# Patient Record
Sex: Female | Born: 1970
Health system: Southern US, Community
[De-identification: ages and names within clinical notes are randomized; demographics above are authoritative.]

## PROBLEM LIST (undated history)

## (undated) DIAGNOSIS — F419 Anxiety disorder, unspecified: Secondary | ICD-10-CM

## (undated) DIAGNOSIS — G629 Polyneuropathy, unspecified: Secondary | ICD-10-CM

## (undated) DIAGNOSIS — J45909 Unspecified asthma, uncomplicated: Secondary | ICD-10-CM

## (undated) DIAGNOSIS — D649 Anemia, unspecified: Secondary | ICD-10-CM

## (undated) DIAGNOSIS — F319 Bipolar disorder, unspecified: Secondary | ICD-10-CM

## (undated) DIAGNOSIS — F329 Major depressive disorder, single episode, unspecified: Secondary | ICD-10-CM

## (undated) DIAGNOSIS — F32A Depression, unspecified: Secondary | ICD-10-CM

## (undated) DIAGNOSIS — I10 Essential (primary) hypertension: Secondary | ICD-10-CM

## (undated) DIAGNOSIS — K219 Gastro-esophageal reflux disease without esophagitis: Secondary | ICD-10-CM

## (undated) DIAGNOSIS — E039 Hypothyroidism, unspecified: Secondary | ICD-10-CM

## (undated) DIAGNOSIS — F259 Schizoaffective disorder, unspecified: Secondary | ICD-10-CM

## (undated) HISTORY — PX: TUBAL LIGATION: SHX77

## (undated) HISTORY — PX: BREAST SURGERY: SHX581

---
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
## 2001-02-18 ENCOUNTER — Emergency Department (HOSPITAL_COMMUNITY): Admission: EM | Admit: 2001-02-18 | Discharge: 2001-02-18 | Payer: Self-pay | Admitting: *Deleted

## 2001-02-18 ENCOUNTER — Encounter: Payer: Self-pay | Admitting: *Deleted

## 2001-02-23 ENCOUNTER — Encounter: Payer: Self-pay | Admitting: *Deleted

## 2001-02-23 ENCOUNTER — Emergency Department (HOSPITAL_COMMUNITY): Admission: EM | Admit: 2001-02-23 | Discharge: 2001-02-23 | Payer: Self-pay | Admitting: *Deleted

## 2001-04-01 ENCOUNTER — Ambulatory Visit (HOSPITAL_COMMUNITY): Admission: RE | Admit: 2001-04-01 | Discharge: 2001-04-01 | Payer: Self-pay | Admitting: Unknown Physician Specialty

## 2001-04-01 ENCOUNTER — Encounter: Payer: Self-pay | Admitting: Unknown Physician Specialty

## 2001-04-07 ENCOUNTER — Emergency Department (HOSPITAL_COMMUNITY): Admission: EM | Admit: 2001-04-07 | Discharge: 2001-04-07 | Payer: Self-pay | Admitting: Emergency Medicine

## 2001-04-07 ENCOUNTER — Encounter: Payer: Self-pay | Admitting: Emergency Medicine

## 2001-06-08 ENCOUNTER — Encounter: Payer: Self-pay | Admitting: Neurology

## 2001-06-08 ENCOUNTER — Ambulatory Visit (HOSPITAL_COMMUNITY): Admission: RE | Admit: 2001-06-08 | Discharge: 2001-06-08 | Payer: Self-pay | Admitting: Neurology

## 2001-07-09 ENCOUNTER — Encounter: Payer: Self-pay | Admitting: *Deleted

## 2001-07-09 ENCOUNTER — Emergency Department (HOSPITAL_COMMUNITY): Admission: EM | Admit: 2001-07-09 | Discharge: 2001-07-09 | Payer: Self-pay | Admitting: *Deleted

## 2001-08-25 ENCOUNTER — Ambulatory Visit (HOSPITAL_COMMUNITY): Admission: RE | Admit: 2001-08-25 | Discharge: 2001-08-25 | Payer: Self-pay | Admitting: General Surgery

## 2001-09-06 ENCOUNTER — Emergency Department (HOSPITAL_COMMUNITY): Admission: EM | Admit: 2001-09-06 | Discharge: 2001-09-06 | Payer: Self-pay | Admitting: Emergency Medicine

## 2001-10-24 ENCOUNTER — Emergency Department (HOSPITAL_COMMUNITY): Admission: EM | Admit: 2001-10-24 | Discharge: 2001-10-24 | Payer: Self-pay | Admitting: Internal Medicine

## 2001-10-31 ENCOUNTER — Emergency Department (HOSPITAL_COMMUNITY): Admission: EM | Admit: 2001-10-31 | Discharge: 2001-10-31 | Payer: Self-pay | Admitting: Internal Medicine

## 2001-11-17 ENCOUNTER — Encounter (HOSPITAL_COMMUNITY): Admission: RE | Admit: 2001-11-17 | Discharge: 2001-12-20 | Payer: Self-pay | Admitting: Orthopaedic Surgery

## 2001-12-16 ENCOUNTER — Emergency Department (HOSPITAL_COMMUNITY): Admission: EM | Admit: 2001-12-16 | Discharge: 2001-12-16 | Payer: Self-pay | Admitting: *Deleted

## 2002-01-04 ENCOUNTER — Encounter: Payer: Self-pay | Admitting: Family Medicine

## 2002-01-04 ENCOUNTER — Ambulatory Visit (HOSPITAL_COMMUNITY): Admission: RE | Admit: 2002-01-04 | Discharge: 2002-01-04 | Payer: Self-pay | Admitting: Nurse Practitioner

## 2002-01-11 ENCOUNTER — Encounter: Payer: Self-pay | Admitting: Orthopaedic Surgery

## 2002-01-11 ENCOUNTER — Ambulatory Visit (HOSPITAL_COMMUNITY): Admission: RE | Admit: 2002-01-11 | Discharge: 2002-01-11 | Payer: Self-pay | Admitting: Orthopaedic Surgery

## 2002-01-28 ENCOUNTER — Encounter: Payer: Self-pay | Admitting: Emergency Medicine

## 2002-01-28 ENCOUNTER — Emergency Department (HOSPITAL_COMMUNITY): Admission: EM | Admit: 2002-01-28 | Discharge: 2002-01-28 | Payer: Self-pay | Admitting: Emergency Medicine

## 2002-01-31 ENCOUNTER — Emergency Department (HOSPITAL_COMMUNITY): Admission: EM | Admit: 2002-01-31 | Discharge: 2002-01-31 | Payer: Self-pay | Admitting: Emergency Medicine

## 2002-01-31 ENCOUNTER — Encounter: Payer: Self-pay | Admitting: Emergency Medicine

## 2002-02-07 ENCOUNTER — Ambulatory Visit (HOSPITAL_COMMUNITY): Admission: RE | Admit: 2002-02-07 | Discharge: 2002-02-07 | Payer: Self-pay | Admitting: Internal Medicine

## 2002-02-15 ENCOUNTER — Ambulatory Visit (HOSPITAL_COMMUNITY): Admission: RE | Admit: 2002-02-15 | Discharge: 2002-02-15 | Payer: Self-pay | Admitting: Family Medicine

## 2002-02-15 ENCOUNTER — Encounter: Payer: Self-pay | Admitting: Family Medicine

## 2002-03-25 ENCOUNTER — Ambulatory Visit (HOSPITAL_COMMUNITY): Admission: RE | Admit: 2002-03-25 | Discharge: 2002-03-25 | Payer: Self-pay

## 2002-10-31 ENCOUNTER — Emergency Department (HOSPITAL_COMMUNITY): Admission: EM | Admit: 2002-10-31 | Discharge: 2002-10-31 | Payer: Self-pay | Admitting: Emergency Medicine

## 2003-03-24 ENCOUNTER — Ambulatory Visit (HOSPITAL_COMMUNITY): Admission: RE | Admit: 2003-03-24 | Discharge: 2003-03-24 | Payer: Self-pay | Admitting: Family Medicine

## 2003-04-01 ENCOUNTER — Emergency Department (HOSPITAL_COMMUNITY): Admission: EM | Admit: 2003-04-01 | Discharge: 2003-04-01 | Payer: Self-pay | Admitting: Emergency Medicine

## 2003-04-05 ENCOUNTER — Ambulatory Visit (HOSPITAL_COMMUNITY): Admission: RE | Admit: 2003-04-05 | Discharge: 2003-04-05 | Payer: Self-pay | Admitting: Orthopedic Surgery

## 2003-05-16 ENCOUNTER — Ambulatory Visit (HOSPITAL_COMMUNITY): Admission: RE | Admit: 2003-05-16 | Discharge: 2003-05-16 | Payer: Self-pay | Admitting: Orthopedic Surgery

## 2004-05-29 ENCOUNTER — Ambulatory Visit (HOSPITAL_COMMUNITY): Admission: RE | Admit: 2004-05-29 | Discharge: 2004-05-29 | Payer: Self-pay | Admitting: General Surgery

## 2004-07-01 ENCOUNTER — Emergency Department (HOSPITAL_COMMUNITY): Admission: EM | Admit: 2004-07-01 | Discharge: 2004-07-01 | Payer: Self-pay | Admitting: Emergency Medicine

## 2004-10-29 ENCOUNTER — Emergency Department (HOSPITAL_COMMUNITY): Admission: EM | Admit: 2004-10-29 | Discharge: 2004-10-29 | Payer: Self-pay | Admitting: Emergency Medicine

## 2004-11-09 ENCOUNTER — Emergency Department (HOSPITAL_COMMUNITY): Admission: EM | Admit: 2004-11-09 | Discharge: 2004-11-10 | Payer: Self-pay | Admitting: Emergency Medicine

## 2005-01-23 ENCOUNTER — Other Ambulatory Visit: Admission: RE | Admit: 2005-01-23 | Discharge: 2005-01-23 | Payer: Self-pay | Admitting: Family Medicine

## 2005-01-23 ENCOUNTER — Ambulatory Visit: Payer: Self-pay | Admitting: Family Medicine

## 2005-08-15 ENCOUNTER — Emergency Department (HOSPITAL_COMMUNITY): Admission: EM | Admit: 2005-08-15 | Discharge: 2005-08-16 | Payer: Self-pay | Admitting: Emergency Medicine

## 2005-09-16 ENCOUNTER — Encounter: Admission: RE | Admit: 2005-09-16 | Discharge: 2005-09-16 | Payer: Self-pay | Admitting: Family Medicine

## 2005-11-09 ENCOUNTER — Emergency Department (HOSPITAL_COMMUNITY): Admission: EM | Admit: 2005-11-09 | Discharge: 2005-11-09 | Payer: Self-pay | Admitting: Emergency Medicine

## 2006-03-17 ENCOUNTER — Emergency Department (HOSPITAL_COMMUNITY): Admission: EM | Admit: 2006-03-17 | Discharge: 2006-03-17 | Payer: Self-pay | Admitting: Emergency Medicine

## 2006-03-23 ENCOUNTER — Ambulatory Visit: Payer: Self-pay | Admitting: Family Medicine

## 2006-07-25 ENCOUNTER — Emergency Department (HOSPITAL_COMMUNITY): Admission: EM | Admit: 2006-07-25 | Discharge: 2006-07-25 | Payer: Self-pay | Admitting: Emergency Medicine

## 2006-07-29 ENCOUNTER — Ambulatory Visit (HOSPITAL_COMMUNITY): Admission: RE | Admit: 2006-07-29 | Discharge: 2006-07-29 | Payer: Self-pay | Admitting: Obstetrics

## 2006-11-20 ENCOUNTER — Ambulatory Visit: Payer: Self-pay | Admitting: Family Medicine

## 2006-11-30 ENCOUNTER — Ambulatory Visit: Payer: Self-pay | Admitting: Family Medicine

## 2007-07-09 ENCOUNTER — Encounter: Admission: RE | Admit: 2007-07-09 | Discharge: 2007-07-09 | Payer: Self-pay | Admitting: Family Medicine

## 2007-09-01 ENCOUNTER — Emergency Department (HOSPITAL_COMMUNITY): Admission: EM | Admit: 2007-09-01 | Discharge: 2007-09-02 | Payer: Self-pay | Admitting: Emergency Medicine

## 2008-03-02 ENCOUNTER — Emergency Department (HOSPITAL_COMMUNITY): Admission: EM | Admit: 2008-03-02 | Discharge: 2008-03-03 | Payer: Self-pay | Admitting: Emergency Medicine

## 2008-03-03 ENCOUNTER — Inpatient Hospital Stay (HOSPITAL_COMMUNITY): Admission: AD | Admit: 2008-03-03 | Discharge: 2008-03-06 | Payer: Self-pay | Admitting: *Deleted

## 2008-03-03 ENCOUNTER — Ambulatory Visit: Payer: Self-pay | Admitting: *Deleted

## 2008-05-15 ENCOUNTER — Emergency Department (HOSPITAL_COMMUNITY): Admission: EM | Admit: 2008-05-15 | Discharge: 2008-05-15 | Payer: Self-pay | Admitting: Emergency Medicine

## 2008-08-22 ENCOUNTER — Emergency Department (HOSPITAL_COMMUNITY): Admission: EM | Admit: 2008-08-22 | Discharge: 2008-08-22 | Payer: Self-pay | Admitting: Emergency Medicine

## 2009-06-01 ENCOUNTER — Emergency Department (HOSPITAL_COMMUNITY): Admission: EM | Admit: 2009-06-01 | Discharge: 2009-06-01 | Payer: Self-pay | Admitting: Emergency Medicine

## 2009-12-26 ENCOUNTER — Emergency Department (HOSPITAL_COMMUNITY): Admission: EM | Admit: 2009-12-26 | Discharge: 2009-12-26 | Payer: Self-pay | Admitting: Emergency Medicine

## 2010-05-30 ENCOUNTER — Ambulatory Visit: Admit: 2010-05-30 | Payer: Self-pay | Admitting: Internal Medicine

## 2010-07-17 ENCOUNTER — Emergency Department (HOSPITAL_COMMUNITY)
Admission: EM | Admit: 2010-07-17 | Discharge: 2010-07-18 | Disposition: A | Discharge status: Home / Self Care | Payer: Self-pay | Attending: Emergency Medicine | Admitting: Emergency Medicine

## 2010-07-17 ENCOUNTER — Emergency Department (HOSPITAL_COMMUNITY): Payer: Self-pay

## 2010-07-17 DIAGNOSIS — Z79899 Other long term (current) drug therapy: Secondary | ICD-10-CM | POA: Insufficient documentation

## 2010-07-17 DIAGNOSIS — E119 Type 2 diabetes mellitus without complications: Secondary | ICD-10-CM | POA: Insufficient documentation

## 2010-07-17 DIAGNOSIS — I10 Essential (primary) hypertension: Secondary | ICD-10-CM | POA: Insufficient documentation

## 2010-07-17 DIAGNOSIS — K5909 Other constipation: Secondary | ICD-10-CM | POA: Insufficient documentation

## 2010-07-17 DIAGNOSIS — Z794 Long term (current) use of insulin: Secondary | ICD-10-CM | POA: Insufficient documentation

## 2010-07-17 DIAGNOSIS — R109 Unspecified abdominal pain: Secondary | ICD-10-CM | POA: Insufficient documentation

## 2010-07-17 LAB — DIFFERENTIAL
Basophils Absolute: 0 10*3/uL (ref 0.0–0.1)
Basophils Relative: 0 % (ref 0–1)
Eosinophils Absolute: 0.3 10*3/uL (ref 0.0–0.7)
Eosinophils Relative: 4 % (ref 0–5)
Lymphocytes Relative: 42 % (ref 12–46)
Lymphs Abs: 3.4 10*3/uL (ref 0.7–4.0)
Monocytes Absolute: 0.4 10*3/uL (ref 0.1–1.0)
Monocytes Relative: 5 % (ref 3–12)
Neutro Abs: 4 10*3/uL (ref 1.7–7.7)
Neutrophils Relative %: 49 % (ref 43–77)

## 2010-07-17 LAB — URINALYSIS, ROUTINE W REFLEX MICROSCOPIC
Bilirubin Urine: NEGATIVE
Hgb urine dipstick: NEGATIVE
Nitrite: NEGATIVE
Protein, ur: NEGATIVE mg/dL
Specific Gravity, Urine: >1.03 — ABNORMAL HIGH (ref 1.005–1.030)
Urine Glucose, Fasting: NEGATIVE mg/dL
Urobilinogen, UA: 0.2 mg/dL (ref 0.0–1.0)
pH: 5.5 (ref 5.0–8.0)

## 2010-07-17 LAB — CBC
HCT: 30.8 % — ABNORMAL LOW (ref 36.0–46.0)
Hemoglobin: 10.4 g/dL — ABNORMAL LOW (ref 12.0–15.0)
MCH: 28.4 pg (ref 26.0–34.0)
MCHC: 33.8 g/dL (ref 30.0–36.0)
MCV: 84.2 fL (ref 78.0–100.0)
Platelets: 308 10*3/uL (ref 150–400)
RBC: 3.66 MIL/uL — ABNORMAL LOW (ref 3.87–5.11)
RDW: 14.7 % (ref 11.5–15.5)
WBC: 8.1 10*3/uL (ref 4.0–10.5)

## 2010-07-17 LAB — BASIC METABOLIC PANEL
BUN: 9 mg/dL (ref 6–23)
CO2: 27 mEq/L (ref 19–32)
Calcium: 8.7 mg/dL (ref 8.4–10.5)
Chloride: 103 mEq/L (ref 96–112)
Creatinine, Ser: 0.91 mg/dL (ref 0.4–1.2)
GFR calc Af Amer: >60 mL/min (ref >60)
GFR calc non Af Amer: >60 mL/min (ref >60)
Glucose, Bld: 187 mg/dL — ABNORMAL HIGH (ref 70–99)
Potassium: 3.5 mEq/L (ref 3.5–5.1)
Sodium: 140 mEq/L (ref 135–145)

## 2010-07-17 LAB — WET PREP, GENITAL
Trich, Wet Prep: NONE SEEN
Yeast Wet Prep HPF POC: NONE SEEN

## 2010-07-17 LAB — POCT PREGNANCY, URINE: Preg Test, Ur: NEGATIVE

## 2010-07-19 LAB — GC/CHLAMYDIA PROBE AMP, GENITAL
Chlamydia, DNA Probe: NEGATIVE
GC Probe Amp, Genital: NEGATIVE

## 2010-08-04 ENCOUNTER — Emergency Department (HOSPITAL_COMMUNITY)
Admission: EM | Admit: 2010-08-04 | Discharge: 2010-08-05 | Disposition: A | Discharge status: Home / Self Care | Payer: Medicare Other | Attending: Emergency Medicine | Admitting: Emergency Medicine

## 2010-08-04 DIAGNOSIS — F411 Generalized anxiety disorder: Secondary | ICD-10-CM | POA: Insufficient documentation

## 2010-08-04 DIAGNOSIS — M549 Dorsalgia, unspecified: Secondary | ICD-10-CM | POA: Insufficient documentation

## 2010-08-04 DIAGNOSIS — K589 Irritable bowel syndrome without diarrhea: Secondary | ICD-10-CM | POA: Insufficient documentation

## 2010-08-04 DIAGNOSIS — Z79899 Other long term (current) drug therapy: Secondary | ICD-10-CM | POA: Insufficient documentation

## 2010-08-04 DIAGNOSIS — E1169 Type 2 diabetes mellitus with other specified complication: Secondary | ICD-10-CM | POA: Insufficient documentation

## 2010-08-04 LAB — DIFFERENTIAL
Basophils Absolute: 0 10*3/uL (ref 0.0–0.1)
Basophils Relative: 1 % (ref 0–1)
Eosinophils Absolute: 0.2 10*3/uL (ref 0.0–0.7)
Eosinophils Relative: 3 % (ref 0–5)
Lymphocytes Relative: 39 % (ref 12–46)
Lymphs Abs: 2.9 10*3/uL (ref 0.7–4.0)
Monocytes Absolute: 0.4 10*3/uL (ref 0.1–1.0)
Monocytes Relative: 6 % (ref 3–12)
Neutro Abs: 3.8 10*3/uL (ref 1.7–7.7)
Neutrophils Relative %: 52 % (ref 43–77)

## 2010-08-04 LAB — BASIC METABOLIC PANEL
BUN: 9 mg/dL (ref 6–23)
CO2: 23 mEq/L (ref 19–32)
Calcium: 9 mg/dL (ref 8.4–10.5)
Chloride: 97 mEq/L (ref 96–112)
Creatinine, Ser: 1.06 mg/dL (ref 0.4–1.2)
GFR calc Af Amer: >60 mL/min (ref >60)
GFR calc non Af Amer: 58 mL/min — ABNORMAL LOW (ref >60)
Glucose, Bld: 258 mg/dL — ABNORMAL HIGH (ref 70–99)
Potassium: 3.4 mEq/L — ABNORMAL LOW (ref 3.5–5.1)
Sodium: 133 mEq/L — ABNORMAL LOW (ref 135–145)

## 2010-08-04 LAB — CBC
HCT: 32.7 % — ABNORMAL LOW (ref 36.0–46.0)
Hemoglobin: 11.1 g/dL — ABNORMAL LOW (ref 12.0–15.0)
MCH: 28.5 pg (ref 26.0–34.0)
MCHC: 33.9 g/dL (ref 30.0–36.0)
MCV: 84.1 fL (ref 78.0–100.0)
Platelets: 305 10*3/uL (ref 150–400)
RBC: 3.89 MIL/uL (ref 3.87–5.11)
RDW: 14.2 % (ref 11.5–15.5)
WBC: 7.4 10*3/uL (ref 4.0–10.5)

## 2010-08-04 LAB — GLUCOSE, CAPILLARY
Glucose-Capillary: 263 mg/dL — ABNORMAL HIGH (ref 70–99)
Glucose-Capillary: 257 mg/dL — ABNORMAL HIGH (ref 70–99)

## 2010-08-09 LAB — HEPATIC FUNCTION PANEL
ALT: 33 U/L (ref 0–35)
AST: 21 U/L (ref 0–37)
Albumin: 3.8 g/dL (ref 3.5–5.2)
Alkaline Phosphatase: 81 U/L (ref 39–117)
Bilirubin, Direct: 0.1 mg/dL (ref 0.0–0.3)
Indirect Bilirubin: 0.6 mg/dL (ref 0.3–0.9)
Total Bilirubin: 0.7 mg/dL (ref 0.3–1.2)
Total Protein: 7 g/dL (ref 6.0–8.3)

## 2010-08-09 LAB — URINALYSIS, ROUTINE W REFLEX MICROSCOPIC
Bilirubin Urine: NEGATIVE
Glucose, UA: NEGATIVE mg/dL
Hgb urine dipstick: NEGATIVE
Ketones, ur: NEGATIVE mg/dL
Nitrite: NEGATIVE
Protein, ur: NEGATIVE mg/dL
Specific Gravity, Urine: 1.03 (ref 1.005–1.030)
Urobilinogen, UA: 0.2 mg/dL (ref 0.0–1.0)
pH: 5 (ref 5.0–8.0)

## 2010-08-09 LAB — WET PREP, GENITAL
Trich, Wet Prep: NONE SEEN
Yeast Wet Prep HPF POC: NONE SEEN

## 2010-08-09 LAB — DIFFERENTIAL
Basophils Absolute: 0 10*3/uL (ref 0.0–0.1)
Basophils Relative: 0 % (ref 0–1)
Eosinophils Absolute: 0.3 10*3/uL (ref 0.0–0.7)
Eosinophils Relative: 4 % (ref 0–5)
Lymphocytes Relative: 40 % (ref 12–46)
Lymphs Abs: 2.5 10*3/uL (ref 0.7–4.0)
Monocytes Absolute: 0.5 10*3/uL (ref 0.1–1.0)
Monocytes Relative: 8 % (ref 3–12)
Neutro Abs: 3.1 10*3/uL (ref 1.7–7.7)
Neutrophils Relative %: 48 % (ref 43–77)

## 2010-08-09 LAB — CBC
HCT: 33.4 % — ABNORMAL LOW (ref 36.0–46.0)
Hemoglobin: 11.3 g/dL — ABNORMAL LOW (ref 12.0–15.0)
MCH: 28.8 pg (ref 26.0–34.0)
MCHC: 33.9 g/dL (ref 30.0–36.0)
MCV: 85 fL (ref 78.0–100.0)
Platelets: 251 10*3/uL (ref 150–400)
RBC: 3.93 MIL/uL (ref 3.87–5.11)
RDW: 13.6 % (ref 11.5–15.5)
WBC: 6.4 10*3/uL (ref 4.0–10.5)

## 2010-08-09 LAB — BASIC METABOLIC PANEL
BUN: 12 mg/dL (ref 6–23)
CO2: 26 mEq/L (ref 19–32)
Calcium: 9.3 mg/dL (ref 8.4–10.5)
Chloride: 104 mEq/L (ref 96–112)
Creatinine, Ser: 0.84 mg/dL (ref 0.4–1.2)
GFR calc Af Amer: >60 mL/min (ref >60)
GFR calc non Af Amer: >60 mL/min (ref >60)
Glucose, Bld: 229 mg/dL — ABNORMAL HIGH (ref 70–99)
Potassium: 3.5 mEq/L (ref 3.5–5.1)
Sodium: 138 mEq/L (ref 135–145)

## 2010-08-09 LAB — GC/CHLAMYDIA PROBE AMP, GENITAL
Chlamydia, DNA Probe: NEGATIVE
GC Probe Amp, Genital: NEGATIVE

## 2010-08-09 LAB — GLUCOSE, CAPILLARY: Glucose-Capillary: 249 mg/dL — ABNORMAL HIGH (ref 70–99)

## 2010-08-09 LAB — PREGNANCY, URINE: Preg Test, Ur: NEGATIVE

## 2010-08-10 LAB — DIFFERENTIAL
Basophils Absolute: 0 10*3/uL (ref 0.0–0.1)
Basophils Relative: 1 % (ref 0–1)
Eosinophils Absolute: 0.1 10*3/uL (ref 0.0–0.7)
Eosinophils Relative: 2 % (ref 0–5)
Lymphocytes Relative: 39 % (ref 12–46)
Lymphs Abs: 2 10*3/uL (ref 0.7–4.0)
Monocytes Absolute: 0.3 10*3/uL (ref 0.1–1.0)
Monocytes Relative: 6 % (ref 3–12)
Neutro Abs: 2.7 10*3/uL (ref 1.7–7.7)
Neutrophils Relative %: 52 % (ref 43–77)

## 2010-08-10 LAB — CBC
HCT: 39.2 % (ref 36.0–46.0)
Hemoglobin: 13.1 g/dL (ref 12.0–15.0)
MCHC: 33.3 g/dL (ref 30.0–36.0)
MCV: 82.9 fL (ref 78.0–100.0)
Platelets: 247 10*3/uL (ref 150–400)
RBC: 4.72 MIL/uL (ref 3.87–5.11)
RDW: 15 % (ref 11.5–15.5)
WBC: 5.2 10*3/uL (ref 4.0–10.5)

## 2010-08-10 LAB — BASIC METABOLIC PANEL
BUN: 7 mg/dL (ref 6–23)
CO2: 28 mEq/L (ref 19–32)
Calcium: 9.5 mg/dL (ref 8.4–10.5)
Chloride: 97 mEq/L (ref 96–112)
Creatinine, Ser: 0.99 mg/dL (ref 0.4–1.2)
GFR calc Af Amer: >60 mL/min (ref >60)
GFR calc non Af Amer: >60 mL/min (ref >60)
Glucose, Bld: 539 mg/dL — ABNORMAL HIGH (ref 70–99)
Potassium: 3.7 mEq/L (ref 3.5–5.1)
Sodium: 134 mEq/L — ABNORMAL LOW (ref 135–145)

## 2010-08-10 LAB — URINALYSIS, ROUTINE W REFLEX MICROSCOPIC
Bilirubin Urine: NEGATIVE
Glucose, UA: >1000 mg/dL — AB
Hgb urine dipstick: NEGATIVE
Ketones, ur: NEGATIVE mg/dL
Leukocytes, UA: NEGATIVE
Nitrite: NEGATIVE
Protein, ur: NEGATIVE mg/dL
Specific Gravity, Urine: 1.01 (ref 1.005–1.030)
Urobilinogen, UA: 0.2 mg/dL (ref 0.0–1.0)
pH: 5.5 (ref 5.0–8.0)

## 2010-08-10 LAB — URINE MICROSCOPIC-ADD ON

## 2010-08-10 LAB — GLUCOSE, CAPILLARY
Glucose-Capillary: 211 mg/dL — ABNORMAL HIGH (ref 70–99)
Glucose-Capillary: 220 mg/dL — ABNORMAL HIGH (ref 70–99)
Glucose-Capillary: 299 mg/dL — ABNORMAL HIGH (ref 70–99)
Glucose-Capillary: 476 mg/dL — ABNORMAL HIGH (ref 70–99)

## 2010-08-10 LAB — HEMOGLOBIN A1C
Hgb A1c MFr Bld: 11.8 % — ABNORMAL HIGH (ref 4.6–6.1)
Mean Plasma Glucose: 292 mg/dL

## 2010-08-14 ENCOUNTER — Ambulatory Visit (INDEPENDENT_AMBULATORY_CARE_PROVIDER_SITE_OTHER): Payer: Self-pay | Admitting: Internal Medicine

## 2010-10-08 NOTE — Discharge Summary (Signed)
NAMELUKISHA, [name] NO.:  [id number]   MEDICAL RECORD NO.:  [id number]          PATIENT TYPE:  IPS   LOCATION:  0301                          FACILITY:  [facility]   PHYSICIAN:  Jasmine Pang, M.D. DATE OF BIRTH:  [birth year]   DATE OF ADMISSION:  03/03/2008  DATE OF DISCHARGE:  03/06/2008                               DISCHARGE SUMMARY   IDENTIFYING INFORMATION:  This is a 40 year old married African American  female who was admitted on March 03, 2008.   HISTORY OF PRESENT ILLNESS:  The patient stated she was having auditory  hallucinations to jump off a bridge.  She was irritable, flat, and  depressed.  She has been noncompliant with her medications.  She was  discontinued from [facility] on the day before admission to our unit.  Last night, she had visual hallucinations of people in her room.  She  had auditory hallucinations of people calling her name.   PAST PSYCHIATRIC HISTORY:  This is the first [facility] admission for the  patient.  She was inpatient at [facility] from February 23, 2008 to  February 29, 2008.  She goes outpatient at [facility] in [facility].   FAMILY HISTORY:  Her mother has alcohol abuse.   ALCOHOL AND DRUG HISTORY:  The patient denies.   MEDICAL PROBLEMS:  Back pain and irritable bowel syndrome.   MEDICATIONS:  1. Cymbalta 30 mg p.o. b.i.d.  2. Abilify 10 mg daily.  3. Vicodin every 4 hours p.r.n.  4. Flexeril 10 mg p.o. b.i.d. p.r.n.  5. Naproxen 500 mg p.o. b.i.d.   DRUG ALLERGIES:  No known drug allergies.   PHYSICAL FINDINGS:  There were no acute physical or medical problems  noted.   ADMISSION LABORATORIES:  Glucose was increased at 116 and platelets were  increased at 428,000.   HOSPITAL COURSE:  Upon admission, the patient was started on Ambien 10  mg p.o. q.h.s. p.r.n. insomnia.  She was also restarted on Cymbalta 30  mg p.o. b.i.d., Abilify 10 mg daily, Vicodin 325 mg p.o. q.4 h. P.r.n.  pain, Flexeril 10 mg p.o.  b.i.d. p.r.n. pain, and Naprosyn 500 mg p.o.  b.i.d.  She was also started on Ativan 1 mg every 6 hours p.r.n.  hallucinations, anxiety, or agitation.  On March 05, 2008, Ativan was  decreased to 0.5 mg p.o. q.8 h. p.r.n. anxiety.  She was also given  magnesium citrate for constipation.  The patient tolerated these  medications well with no significant side effects.  Initially in  individual sessions, she was guarded and withdrawn.  She had limited eye  contact.  There is positive auditory hallucinations, positive suicidal  ideation, but no plan.  Speech was slow.  There was positive psychomotor  slowing.  Insight and judgment were fair.  She was felt to have a major  depressive disorder with psychosis, rule out bipolar disorder.  She did  not want to participate in unit therapeutic groups and activities at  first; however, this improved as hospitalization progressed.  On March 05, 2008, her suicidal ideation was resolving.  Hallucinations were  resolving.  Mood was still depressed.  Affect constricted.  She was  isolated and withdrawn.  On March 06, 2008, mental status had improved  markedly.  Sleep was good and appetite was improving.  Mood was less  depressed and less anxious.  Affect was consistent with mood.  There was  no suicidal or homicidal ideation.  No thoughts of self-injurious  behavior.  No auditory or visual hallucinations.  No paranoia or  delusions.  Thoughts were logical and goal-directed.  Thought content,  no predominant theme.  Cognitive was grossly intact.  Insight good.  Judgment good.  Impulse control was good.  It was felt the patient was  safe for discharge and she requested to go home.   DISCHARGE DIAGNOSES:  Axis I: Mood disorder, not otherwise specified  (rule out bipolar disorder).  Axis II: None.  Axis III: Back pain and irritable bowel syndrome.  Axis IV: Severe (problems with primary support group, occupational  problem, economic problem, burden  of psychiatric illness, burden of  medical problems.)  Axis V: Global assessment of functioning was 50 upon discharge.  GAF 38  upon admission.  GAF highest past year was 60.   DISCHARGE PLANS:  There was no specific activity level or dietary  restriction.   POSTHOSPITAL CARE PLANS:  The patient will go to [facility] [address] on  March 08, 2008, at 8 o'clock a.m.   DISCHARGE MEDICATIONS:  1. Cymbalta 1 pill 30 mg in the morning at 30 mg in the evening.  2. Abilify 10 mg 1 pill daily.  3. Naproxen 500 mg 1 pill in the morning and 1 pill in the evening.  4. It was recommended that she follow up with her family doctor for      refill on Flexeril and the pain medications such as Vicodin.      Jasmine Pang, M.D.  Electronically Signed     BHS/MEDQ  D:  03/06/2008  T:  03/07/2008  Job:  [id number]

## 2010-10-11 NOTE — Op Note (Signed)
NAME:  Kathryn [name], Kathryn [name]                          ACCOUNT NO.:  [id number]   MEDICAL RECORD NO.:  [id number]                   PATIENT TYPE:  AMB   LOCATION:  DAY                                  FACILITY:  [facility]   PHYSICIAN:  Lionel December, M.D.                 DATE OF BIRTH:  1971-03-21   DATE OF PROCEDURE:  02/07/2002  DATE OF DISCHARGE:                                 OPERATIVE REPORT   PROCEDURE:  Esophagogastroduodenoscopy followed by sequential dilatation.   ENDOSCOPIST:  Lionel December, M.D.   INDICATIONS:  This patient is [name] 40 year old African-American female with  epigastric and left-sided abdominal pain.  She has also some nausea.  Recent  CT had shown [name] scant amount of fluid in the left pericolic gutter and  mesenteric thickening, but no abnormality was noted to the colon.  I noted  the patient was on 2 different NSAIDs.  She was on Celebrex as well as  ibuprofen.  I wondered about NSAID use with peptic ulcer disease as well as  small or large bowel injury.  She told me she was better with the Cipro and  Flagyl.  She was asked to continue therapy and today is undergoing EGD.  I  also asked her to stop her ibuprofen and have maintained her on Celebrex and  she was begun on Celebrex.  The procedure and risks were reviewed with the  patient and informed consent was obtained.   PREOPERATIVE MEDICATIONS:  Cetacaine spray for pharyngeal topical  anesthesia, Demerol 50 mg IV and Versed 6 mg IV in divided dose.   INSTRUMENT:  Olympus video system.   FINDINGS:  Procedure performed in endoscopy suite.  The patient's vital  signs and O2 saturation were monitored during the procedure and remained  stable.  The patient was placed in the left lateral recumbent position and  endoscope was passed via the oropharynx without any difficulty into the  esophagus.   ESOPHAGUS:  Mucosa of the esophagus was normal throughout.  Squamocolumnar  junction was unremarkable.  No hernia was  noted.   STOMACH:  It was empty and distended very well with insufflation.  The folds  of the proximal stomach were normal.  Examination of the mucosa was normal  except there was [name] tiny erosion at the pyloric channel.  The pyloric channel  was wide open.  The angularis and fundus were examined by retroflexing the  scope and were normal.   DUODENUM:  Examination of the bulb and second part duodenum was normal.   The endoscope was withdrawn.  The patient tolerated the procedure well.   FINAL DIAGNOSIS:  Single erosion at pyloric channel otherwise normal EGD.  No evidence of peptic ulcer disease.    RECOMMENDATIONS:  1. I will ask her to discontinue Celebrex.  For the time being she can take     hydrocodone with APAP for pain control (back).  2. We will proceed with stool studies for wbc's, cultures ________.  3. Bentyl 20 mg t.i.d.  4. If stool studies are negative and she remains with these symptoms will     consider [name] total colonoscopy.                                               Lionel December, M.D.    NR/MEDQ  D:  02/07/2002  T:  02/07/2002  Job:  [id number]   cc:   Tillman Abide, NP  [facility] Department

## 2010-10-11 NOTE — Op Note (Signed)
   NAME:  [name], Kathryn A                          ACCOUNT NO.:  [id number]   MEDICAL RECORD NO.:  [id number]                   PATIENT TYPE:  AMB   LOCATION:  DAY                                  FACILITY:  [facility]   PHYSICIAN:  Vickki Hearing, M.D.           DATE OF BIRTH:  1970/08/31   DATE OF PROCEDURE:  04/05/2003  DATE OF DISCHARGE:                                 OPERATIVE REPORT   PREOPERATIVE DIAGNOSIS:  Carpal tunnel syndrome, left wrist.   POSTOPERATIVE DIAGNOSIS:  Carpal tunnel syndrome, left wrist.   PROCEDURE:  Open left carpal tunnel release.   SURGEON:  Vickki Hearing, M.D.   ANESTHESIA:  Anesthetic block.   FINDINGS:  Tight stenotic carpal tunnel with compression of the median  nerve.   INDICATIONS FOR PROCEDURE:  This patient is a 40 year old female with  burning, paresthesias, and pain in the left upper extremity consistent with  carpal tunnel syndrome who presented for carpal tunnel release.   TECHNIQUE:  The patient was identified as Kathryn [name].  Her left hand was  marked for surgery.  My initials were placed over the mark.  The patient  confirmed the operative site.  The chart, including the consent and history  and physical correlated with the operative site and procedure.  She was  given Ancef and taken to the operating room for a block of her left upper  extremity.  We took a time out.  All present agreed that the patient was  Kathryn [name] and that the left upper extremity was the operative site and  that she was to have an open left carpal tunnel release.  We proceeded with  a sterile prep and drape.   We made an incision over the carpal tunnel and divided that down to the  fascia.  The palmar fascia was incised.  The transverse carpal ligament was  identified and released from distal to proximal.  The carpal tunnel was  inspected and found to be free of any ganglion cysts.  Other than stenosis  of the carpal tunnel, the median nerve had mild  to moderate pressure on it.  We irrigated and closed the wound with 3-0 nylon interrupted suture.  We  injected 0.5% Sensorcaine, 10 cc.  We placed sterile dressings and released  the tourniquet.   The patient was taken back to the recovery room in stable condition.      ___________________________________________                                            Vickki Hearing, M.D.   SEH/MEDQ  D:  04/05/2003  T:  04/05/2003  Job:  [id number]

## 2010-10-11 NOTE — H&P (Signed)
[facility]  Patient:    Kathryn [name], Kathryn [name] Visit Number: [id number] MRN: [id number]          Service Type: EMS Location: ED Attending Physician:  Rosalyn Charters Dictated by:   Franky Macho, M.D. Admit Date:  07/09/2001 Discharge Date: 07/09/2001   CC:         Franky Macho, M.D.  Patients chart  Health Department   History and Physical  DATE OF BIRTH: 1970/10/25.  CHIEF COMPLAINT:  Right breast mass.  HISTORY OF PRESENT ILLNESS:  The patient is a 40 year old black female who presents with a persistent tender knot in the right breast.  It was first felt one month ago.  It has not resolved.  No fevers, chills, nipple discharge, or family history of breast carcinoma.  She was first pregnant at 38, has three children, never breast-fed.  PAST MEDICAL HISTORY:  Sinus problems.  PAST SURGICAL HISTORY:  C-section, tubal ligation.  CURRENT MEDICATIONS:  Baclofen.  ALLERGIES:  No known drug allergies.  REVIEW OF SYSTEMS:  The patient denies drinking or smoking.  She denies any other cardiopulmonary difficulties or bleeding disorders.  PHYSICAL EXAMINATION:  GENERAL:  The patient is a well-developed, well-nourished black female in no acute distress.  VITAL SIGNS:  She is afebrile.  Vital signs are stable.  NECK:  Supple without lymphadenopathy.  LUNGS:  Clear to auscultation with equal breath sounds bilaterally.  HEART:  Regular rate and rhythm without S3, S4, or murmur.  BREASTS:  Right breast examination reveals a small, ovoid mass at the 2 oclock position.  Tender to touch.  No nipple discharge or dimpling is noted. The axilla is negative for palpable nodes.  Left breast examination is unremarkable.  IMPRESSION:  Right breast mass.  PLAN:  The patient is scheduled for right breast biopsy on August 25, 2001.  The risks and benefits of the procedure including bleeding and infection were fully explained to the patient and she gave  informed consent. Dictated by:   Franky Macho, M.D. Attending Physician:  Rosalyn Charters DD:  08/24/01 TD:  08/24/01 Job: [id number]
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
## 2010-10-11 NOTE — Op Note (Signed)
NAME:  Kathryn [name], Kathryn [name]                          ACCOUNT NO.:  [id number]   MEDICAL RECORD NO.:  [id number]                   PATIENT TYPE:  AMB   LOCATION:  DAY                                  FACILITY:  [facility]   PHYSICIAN:  Lionel December, M.D.                 DATE OF BIRTH:  1970-12-26   DATE OF PROCEDURE:  03/25/2002  DATE OF DISCHARGE:                                 OPERATIVE REPORT   PROCEDURE:  Attempted colonoscopy.  It was incomplete secondary to poor  prep.   ENDOSCOPIST:  Lionel December, M.D.   INDICATIONS:  This patient is [name] 40 year old African-American female who was  seen initially last month with epigastric and left-sided abdominal pain and  nonbloody diarrhea.  CT showed scant amount of fluid in the left pericolic  gutter and some mesentery thickening, but there was no evidence of  diverticulitis.  She was treated with Cipro and Flagyl.  She also underwent  EGD because of dysphagia and epigastric pain.  She had [name] single erosion at  the pyloric cannel.  Her diarrhea did not improve with Cipro and Flagyl.  Stool studies were screened and these were also normal.  She is, therefore,  returning for colonoscopy. She tells me that for the last few days she has  been constipated.  She did not have good results with GoLYTELY prep.  The  patient took prep and she was therefore given 3 tap water emeses in this  facility.  The procedures and risks were reviewed with the patient and  informed consent was obtained.   PREOPERATIVE MEDICATIONS:  Demerol 50 mg IV and Versed 6 mg IV in divided  doses.   INSTRUMENT:  Olympus video system.   FINDINGS:  Procedure performed in endoscopy suite.  The patient's vital  signs and O2 saturation were monitored during the procedure and remained  stable.  The patient was placed in the left lateral recumbent position and  rectal examination was performed.  This was within normal limits.   The scope was placed in the rectum and advanced under  vision into the  sigmoid colon and beyond.  Preparation was satisfactory to descending colon.  She had formed as well as [name] lot of thick liquid stool in the splenic flexure  and also in the transverse colon.  After vigorously washing the colon I was  able to pass the scope to mid or proximal transverse colon where everything  turned black and I could not see anything.  Therefore, I could not complete  the exam.  As the scope was withdrawn the mucosa was once again carefully  examined.  No abnormality noted to the mucosa of the descending, sigmoid  colon, and rectum.   The scope was retroflexed to examine the anorectal junction.  There was [name]  focal area of erythema covered with fresh blood. This was felt to be related  to trauma  either from the [facility]' enema or tap water enemas.  The endoscope  was straightened and withdrawn.  The patient tolerated the procedure well.   FINAL DIAGNOSES:  1. No evidence of colitis involving the sigmoid or descending colon.  2. Right colon could not be examined because of poor prep.   RECOMMENDATIONS:  Will start her on Citrucel 1 tablespoonful daily and  lactulose 2 tablespoonfuls daily and use Levsin SL on [name] p.r.n. basis.  She  will return for OV in 3-4 weeks.                                               Lionel December, M.D.    NR/MEDQ  D:  03/25/2002  T:  03/25/2002  Job:  [id number]

## 2010-10-11 NOTE — Op Note (Signed)
[facility]  Patient:    Kathryn [name], Kathryn [name] Visit Number: [id number] MRN: [id number]          Service Type: EMS Location: ED Attending Physician:  Rosalyn Charters Dictated by:   Franky Macho, M.D. Proc. Date: 08/25/01 Admit Date:  07/09/2001 Discharge Date: 07/09/2001   CC:         Health Department   Operative Report  PATIENT AGE:  40 years old.  PREOPERATIVE DIAGNOSIS:  Right breast mass.  POSTOPERATIVE DIAGNOSIS:  Right breast mass.  OPERATION:  Right breast biopsy.  SURGEON:  Franky Macho, M.D.  ANESTHESIA:  MAC.  INDICATIONS:  The patient is a 40 year old black female who presents with a tender, dominant mass in the right breast.  It is at the 2 oclock position. The risks and benefits of the procedure including bleeding and infection were fully explained to the patient who gave informed consent.  DESCRIPTION OF PROCEDURE:  The patient was placed in the supine position. After anesthesia was administered, the right breast was prepped and draped using the usual sterile technique with Betadine.  An incision was made over the mass after 1% Xylocaine was used for local anesthesia.  The mass was excised without difficulty.  Adenosis was noted in the area.  The mass was sent to pathology for further examination.  Any bleeding was controlled using Bovie electrocautery.  The incision was closed using a 4-0 Vicryl subcuticular suture.  Steri-Strips and dry sterile dressing were applied.  All tape and needle counts were correct at the end of the procedure.  The patient was transferred to the day surgery in stable condition.  COMPLICATIONS:  None.  SPECIMEN:   Right breast biopsy.  ESTIMATED BLOOD LOSS:  Minimal. Dictated by:   Franky Macho, M.D. Attending Physician:  Rosalyn Charters DD:  08/25/01 TD:  08/26/01 Job: [id number]

## 2010-10-11 NOTE — Op Note (Signed)
NAME:  [name], Kathryn A                          ACCOUNT NO.:  [id number]   MEDICAL RECORD NO.:  [id number]                   PATIENT TYPE:  AMB   LOCATION:  DAY                                  FACILITY:  [facility]   PHYSICIAN:  Vickki Hearing, M.D.           DATE OF BIRTH:  02/27/1971   DATE OF PROCEDURE:  05/16/2003  DATE OF DISCHARGE:                                 OPERATIVE REPORT   INDICATIONS FOR PROCEDURE:  Pain and paresthesias, right upper extremity,  consistent with carpal tunnel syndrome.   PREOPERATIVE DIAGNOSIS:  Carpal tunnel syndrome, right wrist.   POSTOPERATIVE DIAGNOSIS:  Carpal tunnel syndrome, right wrist, consisting of  tendon sheath, flexor carpi radialis tendon.   PROCEDURE:  Carpal tunnel release and removal of two ganglion cysts.   SURGEON:  Vickki Hearing, M.D.   ANESTHESIA:  Bier block.   FINDINGS:  Severe stenosis of the carpal tunnel with scarring around the  median nerve with compression and two ganglion cysts of the flexor carpi  radialis tendon.   DESCRIPTION OF PROCEDURE:  Kathryn [name] was identified in the holding area by  arm band.  Her medical record was checked.  She was scheduled to have a  right carpal tunnel release, and I signed my initials over a mark that she  had placed on her arm.  I gave her a gram of Ancef and took her to the  operating room where she had a Bier block.  We did a time out and confirmed  that the patient was Kathryn [name] and that her right upper extremity was to  have a carpal tunnel release.   After sterile prep and drape, we continued with a volar incision over the  carpal tunnel, divided down to the transverse carpal ligament, released it,  and carried the incision proximally and decompressed two ganglion cysts of  the tendon sheath. We irrigated the wound and obtained hemostasis of a very  venous backflow blood-stained wound.  We closed with 3-0 nylon suture and  injected 10 cc of 0.5% Marcaine.  We placed  sterile dressings, released the  tourniquet, and took her to the recovery room where she will have routine  PACU procedure.  She will be discharged to follow up in two to three days  for a dressing change.      ___________________________________________                                            Vickki Hearing, M.D.   SEH/MEDQ  D:  05/16/2003  T:  05/16/2003  Job:  [id number]

## 2010-11-14 ENCOUNTER — Emergency Department (HOSPITAL_COMMUNITY): Payer: Medicare Other

## 2010-11-14 ENCOUNTER — Emergency Department (HOSPITAL_COMMUNITY)
Admission: EM | Admit: 2010-11-14 | Discharge: 2010-11-14 | Disposition: A | Discharge status: Home / Self Care | Payer: Medicare Other | Attending: Emergency Medicine | Admitting: Emergency Medicine

## 2010-11-14 DIAGNOSIS — S40019A Contusion of unspecified shoulder, initial encounter: Secondary | ICD-10-CM | POA: Insufficient documentation

## 2010-11-14 DIAGNOSIS — W19XXXA Unspecified fall, initial encounter: Secondary | ICD-10-CM | POA: Insufficient documentation

## 2010-11-14 DIAGNOSIS — K589 Irritable bowel syndrome without diarrhea: Secondary | ICD-10-CM | POA: Insufficient documentation

## 2010-11-14 DIAGNOSIS — S20219A Contusion of unspecified front wall of thorax, initial encounter: Secondary | ICD-10-CM | POA: Insufficient documentation

## 2010-11-14 DIAGNOSIS — M549 Dorsalgia, unspecified: Secondary | ICD-10-CM | POA: Insufficient documentation

## 2010-11-14 DIAGNOSIS — Z7982 Long term (current) use of aspirin: Secondary | ICD-10-CM | POA: Insufficient documentation

## 2010-11-14 DIAGNOSIS — F319 Bipolar disorder, unspecified: Secondary | ICD-10-CM | POA: Insufficient documentation

## 2010-11-14 DIAGNOSIS — E119 Type 2 diabetes mellitus without complications: Secondary | ICD-10-CM | POA: Insufficient documentation

## 2010-11-24 ENCOUNTER — Emergency Department (HOSPITAL_COMMUNITY)
Admission: EM | Admit: 2010-11-24 | Discharge: 2010-11-24 | Disposition: A | Discharge status: Home / Self Care | Payer: Medicare Other | Attending: Emergency Medicine | Admitting: Emergency Medicine

## 2010-11-24 DIAGNOSIS — F411 Generalized anxiety disorder: Secondary | ICD-10-CM | POA: Insufficient documentation

## 2010-11-24 DIAGNOSIS — Z79899 Other long term (current) drug therapy: Secondary | ICD-10-CM | POA: Insufficient documentation

## 2010-11-24 DIAGNOSIS — R3589 Other polyuria: Secondary | ICD-10-CM | POA: Insufficient documentation

## 2010-11-24 DIAGNOSIS — R358 Other polyuria: Secondary | ICD-10-CM | POA: Insufficient documentation

## 2010-11-24 DIAGNOSIS — F319 Bipolar disorder, unspecified: Secondary | ICD-10-CM | POA: Insufficient documentation

## 2010-11-24 DIAGNOSIS — E876 Hypokalemia: Secondary | ICD-10-CM | POA: Insufficient documentation

## 2010-11-24 DIAGNOSIS — R5381 Other malaise: Secondary | ICD-10-CM | POA: Insufficient documentation

## 2010-11-24 DIAGNOSIS — R631 Polydipsia: Secondary | ICD-10-CM | POA: Insufficient documentation

## 2010-11-24 DIAGNOSIS — K589 Irritable bowel syndrome without diarrhea: Secondary | ICD-10-CM | POA: Insufficient documentation

## 2010-11-24 DIAGNOSIS — E119 Type 2 diabetes mellitus without complications: Secondary | ICD-10-CM | POA: Insufficient documentation

## 2010-11-24 LAB — BASIC METABOLIC PANEL
BUN: 13 mg/dL (ref 6–23)
CO2: 30 mEq/L (ref 19–32)
Calcium: 10.9 mg/dL — ABNORMAL HIGH (ref 8.4–10.5)
Chloride: 98 mEq/L (ref 96–112)
Creatinine, Ser: 0.79 mg/dL (ref 0.50–1.10)
GFR calc Af Amer: >60 mL/min (ref >60)
GFR calc non Af Amer: >60 mL/min (ref >60)
Glucose, Bld: 316 mg/dL — ABNORMAL HIGH (ref 70–99)
Potassium: 3.1 mEq/L — ABNORMAL LOW (ref 3.5–5.1)
Sodium: 137 mEq/L (ref 135–145)

## 2010-11-24 LAB — CBC
HCT: 34.4 % — ABNORMAL LOW (ref 36.0–46.0)
Hemoglobin: 11.7 g/dL — ABNORMAL LOW (ref 12.0–15.0)
MCH: 26.8 pg (ref 26.0–34.0)
MCHC: 34 g/dL (ref 30.0–36.0)
MCV: 78.9 fL (ref 78.0–100.0)
Platelets: 326 10*3/uL (ref 150–400)
RBC: 4.36 MIL/uL (ref 3.87–5.11)
RDW: 13.2 % (ref 11.5–15.5)
WBC: 6.3 10*3/uL (ref 4.0–10.5)

## 2010-11-24 LAB — URINALYSIS, ROUTINE W REFLEX MICROSCOPIC
Bilirubin Urine: NEGATIVE
Glucose, UA: >1000 mg/dL — AB
Leukocytes, UA: NEGATIVE
Nitrite: NEGATIVE
Protein, ur: NEGATIVE mg/dL
Specific Gravity, Urine: >1.03 — ABNORMAL HIGH (ref 1.005–1.030)
Urobilinogen, UA: 0.2 mg/dL (ref 0.0–1.0)
pH: 5.5 (ref 5.0–8.0)

## 2010-11-24 LAB — URINE MICROSCOPIC-ADD ON

## 2010-11-24 LAB — DIFFERENTIAL
Basophils Absolute: 0 10*3/uL (ref 0.0–0.1)
Basophils Relative: 1 % (ref 0–1)
Eosinophils Absolute: 0.2 10*3/uL (ref 0.0–0.7)
Eosinophils Relative: 3 % (ref 0–5)
Lymphocytes Relative: 46 % (ref 12–46)
Lymphs Abs: 2.9 10*3/uL (ref 0.7–4.0)
Monocytes Absolute: 0.4 10*3/uL (ref 0.1–1.0)
Monocytes Relative: 6 % (ref 3–12)
Neutro Abs: 2.8 10*3/uL (ref 1.7–7.7)
Neutrophils Relative %: 45 % (ref 43–77)

## 2010-11-24 LAB — GLUCOSE, CAPILLARY
Glucose-Capillary: 339 mg/dL — ABNORMAL HIGH (ref 70–99)
Glucose-Capillary: 172 mg/dL — ABNORMAL HIGH (ref 70–99)

## 2010-11-24 LAB — POCT PREGNANCY, URINE: Preg Test, Ur: NEGATIVE

## 2010-11-27 LAB — URINE CULTURE
Colony Count: 30000
Culture  Setup Time: 201207022020

## 2010-12-14 ENCOUNTER — Emergency Department (HOSPITAL_COMMUNITY)
Admission: EM | Admit: 2010-12-14 | Discharge: 2010-12-14 | Disposition: A | Discharge status: Home / Self Care | Payer: Medicare Other | Attending: Emergency Medicine | Admitting: Emergency Medicine

## 2010-12-14 ENCOUNTER — Emergency Department (HOSPITAL_COMMUNITY): Payer: Medicare Other

## 2010-12-14 ENCOUNTER — Encounter: Payer: Self-pay | Admitting: *Deleted

## 2010-12-14 DIAGNOSIS — R1084 Generalized abdominal pain: Secondary | ICD-10-CM

## 2010-12-14 DIAGNOSIS — Z79899 Other long term (current) drug therapy: Secondary | ICD-10-CM | POA: Insufficient documentation

## 2010-12-14 DIAGNOSIS — F319 Bipolar disorder, unspecified: Secondary | ICD-10-CM | POA: Insufficient documentation

## 2010-12-14 DIAGNOSIS — F411 Generalized anxiety disorder: Secondary | ICD-10-CM | POA: Insufficient documentation

## 2010-12-14 DIAGNOSIS — R3915 Urgency of urination: Secondary | ICD-10-CM | POA: Insufficient documentation

## 2010-12-14 DIAGNOSIS — M549 Dorsalgia, unspecified: Secondary | ICD-10-CM | POA: Insufficient documentation

## 2010-12-14 DIAGNOSIS — E876 Hypokalemia: Secondary | ICD-10-CM | POA: Insufficient documentation

## 2010-12-14 HISTORY — DX: Bipolar disorder, unspecified: F31.9

## 2010-12-14 HISTORY — DX: Anxiety disorder, unspecified: F41.9

## 2010-12-14 HISTORY — DX: Major depressive disorder, single episode, unspecified: F32.9

## 2010-12-14 HISTORY — DX: Depression, unspecified: F32.A

## 2010-12-14 LAB — URINALYSIS, ROUTINE W REFLEX MICROSCOPIC
Glucose, UA: NEGATIVE mg/dL
Hgb urine dipstick: NEGATIVE
Ketones, ur: NEGATIVE mg/dL
Leukocytes, UA: NEGATIVE
Nitrite: NEGATIVE
Protein, ur: NEGATIVE mg/dL
Specific Gravity, Urine: >1.03 — ABNORMAL HIGH (ref 1.005–1.030)
Urobilinogen, UA: 1 mg/dL (ref 0.0–1.0)
pH: 6 (ref 5.0–8.0)

## 2010-12-14 LAB — CBC
HCT: 34.5 % — ABNORMAL LOW (ref 36.0–46.0)
Hemoglobin: 11.4 g/dL — ABNORMAL LOW (ref 12.0–15.0)
MCH: 26.5 pg (ref 26.0–34.0)
MCHC: 33 g/dL (ref 30.0–36.0)
MCV: 80.2 fL (ref 78.0–100.0)
Platelets: 290 10*3/uL (ref 150–400)
RBC: 4.3 MIL/uL (ref 3.87–5.11)
RDW: 14.1 % (ref 11.5–15.5)
WBC: 8.1 10*3/uL (ref 4.0–10.5)

## 2010-12-14 LAB — BASIC METABOLIC PANEL
BUN: 9 mg/dL (ref 6–23)
CO2: 30 mEq/L (ref 19–32)
Calcium: 9.4 mg/dL (ref 8.4–10.5)
Chloride: 98 mEq/L (ref 96–112)
Creatinine, Ser: 0.83 mg/dL (ref 0.50–1.10)
GFR calc Af Amer: >60 mL/min (ref >60)
GFR calc non Af Amer: >60 mL/min (ref >60)
Glucose, Bld: 127 mg/dL — ABNORMAL HIGH (ref 70–99)
Potassium: 3.2 mEq/L — ABNORMAL LOW (ref 3.5–5.1)
Sodium: 138 mEq/L (ref 135–145)

## 2010-12-14 LAB — POCT PREGNANCY, URINE: Preg Test, Ur: NEGATIVE

## 2010-12-14 MED ORDER — SODIUM CHLORIDE 0.9 % IV SOLN: Freq: Once | INTRAVENOUS | Status: DC

## 2010-12-14 MED ORDER — ONDANSETRON HCL 4 MG PO TABS: 4.0000 mg | ORAL_TABLET | Freq: Four times a day (QID) | ORAL | Status: AC

## 2010-12-14 MED ORDER — POTASSIUM CHLORIDE CRYS ER 20 MEQ PO TBCR
40.0000 meq | EXTENDED_RELEASE_TABLET | Freq: Once | ORAL | Status: AC
  Administered 2010-12-14: 40 meq via ORAL
  Filled 2010-12-14: qty 2

## 2010-12-14 MED ORDER — HYDROCODONE-ACETAMINOPHEN 5-325 MG PO TABS: ORAL_TABLET | ORAL | Status: DC

## 2010-12-14 MED ORDER — POTASSIUM CHLORIDE 20 MEQ PO PACK: 40.0000 meq | PACK | Freq: Once | ORAL | Status: DC

## 2010-12-14 NOTE — ED Provider Notes (Addendum)
History     Chief Complaint  Patient presents with  . Abdominal Pain   Patient is a 40 y.o. female presenting with abdominal pain. The history is provided by the patient.  Abdominal Pain The primary symptoms of the illness include abdominal pain. The primary symptoms of the illness do not include fever, shortness of breath, nausea, vomiting, dysuria, vaginal discharge or vaginal bleeding. Episode onset: 2 weeks. The onset of the illness was gradual. The problem has been gradually worsening.  The illness is associated with eating. The patient has not had a change in bowel habit. Additional symptoms associated with the illness include diaphoresis, urgency and back pain. Symptoms associated with the illness do not include chills, anorexia, heartburn, constipation, hematuria or frequency. Significant associated medical issues include inflammatory bowel disease and diabetes. Significant associated medical issues do not include PUD, GERD, sickle cell disease, gallstones, liver disease, diverticulitis, HIV or cardiac disease.    Past Medical History  Diagnosis Date  . Diabetes mellitus   . Bipolar disorder   . Depression   . Anxiety     Past Surgical History  Procedure Date  . Cesarean section   . Tubal ligation   . Breast surgery     No family history on file.  History  Substance Use Topics  . Smoking status: Never Smoker   . Smokeless tobacco: Not on file  . Alcohol Use: No    OB History    Grav Para Term Preterm Abortions TAB SAB Ect Mult Living                  Review of Systems  Constitutional: Positive for diaphoresis. Negative for fever, chills and activity change.       All ROS Neg except as noted in HPI  HENT: Negative for nosebleeds and neck pain.   Eyes: Negative for photophobia and discharge.  Respiratory: Negative for cough, shortness of breath and wheezing.   Cardiovascular: Negative for chest pain and palpitations.  Gastrointestinal: Positive for abdominal  pain. Negative for heartburn, nausea, vomiting, constipation, blood in stool and anorexia.  Genitourinary: Positive for urgency. Negative for dysuria, frequency, hematuria, vaginal bleeding and vaginal discharge.  Musculoskeletal: Positive for back pain. Negative for arthralgias.  Skin: Negative.   Neurological: Negative for dizziness, seizures and speech difficulty.  Psychiatric/Behavioral: Negative for hallucinations and confusion.    Physical Exam  BP 114/70  Pulse 103  Temp(Src) 99.2 F (37.3 C) (Oral)  Resp 16  Ht 5' (1.524 m)  Wt 154 lb (69.854 kg)  BMI 30.08 kg/m2  SpO2 98%  LMP 10/31/2010  Physical Exam  Nursing note and vitals reviewed. Constitutional: She is oriented to person, place, and time. She appears well-developed and well-nourished.  Non-toxic appearance.  HENT:  Head: Normocephalic.  Right Ear: Tympanic membrane and external ear normal.  Left Ear: Tympanic membrane and external ear normal.  Eyes: EOM and lids are normal. Pupils are equal, round, and reactive to light.  Neck: Normal range of motion. Neck supple. Carotid bruit is not present.  Cardiovascular: Normal rate, regular rhythm, normal heart sounds, intact distal pulses and normal pulses.   Pulmonary/Chest: Breath sounds normal. No respiratory distress.  Abdominal: Soft. Bowel sounds are normal. There is tenderness. There is guarding.  Musculoskeletal: Normal range of motion.  Lymphadenopathy:       Head (right side): No submandibular adenopathy present.       Head (left side): No submandibular adenopathy present.    She has no cervical  adenopathy.  Neurological: She is alert and oriented to person, place, and time. She has normal strength. No cranial nerve deficit or sensory deficit.  Skin: Skin is warm and dry.  Psychiatric: She has a normal mood and affect. Her speech is normal.    ED Course  Procedures  MDM I have reviewed nursing notes, vital signs, and all appropriate lab and imaging  results for this patient.    Medical screening examination/treatment/procedure(s) were performed by non-physician practitioner and as supervising physician I was immediately available for consultation/collaboration.   No acute abdomen on exam  Kathie Dike, PA 12/14/10 [address] [place], [place] 12/14/10 1907  Donnetta Hutching, MD 01/02/11 1009  Donnetta Hutching, MD 01/20/11 [phone]

## 2010-12-14 NOTE — ED Notes (Signed)
Pt a/ox4. Resp even and unlabored. NAD at this time. D/C instructions reviewed with pt. Pt verbalized understanding. Pt escorted to d/c desk. Pt ambulated with steady gate. 

## 2010-12-14 NOTE — ED Notes (Signed)
Pt states lower abdominal pain, N/V/D x 2 weeks. Bilateral hip pain also. NAD. Pt states she last vomited at 0300. NAD at this time.

## 2010-12-14 NOTE — Progress Notes (Signed)
"  Test results and plan discussed with pt and family. Questions answered.

## 2010-12-22 ENCOUNTER — Encounter (HOSPITAL_COMMUNITY): Payer: Self-pay | Admitting: *Deleted

## 2010-12-22 ENCOUNTER — Emergency Department (HOSPITAL_COMMUNITY)
Admission: EM | Admit: 2010-12-22 | Discharge: 2010-12-23 | Discharge status: Against Medical Advice | Payer: Medicare Other | Attending: Emergency Medicine | Admitting: Emergency Medicine

## 2010-12-22 DIAGNOSIS — S0990XA Unspecified injury of head, initial encounter: Secondary | ICD-10-CM | POA: Insufficient documentation
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
## 2010-12-30 NOTE — ED Provider Notes (Signed)
History     CSN: [id number] Arrival date & time: 12/14/2010  3:35 PM  Chief Complaint  Patient presents with  . Abdominal Pain   Patient is a 40 y.o. female presenting with abdominal pain. The history is provided by the patient.  Abdominal Pain The primary symptoms of the illness include abdominal pain, nausea, vomiting and diarrhea. The primary symptoms of the illness do not include fever or dysuria. The current episode started more than 2 days ago. The onset of the illness was gradual. The problem has not changed since onset. Associated with: nothing. Symptoms associated with the illness do not include chills, diaphoresis or heartburn.    Past Medical History  Diagnosis Date  . Diabetes mellitus   . Bipolar disorder   . Depression   . Anxiety     Past Surgical History  Procedure Date  . Cesarean section   . Tubal ligation   . Breast surgery     No family history on file.  History  Substance Use Topics  . Smoking status: Never Smoker   . Smokeless tobacco: Not on file  . Alcohol Use: No    OB History    Grav Para Term Preterm Abortions TAB SAB Ect Mult Living                  Review of Systems  Constitutional: Negative for fever, chills and diaphoresis.  Gastrointestinal: Positive for nausea, vomiting, abdominal pain and diarrhea. Negative for heartburn.  Genitourinary: Negative for dysuria.  All other systems reviewed and are negative.    Rate:103  Rhythm: sinus tachycardia  QRS Axis: normal  Intervals: normal  ST/T Wave abnormalities: normal  Conduction Disutrbances:none  Narrative Interpretation:   Old EKG Reviewed: none availableMedical screening examination/treatment/procedure(s) were conducted as a shared visit with non-physician practitioner(s) and myself.  I personally evaluated the patient during the encounter  Physical Exam  BP 109/73  Pulse 93  Temp(Src) 99.2 F (37.3 C) (Oral)  Resp 18  Ht 5' (1.524 m)  Wt 154 lb (69.854 kg)  BMI 30.08  kg/m2  SpO2 100%  LMP 10/31/2010  Physical Exam  Nursing note and vitals reviewed. Constitutional: She is oriented to person, place, and time. She appears well-developed and well-nourished.  HENT:  Head: Normocephalic and atraumatic.  Eyes: Conjunctivae and EOM are normal. Pupils are equal, round, and reactive to light.  Neck: Normal range of motion. Neck supple.  Cardiovascular: Normal rate and regular rhythm.   Pulmonary/Chest: Effort normal and breath sounds normal.  Abdominal: Soft. Bowel sounds are normal.       Min diffuse tenderness;  No acute abd  Musculoskeletal: Normal range of motion.  Neurological: She is alert and oriented to person, place, and time.  Skin: Skin is warm and dry.  Psychiatric: She has a normal mood and affect.    ED Course  Procedures  MDM  Results for orders placed during the hospital encounter of 12/14/10  URINALYSIS, ROUTINE W REFLEX MICROSCOPIC      Component Value Range   Color, Urine YELLOW  YELLOW    Appearance CLOUDY (*) CLEAR    Specific Gravity, Urine >1.030 (*) 1.005 - 1.030    pH 6.0  5.0 - 8.0    Glucose, UA NEGATIVE  NEGATIVE (mg/dL)   Hgb urine dipstick NEGATIVE  NEGATIVE    Bilirubin Urine SMALL (*) NEGATIVE    Ketones, ur NEGATIVE  NEGATIVE (mg/dL)   Protein, ur NEGATIVE  NEGATIVE (mg/dL)   Urobilinogen, UA  1.0  0.0 - 1.0 (mg/dL)   Nitrite NEGATIVE  NEGATIVE    Leukocytes, UA NEGATIVE  NEGATIVE   CBC      Component Value Range   WBC 8.1  4.0 - 10.5 (K/uL)   RBC 4.30  3.87 - 5.11 (MIL/uL)   Hemoglobin 11.4 (*) 12.0 - 15.0 (g/dL)   HCT 16.1 (*) 09.6 - 46.0 (%)   MCV 80.2  78.0 - 100.0 (fL)   MCH 26.5  26.0 - 34.0 (pg)   MCHC 33.0  30.0 - 36.0 (g/dL)   RDW 04.5  40.9 - 81.1 (%)   Platelets 290  150 - 400 (K/uL)  BASIC METABOLIC PANEL      Component Value Range   Sodium 138  135 - 145 (mEq/L)   Potassium 3.2 (*) 3.5 - 5.1 (mEq/L)   Chloride 98  96 - 112 (mEq/L)   CO2 30  19 - 32 (mEq/L)   Glucose, Bld 127 (*) 70 - 99  (mg/dL)   BUN 9  6 - 23 (mg/dL)   Creatinine, Ser 9.14  0.50 - 1.10 (mg/dL)   Calcium 9.4  8.4 - 78.2 (mg/dL)   GFR calc non Af Amer >60  >60 (mL/min)   GFR calc Af Amer >60  >60 (mL/min)  POCT PREGNANCY, URINE      Component Value Range   Preg Test, Ur NEGATIVE     Dg Abd Acute W/chest  12/14/2010  *RADIOLOGY REPORT*  Clinical Data: Diffuse abdominal pain with nausea and vomiting  ACUTE ABDOMEN SERIES (ABDOMEN 2 VIEW & CHEST 1 VIEW)  Comparison:  07/17/2010.  Findings:  There is no evidence of dilated bowel loops or free intraperitoneal air.  No radiopaque calculi or other significant radiographic abnormality is seen. Stable presumed calcific lymph node to the left of the spine at L3-L4.  Heart size and mediastinal contours are within normal limits.  Both lungs are clear. Small surgical clip left abdomen.  Normal osseous structures.  IMPRESSION: Negative abdominal radiographs.  No acute cardiopulmonary disease.  Original Report Authenticated By: Elsie Stain, M.D.     Nausea, vomiting, and diarrhea for 2 weeks. No acute abdomen.  Donnetta Hutching, MD 01/03/11 [phone]

## 2011-02-18 LAB — POCT I-STAT, CHEM 8
BUN: 13
Calcium, Ion: 1.24
Chloride: 103
Creatinine, Ser: 1.1
Glucose, Bld: 96
HCT: 39
Hemoglobin: 13.3
Potassium: 3.2 — ABNORMAL LOW
Sodium: 140
TCO2: 26

## 2011-02-18 LAB — CBC
HCT: 38.1
Hemoglobin: 13.1
MCHC: 34.6
MCV: 85.7
Platelets: 279
RBC: 4.44
RDW: 12.7
WBC: 6.3

## 2011-02-18 LAB — GC/CHLAMYDIA PROBE AMP, GENITAL
Chlamydia, DNA Probe: NEGATIVE
GC Probe Amp, Genital: NEGATIVE

## 2011-02-18 LAB — POCT PREGNANCY, URINE
Operator id: 257131
Preg Test, Ur: NEGATIVE

## 2011-02-18 LAB — URINALYSIS, ROUTINE W REFLEX MICROSCOPIC
Bilirubin Urine: NEGATIVE
Glucose, UA: NEGATIVE
Hgb urine dipstick: NEGATIVE
Ketones, ur: NEGATIVE
Nitrite: NEGATIVE
Protein, ur: NEGATIVE
Specific Gravity, Urine: 1.021
Urobilinogen, UA: 0.2
pH: 5.5

## 2011-02-18 LAB — URINE CULTURE: Colony Count: 5000

## 2011-02-18 LAB — DIFFERENTIAL
Basophils Absolute: 0
Basophils Relative: 1
Eosinophils Absolute: 0.3
Eosinophils Relative: 4
Lymphocytes Relative: 39
Lymphs Abs: 2.4
Monocytes Absolute: 0.4
Monocytes Relative: 7
Neutro Abs: 3.1
Neutrophils Relative %: 49

## 2011-02-18 LAB — URINE MICROSCOPIC-ADD ON

## 2011-02-18 LAB — RPR: RPR Ser Ql: NONREACTIVE

## 2011-02-18 LAB — WET PREP, GENITAL
Trich, Wet Prep: NONE SEEN
Yeast Wet Prep HPF POC: NONE SEEN

## 2011-02-24 LAB — DIFFERENTIAL
Basophils Absolute: 0
Basophils Relative: 0
Eosinophils Absolute: 0.1
Eosinophils Relative: 1
Lymphocytes Relative: 32
Lymphs Abs: 2.7
Monocytes Absolute: 0.7
Monocytes Relative: 9
Neutro Abs: 4.8
Neutrophils Relative %: 57

## 2011-02-24 LAB — RAPID URINE DRUG SCREEN, HOSP PERFORMED
Amphetamines: NOT DETECTED
Barbiturates: NOT DETECTED
Benzodiazepines: NOT DETECTED
Cocaine: NOT DETECTED
Opiates: POSITIVE — AB
Tetrahydrocannabinol: NOT DETECTED

## 2011-02-24 LAB — BASIC METABOLIC PANEL
BUN: 9
CO2: 28
Calcium: 9.5
Chloride: 101
Creatinine, Ser: 0.86
GFR calc Af Amer: >60
GFR calc non Af Amer: >60
Glucose, Bld: 116 — ABNORMAL HIGH
Potassium: 3.4 — ABNORMAL LOW
Sodium: 134 — ABNORMAL LOW

## 2011-02-24 LAB — CBC
HCT: 41.9
Hemoglobin: 14.3
MCHC: 34.1
MCV: 84.2
Platelets: 420 — ABNORMAL HIGH
RBC: 4.98
RDW: 13.3
WBC: 8.5

## 2011-02-24 LAB — ETHANOL: Alcohol, Ethyl (B): <5

## 2011-02-27 LAB — PREGNANCY, URINE: Preg Test, Ur: NEGATIVE

## 2011-02-27 LAB — URINALYSIS, ROUTINE W REFLEX MICROSCOPIC
Bilirubin Urine: NEGATIVE
Glucose, UA: NEGATIVE mg/dL
Hgb urine dipstick: NEGATIVE
Ketones, ur: NEGATIVE mg/dL
Nitrite: NEGATIVE
Protein, ur: NEGATIVE mg/dL
Specific Gravity, Urine: 1.02 (ref 1.005–1.030)
Urobilinogen, UA: 0.2 mg/dL (ref 0.0–1.0)
pH: 8 (ref 5.0–8.0)

## 2011-03-18 ENCOUNTER — Encounter (HOSPITAL_COMMUNITY): Payer: Self-pay | Admitting: *Deleted

## 2011-03-18 ENCOUNTER — Emergency Department (HOSPITAL_COMMUNITY)
Admission: EM | Admit: 2011-03-18 | Discharge: 2011-03-19 | Disposition: A | Discharge status: Home / Self Care | Payer: Medicare Other | Attending: Emergency Medicine | Admitting: Emergency Medicine

## 2011-03-18 DIAGNOSIS — R42 Dizziness and giddiness: Secondary | ICD-10-CM | POA: Insufficient documentation

## 2011-03-18 DIAGNOSIS — R000 Tachycardia, unspecified: Secondary | ICD-10-CM | POA: Insufficient documentation

## 2011-03-18 DIAGNOSIS — F319 Bipolar disorder, unspecified: Secondary | ICD-10-CM | POA: Insufficient documentation

## 2011-03-18 DIAGNOSIS — F411 Generalized anxiety disorder: Secondary | ICD-10-CM | POA: Insufficient documentation

## 2011-03-18 DIAGNOSIS — E119 Type 2 diabetes mellitus without complications: Secondary | ICD-10-CM | POA: Insufficient documentation

## 2011-03-18 DIAGNOSIS — N390 Urinary tract infection, site not specified: Secondary | ICD-10-CM | POA: Insufficient documentation

## 2011-03-18 DIAGNOSIS — R739 Hyperglycemia, unspecified: Secondary | ICD-10-CM

## 2011-03-18 MED ORDER — SODIUM CHLORIDE 0.9 % IV BOLUS (SEPSIS)
1000.0000 mL | Freq: Once | INTRAVENOUS | Status: AC
  Administered 2011-03-18: 1000 mL via INTRAVENOUS

## 2011-03-18 NOTE — ED Notes (Signed)
Pt reports cbg reading tonight over 500. Pt complained of seeing spots, being very weak. Pt pcp is adjusting meds & is to be seen by pcp on Friday. Pt also complaining of a ha at this time.

## 2011-03-18 NOTE — ED Notes (Signed)
Pt reports she took cbg at home tonight and had a reading over 500, cbg in triage 258

## 2011-03-19 LAB — CBC
HCT: 31.9 % — ABNORMAL LOW (ref 36.0–46.0)
Hemoglobin: 10.4 g/dL — ABNORMAL LOW (ref 12.0–15.0)
MCH: 27.2 pg (ref 26.0–34.0)
MCHC: 32.6 g/dL (ref 30.0–36.0)
MCV: 83.3 fL (ref 78.0–100.0)
Platelets: 304 10*3/uL (ref 150–400)
RBC: 3.83 MIL/uL — ABNORMAL LOW (ref 3.87–5.11)
RDW: 14.5 % (ref 11.5–15.5)
WBC: 6 10*3/uL (ref 4.0–10.5)

## 2011-03-19 LAB — COMPREHENSIVE METABOLIC PANEL
ALT: 15 U/L (ref 0–35)
AST: 15 U/L (ref 0–37)
Albumin: 3.5 g/dL (ref 3.5–5.2)
Alkaline Phosphatase: 74 U/L (ref 39–117)
BUN: 12 mg/dL (ref 6–23)
CO2: 28 mEq/L (ref 19–32)
Calcium: 8.6 mg/dL (ref 8.4–10.5)
Chloride: 102 mEq/L (ref 96–112)
Creatinine, Ser: 0.86 mg/dL (ref 0.50–1.10)
GFR calc Af Amer: >90 mL/min (ref >90)
GFR calc non Af Amer: 83 mL/min — ABNORMAL LOW (ref >90)
Glucose, Bld: 225 mg/dL — ABNORMAL HIGH (ref 70–99)
Potassium: 3.3 mEq/L — ABNORMAL LOW (ref 3.5–5.1)
Sodium: 139 mEq/L (ref 135–145)
Total Bilirubin: 0.2 mg/dL — ABNORMAL LOW (ref 0.3–1.2)
Total Protein: 6.7 g/dL (ref 6.0–8.3)

## 2011-03-19 LAB — URINALYSIS, ROUTINE W REFLEX MICROSCOPIC
Glucose, UA: NEGATIVE mg/dL
Nitrite: NEGATIVE
Protein, ur: 100 mg/dL — AB
Specific Gravity, Urine: >1.03 — ABNORMAL HIGH (ref 1.005–1.030)
Urobilinogen, UA: 0.2 mg/dL (ref 0.0–1.0)
pH: 5 (ref 5.0–8.0)

## 2011-03-19 LAB — DIFFERENTIAL
Basophils Absolute: 0 10*3/uL (ref 0.0–0.1)
Basophils Relative: 1 % (ref 0–1)
Eosinophils Absolute: 0.2 10*3/uL (ref 0.0–0.7)
Eosinophils Relative: 4 % (ref 0–5)
Lymphocytes Relative: 40 % (ref 12–46)
Lymphs Abs: 2.4 10*3/uL (ref 0.7–4.0)
Monocytes Absolute: 0.3 10*3/uL (ref 0.1–1.0)
Monocytes Relative: 6 % (ref 3–12)
Neutro Abs: 3 10*3/uL (ref 1.7–7.7)
Neutrophils Relative %: 50 % (ref 43–77)

## 2011-03-19 LAB — LITHIUM LEVEL: Lithium Lvl: 0.68 mEq/L — ABNORMAL LOW (ref 0.80–1.40)

## 2011-03-19 LAB — URINE MICROSCOPIC-ADD ON

## 2011-03-19 LAB — GLUCOSE, CAPILLARY
Glucose-Capillary: 181 mg/dL — ABNORMAL HIGH (ref 70–99)
Glucose-Capillary: 245 mg/dL — ABNORMAL HIGH (ref 70–99)
Glucose-Capillary: 258 mg/dL — ABNORMAL HIGH (ref 70–99)

## 2011-03-19 MED ORDER — SODIUM CHLORIDE 0.9 % IV BOLUS (SEPSIS)
1000.0000 mL | Freq: Once | INTRAVENOUS | Status: AC
  Administered 2011-03-19: 1000 mL via INTRAVENOUS

## 2011-03-19 MED ORDER — CIPROFLOXACIN HCL 500 MG PO TABS: 500.0000 mg | ORAL_TABLET | Freq: Two times a day (BID) | ORAL | Status: AC

## 2011-03-19 NOTE — ED Notes (Signed)
Pt given discharge instructions & prescription(s), pt verbalized understanding.

## 2011-03-19 NOTE — ED Notes (Signed)
Pt resting w/ eyes closed. Woke pt to see if she could get a urine sample.

## 2011-03-19 NOTE — ED Provider Notes (Signed)
History     CSN: [id number] Arrival date & time: 03/18/2011 11:15 PM   First MD Initiated Contact with Patient 03/18/11 2334      Chief Complaint  Patient presents with  . Hyperglycemia    (Consider location/radiation/quality/duration/timing/severity/associated sxs/prior treatment) HPI Comments: Patient is a pleasant 40 year old female with a history of diabetes, high blood pressure, high cholesterol who presents with a complaint of high blood sugar. She states this happened over the day with a sugar that ranged from 300 to over 500. Since being diagnosed with diabetes in January of this year she has had blood sugars no lower than 300. This is despite taking metformin 1000 mg a day and now taking insulin twice a day. She is supposed to followup with her family doctor in the ear future for reevaluation of this. She admits to frequent urination, polydipsia, lightheadedness and endorses having a dizzy spell culminating in syncope today. She denies fevers, chills, nausea or vomiting. Symptoms are gradually getting worse and her severe today.  The history is provided by the patient and a relative.    Past Medical History  Diagnosis Date  . Diabetes mellitus   . Bipolar disorder   . Depression   . Anxiety     Past Surgical History  Procedure Date  . Cesarean section   . Tubal ligation   . Breast surgery     No family history on file.  History  Substance Use Topics  . Smoking status: Never Smoker   . Smokeless tobacco: Not on file  . Alcohol Use: No    OB History    Grav Para Term Preterm Abortions TAB SAB Ect Mult Living                  Review of Systems  All other systems reviewed and are negative.    Allergies  Review of patient's allergies indicates no known allergies.  Home Medications   Current Outpatient Rx  Name Route Sig Dispense Refill  . ASPIRIN 81 MG PO TABS Oral Take 81 mg by mouth at bedtime.      Marland Kitchen VITAMIN D 400 UNITS PO CAPS Oral Take 400 Units  by mouth daily.      . DULOXETINE HCL 60 MG PO CPEP Oral Take 60 mg by mouth 2 (two) times daily.      Marland Kitchen EXENATIDE 10 MCG/0.04ML [place] SOLN Subcutaneous Inject 30 mcg into the skin 2 (two) times daily with a meal.     . HYDROCODONE-ACETAMINOPHEN 5-325 MG PO TABS  1-2 po q4h prn pain 12 tablet 0  . LISINOPRIL 2.5 MG PO TABS Oral Take 2.5 mg by mouth daily.      Marland Kitchen LITHIUM CARBONATE 300 MG PO CAPS Oral Take 600 mg by mouth at bedtime.      Marland Kitchen METFORMIN HCL 1000 MG PO TABS Oral Take 1,000 mg by mouth 2 (two) times daily with a meal.      . NAPROXEN 500 MG PO TABS Oral Take 500 mg by mouth 2 (two) times daily as needed. FOR PAIN     . OLANZAPINE 20 MG PO TABS Oral Take 20 mg by mouth daily.      Marland Kitchen POTASSIUM BICARBONATE PO Oral Take 1 tablet by mouth daily.      . QUETIAPINE FUMARATE 400 MG PO TB24 Oral Take 800 mg by mouth every evening. AT 6PM     . SIMVASTATIN 20 MG PO TABS Oral Take 20 mg by mouth daily.      Marland Kitchen  TRAZODONE HCL 100 MG PO TABS Oral Take 200 mg by mouth at bedtime.        BP 122/73  Pulse 112  Temp(Src) 97.7 F (36.5 C) (Oral)  Resp 12  Ht 5' (1.524 m)  Wt 174 lb (78.926 kg)  BMI 33.98 kg/m2  SpO2 98%  LMP 03/18/2011  Physical Exam  Nursing note and vitals reviewed. Constitutional: She appears well-developed and well-nourished. No distress.  HENT:  Head: Normocephalic and atraumatic.  Mouth/Throat: No oropharyngeal exudate.       Mucous membranes dehydrated  Eyes: Conjunctivae and EOM are normal. Pupils are equal, round, and reactive to light. Right eye exhibits no discharge. Left eye exhibits no discharge. No scleral icterus.  Neck: Normal range of motion. Neck supple. No JVD present. No thyromegaly present.  Cardiovascular: Regular rhythm, normal heart sounds and intact distal pulses.  Exam reveals no gallop and no friction rub.   No murmur heard.      Tachycardia  Pulmonary/Chest: Effort normal and breath sounds normal. No respiratory distress. She has no wheezes. She  has no rales.  Abdominal: Soft. Bowel sounds are normal. She exhibits no distension and no mass. There is no tenderness.  Musculoskeletal: Normal range of motion. She exhibits no edema and no tenderness.  Lymphadenopathy:    She has no cervical adenopathy.  Neurological: She is alert. Coordination normal.  Skin: Skin is warm and dry. No rash noted. No erythema.  Psychiatric: She has a normal mood and affect. Her behavior is normal.    ED Course  Procedures (including critical care time)   Labs Reviewed  CBC  DIFFERENTIAL  COMPREHENSIVE METABOLIC PANEL  URINALYSIS, ROUTINE W REFLEX MICROSCOPIC  POCT CBG MONITORING  LITHIUM LEVEL   No results found.   No diagnosis found.    MDM  Patient appears hydrated with dry mucous membranes, tachycardic and had a dizzy spell while standing today. This all points towards likely severe dehydration related to ongoing hyperglycemia. We'll need to rule out diabetic ketoacidosis, aggressive fluid rehydration.      Results for orders placed during the hospital encounter of 03/18/11  CBC      Component Value Range   WBC 6.0  4.0 - 10.5 (K/uL)   RBC 3.83 (*) 3.87 - 5.11 (MIL/uL)   Hemoglobin 10.4 (*) 12.0 - 15.0 (g/dL)   HCT 96.0 (*) 45.4 - 46.0 (%)   MCV 83.3  78.0 - 100.0 (fL)   MCH 27.2  26.0 - 34.0 (pg)   MCHC 32.6  30.0 - 36.0 (g/dL)   RDW 09.8  11.9 - 14.7 (%)   Platelets 304  150 - 400 (K/uL)  DIFFERENTIAL      Component Value Range   Neutrophils Relative 50  43 - 77 (%)   Neutro Abs 3.0  1.7 - 7.7 (K/uL)   Lymphocytes Relative 40  12 - 46 (%)   Lymphs Abs 2.4  0.7 - 4.0 (K/uL)   Monocytes Relative 6  3 - 12 (%)   Monocytes Absolute 0.3  0.1 - 1.0 (K/uL)   Eosinophils Relative 4  0 - 5 (%)   Eosinophils Absolute 0.2  0.0 - 0.7 (K/uL)   Basophils Relative 1  0 - 1 (%)   Basophils Absolute 0.0  0.0 - 0.1 (K/uL)  COMPREHENSIVE METABOLIC PANEL      Component Value Range   Sodium 139  135 - 145 (mEq/L)   Potassium 3.3 (*)  3.5 - 5.1 (mEq/L)   Chloride 102  96 - 112 (mEq/L)   CO2 28  19 - 32 (mEq/L)   Glucose, Bld 225 (*) 70 - 99 (mg/dL)   BUN 12  6 - 23 (mg/dL)   Creatinine, Ser 1.61  0.50 - 1.10 (mg/dL)   Calcium 8.6  8.4 - 09.6 (mg/dL)   Total Protein 6.7  6.0 - 8.3 (g/dL)   Albumin 3.5  3.5 - 5.2 (g/dL)   AST 15  0 - 37 (U/L)   ALT 15  0 - 35 (U/L)   Alkaline Phosphatase 74  39 - 117 (U/L)   Total Bilirubin 0.2 (*) 0.3 - 1.2 (mg/dL)   GFR calc non Af Amer 83 (*) >90 (mL/min)   GFR calc Af Amer >90  >90 (mL/min)  URINALYSIS, ROUTINE W REFLEX MICROSCOPIC      Component Value Range   Color, Urine BROWN (*) YELLOW    Appearance CLOUDY (*) CLEAR    Specific Gravity, Urine >1.030 (*) 1.005 - 1.030    pH 5.0  5.0 - 8.0    Glucose, UA NEGATIVE  NEGATIVE (mg/dL)   Hgb urine dipstick LARGE (*) NEGATIVE    Bilirubin Urine SMALL (*) NEGATIVE    Ketones, ur TRACE (*) NEGATIVE (mg/dL)   Protein, ur 045 (*) NEGATIVE (mg/dL)   Urobilinogen, UA 0.2  0.0 - 1.0 (mg/dL)   Nitrite NEGATIVE  NEGATIVE    Leukocytes, UA SMALL (*) NEGATIVE   LITHIUM LEVEL      Component Value Range   Lithium Lvl 0.68 (*) 0.80 - 1.40 (mEq/L)  GLUCOSE, CAPILLARY      Component Value Range   Glucose-Capillary 245 (*) 70 - 99 (mg/dL)  GLUCOSE, CAPILLARY      Component Value Range   Glucose-Capillary 258 (*) 70 - 99 (mg/dL)  GLUCOSE, CAPILLARY      Component Value Range   Glucose-Capillary 181 (*) 70 - 99 (mg/dL)  URINE MICROSCOPIC-ADD ON      Component Value Range   Squamous Epithelial / LPF MANY (*) RARE    WBC, UA 7-10  <3 (WBC/hpf)   RBC / HPF TOO NUMEROUS TO COUNT  <3 (RBC/hpf)   Bacteria, UA MANY (*) RARE    No results found.  UA with borderline UTI - abx for same - fluids given, normalization in VS, will f/u closely.  Vida Roller, MD 03/19/11 [phone]

## 2011-03-19 NOTE — ED Notes (Signed)
Called lab for result of urine.

## 2011-06-16 ENCOUNTER — Other Ambulatory Visit (HOSPITAL_COMMUNITY): Payer: Self-pay | Admitting: Internal Medicine

## 2011-06-17 ENCOUNTER — Ambulatory Visit (HOSPITAL_COMMUNITY)
Admission: RE | Admit: 2011-06-17 | Discharge: 2011-06-17 | Disposition: A | Discharge status: Home / Self Care | Payer: Medicare Other | Source: Ambulatory Visit | Attending: Internal Medicine | Admitting: Internal Medicine

## 2011-06-20 ENCOUNTER — Ambulatory Visit (HOSPITAL_COMMUNITY)
Admission: RE | Admit: 2011-06-20 | Discharge: 2011-06-20 | Disposition: A | Discharge status: Home / Self Care | Payer: Medicare Other | Source: Ambulatory Visit | Attending: Internal Medicine | Admitting: Internal Medicine

## 2011-06-20 DIAGNOSIS — R109 Unspecified abdominal pain: Secondary | ICD-10-CM | POA: Insufficient documentation

## 2011-08-08 ENCOUNTER — Emergency Department (HOSPITAL_COMMUNITY)
Admission: EM | Admit: 2011-08-08 | Discharge: 2011-08-08 | Disposition: A | Discharge status: Home / Self Care | Payer: Medicare Other | Attending: Emergency Medicine | Admitting: Emergency Medicine

## 2011-08-08 ENCOUNTER — Encounter (HOSPITAL_COMMUNITY): Payer: Self-pay

## 2011-08-08 DIAGNOSIS — Z0389 Encounter for observation for other suspected diseases and conditions ruled out: Secondary | ICD-10-CM | POA: Insufficient documentation

## 2011-08-08 NOTE — ED Notes (Signed)
Pt states she was walking to a friends house and started having back pain and bilateral hip pain.

## 2011-08-08 NOTE — ED Notes (Signed)
Pt acuity changed to level 3 due to HR.

## 2011-08-08 NOTE — ED Notes (Signed)
Unable to locate pt in all waiting areas x 2 

## 2011-08-13 ENCOUNTER — Ambulatory Visit (HOSPITAL_COMMUNITY)
Admission: RE | Admit: 2011-08-13 | Discharge: 2011-08-13 | Disposition: A | Discharge status: Home / Self Care | Payer: Medicare Other | Source: Ambulatory Visit | Attending: Internal Medicine | Admitting: Internal Medicine

## 2011-08-13 ENCOUNTER — Other Ambulatory Visit (HOSPITAL_COMMUNITY): Payer: Self-pay | Admitting: Internal Medicine

## 2011-08-13 DIAGNOSIS — M25559 Pain in unspecified hip: Secondary | ICD-10-CM | POA: Insufficient documentation

## 2011-08-13 DIAGNOSIS — M25551 Pain in right hip: Secondary | ICD-10-CM

## 2011-08-13 DIAGNOSIS — M25552 Pain in left hip: Secondary | ICD-10-CM

## 2011-08-13 LAB — PREGNANCY, URINE: Preg Test, Ur: NEGATIVE

## 2011-09-15 ENCOUNTER — Other Ambulatory Visit (HOSPITAL_COMMUNITY): Payer: Self-pay | Admitting: Internal Medicine

## 2011-09-15 DIAGNOSIS — Z139 Encounter for screening, unspecified: Secondary | ICD-10-CM

## 2011-09-22 ENCOUNTER — Ambulatory Visit (HOSPITAL_COMMUNITY): Payer: Medicare Other

## 2011-09-23 ENCOUNTER — Ambulatory Visit (HOSPITAL_COMMUNITY): Payer: Medicare Other

## 2012-05-11 ENCOUNTER — Other Ambulatory Visit (HOSPITAL_COMMUNITY): Payer: Self-pay | Admitting: Internal Medicine

## 2012-05-11 DIAGNOSIS — R1900 Intra-abdominal and pelvic swelling, mass and lump, unspecified site: Secondary | ICD-10-CM

## 2012-05-12 ENCOUNTER — Ambulatory Visit (HOSPITAL_COMMUNITY)
Admission: RE | Admit: 2012-05-12 | Discharge: 2012-05-12 | Disposition: A | Discharge status: Home / Self Care | Payer: Medicare Other | Source: Ambulatory Visit | Attending: Internal Medicine | Admitting: Internal Medicine

## 2012-05-12 DIAGNOSIS — R1900 Intra-abdominal and pelvic swelling, mass and lump, unspecified site: Secondary | ICD-10-CM | POA: Insufficient documentation

## 2012-05-12 DIAGNOSIS — K824 Cholesterolosis of gallbladder: Secondary | ICD-10-CM | POA: Insufficient documentation

## 2012-05-12 DIAGNOSIS — K7689 Other specified diseases of liver: Secondary | ICD-10-CM | POA: Insufficient documentation

## 2012-05-12 DIAGNOSIS — R160 Hepatomegaly, not elsewhere classified: Secondary | ICD-10-CM | POA: Insufficient documentation

## 2012-05-27 NOTE — H&P (Signed)
  NTS SOAP Note  Vital Signs:  Vitals as of: 05/27/2012: Systolic 144: Diastolic 93: Heart Rate 104: Temp 97.61F: Height 26ft 0in: Weight 172Lbs 0 Ounces: Pain Level 9: BMI 34  BMI : 33.59 kg/m2  Subjective: This 42 Years 37 Months old Female presents for of    ABDOMINAL ISSUES: ,Has been having abdominal pain in right upper quadrant, nausea, and bloating for some time now.  Made worse with food.  U/S of gallbladder reveals cholelithiasis vs polyp.  No fever, chills, jaundice.  Review of Symptoms:  Constitutional:unremarkable   Head:unremarkable    Eyes:unremarkable   Nose/Mouth/Throat:unremarkable Cardiovascular:  unremarkable   Respiratory:unremarkable   Gastrointestin    abdominal pain,nausea Genitourinary:unremarkable     Musculoskeletal:unremarkable   Skin:unremarkable Hematolgic/Lymphatic:unremarkable     Allergic/Immunologic:unremarkable     Past Medical History:    Reviewed   Past Medical History  Surgical History: none Medical Problems: bipolar Allergies: nkda Medications: olanzapine, cymbalta, seroquel, trazadone, gabapentin, lithium   Social History:Reviewed  Social History  Preferred Language: English Ethnicity: Not Hispanic / Latino Age: 42 Years 4 Months   Smoking Status: Never smoker reviewed on 05/27/2012 Functional Status reviewed on mm/dd/yyyy ------------------------------------------------ Bathing: Normal Cooking: Normal Dressing: Normal Driving: Normal Eating: Normal Managing Meds: Normal Oral Care: Normal Shopping: Normal Toileting: Normal Transferring: Normal Walking: Normal Cognitive Status reviewed on mm/dd/yyyy ------------------------------------------------ Attention: Normal Decision Making: Normal Language: Normal Memory: Normal Motor: Normal Perception: Normal Problem Solving: Normal Visual and Spatial: Normal   Family History:  Reviewed   Family History  Is there a family  history [id number]    Objective Information: General:  Well appearing, well nourished in no distress.   no scleral icterus Heart:  RRR, no murmur Lungs:    CTA bilaterally, no wheezes, rhonchi, rales.  Breathing unlabored. Abdomen:Soft, NT/ND, no HSM, no masses.  Assessment:Biliary colic, cholelithiasis  Diagnosis &amp; Procedure:    Plan:Scheduled for laparoscopic cholecystectomy on 05/31/12.   Patient Education:Alternative treatments to surgery were discussed with patient (and family).  Risks and benefits  of procedure including bleeding, infection, hepatobiliary injury, and the possibility of an open procedure were fully explained to the patient (and family) who gave informed consent. Patient/family questions were addressed.  Follow-up:Pending Surgery                           Active Diagnosis and Procedures: 574.20 Calculus of gallbladder without mention of cholecystitis, without mention of obstruction   99203 - OFFICE OUTPATIENT NEW 30 MINUTES

## 2012-05-27 NOTE — Patient Instructions (Addendum)
Your procedure is scheduled on: 05/31/2012  Report to [facility] at 8:30    AM.  Call this number if you have problems the morning of surgery: [phone]   Remember:   Do not drink or eat food:After Midnight.  :  Take these medicines the morning of surgery with A SIP OF WATER: Lisinopril, Cymbalta  Do not wear jewelry, make-up or nail polish.  Do not wear lotions, powders, or perfumes. You may wear deodorant.  Do not shave 48 hours prior to surgery. Men may shave face and neck.  Do not bring valuables to the hospital.  Contacts, dentures or bridgework may not be worn into surgery.  Leave suitcase in the car. After surgery it may be brought to your room.  For patients admitted to the hospital, checkout time is 11:00 AM the day of discharge.   Patients discharged the day of surgery will not be allowed to drive home.    Special Instructions: Shower using CHG 2 nights before surgery and the night before surgery.  If you shower the day of surgery use CHG.  Use special wash - you have one bottle of CHG for all showers.  You should use approximately 1/3 of the bottle for each shower.   Please read over the following fact sheets that you were given: Pain Booklet, MRSA Information, Surgical Site Infection Prevention and Care and Recovery After Surgery   Laparoscopic Cholecystectomy Care After Refer to this sheet in the next few weeks. These instructions provide you with information on caring for yourself after your procedure. Your caregiver may also give you more specific instructions. Your treatment has been planned according to current medical practices, but problems sometimes occur. Call your caregiver if you have any problems or questions after your procedure. HOME CARE INSTRUCTIONS   Change bandages (dressings) as directed by your caregiver.  Keep the wound dry and clean. The wound may be washed gently with soap and water. Gently blot or dab the area dry.  Do not take baths or use swimming  pools or hot tubs for 10 days, or as instructed by your caregiver.  Only take over-the-counter or prescription medicines for pain, discomfort, or fever as directed by your caregiver.  Continue your normal diet as directed by your caregiver.  Do not lift anything heavier than 25 pounds (11.5 kg), or as directed by your caregiver.  Do not play contact sports for 1 week, or as directed by your caregiver. SEEK MEDICAL CARE IF:   There is redness, swelling, or increasing pain in the wound.  You notice yellowish-white fluid (pus) coming from the wound.  There is drainage from the wound that lasts longer than 1 day.  There is a bad smell coming from the wound or dressing.  The surgical cut (incision) breaks open. SEEK IMMEDIATE MEDICAL CARE IF:   You develop a rash.  You have difficulty breathing.  You develop chest pain.  You develop any reaction or side effects to medicines given.  You have a fever.  You have increasing pain in the shoulders (shoulder strap areas).  You have dizzy episodes or faint while standing.  You develop severe abdominal pain.  You feel sick to your stomach (nauseous) or throw up (vomit) and this lasts for more than 1 day. MAKE SURE YOU:   Understand these instructions.  Will watch your condition.  Will get help right away if you are not doing well or get worse. Document Released: 05/12/2005 Document Revised: 08/04/2011 Document Reviewed:  10/25/2010 ExitCare Patient Information 2013 ExitCare, [place] PATIENT INSTRUCTIONS POST-ANESTHESIA  IMMEDIATELY FOLLOWING SURGERY:  Do not drive or operate machinery for the first twenty four hours after surgery.  Do not make any important decisions for twenty four hours after surgery or while taking narcotic pain medications or sedatives.  If you develop intractable nausea and vomiting or a severe headache please notify your doctor immediately.  FOLLOW-UP:  Please make an appointment with your surgeon as  instructed. You do not need to follow up with anesthesia unless specifically instructed to do so.  WOUND CARE INSTRUCTIONS (if applicable):  Keep a dry clean dressing on the anesthesia/puncture wound site if there is drainage.  Once the wound has quit draining you may leave it open to air.  Generally you should leave the bandage intact for twenty four hours unless there is drainage.  If the epidural site drains for more than 36-48 hours please call the anesthesia department.  QUESTIONS?:  Please feel free to call your physician or the hospital operator if you have any questions, and they will be happy to assist you.      Marland Kitchen

## 2012-05-28 ENCOUNTER — Encounter (HOSPITAL_COMMUNITY): Payer: Self-pay

## 2012-05-28 ENCOUNTER — Encounter (HOSPITAL_COMMUNITY)
Admission: RE | Admit: 2012-05-28 | Discharge: 2012-05-28 | Disposition: A | Discharge status: Home / Self Care | Payer: Medicare Other | Source: Ambulatory Visit | Attending: General Surgery | Admitting: General Surgery

## 2012-05-28 ENCOUNTER — Other Ambulatory Visit: Payer: Self-pay

## 2012-05-28 ENCOUNTER — Inpatient Hospital Stay (HOSPITAL_COMMUNITY): Admission: RE | Admit: 2012-05-28 | Payer: Medicare Other | Source: Ambulatory Visit

## 2012-05-28 HISTORY — DX: Anemia, unspecified: D64.9

## 2012-05-28 HISTORY — DX: Essential (primary) hypertension: I10

## 2012-05-28 LAB — HEPATIC FUNCTION PANEL
ALT: 17 U/L (ref 0–35)
AST: 12 U/L (ref 0–37)
Albumin: 4 g/dL (ref 3.5–5.2)
Alkaline Phosphatase: 111 U/L (ref 39–117)
Bilirubin, Direct: 0.1 mg/dL (ref 0.0–0.3)
Indirect Bilirubin: 0.2 mg/dL — ABNORMAL LOW (ref 0.3–0.9)
Total Bilirubin: 0.3 mg/dL (ref 0.3–1.2)
Total Protein: 7.5 g/dL (ref 6.0–8.3)

## 2012-05-28 LAB — CBC WITH DIFFERENTIAL/PLATELET
Basophils Absolute: 0 10*3/uL (ref 0.0–0.1)
Basophils Relative: 0 % (ref 0–1)
Eosinophils Absolute: 0.1 10*3/uL (ref 0.0–0.7)
Eosinophils Relative: 1 % (ref 0–5)
HCT: 35.7 % — ABNORMAL LOW (ref 36.0–46.0)
Hemoglobin: 12.1 g/dL (ref 12.0–15.0)
Lymphocytes Relative: 18 % (ref 12–46)
Lymphs Abs: 1.4 10*3/uL (ref 0.7–4.0)
MCH: 28.1 pg (ref 26.0–34.0)
MCHC: 33.9 g/dL (ref 30.0–36.0)
MCV: 82.8 fL (ref 78.0–100.0)
Monocytes Absolute: 0.3 10*3/uL (ref 0.1–1.0)
Monocytes Relative: 3 % (ref 3–12)
Neutro Abs: 6.1 10*3/uL (ref 1.7–7.7)
Neutrophils Relative %: 78 % — ABNORMAL HIGH (ref 43–77)
Platelets: 308 10*3/uL (ref 150–400)
RBC: 4.31 MIL/uL (ref 3.87–5.11)
RDW: 12.8 % (ref 11.5–15.5)
WBC: 7.8 10*3/uL (ref 4.0–10.5)

## 2012-05-28 LAB — BASIC METABOLIC PANEL
BUN: 16 mg/dL (ref 6–23)
CO2: 24 mEq/L (ref 19–32)
Calcium: 9.7 mg/dL (ref 8.4–10.5)
Chloride: 91 mEq/L — ABNORMAL LOW (ref 96–112)
Creatinine, Ser: 0.96 mg/dL (ref 0.50–1.10)
GFR calc Af Amer: 84 mL/min — ABNORMAL LOW (ref >90)
GFR calc non Af Amer: 72 mL/min — ABNORMAL LOW (ref >90)
Potassium: 4.3 mEq/L (ref 3.5–5.1)
Sodium: 130 mEq/L — ABNORMAL LOW (ref 135–145)

## 2012-05-28 LAB — SURGICAL PCR SCREEN
MRSA, PCR: NEGATIVE
Staphylococcus aureus: POSITIVE — AB

## 2012-05-28 LAB — HCG, SERUM, QUALITATIVE: Preg, Serum: NEGATIVE

## 2012-05-28 NOTE — Progress Notes (Addendum)
Called patient to inform her that lab work showed 710 Glucose reading. Patient could not find meter to check glucose at this time. Her ride will bring her to the Emergency Department. Patient verbalized understanding. Patient stated that she was "feeling all right" at this time, but would come to the Emergency Department. Called her at [phone].

## 2012-05-31 ENCOUNTER — Ambulatory Visit (HOSPITAL_COMMUNITY): Admission: RE | Admit: 2012-05-31 | Payer: Medicare Other | Source: Ambulatory Visit | Admitting: General Surgery

## 2012-05-31 SURGERY — LAPAROSCOPIC CHOLECYSTECTOMY
Anesthesia: General

## 2012-06-01 ENCOUNTER — Encounter (HOSPITAL_COMMUNITY): Payer: Self-pay | Admitting: Pharmacy Technician

## 2012-06-01 NOTE — OR Nursing (Signed)
Pt. notified  of SA positive and aware to be here at 0700.

## 2012-06-02 ENCOUNTER — Encounter (HOSPITAL_COMMUNITY): Payer: Self-pay | Admitting: Anesthesiology

## 2012-06-02 ENCOUNTER — Observation Stay (HOSPITAL_COMMUNITY)
Admission: RE | Admit: 2012-06-02 | Discharge: 2012-06-03 | Disposition: A | Discharge status: Home / Self Care | Payer: Medicare Other | Source: Ambulatory Visit | Attending: General Surgery | Admitting: General Surgery

## 2012-06-02 ENCOUNTER — Ambulatory Visit (HOSPITAL_COMMUNITY): Payer: Medicare Other | Admitting: Anesthesiology

## 2012-06-02 ENCOUNTER — Encounter (HOSPITAL_COMMUNITY)
Admission: RE | Disposition: A | Discharge status: Home / Self Care | Payer: Self-pay | Source: Ambulatory Visit | Attending: General Surgery

## 2012-06-02 ENCOUNTER — Encounter (HOSPITAL_COMMUNITY): Payer: Self-pay | Admitting: *Deleted

## 2012-06-02 DIAGNOSIS — Z23 Encounter for immunization: Secondary | ICD-10-CM | POA: Insufficient documentation

## 2012-06-02 DIAGNOSIS — Z5331 Laparoscopic surgical procedure converted to open procedure: Secondary | ICD-10-CM | POA: Insufficient documentation

## 2012-06-02 DIAGNOSIS — E119 Type 2 diabetes mellitus without complications: Secondary | ICD-10-CM | POA: Insufficient documentation

## 2012-06-02 DIAGNOSIS — K8010 Calculus of gallbladder with chronic cholecystitis without obstruction: Principal | ICD-10-CM | POA: Insufficient documentation

## 2012-06-02 DIAGNOSIS — Z01812 Encounter for preprocedural laboratory examination: Secondary | ICD-10-CM | POA: Insufficient documentation

## 2012-06-02 DIAGNOSIS — I10 Essential (primary) hypertension: Secondary | ICD-10-CM | POA: Insufficient documentation

## 2012-06-02 DIAGNOSIS — J4489 Other specified chronic obstructive pulmonary disease: Secondary | ICD-10-CM | POA: Insufficient documentation

## 2012-06-02 DIAGNOSIS — J449 Chronic obstructive pulmonary disease, unspecified: Secondary | ICD-10-CM | POA: Insufficient documentation

## 2012-06-02 HISTORY — PX: CHOLECYSTECTOMY: SHX55

## 2012-06-02 LAB — HEPATIC FUNCTION PANEL
ALT: 192 U/L — ABNORMAL HIGH (ref 0–35)
AST: 343 U/L — ABNORMAL HIGH (ref 0–37)
Albumin: 3 g/dL — ABNORMAL LOW (ref 3.5–5.2)
Alkaline Phosphatase: 73 U/L (ref 39–117)
Bilirubin, Direct: <0.1 mg/dL (ref 0.0–0.3)
Total Bilirubin: 0.2 mg/dL — ABNORMAL LOW (ref 0.3–1.2)
Total Protein: 6.2 g/dL (ref 6.0–8.3)

## 2012-06-02 LAB — GLUCOSE, CAPILLARY
Glucose-Capillary: 78 mg/dL (ref 70–99)
Glucose-Capillary: 91 mg/dL (ref 70–99)
Glucose-Capillary: 86 mg/dL (ref 70–99)
Glucose-Capillary: 199 mg/dL — ABNORMAL HIGH (ref 70–99)

## 2012-06-02 SURGERY — LAPAROSCOPIC CHOLECYSTECTOMY
Anesthesia: General | Site: Abdomen | Wound class: Clean Contaminated

## 2012-06-02 MED ORDER — FENTANYL CITRATE 0.05 MG/ML IJ SOLN
INTRAMUSCULAR | Status: DC | PRN
  Administered 2012-06-02: 50 ug via INTRAVENOUS
  Administered 2012-06-02: 150 ug via INTRAVENOUS
  Administered 2012-06-02: 50 ug via INTRAVENOUS
  Administered 2012-06-02: 100 ug via INTRAVENOUS

## 2012-06-02 MED ORDER — ROCURONIUM BROMIDE 50 MG/5ML IV SOLN
INTRAVENOUS | Status: AC
  Filled 2012-06-02: qty 1

## 2012-06-02 MED ORDER — METOPROLOL TARTRATE 1 MG/ML IV SOLN
INTRAVENOUS | Status: AC
  Filled 2012-06-02: qty 5

## 2012-06-02 MED ORDER — NEOSTIGMINE METHYLSULFATE 1 MG/ML IJ SOLN
INTRAMUSCULAR | Status: AC
  Filled 2012-06-02: qty 1

## 2012-06-02 MED ORDER — MIDAZOLAM HCL 2 MG/2ML IJ SOLN
1.0000 mg | INTRAMUSCULAR | Status: DC | PRN
  Administered 2012-06-02: 2 mg via INTRAVENOUS

## 2012-06-02 MED ORDER — ACETAMINOPHEN 10 MG/ML IV SOLN
1000.0000 mg | Freq: Four times a day (QID) | INTRAVENOUS | Status: DC
  Administered 2012-06-02 – 2012-06-03 (×3): 1000 mg via INTRAVENOUS
  Filled 2012-06-02 (×4): qty 100

## 2012-06-02 MED ORDER — POVIDONE-IODINE 10 % EX OINT
TOPICAL_OINTMENT | CUTANEOUS | Status: AC
  Filled 2012-06-02: qty 2

## 2012-06-02 MED ORDER — ROCURONIUM BROMIDE 100 MG/10ML IV SOLN
INTRAVENOUS | Status: DC | PRN
  Administered 2012-06-02: 30 mg via INTRAVENOUS
  Administered 2012-06-02 (×2): 10 mg via INTRAVENOUS

## 2012-06-02 MED ORDER — EXENATIDE 10 MCG/0.04ML ~~LOC~~ SOPN
10.0000 ug | PEN_INJECTOR | Freq: Two times a day (BID) | SUBCUTANEOUS | Status: DC
  Administered 2012-06-03: 10 ug via SUBCUTANEOUS
  Filled 2012-06-02: qty 2.4

## 2012-06-02 MED ORDER — FENTANYL CITRATE 0.05 MG/ML IJ SOLN
INTRAMUSCULAR | Status: AC
  Filled 2012-06-02: qty 5

## 2012-06-02 MED ORDER — LITHIUM CARBONATE 150 MG PO CAPS
600.0000 mg | ORAL_CAPSULE | Freq: Every day | ORAL | Status: DC
  Administered 2012-06-02: 600 mg via ORAL
  Filled 2012-06-02: qty 4

## 2012-06-02 MED ORDER — TRAZODONE HCL 50 MG PO TABS
200.0000 mg | ORAL_TABLET | Freq: Every day | ORAL | Status: DC
  Administered 2012-06-02: 200 mg via ORAL
  Filled 2012-06-02: qty 4

## 2012-06-02 MED ORDER — HEMOSTATIC AGENTS (NO CHARGE) OPTIME
TOPICAL | Status: DC | PRN
  Administered 2012-06-02 (×2): 1 via TOPICAL

## 2012-06-02 MED ORDER — ONDANSETRON HCL 4 MG PO TABS: 4.0000 mg | ORAL_TABLET | Freq: Four times a day (QID) | ORAL | Status: DC | PRN

## 2012-06-02 MED ORDER — ONDANSETRON HCL 4 MG/2ML IJ SOLN: 4.0000 mg | Freq: Four times a day (QID) | INTRAMUSCULAR | Status: DC | PRN

## 2012-06-02 MED ORDER — GLYCOPYRROLATE 0.2 MG/ML IJ SOLN
INTRAMUSCULAR | Status: AC
  Filled 2012-06-02: qty 1

## 2012-06-02 MED ORDER — LISINOPRIL 10 MG PO TABS: 10.0000 mg | ORAL_TABLET | Freq: Every day | ORAL | Status: DC

## 2012-06-02 MED ORDER — QUETIAPINE FUMARATE ER 200 MG PO TB24
800.0000 mg | ORAL_TABLET | Freq: Every evening | ORAL | Status: DC
  Filled 2012-06-02 (×2): qty 2

## 2012-06-02 MED ORDER — PNEUMOCOCCAL VAC POLYVALENT 25 MCG/0.5ML IJ INJ
0.5000 mL | INJECTION | INTRAMUSCULAR | Status: AC
  Administered 2012-06-03: 0.5 mL via INTRAMUSCULAR
  Filled 2012-06-02: qty 0.5

## 2012-06-02 MED ORDER — HYDROMORPHONE HCL PF 1 MG/ML IJ SOLN
1.0000 mg | INTRAMUSCULAR | Status: DC | PRN
  Administered 2012-06-02 (×2): 1 mg via INTRAVENOUS
  Filled 2012-06-02 (×2): qty 1

## 2012-06-02 MED ORDER — ENOXAPARIN SODIUM 40 MG/0.4ML ~~LOC~~ SOLN
SUBCUTANEOUS | Status: AC
  Filled 2012-06-02: qty 0.4

## 2012-06-02 MED ORDER — GLYCOPYRROLATE 0.2 MG/ML IJ SOLN
0.2000 mg | Freq: Once | INTRAMUSCULAR | Status: AC
  Administered 2012-06-02: 0.2 mg via INTRAVENOUS

## 2012-06-02 MED ORDER — SODIUM CHLORIDE 0.9 % IR SOLN
Status: DC | PRN
  Administered 2012-06-02: 1000 mL

## 2012-06-02 MED ORDER — GLYCOPYRROLATE 0.2 MG/ML IJ SOLN
INTRAMUSCULAR | Status: AC
  Filled 2012-06-02: qty 3

## 2012-06-02 MED ORDER — SUCCINYLCHOLINE CHLORIDE 20 MG/ML IJ SOLN
INTRAMUSCULAR | Status: AC
  Filled 2012-06-02: qty 1

## 2012-06-02 MED ORDER — FENTANYL CITRATE 0.05 MG/ML IJ SOLN
INTRAMUSCULAR | Status: AC
  Filled 2012-06-02: qty 2

## 2012-06-02 MED ORDER — DEXTROSE 5 % IV SOLN
2.0000 g | INTRAVENOUS | Status: DC | PRN
  Administered 2012-06-02: 2 g via INTRAVENOUS

## 2012-06-02 MED ORDER — LISINOPRIL 5 MG PO TABS: 2.5000 mg | ORAL_TABLET | Freq: Every day | ORAL | Status: DC

## 2012-06-02 MED ORDER — PROPOFOL 10 MG/ML IV EMUL
INTRAVENOUS | Status: DC | PRN
  Administered 2012-06-02: 150 mg via INTRAVENOUS

## 2012-06-02 MED ORDER — LIDOCAINE HCL (PF) 1 % IJ SOLN
INTRAMUSCULAR | Status: AC
  Filled 2012-06-02: qty 5

## 2012-06-02 MED ORDER — INSULIN ASPART 100 UNIT/ML ~~LOC~~ SOLN
0.0000 [IU] | Freq: Three times a day (TID) | SUBCUTANEOUS | Status: DC
  Administered 2012-06-03: 8 [IU] via SUBCUTANEOUS

## 2012-06-02 MED ORDER — INFLUENZA VIRUS VACC SPLIT PF IM SUSP
0.5000 mL | INTRAMUSCULAR | Status: AC
  Administered 2012-06-03: 0.5 mL via INTRAMUSCULAR
  Filled 2012-06-02: qty 0.5

## 2012-06-02 MED ORDER — FENTANYL CITRATE 0.05 MG/ML IJ SOLN: 25.0000 ug | INTRAMUSCULAR | Status: DC | PRN

## 2012-06-02 MED ORDER — ARTIFICIAL TEARS OP OINT
TOPICAL_OINTMENT | OPHTHALMIC | Status: AC
  Filled 2012-06-02: qty 3.5

## 2012-06-02 MED ORDER — DEXTROSE 5 % IV SOLN
2.0000 g | Freq: Once | INTRAVENOUS | Status: DC
  Filled 2012-06-02 (×2): qty 2

## 2012-06-02 MED ORDER — LACTATED RINGERS IV SOLN
INTRAVENOUS | Status: DC
  Administered 2012-06-02: 1000 mL via INTRAVENOUS

## 2012-06-02 MED ORDER — ENOXAPARIN SODIUM 40 MG/0.4ML ~~LOC~~ SOLN
40.0000 mg | SUBCUTANEOUS | Status: DC
  Administered 2012-06-03: 40 mg via SUBCUTANEOUS
  Filled 2012-06-02: qty 0.4

## 2012-06-02 MED ORDER — MIDAZOLAM HCL 2 MG/2ML IJ SOLN
INTRAMUSCULAR | Status: AC
  Filled 2012-06-02: qty 2

## 2012-06-02 MED ORDER — NEOSTIGMINE METHYLSULFATE 1 MG/ML IJ SOLN
INTRAMUSCULAR | Status: DC | PRN
  Administered 2012-06-02: 4 mg via INTRAVENOUS

## 2012-06-02 MED ORDER — MUPIROCIN 2 % EX OINT
TOPICAL_OINTMENT | Freq: Two times a day (BID) | CUTANEOUS | Status: DC
  Administered 2012-06-02: 1 via NASAL

## 2012-06-02 MED ORDER — NALOXONE HCL 0.4 MG/ML IJ SOLN
INTRAMUSCULAR | Status: AC
  Filled 2012-06-02: qty 1

## 2012-06-02 MED ORDER — CHLORHEXIDINE GLUCONATE 4 % EX LIQD: 1.0000 "application " | Freq: Once | CUTANEOUS | Status: DC

## 2012-06-02 MED ORDER — LISINOPRIL 10 MG PO TABS
20.0000 mg | ORAL_TABLET | Freq: Every day | ORAL | Status: DC
  Administered 2012-06-03: 20 mg via ORAL
  Filled 2012-06-02: qty 2

## 2012-06-02 MED ORDER — PROPOFOL 10 MG/ML IV EMUL
INTRAVENOUS | Status: AC
  Filled 2012-06-02: qty 20

## 2012-06-02 MED ORDER — HYDRALAZINE HCL 20 MG/ML IJ SOLN
INTRAMUSCULAR | Status: AC
  Filled 2012-06-02: qty 1

## 2012-06-02 MED ORDER — BUPIVACAINE HCL (PF) 0.5 % IJ SOLN
INTRAMUSCULAR | Status: DC | PRN
  Administered 2012-06-02: 15 mL

## 2012-06-02 MED ORDER — LIDOCAINE HCL (CARDIAC) 10 MG/ML IV SOLN
INTRAVENOUS | Status: DC | PRN
Start: 2012-06-02 — End: 2012-06-02
  Administered 2012-06-02: 50 mg via INTRAVENOUS

## 2012-06-02 MED ORDER — ONDANSETRON HCL 4 MG/2ML IJ SOLN
INTRAMUSCULAR | Status: AC
  Filled 2012-06-02: qty 2

## 2012-06-02 MED ORDER — OLANZAPINE 5 MG PO TABS
20.0000 mg | ORAL_TABLET | Freq: Every day | ORAL | Status: DC
  Administered 2012-06-03: 20 mg via ORAL
  Filled 2012-06-02: qty 4

## 2012-06-02 MED ORDER — ENOXAPARIN SODIUM 40 MG/0.4ML ~~LOC~~ SOLN
40.0000 mg | Freq: Once | SUBCUTANEOUS | Status: AC
  Administered 2012-06-02: 40 mg via SUBCUTANEOUS

## 2012-06-02 MED ORDER — HYDRALAZINE HCL 20 MG/ML IJ SOLN
5.0000 mg | INTRAMUSCULAR | Status: DC | PRN
  Administered 2012-06-02: 5 mg via INTRAVENOUS

## 2012-06-02 MED ORDER — CLONIDINE HCL 0.1 MG PO TABS
0.1000 mg | ORAL_TABLET | Freq: Two times a day (BID) | ORAL | Status: DC
  Filled 2012-06-02 (×3): qty 1

## 2012-06-02 MED ORDER — METOPROLOL TARTRATE 1 MG/ML IV SOLN
INTRAVENOUS | Status: DC | PRN
  Administered 2012-06-02: 1 mg via INTRAVENOUS
  Administered 2012-06-02: 5 mg via INTRAVENOUS
  Administered 2012-06-02: 1 mg via INTRAVENOUS
  Administered 2012-06-02: 2 mg via INTRAVENOUS

## 2012-06-02 MED ORDER — NALOXONE HCL 0.4 MG/ML IJ SOLN
INTRAMUSCULAR | Status: DC | PRN
  Administered 2012-06-02: 80 ug via INTRAVENOUS
  Administered 2012-06-02: 40 ug via INTRAVENOUS

## 2012-06-02 MED ORDER — DULOXETINE HCL 60 MG PO CPEP
60.0000 mg | ORAL_CAPSULE | Freq: Two times a day (BID) | ORAL | Status: DC
  Administered 2012-06-02 – 2012-06-03 (×2): 60 mg via ORAL
  Filled 2012-06-02 (×2): qty 1

## 2012-06-02 MED ORDER — MUPIROCIN 2 % EX OINT
TOPICAL_OINTMENT | CUTANEOUS | Status: AC
  Filled 2012-06-02: qty 22

## 2012-06-02 MED ORDER — SUCCINYLCHOLINE CHLORIDE 20 MG/ML IJ SOLN
INTRAMUSCULAR | Status: DC | PRN
  Administered 2012-06-02: 100 mg via INTRAVENOUS

## 2012-06-02 MED ORDER — GLYCOPYRROLATE 0.2 MG/ML IJ SOLN
INTRAMUSCULAR | Status: DC | PRN
  Administered 2012-06-02: 0.6 mg via INTRAVENOUS

## 2012-06-02 MED ORDER — ONDANSETRON HCL 4 MG/2ML IJ SOLN
4.0000 mg | Freq: Once | INTRAMUSCULAR | Status: AC
  Administered 2012-06-02: 4 mg via INTRAVENOUS

## 2012-06-02 MED ORDER — LACTATED RINGERS IV SOLN
INTRAVENOUS | Status: DC
  Administered 2012-06-02: 18:00:00 via INTRAVENOUS

## 2012-06-02 MED ORDER — LACTATED RINGERS IV SOLN
INTRAVENOUS | Status: DC | PRN
  Administered 2012-06-02 (×2): via INTRAVENOUS

## 2012-06-02 MED ORDER — ONDANSETRON HCL 4 MG/2ML IJ SOLN: 4.0000 mg | Freq: Once | INTRAMUSCULAR | Status: DC | PRN

## 2012-06-02 SURGICAL SUPPLY — 45 items
APPLIER CLIP 13 LRG OPEN (CLIP) ×2
APPLIER CLIP LAPSCP 10X32 DD (CLIP) ×2 IMPLANT
BAG HAMPER (MISCELLANEOUS) ×2 IMPLANT
BLADE SURG SZ10 CARB STEEL (BLADE) ×4 IMPLANT
CLEANER TIP ELECTROSURG 2X2 (MISCELLANEOUS) ×2 IMPLANT
CLIP APPLIE 13 LRG OPEN (CLIP) ×1 IMPLANT
CLOTH BEACON ORANGE TIMEOUT ST (SAFETY) ×2 IMPLANT
COVER LIGHT HANDLE STERIS (MISCELLANEOUS) ×4 IMPLANT
DECANTER SPIKE VIAL GLASS SM (MISCELLANEOUS) ×2 IMPLANT
DRSG GAUZE PETRO 3X36 STRIP (GAUZE/BANDAGES/DRESSINGS) ×2 IMPLANT
DRSG TEGADERM 2-3/8X2-3/4 SM (GAUZE/BANDAGES/DRESSINGS) ×2 IMPLANT
DURAPREP 26ML APPLICATOR (WOUND CARE) ×2 IMPLANT
ELECT BLADE 6 FLAT ULTRCLN (ELECTRODE) ×2 IMPLANT
ELECT REM PT RETURN 9FT ADLT (ELECTROSURGICAL) ×2
ELECTRODE REM PT RTRN 9FT ADLT (ELECTROSURGICAL) ×1 IMPLANT
FILTER SMOKE EVAC LAPAROSHD (FILTER) ×2 IMPLANT
FORMALIN 10 PREFIL 120ML (MISCELLANEOUS) ×2 IMPLANT
GLOVE BIO SURGEON STRL SZ7.5 (GLOVE) ×2 IMPLANT
GLOVE BIOGEL PI IND STRL 7.0 (GLOVE) ×2 IMPLANT
GLOVE BIOGEL PI INDICATOR 7.0 (GLOVE) ×2
GLOVE ECLIPSE 6.5 STRL STRAW (GLOVE) ×4 IMPLANT
GOWN STRL REIN XL XLG (GOWN DISPOSABLE) ×6 IMPLANT
HEMOSTAT SNOW SURGICEL 2X4 (HEMOSTASIS) ×4 IMPLANT
INST SET LAPROSCOPIC AP (KITS) ×2 IMPLANT
KIT ROOM TURNOVER APOR (KITS) ×2 IMPLANT
KIT TROCAR LAP CHOLE (TROCAR) ×2 IMPLANT
MANIFOLD NEPTUNE II (INSTRUMENTS) ×2 IMPLANT
NS IRRIG 1000ML POUR BTL (IV SOLUTION) ×2 IMPLANT
PACK LAP CHOLE LZT030E (CUSTOM PROCEDURE TRAY) ×2 IMPLANT
PAD ARMBOARD 7.5X6 YLW CONV (MISCELLANEOUS) ×2 IMPLANT
PENCIL HANDSWITCHING (ELECTRODE) ×2 IMPLANT
POUCH SPECIMEN RETRIEVAL 10MM (ENDOMECHANICALS) ×2 IMPLANT
SET BASIN LINEN APH (SET/KITS/TRAYS/PACK) ×2 IMPLANT
SPONGE GAUZE 2X2 8PLY STRL LF (GAUZE/BANDAGES/DRESSINGS) ×8 IMPLANT
SPONGE GAUZE 4X4 12PLY (GAUZE/BANDAGES/DRESSINGS) ×2 IMPLANT
SPONGE INTESTINAL PEANUT (DISPOSABLE) ×4 IMPLANT
SPONGE LAP 18X18 X RAY DECT (DISPOSABLE) ×4 IMPLANT
STAPLER VISISTAT (STAPLE) ×2 IMPLANT
SUT VIC AB 0 CT1 27 (SUTURE) ×2
SUT VIC AB 0 CT1 27XCR 8 STRN (SUTURE) ×2 IMPLANT
SUT VICRYL 0 UR6 27IN ABS (SUTURE) ×2 IMPLANT
TAPE CLOTH SURG 4X10 WHT LF (GAUZE/BANDAGES/DRESSINGS) ×2 IMPLANT
WARMER LAPAROSCOPE (MISCELLANEOUS) ×2 IMPLANT
YANKAUER SUCT 12FT TUBE ARGYLE (SUCTIONS) ×2 IMPLANT
YANKAUER SUCT BULB TIP 10FT TU (MISCELLANEOUS) ×2 IMPLANT

## 2012-06-02 NOTE — Progress Notes (Signed)
Pt started on Mupricin ointment nasal treatment for SA positive results with instructions given verbally and sheet given for home use. Verbalized understanding.

## 2012-06-02 NOTE — Transfer of Care (Signed)
  Anesthesia Post-op Note  Patient: Kathryn [name]  Procedure(s) Performed: Procedure(s) (LRB) with comments: LAPAROSCOPIC CHOLECYSTECTOMY (N/A) - Attempted laparoscopic cholecystectomy CHOLECYSTECTOMY (N/A) - converted to open at  0905  Patient Location: PACU  Anesthesia Type: General  Level of Consciousness: awake, alert , oriented and patient cooperative  Airway and Oxygen Therapy: Patient Spontanous Breathing and Patient connected to face mask oxygen  Post-op Pain: mild  Post-op Assessment: Post-op Vital signs reviewed, Patient's Cardiovascular Status Stable, Respiratory Function Stable, Patent Airway and No signs of Nausea or vomiting  Post-op Vital Signs: Reviewed and stable  Complications: No apparent anesthesia complications  

## 2012-06-02 NOTE — Op Note (Signed)
Patient:  Kathryn [name]  DOB:  Nov 15, 1970  MRN:  [id number]   Preop Diagnosis:  Cholecystitis, cholelithiasis  Postop Diagnosis:  Same  Procedure:  Open cholecystectomy, attempted laparoscopic  Surgeon:  Franky Macho, M.D.  Anes:  General endotracheal  Indications:  Patient is a 42 year old black female with uncontrolled diabetes mellitus who presents for surgery due to cholecystitis, cholelithiasis. The risks and benefits of the procedure including bleeding, infection, hepatobiliary injury, and the possibility of an open procedure were fully explained to the patient, who gave informed consent.  Procedure note:  The patient was placed in the supine position. After induction of general endotracheal anesthesia, the abdomen was prepped and draped using usual sterile technique with DuraPrep. Surgical site confirmation was performed.  A supraumbilical incision was made down the fascia. Initially, I attempted to insufflate the abdomen with a Veress needle. I then tried an open technique to insert the trocar. The abdomen cannot be insufflated safely do to multiple adhesions of omentum to the abdominal wall as well as the inability to fully inflate the abdomen to facilitate exposure of the gallbladder. It was elected to proceed with an open procedure. The laparoscope was used to inspect the bowel in the area of the umbilicus and was no evidence of injury. A right upper quadrant incision was made down to the peritoneal layer. The perineum was entered into without difficulty. The liver appear to be within normal limits, though mildly enlarged. The gallbladder was dissected NA retrograde dome down approach to the infundibulum. The cystic duct and cystic artery were both identified easily. The dissection performed resulted in the cystic artery and duct as well as the gallbladder being on a pedicle. Clips were placed proximally distally on both structures and divided. The gallbladder was then removed from the  operative field. The liver bed was inspected and any bleeding was controlled using Bovie electrocautery. The wound was irrigated normal saline. Surgicel was placed in the gallbladder fossa. No bile leakage was noted. The posterior fascia was reapproximated using an 0 Vicryl running suture. The anterior abdominal layer was reapproximated using 0 Vicryl interrupted sutures. The skin was closed using staples. The supraumbilical fascia was reapproximated using 0 Vicryl interrupted sutures. All skin incisions were closed using staples. 0.5% Sensorcaine was instilled in both wounds. Betadine ointment dry sterile dressings were applied.  All tape and needle counts were correct at the end of the procedure. The patient was extubated in the operating room and transferred to PACU in stable condition.  Complications:  None  EBL:  Minimal  Specimen:  Gallbladder

## 2012-06-02 NOTE — Progress Notes (Signed)
Subjective: Patient was post cholecystectomy. During the procedure her B/p was elevated and she was treated with IV hydralazine. She is also started on clonidine. Patient is resting. Her B/P is being controlled. Patient has no complaint.   Objective: Vital signs in last 24 hours: Temp:  [98 F (36.7 C)-98.9 F (37.2 C)] 98.9 F (37.2 C) (01/08 1424) Pulse Rate:  [84-100] 89  (01/08 1424) Resp:  [16-24] 20  (01/08 1424) BP: (108-195)/(67-102) 108/75 mmHg (01/08 1424) SpO2:  [96 %-100 %] 100 % (01/08 1424) Weight change:     Intake/Output from previous day:    PHYSICAL EXAM General appearance: alert and no distress Resp: clear to auscultation bilaterally Cardio: S1, S2 normal GI: soft and lax, incision site dressed, bowel sound+ Extremities: extremities normal, atraumatic, no cyanosis or edema  Lab Results:    @labtest @ [facility] No results found for this basename: PHART,PCO2,PO2ART,TCO2,HCO3 in the last 72 hours CULTURES Recent Results (from the past 240 hour(s))  SURGICAL PCR SCREEN     Status: Abnormal   Collection Time   05/28/12 12:04 PM      Component Value Range Status Comment   MRSA, PCR NEGATIVE  NEGATIVE Final    Staphylococcus aureus POSITIVE (*) NEGATIVE Final    Studies/Results: No results found.  Medications: I have reviewed the patient's current medications.  Assesment: S/P Open cholecystectomy Hypertension DM type II Bipolar disease Active Problems:  * No active hospital problems. *     Plan: Medications reviewed Continue PRN hydralazine D/c clonidine We will increase lisinopril to 20 mg po daily Continue regular treatment.    LOS: 0 days   Euretha Najarro 06/02/2012, 2:36 PM

## 2012-06-02 NOTE — Anesthesia Preprocedure Evaluation (Signed)
Anesthesia Evaluation  Patient identified by MRN, date of birth, ID band Patient awake    Reviewed: Allergy & Precautions, H&P , NPO status , Patient's Chart, lab work & pertinent test results  Airway Mallampati: III TM Distance: >3 FB   Mouth opening: Limited Mouth Opening  Dental  (+) Teeth Intact   Pulmonary neg pulmonary ROS, COPD breath sounds clear to auscultation        Cardiovascular hypertension, Pt. on medications Rhythm:Regular Rate:Normal     Neuro/Psych PSYCHIATRIC DISORDERS Anxiety Depression Bipolar Disorder    GI/Hepatic   Endo/Other  diabetes, Type 2, Oral Hypoglycemic Agents  Renal/GU      Musculoskeletal   Abdominal   Peds  Hematology   Anesthesia Other Findings   Reproductive/Obstetrics                           Anesthesia Physical Anesthesia Plan  ASA: III  Anesthesia Plan: General   Post-op Pain Management:    Induction: Intravenous and Rapid sequence  Airway Management Planned: Oral ETT  Additional Equipment:   Intra-op Plan:   Post-operative Plan: Extubation in OR  Informed Consent: I have reviewed the patients History and Physical, chart, labs and discussed the procedure including the risks, benefits and alternatives for the proposed anesthesia with the patient or authorized representative who has indicated his/her understanding and acceptance.     Plan Discussed with:   Anesthesia Plan Comments:         Anesthesia Quick Evaluation

## 2012-06-02 NOTE — Addendum Note (Signed)
Addendum  created 06/02/12 1039 by Laurene Footman, MD   Modules edited:Orders

## 2012-06-02 NOTE — Anesthesia Postprocedure Evaluation (Signed)
  Anesthesia Post-op Note  Patient: Kathryn [name]  Procedure(s) Performed: Procedure(s) (LRB) with comments: LAPAROSCOPIC CHOLECYSTECTOMY (N/A) - Attempted laparoscopic cholecystectomy CHOLECYSTECTOMY (N/A) - converted to open at  0905  Patient Location: PACU  Anesthesia Type: General  Level of Consciousness: awake, alert , oriented and patient cooperative  Airway and Oxygen Therapy: Patient Spontanous Breathing and Patient connected to face mask oxygen  Post-op Pain: mild  Post-op Assessment: Post-op Vital signs reviewed, Patient's Cardiovascular Status Stable, Respiratory Function Stable, Patent Airway and No signs of Nausea or vomiting  Post-op Vital Signs: Reviewed and stable  Complications: No apparent anesthesia complications

## 2012-06-02 NOTE — Preoperative (Signed)
Beta Blockers   Reason not to administer Beta Blockers:Not Applicable 

## 2012-06-02 NOTE — Interval H&P Note (Signed)
History and Physical Interval Note:  06/02/2012 8:29 AM  Kathryn [name]  has presented today for surgery, with the diagnosis of CHOLELITHISIS  The various methods of treatment have been discussed with the patient and family. After consideration of risks, benefits and other options for treatment, the patient has consented to  Procedure(s) (LRB) with comments: LAPAROSCOPIC CHOLECYSTECTOMY (N/A) as a surgical intervention .  The patient's history has been reviewed, patient examined, no change in status, stable for surgery.  I have reviewed the patient's chart and labs.  Questions were answered to the patient's satisfaction.     Franky Macho A

## 2012-06-02 NOTE — Anesthesia Procedure Notes (Signed)
Procedure Name: Intubation Date/Time: 06/02/2012 8:40 AM Performed by: Carolyne Littles, Detrick Dani L Pre-anesthesia Checklist: Patient identified, Patient being monitored, Timeout performed, Emergency Drugs available and Suction available Patient Re-evaluated:Patient Re-evaluated prior to inductionOxygen Delivery Method: Circle System Utilized Preoxygenation: Pre-oxygenation with 100% oxygen Intubation Type: IV induction, Cricoid Pressure applied and Rapid sequence Laryngoscope Size: 3 and Miller Grade View: Grade I Tube type: Oral Tube size: 7.0 mm Number of attempts: 1 Airway Equipment and Method: stylet Placement Confirmation: ETT inserted through vocal cords under direct vision,  positive ETCO2 and breath sounds checked- equal and bilateral Secured at: 21 cm Tube secured with: Tape Dental Injury: Teeth and Oropharynx as per pre-operative assessment

## 2012-06-02 NOTE — H&P (View-Only) (Signed)
  NTS SOAP Note  Vital Signs:  Vitals as of: 05/27/2012: Systolic 144: Diastolic 93: Heart Rate 104: Temp 97.3F: Height 5ft 0in: Weight 172Lbs 0 Ounces: Pain Level 9: BMI 34  BMI : 33.59 kg/m2  Subjective: This 42 Years 4 Months old Female presents for of    ABDOMINAL ISSUES: ,Has been having abdominal pain in right upper quadrant, nausea, and bloating for some time now.  Made worse with food.  U/S of gallbladder reveals cholelithiasis vs polyp.  No fever, chills, jaundice.  Review of Symptoms:  Constitutional:unremarkable   Head:unremarkable    Eyes:unremarkable   Nose/Mouth/Throat:unremarkable Cardiovascular:  unremarkable   Respiratory:unremarkable   Gastrointestin    abdominal pain,nausea Genitourinary:unremarkable     Musculoskeletal:unremarkable   Skin:unremarkable Hematolgic/Lymphatic:unremarkable     Allergic/Immunologic:unremarkable     Past Medical History:    Reviewed   Past Medical History  Surgical History: none Medical Problems: bipolar Allergies: nkda Medications: olanzapine, cymbalta, seroquel, trazadone, gabapentin, lithium   Social History:Reviewed  Social History  Preferred Language: English Ethnicity: Not Hispanic / Latino Age: 42 Years 4 Months   Smoking Status: Never smoker reviewed on 05/27/2012 Functional Status reviewed on mm/dd/yyyy ------------------------------------------------ Bathing: Normal Cooking: Normal Dressing: Normal Driving: Normal Eating: Normal Managing Meds: Normal Oral Care: Normal Shopping: Normal Toileting: Normal Transferring: Normal Walking: Normal Cognitive Status reviewed on mm/dd/yyyy ------------------------------------------------ Attention: Normal Decision Making: Normal Language: Normal Memory: Normal Motor: Normal Perception: Normal Problem Solving: Normal Visual and Spatial: Normal   Family History:  Reviewed   Family History  Is there a family  history of:noncontributory    Objective Information: General:  Well appearing, well nourished in no distress.   no scleral icterus Heart:  RRR, no murmur Lungs:    CTA bilaterally, no wheezes, rhonchi, rales.  Breathing unlabored. Abdomen:Soft, NT/ND, no HSM, no masses.  Assessment:Biliary colic, cholelithiasis  Diagnosis &amp; Procedure:    Plan:Scheduled for laparoscopic cholecystectomy on 05/31/12.   Patient Education:Alternative treatments to surgery were discussed with patient (and family).  Risks and benefits  of procedure including bleeding, infection, hepatobiliary injury, and the possibility of an open procedure were fully explained to the patient (and family) who gave informed consent. Patient/family questions were addressed.  Follow-up:Pending Surgery                           Active Diagnosis and Procedures: 574.20 Calculus of gallbladder without mention of cholecystitis, without mention of obstruction   99203 - OFFICE OUTPATIENT NEW 30 MINUTES    

## 2012-06-03 LAB — BASIC METABOLIC PANEL
BUN: 6 mg/dL (ref 6–23)
CO2: 25 mEq/L (ref 19–32)
Calcium: 8.3 mg/dL — ABNORMAL LOW (ref 8.4–10.5)
Chloride: 102 mEq/L (ref 96–112)
Creatinine, Ser: 0.87 mg/dL (ref 0.50–1.10)
GFR calc Af Amer: >90 mL/min (ref >90)
GFR calc non Af Amer: 82 mL/min — ABNORMAL LOW (ref >90)
Glucose, Bld: 245 mg/dL — ABNORMAL HIGH (ref 70–99)
Potassium: 3.5 mEq/L (ref 3.5–5.1)
Sodium: 138 mEq/L (ref 135–145)

## 2012-06-03 LAB — PHOSPHORUS: Phosphorus: 3.4 mg/dL (ref 2.3–4.6)

## 2012-06-03 LAB — GLUCOSE, CAPILLARY: Glucose-Capillary: 265 mg/dL — ABNORMAL HIGH (ref 70–99)

## 2012-06-03 LAB — HEMOGLOBIN A1C
Hgb A1c MFr Bld: 11.6 % — ABNORMAL HIGH (ref <5.7)
Mean Plasma Glucose: 286 mg/dL — ABNORMAL HIGH (ref <117)

## 2012-06-03 LAB — MAGNESIUM: Magnesium: 1.5 mg/dL (ref 1.5–2.5)

## 2012-06-03 MED ORDER — LISINOPRIL 2.5 MG PO TABS: 2.5000 mg | ORAL_TABLET | Freq: Two times a day (BID) | ORAL | Status: DC

## 2012-06-03 MED ORDER — OXYCODONE-ACETAMINOPHEN 7.5-325 MG PO TABS: 1.0000 | ORAL_TABLET | ORAL | Status: DC | PRN

## 2012-06-03 NOTE — Discharge Summary (Signed)
Physician Discharge Summary  Patient ID: ARCOLA FRESHOUR MRN: [id number] DOB/AGE: [birth year] 42 y.o.  Admit date: 06/02/2012 Discharge date: 06/03/2012  Admission Diagnoses: Cholecystitis, cholelithiasis, uncontrolled diabetes mellitus  Discharge Diagnoses: Same, essential hypertension Active Problems:  * No active hospital problems. *    Discharged Condition: good  Hospital Course: Patient is a 42 year old black female who presented for laparoscopic cholecystectomy on 06/02/2012. Her surgery has been delayed previously do to uncontrolled diabetes mellitus. She underwent an open cholecystectomy, attempted laparoscopic on 06/02/2012. I was unable to do a laparoscopic approach do to body habitus. She tolerated the procedure well. Postoperative course was remarkable for hypertension. Dr. Felecia Shelling, primary care for this patient, adjusted her antihypertensives. Her diet was advanced without difficulty. She is being discharged home in good and stable condition.  Treatments: surgery: Open cholecystectomy on 06/02/2012  Discharge Exam: Blood pressure 152/92, pulse 111, temperature 99.1 F (37.3 C), temperature source Oral, resp. rate 20, last menstrual period 05/08/2012, SpO2 98.00%. General appearance: alert, cooperative and no distress Resp: clear to auscultation bilaterally Cardio: regular rate and rhythm, S1, S2 normal, no murmur, click, rub or gallop GI: Soft. Dressings dry and intact. Bowel sounds appreciated.  Disposition: 01-Home or Self Care     Medication List     As of 06/03/2012  8:41 AM    TAKE these medications         aspirin 81 MG tablet   Take 81 mg by mouth at bedtime.      BYETTA 10 MCG PEN 10 MCG/0.04ML Soln   Generic drug: exenatide   Inject 10 mcg into the skin 2 (two) times daily with a meal.      DULoxetine 60 MG capsule   Commonly known as: CYMBALTA   Take 60 mg by mouth 2 (two) times daily.      lisinopril 2.5 MG tablet   Commonly known as: PRINIVIL,ZESTRIL     Take 1 tablet (2.5 mg total) by mouth 2 (two) times daily.      lithium carbonate 300 MG capsule   Take 600 mg by mouth at bedtime.      naproxen 500 MG tablet   Commonly known as: NAPROSYN   Take 500 mg by mouth 2 (two) times daily as needed. FOR PAIN      OLANZapine 20 MG tablet   Commonly known as: ZYPREXA   Take 20 mg by mouth daily.      oxyCODONE-acetaminophen 7.5-325 MG per tablet   Commonly known as: PERCOCET   Take 1-2 tablets by mouth every 4 (four) hours as needed for pain.      POTASSIUM BICARBONATE PO   Take 1 tablet by mouth daily.      QUEtiapine 400 MG 24 hr tablet   Commonly known as: SEROQUEL XR   Take 800 mg by mouth every evening. AT 6PM      simvastatin 20 MG tablet   Commonly known as: ZOCOR   Take 20 mg by mouth daily.      traZODone 100 MG tablet   Commonly known as: DESYREL   Take 200 mg by mouth at bedtime.      Vitamin D 400 UNITS capsule   Take 400 Units by mouth daily.           Follow-up Information    Follow up with Dalia Heading, MD. Schedule an appointment as soon as possible for a visit on 06/10/2012.   Contact information:   [address] [place] [address] [phone]  SignedFranky Macho A 06/03/2012, 8:41 AM

## 2012-06-03 NOTE — Progress Notes (Signed)
Subjective: Patient feels better. She is started on oral feeding. Her B/p is improving.   Objective: Vital signs in last 24 hours: Temp:  [98 F (36.7 C)-99.3 F (37.4 C)] 99.1 F (37.3 C) (01/09 1610) Pulse Rate:  [84-120] 111  (01/09 0632) Resp:  [16-23] 20  (01/09 9604) BP: (108-195)/(59-92) 146/81 mmHg (01/09 0632) SpO2:  [90 %-100 %] 98 % (01/09 5409) Weight change:  Last BM Date: 05/31/12  Intake/Output from previous day: 01/08 0701 - 01/09 0700 In: 2310 [P.O.:410; I.V.:1900] Out: 50 [Blood:50]  PHYSICAL EXAM General appearance: alert and no distress Resp: clear to auscultation bilaterally Cardio: S1, S2 normal GI: soft and lax, incision site dressed, bowel sound+ Extremities: extremities normal, atraumatic, no cyanosis or edema  Lab Results:    @labtest @ [facility] No results found for this basename: PHART,PCO2,PO2ART,TCO2,HCO3 in the last 72 hours CULTURES Recent Results (from the past 240 hour(s))  SURGICAL PCR SCREEN     Status: Abnormal   Collection Time   05/28/12 12:04 PM      Component Value Range Status Comment   MRSA, PCR NEGATIVE  NEGATIVE Final    Staphylococcus aureus POSITIVE (*) NEGATIVE Final    Studies/Results: No results found.  Medications: I have reviewed the patient's current medications.  Assesment: S/P Open cholecystectomy Hypertension DM type II Bipolar disease Active Problems:  * No active hospital problems. *     Plan: Medications reviewed Continue PRN hydralazine Continue regular treatment. We will follow patient in the office    LOS: 1 day   Kathryn [name] 06/03/2012, 8:20 AM

## 2012-06-03 NOTE — Anesthesia Postprocedure Evaluation (Signed)
  Anesthesia Post-op Note  Patient: [name] A Scow  Procedure(s) Performed: Procedure(s) (LRB) with comments: LAPAROSCOPIC CHOLECYSTECTOMY (N/A) - Attempted laparoscopic cholecystectomy CHOLECYSTECTOMY (N/A) - converted to open at  0905  Patient Location: room 323  Anesthesia Type:General  Level of Consciousness: awake, alert , oriented and patient cooperative  Airway and Oxygen Therapy: Patient Spontanous Breathing  Post-op Pain: 3 /10, mild  Post-op Assessment: Post-op Vital signs reviewed, Patient's Cardiovascular Status Stable, Respiratory Function Stable, Patent Airway, No signs of Nausea or vomiting, Adequate PO intake and Pain level controlled  Post-op Vital Signs: Reviewed and stable  Complications: No apparent anesthesia complications

## 2012-06-03 NOTE — Addendum Note (Signed)
Addendum  created 06/03/12 1106 by Despina Hidden, CRNA   Modules edited:Notes Section

## 2012-06-03 NOTE — Progress Notes (Signed)
UR chart review completed.  

## 2012-06-03 NOTE — Progress Notes (Signed)
Patient with orders to be discharge home. Discharge instructions given, patient verbalized understanding. Patient in stable condition upon discharge. Patient left with family via private vehicle.

## 2012-06-04 ENCOUNTER — Encounter (HOSPITAL_COMMUNITY): Payer: Self-pay | Admitting: General Surgery

## 2012-07-19 ENCOUNTER — Other Ambulatory Visit (HOSPITAL_COMMUNITY): Payer: Self-pay | Admitting: Internal Medicine

## 2012-07-19 DIAGNOSIS — Z139 Encounter for screening, unspecified: Secondary | ICD-10-CM

## 2012-07-22 ENCOUNTER — Ambulatory Visit (HOSPITAL_COMMUNITY)
Admission: RE | Admit: 2012-07-22 | Discharge: 2012-07-22 | Disposition: A | Discharge status: Home / Self Care | Payer: Medicare Other | Source: Ambulatory Visit | Attending: Internal Medicine | Admitting: Internal Medicine

## 2012-07-22 DIAGNOSIS — Z139 Encounter for screening, unspecified: Secondary | ICD-10-CM

## 2012-07-22 DIAGNOSIS — Z1231 Encounter for screening mammogram for malignant neoplasm of breast: Secondary | ICD-10-CM | POA: Insufficient documentation

## 2012-07-30 ENCOUNTER — Emergency Department (HOSPITAL_COMMUNITY): Payer: Medicare Other

## 2012-07-30 ENCOUNTER — Emergency Department (HOSPITAL_COMMUNITY)
Admission: EM | Admit: 2012-07-30 | Discharge: 2012-07-30 | Disposition: A | Discharge status: Home / Self Care | Payer: Medicare Other | Attending: Emergency Medicine | Admitting: Emergency Medicine

## 2012-07-30 ENCOUNTER — Encounter (HOSPITAL_COMMUNITY): Payer: Self-pay | Admitting: Emergency Medicine

## 2012-07-30 DIAGNOSIS — F319 Bipolar disorder, unspecified: Secondary | ICD-10-CM | POA: Insufficient documentation

## 2012-07-30 DIAGNOSIS — Y939 Activity, unspecified: Secondary | ICD-10-CM | POA: Insufficient documentation

## 2012-07-30 DIAGNOSIS — Z862 Personal history of diseases of the blood and blood-forming organs and certain disorders involving the immune mechanism: Secondary | ICD-10-CM | POA: Insufficient documentation

## 2012-07-30 DIAGNOSIS — Z79899 Other long term (current) drug therapy: Secondary | ICD-10-CM | POA: Insufficient documentation

## 2012-07-30 DIAGNOSIS — W1789XA Other fall from one level to another, initial encounter: Secondary | ICD-10-CM | POA: Insufficient documentation

## 2012-07-30 DIAGNOSIS — S8990XA Unspecified injury of unspecified lower leg, initial encounter: Secondary | ICD-10-CM | POA: Insufficient documentation

## 2012-07-30 DIAGNOSIS — I10 Essential (primary) hypertension: Secondary | ICD-10-CM | POA: Insufficient documentation

## 2012-07-30 DIAGNOSIS — Y9301 Activity, walking, marching and hiking: Secondary | ICD-10-CM | POA: Insufficient documentation

## 2012-07-30 DIAGNOSIS — R630 Anorexia: Secondary | ICD-10-CM | POA: Insufficient documentation

## 2012-07-30 DIAGNOSIS — E119 Type 2 diabetes mellitus without complications: Secondary | ICD-10-CM | POA: Insufficient documentation

## 2012-07-30 DIAGNOSIS — IMO0002 Reserved for concepts with insufficient information to code with codable children: Secondary | ICD-10-CM | POA: Insufficient documentation

## 2012-07-30 DIAGNOSIS — Y929 Unspecified place or not applicable: Secondary | ICD-10-CM | POA: Insufficient documentation

## 2012-07-30 DIAGNOSIS — F411 Generalized anxiety disorder: Secondary | ICD-10-CM | POA: Insufficient documentation

## 2012-07-30 DIAGNOSIS — Z7982 Long term (current) use of aspirin: Secondary | ICD-10-CM | POA: Insufficient documentation

## 2012-07-30 DIAGNOSIS — M549 Dorsalgia, unspecified: Secondary | ICD-10-CM

## 2012-07-30 MED ORDER — OXYCODONE-ACETAMINOPHEN 5-325 MG PO TABS
1.0000 | ORAL_TABLET | Freq: Once | ORAL | Status: AC
  Administered 2012-07-30: 1 via ORAL

## 2012-07-30 MED ORDER — OXYCODONE-ACETAMINOPHEN 5-325 MG PO TABS: 1.0000 | ORAL_TABLET | ORAL | Status: DC | PRN

## 2012-07-30 MED ORDER — CYCLOBENZAPRINE HCL 10 MG PO TABS: 10.0000 mg | ORAL_TABLET | Freq: Three times a day (TID) | ORAL | Status: DC | PRN

## 2012-07-30 MED ORDER — OXYCODONE-ACETAMINOPHEN 5-325 MG PO TABS
ORAL_TABLET | ORAL | Status: AC
  Administered 2012-07-30: 1 via ORAL
  Filled 2012-07-30: qty 1

## 2012-07-30 NOTE — ED Notes (Signed)
Patient with no complaints at this time. Respirations even and unlabored. Skin warm/dry. Discharge instructions reviewed with patient at this time. Patient given opportunity to voice concerns/ask questions. Patient discharged at this time and left Emergency Department with steady gait.   

## 2012-07-30 NOTE — ED Notes (Signed)
Pt states fell Wednesday while taking clothes inside.hurting to lower back and legs

## 2012-08-03 NOTE — ED Provider Notes (Signed)
History     CSN: [id number]  Arrival date & time 07/30/12  1304   First MD Initiated Contact with Patient 07/30/12 1319      Chief Complaint  Patient presents with  . Fall  . Back Pain  . Leg Pain    (Consider location/radiation/quality/duration/timing/severity/associated sxs/prior treatment) Patient is a 42 y.o. female presenting with fall and back pain. The history is provided by the patient.  Fall The accident occurred 2 days ago. The fall occurred while walking. She fell from an unknown height. She landed on grass. There was no blood loss. Point of impact: low back. Pain location: low back. The pain is mild. She was ambulatory at the scene. There was no entrapment after the fall. There was no drug use involved in the accident. There was no alcohol use involved in the accident. Pertinent negatives include no visual change, no fever, no numbness, no abdominal pain, no bowel incontinence, no nausea, no vomiting, no hematuria, no headaches, no loss of consciousness and no tingling. The symptoms are aggravated by ambulation, rotation and standing. She has tried nothing for the symptoms. The treatment provided no relief.  Back Pain Location:  Lumbar spine Quality:  Aching Radiates to:  L posterior upper leg, R posterior upper leg, L thigh and R thigh Pain severity:  Moderate Onset quality:  Sudden Timing:  Constant Progression:  Unchanged Chronicity:  New Context: falling   Relieved by:  Being still Worsened by:  Ambulation, movement, twisting, standing and bending Ineffective treatments:  None tried Associated symptoms: leg pain and weight loss   Associated symptoms: no abdominal pain, no abdominal swelling, no bladder incontinence, no bowel incontinence, no chest pain, no dysuria, no fever, no headaches, no numbness, no pelvic pain, no perianal numbness, no tingling and no weakness     Past Medical History  Diagnosis Date  . Diabetes mellitus   . Bipolar disorder   .  Depression   . Anxiety   . Hypertension   . Anemia     Past Surgical History  Procedure Laterality Date  . Cesarean section    . Breast surgery    . Tubal ligation      X3  . Cholecystectomy  06/02/2012    Procedure: LAPAROSCOPIC CHOLECYSTECTOMY;  Surgeon: Dalia Heading, MD;  Location: [facility];  Service: General;  Laterality: N/A;  Attempted laparoscopic cholecystectomy  . Cholecystectomy  06/02/2012    Procedure: CHOLECYSTECTOMY;  Surgeon: Dalia Heading, MD;  Location: [facility];  Service: General;  Laterality: N/A;  converted to open at  0905    No family history on file.  History  Substance Use Topics  . Smoking status: Never Smoker   . Smokeless tobacco: Not on file  . Alcohol Use: No    OB History   Grav Para Term Preterm Abortions TAB SAB Ect Mult Living                  Review of Systems  Constitutional: Positive for weight loss. Negative for fever.  Respiratory: Negative for shortness of breath.   Cardiovascular: Negative for chest pain.  Gastrointestinal: Negative for nausea, vomiting, abdominal pain, constipation and bowel incontinence.  Genitourinary: Negative for bladder incontinence, dysuria, hematuria, flank pain, decreased urine volume, difficulty urinating and pelvic pain.       No perineal numbness or incontinence of urine or feces  Musculoskeletal: Positive for back pain. Negative for joint swelling.  Skin: Negative for rash.  Neurological: Negative for tingling, loss  of consciousness, weakness, numbness and headaches.  All other systems reviewed and are negative.    Allergies  Review of patient's allergies indicates no known allergies.  Home Medications   Current Outpatient Rx  Name  Route  Sig  Dispense  Refill  . albuterol (PROVENTIL HFA;VENTOLIN HFA) 108 (90 BASE) MCG/ACT inhaler   Inhalation   Inhale 2 puffs into the lungs every 6 (six) hours as needed for wheezing.         Marland Kitchen aspirin 81 MG tablet   Oral   Take 81 mg by mouth at bedtime.            . Cholecalciferol (VITAMIN D) 400 UNITS capsule   Oral   Take 400 Units by mouth daily.           . DULoxetine (CYMBALTA) 60 MG capsule   Oral   Take 60 mg by mouth 2 (two) times daily.           Marland Kitchen exenatide (BYETTA 10 MCG PEN) 10 MCG/0.04ML SOLN   Subcutaneous   Inject 10 mcg into the skin 2 (two) times daily with a meal.          . lisinopril (PRINIVIL,ZESTRIL) 2.5 MG tablet   Oral   Take 1 tablet (2.5 mg total) by mouth 2 (two) times daily.   60 tablet   0   . lithium carbonate 300 MG capsule   Oral   Take 600 mg by mouth at bedtime.           Marland Kitchen OLANZapine (ZYPREXA) 20 MG tablet   Oral   Take 20 mg by mouth daily.           Marland Kitchen POTASSIUM BICARBONATE PO   Oral   Take 1 tablet by mouth daily.           . QUEtiapine (SEROQUEL XR) 400 MG 24 hr tablet   Oral   Take 800 mg by mouth every evening. AT 6PM          . simvastatin (ZOCOR) 20 MG tablet   Oral   Take 20 mg by mouth daily.           . traZODone (DESYREL) 100 MG tablet   Oral   Take 200 mg by mouth at bedtime.           . cyclobenzaprine (FLEXERIL) 10 MG tablet   Oral   Take 1 tablet (10 mg total) by mouth 3 (three) times daily as needed for muscle spasms.   21 tablet   0   . oxyCODONE-acetaminophen (PERCOCET/ROXICET) 5-325 MG per tablet   Oral   Take 1 tablet by mouth every 4 (four) hours as needed for pain.   20 tablet   0     BP 163/91  Pulse 115  Temp(Src) 97.5 F (36.4 C) (Oral)  Ht 5\' 2"  (1.575 m)  Wt 192 lb (87.091 kg)  BMI 35.11 kg/m2  SpO2 99%  LMP 06/28/2012  Physical Exam  Nursing note and vitals reviewed. Constitutional: She is oriented to person, place, and time. She appears well-developed and well-nourished. No distress.  HENT:  Head: Normocephalic and atraumatic.  Neck: Normal range of motion. Neck supple.  Cardiovascular: Normal rate, regular rhythm and intact distal pulses.   No murmur heard. Pulmonary/Chest: Effort normal and breath sounds  normal. She exhibits no tenderness.  Musculoskeletal: She exhibits tenderness. She exhibits no edema.       Lumbar back: She exhibits tenderness and pain.  She exhibits normal range of motion, no bony tenderness, no swelling, no deformity, no laceration and normal pulse.  ttp of the lumbar paraspinal muscles.  Distal sensation intact.  DP pulses brisk.  Pain reproduced with SLR bilaterally  Neurological: She is alert and oriented to person, place, and time. No cranial nerve deficit or sensory deficit. She exhibits normal muscle tone. Coordination and gait normal.  Reflex Scores:      Patellar reflexes are 2+ on the right side and 2+ on the left side.      Achilles reflexes are 2+ on the right side and 2+ on the left side. Skin: Skin is warm and dry.    ED Course  Procedures (including critical care time)  Labs Reviewed - No data to display No results found.   Dg Lumbar Spine Complete  07/30/2012  *RADIOLOGY REPORT*  Clinical Data: Fall, back and right leg pain  LUMBAR SPINE - COMPLETE 4+ VIEW  Comparison: 07/17/2010, 12/26/2009  Findings: Normal lumbar spine alignment.  No fracture, compression deformity, or focal kyphosis.  Preserved vertebral body heights and disc spaces. No pars defects.  Normal pedicles and SI joints.  Left abdominal calcification at the L3-4 level is consistent with a gonadal vein phlebolith by comparison CT.  Prior cholecystectomy evident.  Postop changes in the pelvis.  Nonobstructive bowel gas pattern.  IMPRESSION: No acute osseous finding.   Original Report Authenticated By: Judie Petit. Shick, M.D.      1. Back pain       MDM    Patient has ttp of the lumbar paraspinal muscles.  No focal neuro deficits on exam.  Ambulates with a steady gait. Doubt emergent neurological or infectious process.  Pt agrees to close f/u with his PMD  The patient appears reasonably screened and/or stabilized for discharge and I doubt any other medical condition or other [facility] requiring further  screening, evaluation, or treatment in the ED at this time prior to discharge.     Prescribed Flexeril Percocet #20    Cason Luffman L. Trisha Mangle, PA-C 08/03/12 2111

## 2012-08-09 NOTE — ED Provider Notes (Signed)
I personally performed the services described in this documentation, which was scribed in my presence. The recorded information has been reviewed and is accurate.  Donnetta Hutching, MD 08/09/12 [phone]

## 2012-09-15 ENCOUNTER — Ambulatory Visit: Payer: Medicare Other | Admitting: Orthopedic Surgery

## 2012-09-21 ENCOUNTER — Encounter: Payer: Self-pay | Admitting: Orthopedic Surgery

## 2012-09-21 ENCOUNTER — Ambulatory Visit (INDEPENDENT_AMBULATORY_CARE_PROVIDER_SITE_OTHER): Payer: Medicare Other | Admitting: Orthopedic Surgery

## 2012-09-21 VITALS — BP 140/76 | Ht 60.0 in | Wt 204.0 lb

## 2012-09-21 DIAGNOSIS — G8911 Acute pain due to trauma: Secondary | ICD-10-CM

## 2012-09-21 DIAGNOSIS — M545 Low back pain, unspecified: Secondary | ICD-10-CM

## 2012-09-21 MED ORDER — CYCLOBENZAPRINE HCL 10 MG PO TABS: 10.0000 mg | ORAL_TABLET | Freq: Three times a day (TID) | ORAL | Status: DC | PRN

## 2012-09-21 MED ORDER — IBUPROFEN 800 MG PO TABS: 800.0000 mg | ORAL_TABLET | Freq: Three times a day (TID) | ORAL | Status: DC | PRN

## 2012-09-21 NOTE — Progress Notes (Signed)
  Subjective:    Patient ID: Kathryn [name], female    DOB: 1971-01-18, 42 y.o.   MRN: [id number]  Back Pain This is a new (February during the snowstorm) problem. The current episode started more than 1 month ago. The problem occurs constantly. The problem is unchanged. The pain is present in the gluteal and lumbar spine. The quality of the pain is described as aching and stabbing. The pain does not radiate. The pain is at a severity of 8/10. The symptoms are aggravated by sitting, standing, twisting, position and bending. Stiffness is present in the morning and at night. Associated symptoms include numbness. Pertinent negatives include no abdominal pain, bladder incontinence, headaches, leg pain, pelvic pain or perianal numbness.      Review of Systems  Constitutional: Positive for unexpected weight change.  Eyes: Positive for visual disturbance.  Gastrointestinal: Negative for abdominal pain.  Genitourinary: Negative for bladder incontinence and pelvic pain.  Musculoskeletal: Positive for back pain.  Allergic/Immunologic: Positive for environmental allergies.  Neurological: Positive for numbness. Negative for headaches.  Psychiatric/Behavioral: The patient is nervous/anxious.        Depression  All other systems reviewed and are negative.       Objective:   Physical Exam  Vitals reviewed. Constitutional: She appears well-developed and well-nourished.  Moderate obesity  Cardiovascular:  Peripheral pulses are intact no swelling or varicose veins normal temperature no edema no tenderness both lower extremities  Musculoskeletal:       Right hip: Normal.       Left hip: Normal.       Cervical back: Normal.       Thoracic back: Normal.       Lumbar back: She exhibits decreased range of motion, tenderness, bony tenderness, pain and spasm. She exhibits no swelling, no edema, no deformity, no laceration and normal pulse.  Straight leg raises were normal. Motor exam normal. Hip knee  ankle stability normal. Muscle tone normal. No deformities in the lower extremities.  Lymphadenopathy:  Negative groin lymph nodes  Neurological: She is alert. She has normal reflexes. No sensory deficit. Coordination normal.  Skin: Skin is warm, dry and intact. She is not diaphoretic. No pallor.  Psychiatric: She has a normal mood and affect. Her speech is normal and behavior is normal. Judgment and thought content normal.          Assessment & Plan:  5 images were taken at the hospital images read as negative I saw some very minor degenerative changes  Acute low back pain due to trauma - Plan: Ambulatory referral to Physical Therapy, cyclobenzaprine (FLEXERIL) 10 MG tablet, ibuprofen (ADVIL,MOTRIN) 800 MG tablet   Plan physical therapy ibuprofen and Flexeril followup with [place] as needed no surgical pathology

## 2012-09-21 NOTE — Patient Instructions (Addendum)
Back Pain, Adult Low back pain is very common. About 1 in 5 people have back pain.The cause of low back pain is rarely dangerous. The pain often gets better over time.About half of people with a sudden onset of back pain feel better in just 2 weeks. About 8 in 10 people feel better by 6 weeks.  CAUSES Some common causes of back pain include:  Strain of the muscles or ligaments supporting the spine.  Wear and tear (degeneration) of the spinal discs.  Arthritis.  Direct injury to the back. DIAGNOSIS Most of the time, the direct cause of low back pain is not known.However, back pain can be treated effectively even when the exact cause of the pain is unknown.Answering your caregiver's questions about your overall health and symptoms is one of the most accurate ways to make sure the cause of your pain is not dangerous. If your caregiver needs more information, he or she may order lab work or imaging tests (X-rays or MRIs).However, even if imaging tests show changes in your back, this usually does not require surgery. HOME CARE INSTRUCTIONS For many people, back pain returns.Since low back pain is rarely dangerous, it is often a condition that people can learn to manageon their own.   Remain active. It is stressful on the back to sit or stand in one place. Do not sit, drive, or stand in one place for more than 30 minutes at a time. Take short walks on level surfaces as soon as pain allows.Try to increase the length of time you walk each day.  Do not stay in bed.Resting more than 1 or 2 days can delay your recovery.  Do not avoid exercise or work.Your body is made to move.It is not dangerous to be active, even though your back may hurt.Your back will likely heal faster if you return to being active before your pain is gone.  Pay attention to your body when you bend and lift. Many people have less discomfortwhen lifting if they bend their knees, keep the load close to their bodies,and  avoid twisting. Often, the most comfortable positions are those that put less stress on your recovering back.  Find a comfortable position to sleep. Use a firm mattress and lie on your side with your knees slightly bent. If you lie on your back, put a pillow under your knees.  Only take over-the-counter or prescription medicines as directed by your caregiver. Over-the-counter medicines to reduce pain and inflammation are often the most helpful.Your caregiver may prescribe muscle relaxant drugs.These medicines help dull your pain so you can more quickly return to your normal activities and healthy exercise.  Put ice on the injured area.  Put ice in a plastic bag.  Place a towel between your skin and the bag.  Leave the ice on for 15 to 20 minutes, 3 to 4 times a day for the first 2 to 3 days. After that, ice and heat may be alternated to reduce pain and spasms.  Ask your caregiver about trying back exercises and gentle massage. This may be of some benefit.  Avoid feeling anxious or stressed.Stress increases muscle tension and can worsen back pain.It is important to recognize when you are anxious or stressed and learn ways to manage it.Exercise is a great option. SEEK MEDICAL CARE IF:  You have pain that is not relieved with rest or medicine.  You have pain that does not improve in 1 week.  You have new symptoms.  You are generally   not feeling well. SEEK IMMEDIATE MEDICAL CARE IF:   You have pain that radiates from your back into your legs.  You develop new bowel or bladder control problems.  You have unusual weakness or numbness in your arms or legs.  You develop nausea or vomiting.  You develop abdominal pain.  You feel faint. Document Released: 05/12/2005 Document Revised: 11/11/2011 Document Reviewed: 09/30/2010 [facility] Patient Information 2013 [place], [place].   Call hospital to arrange PT

## 2012-09-28 ENCOUNTER — Ambulatory Visit (HOSPITAL_COMMUNITY): Payer: Medicare Other | Attending: Physical Therapy | Admitting: Physical Therapy

## 2012-10-05 ENCOUNTER — Ambulatory Visit (HOSPITAL_COMMUNITY): Payer: Medicare Other | Admitting: Physical Therapy

## 2013-01-11 ENCOUNTER — Emergency Department (HOSPITAL_COMMUNITY)
Admission: EM | Admit: 2013-01-11 | Discharge: 2013-01-11 | Disposition: A | Discharge status: Home / Self Care | Payer: Medicare Other | Attending: Emergency Medicine | Admitting: Emergency Medicine

## 2013-01-11 ENCOUNTER — Encounter (HOSPITAL_COMMUNITY): Payer: Self-pay | Admitting: *Deleted

## 2013-01-11 ENCOUNTER — Emergency Department (HOSPITAL_COMMUNITY): Payer: Medicare Other

## 2013-01-11 DIAGNOSIS — R202 Paresthesia of skin: Secondary | ICD-10-CM

## 2013-01-11 DIAGNOSIS — E119 Type 2 diabetes mellitus without complications: Secondary | ICD-10-CM | POA: Insufficient documentation

## 2013-01-11 DIAGNOSIS — R0602 Shortness of breath: Secondary | ICD-10-CM | POA: Insufficient documentation

## 2013-01-11 DIAGNOSIS — Z7982 Long term (current) use of aspirin: Secondary | ICD-10-CM | POA: Insufficient documentation

## 2013-01-11 DIAGNOSIS — I10 Essential (primary) hypertension: Secondary | ICD-10-CM | POA: Insufficient documentation

## 2013-01-11 DIAGNOSIS — M542 Cervicalgia: Secondary | ICD-10-CM | POA: Insufficient documentation

## 2013-01-11 DIAGNOSIS — Z862 Personal history of diseases of the blood and blood-forming organs and certain disorders involving the immune mechanism: Secondary | ICD-10-CM | POA: Insufficient documentation

## 2013-01-11 DIAGNOSIS — Z79899 Other long term (current) drug therapy: Secondary | ICD-10-CM | POA: Insufficient documentation

## 2013-01-11 DIAGNOSIS — R5381 Other malaise: Secondary | ICD-10-CM | POA: Insufficient documentation

## 2013-01-11 DIAGNOSIS — F319 Bipolar disorder, unspecified: Secondary | ICD-10-CM | POA: Insufficient documentation

## 2013-01-11 DIAGNOSIS — F411 Generalized anxiety disorder: Secondary | ICD-10-CM | POA: Insufficient documentation

## 2013-01-11 DIAGNOSIS — M2569 Stiffness of other specified joint, not elsewhere classified: Secondary | ICD-10-CM | POA: Insufficient documentation

## 2013-01-11 LAB — URINALYSIS, ROUTINE W REFLEX MICROSCOPIC
Bilirubin Urine: NEGATIVE
Glucose, UA: 500 mg/dL — AB
Hgb urine dipstick: NEGATIVE
Ketones, ur: 15 mg/dL — AB
Leukocytes, UA: NEGATIVE
Nitrite: NEGATIVE
Protein, ur: NEGATIVE mg/dL
Specific Gravity, Urine: >1.03 — ABNORMAL HIGH (ref 1.005–1.030)
Urobilinogen, UA: 0.2 mg/dL (ref 0.0–1.0)
pH: 5.5 (ref 5.0–8.0)

## 2013-01-11 LAB — CBC WITH DIFFERENTIAL/PLATELET
Basophils Absolute: 0 10*3/uL (ref 0.0–0.1)
Basophils Relative: 0 % (ref 0–1)
Eosinophils Absolute: 0.1 10*3/uL (ref 0.0–0.7)
Eosinophils Relative: 2 % (ref 0–5)
HCT: 36.3 % (ref 36.0–46.0)
Hemoglobin: 12.5 g/dL (ref 12.0–15.0)
Lymphocytes Relative: 41 % (ref 12–46)
Lymphs Abs: 2.7 10*3/uL (ref 0.7–4.0)
MCH: 28.2 pg (ref 26.0–34.0)
MCHC: 34.4 g/dL (ref 30.0–36.0)
MCV: 81.8 fL (ref 78.0–100.0)
Monocytes Absolute: 0.4 10*3/uL (ref 0.1–1.0)
Monocytes Relative: 6 % (ref 3–12)
Neutro Abs: 3.4 10*3/uL (ref 1.7–7.7)
Neutrophils Relative %: 51 % (ref 43–77)
Platelets: 281 10*3/uL (ref 150–400)
RBC: 4.44 MIL/uL (ref 3.87–5.11)
RDW: 13.1 % (ref 11.5–15.5)
WBC: 6.7 10*3/uL (ref 4.0–10.5)

## 2013-01-11 LAB — COMPREHENSIVE METABOLIC PANEL
ALT: 22 U/L (ref 0–35)
AST: 16 U/L (ref 0–37)
Albumin: 4 g/dL (ref 3.5–5.2)
Alkaline Phosphatase: 85 U/L (ref 39–117)
BUN: 14 mg/dL (ref 6–23)
CO2: 26 mEq/L (ref 19–32)
Calcium: 9.7 mg/dL (ref 8.4–10.5)
Chloride: 96 mEq/L (ref 96–112)
Creatinine, Ser: 0.71 mg/dL (ref 0.50–1.10)
GFR calc Af Amer: >90 mL/min (ref >90)
GFR calc non Af Amer: >90 mL/min (ref >90)
Glucose, Bld: 293 mg/dL — ABNORMAL HIGH (ref 70–99)
Potassium: 3.9 mEq/L (ref 3.5–5.1)
Sodium: 134 mEq/L — ABNORMAL LOW (ref 135–145)
Total Bilirubin: 0.5 mg/dL (ref 0.3–1.2)
Total Protein: 7.8 g/dL (ref 6.0–8.3)

## 2013-01-11 LAB — GLUCOSE, CAPILLARY: Glucose-Capillary: 283 mg/dL — ABNORMAL HIGH (ref 70–99)

## 2013-01-11 MED ORDER — OXYCODONE-ACETAMINOPHEN 5-325 MG PO TABS: 1.0000 | ORAL_TABLET | Freq: Four times a day (QID) | ORAL | Status: DC | PRN

## 2013-01-11 MED ORDER — MORPHINE SULFATE 4 MG/ML IJ SOLN
4.0000 mg | Freq: Once | INTRAMUSCULAR | Status: AC
  Administered 2013-01-11: 4 mg via INTRAVENOUS
  Filled 2013-01-11: qty 1

## 2013-01-11 MED ORDER — ONDANSETRON HCL 4 MG/2ML IJ SOLN
4.0000 mg | Freq: Once | INTRAMUSCULAR | Status: AC
  Administered 2013-01-11: 4 mg via INTRAVENOUS
  Filled 2013-01-11: qty 2

## 2013-01-11 NOTE — ED Notes (Addendum)
States that she has numbness that starts in her right lateral neck and extends down her right arm and right leg.  States that her fingertips feel numb and tingle.  States that her toes feel numb and tingle as well.  States that she had difficulty using her right arm and leg last night.  States that she has also had trouble swallowing for the last several days.

## 2013-01-11 NOTE — ED Notes (Addendum)
Numbness of rt side of body for 1 week., pain in rt shoulder.  Alert, No known injury.  No headache.   Pt says her cbgs have been high  [phone]

## 2013-01-11 NOTE — ED Notes (Signed)
Pt given d/c instructions, verbalized understanding of all, 1 script given.

## 2013-01-11 NOTE — ED Provider Notes (Signed)
CSN: [id number]     Arrival date & time 01/11/13  1243 History    This chart was scribed for [employer]. Rubin Payor, MD,  by Ashley Jacobs, ED Scribe. The patient was seen in room APA14/APA14 and the patient's care was started at 1:36 PM  First MD Initiated Contact with Patient 01/11/13 1334     Chief Complaint  Patient presents with  . Numbness   (Consider location/radiation/quality/duration/timing/severity/associated sxs/prior Treatment) HPI HPI Comments: Kathryn [name] is a 42 y.o. female who presents to the Emergency Department complaining of generalized numbess with the onset of neck pain. She mentions that the pain started in her neck and radiated throughout her entire right side. She denies any incident of falling or injury.With exertion pt states that she experiences some mild shoulder pain.  Pt reports walking with difficulty and having difficulty sleeping due to pain. Pt explains that the pain is worsened with palpitation or exertion. She denies injury or any previous similar episode. Nothing seems to relieve or worsen the numbness. She states taking Ibuprofen for pain. She also states falling 6 months ago.  Past Medical History  Diagnosis Date  . Diabetes mellitus   . Bipolar disorder   . Depression   . Anxiety   . Hypertension   . Anemia    Past Surgical History  Procedure Laterality Date  . Cesarean section    . Tubal ligation      X3  . Cholecystectomy  06/02/2012    Procedure: LAPAROSCOPIC CHOLECYSTECTOMY;  Surgeon: Dalia Heading, MD;  Location: [facility];  Service: General;  Laterality: N/A;  Attempted laparoscopic cholecystectomy  . Cholecystectomy  06/02/2012    Procedure: CHOLECYSTECTOMY;  Surgeon: Dalia Heading, MD;  Location: [facility];  Service: General;  Laterality: N/A;  converted to open at  0905  . Breast surgery     History reviewed. No pertinent family history. History  Substance Use Topics  . Smoking status: Never Smoker   . Smokeless tobacco: Not on file   . Alcohol Use: No   OB History   Grav Para Term Preterm Abortions TAB SAB Ect Mult Living                 Review of Systems  HENT: Positive for neck pain and neck stiffness.   Respiratory: Positive for shortness of breath. Negative for cough.   Gastrointestinal: Negative for nausea, vomiting and diarrhea.  Musculoskeletal: Positive for joint swelling.  Neurological: Positive for weakness and numbness.  All other systems reviewed and are negative.    Allergies  Review of patient's allergies indicates no known allergies.  Home Medications   Current Outpatient Rx  Name  Route  Sig  Dispense  Refill  . albuterol (PROVENTIL HFA;VENTOLIN HFA) 108 (90 BASE) MCG/ACT inhaler   Inhalation   Inhale 2 puffs into the lungs every 6 (six) hours as needed for wheezing.         Marland Kitchen aspirin 81 MG tablet   Oral   Take 81 mg by mouth at bedtime.           . Cholecalciferol (VITAMIN D) 400 UNITS capsule   Oral   Take 400 Units by mouth daily.           . DULoxetine (CYMBALTA) 60 MG capsule   Oral   Take 60 mg by mouth daily at 6 PM.          . exenatide (BYETTA 10 MCG PEN) 10 MCG/0.04ML SOLN  Subcutaneous   Inject 10 mcg into the skin 2 (two) times daily with a meal.          . ibuprofen (ADVIL,MOTRIN) 800 MG tablet   Oral   Take 1 tablet (800 mg total) by mouth every 8 (eight) hours as needed for pain.   90 tablet   5   . lisinopril (PRINIVIL,ZESTRIL) 2.5 MG tablet   Oral   Take 1 tablet (2.5 mg total) by mouth 2 (two) times daily.   60 tablet   0   . lithium carbonate 300 MG capsule   Oral   Take 600 mg by mouth 2 (two) times daily.          Marland Kitchen OLANZapine (ZYPREXA) 20 MG tablet   Oral   Take 20 mg by mouth daily at 6 PM.          . POTASSIUM BICARBONATE PO   Oral   Take 1 tablet by mouth daily at 6 PM.          . simvastatin (ZOCOR) 20 MG tablet   Oral   Take 20 mg by mouth daily.           . traZODone (DESYREL) 100 MG tablet   Oral   Take 100  mg by mouth at bedtime.          . cyclobenzaprine (FLEXERIL) 10 MG tablet   Oral   Take 1 tablet (10 mg total) by mouth 3 (three) times daily as needed for muscle spasms.   42 tablet   2   . oxyCODONE-acetaminophen (PERCOCET/ROXICET) 5-325 MG per tablet   Oral   Take 1-2 tablets by mouth every 6 (six) hours as needed for pain.   10 tablet   0    BP 163/94  Pulse 104  Temp(Src) 98.4 F (36.9 C) (Oral)  Resp 18  Ht 5\' 2"  (1.575 m)  Wt 176 lb (79.833 kg)  BMI 32.18 kg/m2  SpO2 98%  LMP 12/28/2012 Physical Exam  Nursing note and vitals reviewed. Constitutional: She is oriented to person, place, and time. She appears well-developed and well-nourished. No distress.  HENT:  Head: Normocephalic and atraumatic.  Mouth/Throat: Oropharynx is clear and moist.  Eyes: Conjunctivae and EOM are normal. Pupils are equal, round, and reactive to light. No scleral icterus.  Neck: Neck supple.  Cardiovascular: Normal rate, regular rhythm, normal heart sounds and intact distal pulses.   No murmur heard. Pulmonary/Chest: Effort normal and breath sounds normal. No stridor. No respiratory distress. She has no rales.  Abdominal: Soft. Bowel sounds are normal. She exhibits no distension. There is no tenderness. There is no rebound and no guarding.  Musculoskeletal: Normal range of motion.  No sensation in R side of body Able to raise arm but presents with pain Decreased grip strength Good flexion and extension but with difficulty Strength is good but with questionable effort  Neurological: She is alert and oriented to person, place, and time.  Skin: Skin is warm and dry. No rash noted.  Psychiatric: She has a normal mood and affect. Her behavior is normal.    ED Course  DIAGNOSTIC STUDIES: Oxygen Saturation is 98% on room air, normal by my interpretation.    COORDINATION OF CARE: 1:40 PM Discussed course of care with pt . Pt understands and agrees.  Procedures (including critical care  time)  Labs Reviewed  GLUCOSE, CAPILLARY - Abnormal; Notable for the following:    Glucose-Capillary 283 (*)    All other  components within normal limits  URINALYSIS, ROUTINE W REFLEX MICROSCOPIC - Abnormal; Notable for the following:    Specific Gravity, Urine >1.030 (*)    Glucose, UA 500 (*)    Ketones, ur 15 (*)    All other components within normal limits  COMPREHENSIVE METABOLIC PANEL - Abnormal; Notable for the following:    Sodium 134 (*)    Glucose, Bld 293 (*)    All other components within normal limits  CBC WITH DIFFERENTIAL   Mr Brain Wo Contrast  01/11/2013   CLINICAL DATA:  42 year old female with headache, numbness, right-sided pain. History of diabetes.  EXAM: MRI HEAD WITHOUT CONTRAST  TECHNIQUE: Multiplanar, multisequence MR imaging was performed. No intravenous contrast was administered.  COMPARISON:  Head CT without contrast 04/01/2003.  FINDINGS: Cerebral volume is normal. No restricted diffusion to suggest acute infarction. No midline shift, mass effect, evidence of mass lesion, ventriculomegaly, extra-axial collection or acute intracranial hemorrhage. Cervicomedullary junction and pituitary are within normal limits. Cervical spine findings are reported separately. Major intracranial vascular flow voids are preserved.  No cortical encephalomalacia. Wallace Cullens and white matter signal is within normal limits throughout the brain.  Visualized bone marrow signal is within normal limits. Visualized orbit soft tissues are within normal limits. Mild to moderate paranasal sinus mucosal thickening. Mastoids are clear. Negative scalp soft tissues.  IMPRESSION: Normal noncontrast MRI appearance of the brain.   Electronically Signed   By: Augusto Gamble   On: 01/11/2013 16:08   Mr Cervical Spine Wo Contrast  01/11/2013   CLINICAL DATA:  Headaches with neck pain and bilateral arm numbness. No known injury.  EXAM: MRI CERVICAL SPINE WITHOUT CONTRAST  TECHNIQUE: Multiplanar, multisequence MR  imaging was performed. No intravenous contrast was administered.  COMPARISON:  Report from cervical MRI 06/08/2001.  FINDINGS: Study is mildly motion degraded. There is a mild scoliosis. The lateral alignment is normal. The spinal canal is relatively small on a congenital basis. There is no evidence of acute fracture or paraspinal abnormality.  The craniocervical junction appears normal. The cervical cord is normal in signal and caliber. There are bilateral vertebral artery flow voids.  C2-3: No significant findings.  C3-4: Asymmetric uncinate spurring on the left contributes to mild left foraminal stenosis. There is no cord deformity.  C4-5: There is spondylosis with posterior osteophytes and an asymmetric broad-based right foraminal disc osteophyte complex. The AP diameter of the canal is narrowed to 9 mm. There is moderate right and mild left foraminal stenosis.  C5-6: There is a shallow right foraminal disc protrusion contributing to mild asymmetric right foraminal stenosis. There is no cord deformity or significant left foraminal stenosis.  C6-7: There is mild foraminal stenosis on the left secondary to asymmetric disc bulging and uncinate spurring. No cord deformity.  C7-T1: No significant findings.  IMPRESSION: 1. Mild multilevel spondylosis superimposed on a congenitally small spinal canal. There is mild central stenosis at C4-5 without cord deformity. 2. Broad-based right foraminal disc osteophyte complex at C4-5 moderately narrows the right foramen. 3. Mild right foraminal stenosis at C5-6 and mild left foraminal stenosis at C3-4 secondary to asymmetric uncinate spurring.   Electronically Signed   By: Roxy Horseman   On: 01/11/2013 16:10   1. Neck pain   2. Paresthesias     MDM  Patient with headache, neck pain, and numbness to entire right side of body. She has a somewhat variable examination. After pain medicine exam significantly improved. She was able to ambulate. She is able  to use right hand  freely. MRI was done and showed only some foraminal stenosis. Will followup PCP. Will give some pain medications. I personally performed the services described in this documentation, which was scribed in my presence. The recorded information has been reviewed and is accurate.     Kathryn [name]. Rubin Payor, MD 01/11/13 1731

## 2013-01-24 ENCOUNTER — Emergency Department (HOSPITAL_COMMUNITY)
Admission: EM | Admit: 2013-01-24 | Discharge: 2013-01-24 | Disposition: A | Discharge status: Home / Self Care | Payer: Medicare Other | Attending: Emergency Medicine | Admitting: Emergency Medicine

## 2013-01-24 ENCOUNTER — Encounter (HOSPITAL_COMMUNITY): Payer: Self-pay | Admitting: *Deleted

## 2013-01-24 ENCOUNTER — Emergency Department (HOSPITAL_COMMUNITY): Payer: Medicare Other

## 2013-01-24 DIAGNOSIS — Z7982 Long term (current) use of aspirin: Secondary | ICD-10-CM | POA: Insufficient documentation

## 2013-01-24 DIAGNOSIS — F411 Generalized anxiety disorder: Secondary | ICD-10-CM | POA: Insufficient documentation

## 2013-01-24 DIAGNOSIS — Z862 Personal history of diseases of the blood and blood-forming organs and certain disorders involving the immune mechanism: Secondary | ICD-10-CM | POA: Insufficient documentation

## 2013-01-24 DIAGNOSIS — S20229A Contusion of unspecified back wall of thorax, initial encounter: Secondary | ICD-10-CM | POA: Insufficient documentation

## 2013-01-24 DIAGNOSIS — S0003XA Contusion of scalp, initial encounter: Secondary | ICD-10-CM | POA: Insufficient documentation

## 2013-01-24 DIAGNOSIS — W06XXXA Fall from bed, initial encounter: Secondary | ICD-10-CM | POA: Insufficient documentation

## 2013-01-24 DIAGNOSIS — Y9289 Other specified places as the place of occurrence of the external cause: Secondary | ICD-10-CM | POA: Insufficient documentation

## 2013-01-24 DIAGNOSIS — E1142 Type 2 diabetes mellitus with diabetic polyneuropathy: Secondary | ICD-10-CM | POA: Insufficient documentation

## 2013-01-24 DIAGNOSIS — S20219A Contusion of unspecified front wall of thorax, initial encounter: Secondary | ICD-10-CM | POA: Insufficient documentation

## 2013-01-24 DIAGNOSIS — I10 Essential (primary) hypertension: Secondary | ICD-10-CM | POA: Insufficient documentation

## 2013-01-24 DIAGNOSIS — Y9389 Activity, other specified: Secondary | ICD-10-CM | POA: Insufficient documentation

## 2013-01-24 DIAGNOSIS — Z9181 History of falling: Secondary | ICD-10-CM | POA: Insufficient documentation

## 2013-01-24 DIAGNOSIS — Z79899 Other long term (current) drug therapy: Secondary | ICD-10-CM | POA: Insufficient documentation

## 2013-01-24 DIAGNOSIS — T148XXA Other injury of unspecified body region, initial encounter: Secondary | ICD-10-CM

## 2013-01-24 DIAGNOSIS — F319 Bipolar disorder, unspecified: Secondary | ICD-10-CM | POA: Insufficient documentation

## 2013-01-24 DIAGNOSIS — E1149 Type 2 diabetes mellitus with other diabetic neurological complication: Secondary | ICD-10-CM | POA: Insufficient documentation

## 2013-01-24 LAB — GLUCOSE, CAPILLARY: Glucose-Capillary: 274 mg/dL — ABNORMAL HIGH (ref 70–99)

## 2013-01-24 MED ORDER — CYCLOBENZAPRINE HCL 10 MG PO TABS: 10.0000 mg | ORAL_TABLET | Freq: Two times a day (BID) | ORAL | Status: DC | PRN

## 2013-01-24 MED ORDER — HYDROCODONE-ACETAMINOPHEN 5-325 MG PO TABS: 2.0000 | ORAL_TABLET | ORAL | Status: DC | PRN

## 2013-01-24 NOTE — ED Provider Notes (Signed)
CSN: [id number]     Arrival date & time 01/24/13  0617 History  This chart was scribed for Gilda Crease, MD by Bennett Scrape, ED Scribe. This patient was seen in room APA06/APA06 and the patient's care was started at 7:00 AM.   CC: Falls  The history is provided by the patient. No language interpreter was used.    HPI Comments: Kathryn [name] is a 42 y.o. female who presents to the Emergency Department complaining of multiple falls over the past 2 days. She reports that she was sleeping in bed and woke up on the floor twice 2 days ago. She reports a third fall this morning while getting up to go to the bathroom. She has a h/o DM and states that she felt off-balance at the time due to her neuropathy. She c/o associated bilateral posterior rib pain, neck pain and lower back pain. She reports SOB secondary to the pain but denies any acute SOB. She admits that she has been able to ambulate at her baseline since the falls. She denies LOC, head trauma and extremity pain.  Past Medical History  Diagnosis Date  . Diabetes mellitus   . Bipolar disorder   . Depression   . Anxiety   . Hypertension   . Anemia    Past Surgical History  Procedure Laterality Date  . Cesarean section    . Tubal ligation      X3  . Cholecystectomy  06/02/2012    Procedure: LAPAROSCOPIC CHOLECYSTECTOMY;  Surgeon: Dalia Heading, MD;  Location: [facility];  Service: General;  Laterality: N/A;  Attempted laparoscopic cholecystectomy  . Cholecystectomy  06/02/2012    Procedure: CHOLECYSTECTOMY;  Surgeon: Dalia Heading, MD;  Location: [facility];  Service: General;  Laterality: N/A;  converted to open at  0905  . Breast surgery     History reviewed. No pertinent family history. History  Substance Use Topics  . Smoking status: Never Smoker   . Smokeless tobacco: Not on file  . Alcohol Use: No   No OB history provided.  Review of Systems  HENT: Positive for neck pain. Negative for neck stiffness.   Cardiovascular:  Positive for chest pain (bilateral posterior rib pain). Negative for leg swelling.  Musculoskeletal: Positive for back pain (lower). Negative for gait problem.  All other systems reviewed and are negative.    Allergies  Review of patient's allergies indicates no known allergies.  Home Medications   Current Outpatient Rx  Name  Route  Sig  Dispense  Refill  . albuterol (PROVENTIL HFA;VENTOLIN HFA) 108 (90 BASE) MCG/ACT inhaler   Inhalation   Inhale 2 puffs into the lungs every 6 (six) hours as needed for wheezing.         Marland Kitchen aspirin 81 MG tablet   Oral   Take 81 mg by mouth at bedtime.           . Cholecalciferol (VITAMIN D) 400 UNITS capsule   Oral   Take 400 Units by mouth daily.           . cyclobenzaprine (FLEXERIL) 10 MG tablet   Oral   Take 1 tablet (10 mg total) by mouth 3 (three) times daily as needed for muscle spasms.   42 tablet   2   . DULoxetine (CYMBALTA) 60 MG capsule   Oral   Take 60 mg by mouth daily at 6 PM.          . exenatide (BYETTA 10 MCG PEN)  10 MCG/0.04ML SOLN   Subcutaneous   Inject 10 mcg into the skin 2 (two) times daily with a meal.          . ibuprofen (ADVIL,MOTRIN) 800 MG tablet   Oral   Take 1 tablet (800 mg total) by mouth every 8 (eight) hours as needed for pain.   90 tablet   5   . lisinopril (PRINIVIL,ZESTRIL) 2.5 MG tablet   Oral   Take 1 tablet (2.5 mg total) by mouth 2 (two) times daily.   60 tablet   0   . lithium carbonate 300 MG capsule   Oral   Take 600 mg by mouth 2 (two) times daily.          Marland Kitchen OLANZapine (ZYPREXA) 20 MG tablet   Oral   Take 20 mg by mouth daily at 6 PM.          . oxyCODONE-acetaminophen (PERCOCET/ROXICET) 5-325 MG per tablet   Oral   Take 1-2 tablets by mouth every 6 (six) hours as needed for pain.   10 tablet   0   . POTASSIUM BICARBONATE PO   Oral   Take 1 tablet by mouth daily at 6 PM.          . simvastatin (ZOCOR) 20 MG tablet   Oral   Take 20 mg by mouth daily.            . traZODone (DESYREL) 100 MG tablet   Oral   Take 100 mg by mouth at bedtime.           Triage Vitals: BP 168/100  Pulse 97  Temp(Src) 97.8 F (36.6 C) (Oral)  Resp 20  Ht 5\' 2"  (1.575 m)  Wt 167 lb (75.751 kg)  BMI 30.54 kg/m2  SpO2 97%  LMP 12/28/2012  Physical Exam  Nursing note and vitals reviewed. Constitutional: She is oriented to person, place, and time. She appears well-developed and well-nourished. No distress.  HENT:  Head: Normocephalic and atraumatic.  Right Ear: Hearing normal.  Left Ear: Hearing normal.  Nose: Nose normal.  Mouth/Throat: Oropharynx is clear and moist and mucous membranes are normal.  Eyes: Conjunctivae and EOM are normal. Pupils are equal, round, and reactive to light.  Neck: Normal range of motion. Neck supple. Muscular tenderness present.  Mild tenderness to the cervical spine, no step offs  Cardiovascular: Normal rate, regular rhythm, S1 normal and S2 normal.  Exam reveals no gallop and no friction rub.   No murmur heard. Pulmonary/Chest: Effort normal and breath sounds normal. No respiratory distress. She exhibits tenderness.  Bilateral posterior rib pain, no crepitus   Abdominal: Soft. Normal appearance and bowel sounds are normal. There is no hepatosplenomegaly. There is no tenderness. There is no rebound, no guarding, no tenderness at McBurney's point and negative Murphy's sign. No hernia.  Musculoskeletal: Normal range of motion.       Lumbar back: She exhibits tenderness. She exhibits normal range of motion and no bony tenderness.  Mild tenderness to the paraspinous muscles of the lumbar back, no step offs.  Neurological: She is alert and oriented to person, place, and time. She has normal strength. No cranial nerve deficit or sensory deficit. Coordination normal. GCS eye subscore is 4. GCS verbal subscore is 5. GCS motor subscore is 6.  Skin: Skin is warm, dry and intact. No rash noted. No cyanosis.  Psychiatric: She has a  normal mood and affect. Her speech is normal and behavior is normal. Thought  content normal.    ED Course  Procedures (including critical care time)  DIAGNOSTIC STUDIES: Oxygen Saturation is 97% on room air, normal by my interpretation.    COORDINATION OF CARE: 7:04 AM-Discussed treatment plan which includes x-rays with pt at bedside and pt agreed to plan.   Labs Reviewed - No data to display Imaging Review Dg Ribs Bilateral W/chest  01/24/2013   *RADIOLOGY REPORT*  Clinical Data: Chest pain following injury  BILATERAL RIBS AND CHEST - 4+ VIEW  Comparison: None.  Findings: The cardiac shadow is within normal limits.  The lungs are clear bilaterally.  No acute rib fracture is noted.  No pneumothorax is noted.  IMPRESSION: No acute abnormality noted.   Original Report Authenticated By: Alcide Clever, M.D.   Dg Cervical Spine Complete  01/24/2013   *RADIOLOGY REPORT*  Clinical Data: Neck pain following recent injury  CERVICAL SPINE - COMPLETE 4+ VIEW  Comparison: None.  Findings: Seven cervical segments are well visualized.  Vertebral body height is well-maintained.  Mild disc space narrowing is noted.  No significant neural foraminal narrowing is noted.  Mild facet hypertrophic changes are seen.  The odontoid is within normal limits.  IMPRESSION: Mild degenerative change without acute abnormality.   Original Report Authenticated By: Alcide Clever, M.D.   Dg Lumbar Spine Complete  01/24/2013   *RADIOLOGY REPORT*  Clinical Data: Low back pain  LUMBAR SPINE - COMPLETE 4+ VIEW  Comparison: 07/30/2012  Findings: Five lumbar type vertebral bodies are well visualized. Vertebral body height is well-maintained.  No spondylolysis or spondylolisthesis is seen.  No soft tissue abnormality is noted.  IMPRESSION: No acute abnormalities seen.   Original Report Authenticated By: Alcide Clever, M.D.    MDM  Diagnosis: Multiple contusions, status post fall  Patient presents to the ER for evaluation after multiple  falls. Patient reports falling out of bed and then this morning becoming off-balance and falling once again. No loss of consciousness. No headache. Patient complaining of bilateral posterior rib pain as well as pain in the posterior neck region and lower back. She has normal neurologic examination. X-ray of bilateral ribs, cervical spine and lumbar spine were performed and all are negative except for degenerative changes. Patient be treated with rest and analgesia.  I personally performed the services described in this documentation, which was scribed in my presence. The recorded information has been reviewed and is accurate.       Gilda Crease, MD 01/24/13 [phone]

## 2013-01-24 NOTE — ED Notes (Signed)
Pt reports falling out of bed twice on Sat, and fell once this morning. Reports ribs hurt but no other pain or injury from fall.

## 2013-06-01 ENCOUNTER — Encounter (HOSPITAL_COMMUNITY): Payer: Self-pay | Admitting: Emergency Medicine

## 2013-06-01 ENCOUNTER — Emergency Department (HOSPITAL_COMMUNITY)
Admission: EM | Admit: 2013-06-01 | Discharge: 2013-06-01 | Disposition: A | Discharge status: Home / Self Care | Payer: Medicare HMO | Attending: Emergency Medicine | Admitting: Emergency Medicine

## 2013-06-01 DIAGNOSIS — R509 Fever, unspecified: Secondary | ICD-10-CM | POA: Insufficient documentation

## 2013-06-01 DIAGNOSIS — R519 Headache, unspecified: Secondary | ICD-10-CM

## 2013-06-01 DIAGNOSIS — F319 Bipolar disorder, unspecified: Secondary | ICD-10-CM | POA: Insufficient documentation

## 2013-06-01 DIAGNOSIS — Z794 Long term (current) use of insulin: Secondary | ICD-10-CM | POA: Insufficient documentation

## 2013-06-01 DIAGNOSIS — E119 Type 2 diabetes mellitus without complications: Secondary | ICD-10-CM | POA: Insufficient documentation

## 2013-06-01 DIAGNOSIS — R69 Illness, unspecified: Secondary | ICD-10-CM

## 2013-06-01 DIAGNOSIS — R51 Headache: Secondary | ICD-10-CM

## 2013-06-01 DIAGNOSIS — Z862 Personal history of diseases of the blood and blood-forming organs and certain disorders involving the immune mechanism: Secondary | ICD-10-CM | POA: Insufficient documentation

## 2013-06-01 DIAGNOSIS — I10 Essential (primary) hypertension: Secondary | ICD-10-CM | POA: Insufficient documentation

## 2013-06-01 DIAGNOSIS — IMO0001 Reserved for inherently not codable concepts without codable children: Secondary | ICD-10-CM | POA: Insufficient documentation

## 2013-06-01 DIAGNOSIS — Z79899 Other long term (current) drug therapy: Secondary | ICD-10-CM | POA: Insufficient documentation

## 2013-06-01 DIAGNOSIS — F411 Generalized anxiety disorder: Secondary | ICD-10-CM | POA: Insufficient documentation

## 2013-06-01 DIAGNOSIS — J111 Influenza due to unidentified influenza virus with other respiratory manifestations: Secondary | ICD-10-CM | POA: Insufficient documentation

## 2013-06-01 DIAGNOSIS — Z7982 Long term (current) use of aspirin: Secondary | ICD-10-CM | POA: Insufficient documentation

## 2013-06-01 MED ORDER — GUAIFENESIN-CODEINE 100-10 MG/5ML PO SYRP: 10.0000 mL | ORAL_SOLUTION | Freq: Three times a day (TID) | ORAL | Status: DC | PRN

## 2013-06-01 MED ORDER — METOCLOPRAMIDE HCL 5 MG/ML IJ SOLN
10.0000 mg | Freq: Once | INTRAMUSCULAR | Status: AC
  Administered 2013-06-01: 10 mg via INTRAMUSCULAR
  Filled 2013-06-01: qty 2

## 2013-06-01 MED ORDER — ONDANSETRON HCL 4 MG PO TABS: 4.0000 mg | ORAL_TABLET | Freq: Four times a day (QID) | ORAL | Status: DC

## 2013-06-01 MED ORDER — KETOROLAC TROMETHAMINE 60 MG/2ML IM SOLN
60.0000 mg | Freq: Once | INTRAMUSCULAR | Status: AC
  Administered 2013-06-01: 60 mg via INTRAMUSCULAR
  Filled 2013-06-01: qty 2

## 2013-06-01 MED ORDER — GUAIFENESIN-CODEINE 100-10 MG/5ML PO SOLN
10.0000 mL | Freq: Once | ORAL | Status: AC
  Administered 2013-06-01: 10 mL via ORAL
  Filled 2013-06-01: qty 10

## 2013-06-01 NOTE — ED Provider Notes (Signed)
CSN: [id number]     Arrival date & time 06/01/13  1048 History   First MD Initiated Contact with Patient 06/01/13 1158     Chief Complaint  Patient presents with  . Influenza   (Consider location/radiation/quality/duration/timing/severity/associated sxs/prior Treatment) Patient is a 43 y.o. female presenting with URI. The history is provided by the patient.  URI Presenting symptoms: congestion, cough, fever, rhinorrhea and sore throat   Presenting symptoms: no ear pain   Congestion:    Location:  Chest and nasal   Interferes with sleep: yes     Interferes with eating/drinking: no   Cough:    Cough characteristics:  Productive   Sputum characteristics:  Nondescript   Severity:  Moderate   Onset quality:  Gradual   Duration:  3 days   Timing:  Constant   Progression:  Unchanged   Chronicity:  New Fever:    Duration:  2 days   Max temp PTA (F):  "felt warm"   Temp source:  Subjective   Progression:  Unchanged Rhinorrhea:    Quality:  Clear   Severity:  Mild   Timing:  Constant   Progression:  Unchanged Severity:  Mild Onset quality:  Gradual Timing:  Constant Progression:  Unchanged Chronicity:  New Relieved by:  Nothing Worsened by:  Nothing tried Ineffective treatments:  None tried Associated symptoms: headaches, myalgias and sneezing   Associated symptoms: no arthralgias, no neck pain, no swollen glands and no wheezing   Risk factors: sick contacts   Risk factors: no chronic respiratory disease, no diabetes mellitus, no immunosuppression and no recent travel     Past Medical History  Diagnosis Date  . Diabetes mellitus   . Bipolar disorder   . Depression   . Anxiety   . Hypertension   . Anemia    Past Surgical History  Procedure Laterality Date  . Cesarean section    . Tubal ligation      X3  . Cholecystectomy  06/02/2012    Procedure: LAPAROSCOPIC CHOLECYSTECTOMY;  Surgeon: Jamesetta So, MD;  Location: [facility];  Service: General;  Laterality: N/A;   Attempted laparoscopic cholecystectomy  . Cholecystectomy  06/02/2012    Procedure: CHOLECYSTECTOMY;  Surgeon: Jamesetta So, MD;  Location: [facility];  Service: General;  Laterality: N/A;  converted to open at  0905  . Breast surgery     No family history on file. History  Substance Use Topics  . Smoking status: Never Smoker   . Smokeless tobacco: Not on file  . Alcohol Use: No   OB History   Grav Para Term Preterm Abortions TAB SAB Ect Mult Living                 Review of Systems  Constitutional: Positive for fever and chills.  HENT: Positive for congestion, rhinorrhea, sneezing and sore throat. Negative for ear pain, trouble swallowing and voice change.   Respiratory: Positive for cough. Negative for chest tightness, shortness of breath, wheezing and stridor.   Gastrointestinal: Negative for nausea, vomiting, abdominal pain and diarrhea.  Genitourinary: Negative for dysuria.  Musculoskeletal: Positive for myalgias. Negative for arthralgias and neck pain.  Skin: Negative for rash.  Neurological: Positive for headaches. Negative for dizziness, syncope, speech difficulty and weakness.  Hematological: Negative for adenopathy.  All other systems reviewed and are negative.    Allergies  Review of patient's allergies indicates no known allergies.  Home Medications   Current Outpatient Rx  Name  Route  Sig  Dispense  Refill  . exenatide (BYETTA 10 MCG PEN) 10 MCG/0.04ML SOLN   Subcutaneous   Inject 10 mcg into the skin 2 (two) times daily with a meal.          . gabapentin (NEURONTIN) 300 MG capsule   Oral   Take 300 mg by mouth 3 (three) times daily.         Marland Kitchen HUMULIN R 500 UNIT/ML SOLN injection   Subcutaneous   Inject 20 Units into the skin 3 (three) times daily.         Marland Kitchen levothyroxine (SYNTHROID, LEVOTHROID) 75 MCG tablet   Oral   Take 75 mcg by mouth daily.         . metFORMIN (GLUCOPHAGE) 1000 MG tablet   Oral   Take 1,000 mg by mouth 2 (two) times  daily.         Marland Kitchen thiothixene (NAVANE) 5 MG capsule   Oral   Take 5 mg by mouth daily.         Marland Kitchen albuterol (PROVENTIL HFA;VENTOLIN HFA) 108 (90 BASE) MCG/ACT inhaler   Inhalation   Inhale 2 puffs into the lungs every 6 (six) hours as needed for wheezing.         Marland Kitchen aspirin EC 81 MG tablet   Oral   Take 81 mg by mouth daily.         . Cholecalciferol (VITAMIN D) 400 UNITS capsule   Oral   Take 400 Units by mouth daily.           . cyclobenzaprine (FLEXERIL) 10 MG tablet   Oral   Take 1 tablet (10 mg total) by mouth 3 (three) times daily as needed for muscle spasms.   42 tablet   2   . DULoxetine (CYMBALTA) 60 MG capsule   Oral   Take 60 mg by mouth daily at 6 PM.          . ibuprofen (ADVIL,MOTRIN) 800 MG tablet   Oral   Take 1 tablet (800 mg total) by mouth every 8 (eight) hours as needed for pain.   90 tablet   5   . lisinopril (PRINIVIL,ZESTRIL) 2.5 MG tablet   Oral   Take 1 tablet (2.5 mg total) by mouth 2 (two) times daily.   60 tablet   0   . lithium carbonate 300 MG capsule   Oral   Take 300-600 mg by mouth 2 (two) times daily. Takes 600 mg in the morning and 300 mg at night         . OLANZapine (ZYPREXA) 20 MG tablet   Oral   Take 20 mg by mouth daily at 6 PM.          . POTASSIUM BICARBONATE PO   Oral   Take 1 tablet by mouth daily at 6 PM.          . simvastatin (ZOCOR) 20 MG tablet   Oral   Take 20 mg by mouth daily.           . traZODone (DESYREL) 100 MG tablet   Oral   Take 100 mg by mouth at bedtime.           BP 151/90  Pulse 77  Temp(Src) 98.2 F (36.8 C) (Oral)  Resp 20  Ht 5\' 2"  (1.575 m)  Wt 165 lb (74.844 kg)  BMI 30.17 kg/m2  SpO2 99%   Physical Exam  Nursing note and vitals reviewed. Constitutional: She is oriented  to person, place, and time. She appears well-developed and well-nourished. No distress.  HENT:  Head: Normocephalic and atraumatic.  Right Ear: Tympanic membrane and ear canal normal.   Left Ear: Tympanic membrane and ear canal normal.  Nose: Mucosal edema and rhinorrhea present.  Mouth/Throat: Uvula is midline and mucous membranes are normal. Posterior oropharyngeal erythema present. No oropharyngeal exudate or posterior oropharyngeal edema.  Eyes: EOM are normal. Pupils are equal, round, and reactive to light.  Neck: Normal range of motion, full passive range of motion without pain and phonation normal. Neck supple. No Brudzinski's sign and no Kernig's sign noted.  Cardiovascular: Normal rate, regular rhythm, normal heart sounds and intact distal pulses.   No murmur heard. Pulmonary/Chest: Effort normal. No stridor. No respiratory distress. She has no wheezes. She has no rales. She exhibits no tenderness.  Abdominal: Soft. She exhibits no distension and no mass. There is no tenderness. There is no rebound and no guarding.  Musculoskeletal: Normal range of motion. She exhibits no edema.  Lymphadenopathy:    She has no cervical adenopathy.  Neurological: She is alert and oriented to person, place, and time. She exhibits normal muscle tone. Coordination normal.  Skin: Skin is warm and dry.    ED Course  Procedures (including critical care time) Labs Review Labs Reviewed - No data to display Imaging Review No results found.  EKG Interpretation   None       MDM    Patient with flulike symptoms 3 days. Recent sick contacts with similar symptoms. Patient also complains of gradual onset of headache. No meningeal signs. Vital signs are stable. Patient agrees to symptomatic treatment and close followup with her primary care physician. She appears non-toxic and stable for discharge  Kadedra Vanaken L. Vanessa [place], PA-C 06/02/13 [id number]

## 2013-06-01 NOTE — ED Notes (Signed)
Pt reports flu like symptoms since Sunday.

## 2013-06-01 NOTE — ED Notes (Signed)
Pt c/o cough, generalized body aches, and fever since Sunday.

## 2013-06-03 NOTE — ED Provider Notes (Signed)
Medical screening examination/treatment/procedure(s) were performed by non-physician practitioner and as supervising physician I was immediately available for consultation/collaboration.  EKG Interpretation   None         Maudry Diego, MD 06/03/13 [phone]

## 2013-07-01 ENCOUNTER — Emergency Department (HOSPITAL_COMMUNITY)
Admission: EM | Admit: 2013-07-01 | Discharge: 2013-07-01 | Disposition: A | Discharge status: Home / Self Care | Payer: Medicare HMO | Attending: Emergency Medicine | Admitting: Emergency Medicine

## 2013-07-01 ENCOUNTER — Encounter (HOSPITAL_COMMUNITY): Payer: Self-pay | Admitting: Emergency Medicine

## 2013-07-01 ENCOUNTER — Emergency Department (HOSPITAL_COMMUNITY): Payer: Medicare HMO

## 2013-07-01 DIAGNOSIS — Y990 Civilian activity done for income or pay: Secondary | ICD-10-CM | POA: Insufficient documentation

## 2013-07-01 DIAGNOSIS — Z794 Long term (current) use of insulin: Secondary | ICD-10-CM | POA: Insufficient documentation

## 2013-07-01 DIAGNOSIS — F319 Bipolar disorder, unspecified: Secondary | ICD-10-CM | POA: Insufficient documentation

## 2013-07-01 DIAGNOSIS — Z862 Personal history of diseases of the blood and blood-forming organs and certain disorders involving the immune mechanism: Secondary | ICD-10-CM | POA: Insufficient documentation

## 2013-07-01 DIAGNOSIS — S63509A Unspecified sprain of unspecified wrist, initial encounter: Secondary | ICD-10-CM | POA: Insufficient documentation

## 2013-07-01 DIAGNOSIS — R296 Repeated falls: Secondary | ICD-10-CM | POA: Insufficient documentation

## 2013-07-01 DIAGNOSIS — F329 Major depressive disorder, single episode, unspecified: Secondary | ICD-10-CM | POA: Insufficient documentation

## 2013-07-01 DIAGNOSIS — F3289 Other specified depressive episodes: Secondary | ICD-10-CM | POA: Insufficient documentation

## 2013-07-01 DIAGNOSIS — M25531 Pain in right wrist: Secondary | ICD-10-CM

## 2013-07-01 DIAGNOSIS — I10 Essential (primary) hypertension: Secondary | ICD-10-CM | POA: Insufficient documentation

## 2013-07-01 DIAGNOSIS — Z7982 Long term (current) use of aspirin: Secondary | ICD-10-CM | POA: Insufficient documentation

## 2013-07-01 DIAGNOSIS — F411 Generalized anxiety disorder: Secondary | ICD-10-CM | POA: Insufficient documentation

## 2013-07-01 DIAGNOSIS — X500XXA Overexertion from strenuous movement or load, initial encounter: Secondary | ICD-10-CM | POA: Insufficient documentation

## 2013-07-01 DIAGNOSIS — E119 Type 2 diabetes mellitus without complications: Secondary | ICD-10-CM | POA: Insufficient documentation

## 2013-07-01 DIAGNOSIS — Y9302 Activity, running: Secondary | ICD-10-CM | POA: Insufficient documentation

## 2013-07-01 DIAGNOSIS — Y9289 Other specified places as the place of occurrence of the external cause: Secondary | ICD-10-CM | POA: Insufficient documentation

## 2013-07-01 LAB — GLUCOSE, CAPILLARY: Glucose-Capillary: 281 mg/dL — ABNORMAL HIGH (ref 70–99)

## 2013-07-01 MED ORDER — OXYCODONE-ACETAMINOPHEN 5-325 MG PO TABS
1.0000 | ORAL_TABLET | Freq: Once | ORAL | Status: AC
  Administered 2013-07-01: 1 via ORAL
  Filled 2013-07-01: qty 1

## 2013-07-01 MED ORDER — HYDROCODONE-ACETAMINOPHEN 5-325 MG PO TABS: ORAL_TABLET | ORAL | Status: DC

## 2013-07-01 NOTE — ED Provider Notes (Signed)
CSN: [id number]     Arrival date & time 07/01/13  1047 History   First MD Initiated Contact with Patient 07/01/13 1125     Chief Complaint  Patient presents with  . Fall   (Consider location/radiation/quality/duration/timing/severity/associated sxs/prior Treatment) Patient is a 43 y.o. female presenting with wrist pain. The history is provided by the patient.  Wrist Pain This is a new problem. Episode onset: occurred on the evening prior to ED arrival. The problem occurs constantly. The problem has been unchanged. Associated symptoms include arthralgias and joint swelling. Pertinent negatives include no abdominal pain, chest pain, chills, diaphoresis, fever, headaches, neck pain, numbness, vertigo, visual change, vomiting or weakness. The symptoms are aggravated by bending (movement and plaption). She has tried nothing for the symptoms. The treatment provided no relief.   Patient c/o pain and swelling to her right wrist after a fall at work.  States she fell on an outstretched hand trying to "get away from a bat".  She denies contact with the bat.   Past Medical History  Diagnosis Date  . Diabetes mellitus   . Bipolar disorder   . Depression   . Anxiety   . Hypertension   . Anemia    Past Surgical History  Procedure Laterality Date  . Cesarean section    . Tubal ligation      X3  . Cholecystectomy  06/02/2012    Procedure: LAPAROSCOPIC CHOLECYSTECTOMY;  Surgeon: Jamesetta So, MD;  Location: [facility];  Service: General;  Laterality: N/A;  Attempted laparoscopic cholecystectomy  . Cholecystectomy  06/02/2012    Procedure: CHOLECYSTECTOMY;  Surgeon: Jamesetta So, MD;  Location: [facility];  Service: General;  Laterality: N/A;  converted to open at  0905  . Breast surgery     Family History  Problem Relation Age of Onset  . Stroke Other   . Diabetes Other   . Cancer Other   . Seizures Other    History  Substance Use Topics  . Smoking status: Never Smoker   . Smokeless tobacco: Never  Used  . Alcohol Use: No   OB History   Grav Para Term Preterm Abortions TAB SAB Ect Mult Living   3 3 3       3      Review of Systems  Constitutional: Negative for fever, chills and diaphoresis.  Cardiovascular: Negative for chest pain.  Gastrointestinal: Negative for vomiting and abdominal pain.  Genitourinary: Negative for dysuria and difficulty urinating.  Musculoskeletal: Positive for arthralgias and joint swelling. Negative for neck pain.       Right wrist  Skin: Negative for color change and wound.  Neurological: Negative for vertigo, weakness, numbness and headaches.  All other systems reviewed and are negative.    Allergies  Review of patient's allergies indicates no known allergies.  Home Medications   Current Outpatient Rx  Name  Route  Sig  Dispense  Refill  . aspirin EC 81 MG tablet   Oral   Take 81 mg by mouth daily.         . Cholecalciferol 50000 UNITS TABS   Oral   Take 1 tablet by mouth every 7 (seven) days. Takes on Sundays         . exenatide (BYETTA 10 MCG PEN) 10 MCG/0.04ML SOLN   Subcutaneous   Inject 10 mcg into the skin 2 (two) times daily with a meal.          . gabapentin (NEURONTIN) 300 MG capsule  Oral   Take 300 mg by mouth 3 (three) times daily.         Marland Kitchen HUMULIN R 500 UNIT/ML SOLN injection   Subcutaneous   Inject 20 Units into the skin 3 (three) times daily.         Marland Kitchen ibuprofen (ADVIL,MOTRIN) 800 MG tablet   Oral   Take 1 tablet (800 mg total) by mouth every 8 (eight) hours as needed for pain.   90 tablet   5   . levothyroxine (SYNTHROID, LEVOTHROID) 75 MCG tablet   Oral   Take 75 mcg by mouth daily.         Marland Kitchen lisinopril (PRINIVIL,ZESTRIL) 2.5 MG tablet   Oral   Take 1 tablet (2.5 mg total) by mouth 2 (two) times daily.   60 tablet   0   . metFORMIN (GLUCOPHAGE) 1000 MG tablet   Oral   Take 1,000 mg by mouth 2 (two) times daily.         Marland Kitchen OLANZapine (ZYPREXA) 20 MG tablet   Oral   Take 20 mg by mouth  daily at 6 PM.          . ondansetron (ZOFRAN) 4 MG tablet   Oral   Take 1 tablet (4 mg total) by mouth every 6 (six) hours.   8 tablet   0   . POTASSIUM BICARBONATE PO   Oral   Take 1 tablet by mouth daily at 6 PM.          . simvastatin (ZOCOR) 20 MG tablet   Oral   Take 20 mg by mouth daily at 6 PM.          . thiothixene (NAVANE) 5 MG capsule   Oral   Take 5 mg by mouth at bedtime.          . traZODone (DESYREL) 100 MG tablet   Oral   Take 100 mg by mouth at bedtime.          Marland Kitchen albuterol (PROVENTIL HFA;VENTOLIN HFA) 108 (90 BASE) MCG/ACT inhaler   Inhalation   Inhale 2 puffs into the lungs every 6 (six) hours as needed for wheezing.         . cyclobenzaprine (FLEXERIL) 10 MG tablet   Oral   Take 1 tablet (10 mg total) by mouth 3 (three) times daily as needed for muscle spasms.   42 tablet   2   . DULoxetine (CYMBALTA) 60 MG capsule   Oral   Take 60 mg by mouth daily at 6 PM.          . lithium carbonate 300 MG capsule   Oral   Take 600 mg by mouth 2 (two) times daily. Takes 600 mg in the morning and 600 mg at night          BP 106/58  Pulse 78  Temp(Src) 98.5 F (36.9 C) (Oral)  Resp 18  Ht 5\' 2"  (1.575 m)  Wt 169 lb (76.658 kg)  BMI 30.90 kg/m2  SpO2 100%  LMP 01/23/2013 Physical Exam  Nursing note and vitals reviewed. Constitutional: She is oriented to person, place, and time. She appears well-developed and well-nourished. No distress.  HENT:  Head: Normocephalic and atraumatic.  Cardiovascular: Normal rate, regular rhythm and normal heart sounds.   Pulmonary/Chest: Effort normal and breath sounds normal.  Musculoskeletal: She exhibits edema and tenderness.  ttp and mild to moderate STS of the distal wrist.  Radial pulse is brisk, distal sensation intact.  CR< 2 sec.  No bruising or bony deformity.  Patient has reproducible pain with flexion and palpation over the scaphoid Compartments soft.  Neurological: She is alert and oriented  to person, place, and time. She exhibits normal muscle tone. Coordination normal.  Skin: Skin is warm and dry.    ED Course  Procedures (including critical care time) Labs Review Labs Reviewed  GLUCOSE, CAPILLARY - Abnormal; Notable for the following:    Glucose-Capillary 281 (*)    All other components within normal limits   Imaging Review Dg Wrist Complete Right  07/01/2013   CLINICAL DATA:  Post fall, palmar right hand and thumb pain, right wrist pain, fell on outstretched hand last night  EXAM: RIGHT WRIST - COMPLETE 3+ VIEW  COMPARISON:  None  FINDINGS: Osseous mineralization normal.  Joint spaces preserved.  Small bony density is identified adjacent to the radial aspect at the distal pole of the scaphoid, appears corticated/old without evidence of a donor site.  No definite acute fracture, dislocation or bone destruction.  Tiny non fused ossicle adjacent to tip of ulnar styloid.  IMPRESSION: Small angular bony density adjacent to distal pole of scaphoid, likely old ; correlation for pain/tenderness at the distal scaphoid recommended however to exclude atypical acute injury.  No additional bony abnormalities.   Electronically Signed   By: Lavonia Dana M.D.   On: 07/01/2013 12:00   Dg Hand Complete Right  07/01/2013   CLINICAL DATA:  Right-sided hand pain and thumb pain  EXAM: RIGHT HAND - COMPLETE 3+ VIEW  COMPARISON:  None.  FINDINGS: There is no evidence of fracture or dislocation. There is no evidence of arthropathy or other focal bone abnormality. Soft tissues are unremarkable.  IMPRESSION: Negative.   Electronically Signed   By: Kathreen Devoid   On: 07/01/2013 11:55    EKG Interpretation   None       MDM    Patient has known history of diabetes. She takes Glucophage twice daily and sliding scale insulin which he has not taken today. She denies any symptoms other than pain to her right hand and wrist. No clinical symptoms of DKA at this time. Vital signs are stable. She agrees to  take her insulin when she returns home. She appears stable for discharge. Referral information given for Dr. Luna Glasgow  12:48 PM consulted Dr. Luna Glasgow.  Will see pt in his office on Monday am.    velcro wrist splint applied, pain improved.  Remains NV intact.  VSS.  She appears stable for discharge.  Sonu Kruckenberg L. Vanessa [place], PA-C 07/02/13 2003

## 2013-07-01 NOTE — ED Notes (Addendum)
Fall last night at work when running from a bat.  Pain rt hand and wrist. Swelling present.  Ice pack applied.Good radial pulse.

## 2013-07-01 NOTE — ED Notes (Signed)
Patient reports falling last night at work. Denies hitting head or LOC. Patient states " I was trying to get away from a bat." Patient c/o right wrist and hand pain with swelling.

## 2013-07-01 NOTE — Discharge Instructions (Signed)
Wrist Pain Wrist injuries are frequent in adults and children. A sprain is an injury to the ligaments that hold your bones together. A strain is an injury to muscle or muscle cord-like structures (tendons) from stretching or pulling. Generally, when wrists are moderately tender to touch following a fall or injury, a break in the bone (fracture) may be present. Most wrist sprains or strains are better in 3 to 5 days, but complete healing may take several weeks. HOME CARE INSTRUCTIONS   Put ice on the injured area.  Put ice in a plastic bag.  Place a towel between your skin and the bag.  Leave the ice on for 15-20 minutes, 03-04 times a day, for the first 2 days.  Keep your arm raised above the level of your heart whenever possible to reduce swelling and pain.  Rest the injured area for at least 48 hours or as directed by your caregiver.  If a splint or elastic bandage has been applied, use it for as long as directed by your caregiver or until seen by a caregiver for a follow-up exam.  Only take over-the-counter or prescription medicines for pain, discomfort, or fever as directed by your caregiver.  Keep all follow-up appointments. You may need to follow up with a specialist or have follow-up X-rays. Improvement in pain level is not a guarantee that you did not fracture a bone in your wrist. The only way to determine whether or not you have a broken bone is by X-ray. SEEK IMMEDIATE MEDICAL CARE IF:   Your fingers are swollen, very red, white, or cold and blue.  Your fingers are numb or tingling.  You have increasing pain.  You have difficulty moving your fingers. MAKE SURE YOU:   Understand these instructions.  Will watch your condition.  Will get help right away if you are not doing well or get worse. Document Released: 02/19/2005 Document Revised: 08/04/2011 Document Reviewed: 07/03/2010 [employer] Patient Information 2014 [employer]  

## 2013-07-04 NOTE — ED Provider Notes (Signed)
Medical screening examination/treatment/procedure(s) were performed by non-physician practitioner and as supervising physician I was immediately available for consultation/collaboration.  EKG Interpretation   None         Sharyon Cable, MD 07/04/13 2213

## 2013-08-05 ENCOUNTER — Other Ambulatory Visit: Payer: Self-pay | Admitting: Obstetrics & Gynecology

## 2013-08-12 ENCOUNTER — Encounter: Payer: Self-pay | Admitting: *Deleted

## 2013-08-12 ENCOUNTER — Other Ambulatory Visit: Payer: Self-pay | Admitting: Obstetrics & Gynecology

## 2013-09-02 ENCOUNTER — Other Ambulatory Visit (INDEPENDENT_AMBULATORY_CARE_PROVIDER_SITE_OTHER): Payer: Medicare HMO | Admitting: Obstetrics & Gynecology

## 2013-09-02 ENCOUNTER — Ambulatory Visit (INDEPENDENT_AMBULATORY_CARE_PROVIDER_SITE_OTHER): Payer: Medicare HMO | Admitting: Obstetrics & Gynecology

## 2013-09-02 ENCOUNTER — Other Ambulatory Visit (HOSPITAL_COMMUNITY)
Admission: RE | Admit: 2013-09-02 | Discharge: 2013-09-02 | Disposition: A | Discharge status: Home / Self Care | Payer: Medicare HMO | Source: Ambulatory Visit | Attending: Obstetrics & Gynecology | Admitting: Obstetrics & Gynecology

## 2013-09-02 ENCOUNTER — Encounter: Payer: Self-pay | Admitting: Obstetrics & Gynecology

## 2013-09-02 VITALS — BP 110/80 | Ht 60.0 in | Wt 170.0 lb

## 2013-09-02 DIAGNOSIS — Z1151 Encounter for screening for human papillomavirus (HPV): Secondary | ICD-10-CM | POA: Insufficient documentation

## 2013-09-02 DIAGNOSIS — Z01419 Encounter for gynecological examination (general) (routine) without abnormal findings: Secondary | ICD-10-CM | POA: Insufficient documentation

## 2013-09-02 DIAGNOSIS — R110 Nausea: Secondary | ICD-10-CM

## 2013-09-02 DIAGNOSIS — Z3202 Encounter for pregnancy test, result negative: Secondary | ICD-10-CM

## 2013-09-02 DIAGNOSIS — Z124 Encounter for screening for malignant neoplasm of cervix: Secondary | ICD-10-CM

## 2013-09-02 LAB — POCT URINE PREGNANCY: Preg Test, Ur: NEGATIVE

## 2013-09-02 MED ORDER — PANTOPRAZOLE SODIUM 40 MG PO TBEC: 40.0000 mg | DELAYED_RELEASE_TABLET | Freq: Every day | ORAL | Status: DC

## 2013-09-02 NOTE — Progress Notes (Signed)
Patient ID: Kathryn [name], female   DOB: 1970/11/26, 43 y.o.   MRN: [id number] Patient's last menstrual period was 01/23/2013. uhcg negative Vulva is normal without lesions Vagina is pink moist without discharge Cervix normal in appearance and pap is normal Uterus is normal size shape contour Adnexa is negative with normal sized ovaries  Past Medical History  Diagnosis Date  . Diabetes mellitus   . Bipolar disorder   . Depression   . Anxiety   . Hypertension   . Anemia     Past Surgical History  Procedure Laterality Date  . Cesarean section    . Tubal ligation      X3  . Cholecystectomy  06/02/2012    Procedure: LAPAROSCOPIC CHOLECYSTECTOMY;  Surgeon: Jamesetta So, MD;  Location: [facility];  Service: General;  Laterality: N/A;  Attempted laparoscopic cholecystectomy  . Cholecystectomy  06/02/2012    Procedure: CHOLECYSTECTOMY;  Surgeon: Jamesetta So, MD;  Location: [facility];  Service: General;  Laterality: N/A;  converted to open at  0905  . Breast surgery      OB History   Grav Para Term Preterm Abortions TAB SAB Ect Mult Living   3 3 3       3       No Known Allergies  History   Social History  . Marital Status: Married    Spouse Name: N/A    Number of Children: N/A  . Years of Education: N/A   Social History Main Topics  . Smoking status: Never Smoker   . Smokeless tobacco: Never Used  . Alcohol Use: No  . Drug Use: No  . Sexual Activity: Yes    Birth Control/ Protection: Surgical   Other Topics Concern  . None   Social History Narrative  . None    Family History  Problem Relation Age of Onset  . Stroke Other   . Diabetes Other   . Cancer Other   . Seizures Other

## 2013-09-02 NOTE — Addendum Note (Signed)
Addended by: Florian Buff on: 09/02/2013 11:54 AM   Modules accepted: Orders

## 2013-09-05 ENCOUNTER — Other Ambulatory Visit: Payer: Self-pay | Admitting: Obstetrics & Gynecology

## 2013-09-05 DIAGNOSIS — Z1231 Encounter for screening mammogram for malignant neoplasm of breast: Secondary | ICD-10-CM

## 2013-09-06 ENCOUNTER — Ambulatory Visit (HOSPITAL_COMMUNITY)
Admission: RE | Admit: 2013-09-06 | Discharge: 2013-09-06 | Disposition: A | Discharge status: Home / Self Care | Payer: Medicare HMO | Source: Ambulatory Visit | Attending: Obstetrics & Gynecology | Admitting: Obstetrics & Gynecology

## 2013-09-06 DIAGNOSIS — Z1231 Encounter for screening mammogram for malignant neoplasm of breast: Secondary | ICD-10-CM

## 2014-02-03 ENCOUNTER — Ambulatory Visit: Payer: Medicare HMO | Admitting: Family Medicine

## 2014-02-23 ENCOUNTER — Emergency Department (HOSPITAL_COMMUNITY): Payer: Medicare HMO

## 2014-02-23 ENCOUNTER — Encounter (HOSPITAL_COMMUNITY): Payer: Self-pay | Admitting: Emergency Medicine

## 2014-02-23 ENCOUNTER — Emergency Department (HOSPITAL_COMMUNITY)
Admission: EM | Admit: 2014-02-23 | Discharge: 2014-02-23 | Disposition: A | Discharge status: Home / Self Care | Payer: Medicare HMO | Attending: Emergency Medicine | Admitting: Emergency Medicine

## 2014-02-23 DIAGNOSIS — F319 Bipolar disorder, unspecified: Secondary | ICD-10-CM | POA: Insufficient documentation

## 2014-02-23 DIAGNOSIS — Z7951 Long term (current) use of inhaled steroids: Secondary | ICD-10-CM | POA: Diagnosis not present

## 2014-02-23 DIAGNOSIS — S90112A Contusion of left great toe without damage to nail, initial encounter: Secondary | ICD-10-CM | POA: Diagnosis not present

## 2014-02-23 DIAGNOSIS — F419 Anxiety disorder, unspecified: Secondary | ICD-10-CM | POA: Diagnosis not present

## 2014-02-23 DIAGNOSIS — Z79899 Other long term (current) drug therapy: Secondary | ICD-10-CM | POA: Diagnosis not present

## 2014-02-23 DIAGNOSIS — Z794 Long term (current) use of insulin: Secondary | ICD-10-CM | POA: Insufficient documentation

## 2014-02-23 DIAGNOSIS — Y92099 Unspecified place in other non-institutional residence as the place of occurrence of the external cause: Secondary | ICD-10-CM | POA: Diagnosis not present

## 2014-02-23 DIAGNOSIS — E119 Type 2 diabetes mellitus without complications: Secondary | ICD-10-CM | POA: Insufficient documentation

## 2014-02-23 DIAGNOSIS — S99922A Unspecified injury of left foot, initial encounter: Secondary | ICD-10-CM | POA: Diagnosis present

## 2014-02-23 DIAGNOSIS — S90222A Contusion of left lesser toe(s) with damage to nail, initial encounter: Secondary | ICD-10-CM

## 2014-02-23 DIAGNOSIS — Y9389 Activity, other specified: Secondary | ICD-10-CM | POA: Diagnosis not present

## 2014-02-23 DIAGNOSIS — Z792 Long term (current) use of antibiotics: Secondary | ICD-10-CM | POA: Diagnosis not present

## 2014-02-23 DIAGNOSIS — I10 Essential (primary) hypertension: Secondary | ICD-10-CM | POA: Diagnosis not present

## 2014-02-23 DIAGNOSIS — S90122A Contusion of left lesser toe(s) without damage to nail, initial encounter: Secondary | ICD-10-CM

## 2014-02-23 DIAGNOSIS — Z7982 Long term (current) use of aspirin: Secondary | ICD-10-CM | POA: Insufficient documentation

## 2014-02-23 DIAGNOSIS — W208XXA Other cause of strike by thrown, projected or falling object, initial encounter: Secondary | ICD-10-CM | POA: Insufficient documentation

## 2014-02-23 DIAGNOSIS — Z862 Personal history of diseases of the blood and blood-forming organs and certain disorders involving the immune mechanism: Secondary | ICD-10-CM | POA: Diagnosis not present

## 2014-02-23 LAB — CBG MONITORING, ED: Glucose-Capillary: 279 mg/dL — ABNORMAL HIGH (ref 70–99)

## 2014-02-23 MED ORDER — HYDROCODONE-ACETAMINOPHEN 5-325 MG PO TABS: 1.0000 | ORAL_TABLET | Freq: Four times a day (QID) | ORAL | Status: DC | PRN

## 2014-02-23 MED ORDER — CEPHALEXIN 500 MG PO CAPS: 500.0000 mg | ORAL_CAPSULE | Freq: Three times a day (TID) | ORAL | Status: DC

## 2014-02-23 MED ORDER — HYDROCODONE-ACETAMINOPHEN 5-325 MG PO TABS
1.0000 | ORAL_TABLET | Freq: Once | ORAL | Status: AC
  Administered 2014-02-23: 1 via ORAL
  Filled 2014-02-23: qty 1

## 2014-02-23 NOTE — ED Notes (Signed)
Lt great toe swollen and painful ?subungal hematoma

## 2014-02-23 NOTE — ED Provider Notes (Signed)
CSN: [id number]     Arrival date & time 02/23/14  1454 History  This chart was scribed for Janice Norrie, MD by Edison Simon, ED Scribe. This patient was seen in room APFT24/APFT24 and the patient's care was started at 3:19 PM.    Chief Complaint  Patient presents with  . Toe Injury   The history is provided by the patient. No language interpreter was used.    HPI Comments: Kathryn [name] is a 43 y.o. female who presents to the Emergency Department complaining of toe injury sustained while fixing her storm door when the window fell out of the door and struck her left great toe 2 days ago. She denies bleeding. She states the toe has been continuously painful since then. She states she has been limping because of the pain. She has used ice packs without relief. She describes the pain as throbbing and it is getting more painful.   She states her PCP is Dr. Maudie Mercury.  Past Medical History  Diagnosis Date  . Diabetes mellitus   . Bipolar disorder   . Depression   . Anxiety   . Hypertension   . Anemia    Past Surgical History  Procedure Laterality Date  . Cesarean section    . Tubal ligation      X3  . Cholecystectomy  06/02/2012    Procedure: LAPAROSCOPIC CHOLECYSTECTOMY;  Surgeon: Jamesetta So, MD;  Location: [facility];  Service: General;  Laterality: N/A;  Attempted laparoscopic cholecystectomy  . Cholecystectomy  06/02/2012    Procedure: CHOLECYSTECTOMY;  Surgeon: Jamesetta So, MD;  Location: [facility];  Service: General;  Laterality: N/A;  converted to open at  0905  . Breast surgery     Family History  Problem Relation Age of Onset  . Stroke Other   . Diabetes Other   . Cancer Other   . Seizures Other    History  Substance Use Topics  . Smoking status: Never Smoker   . Smokeless tobacco: Never Used  . Alcohol Use: No    She denies smoking or drinking.  She states she is on disability for schizoaffective disorder and does not work.  OB History   Grav Para Term Preterm Abortions  TAB SAB Ect Mult Living   3 3 3       3      Review of Systems  Musculoskeletal:       Pain to left great toe, walking with a limp  Hematological:       Denies bleeding    Allergies  Review of patient's allergies indicates no known allergies.  Home Medications   Prior to Admission medications   Medication Sig Start Date End Date Taking? Authorizing Provider  aspirin EC 81 MG tablet Take 81 mg by mouth daily.   Yes Historical Provider, MD  benztropine (COGENTIN) 1 MG tablet Take 0.5-1 mg by mouth 2 (two) times daily. Takes one-half tablet in the morning, may take up to one whole tablet at bedtime 02/16/14  Yes Historical Provider, MD  Cholecalciferol 50000 UNITS TABS Take 1 tablet by mouth every 7 (seven) days. Takes on Sundays   Yes Historical Provider, MD  DULoxetine (CYMBALTA) 60 MG capsule Take 60 mg by mouth daily at 6 PM.    Yes Historical Provider, MD  exenatide (BYETTA 10 MCG PEN) 10 MCG/0.04ML SOLN Inject 10 mcg into the skin 2 (two) times daily with a meal.    Yes Historical Provider, MD  HUMULIN R  500 UNIT/ML SOLN injection Inject 20 Units into the skin 3 (three) times daily. 04/20/13  Yes Historical Provider, MD  lithium carbonate 300 MG capsule Take 600 mg by mouth 2 (two) times daily. Takes 600 mg in the morning and 600 mg at night   Yes Historical Provider, MD  metFORMIN (GLUCOPHAGE) 1000 MG tablet Take 1,000 mg by mouth 2 (two) times daily. 04/20/13  Yes Historical Provider, MD  thiothixene (NAVANE) 5 MG capsule Take 5 mg by mouth at bedtime.  04/27/13  Yes Historical Provider, MD  traZODone (DESYREL) 100 MG tablet Take 150 mg by mouth at bedtime.    Yes Historical Provider, MD  albuterol (PROVENTIL HFA;VENTOLIN HFA) 108 (90 BASE) MCG/ACT inhaler Inhale 2 puffs into the lungs every 6 (six) hours as needed for wheezing.    Historical Provider, MD  cephALEXin (KEFLEX) 500 MG capsule Take 1 capsule (500 mg total) by mouth 3 (three) times daily. 02/23/14   Janice Norrie, MD   HYDROcodone-acetaminophen (NORCO/VICODIN) 5-325 MG per tablet Take 1-2 tablets by mouth every 6 (six) hours as needed for moderate pain. 02/23/14   Janice Norrie, MD  simvastatin (ZOCOR) 20 MG tablet Take 20 mg by mouth daily at 6 PM.     Historical Provider, MD   BP 141/95  Pulse 88  Temp(Src) 98.3 F (36.8 C) (Oral)  Resp 16  Ht 5\' 2"  (1.575 m)  Wt 154 lb (69.854 kg)  BMI 28.16 kg/m2  SpO2 100%  LMP 01/23/2013  Vital signs normal   Physical Exam  Nursing note and vitals reviewed. Constitutional: She is oriented to person, place, and time. She appears well-developed and well-nourished.  Non-toxic appearance. She does not appear ill. No distress.  HENT:  Head: Normocephalic and atraumatic.  Right Ear: External ear normal.  Left Ear: External ear normal.  Nose: Nose normal. No mucosal edema or rhinorrhea.  Mouth/Throat: Oropharynx is clear and moist and mucous membranes are normal. No dental abscesses or uvula swelling.  Eyes: Conjunctivae and EOM are normal. Pupils are equal, round, and reactive to light.  Neck: Normal range of motion and full passive range of motion without pain. Neck supple.  Pulmonary/Chest: Effort normal. She has no rhonchi. She exhibits no crepitus.  Abdominal: Normal appearance.  Musculoskeletal: Normal range of motion. She exhibits no edema.  Moves all extremities well. diffuse swelling of left great toe with bruising on volar aspect, toenail is thickened but no obvious subungual hematoma, no laceration or abrasion  Neurological: She is alert and oriented to person, place, and time. She has normal strength. No cranial nerve deficit.  Skin: Skin is warm, dry and intact. No rash noted. No erythema. No pallor.  Psychiatric: She has a normal mood and affect. Her speech is normal and behavior is normal. Her mood appears not anxious.       ED Course  Procedures (including critical care time) Labs Review Labs Reviewed  CBG MONITORING, ED - Abnormal; Notable  for the following:    Glucose-Capillary 279 (*)    All other components within normal limits   The toe nail was cleaned and 2 trephination holes were placed for release of moderate amount of blood. Patient was placed in a postop shoe. She was able to walk well. Crutches were not given because of walking well. We discussed following up with a podiatrist or an orthopedist.   Laboratory interpretation all normal except hyperglycemia (her typical result)   Imaging Review Dg Toe Great Left  02/23/2014  CLINICAL DATA:  Acute left great toe pain, injury, dropped glass from a door on her toe.  EXAM: LEFT GREAT TOE  COMPARISON:  None.  FINDINGS: Normal alignment without acute fracture. Preserved joint spaces. Minor bony spurring of the left great toe interphalangeal joint. No definite radiopaque foreign body.  IMPRESSION: Minor degenerative change.  No acute osseous finding.   Electronically Signed   By: Daryll Brod M.D.   On: 02/23/2014 15:35     EKG Interpretation None     DIAGNOSTIC STUDIES: Oxygen Saturation is 100% on room air, normal by my interpretation.    COORDINATION OF CARE: 3:22 PM Discussed treatment plan including x-ray with patient at beside, the patient agrees with the plan and has no further questions at this time.    MDM   Final diagnoses:  Contusion, toe, left, initial encounter  Subungual hematoma of toe of left foot, initial encounter    Discharge Medication List as of 02/23/2014  4:17 PM    START taking these medications   Details  cephALEXin (KEFLEX) 500 MG capsule Take 1 capsule (500 mg total) by mouth 3 (three) times daily., Starting 02/23/2014, Until Discontinued, Print    HYDROcodone-acetaminophen (NORCO/VICODIN) 5-325 MG per tablet Take 1-2 tablets by mouth every 6 (six) hours as needed for moderate pain., Starting 02/23/2014, Until Discontinued, Print        Plan discharge  Rolland Porter, MD, FACEP   I personally performed the services described in  this documentation, which was scribed in my presence. The recorded information has been reviewed and considered.  Rolland Porter, MD, Abram Sander   Janice Norrie, MD 02/23/14 [phone]

## 2014-02-23 NOTE — ED Notes (Signed)
Pt reports screen door dropped on her left big toe. Pain at site. Happened 2 days ago, but pain worsening.

## 2014-02-23 NOTE — Discharge Instructions (Signed)
Elevate your foot. Use the ice packs for pain. Take the hydrocodone for pain and the antibiotic keflex. Use the post-op shoe until you are able to walk without it. Recheck by Dr Caprice Beaver the podiatrist in [place] or Dr Luna Glasgow, the orthopedist on call today.  Recheck if you get a fever, the toe drains pus, you get a red streak going up your foot or leg, or you get more redness and swelling of your foot or lower leg.    Contusion A contusion is a deep bruise. Contusions are the result of an injury that caused bleeding under the skin. The contusion may turn blue, purple, or yellow. Minor injuries will give you a painless contusion, but more severe contusions may stay painful and swollen for a few weeks.  CAUSES  A contusion is usually caused by a blow, trauma, or direct force to an area of the body. SYMPTOMS   Swelling and redness of the injured area.  Bruising of the injured area.  Tenderness and soreness of the injured area.  Pain. DIAGNOSIS  The diagnosis can be made by taking a history and physical exam. An X-ray, CT scan, or MRI may be needed to determine if there were any associated injuries, such as fractures. TREATMENT  Specific treatment will depend on what area of the body was injured. In general, the best treatment for a contusion is resting, icing, elevating, and applying cold compresses to the injured area. Over-the-counter medicines may also be recommended for pain control. Ask your caregiver what the best treatment is for your contusion. HOME CARE INSTRUCTIONS   Put ice on the injured area.  Put ice in a plastic bag.  Place a towel between your skin and the bag.  Leave the ice on for 15-20 minutes, 3-4 times a day, or as directed by your health care provider.  Only take over-the-counter or prescription medicines for pain, discomfort, or fever as directed by your caregiver. Your caregiver may recommend avoiding anti-inflammatory medicines (aspirin, ibuprofen, and  naproxen) for 48 hours because these medicines may increase bruising.  Rest the injured area.  If possible, elevate the injured area to reduce swelling. SEEK IMMEDIATE MEDICAL CARE IF:   You have increased bruising or swelling.  You have pain that is getting worse.  Your swelling or pain is not relieved with medicines. MAKE SURE YOU:   Understand these instructions.  Will watch your condition.  Will get help right away if you are not doing well or get worse. Document Released: 02/19/2005 Document Revised: 05/17/2013 Document Reviewed: 03/17/2011 [facility] Patient Information 2015 [place], [place]. This information is not intended to replace advice given to you by your health care provider. Make sure you discuss any questions you have with your health care provider.  Subungual Hematoma A subungual hematoma is a pocket of blood that collects under the fingernail or toenail. The pressure created by the blood under the nail can cause pain. CAUSES  A subungual hematoma occurs when an injury to the finger or toe causes a blood vessel beneath the nail to break. The injury can occur from a direct blow such as slamming a finger in a door. It can also occur from a repeated injury such as pressure on the foot in a shoe while running. A subungual hematoma is sometimes called runner's toe or tennis toe. SYMPTOMS   Blue or dark blue skin under the nail.  Pain or throbbing in the injured area. DIAGNOSIS  Your caregiver can determine whether you have a  subungual hematoma based on your history and a physical exam. If your caregiver thinks you might have a broken (fractured) bone, X-rays may be taken. TREATMENT  Hematomas usually go away on their own over time. Your caregiver may make a hole in the nail to drain the blood. Draining the blood is painless and usually provides significant relief from pain and throbbing. The nail usually grows back normally after this procedure. In some cases, the nail may  need to be removed. This is done if there is a cut under the nail that requires stitches (sutures). HOME CARE INSTRUCTIONS   Put ice on the injured area.  Put ice in a plastic bag.  Place a towel between your skin and the bag.  Leave the ice on for 15-20 minutes, 03-04 times a day for the first 1 to 2 days.  Elevate the injured area to help decrease pain and swelling.  If you were given a bandage, wear it for as long as directed by your caregiver.  If part of your nail falls off, trim the remaining nail gently. This prevents the nail from catching on something and causing further injury.  Only take over-the-counter or prescription medicines for pain, discomfort, or fever as directed by your caregiver. SEEK IMMEDIATE MEDICAL CARE IF:   You have redness or swelling around the nail.  You have yellowish-white fluid (pus) coming from the nail.  Your pain is not controlled with medicine.  You have a fever. MAKE SURE YOU:  Understand these instructions.  Will watch your condition.  Will get help right away if you are not doing well or get worse. Document Released: 05/09/2000 Document Revised: 08/04/2011 Document Reviewed: 04/30/2011 [facility] Patient Information 2015 [place], [place]. This information is not intended to replace advice given to you by your health care provider. Make sure you discuss any questions you have with your health care provider.

## 2014-03-08 ENCOUNTER — Emergency Department (HOSPITAL_COMMUNITY)
Admission: EM | Admit: 2014-03-08 | Discharge: 2014-03-08 | Disposition: A | Discharge status: Home / Self Care | Payer: Medicare HMO | Attending: Emergency Medicine | Admitting: Emergency Medicine

## 2014-03-08 ENCOUNTER — Encounter (HOSPITAL_COMMUNITY): Payer: Self-pay | Admitting: Emergency Medicine

## 2014-03-08 DIAGNOSIS — Z9889 Other specified postprocedural states: Secondary | ICD-10-CM | POA: Diagnosis not present

## 2014-03-08 DIAGNOSIS — E119 Type 2 diabetes mellitus without complications: Secondary | ICD-10-CM | POA: Insufficient documentation

## 2014-03-08 DIAGNOSIS — F319 Bipolar disorder, unspecified: Secondary | ICD-10-CM | POA: Diagnosis not present

## 2014-03-08 DIAGNOSIS — F419 Anxiety disorder, unspecified: Secondary | ICD-10-CM | POA: Insufficient documentation

## 2014-03-08 DIAGNOSIS — Z9851 Tubal ligation status: Secondary | ICD-10-CM | POA: Diagnosis not present

## 2014-03-08 DIAGNOSIS — L723 Sebaceous cyst: Secondary | ICD-10-CM | POA: Diagnosis not present

## 2014-03-08 DIAGNOSIS — Z862 Personal history of diseases of the blood and blood-forming organs and certain disorders involving the immune mechanism: Secondary | ICD-10-CM | POA: Insufficient documentation

## 2014-03-08 DIAGNOSIS — Z79899 Other long term (current) drug therapy: Secondary | ICD-10-CM | POA: Insufficient documentation

## 2014-03-08 DIAGNOSIS — I10 Essential (primary) hypertension: Secondary | ICD-10-CM | POA: Diagnosis not present

## 2014-03-08 DIAGNOSIS — Z7982 Long term (current) use of aspirin: Secondary | ICD-10-CM | POA: Insufficient documentation

## 2014-03-08 DIAGNOSIS — N9089 Other specified noninflammatory disorders of vulva and perineum: Secondary | ICD-10-CM | POA: Diagnosis present

## 2014-03-08 MED ORDER — LIDOCAINE HCL (PF) 2 % IJ SOLN
INTRAMUSCULAR | Status: AC
  Administered 2014-03-08: 10:00:00
  Filled 2014-03-08: qty 10

## 2014-03-08 MED ORDER — POVIDONE-IODINE 10 % EX SOLN
CUTANEOUS | Status: AC
  Administered 2014-03-08: 10:00:00
  Filled 2014-03-08: qty 118

## 2014-03-08 NOTE — Discharge Instructions (Signed)
Epidermal Cyst An epidermal cyst is usually a small, painless lump under the skin. Cysts often occur on the face, neck, stomach, chest, or genitals. The cyst may be filled with a bad smelling paste. Do not pop your cyst. Popping the cyst can cause pain and puffiness (swelling). HOME CARE   Only take medicines as told by your doctor.  Take your medicine (antibiotics) as told. Finish it even if you start to feel better. GET HELP RIGHT AWAY IF:  Your cyst is tender, red, or puffy.  You are not getting better, or you are getting worse.  You have any questions or concerns. MAKE SURE YOU:  Understand these instructions.  Will watch your condition.  Will get help right away if you are not doing well or get worse. Document Released: 06/19/2004 Document Revised: 11/11/2011 Document Reviewed: 11/18/2010 [employer] Patient Information 2015 [employer]. This information is not intended to replace advice given to you by your health care provider. Make sure you discuss any questions you have with your health care provider.  

## 2014-03-08 NOTE — ED Notes (Signed)
Pt c/o labial edema since Sunday. States she has had to use an ice pack. Pt reports cyst on R labial area.

## 2014-03-08 NOTE — ED Provider Notes (Signed)
CSN: [id number]     Arrival date & time 03/08/14  9509 History   First MD Initiated Contact with Patient 03/08/14 [phone]     Chief Complaint  Patient presents with  . Cyst     (Consider location/radiation/quality/duration/timing/severity/associated sxs/prior Treatment) The history is provided by the patient.   Kathryn [name] is a 43 y.o. female presenting with a four-day history of pain and swelling in her right labia.  She describes having a small "bump" at this site which has been present for quite some time but has enlarged and become more tender since Sunday.  There is no drainage from the site and no radiation of pain which is worse with palpation and walking.  She denies any other complaints today.  She has had no treatments for this condition prior to arrival.     Past Medical History  Diagnosis Date  . Diabetes mellitus   . Bipolar disorder   . Depression   . Anxiety   . Hypertension   . Anemia    Past Surgical History  Procedure Laterality Date  . Cesarean section    . Tubal ligation      X3  . Cholecystectomy  06/02/2012    Procedure: LAPAROSCOPIC CHOLECYSTECTOMY;  Surgeon: Jamesetta So, MD;  Location: [facility];  Service: General;  Laterality: N/A;  Attempted laparoscopic cholecystectomy  . Cholecystectomy  06/02/2012    Procedure: CHOLECYSTECTOMY;  Surgeon: Jamesetta So, MD;  Location: [facility];  Service: General;  Laterality: N/A;  converted to open at  0905  . Breast surgery     Family History  Problem Relation Age of Onset  . Stroke Other   . Diabetes Other   . Cancer Other   . Seizures Other   . Hypertension Mother   . Hypertension Father    History  Substance Use Topics  . Smoking status: Never Smoker   . Smokeless tobacco: Never Used  . Alcohol Use: No   OB History   Grav Para Term Preterm Abortions TAB SAB Ect Mult Living   3 3 3       3      Review of Systems  Constitutional: Negative for fever and chills.  Respiratory: Negative for shortness of  breath and wheezing.   Skin:       Negative except as mentioned in history of present illness  Neurological: Negative for numbness.      Allergies  Review of patient's allergies indicates no known allergies.  Home Medications   Prior to Admission medications   Medication Sig Start Date End Date Taking? Authorizing Provider  albuterol (PROVENTIL HFA;VENTOLIN HFA) 108 (90 BASE) MCG/ACT inhaler Inhale 2 puffs into the lungs every 6 (six) hours as needed for wheezing.   Yes Historical Provider, MD  aspirin EC 81 MG tablet Take 81 mg by mouth daily.   Yes Historical Provider, MD  benztropine (COGENTIN) 1 MG tablet Take 0.5-1 mg by mouth 2 (two) times daily. Takes one-half tablet in the morning, may take up to one whole tablet at bedtime 02/16/14  Yes Historical Provider, MD  Cholecalciferol 50000 UNITS TABS Take 1 tablet by mouth every 7 (seven) days. Takes on Sundays   Yes Historical Provider, MD  DULoxetine (CYMBALTA) 60 MG capsule Take 60 mg by mouth daily at 6 PM.    Yes Historical Provider, MD  exenatide (BYETTA 10 MCG PEN) 10 MCG/0.04ML SOLN Inject 10 mcg into the skin 2 (two) times daily with a meal.  Yes Historical Provider, MD  HUMULIN R 500 UNIT/ML SOLN injection Inject 20 Units into the skin 3 (three) times daily. 04/20/13  Yes Historical Provider, MD  HYDROcodone-acetaminophen (NORCO/VICODIN) 5-325 MG per tablet Take 1-2 tablets by mouth every 6 (six) hours as needed for moderate pain. 02/23/14  Yes Janice Norrie, MD  lithium carbonate 300 MG capsule Take 600 mg by mouth 2 (two) times daily. Takes 600 mg in the morning and 600 mg at night   Yes Historical Provider, MD  metFORMIN (GLUCOPHAGE) 1000 MG tablet Take 1,000 mg by mouth 2 (two) times daily. 04/20/13  Yes Historical Provider, MD  simvastatin (ZOCOR) 20 MG tablet Take 20 mg by mouth daily at 6 PM.    Yes Historical Provider, MD  thiothixene (NAVANE) 5 MG capsule Take 5 mg by mouth at bedtime.  04/27/13  Yes Historical Provider,  MD  traZODone (DESYREL) 100 MG tablet Take 150 mg by mouth at bedtime.    Yes Historical Provider, MD   BP 144/100  Pulse 73  Temp(Src) 97.9 F (36.6 C) (Oral)  Resp 18  Ht 5\' 2"  (1.575 m)  Wt 144 lb (65.318 kg)  BMI 26.33 kg/m2  SpO2 100%  LMP 01/23/2013 Physical Exam  Constitutional: She appears well-developed and well-nourished. No distress.  HENT:  Head: Normocephalic.  Neck: Neck supple.  Cardiovascular: Normal rate.   Pulmonary/Chest: Effort normal. She has no wheezes.  Musculoskeletal: Normal range of motion. She exhibits no edema.  Skin: No erythema.  Cutaneous raised nodule approximately 1 cm in diameter at right posterior labia.  There is no drainage.  The surface of this nodule has a slight white appearance suggesting sebaceous cyst.  There is no surrounding erythema.    ED Course  Procedures (including critical care time)  INCISION AND DRAINAGE Performed by: Evalee Jefferson Consent: Verbal consent obtained. Risks and benefits: risks, benefits and alternatives were discussed Type: abscess  Body area: Right labia  Anesthesia: local infiltration  Incision was made with a scalpel.  Local anesthetic: lidocaine 2 % without epinephrine  Anesthetic total: 1 ml  Complexity: complex Blunt dissection to break up loculations  Drainage: Grape sized pearly core containing cecum material   Drainage amount: Moderate   Packing material: None   Patient tolerance: Patient tolerated the procedure well with no immediate complications.    Labs Review Labs Reviewed - No data to display  Imaging Review No results found.   EKG Interpretation None      MDM   Final diagnoses:  Sebaceous cyst    Advised warm soaks for several days.  There is no evidence of infection in the sebaceous cyst.  Anticipate when necessary followup for this condition.    Evalee Jefferson, PA-C 03/08/14 1050

## 2014-03-09 NOTE — ED Provider Notes (Signed)
Medical screening examination/treatment/procedure(s) were performed by non-physician practitioner and as supervising physician I was immediately available for consultation/collaboration.   EKG Interpretation None        Francine Graven, DO 03/09/14 [phone]

## 2014-03-27 ENCOUNTER — Encounter (HOSPITAL_COMMUNITY): Payer: Self-pay | Admitting: Emergency Medicine

## 2014-04-06 ENCOUNTER — Emergency Department (HOSPITAL_COMMUNITY): Payer: Medicare HMO

## 2014-04-06 ENCOUNTER — Encounter (HOSPITAL_COMMUNITY): Payer: Self-pay | Admitting: *Deleted

## 2014-04-06 ENCOUNTER — Emergency Department (HOSPITAL_COMMUNITY)
Admission: EM | Admit: 2014-04-06 | Discharge: 2014-04-06 | Disposition: A | Discharge status: Home / Self Care | Payer: Medicare HMO | Attending: Emergency Medicine | Admitting: Emergency Medicine

## 2014-04-06 DIAGNOSIS — F419 Anxiety disorder, unspecified: Secondary | ICD-10-CM | POA: Diagnosis not present

## 2014-04-06 DIAGNOSIS — M7502 Adhesive capsulitis of left shoulder: Secondary | ICD-10-CM | POA: Diagnosis not present

## 2014-04-06 DIAGNOSIS — F319 Bipolar disorder, unspecified: Secondary | ICD-10-CM | POA: Insufficient documentation

## 2014-04-06 DIAGNOSIS — Z79899 Other long term (current) drug therapy: Secondary | ICD-10-CM | POA: Insufficient documentation

## 2014-04-06 DIAGNOSIS — Z862 Personal history of diseases of the blood and blood-forming organs and certain disorders involving the immune mechanism: Secondary | ICD-10-CM | POA: Insufficient documentation

## 2014-04-06 DIAGNOSIS — E119 Type 2 diabetes mellitus without complications: Secondary | ICD-10-CM | POA: Insufficient documentation

## 2014-04-06 DIAGNOSIS — M25512 Pain in left shoulder: Secondary | ICD-10-CM | POA: Diagnosis present

## 2014-04-06 DIAGNOSIS — Z7982 Long term (current) use of aspirin: Secondary | ICD-10-CM | POA: Insufficient documentation

## 2014-04-06 DIAGNOSIS — R52 Pain, unspecified: Secondary | ICD-10-CM

## 2014-04-06 DIAGNOSIS — I10 Essential (primary) hypertension: Secondary | ICD-10-CM | POA: Diagnosis not present

## 2014-04-06 DIAGNOSIS — Z794 Long term (current) use of insulin: Secondary | ICD-10-CM | POA: Diagnosis not present

## 2014-04-06 MED ORDER — HYDROCODONE-ACETAMINOPHEN 5-325 MG PO TABS: 1.0000 | ORAL_TABLET | ORAL | Status: DC | PRN

## 2014-04-06 NOTE — Discharge Instructions (Signed)
Shoulder Range of Motion Exercises  The shoulder is the most flexible joint in the human body. Because of this it is also the most unstable joint in the body. All ages can develop shoulder problems. Early treatment of problems is necessary for a good outcome. People react to shoulder pain by decreasing the movement of the joint. After a brief period of time, the shoulder can become "frozen". This is an almost complete loss of the ability to move the damaged shoulder. Following injuries your caregivers can give you instructions on exercises to keep your range of motion (ability to move your shoulder freely), or regain it if it has been lost.   EXERCISES  EXERCISES TO MAINTAIN THE MOBILITY OF YOUR SHOULDER:  Codman's Exercise or Pendulum Exercise  · This exercise may be performed in a prone (face-down) lying position or standing while leaning on a chair with the opposite arm. Its purpose is to relax the muscles in your shoulder and slowly but surely increase the range of motion and to relieve pain.  ¨ Lie on your stomach close to the side edge of the bed. Let your weak arm hang over the edge of the bed. Relax your shoulder, arm and hand. Let your shoulder blade relax and drop down.  ¨ Slowly and gently swing your arm forward and back. Do not use your neck muscles; relax them. It might be easier to have someone else gently start swinging your arm.  ¨ As pain decreases, increase your swing. To start, arm swing should begin at 15 degree angles. In time and as pain lessens, move to 30-45 degree angles. Start with swinging for about 15 seconds, and work towards swinging for 3 to 5 minutes.  · This exercise may also be performed in a standing/bent over position.  ¨ Stand and hold onto a sturdy chair with your good arm. Bend forward at the waist and bend your knees slightly to help protect your back. Relax your weak arm, let it hang limp. Relax your shoulder blade and let it drop.  ¨ Keep your shoulder relaxed and use body  motion to swing your arm in small circles.  ¨ Stand up tall and relax.  ¨ Repeat motion and change direction of circles.  ¨ Start with swinging for about 30 seconds, and work towards swinging for 3 to 5 minutes.  STRETCHING EXERCISES:  · Lift your arm out in front of you with the elbow bent at 90 degrees. Using your other arm gently pull the elbow forward and across your body.  · Bend one arm behind you with the palm facing outward. Using the other arm, hold a towel or rope and reach this arm up above your head, then bend it at the elbow to move your wrist to behind your neck. Grab the free end of the towel with the hand behind your back. Gently pull the towel up with the hand behind your neck, gradually increasing the pull on the hand behind the small of your back. Then, gradually pull down with the hand behind the small of your back. This will pull the hand and arm behind your neck further. Both shoulders will have an increased range of motion with repetition of this exercise.  STRENGTHENING EXERCISES:  · Standing with your arm at your side and straight out from your shoulder with the elbow bent at 90 degrees, hold onto a small weight and slowly raise your hand so it points straight up in the air. Repeat this five   times to begin with, and gradually increase to ten times. Do this four times per day. As you grow stronger you can gradually increase the weight.  · Repeat the above exercise, only this time using an elastic band. Start with your hand up in the air and pull down until your hand is by your side. As you grow stronger, gradually increase the amount you pull by increasing the number or size of the elastic bands. Use the same amount of repetitions.  · Standing with your hand at your side and holding onto a weight, gradually lift the hand in front of you until it is over your head. Do the same also with the hand remaining at your side and lift the hand away from your body until it is again over your head.  Repeat this five times to begin with, and gradually increase to ten times. Do this four times per day. As you grow stronger you can gradually increase the weight.  Document Released: 02/08/2003 Document Revised: 05/17/2013 Document Reviewed: 05/12/2005  Exit[employer]® Patient Information ©2015 [employer]. This information is not intended to replace advice given to you by your health care provider. Make sure you discuss any questions you have with your health care provider.

## 2014-04-06 NOTE — ED Provider Notes (Signed)
CSN: [id number]     Arrival date & time 04/06/14  1018 History   First MD Initiated Contact with Patient 04/06/14 1026     Chief Complaint  Patient presents with  . Shoulder Pain     (Consider location/radiation/quality/duration/timing/severity/associated sxs/prior Treatment) HPI Comments: Pt comes in c/o left shoulder pain that started this morning when she woke up. No known injury. Pt states that she is unable to lift the left arm or rotate her arm to the back. Denies numbness or weakness. No history of  Similar symptoms. No cp, jaw pain or sob.  The history is provided by the patient. No language interpreter was used.    Past Medical History  Diagnosis Date  . Diabetes mellitus   . Bipolar disorder   . Depression   . Anxiety   . Hypertension   . Anemia    Past Surgical History  Procedure Laterality Date  . Cesarean section    . Tubal ligation      X3  . Cholecystectomy  06/02/2012    Procedure: LAPAROSCOPIC CHOLECYSTECTOMY;  Surgeon: Jamesetta So, MD;  Location: [facility];  Service: General;  Laterality: N/A;  Attempted laparoscopic cholecystectomy  . Cholecystectomy  06/02/2012    Procedure: CHOLECYSTECTOMY;  Surgeon: Jamesetta So, MD;  Location: [facility];  Service: General;  Laterality: N/A;  converted to open at  0905  . Breast surgery     Family History  Problem Relation Age of Onset  . Stroke Other   . Diabetes Other   . Cancer Other   . Seizures Other   . Hypertension Mother   . Hypertension Father    History  Substance Use Topics  . Smoking status: Never Smoker   . Smokeless tobacco: Never Used  . Alcohol Use: No   OB History    Gravida Para Term Preterm AB TAB SAB Ectopic Multiple Living   3 3 3       3      Review of Systems  All other systems reviewed and are negative.     Allergies  Review of patient's allergies indicates no known allergies.  Home Medications   Prior to Admission medications   Medication Sig Start Date End Date Taking?  Authorizing Provider  albuterol (PROVENTIL HFA;VENTOLIN HFA) 108 (90 BASE) MCG/ACT inhaler Inhale 2 puffs into the lungs every 6 (six) hours as needed for wheezing.   Yes Historical Provider, MD  aspirin EC 81 MG tablet Take 81 mg by mouth daily.   Yes Historical Provider, MD  benztropine (COGENTIN) 1 MG tablet Take 0.5-1 mg by mouth 2 (two) times daily. Takes one-half tablet in the morning, may take up to one whole tablet at bedtime 02/16/14  Yes Historical Provider, MD  Cholecalciferol 50000 UNITS TABS Take 1 tablet by mouth every 7 (seven) days. Takes on Sundays   Yes Historical Provider, MD  DULoxetine (CYMBALTA) 60 MG capsule Take 60 mg by mouth daily at 6 PM.    Yes Historical Provider, MD  exenatide (BYETTA 10 MCG PEN) 10 MCG/0.04ML SOLN Inject 10 mcg into the skin 2 (two) times daily with a meal.    Yes Historical Provider, MD  HUMULIN R 500 UNIT/ML SOLN injection Inject 20 Units into the skin 3 (three) times daily. 04/20/13  Yes Historical Provider, MD  HYDROcodone-acetaminophen (NORCO/VICODIN) 5-325 MG per tablet Take 1-2 tablets by mouth every 6 (six) hours as needed for moderate pain. 02/23/14  Yes Janice Norrie, MD  lithium carbonate 300  MG capsule Take 600 mg by mouth 2 (two) times daily. Takes 600 mg in the morning and 600 mg at night   Yes Historical Provider, MD  metFORMIN (GLUCOPHAGE) 1000 MG tablet Take 1,000 mg by mouth 2 (two) times daily. 04/20/13  Yes Historical Provider, MD  simvastatin (ZOCOR) 20 MG tablet Take 20 mg by mouth daily at 6 PM.    Yes Historical Provider, MD  thiothixene (NAVANE) 5 MG capsule Take 5 mg by mouth at bedtime.  04/27/13  Yes Historical Provider, MD  traZODone (DESYREL) 100 MG tablet Take 150 mg by mouth at bedtime.    Yes Historical Provider, MD   BP 136/102 mmHg  Pulse 91  Temp(Src) 98 F (36.7 C) (Oral)  Resp 18  Ht 5\' 2"  (1.575 m)  Wt 154 lb (69.854 kg)  BMI 28.16 kg/m2  SpO2 100%  LMP 01/23/2013 Physical Exam  Constitutional: She is oriented  to person, place, and time. She appears well-developed and well-nourished.  Cardiovascular: Normal rate and regular rhythm.   Pulmonary/Chest: Effort normal and breath sounds normal.  Musculoskeletal:  Generalized tenderness to the left should. Unable to posteriorly rotate or raise above the shoulder level no redness warmth or swelling noted  Neurological: She is alert and oriented to person, place, and time.  Skin: Skin is warm and dry.  Psychiatric: She has a normal mood and affect.  Nursing note and vitals reviewed.   ED Course  Procedures (including critical care time) Labs Review Labs Reviewed - No data to display  Imaging Review Dg Shoulder Left  04/06/2014   CLINICAL DATA:  Left shoulder pain today, no known injury, initial evaluation  EXAM: LEFT SHOULDER - 2+ VIEW  COMPARISON:  None.  FINDINGS: There is no evidence of fracture or dislocation. There is no evidence of arthropathy or other focal bone abnormality. Soft tissues are unremarkable.  IMPRESSION: Negative.   Electronically Signed   By: Skipper Cliche M.D.   On: 04/06/2014 11:19     EKG Interpretation None      MDM   Final diagnoses:  Pain  Frozen shoulder, left    No bony abnormality noted. Exam consistent with frozen shoulder discussed orthopedist follow up. Considered cardiac in nature although unlikely base on rom    Glendell Docker, NP 04/06/14 [place], MD 04/06/14 1505

## 2014-04-06 NOTE — ED Notes (Signed)
Pt co lt shoulder pain, denies injury.

## 2014-04-11 ENCOUNTER — Emergency Department (HOSPITAL_COMMUNITY)
Admission: EM | Admit: 2014-04-11 | Discharge: 2014-04-11 | Discharge status: Against Medical Advice | Payer: Medicare HMO | Attending: Emergency Medicine | Admitting: Emergency Medicine

## 2014-04-11 ENCOUNTER — Encounter (HOSPITAL_COMMUNITY): Payer: Self-pay

## 2014-04-11 DIAGNOSIS — J029 Acute pharyngitis, unspecified: Secondary | ICD-10-CM | POA: Diagnosis present

## 2014-04-11 DIAGNOSIS — I10 Essential (primary) hypertension: Secondary | ICD-10-CM | POA: Diagnosis not present

## 2014-04-11 DIAGNOSIS — E119 Type 2 diabetes mellitus without complications: Secondary | ICD-10-CM | POA: Insufficient documentation

## 2014-04-11 NOTE — ED Notes (Signed)
Pt called again for room. No answer.

## 2014-04-11 NOTE — ED Notes (Signed)
Sore throat for 2 days. I have been drinking hot tea with honey. Not helping per pt.

## 2014-04-11 NOTE — ED Notes (Signed)
Pt called for room. No answer 

## 2014-04-11 NOTE — ED Notes (Signed)
Called patient, was not in waiting room.

## 2014-08-01 DIAGNOSIS — E119 Type 2 diabetes mellitus without complications: Secondary | ICD-10-CM | POA: Diagnosis not present

## 2014-08-01 DIAGNOSIS — R197 Diarrhea, unspecified: Secondary | ICD-10-CM | POA: Diagnosis not present

## 2014-08-01 DIAGNOSIS — F251 Schizoaffective disorder, depressive type: Secondary | ICD-10-CM | POA: Diagnosis not present

## 2014-08-01 DIAGNOSIS — E559 Vitamin D deficiency, unspecified: Secondary | ICD-10-CM | POA: Diagnosis not present

## 2014-08-03 DIAGNOSIS — E559 Vitamin D deficiency, unspecified: Secondary | ICD-10-CM | POA: Diagnosis not present

## 2014-08-03 DIAGNOSIS — F251 Schizoaffective disorder, depressive type: Secondary | ICD-10-CM | POA: Diagnosis not present

## 2014-08-03 DIAGNOSIS — E119 Type 2 diabetes mellitus without complications: Secondary | ICD-10-CM | POA: Diagnosis not present

## 2014-08-21 ENCOUNTER — Other Ambulatory Visit (HOSPITAL_COMMUNITY): Payer: Self-pay | Admitting: Internal Medicine

## 2014-08-21 DIAGNOSIS — Z1231 Encounter for screening mammogram for malignant neoplasm of breast: Secondary | ICD-10-CM

## 2014-09-04 DIAGNOSIS — F251 Schizoaffective disorder, depressive type: Secondary | ICD-10-CM | POA: Diagnosis not present

## 2014-09-04 DIAGNOSIS — E119 Type 2 diabetes mellitus without complications: Secondary | ICD-10-CM | POA: Diagnosis not present

## 2014-09-04 DIAGNOSIS — E559 Vitamin D deficiency, unspecified: Secondary | ICD-10-CM | POA: Diagnosis not present

## 2014-09-13 ENCOUNTER — Ambulatory Visit (HOSPITAL_COMMUNITY): Payer: Medicare HMO

## 2014-09-14 ENCOUNTER — Other Ambulatory Visit (HOSPITAL_COMMUNITY): Payer: Self-pay | Admitting: Internal Medicine

## 2014-09-14 DIAGNOSIS — Z1231 Encounter for screening mammogram for malignant neoplasm of breast: Secondary | ICD-10-CM

## 2014-09-19 DIAGNOSIS — I10 Essential (primary) hypertension: Secondary | ICD-10-CM | POA: Diagnosis not present

## 2014-09-19 DIAGNOSIS — E119 Type 2 diabetes mellitus without complications: Secondary | ICD-10-CM | POA: Diagnosis not present

## 2014-09-19 DIAGNOSIS — E785 Hyperlipidemia, unspecified: Secondary | ICD-10-CM | POA: Diagnosis not present

## 2014-09-21 ENCOUNTER — Encounter (HOSPITAL_COMMUNITY): Payer: Self-pay

## 2014-09-21 ENCOUNTER — Emergency Department (HOSPITAL_COMMUNITY)
Admission: EM | Admit: 2014-09-21 | Discharge: 2014-09-21 | Disposition: A | Discharge status: Home / Self Care | Payer: Medicare Other | Attending: Emergency Medicine | Admitting: Emergency Medicine

## 2014-09-21 DIAGNOSIS — E119 Type 2 diabetes mellitus without complications: Secondary | ICD-10-CM | POA: Insufficient documentation

## 2014-09-21 DIAGNOSIS — M25532 Pain in left wrist: Secondary | ICD-10-CM | POA: Diagnosis not present

## 2014-09-21 DIAGNOSIS — Z7982 Long term (current) use of aspirin: Secondary | ICD-10-CM | POA: Diagnosis not present

## 2014-09-21 DIAGNOSIS — Z862 Personal history of diseases of the blood and blood-forming organs and certain disorders involving the immune mechanism: Secondary | ICD-10-CM | POA: Insufficient documentation

## 2014-09-21 DIAGNOSIS — F419 Anxiety disorder, unspecified: Secondary | ICD-10-CM | POA: Insufficient documentation

## 2014-09-21 DIAGNOSIS — F319 Bipolar disorder, unspecified: Secondary | ICD-10-CM | POA: Diagnosis not present

## 2014-09-21 DIAGNOSIS — Z79899 Other long term (current) drug therapy: Secondary | ICD-10-CM | POA: Diagnosis not present

## 2014-09-21 DIAGNOSIS — I10 Essential (primary) hypertension: Secondary | ICD-10-CM | POA: Diagnosis not present

## 2014-09-21 DIAGNOSIS — Z794 Long term (current) use of insulin: Secondary | ICD-10-CM | POA: Insufficient documentation

## 2014-09-21 HISTORY — DX: Schizoaffective disorder, unspecified: F25.9

## 2014-09-21 MED ORDER — ACETAMINOPHEN-CODEINE #3 300-30 MG PO TABS: 1.0000 | ORAL_TABLET | Freq: Four times a day (QID) | ORAL | Status: DC | PRN

## 2014-09-21 MED ORDER — DICLOFENAC SODIUM 75 MG PO TBEC: 75.0000 mg | DELAYED_RELEASE_TABLET | Freq: Two times a day (BID) | ORAL | Status: DC

## 2014-09-21 NOTE — ED Notes (Signed)
Pt c/o pain in left wrist for past couple of weeks, denies injury.  Reports history of carpal tunnel surgery.  Radial pulse present.

## 2014-09-21 NOTE — ED Provider Notes (Signed)
CSN: [id number]     Arrival date & time 09/21/14  1151 History   First MD Initiated Contact with Patient 09/21/14 1238     Chief Complaint  Patient presents with  . Wrist Pain     (Consider location/radiation/quality/duration/timing/severity/associated sxs/prior Treatment) Patient is a 44 y.o. female presenting with wrist pain. The history is provided by the patient.  Wrist Pain This is a recurrent problem. The current episode started 1 to 4 weeks ago. The problem occurs intermittently. Associated symptoms include arthralgias. Pertinent negatives include no weakness. Exacerbated by: moving or bending the wrist. She has tried nothing for the symptoms. The treatment provided no relief.    Past Medical History  Diagnosis Date  . Diabetes mellitus   . Bipolar disorder   . Depression   . Anxiety   . Hypertension   . Anemia   . Schizoaffective disorder    Past Surgical History  Procedure Laterality Date  . Cesarean section    . Tubal ligation      X3  . Cholecystectomy  06/02/2012    Procedure: LAPAROSCOPIC CHOLECYSTECTOMY;  Surgeon: Jamesetta So, MD;  Location: [facility];  Service: General;  Laterality: N/A;  Attempted laparoscopic cholecystectomy  . Cholecystectomy  06/02/2012    Procedure: CHOLECYSTECTOMY;  Surgeon: Jamesetta So, MD;  Location: [facility];  Service: General;  Laterality: N/A;  converted to open at  0905  . Breast surgery     Family History  Problem Relation Age of Onset  . Stroke Other   . Diabetes Other   . Cancer Other   . Seizures Other   . Hypertension Mother   . Hypertension Father    History  Substance Use Topics  . Smoking status: Never Smoker   . Smokeless tobacco: Never Used  . Alcohol Use: No   OB History    Gravida Para Term Preterm AB TAB SAB Ectopic Multiple Living   3 3 3       3      Review of Systems  Musculoskeletal: Positive for arthralgias.  Neurological: Negative for weakness.  Psychiatric/Behavioral: The patient is nervous/anxious.         Bipolar disorder  All other systems reviewed and are negative.     Allergies  Review of patient's allergies indicates no known allergies.  Home Medications   Prior to Admission medications   Medication Sig Start Date End Date Taking? Authorizing Provider  albuterol (PROVENTIL HFA;VENTOLIN HFA) 108 (90 BASE) MCG/ACT inhaler Inhale 2 puffs into the lungs every 6 (six) hours as needed for wheezing.    Historical Provider, MD  aspirin EC 81 MG tablet Take 81 mg by mouth daily.    Historical Provider, MD  benztropine (COGENTIN) 1 MG tablet Take 0.5-1 mg by mouth 2 (two) times daily. Takes one-half tablet in the morning, may take up to one whole tablet at bedtime 02/16/14   Historical Provider, MD  Cholecalciferol 50000 UNITS TABS Take 1 tablet by mouth every 7 (seven) days. Takes on Sundays    Historical Provider, MD  DULoxetine (CYMBALTA) 60 MG capsule Take 60 mg by mouth daily at 6 PM.     Historical Provider, MD  exenatide (BYETTA 10 MCG PEN) 10 MCG/0.04ML SOLN Inject 10 mcg into the skin 2 (two) times daily with a meal.     Historical Provider, MD  HUMULIN R 500 UNIT/ML SOLN injection Inject 20 Units into the skin 3 (three) times daily. 04/20/13   Historical Provider, MD  HYDROcodone-acetaminophen (NORCO/VICODIN)  5-325 MG per tablet Take 1-2 tablets by mouth every 4 (four) hours as needed. 04/06/14   Glendell Docker, NP  lithium carbonate 300 MG capsule Take 600 mg by mouth 2 (two) times daily. Takes 600 mg in the morning and 600 mg at night    Historical Provider, MD  metFORMIN (GLUCOPHAGE) 1000 MG tablet Take 1,000 mg by mouth 2 (two) times daily. 04/20/13   Historical Provider, MD  simvastatin (ZOCOR) 20 MG tablet Take 20 mg by mouth daily at 6 PM.     Historical Provider, MD  thiothixene (NAVANE) 5 MG capsule Take 5 mg by mouth at bedtime.  04/27/13   Historical Provider, MD  traZODone (DESYREL) 100 MG tablet Take 150 mg by mouth at bedtime.     Historical Provider, MD   BP  133/97 mmHg  Pulse 99  Temp(Src) 98.6 F (37 C) (Oral)  Resp 24  Ht 5\' 2"  (1.575 m)  Wt 167 lb (75.751 kg)  BMI 30.54 kg/m2  SpO2 97%  LMP 01/23/2013 Physical Exam  Constitutional: She is oriented to person, place, and time. She appears well-developed and well-nourished.  Non-toxic appearance.  HENT:  Head: Normocephalic.  Right Ear: Tympanic membrane and external ear normal.  Left Ear: Tympanic membrane and external ear normal.  Eyes: EOM and lids are normal. Pupils are equal, round, and reactive to light.  Neck: Normal range of motion. Neck supple. Carotid bruit is not present.  Cardiovascular: Normal rate, regular rhythm, normal heart sounds, intact distal pulses and normal pulses.   Pulmonary/Chest: Breath sounds normal. No respiratory distress.  Abdominal: Soft. Bowel sounds are normal. There is no tenderness. There is no guarding.  Musculoskeletal:       Left wrist: She exhibits decreased range of motion and tenderness. She exhibits no effusion and no deformity.  Lymphadenopathy:       Head (right side): No submandibular adenopathy present.       Head (left side): No submandibular adenopathy present.    She has no cervical adenopathy.  Neurological: She is alert and oriented to person, place, and time. She has normal strength. No cranial nerve deficit or sensory deficit.  Skin: Skin is warm and dry.  Psychiatric: She has a normal mood and affect. Her speech is normal.  Nursing note and vitals reviewed.   ED Course  Procedures (including critical care time) Labs Review Labs Reviewed - No data to display  Imaging Review No results found.   EKG Interpretation None      MDM  Exam favors exacerbation of previous carpal tunnel syndrome. I have asked the patient that the observant of diabetic neuropathy changes as well.  The patient will be fitted with a wrist splint. Prescription for diclofenac and Tylenol codeine given to the patient.    Final diagnoses:  None     **I have reviewed nursing notes, vital signs, and all appropriate lab and imaging results for this patient.Lily Kocher, PA-C 09/22/14 [place], MD 09/22/14 1537

## 2014-09-21 NOTE — Discharge Instructions (Signed)
Wrist Pain °A wrist sprain happens when the bands of tissue that hold the wrist joints together (ligament) stretch too much or tear. A wrist strain happens when muscles or bands of tissue that connect muscles to bones (tendons) are stretched or pulled. °HOME CARE °· Put ice on the injured area. °¨ Put ice in a plastic bag. °¨ Place a towel between your skin and the bag. °¨ Leave the ice on for 15-20 minutes, 03-04 times a day, for the first 2 days. °· Raise (elevate) the injured wrist to lessen puffiness (swelling). °· Rest the injured wrist for at least 48 hours or as told by your doctor. °· Wear a splint, cast, or an elastic wrap as told by your doctor. °· Only take medicine as told by your doctor. °· Follow up with your doctor as told. This is important. °GET HELP RIGHT AWAY IF:  °· The fingers are puffy, very red, white, or cold and blue. °· The fingers lose feeling (numb) or tingle. °· The pain gets worse. °· It is hard to move the fingers. °MAKE SURE YOU:  °· Understand these instructions. °· Will watch your condition. °· Will get help right away if you are not doing well or get worse. °Document Released: 10/29/2007 Document Revised: 08/04/2011 Document Reviewed: 07/03/2010 °[employer]® Patient Information ©2015 [employer]. This information is not intended to replace advice given to you by your health care provider. Make sure you discuss any questions you have with your health care provider. ° °

## 2014-10-05 ENCOUNTER — Ambulatory Visit (HOSPITAL_COMMUNITY)
Admission: RE | Admit: 2014-10-05 | Discharge: 2014-10-05 | Disposition: A | Discharge status: Home / Self Care | Payer: Medicare Other | Source: Ambulatory Visit | Attending: Internal Medicine | Admitting: Internal Medicine

## 2014-10-05 DIAGNOSIS — Z1231 Encounter for screening mammogram for malignant neoplasm of breast: Secondary | ICD-10-CM | POA: Insufficient documentation

## 2014-10-10 ENCOUNTER — Encounter: Payer: Self-pay | Admitting: Orthopedic Surgery

## 2014-10-10 ENCOUNTER — Ambulatory Visit: Payer: Medicare HMO | Admitting: Orthopedic Surgery

## 2014-10-17 DIAGNOSIS — I10 Essential (primary) hypertension: Secondary | ICD-10-CM | POA: Diagnosis not present

## 2014-10-17 DIAGNOSIS — E119 Type 2 diabetes mellitus without complications: Secondary | ICD-10-CM | POA: Diagnosis not present

## 2014-10-17 DIAGNOSIS — E785 Hyperlipidemia, unspecified: Secondary | ICD-10-CM | POA: Diagnosis not present

## 2014-11-22 DIAGNOSIS — E119 Type 2 diabetes mellitus without complications: Secondary | ICD-10-CM | POA: Diagnosis not present

## 2014-11-22 DIAGNOSIS — I10 Essential (primary) hypertension: Secondary | ICD-10-CM | POA: Diagnosis not present

## 2015-01-19 ENCOUNTER — Encounter (HOSPITAL_COMMUNITY): Payer: Self-pay | Admitting: *Deleted

## 2015-01-19 DIAGNOSIS — Z7982 Long term (current) use of aspirin: Secondary | ICD-10-CM | POA: Insufficient documentation

## 2015-01-19 DIAGNOSIS — E1165 Type 2 diabetes mellitus with hyperglycemia: Secondary | ICD-10-CM | POA: Insufficient documentation

## 2015-01-19 DIAGNOSIS — Z794 Long term (current) use of insulin: Secondary | ICD-10-CM | POA: Diagnosis not present

## 2015-01-19 DIAGNOSIS — R1084 Generalized abdominal pain: Secondary | ICD-10-CM | POA: Diagnosis not present

## 2015-01-19 DIAGNOSIS — Z862 Personal history of diseases of the blood and blood-forming organs and certain disorders involving the immune mechanism: Secondary | ICD-10-CM | POA: Diagnosis not present

## 2015-01-19 DIAGNOSIS — I10 Essential (primary) hypertension: Secondary | ICD-10-CM | POA: Insufficient documentation

## 2015-01-19 DIAGNOSIS — Z791 Long term (current) use of non-steroidal anti-inflammatories (NSAID): Secondary | ICD-10-CM | POA: Insufficient documentation

## 2015-01-19 DIAGNOSIS — F319 Bipolar disorder, unspecified: Secondary | ICD-10-CM | POA: Insufficient documentation

## 2015-01-19 DIAGNOSIS — R200 Anesthesia of skin: Secondary | ICD-10-CM | POA: Insufficient documentation

## 2015-01-19 DIAGNOSIS — F419 Anxiety disorder, unspecified: Secondary | ICD-10-CM | POA: Insufficient documentation

## 2015-01-19 DIAGNOSIS — H0261 Xanthelasma of right upper eyelid: Secondary | ICD-10-CM | POA: Diagnosis not present

## 2015-01-19 DIAGNOSIS — R110 Nausea: Secondary | ICD-10-CM | POA: Insufficient documentation

## 2015-01-19 DIAGNOSIS — F259 Schizoaffective disorder, unspecified: Secondary | ICD-10-CM | POA: Insufficient documentation

## 2015-01-19 DIAGNOSIS — Z79899 Other long term (current) drug therapy: Secondary | ICD-10-CM | POA: Insufficient documentation

## 2015-01-19 DIAGNOSIS — R05 Cough: Secondary | ICD-10-CM | POA: Diagnosis not present

## 2015-01-19 DIAGNOSIS — H0264 Xanthelasma of left upper eyelid: Secondary | ICD-10-CM | POA: Diagnosis not present

## 2015-01-19 LAB — CBG MONITORING, ED: Glucose-Capillary: 408 mg/dL — ABNORMAL HIGH (ref 65–99)

## 2015-01-19 NOTE — ED Notes (Signed)
My blood sugar has been up for 4 days. I have talked to my doctor about it and I have been taking 20 units of long acting and 20 units of short acting every hour today and it has not came down at all.

## 2015-01-19 NOTE — ED Notes (Signed)
Blood sugar 408 at triage.

## 2015-01-20 ENCOUNTER — Emergency Department (HOSPITAL_COMMUNITY)
Admission: EM | Admit: 2015-01-20 | Discharge: 2015-01-20 | Disposition: A | Discharge status: Home / Self Care | Payer: Medicare Other | Attending: Emergency Medicine | Admitting: Emergency Medicine

## 2015-01-20 DIAGNOSIS — R739 Hyperglycemia, unspecified: Secondary | ICD-10-CM

## 2015-01-20 LAB — COMPREHENSIVE METABOLIC PANEL
ALT: 35 U/L (ref 14–54)
AST: 31 U/L (ref 15–41)
Albumin: 4.2 g/dL (ref 3.5–5.0)
Alkaline Phosphatase: 99 U/L (ref 38–126)
Anion gap: 10 (ref 5–15)
BUN: 8 mg/dL (ref 6–20)
CO2: 28 mmol/L (ref 22–32)
Calcium: 9.5 mg/dL (ref 8.9–10.3)
Chloride: 96 mmol/L — ABNORMAL LOW (ref 101–111)
Creatinine, Ser: 0.82 mg/dL (ref 0.44–1.00)
GFR calc Af Amer: >60 mL/min (ref >60)
GFR calc non Af Amer: >60 mL/min (ref >60)
Glucose, Bld: 425 mg/dL — ABNORMAL HIGH (ref 65–99)
Potassium: 3.4 mmol/L — ABNORMAL LOW (ref 3.5–5.1)
Sodium: 134 mmol/L — ABNORMAL LOW (ref 135–145)
Total Bilirubin: 1.4 mg/dL — ABNORMAL HIGH (ref 0.3–1.2)
Total Protein: 8 g/dL (ref 6.5–8.1)

## 2015-01-20 LAB — CBC WITH DIFFERENTIAL/PLATELET
Basophils Absolute: 0 10*3/uL (ref 0.0–0.1)
Basophils Relative: 0 % (ref 0–1)
Eosinophils Absolute: 0.2 10*3/uL (ref 0.0–0.7)
Eosinophils Relative: 2 % (ref 0–5)
HCT: 37.5 % (ref 36.0–46.0)
Hemoglobin: 13 g/dL (ref 12.0–15.0)
Lymphocytes Relative: 45 % (ref 12–46)
Lymphs Abs: 3.3 10*3/uL (ref 0.7–4.0)
MCH: 28.5 pg (ref 26.0–34.0)
MCHC: 34.7 g/dL (ref 30.0–36.0)
MCV: 82.2 fL (ref 78.0–100.0)
Monocytes Absolute: 0.4 10*3/uL (ref 0.1–1.0)
Monocytes Relative: 6 % (ref 3–12)
Neutro Abs: 3.5 10*3/uL (ref 1.7–7.7)
Neutrophils Relative %: 47 % (ref 43–77)
Platelets: 313 10*3/uL (ref 150–400)
RBC: 4.56 MIL/uL (ref 3.87–5.11)
RDW: 12.3 % (ref 11.5–15.5)
WBC: 7.3 10*3/uL (ref 4.0–10.5)

## 2015-01-20 LAB — URINALYSIS, ROUTINE W REFLEX MICROSCOPIC
Bilirubin Urine: NEGATIVE
Glucose, UA: >1000 mg/dL — AB
Hgb urine dipstick: NEGATIVE
Ketones, ur: NEGATIVE mg/dL
Leukocytes, UA: NEGATIVE
Nitrite: NEGATIVE
Protein, ur: NEGATIVE mg/dL
Specific Gravity, Urine: 1.01 (ref 1.005–1.030)
Urobilinogen, UA: 1 mg/dL (ref 0.0–1.0)
pH: 6 (ref 5.0–8.0)

## 2015-01-20 LAB — URINE MICROSCOPIC-ADD ON

## 2015-01-20 LAB — TROPONIN I: Troponin I: <0.03 ng/mL (ref <0.031)

## 2015-01-20 LAB — CBG MONITORING, ED: Glucose-Capillary: 296 mg/dL — ABNORMAL HIGH (ref 65–99)

## 2015-01-20 MED ORDER — SODIUM CHLORIDE 0.9 % IV SOLN
1000.0000 mL | INTRAVENOUS | Status: DC
  Administered 2015-01-20: 1000 mL via INTRAVENOUS

## 2015-01-20 MED ORDER — ONDANSETRON HCL 4 MG/2ML IJ SOLN
4.0000 mg | Freq: Once | INTRAMUSCULAR | Status: AC
  Administered 2015-01-20: 4 mg via INTRAVENOUS
  Filled 2015-01-20: qty 2

## 2015-01-20 MED ORDER — SODIUM CHLORIDE 0.9 % IV SOLN
1000.0000 mL | Freq: Once | INTRAVENOUS | Status: AC
  Administered 2015-01-20: 1000 mL via INTRAVENOUS

## 2015-01-20 MED ORDER — INSULIN REGULAR HUMAN 100 UNIT/ML IJ SOLN
INTRAMUSCULAR | Status: DC
  Administered 2015-01-20: 3.5 [IU]/h via INTRAVENOUS
  Filled 2015-01-20: qty 2.5

## 2015-01-20 NOTE — ED Notes (Signed)
Pt states that she has been poorly controled since she found out she has DM.  Very high sugar levels last 4 days.  Per PCP took 20 units regular and long acting insulin every hour between 1600 and 2200.  Blood sugar still high.  Complaining of nausea, thirst, and frequent urination

## 2015-01-20 NOTE — ED Notes (Signed)
Insulin dripped per Dr. Tomi Bamberger

## 2015-01-20 NOTE — ED Notes (Signed)
Pt C/O chest tightness.  Informed Dr. Tomi Bamberger

## 2015-01-20 NOTE — Discharge Instructions (Signed)
Look over the dietary information about diabetes and try to start implementing it. Be careful as you change your diet, your insulin doses may need to be adjusted. Consider doing a walk every morning, this will also help control your blood sugar. Ask Dr Maudie Mercury about getting a dietary consult to help you change your diet.   Return if you feel worse.

## 2015-01-20 NOTE — ED Provider Notes (Signed)
CSN: [id number]     Arrival date & time 01/19/15  2314 History  This chart was scribed for Rolland Porter, MD by Rayna Sexton, ED scribe. This patient was seen in room APA02/APA02 and the patient's care was started at 12:51 AM.   Chief Complaint  Patient presents with  . Hyperglycemia   The history is provided by the patient. No language interpreter was used.    HPI Comments: Kathryn [name] is a 44 y.o. female, with a hx of IDDM, who presents to the Emergency Department complaining of due to high blood sugar with onset 4 days ago. She notes being diagnosed with DM in 05/2009 and further notes that her glucose levels have never been fully managed. She notes associated urinary  frequency with 4x episodes per night, nausea, cough, diffuse abd pain, bilateral foot numbness and polydipsi. She notes taking 2 types of insulin to manage her DM in addition to metformin. Pt denies a hx of smoking. Pt denies having ever been admitted to the hospital for her DM. She denies diarrhea and vomiting. She states since 4:00 she is talked to her on-call physician and she has had 120 units of her slow acting insulin and 120 units of her fast acting insulin without improvement.  PCP Dr Maudie Mercury  Past Medical History  Diagnosis Date  . Diabetes mellitus   . Bipolar disorder   . Depression   . Anxiety   . Hypertension   . Anemia   . Schizoaffective disorder    Past Surgical History  Procedure Laterality Date  . Cesarean section    . Tubal ligation      X3  . Cholecystectomy  06/02/2012    Procedure: LAPAROSCOPIC CHOLECYSTECTOMY;  Surgeon: Jamesetta So, MD;  Location: [facility];  Service: General;  Laterality: N/A;  Attempted laparoscopic cholecystectomy  . Cholecystectomy  06/02/2012    Procedure: CHOLECYSTECTOMY;  Surgeon: Jamesetta So, MD;  Location: [facility];  Service: General;  Laterality: N/A;  converted to open at  0905  . Breast surgery     Family History  Problem Relation Age of Onset  . Stroke Other   .  Diabetes Other   . Cancer Other   . Seizures Other   . Hypertension Mother   . Hypertension Father    Social History  Substance Use Topics  . Smoking status: Never Smoker   . Smokeless tobacco: Never Used  . Alcohol Use: No   employed  OB History    Gravida Para Term Preterm AB TAB SAB Ectopic Multiple Living   3 3 3       3      Review of Systems  Respiratory: Positive for cough.   Gastrointestinal: Positive for nausea and abdominal pain.  Genitourinary: Positive for frequency.  Neurological: Positive for numbness.  All other systems reviewed and are negative.  Allergies  Review of patient's allergies indicates no known allergies.  Home Medications   Prior to Admission medications   Medication Sig Start Date End Date Taking? Authorizing Provider  acetaminophen-codeine (TYLENOL #3) 300-30 MG per tablet Take 1-2 tablets by mouth every 6 (six) hours as needed for moderate pain. 09/21/14   Lily Kocher, PA-C  albuterol (PROVENTIL HFA;VENTOLIN HFA) 108 (90 BASE) MCG/ACT inhaler Inhale 2 puffs into the lungs every 6 (six) hours as needed for wheezing.    Historical Provider, MD  aspirin EC 81 MG tablet Take 81 mg by mouth daily.    Historical Provider, MD  benztropine (COGENTIN)  1 MG tablet Take 0.5-1 mg by mouth 2 (two) times daily. Takes one-half tablet in the morning, may take up to one whole tablet at bedtime 02/16/14   Historical Provider, MD  Cholecalciferol 50000 UNITS TABS Take 1 tablet by mouth every 7 (seven) days. Takes on Sundays    Historical Provider, MD  diclofenac (VOLTAREN) 75 MG EC tablet Take 1 tablet (75 mg total) by mouth 2 (two) times daily. 09/21/14   Lily Kocher, PA-C  DULoxetine (CYMBALTA) 60 MG capsule Take 60 mg by mouth daily at 6 PM.     Historical Provider, MD  exenatide (BYETTA 10 MCG PEN) 10 MCG/0.04ML SOLN Inject 10 mcg into the skin 2 (two) times daily with a meal.     Historical Provider, MD  HUMULIN R 500 UNIT/ML SOLN injection Inject 20 Units  into the skin 3 (three) times daily. 04/20/13   Historical Provider, MD  HYDROcodone-acetaminophen (NORCO/VICODIN) 5-325 MG per tablet Take 1-2 tablets by mouth every 4 (four) hours as needed. 04/06/14   Glendell Docker, NP  lithium carbonate 300 MG capsule Take 600 mg by mouth 2 (two) times daily. Takes 600 mg in the morning and 600 mg at night    Historical Provider, MD  metFORMIN (GLUCOPHAGE) 1000 MG tablet Take 1,000 mg by mouth 2 (two) times daily. 04/20/13   Historical Provider, MD  simvastatin (ZOCOR) 20 MG tablet Take 20 mg by mouth daily at 6 PM.     Historical Provider, MD  thiothixene (NAVANE) 5 MG capsule Take 5 mg by mouth at bedtime.  04/27/13   Historical Provider, MD  traZODone (DESYREL) 100 MG tablet Take 150 mg by mouth at bedtime.     Historical Provider, MD   BP 168/100 mmHg  Pulse 74  Temp(Src) 98.5 F (36.9 C) (Oral)  Resp 16  Ht 5\' 2"  (1.575 m)  Wt 165 lb (74.844 kg)  BMI 30.17 kg/m2  SpO2 100%  LMP 01/23/2013  Vital signs normal   Physical Exam  Constitutional: She is oriented to person, place, and time. She appears well-developed and well-nourished.  Non-toxic appearance. She does not appear ill. No distress.  HENT:  Head: Normocephalic and atraumatic.  Right Ear: External ear normal.  Left Ear: External ear normal.  Nose: Nose normal. No mucosal edema or rhinorrhea.  Mouth/Throat: Oropharynx is clear and moist and mucous membranes are normal. No dental abscesses or uvula swelling.  Tongue dry; 1 small xanthelasma on each upper eyelid.   Eyes: Conjunctivae and EOM are normal. Pupils are equal, round, and reactive to light.  Neck: Normal range of motion and full passive range of motion without pain. Neck supple.  Cardiovascular: Normal rate, regular rhythm and normal heart sounds.  Exam reveals no gallop and no friction rub.   No murmur heard. Pulmonary/Chest: Effort normal and breath sounds normal. No respiratory distress. She has no wheezes. She has no  rhonchi. She has no rales. She exhibits no tenderness and no crepitus.  Abdominal: Soft. Normal appearance and bowel sounds are normal. She exhibits no distension. There is no tenderness. There is no rebound and no guarding.  Mild diffuse abd pain to palpation  Musculoskeletal: Normal range of motion. She exhibits no edema or tenderness.  Moves all extremities well.   Neurological: She is alert and oriented to person, place, and time. She has normal strength. No cranial nerve deficit.  Skin: Skin is warm, dry and intact. No rash noted. No erythema. No pallor.  Psychiatric: She has a  normal mood and affect. Her speech is normal and behavior is normal. Her mood appears not anxious.  Nursing note and vitals reviewed.  ED Course  Procedures   Medications  0.9 %  sodium chloride infusion (0 mLs Intravenous Stopped 01/20/15 0250)    Followed by  0.9 %  sodium chloride infusion (0 mLs Intravenous Stopped 01/20/15 0204)    Followed by  0.9 %  sodium chloride infusion (1,000 mLs Intravenous New Bag/Given 01/20/15 0250)  insulin regular (NOVOLIN R,HUMULIN R) 250 Units in sodium chloride 0.9 % 250 mL (1 Units/mL) infusion (2.7 Units/hr Intravenous Rate/Dose Change 01/20/15 0224)  ondansetron (ZOFRAN) injection 4 mg (4 mg Intravenous Given 01/20/15 0302)    DIAGNOSTIC STUDIES: Oxygen Saturation is 100% on RA, normal by my interpretation.    COORDINATION OF CARE: 12:56 AM Discussed treatment plan with pt at bedside and pt agreed to plan.   Patient was started on IV fluids or her apparent dehydration and also an insulin drip. Patient's blood sugar slowly improved to 296. At this point patient states if her blood sugar gets below 200 she will start feeling bad. Her insulin drip was stopped. Her significant other relate to nursing staff that she has been drinking regular sodas and wondered if that could affect her blood sugar. Patient will be given diabetic diet discharge instructions. We also talked about  taking a walk every day which would also help with her control of her blood sugar. She also was warned if she starts changing her diet and exercising more her insulin requirements may change rapidly.  Labs Review Results for orders placed or performed during the hospital encounter of 01/20/15  Urinalysis, Routine w reflex microscopic (not at [facility])  Result Value Ref Range   Color, Urine YELLOW YELLOW   APPearance CLEAR CLEAR   Specific Gravity, Urine 1.010 1.005 - 1.030   pH 6.0 5.0 - 8.0   Glucose, UA >1000 (A) NEGATIVE mg/dL   Hgb urine dipstick NEGATIVE NEGATIVE   Bilirubin Urine NEGATIVE NEGATIVE   Ketones, ur NEGATIVE NEGATIVE mg/dL   Protein, ur NEGATIVE NEGATIVE mg/dL   Urobilinogen, UA 1.0 0.0 - 1.0 mg/dL   Nitrite NEGATIVE NEGATIVE   Leukocytes, UA NEGATIVE NEGATIVE  Comprehensive metabolic panel  Result Value Ref Range   Sodium 134 (L) 135 - 145 mmol/L   Potassium 3.4 (L) 3.5 - 5.1 mmol/L   Chloride 96 (L) 101 - 111 mmol/L   CO2 28 22 - 32 mmol/L   Glucose, Bld 425 (H) 65 - 99 mg/dL   BUN 8 6 - 20 mg/dL   Creatinine, Ser 0.82 0.44 - 1.00 mg/dL   Calcium 9.5 8.9 - 10.3 mg/dL   Total Protein 8.0 6.5 - 8.1 g/dL   Albumin 4.2 3.5 - 5.0 g/dL   AST 31 15 - 41 U/L   ALT 35 14 - 54 U/L   Alkaline Phosphatase 99 38 - 126 U/L   Total Bilirubin 1.4 (H) 0.3 - 1.2 mg/dL   GFR calc non Af Amer >60 >60 mL/min   GFR calc Af Amer >60 >60 mL/min   Anion gap 10 5 - 15  CBC with Differential  Result Value Ref Range   WBC 7.3 4.0 - 10.5 K/uL   RBC 4.56 3.87 - 5.11 MIL/uL   Hemoglobin 13.0 12.0 - 15.0 g/dL   HCT 37.5 36.0 - 46.0 %   MCV 82.2 78.0 - 100.0 fL   MCH 28.5 26.0 - 34.0 pg   MCHC 34.7  30.0 - 36.0 g/dL   RDW 12.3 11.5 - 15.5 %   Platelets 313 150 - 400 K/uL   Neutrophils Relative % 47 43 - 77 %   Neutro Abs 3.5 1.7 - 7.7 K/uL   Lymphocytes Relative 45 12 - 46 %   Lymphs Abs 3.3 0.7 - 4.0 K/uL   Monocytes Relative 6 3 - 12 %   Monocytes Absolute 0.4 0.1 - 1.0 K/uL    Eosinophils Relative 2 0 - 5 %   Eosinophils Absolute 0.2 0.0 - 0.7 K/uL   Basophils Relative 0 0 - 1 %   Basophils Absolute 0.0 0.0 - 0.1 K/uL  Urine microscopic-add on  Result Value Ref Range   Squamous Epithelial / LPF FEW (A) RARE   WBC, UA 7-10 <3 WBC/hpf   RBC / HPF 3-6 <3 RBC/hpf   Bacteria, UA FEW (A) RARE  Troponin I  Result Value Ref Range   Troponin I <0.03 <0.031 ng/mL  CBG monitoring, ED  Result Value Ref Range   Glucose-Capillary 408 (H) 65 - 99 mg/dL  CBG monitoring, ED  Result Value Ref Range   Glucose-Capillary 296 (H) 65 - 99 mg/dL   Laboratory interpretation all normal except hyperglycemia, glucosuria     Imaging Review No results found. I have personally reviewed and evaluated these images and lab results as part of my medical decision-making.   EKG Interpretation   Date/Time:  Saturday January 20 2015 01:54:17 EDT Ventricular Rate:  71 PR Interval:  175 QRS Duration: 91 QT Interval:  463 QTC Calculation: 503 R Axis:   -2 Text Interpretation:  Sinus rhythm Low voltage, precordial leads HEART  RATE DECREASED SINCE Since last tracing 28 May 2012 Borderline T wave  abnormalities Borderline prolonged QT interval Confirmed by Vaudine Dutan  MD-I,  Latanja Lehenbauer ([id number]) on 01/20/2015 2:06:16 AM      MDM   Final diagnoses:  Hyperglycemia    Plan discharge  Rolland Porter, MD, FACEP   I personally performed the services described in this documentation, which was scribed in my presence. The recorded information has been reviewed and considered.  Rolland Porter, MD, Barbette Or, MD 01/20/15 [phone]

## 2015-02-08 ENCOUNTER — Ambulatory Visit (INDEPENDENT_AMBULATORY_CARE_PROVIDER_SITE_OTHER): Payer: Medicare Other | Admitting: Psychiatry

## 2015-02-08 ENCOUNTER — Encounter (HOSPITAL_COMMUNITY): Payer: Self-pay | Admitting: Psychiatry

## 2015-02-08 VITALS — BP 158/106 | HR 74 | Ht 62.0 in | Wt 152.0 lb

## 2015-02-08 DIAGNOSIS — F4310 Post-traumatic stress disorder, unspecified: Secondary | ICD-10-CM

## 2015-02-08 DIAGNOSIS — F251 Schizoaffective disorder, depressive type: Secondary | ICD-10-CM | POA: Insufficient documentation

## 2015-02-08 DIAGNOSIS — F259 Schizoaffective disorder, unspecified: Secondary | ICD-10-CM | POA: Insufficient documentation

## 2015-02-08 DIAGNOSIS — R45851 Suicidal ideations: Secondary | ICD-10-CM | POA: Diagnosis not present

## 2015-02-08 DIAGNOSIS — R45850 Homicidal ideations: Secondary | ICD-10-CM

## 2015-02-08 MED ORDER — RISPERIDONE 2 MG PO TABS: 2.0000 mg | ORAL_TABLET | Freq: Every day | ORAL | Status: DC

## 2015-02-08 MED ORDER — DULOXETINE HCL 60 MG PO CPEP: 60.0000 mg | ORAL_CAPSULE | Freq: Every day | ORAL | Status: DC

## 2015-02-08 MED ORDER — TRAZODONE HCL 100 MG PO TABS: 100.0000 mg | ORAL_TABLET | Freq: Every day | ORAL | Status: DC

## 2015-02-08 MED ORDER — LITHIUM CARBONATE 300 MG PO CAPS: 600.0000 mg | ORAL_CAPSULE | Freq: Two times a day (BID) | ORAL | Status: DC

## 2015-02-08 MED ORDER — BENZTROPINE MESYLATE 1 MG PO TABS
1.0000 mg | ORAL_TABLET | Freq: Every day | ORAL | Status: DC
Start: 2015-02-08 — End: 2015-06-25
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
## 2015-02-10 LAB — LITHIUM LEVEL: Lithium Lvl: 0.4 mEq/L — ABNORMAL LOW (ref 0.80–1.40)

## 2015-02-21 ENCOUNTER — Ambulatory Visit (HOSPITAL_COMMUNITY): Payer: Self-pay | Admitting: Psychiatry

## 2015-03-01 ENCOUNTER — Ambulatory Visit (HOSPITAL_COMMUNITY): Payer: Self-pay | Admitting: Psychiatry

## 2015-03-02 ENCOUNTER — Ambulatory Visit (HOSPITAL_COMMUNITY): Payer: Self-pay | Admitting: Psychology

## 2015-03-29 ENCOUNTER — Encounter (HOSPITAL_COMMUNITY): Payer: Self-pay | Admitting: Psychiatry

## 2015-03-29 ENCOUNTER — Ambulatory Visit (INDEPENDENT_AMBULATORY_CARE_PROVIDER_SITE_OTHER): Payer: Medicare Other | Admitting: Psychiatry

## 2015-03-29 VITALS — BP 149/98 | HR 95 | Ht 62.0 in | Wt 165.0 lb

## 2015-03-29 DIAGNOSIS — F4310 Post-traumatic stress disorder, unspecified: Secondary | ICD-10-CM | POA: Diagnosis not present

## 2015-03-29 DIAGNOSIS — F251 Schizoaffective disorder, depressive type: Secondary | ICD-10-CM

## 2015-03-29 MED ORDER — LITHIUM CARBONATE 300 MG PO CAPS
ORAL_CAPSULE | ORAL | Status: DC
Start: 2015-03-29 — End: 2015-06-25

## 2015-03-29 MED ORDER — RISPERIDONE 3 MG PO TABS: 3.0000 mg | ORAL_TABLET | Freq: Every day | ORAL | Status: DC

## 2015-03-29 NOTE — Patient Instructions (Signed)
Increase lithium and risperdal as prescribed Check lithium level in 2 weeks
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
## 2015-04-02 ENCOUNTER — Ambulatory Visit (HOSPITAL_COMMUNITY): Payer: Self-pay | Admitting: Psychology

## 2015-04-17 ENCOUNTER — Emergency Department (HOSPITAL_COMMUNITY)
Admission: EM | Admit: 2015-04-17 | Discharge: 2015-04-17 | Discharge status: Against Medical Advice | Payer: Medicare Other | Attending: Emergency Medicine | Admitting: Emergency Medicine

## 2015-04-17 ENCOUNTER — Encounter (HOSPITAL_COMMUNITY): Payer: Self-pay | Admitting: Emergency Medicine

## 2015-04-17 DIAGNOSIS — R197 Diarrhea, unspecified: Secondary | ICD-10-CM | POA: Insufficient documentation

## 2015-04-17 DIAGNOSIS — R350 Frequency of micturition: Secondary | ICD-10-CM | POA: Diagnosis not present

## 2015-04-17 DIAGNOSIS — E119 Type 2 diabetes mellitus without complications: Secondary | ICD-10-CM | POA: Insufficient documentation

## 2015-04-17 DIAGNOSIS — R1084 Generalized abdominal pain: Secondary | ICD-10-CM | POA: Insufficient documentation

## 2015-04-17 DIAGNOSIS — I10 Essential (primary) hypertension: Secondary | ICD-10-CM | POA: Diagnosis not present

## 2015-04-17 LAB — COMPREHENSIVE METABOLIC PANEL
ALT: 67 U/L — ABNORMAL HIGH (ref 14–54)
AST: 52 U/L — ABNORMAL HIGH (ref 15–41)
Albumin: 4.6 g/dL (ref 3.5–5.0)
Alkaline Phosphatase: 113 U/L (ref 38–126)
Anion gap: 11 (ref 5–15)
BUN: 11 mg/dL (ref 6–20)
CO2: 24 mmol/L (ref 22–32)
Calcium: 9.9 mg/dL (ref 8.9–10.3)
Chloride: 96 mmol/L — ABNORMAL LOW (ref 101–111)
Creatinine, Ser: 0.79 mg/dL (ref 0.44–1.00)
GFR calc Af Amer: >60 mL/min (ref >60)
GFR calc non Af Amer: >60 mL/min (ref >60)
Glucose, Bld: 443 mg/dL — ABNORMAL HIGH (ref 65–99)
Potassium: 3.3 mmol/L — ABNORMAL LOW (ref 3.5–5.1)
Sodium: 131 mmol/L — ABNORMAL LOW (ref 135–145)
Total Bilirubin: 1 mg/dL (ref 0.3–1.2)
Total Protein: 8.6 g/dL — ABNORMAL HIGH (ref 6.5–8.1)

## 2015-04-17 LAB — URINALYSIS, ROUTINE W REFLEX MICROSCOPIC
Bilirubin Urine: NEGATIVE
Glucose, UA: >1000 mg/dL — AB
Hgb urine dipstick: NEGATIVE
Ketones, ur: NEGATIVE mg/dL
Leukocytes, UA: NEGATIVE
Nitrite: NEGATIVE
Protein, ur: NEGATIVE mg/dL
Specific Gravity, Urine: 1.015 (ref 1.005–1.030)
pH: 5 (ref 5.0–8.0)

## 2015-04-17 LAB — URINE MICROSCOPIC-ADD ON: RBC / HPF: NONE SEEN RBC/hpf (ref 0–5)

## 2015-04-17 LAB — CBC
HCT: 38.8 % (ref 36.0–46.0)
Hemoglobin: 13.6 g/dL (ref 12.0–15.0)
MCH: 28.8 pg (ref 26.0–34.0)
MCHC: 35.1 g/dL (ref 30.0–36.0)
MCV: 82.2 fL (ref 78.0–100.0)
Platelets: 415 10*3/uL — ABNORMAL HIGH (ref 150–400)
RBC: 4.72 MIL/uL (ref 3.87–5.11)
RDW: 12.5 % (ref 11.5–15.5)
WBC: 9.8 10*3/uL (ref 4.0–10.5)

## 2015-04-17 NOTE — ED Notes (Signed)
Pt reports abdominal pain,urianry frequency,diarrhea for last several days. Pt reports generalized tenderness.

## 2015-04-17 NOTE — ED Notes (Signed)
Patient called 3 times for room placement.  No response.

## 2015-04-24 ENCOUNTER — Encounter (HOSPITAL_COMMUNITY): Payer: Self-pay | Admitting: Emergency Medicine

## 2015-04-24 ENCOUNTER — Emergency Department (HOSPITAL_COMMUNITY)
Admission: EM | Admit: 2015-04-24 | Discharge: 2015-04-24 | Disposition: A | Discharge status: Home / Self Care | Payer: Medicare Other | Attending: Emergency Medicine | Admitting: Emergency Medicine

## 2015-04-24 DIAGNOSIS — R197 Diarrhea, unspecified: Secondary | ICD-10-CM | POA: Insufficient documentation

## 2015-04-24 DIAGNOSIS — Z9851 Tubal ligation status: Secondary | ICD-10-CM | POA: Insufficient documentation

## 2015-04-24 DIAGNOSIS — Z862 Personal history of diseases of the blood and blood-forming organs and certain disorders involving the immune mechanism: Secondary | ICD-10-CM | POA: Diagnosis not present

## 2015-04-24 DIAGNOSIS — F319 Bipolar disorder, unspecified: Secondary | ICD-10-CM | POA: Diagnosis not present

## 2015-04-24 DIAGNOSIS — Z794 Long term (current) use of insulin: Secondary | ICD-10-CM | POA: Insufficient documentation

## 2015-04-24 DIAGNOSIS — I10 Essential (primary) hypertension: Secondary | ICD-10-CM | POA: Insufficient documentation

## 2015-04-24 DIAGNOSIS — R110 Nausea: Secondary | ICD-10-CM | POA: Diagnosis not present

## 2015-04-24 DIAGNOSIS — Z9049 Acquired absence of other specified parts of digestive tract: Secondary | ICD-10-CM | POA: Diagnosis not present

## 2015-04-24 DIAGNOSIS — Z79899 Other long term (current) drug therapy: Secondary | ICD-10-CM | POA: Insufficient documentation

## 2015-04-24 DIAGNOSIS — Z3202 Encounter for pregnancy test, result negative: Secondary | ICD-10-CM | POA: Diagnosis not present

## 2015-04-24 DIAGNOSIS — Z7982 Long term (current) use of aspirin: Secondary | ICD-10-CM | POA: Diagnosis not present

## 2015-04-24 DIAGNOSIS — E119 Type 2 diabetes mellitus without complications: Secondary | ICD-10-CM | POA: Insufficient documentation

## 2015-04-24 DIAGNOSIS — F259 Schizoaffective disorder, unspecified: Secondary | ICD-10-CM | POA: Insufficient documentation

## 2015-04-24 DIAGNOSIS — R1084 Generalized abdominal pain: Secondary | ICD-10-CM | POA: Diagnosis not present

## 2015-04-24 DIAGNOSIS — Z7984 Long term (current) use of oral hypoglycemic drugs: Secondary | ICD-10-CM | POA: Diagnosis not present

## 2015-04-24 DIAGNOSIS — F419 Anxiety disorder, unspecified: Secondary | ICD-10-CM | POA: Insufficient documentation

## 2015-04-24 DIAGNOSIS — R109 Unspecified abdominal pain: Secondary | ICD-10-CM | POA: Diagnosis present

## 2015-04-24 LAB — COMPREHENSIVE METABOLIC PANEL
ALT: 32 U/L (ref 14–54)
AST: 21 U/L (ref 15–41)
Albumin: 4.1 g/dL (ref 3.5–5.0)
Alkaline Phosphatase: 79 U/L (ref 38–126)
Anion gap: 9 (ref 5–15)
BUN: 16 mg/dL (ref 6–20)
CO2: 27 mmol/L (ref 22–32)
Calcium: 9.5 mg/dL (ref 8.9–10.3)
Chloride: 97 mmol/L — ABNORMAL LOW (ref 101–111)
Creatinine, Ser: 0.81 mg/dL (ref 0.44–1.00)
GFR calc Af Amer: >60 mL/min (ref >60)
GFR calc non Af Amer: >60 mL/min (ref >60)
Glucose, Bld: 323 mg/dL — ABNORMAL HIGH (ref 65–99)
Potassium: 3.8 mmol/L (ref 3.5–5.1)
Sodium: 133 mmol/L — ABNORMAL LOW (ref 135–145)
Total Bilirubin: 0.6 mg/dL (ref 0.3–1.2)
Total Protein: 7.7 g/dL (ref 6.5–8.1)

## 2015-04-24 LAB — URINALYSIS, ROUTINE W REFLEX MICROSCOPIC
Bilirubin Urine: NEGATIVE
Glucose, UA: >1000 mg/dL — AB
Hgb urine dipstick: NEGATIVE
Ketones, ur: NEGATIVE mg/dL
Nitrite: NEGATIVE
Protein, ur: NEGATIVE mg/dL
Specific Gravity, Urine: 1.02 (ref 1.005–1.030)
pH: 5.5 (ref 5.0–8.0)

## 2015-04-24 LAB — CBC
HCT: 33.2 % — ABNORMAL LOW (ref 36.0–46.0)
Hemoglobin: 11.4 g/dL — ABNORMAL LOW (ref 12.0–15.0)
MCH: 28.4 pg (ref 26.0–34.0)
MCHC: 34.3 g/dL (ref 30.0–36.0)
MCV: 82.6 fL (ref 78.0–100.0)
Platelets: 319 10*3/uL (ref 150–400)
RBC: 4.02 MIL/uL (ref 3.87–5.11)
RDW: 12.2 % (ref 11.5–15.5)
WBC: 7.9 10*3/uL (ref 4.0–10.5)

## 2015-04-24 LAB — LIPASE, BLOOD: Lipase: 30 U/L (ref 11–51)

## 2015-04-24 LAB — URINE MICROSCOPIC-ADD ON: RBC / HPF: NONE SEEN RBC/hpf (ref 0–5)

## 2015-04-24 LAB — PREGNANCY, URINE: Preg Test, Ur: NEGATIVE

## 2015-04-24 MED ORDER — ONDANSETRON HCL 4 MG/2ML IJ SOLN
4.0000 mg | Freq: Once | INTRAMUSCULAR | Status: AC
  Administered 2015-04-24: 4 mg via INTRAVENOUS
  Filled 2015-04-24: qty 2

## 2015-04-24 MED ORDER — DICYCLOMINE HCL 20 MG PO TABS: 20.0000 mg | ORAL_TABLET | Freq: Two times a day (BID) | ORAL | Status: DC

## 2015-04-24 MED ORDER — ONDANSETRON 4 MG PO TBDP: 4.0000 mg | ORAL_TABLET | Freq: Three times a day (TID) | ORAL | Status: DC | PRN

## 2015-04-24 MED ORDER — SODIUM CHLORIDE 0.9 % IV SOLN: Freq: Once | INTRAVENOUS | Status: DC

## 2015-04-24 MED ORDER — MORPHINE SULFATE (PF) 4 MG/ML IV SOLN
4.0000 mg | INTRAVENOUS | Status: DC | PRN
  Administered 2015-04-24: 4 mg via INTRAVENOUS
  Filled 2015-04-24: qty 1

## 2015-04-24 MED ORDER — DICYCLOMINE HCL 10 MG PO CAPS
10.0000 mg | ORAL_CAPSULE | Freq: Once | ORAL | Status: AC
Start: 2015-04-24 — End: 2015-04-24
  Administered 2015-04-24: 10 mg via ORAL
  Filled 2015-04-24: qty 1

## 2015-04-24 MED ORDER — SODIUM CHLORIDE 0.9 % IV BOLUS (SEPSIS)
1000.0000 mL | Freq: Once | INTRAVENOUS | Status: AC
  Administered 2015-04-24: 1000 mL via INTRAVENOUS

## 2015-04-24 NOTE — Discharge Instructions (Signed)
Stop taking Linzess until your symptoms resolved. Speak with your prescribing physician about restarting or changing dosage. Return to ER if any worsening or change in symptoms including fever, bloody stools, worsening pain, vomiting. Bentyl for cramps. Zofran for nausea.  Abdominal Pain, Adult Many things can cause belly (abdominal) pain. Most times, the belly pain is not dangerous. Many cases of belly pain can be watched and treated at home. HOME CARE   Do not take medicines that help you go poop (laxatives) unless told to by your doctor.  Only take medicine as told by your doctor.  Eat or drink as told by your doctor. Your doctor will tell you if you should be on a special diet. GET HELP IF:  You do not know what is causing your belly pain.  You have belly pain while you are sick to your stomach (nauseous) or have runny poop (diarrhea).  You have pain while you pee or poop.  Your belly pain wakes you up at night.  You have belly pain that gets worse or better when you eat.  You have belly pain that gets worse when you eat fatty foods.  You have a fever. GET HELP RIGHT AWAY IF:   The pain does not go away within 2 hours.  You keep throwing up (vomiting).  The pain changes and is only in the right or left part of the belly.  You have bloody or tarry looking poop. MAKE SURE YOU:   Understand these instructions.  Will watch your condition.  Will get help right away if you are not doing well or get worse.   This information is not intended to replace advice given to you by your health care provider. Make sure you discuss any questions you have with your health care provider.   Document Released: 10/29/2007 Document Revised: 06/02/2014 Document Reviewed: 01/19/2013 Elsevier Interactive Patient Education [employer].

## 2015-04-24 NOTE — ED Provider Notes (Signed)
CSN: [id number]     Arrival date & time 04/24/15  1652 History   First MD Initiated Contact with Patient 04/24/15 1726     Chief Complaint  Patient presents with  . Abdominal Pain      HPI  Patient presents for evaluation of abdominal pain and diarrhea. History of irritable bowel syndrome. Has been on lens S for about 6 weeks. For the last week his had frequent stools and frequent cramping abdominal pain. No fever. No localizing pain. No bloody stools. No pus. Nausea no vomiting. No dysuria but some urinary frequency. This is not unusual for her because "I'm diabetic". Her sugars typically run 200-300. She states "this is better since I been on the medication". Describes diabetes being diagnosed proximal 22 years ago. On by mouth meds as well as "short acting, and long-acting insulin"  Past Medical History  Diagnosis Date  . Diabetes mellitus   . Bipolar disorder ([place])   . Depression   . Anxiety   . Hypertension   . Anemia   . Schizoaffective disorder [facility])    Past Surgical History  Procedure Laterality Date  . Cesarean section    . Tubal ligation      X3  . Cholecystectomy  06/02/2012    Procedure: LAPAROSCOPIC CHOLECYSTECTOMY;  Surgeon: Jamesetta So, MD;  Location: [facility];  Service: General;  Laterality: N/A;  Attempted laparoscopic cholecystectomy  . Cholecystectomy  06/02/2012    Procedure: CHOLECYSTECTOMY;  Surgeon: Jamesetta So, MD;  Location: [facility];  Service: General;  Laterality: N/A;  converted to open at  0905  . Breast surgery     Family History  Problem Relation Age of Onset  . Stroke Other   . Diabetes Other   . Cancer Other   . Seizures Other   . Hypertension Mother   . Alcohol abuse Mother   . Hypertension Father   . Alcohol abuse Sister   . Alcohol abuse Maternal Aunt   . Alcohol abuse Paternal Aunt   . Alcohol abuse Maternal Grandfather   . Alcohol abuse Maternal Grandmother   . Alcohol abuse Cousin    Social History  Substance Use Topics  .  Smoking status: Never Smoker   . Smokeless tobacco: Never Used  . Alcohol Use: No   OB History    Gravida Para Term Preterm AB TAB SAB Ectopic Multiple Living   3 3 3       3      Review of Systems  Constitutional: Negative for fever, chills, diaphoresis, appetite change and fatigue.  HENT: Negative for mouth sores, sore throat and trouble swallowing.   Eyes: Negative for visual disturbance.  Respiratory: Negative for cough, chest tightness, shortness of breath and wheezing.   Cardiovascular: Negative for chest pain.  Gastrointestinal: Positive for nausea, abdominal pain and diarrhea. Negative for vomiting and abdominal distention.  Endocrine: Negative for polydipsia, polyphagia and polyuria.  Genitourinary: Negative for dysuria, frequency and hematuria.  Musculoskeletal: Negative for gait problem.  Skin: Negative for color change, pallor and rash.  Neurological: Negative for dizziness, syncope, light-headedness and headaches.  Hematological: Does not bruise/bleed easily.  Psychiatric/Behavioral: Negative for behavioral problems and confusion.      Allergies  Review of patient's allergies indicates no known allergies.  Home Medications   Prior to Admission medications   Medication Sig Start Date End Date Taking? Authorizing Provider  albuterol (PROVENTIL HFA;VENTOLIN HFA) 108 (90 BASE) MCG/ACT inhaler Inhale 2 puffs into the lungs every 6 (six)  hours as needed for wheezing.    Historical Provider, MD  aspirin EC 81 MG tablet Take 81 mg by mouth daily.    Historical Provider, MD  benztropine (COGENTIN) 1 MG tablet Take 1 tablet (1 mg total) by mouth at bedtime. 02/08/15 02/08/16  Cloria Spring, MD  Cholecalciferol 50000 UNITS TABS Take 1 tablet by mouth every 7 (seven) days. Takes on Sundays    Historical Provider, MD  dicyclomine (BENTYL) 20 MG tablet Take 1 tablet (20 mg total) by mouth 2 (two) times daily. 04/24/15   Tanna Furry, MD  DULoxetine (CYMBALTA) 60 MG capsule Take 1  capsule (60 mg total) by mouth daily at 6 PM. 02/08/15   Cloria Spring, MD  exenatide (BYETTA 10 MCG PEN) 10 MCG/0.04ML SOLN Inject 15 mcg into the skin 2 (two) times daily with a meal.     Historical Provider, MD  gabapentin (NEURONTIN) 300 MG capsule Take 300 mg by mouth 2 (two) times daily. 03/28/15   Historical Provider, MD  glimepiride (AMARYL) 4 MG tablet Take 4 mg by mouth daily.    Historical Provider, MD  HUMULIN R 500 UNIT/ML SOLN injection Inject 25 Units into the skin 3 (three) times daily.  04/20/13   Historical Provider, MD  levothyroxine (SYNTHROID, LEVOTHROID) 25 MCG tablet  04/15/15   Historical Provider, MD  Linaclotide (LINZESS) 145 MCG CAPS capsule Take 145 mcg by mouth daily.    Historical Provider, MD  lithium carbonate 300 MG capsule Take 2 capsules in the am and 3 at bedtime Patient taking differently: Take 600 mg by mouth 2 (two) times daily.  03/29/15   Cloria Spring, MD  metFORMIN (GLUCOPHAGE) 1000 MG tablet Take 1,000 mg by mouth 2 (two) times daily. 04/20/13   Historical Provider, MD  omeprazole (PRILOSEC) 20 MG capsule Take 20 mg by mouth daily.    Historical Provider, MD  ondansetron (ZOFRAN ODT) 4 MG disintegrating tablet Take 1 tablet (4 mg total) by mouth every 8 (eight) hours as needed for nausea. 04/24/15   Tanna Furry, MD  penicillin v potassium (VEETID) 500 MG tablet  04/10/15   Historical Provider, MD  pioglitazone (ACTOS) 15 MG tablet Take 15 mg by mouth daily.    Historical Provider, MD  risperiDONE (RISPERDAL) 3 MG tablet Take 1 tablet (3 mg total) by mouth at bedtime. 03/29/15 03/28/16  Cloria Spring, MD  simvastatin (ZOCOR) 20 MG tablet Take 20 mg by mouth daily at 6 PM.     Historical Provider, MD  TOUJEO SOLOSTAR 300 UNIT/ML [facility]  04/15/15   Historical Provider, MD  traZODone (DESYREL) 100 MG tablet Take 1 tablet (100 mg total) by mouth at bedtime. 02/08/15   Cloria Spring, MD   BP 143/96 mmHg  Pulse 91  Temp(Src) 98 F (36.7 C) (Oral)  Resp 16  Ht  5\' 2"  (1.575 m)  Wt 162 lb (73.483 kg)  BMI 29.62 kg/m2  SpO2 95%  LMP 01/23/2013 Physical Exam  Constitutional: She is oriented to person, place, and time. She appears well-developed and well-nourished. No distress.  HENT:  Head: Normocephalic.  Eyes: Conjunctivae are normal. Pupils are equal, round, and reactive to light. No scleral icterus.  Neck: Normal range of motion. Neck supple. No thyromegaly present.  Cardiovascular: Normal rate and regular rhythm.  Exam reveals no gallop and no friction rub.   No murmur heard. Pulmonary/Chest: Effort normal and breath sounds normal. No respiratory distress. She has no wheezes. She has no rales.  Abdominal: Soft. Bowel sounds are normal. She exhibits no distension. There is no tenderness. There is no rebound.  Soft benign abdomen. No guarding rebound or peritoneal irritation.  Musculoskeletal: Normal range of motion.  Neurological: She is alert and oriented to person, place, and time.  Skin: Skin is warm and dry. No rash noted.  Psychiatric: She has a normal mood and affect. Her behavior is normal.    ED Course  Procedures (including critical care time) Labs Review Labs Reviewed  COMPREHENSIVE METABOLIC PANEL - Abnormal; Notable for the following:    Sodium 133 (*)    Chloride 97 (*)    Glucose, Bld 323 (*)    All other components within normal limits  CBC - Abnormal; Notable for the following:    Hemoglobin 11.4 (*)    HCT 33.2 (*)    All other components within normal limits  URINALYSIS, ROUTINE W REFLEX MICROSCOPIC (NOT AT [facility]) - Abnormal; Notable for the following:    Glucose, UA >1000 (*)    Leukocytes, UA TRACE (*)    All other components within normal limits  URINE MICROSCOPIC-ADD ON - Abnormal; Notable for the following:    Squamous Epithelial / LPF 6-30 (*)    Bacteria, UA RARE (*)    All other components within normal limits  LIPASE, BLOOD  PREGNANCY, URINE    Imaging Review No results found. I have personally  reviewed and evaluated these images and lab results as part of my medical decision-making.   EKG Interpretation None      MDM   Final diagnoses:  Generalized abdominal pain    Benign exam. Reassuring labs. Sugar high but not acidotic. No leukocytosis. Very likely side effects of her Linzess with cramping pain and diarrhea. I asked her to hold this medication and to discuss restarting it with her prescriber. Clear liquids. When necessary Bentyl, when necessary Zofran. Return here with any worsening symptoms    Tanna Furry, MD 04/24/15 [phone]

## 2015-04-24 NOTE — ED Notes (Signed)
Pt c/o mid abdominal pain, n/d x 1 week.

## 2015-04-25 LAB — LITHIUM LEVEL: Lithium Lvl: 1 mEq/L (ref 0.80–1.40)

## 2015-05-03 ENCOUNTER — Ambulatory Visit (HOSPITAL_COMMUNITY): Payer: Self-pay | Admitting: Psychology

## 2015-05-04 ENCOUNTER — Ambulatory Visit (HOSPITAL_COMMUNITY): Payer: Self-pay | Admitting: Psychiatry

## 2015-05-16 ENCOUNTER — Other Ambulatory Visit: Payer: Self-pay | Admitting: Internal Medicine

## 2015-05-16 DIAGNOSIS — R109 Unspecified abdominal pain: Secondary | ICD-10-CM

## 2015-05-17 ENCOUNTER — Ambulatory Visit (HOSPITAL_COMMUNITY): Payer: Self-pay | Admitting: Psychology

## 2015-05-22 ENCOUNTER — Other Ambulatory Visit (HOSPITAL_COMMUNITY): Payer: Self-pay | Admitting: Psychiatry

## 2015-05-29 ENCOUNTER — Telehealth (HOSPITAL_COMMUNITY): Payer: Self-pay | Admitting: *Deleted

## 2015-05-29 NOTE — Telephone Encounter (Signed)
You may send it in and she needs appt

## 2015-05-29 NOTE — Telephone Encounter (Signed)
Pt pharmacy requesting refills for pt Trazodone. Pt medication was last filled 02-08-15 with 2 refills. Pt was last seen 03-29-15.

## 2015-05-30 ENCOUNTER — Ambulatory Visit
Admission: RE | Admit: 2015-05-30 | Discharge: 2015-05-30 | Disposition: A | Discharge status: Home / Self Care | Payer: Medicare Other | Source: Ambulatory Visit | Attending: Internal Medicine | Admitting: Internal Medicine

## 2015-05-30 ENCOUNTER — Telehealth (HOSPITAL_COMMUNITY): Payer: Self-pay | Admitting: *Deleted

## 2015-05-30 DIAGNOSIS — R109 Unspecified abdominal pain: Secondary | ICD-10-CM

## 2015-05-30 MED ORDER — TRAZODONE HCL 100 MG PO TABS: 100.0000 mg | ORAL_TABLET | Freq: Every day | ORAL | Status: DC

## 2015-05-30 NOTE — Telephone Encounter (Signed)
Per Dr. Harrington Challenger to send in 1 month worth of script to pt pharmacy due to their request

## 2015-05-30 NOTE — Telephone Encounter (Signed)
Medication sent to pt pharmacy 

## 2015-06-06 ENCOUNTER — Telehealth (HOSPITAL_COMMUNITY): Payer: Self-pay | Admitting: *Deleted

## 2015-06-06 NOTE — Telephone Encounter (Signed)
per pt she is having stomach issues and would like to resch appt. PEr pt she do not have any refills left. Informed pt that keeping her appt would be a good thing so she can get refills. Per pt, the issue is she do not have the $35 co-pay. Informed pt that office is required to let everyone know that copay is due at the time of visit. Per pt, she don't have the co-pay and what should she do. Informed pt unfortunately office is not allow to give financial advise. Per pt, she will just cancel appt and wait until the end of the month to get her medications refilled.

## 2015-06-07 ENCOUNTER — Encounter (HOSPITAL_COMMUNITY): Payer: Self-pay | Admitting: *Deleted

## 2015-06-07 ENCOUNTER — Emergency Department (HOSPITAL_COMMUNITY): Payer: Medicare Other

## 2015-06-07 ENCOUNTER — Ambulatory Visit (HOSPITAL_COMMUNITY): Payer: Self-pay | Admitting: Psychiatry

## 2015-06-07 ENCOUNTER — Emergency Department (HOSPITAL_COMMUNITY)
Admission: EM | Admit: 2015-06-07 | Discharge: 2015-06-07 | Disposition: A | Discharge status: Home / Self Care | Payer: Medicare Other | Attending: Emergency Medicine | Admitting: Emergency Medicine

## 2015-06-07 DIAGNOSIS — F319 Bipolar disorder, unspecified: Secondary | ICD-10-CM | POA: Insufficient documentation

## 2015-06-07 DIAGNOSIS — Z792 Long term (current) use of antibiotics: Secondary | ICD-10-CM | POA: Diagnosis not present

## 2015-06-07 DIAGNOSIS — F259 Schizoaffective disorder, unspecified: Secondary | ICD-10-CM | POA: Insufficient documentation

## 2015-06-07 DIAGNOSIS — E1165 Type 2 diabetes mellitus with hyperglycemia: Secondary | ICD-10-CM | POA: Diagnosis not present

## 2015-06-07 DIAGNOSIS — I10 Essential (primary) hypertension: Secondary | ICD-10-CM | POA: Insufficient documentation

## 2015-06-07 DIAGNOSIS — R197 Diarrhea, unspecified: Secondary | ICD-10-CM | POA: Diagnosis not present

## 2015-06-07 DIAGNOSIS — Z794 Long term (current) use of insulin: Secondary | ICD-10-CM | POA: Insufficient documentation

## 2015-06-07 DIAGNOSIS — Z862 Personal history of diseases of the blood and blood-forming organs and certain disorders involving the immune mechanism: Secondary | ICD-10-CM | POA: Diagnosis not present

## 2015-06-07 DIAGNOSIS — Z7984 Long term (current) use of oral hypoglycemic drugs: Secondary | ICD-10-CM | POA: Diagnosis not present

## 2015-06-07 DIAGNOSIS — R079 Chest pain, unspecified: Secondary | ICD-10-CM | POA: Diagnosis present

## 2015-06-07 DIAGNOSIS — F419 Anxiety disorder, unspecified: Secondary | ICD-10-CM | POA: Diagnosis not present

## 2015-06-07 DIAGNOSIS — Z79899 Other long term (current) drug therapy: Secondary | ICD-10-CM | POA: Diagnosis not present

## 2015-06-07 DIAGNOSIS — R112 Nausea with vomiting, unspecified: Secondary | ICD-10-CM | POA: Diagnosis not present

## 2015-06-07 DIAGNOSIS — R739 Hyperglycemia, unspecified: Secondary | ICD-10-CM

## 2015-06-07 DIAGNOSIS — Z7982 Long term (current) use of aspirin: Secondary | ICD-10-CM | POA: Diagnosis not present

## 2015-06-07 LAB — BASIC METABOLIC PANEL
Anion gap: 14 (ref 5–15)
BUN: 9 mg/dL (ref 6–20)
CO2: 24 mmol/L (ref 22–32)
Calcium: 10.5 mg/dL — ABNORMAL HIGH (ref 8.9–10.3)
Chloride: 91 mmol/L — ABNORMAL LOW (ref 101–111)
Creatinine, Ser: 1.06 mg/dL — ABNORMAL HIGH (ref 0.44–1.00)
GFR calc Af Amer: >60 mL/min (ref >60)
GFR calc non Af Amer: >60 mL/min (ref >60)
Glucose, Bld: 597 mg/dL (ref 65–99)
Potassium: 4.5 mmol/L (ref 3.5–5.1)
Sodium: 129 mmol/L — ABNORMAL LOW (ref 135–145)

## 2015-06-07 LAB — URINALYSIS, ROUTINE W REFLEX MICROSCOPIC
Bilirubin Urine: NEGATIVE
Glucose, UA: >1000 mg/dL — AB
Hgb urine dipstick: NEGATIVE
Ketones, ur: NEGATIVE mg/dL
Nitrite: NEGATIVE
Protein, ur: NEGATIVE mg/dL
Specific Gravity, Urine: 1.01 (ref 1.005–1.030)
pH: 5.5 (ref 5.0–8.0)

## 2015-06-07 LAB — URINE MICROSCOPIC-ADD ON: RBC / HPF: NONE SEEN RBC/hpf (ref 0–5)

## 2015-06-07 LAB — CBG MONITORING, ED: Glucose-Capillary: 458 mg/dL — ABNORMAL HIGH (ref 65–99)

## 2015-06-07 LAB — TROPONIN I: Troponin I: <0.03 ng/mL (ref <0.031)

## 2015-06-07 LAB — CBC
HCT: 39.3 % (ref 36.0–46.0)
Hemoglobin: 13.6 g/dL (ref 12.0–15.0)
MCH: 28.6 pg (ref 26.0–34.0)
MCHC: 34.6 g/dL (ref 30.0–36.0)
MCV: 82.7 fL (ref 78.0–100.0)
Platelets: 310 10*3/uL (ref 150–400)
RBC: 4.75 MIL/uL (ref 3.87–5.11)
RDW: 12.4 % (ref 11.5–15.5)
WBC: 9.5 10*3/uL (ref 4.0–10.5)

## 2015-06-07 MED ORDER — INSULIN ASPART 100 UNIT/ML ~~LOC~~ SOLN
10.0000 [IU] | Freq: Once | SUBCUTANEOUS | Status: AC
  Administered 2015-06-07: 10 [IU] via INTRAVENOUS
  Filled 2015-06-07: qty 1

## 2015-06-07 MED ORDER — SODIUM CHLORIDE 0.9 % IV BOLUS (SEPSIS)
1000.0000 mL | Freq: Once | INTRAVENOUS | Status: AC
  Administered 2015-06-07: 1000 mL via INTRAVENOUS

## 2015-06-07 MED ORDER — SODIUM CHLORIDE 0.9 % IV SOLN
INTRAVENOUS | Status: DC
  Administered 2015-06-07: 15:00:00 via INTRAVENOUS

## 2015-06-07 NOTE — ED Notes (Signed)
Blood glucose 597, dr. Rogene Houston notified.

## 2015-06-07 NOTE — ED Notes (Signed)
Patient with no complaints at this time. Respirations even and unlabored. Skin warm/dry. Discharge instructions reviewed with patient at this time. Patient given opportunity to voice concerns/ask questions. IV removed per policy and band-aid applied to site. Patient discharged at this time and left Emergency Department with steady gait.  

## 2015-06-07 NOTE — ED Provider Notes (Addendum)
CSN: [id number]     Arrival date & time 06/07/15  1204 History   First MD Initiated Contact with Patient 06/07/15 1420     Chief Complaint  Patient presents with  . Chest Pain     (Consider location/radiation/quality/duration/timing/severity/associated sxs/prior Treatment) Patient is a 45 y.o. female presenting with chest pain. The history is provided by the patient.  Chest Pain Associated symptoms: nausea and vomiting   Associated symptoms: no abdominal pain, no back pain, no fever, no headache and no shortness of breath    patient presents with concerns for left-sided chest pain been present for 2 days intermittently lasting only seconds sharp in nature radiating into left arm. Associated with 2 episodes of vomiting yesterday and some loose bowel movements. Nausea today but no vomiting. No shortness of breath. Patient states also that she's been urinating more frequently and thinks her blood sugars are elevated.  Past Medical History  Diagnosis Date  . Diabetes mellitus   . Bipolar disorder ([place])   . Depression   . Anxiety   . Hypertension   . Anemia   . Schizoaffective disorder [facility])    Past Surgical History  Procedure Laterality Date  . Cesarean section    . Tubal ligation      X3  . Cholecystectomy  06/02/2012    Procedure: LAPAROSCOPIC CHOLECYSTECTOMY;  Surgeon: Jamesetta So, MD;  Location: [facility];  Service: General;  Laterality: N/A;  Attempted laparoscopic cholecystectomy  . Cholecystectomy  06/02/2012    Procedure: CHOLECYSTECTOMY;  Surgeon: Jamesetta So, MD;  Location: [facility];  Service: General;  Laterality: N/A;  converted to open at  0905  . Breast surgery     Family History  Problem Relation Age of Onset  . Stroke Other   . Diabetes Other   . Cancer Other   . Seizures Other   . Hypertension Mother   . Alcohol abuse Mother   . Hypertension Father   . Alcohol abuse Sister   . Alcohol abuse Maternal Aunt   . Alcohol abuse Paternal Aunt   . Alcohol abuse  Maternal Grandfather   . Alcohol abuse Maternal Grandmother   . Alcohol abuse Cousin    Social History  Substance Use Topics  . Smoking status: Never Smoker   . Smokeless tobacco: Never Used  . Alcohol Use: No   OB History    Gravida Para Term Preterm AB TAB SAB Ectopic Multiple Living   3 3 3       3      Review of Systems  Constitutional: Negative for fever.  HENT: Negative for congestion.   Eyes: Negative for visual disturbance.  Respiratory: Negative for shortness of breath.   Cardiovascular: Positive for chest pain.  Gastrointestinal: Positive for nausea, vomiting and diarrhea. Negative for abdominal pain.  Endocrine: Positive for polyuria.  Genitourinary: Positive for frequency.  Musculoskeletal: Negative for back pain.  Skin: Negative for rash.  Neurological: Negative for headaches.  Hematological: Does not bruise/bleed easily.  Psychiatric/Behavioral: Negative for confusion.      Allergies  Review of patient's allergies indicates no known allergies.  Home Medications   Prior to Admission medications   Medication Sig Start Date End Date Taking? Authorizing Provider  albuterol (PROVENTIL HFA;VENTOLIN HFA) 108 (90 BASE) MCG/ACT inhaler Inhale 2 puffs into the lungs every 6 (six) hours as needed for wheezing.   Yes Historical Provider, MD  aspirin EC 81 MG tablet Take 81 mg by mouth daily.   Yes Historical Provider,  MD  atorvastatin (LIPITOR) 10 MG tablet Take 1 tablet by mouth daily. 05/04/15  Yes Historical Provider, MD  benztropine (COGENTIN) 1 MG tablet Take 1 tablet (1 mg total) by mouth at bedtime. 02/08/15 02/08/16 Yes Cloria Spring, MD  Cholecalciferol 50000 UNITS TABS Take 1 tablet by mouth every 7 (seven) days. Takes on Sundays   Yes Historical Provider, MD  DULoxetine (CYMBALTA) 60 MG capsule Take 1 capsule (60 mg total) by mouth daily at 6 PM. 02/08/15  Yes Cloria Spring, MD  exenatide (BYETTA 10 MCG PEN) 10 MCG/0.04ML SOLN Inject 15 mcg into the skin every  evening.    Yes Historical Provider, MD  gabapentin (NEURONTIN) 300 MG capsule Take 300 mg by mouth 2 (two) times daily. 03/28/15  Yes Historical Provider, MD  glimepiride (AMARYL) 4 MG tablet Take 4 mg by mouth daily.   Yes Historical Provider, MD  insulin aspart (NOVOLOG) 100 UNIT/ML injection Inject 15 Units into the skin 3 (three) times daily before meals.   Yes Historical Provider, MD  levothyroxine (SYNTHROID, LEVOTHROID) 25 MCG tablet Take 25 mcg by mouth daily before breakfast.  04/15/15  Yes Historical Provider, MD  Linaclotide (LINZESS) 145 MCG CAPS capsule Take 145 mcg by mouth daily.   Yes Historical Provider, MD  lisinopril (PRINIVIL,ZESTRIL) 10 MG tablet Take 1 tablet by mouth daily. 05/04/15  Yes Historical Provider, MD  lithium carbonate 300 MG capsule Take 2 capsules in the am and 3 at bedtime Patient taking differently: Take 600-900 mg by mouth See admin instructions. Take 2 capsules in the morning and 3 capsules in the evening 03/29/15  Yes Cloria Spring, MD  metFORMIN (GLUCOPHAGE) 1000 MG tablet Take 1,000 mg by mouth 2 (two) times daily. 04/20/13  Yes Historical Provider, MD  methocarbamol (ROBAXIN) 500 MG tablet Take 1 tablet by mouth. 05/04/15  Yes Historical Provider, MD  omeprazole (PRILOSEC) 20 MG capsule Take 20 mg by mouth daily.   Yes Historical Provider, MD  ondansetron (ZOFRAN ODT) 4 MG disintegrating tablet Take 1 tablet (4 mg total) by mouth every 8 (eight) hours as needed for nausea. 04/24/15  Yes Tanna Furry, MD  penicillin v potassium (VEETID) 500 MG tablet Take 500 mg by mouth daily.  04/10/15  Yes Historical Provider, MD  pioglitazone (ACTOS) 15 MG tablet Take 15 mg by mouth daily.   Yes Historical Provider, MD  risperiDONE (RISPERDAL) 3 MG tablet Take 1 tablet (3 mg total) by mouth at bedtime. 03/29/15 03/28/16 Yes Cloria Spring, MD  TOUJEO SOLOSTAR 300 UNIT/ML SOPN Inject 25 Units into the skin at bedtime.  04/15/15  Yes Historical Provider, MD  traMADol (ULTRAM)  50 MG tablet Take 1 tablet by mouth daily as needed. 05/04/15  Yes Historical Provider, MD  traZODone (DESYREL) 100 MG tablet Take 1 tablet (100 mg total) by mouth at bedtime. 05/30/15  Yes Cloria Spring, MD  dicyclomine (BENTYL) 20 MG tablet Take 1 tablet (20 mg total) by mouth 2 (two) times daily. 04/24/15   Tanna Furry, MD   BP 131/91 mmHg  Pulse 106  Temp(Src) 99.6 F (37.6 C) (Tympanic)  Resp 17  Ht 5\' 2"  (1.575 m)  Wt 74.39 kg  BMI 29.99 kg/m2  SpO2 98%  LMP 01/23/2013 Physical Exam  Constitutional: She is oriented to person, place, and time. She appears well-developed and well-nourished. No distress.  HENT:  Head: Normocephalic and atraumatic.  Mouth/Throat: Oropharynx is clear and moist.  Eyes: Conjunctivae and EOM are normal. Pupils  are equal, round, and reactive to light.  Neck: Normal range of motion. Neck supple.  Cardiovascular: Normal rate, regular rhythm and normal heart sounds.   No murmur heard. Pulmonary/Chest: Effort normal and breath sounds normal. No respiratory distress.  Abdominal: Soft.  Musculoskeletal: Normal range of motion. She exhibits no edema.  Neurological: She is alert and oriented to person, place, and time. No cranial nerve deficit. She exhibits normal muscle tone. Coordination normal.  Skin: Skin is warm. No rash noted.  Nursing note and vitals reviewed.   ED Course  Procedures (including critical care time) Labs Review Labs Reviewed  BASIC METABOLIC PANEL - Abnormal; Notable for the following:    Sodium 129 (*)    Chloride 91 (*)    Glucose, Bld 597 (*)    Creatinine, Ser 1.06 (*)    Calcium 10.5 (*)    All other components within normal limits  URINALYSIS, ROUTINE W REFLEX MICROSCOPIC (NOT AT [facility]) - Abnormal; Notable for the following:    Color, Urine STRAW (*)    Glucose, UA >1000 (*)    Leukocytes, UA TRACE (*)    All other components within normal limits  URINE MICROSCOPIC-ADD ON - Abnormal; Notable for the following:    Squamous  Epithelial / LPF 6-30 (*)    Bacteria, UA FEW (*)    All other components within normal limits  CBG MONITORING, ED - Abnormal; Notable for the following:    Glucose-Capillary 458 (*)    All other components within normal limits  CBC  TROPONIN I   Results for orders placed or performed during the hospital encounter of [id number]  Basic metabolic panel  Result Value Ref Range   Sodium 129 (L) 135 - 145 mmol/L   Potassium 4.5 3.5 - 5.1 mmol/L   Chloride 91 (L) 101 - 111 mmol/L   CO2 24 22 - 32 mmol/L   Glucose, Bld 597 (HH) 65 - 99 mg/dL   BUN 9 6 - 20 mg/dL   Creatinine, Ser 1.06 (H) 0.44 - 1.00 mg/dL   Calcium 10.5 (H) 8.9 - 10.3 mg/dL   GFR calc non Af Amer >60 >60 mL/min   GFR calc Af Amer >60 >60 mL/min   Anion gap 14 5 - 15  CBC  Result Value Ref Range   WBC 9.5 4.0 - 10.5 K/uL   RBC 4.75 3.87 - 5.11 MIL/uL   Hemoglobin 13.6 12.0 - 15.0 g/dL   HCT 39.3 36.0 - 46.0 %   MCV 82.7 78.0 - 100.0 fL   MCH 28.6 26.0 - 34.0 pg   MCHC 34.6 30.0 - 36.0 g/dL   RDW 12.4 11.5 - 15.5 %   Platelets 310 150 - 400 K/uL  Troponin I  Result Value Ref Range   Troponin I <0.03 <0.031 ng/mL  Urinalysis, Routine w reflex microscopic (not at [facility])  Result Value Ref Range   Color, Urine STRAW (A) YELLOW   APPearance CLEAR CLEAR   Specific Gravity, Urine 1.010 1.005 - 1.030   pH 5.5 5.0 - 8.0   Glucose, UA >1000 (A) NEGATIVE mg/dL   Hgb urine dipstick NEGATIVE NEGATIVE   Bilirubin Urine NEGATIVE NEGATIVE   Ketones, ur NEGATIVE NEGATIVE mg/dL   Protein, ur NEGATIVE NEGATIVE mg/dL   Nitrite NEGATIVE NEGATIVE   Leukocytes, UA TRACE (A) NEGATIVE  Urine microscopic-add on  Result Value Ref Range   Squamous Epithelial / LPF 6-30 (A) NONE SEEN   WBC, UA 6-30 0 - 5 WBC/hpf  RBC / HPF NONE SEEN 0 - 5 RBC/hpf   Bacteria, UA FEW (A) NONE SEEN  POC CBG, ED  Result Value Ref Range   Glucose-Capillary 458 (H) 65 - 99 mg/dL     Imaging Review Dg Chest 2 View  06/07/2015  CLINICAL DATA:  Left  side chest pain intermittent for 2 days EXAM: CHEST  2 VIEW COMPARISON:  04/24/2014 FINDINGS: Cardiomediastinal silhouette is stable. No acute infiltrate or pleural effusion. No pulmonary edema. Mild degenerative changes lower thoracic spine. IMPRESSION: No active cardiopulmonary disease. Electronically Signed   By: Lahoma Crocker M.D.   On: 06/07/2015 13:09   I have personally reviewed and evaluated these images and lab results as part of my medical decision-making.   EKG Interpretation   Date/Time:  Thursday June 07 2015 12:13:22 EST Ventricular Rate:  99 PR Interval:  134 QRS Duration: 86 QT Interval:  322 QTC Calculation: 413 R Axis:   12 Text Interpretation:  Normal sinus rhythm Normal ECG Confirmed by  Melton Walls  MD, [place] Wachter ([id number]) on 06/07/2015 2:27:57 PM      MDM   Final diagnoses:  Chest pain, unspecified chest pain type  Hyperglycemia    Patient presented with a complaint of left-sided chest pain radiating to left arm intermittent over the last 2 days only lasting seconds at a time. Associated with nausea yesterday and today vomiting 2 yesterday and some loose bowel movements yesterday. No shortness of breath. Patient also had increase urinary frequency. Workup shows marked elevation in her blood sugars. Patient's blood sugars are frequently elevated when she is seen here. However today it's above the 500th. No evidence of metabolic acidosis. Will treat with IV fluids IV insulin improve the blood sugar some then patient can probably be discharged home for follow-up. She has all her current diabetic medications. She is followed by Dr. Maudie Mercury as her primary.  Workup for the chest pain without any acute findings EKG without acute changes chest x-ray negative for pneumonia pneumothorax or pulmonary edema. And as mentioned troponin is negative.   Fredia Sorrow, MD 06/07/15 1530  Patient's blood sugar on moving in the right direction now mid 400s. Again no evidence of diabetic  ketoacidosis. Patient has contacted her primary care provider and he will provide her with samples for her diabetic medicine. Patient states that she still has doses at home to hold her for a couple days. She will get her refills tomorrow. Patient's chest pain of no acute concerns as discussed above.  Fredia Sorrow, MD 06/07/15 [place], MD 06/07/15 1536

## 2015-06-07 NOTE — Discharge Instructions (Signed)
Pick up your refills on your diabetic medicines tomorrow as per Dr. Maudie Mercury has arranged. Return for any new or worse symptoms. Return for any chest pain lasting 15 or 20 minutes or longer.

## 2015-06-07 NOTE — ED Notes (Signed)
Pt states pain to left chest, described intermittent and sharp, lasting only a few seconds at a time. Pain began yesterday. Also states vomited x 2 yesterday and diarrhea.

## 2015-06-15 ENCOUNTER — Ambulatory Visit (HOSPITAL_COMMUNITY): Payer: Self-pay | Admitting: Psychology

## 2015-06-25 ENCOUNTER — Ambulatory Visit (INDEPENDENT_AMBULATORY_CARE_PROVIDER_SITE_OTHER): Payer: Medicare Other | Admitting: Psychiatry

## 2015-06-25 ENCOUNTER — Encounter (HOSPITAL_COMMUNITY): Payer: Self-pay | Admitting: Psychiatry

## 2015-06-25 VITALS — BP 142/99 | HR 84 | Ht 62.0 in | Wt 160.0 lb

## 2015-06-25 DIAGNOSIS — F251 Schizoaffective disorder, depressive type: Secondary | ICD-10-CM

## 2015-06-25 MED ORDER — DULOXETINE HCL 60 MG PO CPEP: 60.0000 mg | ORAL_CAPSULE | Freq: Every day | ORAL | Status: DC

## 2015-06-25 MED ORDER — TRAZODONE HCL 100 MG PO TABS: 200.0000 mg | ORAL_TABLET | Freq: Every day | ORAL | Status: DC

## 2015-06-25 MED ORDER — BENZTROPINE MESYLATE 1 MG PO TABS: 1.0000 mg | ORAL_TABLET | Freq: Every day | ORAL | Status: DC

## 2015-06-25 MED ORDER — RISPERIDONE 3 MG PO TABS: 3.0000 mg | ORAL_TABLET | Freq: Every day | ORAL | Status: DC

## 2015-06-25 MED ORDER — LITHIUM CARBONATE 300 MG PO CAPS: ORAL_CAPSULE | ORAL | Status: DC
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
## 2015-07-05 ENCOUNTER — Ambulatory Visit (HOSPITAL_COMMUNITY): Payer: Self-pay | Admitting: Psychology

## 2015-07-05 ENCOUNTER — Encounter (HOSPITAL_COMMUNITY): Payer: Self-pay | Admitting: Psychology

## 2015-07-06 ENCOUNTER — Ambulatory Visit (INDEPENDENT_AMBULATORY_CARE_PROVIDER_SITE_OTHER): Payer: Medicare Other | Admitting: Psychiatry

## 2015-07-06 ENCOUNTER — Encounter (HOSPITAL_COMMUNITY): Payer: Self-pay | Admitting: Psychiatry

## 2015-07-06 VITALS — BP 123/84 | HR 103 | Ht 62.0 in | Wt 154.6 lb

## 2015-07-06 DIAGNOSIS — F251 Schizoaffective disorder, depressive type: Secondary | ICD-10-CM

## 2015-07-06 MED ORDER — RISPERIDONE 3 MG PO TABS: 3.0000 mg | ORAL_TABLET | Freq: Every day | ORAL | Status: DC

## 2015-07-06 MED ORDER — TRAZODONE HCL 100 MG PO TABS: 200.0000 mg | ORAL_TABLET | Freq: Every day | ORAL | Status: DC

## 2015-07-06 MED ORDER — LITHIUM CARBONATE 300 MG PO CAPS: ORAL_CAPSULE | ORAL | Status: DC

## 2015-07-06 MED ORDER — DULOXETINE HCL 60 MG PO CPEP: 60.0000 mg | ORAL_CAPSULE | Freq: Every day | ORAL | Status: DC

## 2015-07-06 MED ORDER — BENZTROPINE MESYLATE 1 MG PO TABS: 1.0000 mg | ORAL_TABLET | Freq: Every day | ORAL | Status: DC
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
## 2015-07-20 ENCOUNTER — Telehealth (HOSPITAL_COMMUNITY): Payer: Self-pay | Admitting: *Deleted

## 2015-07-20 NOTE — Telephone Encounter (Signed)
lmtcb number provided 

## 2015-07-20 NOTE — Telephone Encounter (Signed)
phone call from patient, wants to know if you can give her something for the shakes.    She shakes a lot.   She is seeing her primary care doctor today.

## 2015-07-20 NOTE — Telephone Encounter (Signed)
She is already on cogentin.I would need to see her to see what type of shakes she is having

## 2015-07-23 NOTE — Telephone Encounter (Signed)
Acarbose is for diabetes not tremor or nausea. She can come in if she is still anxious

## 2015-07-23 NOTE — Telephone Encounter (Signed)
Pt called back with medication. Per pt, the medication that her PCP gaved her is Acarbose. Per pt, her PCP told her that it was for the nausea and shakes. Pt would like to know if Dr. Harrington Challenger would still like to have her come into office. Per pt, PCP gaved her this medication on Friday and she started it on Saturday. Pt number is [phone]

## 2015-07-23 NOTE — Telephone Encounter (Signed)
Called pt and scheduled appt for f/u.../or

## 2015-07-23 NOTE — Telephone Encounter (Signed)
lmtcb with husband for pt to call office back and he showed understanding.

## 2015-07-23 NOTE — Telephone Encounter (Signed)
Called pt to sch sooner appt. Asked pt if she is still having the shakes from Friday and she stated that she went to her PCP doctor on Friday 24, February. Per pt, she is still having a little shakes but do not remember the name of the medication. Per pt she was having nausea as well when she went to her PCP so the medication is for nausea. Per pt she will call office back when she gets home with the medication name.

## 2015-07-24 ENCOUNTER — Encounter (INDEPENDENT_AMBULATORY_CARE_PROVIDER_SITE_OTHER): Payer: Self-pay | Admitting: *Deleted

## 2015-07-31 ENCOUNTER — Ambulatory Visit (HOSPITAL_COMMUNITY): Payer: Self-pay | Admitting: Psychiatry

## 2015-08-01 ENCOUNTER — Emergency Department (HOSPITAL_COMMUNITY)
Admission: EM | Admit: 2015-08-01 | Discharge: 2015-08-01 | Disposition: A | Discharge status: Home / Self Care | Payer: Medicare Other

## 2015-08-01 NOTE — ED Notes (Signed)
No answer in waiting area.

## 2015-08-02 ENCOUNTER — Emergency Department (HOSPITAL_COMMUNITY)
Admission: EM | Admit: 2015-08-02 | Discharge: 2015-08-02 | Disposition: A | Discharge status: Home / Self Care | Payer: Medicare Other | Attending: Emergency Medicine | Admitting: Emergency Medicine

## 2015-08-02 ENCOUNTER — Encounter (HOSPITAL_COMMUNITY): Payer: Self-pay

## 2015-08-02 ENCOUNTER — Other Ambulatory Visit: Payer: Self-pay

## 2015-08-02 ENCOUNTER — Emergency Department (HOSPITAL_COMMUNITY): Payer: Medicare Other

## 2015-08-02 DIAGNOSIS — E1165 Type 2 diabetes mellitus with hyperglycemia: Secondary | ICD-10-CM | POA: Insufficient documentation

## 2015-08-02 DIAGNOSIS — I10 Essential (primary) hypertension: Secondary | ICD-10-CM | POA: Diagnosis not present

## 2015-08-02 DIAGNOSIS — Z7982 Long term (current) use of aspirin: Secondary | ICD-10-CM | POA: Diagnosis not present

## 2015-08-02 DIAGNOSIS — Z794 Long term (current) use of insulin: Secondary | ICD-10-CM | POA: Diagnosis not present

## 2015-08-02 DIAGNOSIS — R202 Paresthesia of skin: Secondary | ICD-10-CM | POA: Insufficient documentation

## 2015-08-02 DIAGNOSIS — Z7984 Long term (current) use of oral hypoglycemic drugs: Secondary | ICD-10-CM | POA: Insufficient documentation

## 2015-08-02 DIAGNOSIS — R200 Anesthesia of skin: Secondary | ICD-10-CM | POA: Diagnosis present

## 2015-08-02 DIAGNOSIS — R739 Hyperglycemia, unspecified: Secondary | ICD-10-CM

## 2015-08-02 DIAGNOSIS — F319 Bipolar disorder, unspecified: Secondary | ICD-10-CM | POA: Diagnosis not present

## 2015-08-02 DIAGNOSIS — Z79899 Other long term (current) drug therapy: Secondary | ICD-10-CM | POA: Diagnosis not present

## 2015-08-02 DIAGNOSIS — M25511 Pain in right shoulder: Secondary | ICD-10-CM | POA: Diagnosis not present

## 2015-08-02 LAB — CBC WITH DIFFERENTIAL/PLATELET
Basophils Absolute: 0 10*3/uL (ref 0.0–0.1)
Basophils Relative: 0 %
Eosinophils Absolute: 0.2 10*3/uL (ref 0.0–0.7)
Eosinophils Relative: 3 %
HCT: 35.1 % — ABNORMAL LOW (ref 36.0–46.0)
Hemoglobin: 12 g/dL (ref 12.0–15.0)
Lymphocytes Relative: 30 %
Lymphs Abs: 2.8 10*3/uL (ref 0.7–4.0)
MCH: 28.3 pg (ref 26.0–34.0)
MCHC: 34.2 g/dL (ref 30.0–36.0)
MCV: 82.8 fL (ref 78.0–100.0)
Monocytes Absolute: 0.5 10*3/uL (ref 0.1–1.0)
Monocytes Relative: 6 %
Neutro Abs: 5.6 10*3/uL (ref 1.7–7.7)
Neutrophils Relative %: 61 %
Platelets: 327 10*3/uL (ref 150–400)
RBC: 4.24 MIL/uL (ref 3.87–5.11)
RDW: 12.1 % (ref 11.5–15.5)
WBC: 9.2 10*3/uL (ref 4.0–10.5)

## 2015-08-02 LAB — BASIC METABOLIC PANEL
Anion gap: 6 (ref 5–15)
BUN: 7 mg/dL (ref 6–20)
CO2: 29 mmol/L (ref 22–32)
Calcium: 9.7 mg/dL (ref 8.9–10.3)
Chloride: 95 mmol/L — ABNORMAL LOW (ref 101–111)
Creatinine, Ser: 0.83 mg/dL (ref 0.44–1.00)
GFR calc Af Amer: >60 mL/min (ref >60)
GFR calc non Af Amer: >60 mL/min (ref >60)
Glucose, Bld: 525 mg/dL — ABNORMAL HIGH (ref 65–99)
Potassium: 4 mmol/L (ref 3.5–5.1)
Sodium: 130 mmol/L — ABNORMAL LOW (ref 135–145)

## 2015-08-02 LAB — CBG MONITORING, ED: Glucose-Capillary: 517 mg/dL — ABNORMAL HIGH (ref 65–99)

## 2015-08-02 MED ORDER — INSULIN ASPART 100 UNIT/ML ~~LOC~~ SOLN
10.0000 [IU] | Freq: Once | SUBCUTANEOUS | Status: AC
  Administered 2015-08-02: 10 [IU] via SUBCUTANEOUS
  Filled 2015-08-02: qty 1

## 2015-08-02 MED ORDER — KETOROLAC TROMETHAMINE 30 MG/ML IJ SOLN
30.0000 mg | Freq: Once | INTRAMUSCULAR | Status: AC
  Administered 2015-08-02: 30 mg via INTRAVENOUS
  Filled 2015-08-02: qty 1

## 2015-08-02 MED ORDER — CELECOXIB 200 MG PO CAPS: 200.0000 mg | ORAL_CAPSULE | Freq: Two times a day (BID) | ORAL | Status: DC

## 2015-08-02 NOTE — ED Notes (Signed)
Pt repors since Monday she has had pain in her right shoulder and now with tingling and numbness down the arm to her hand.  Pt states it seems to be worse when she tries to sleep.

## 2015-08-02 NOTE — Discharge Instructions (Signed)
Shoulder Pain The shoulder is the joint that connects your arms to your body. The bones that form the shoulder joint include the upper arm bone (humerus), the shoulder blade (scapula), and the collarbone (clavicle). The top of the humerus is shaped like a ball and fits into a rather flat socket on the scapula (glenoid cavity). A combination of muscles and strong, fibrous tissues that connect muscles to bones (tendons) support your shoulder joint and hold the ball in the socket. Small, fluid-filled sacs (bursae) are located in different areas of the joint. They act as cushions between the bones and the overlying soft tissues and help reduce friction between the gliding tendons and the bone as you move your arm. Your shoulder joint allows a wide range of motion in your arm. This range of motion allows you to do things like scratch your back or throw a ball. However, this range of motion also makes your shoulder more prone to pain from overuse and injury. Causes of shoulder pain can originate from both injury and overuse and usually can be grouped in the following four categories:  Redness, swelling, and pain (inflammation) of the tendon (tendinitis) or the bursae (bursitis).  Instability, such as a dislocation of the joint.  Inflammation of the joint (arthritis).  Broken bone (fracture). HOME CARE INSTRUCTIONS   Apply ice to the sore area.  Put ice in a plastic bag.  Place a towel between your skin and the bag.  Leave the ice on for 15-20 minutes, 3-4 times per day for the first 2 days, or as directed by your health care provider.  Stop using cold packs if they do not help with the pain.  If you have a shoulder sling or immobilizer, wear it as long as your caregiver instructs. Only remove it to shower or bathe. Move your arm as little as possible, but keep your hand moving to prevent swelling.  Squeeze a soft ball or foam pad as much as possible to help prevent swelling.  Only take  over-the-counter or prescription medicines for pain, discomfort, or fever as directed by your caregiver. SEEK MEDICAL CARE IF:   Your shoulder pain increases, or new pain develops in your arm, hand, or fingers.  Your hand or fingers become cold and numb.  Your pain is not relieved with medicines. SEEK IMMEDIATE MEDICAL CARE IF:   Your arm, hand, or fingers are numb or tingling.  Your arm, hand, or fingers are significantly swollen or turn white or blue. MAKE SURE YOU:   Understand these instructions.  Will watch your condition.  Will get help right away if you are not doing well or get worse.   This information is not intended to replace advice given to you by your health care provider. Make sure you discuss any questions you have with your health care provider.   Document Released: 02/19/2005 Document Revised: 06/02/2014 Document Reviewed: 09/04/2014 Elsevier Interactive Patient Education 2016 [employer]  Paresthesia Paresthesia is an abnormal burning or prickling sensation. This sensation is generally felt in the hands, arms, legs, or feet. However, it may occur in any part of the body. Usually, it is not painful. The feeling may be described as:  Tingling or numbness.  Pins and needles.  Skin crawling.  Buzzing.  Limbs falling asleep.  Itching. Most people experience temporary (transient) paresthesia at some time in their lives. Paresthesia may occur when you breathe too quickly (hyperventilation). It can also occur without any apparent cause. Commonly, paresthesia occurs when  pressure is placed on a nerve. The sensation quickly goes away after the pressure is removed. For some people, however, paresthesia is a long-lasting (chronic) condition that is caused by an underlying disorder. If you continue to have paresthesia, you may need further medical evaluation. HOME CARE INSTRUCTIONS Watch your condition for any changes. Taking the following actions may help to lessen  any discomfort that you are feeling:  Avoid drinking alcohol.  Try acupuncture or massage to help relieve your symptoms.  Keep all follow-up visits as directed by your health care provider. This is important. SEEK MEDICAL CARE IF:  You continue to have episodes of paresthesia.  Your burning or prickling feeling gets worse when you walk.  You have pain, cramps, or dizziness.  You develop a rash. SEEK IMMEDIATE MEDICAL CARE IF:  You feel weak.  You have trouble walking or moving.  You have problems with speech, understanding, or vision.  You feel confused.  You cannot control your bladder or bowel movements.  You have numbness after an injury.  You faint.   This information is not intended to replace advice given to you by your health care provider. Make sure you discuss any questions you have with your health care provider.   Document Released: 05/02/2002 Document Revised: 09/26/2014 Document Reviewed: 05/08/2014 Elsevier Interactive Patient Education 2016 [place].  Hyperglycemia Hyperglycemia occurs when the glucose (sugar) in your blood is too high. Hyperglycemia can happen for many reasons, but it most often happens to people who do not know they have diabetes or are not managing their diabetes properly.  CAUSES  Whether you have diabetes or not, there are other causes of hyperglycemia. Hyperglycemia can occur when you have diabetes, but it can also occur in other situations that you might not be as aware of, such as: Diabetes  If you have diabetes and are having problems controlling your blood glucose, hyperglycemia could occur because of some of the following reasons:  Not following your meal plan.  Not taking your diabetes medications or not taking it properly.  Exercising less or doing less activity than you normally do.  Being sick. Pre-diabetes  This cannot be ignored. Before people develop Type 2 diabetes, they almost always have "pre-diabetes."  This is when your blood glucose levels are higher than normal, but not yet high enough to be diagnosed as diabetes. Research has shown that some long-term damage to the body, especially the heart and circulatory system, may already be occurring during pre-diabetes. If you take action to manage your blood glucose when you have pre-diabetes, you may delay or prevent Type 2 diabetes from developing. Stress  If you have diabetes, you may be "diet" controlled or on oral medications or insulin to control your diabetes. However, you may find that your blood glucose is higher than usual in the hospital whether you have diabetes or not. This is often referred to as "stress hyperglycemia." Stress can elevate your blood glucose. This happens because of hormones put out by the body during times of stress. If stress has been the cause of your high blood glucose, it can be followed regularly by your caregiver. That way he/she can make sure your hyperglycemia does not continue to get worse or progress to diabetes. Steroids  Steroids are medications that act on the infection fighting system (immune system) to block inflammation or infection. One side effect can be a rise in blood glucose. Most people can produce enough extra insulin to allow for this rise, but for  those who cannot, steroids make blood glucose levels go even higher. It is not unusual for steroid treatments to "uncover" diabetes that is developing. It is not always possible to determine if the hyperglycemia will go away after the steroids are stopped. A special blood test called an A1c is sometimes done to determine if your blood glucose was elevated before the steroids were started. SYMPTOMS  Thirsty.  Frequent urination.  Dry mouth.  Blurred vision.  Tired or fatigue.  Weakness.  Sleepy.  Tingling in feet or leg. DIAGNOSIS  Diagnosis is made by monitoring blood glucose in one or all of the following ways:  A1c test. This is a chemical  found in your blood.  Fingerstick blood glucose monitoring.  Laboratory results. TREATMENT  First, knowing the cause of the hyperglycemia is important before the hyperglycemia can be treated. Treatment may include, but is not be limited to:  Education.  Change or adjustment in medications.  Change or adjustment in meal plan.  Treatment for an illness, infection, etc.  More frequent blood glucose monitoring.  Change in exercise plan.  Decreasing or stopping steroids.  Lifestyle changes. HOME CARE INSTRUCTIONS   Test your blood glucose as directed.  Exercise regularly. Your caregiver will give you instructions about exercise. Pre-diabetes or diabetes which comes on with stress is helped by exercising.  Eat wholesome, balanced meals. Eat often and at regular, fixed times. Your caregiver or nutritionist will give you a meal plan to guide your sugar intake.  Being at an ideal weight is important. If needed, losing as little as 10 to 15 pounds may help improve blood glucose levels. SEEK MEDICAL CARE IF:   You have questions about medicine, activity, or diet.  You continue to have symptoms (problems such as increased thirst, urination, or weight gain). SEEK IMMEDIATE MEDICAL CARE IF:   You are vomiting or have diarrhea.  Your breath smells fruity.  You are breathing faster or slower.  You are very sleepy or incoherent.  You have numbness, tingling, or pain in your feet or hands.  You have chest pain.  Your symptoms get worse even though you have been following your caregiver's orders.  If you have any other questions or concerns.   This information is not intended to replace advice given to you by your health care provider. Make sure you discuss any questions you have with your health care provider.   Document Released: 11/05/2000 Document Revised: 08/04/2011 Document Reviewed: 01/16/2015 Elsevier Interactive Patient Education 2016 [employer]  Celecoxib  capsules What is this medicine? CELECOXIB (sell a KOX ib) is a non-steroidal anti-inflammatory drug (NSAID). This medicine is used to treat arthritis and ankylosing spondylitis. It may be also used for pain or painful monthly periods. This medicine may be used for other purposes; ask your health care provider or pharmacist if you have questions. What should I tell my health care provider before I take this medicine? They need to know if you have any of these conditions: -asthma -coronary artery bypass graft (CABG) surgery within the past 2 weeks -drink more than 3 alcohol-containing drinks a day -heart disease or circulation problems like heart failure or leg edema (fluid retention) -high blood pressure -kidney disease -liver disease -stomach bleeding or ulcers -an unusual or allergic reaction to celecoxib, sulfa drugs, aspirin, other NSAIDs, other medicines, foods, dyes, or preservatives -pregnant or trying to get pregnant -breast-feeding How should I use this medicine? Take this medicine by mouth with a full glass of water. Follow  the directions on the prescription label. Take it with food if it upsets your stomach or if you take 400 mg at one time. Try to not lie down for at least 10 minutes after you take the medicine. Take the medicine at the same time each day. Do not take more medicine than you are told to take. Long-term, continuous use may increase the risk of heart attack or stroke. A special MedGuide will be given to you by the pharmacist with each prescription and refill. Be sure to read this information carefully each time. Talk to your pediatrician regarding the use of this medicine in children. Special care may be needed. Overdosage: If you think you have taken too much of this medicine contact a poison control center or emergency room at once. NOTE: This medicine is only for you. Do not share this medicine with others. What if I miss a dose? If you miss a dose, take it as soon  as you can. If it is almost time for your next dose, take only that dose. Do not take double or extra doses. What may interact with this medicine? Do not take this medicine with any of the following medications: -cidofovir -methotrexate -other NSAIDs, medicines for pain and inflammation, like ibuprofen or naproxen -pemetrexed This medicine may also interact with the following medications: -alcohol -aspirin and aspirin-like drugs -diuretics -fluconazole -lithium -medicines for high blood pressure -steroid medicines like prednisone or cortisone -warfarin This list may not describe all possible interactions. Give your health care provider a list of all the medicines, herbs, non-prescription drugs, or dietary supplements you use. Also tell them if you smoke, drink alcohol, or use illegal drugs. Some items may interact with your medicine. What should I watch for while using this medicine? Tell your doctor or health care professional if your pain does not get better. Talk to your doctor before taking another medicine for pain. Do not treat yourself. This medicine does not prevent heart attack or stroke. In fact, this medicine may increase the chance of a heart attack or stroke. The chance may increase with longer use of this medicine and in people who have heart disease. If you take aspirin to prevent heart attack or stroke, talk with your doctor or health care professional. Do not take medicines such as ibuprofen and naproxen with this medicine. Side effects such as stomach upset, nausea, or ulcers may be more likely to occur. Many medicines available without a prescription should not be taken with this medicine. This medicine can cause ulcers and bleeding in the stomach and intestines at any time during treatment. Ulcers and bleeding can happen without warning symptoms and can cause death. What side effects may I notice from receiving this medicine? Side effects that you should report to your  doctor or health care professional as soon as possible: -allergic reactions like skin rash, itching or hives, swelling of the face, lips, or tongue -black or bloody stools, blood in the urine or vomit -blurred vision -breathing problems -chest pain -nausea, vomiting -problems with balance, talking, walking -redness, blistering, peeling or loosening of the skin, including inside the mouth -unexplained weight gain or swelling -unusually weak or tired -yellowing of eyes, skin Side effects that usually do not require medical attention (report to your doctor or health care professional if they continue or are bothersome): -constipation or diarrhea -dizziness -gas or heartburn -upset stomach This list may not describe all possible side effects. Call your doctor for medical advice about side effects. You  may report side effects to FDA at 1-800-FDA-1088. Where should I keep my medicine? Keep out of the reach of children. Store at room temperature between 15 and 30 degrees C (59 and 86 degrees F). Keep container tightly closed. Throw away any unused medicine after the expiration date. NOTE: This sheet is a summary. It may not cover all possible information. If you have questions about this medicine, talk to your doctor, pharmacist, or health care provider.    2016, [employer]. (2009-07-11 10:54:17)

## 2015-08-02 NOTE — ED Provider Notes (Signed)
CSN: [id number]     Arrival date & time 08/02/15  0258 History   First MD Initiated Contact with Patient 08/02/15 [phone]     Chief Complaint  Patient presents with  . Numbness     (Consider location/radiation/quality/duration/timing/severity/associated sxs/prior Treatment) The history is provided by the patient.   45 year old female comes in with approximately 3 days of pain in her right shoulder which radiates up to the neck and down the arm. She is also noticed numbness in her right arm and hand. She states she has had some weakness and has been dropping things from her right hand. There has been a bifrontal headache. She denies specific leg weakness but that states she fell 2 days ago. She rates her pain at 10/10. She is concerned that this may be a stroke. She has taken tramadol at home which did give her slight,  temporary  relief.  Past Medical History  Diagnosis Date  . Diabetes mellitus   . Bipolar disorder ([place])   . Depression   . Anxiety   . Hypertension   . Anemia   . Schizoaffective disorder [facility])    Past Surgical History  Procedure Laterality Date  . Cesarean section    . Tubal ligation      X3  . Cholecystectomy  06/02/2012    Procedure: LAPAROSCOPIC CHOLECYSTECTOMY;  Surgeon: Jamesetta So, MD;  Location: [facility];  Service: General;  Laterality: N/A;  Attempted laparoscopic cholecystectomy  . Cholecystectomy  06/02/2012    Procedure: CHOLECYSTECTOMY;  Surgeon: Jamesetta So, MD;  Location: [facility];  Service: General;  Laterality: N/A;  converted to open at  0905  . Breast surgery     Family History  Problem Relation Age of Onset  . Stroke Other   . Diabetes Other   . Cancer Other   . Seizures Other   . Hypertension Mother   . Alcohol abuse Mother   . Hypertension Father   . Alcohol abuse Sister   . Alcohol abuse Maternal Aunt   . Alcohol abuse Paternal Aunt   . Alcohol abuse Maternal Grandfather   . Alcohol abuse Maternal Grandmother   . Alcohol abuse Cousin     Social History  Substance Use Topics  . Smoking status: Never Smoker   . Smokeless tobacco: Never Used  . Alcohol Use: No   OB History    Gravida Para Term Preterm AB TAB SAB Ectopic Multiple Living   3 3 3       3      Review of Systems  All other systems reviewed and are negative.     Allergies  Review of patient's allergies indicates no known allergies.  Home Medications   Prior to Admission medications   Medication Sig Start Date End Date Taking? Authorizing Provider  albuterol (PROVENTIL HFA;VENTOLIN HFA) 108 (90 BASE) MCG/ACT inhaler Inhale 2 puffs into the lungs every 6 (six) hours as needed for wheezing.   Yes Historical Provider, MD  aspirin EC 81 MG tablet Take 81 mg by mouth daily.   Yes Historical Provider, MD  atorvastatin (LIPITOR) 10 MG tablet Take 1 tablet by mouth daily. 05/04/15  Yes Historical Provider, MD  benztropine (COGENTIN) 1 MG tablet Take 1 tablet (1 mg total) by mouth at bedtime. 07/06/15 07/05/16 Yes Cloria Spring, MD  dicyclomine (BENTYL) 20 MG tablet Take 1 tablet (20 mg total) by mouth 2 (two) times daily. 04/24/15  Yes Tanna Furry, MD  DULoxetine (CYMBALTA) 60 MG capsule Take  1 capsule (60 mg total) by mouth daily at 6 PM. 07/06/15  Yes Cloria Spring, MD  gabapentin (NEURONTIN) 300 MG capsule Take 300 mg by mouth 2 (two) times daily. 03/28/15  Yes Historical Provider, MD  glimepiride (AMARYL) 4 MG tablet Take 4 mg by mouth daily.   Yes Historical Provider, MD  insulin aspart (NOVOLOG) 100 UNIT/ML injection Inject 15 Units into the skin 3 (three) times daily before meals.   Yes Historical Provider, MD  levothyroxine (SYNTHROID, LEVOTHROID) 25 MCG tablet Take 25 mcg by mouth daily before breakfast.  04/15/15  Yes Historical Provider, MD  Linaclotide (LINZESS) 145 MCG CAPS capsule Take 145 mcg by mouth daily.   Yes Historical Provider, MD  lisinopril (PRINIVIL,ZESTRIL) 10 MG tablet Take 1 tablet by mouth daily. 05/04/15  Yes Historical Provider, MD   lithium carbonate 300 MG capsule Take 2 capsules in the am and 3 at bedtime 07/06/15  Yes Cloria Spring, MD  metFORMIN (GLUCOPHAGE) 1000 MG tablet Take 1,000 mg by mouth 2 (two) times daily. 04/20/13  Yes Historical Provider, MD  methocarbamol (ROBAXIN) 500 MG tablet Take 1 tablet by mouth. 05/04/15  Yes Historical Provider, MD  omeprazole (PRILOSEC) 20 MG capsule Take 20 mg by mouth daily.   Yes Historical Provider, MD  ondansetron (ZOFRAN ODT) 4 MG disintegrating tablet Take 1 tablet (4 mg total) by mouth every 8 (eight) hours as needed for nausea. 04/24/15  Yes Tanna Furry, MD  risperiDONE (RISPERDAL) 3 MG tablet Take 1 tablet (3 mg total) by mouth at bedtime. 07/06/15 07/05/16 Yes Cloria Spring, MD  TOUJEO SOLOSTAR 300 UNIT/ML SOPN Inject 52 Units into the skin at bedtime.  04/15/15  Yes Historical Provider, MD  traMADol (ULTRAM) 50 MG tablet Take 1 tablet by mouth daily as needed. 05/04/15  Yes Historical Provider, MD  traZODone (DESYREL) 100 MG tablet Take 2 tablets (200 mg total) by mouth at bedtime. 07/06/15  Yes Cloria Spring, MD  Cholecalciferol 50000 UNITS TABS Take 1 tablet by mouth every 7 (seven) days. Reported on 07/06/2015    Historical Provider, MD  exenatide (BYETTA 10 MCG PEN) 10 MCG/0.04ML SOLN Inject 15 mcg into the skin every evening.     Historical Provider, MD  penicillin v potassium (VEETID) 500 MG tablet Take 500 mg by mouth daily.  04/10/15   Historical Provider, MD  pioglitazone (ACTOS) 15 MG tablet Take 15 mg by mouth daily.    Historical Provider, MD   BP 122/81 mmHg  Pulse 90  Temp(Src) 98.4 F (36.9 C) (Oral)  Resp 18  Ht 5' (1.524 m)  Wt 154 lb (69.854 kg)  BMI 30.08 kg/m2  SpO2 100%  LMP 01/23/2013 Physical Exam  Nursing note and vitals reviewed.  45 year old female, resting comfortably and in no acute distress. Vital signs are normal . Oxygen saturation is 100%, which is normal. Head is normocephalic and atraumatic. PERRLA, EOMI. Oropharynx is  clear. Nechas mild tenderness diffusely. Neck isle without adenopathy or JVD. Back is nontender and there is no CVA tenderness. Lungs are clear without rales, wheezes, or rhonchi. Chest is nontender. Heart has regular rate and rhythm without murmur. Abdomen is soft, flat, nontender without masses or hepatosplenomegaly and peristalsis is normoactive. Extremities: There is tenderness rather diffusely throughout the right shoulder. Rotator cuff impingement signs are present. Skin is warm and dry without rash. Neurologic: Mental status is normal, cranial nerves are intact, there are no motor or sensory deficits.Motor strength is 5/5 in  both arms and both legs. There is no pronator drift. There is no extinction on double simultaneous stimulation.   ED Course  Procedures (including critical care time) Labs Review Results for orders placed or performed during the hospital encounter of 08/02/15  CBC with Differential  Result Value Ref Range   WBC 9.2 4.0 - 10.5 K/uL   RBC 4.24 3.87 - 5.11 MIL/uL   Hemoglobin 12.0 12.0 - 15.0 g/dL   HCT 35.1 (L) 36.0 - 46.0 %   MCV 82.8 78.0 - 100.0 fL   MCH 28.3 26.0 - 34.0 pg   MCHC 34.2 30.0 - 36.0 g/dL   RDW 12.1 11.5 - 15.5 %   Platelets 327 150 - 400 K/uL   Neutrophils Relative % 61 %   Neutro Abs 5.6 1.7 - 7.7 K/uL   Lymphocytes Relative 30 %   Lymphs Abs 2.8 0.7 - 4.0 K/uL   Monocytes Relative 6 %   Monocytes Absolute 0.5 0.1 - 1.0 K/uL   Eosinophils Relative 3 %   Eosinophils Absolute 0.2 0.0 - 0.7 K/uL   Basophils Relative 0 %   Basophils Absolute 0.0 0.0 - 0.1 K/uL  Basic metabolic panel  Result Value Ref Range   Sodium 130 (L) 135 - 145 mmol/L   Potassium 4.0 3.5 - 5.1 mmol/L   Chloride 95 (L) 101 - 111 mmol/L   CO2 29 22 - 32 mmol/L   Glucose, Bld 525 (H) 65 - 99 mg/dL   BUN 7 6 - 20 mg/dL   Creatinine, Ser 0.83 0.44 - 1.00 mg/dL   Calcium 9.7 8.9 - 10.3 mg/dL   GFR calc non Af Amer >60 >60 mL/min   GFR calc Af Amer >60 >60 mL/min    Anion gap 6 5 - 15  POC CBG, ED  Result Value Ref Range   Glucose-Capillary 517 (H) 65 - 99 mg/dL   Imaging Review Ct Head Wo Contrast  08/02/2015  CLINICAL DATA:  Numbness and tingling in the right arm and hand. EXAM: CT HEAD WITHOUT CONTRAST TECHNIQUE: Contiguous axial images were obtained from the base of the skull through the vertex without intravenous contrast. COMPARISON:  MRI brain 01/11/2013.  CT head 04/01/2003 FINDINGS: Ventricles and sulci appear symmetrical. No ventricular dilatation. No mass effect or midline shift. No abnormal extra-axial fluid collections. Gray-white matter junctions are distinct. Basal cisterns are not effaced. No evidence of acute intracranial hemorrhage. No depressed skull fractures. Fluid in the sphenoid sinuses likely inflammatory. Mastoid air cells are not opacified. IMPRESSION: No acute intracranial abnormalities. Probable inflammatory changes in the sphenoid sinus. Electronically Signed   By: Lucienne Capers M.D.   On: 08/02/2015 04:38   I have personally reviewed and evaluated these images and lab results as part of my medical decision-making.   MDM   Final diagnoses:  Pain in right shoulder  Paresthesia of right arm  Hyperglycemia    Pain in right shoulder and numbness in the right arm. I do believe this is musculoskeletal skeletal related to shoulder problems although primary cervical radiculopathy is also possibility. Old records are reviewed and she asked she has ED visits in the past with similar presentations. She will be sent for CT of head, screening labs obtained, and she'll be given a dose of ketorolac.  She had significant relief with ketorolac. CT shows no acute process or stroke. Laboratory workup is significant for blood sugar of 525. Review old records shows she frequently has poorly controlled blood sugars. She is given  a dose of insulin with only slight decrease in her blood sugar. She is anxious to go so she is given a second dose of  insulin and advised to monitor blood sugars closely at home. NSAIDs are not given on discharge because of interaction with lithium so she is sent home with prescription for celecoxib. Follow-up with her PCP. She may benefit from physical therapy and may benefit from referral to orthopedics.  Delora Fuel, MD [id number]

## 2015-08-15 ENCOUNTER — Ambulatory Visit (HOSPITAL_COMMUNITY): Payer: Self-pay | Admitting: Psychiatry

## 2015-08-22 ENCOUNTER — Ambulatory Visit (INDEPENDENT_AMBULATORY_CARE_PROVIDER_SITE_OTHER): Payer: Medicare Other | Admitting: Internal Medicine

## 2015-08-22 ENCOUNTER — Other Ambulatory Visit (INDEPENDENT_AMBULATORY_CARE_PROVIDER_SITE_OTHER): Payer: Self-pay | Admitting: Internal Medicine

## 2015-08-22 ENCOUNTER — Encounter (INDEPENDENT_AMBULATORY_CARE_PROVIDER_SITE_OTHER): Payer: Self-pay | Admitting: Internal Medicine

## 2015-08-22 VITALS — BP 90/62 | HR 84 | Temp 98.3°F | Ht 60.0 in | Wt 157.5 lb

## 2015-08-22 DIAGNOSIS — R1013 Epigastric pain: Secondary | ICD-10-CM

## 2015-08-22 DIAGNOSIS — R140 Abdominal distension (gaseous): Secondary | ICD-10-CM

## 2015-08-22 DIAGNOSIS — K219 Gastro-esophageal reflux disease without esophagitis: Secondary | ICD-10-CM | POA: Diagnosis not present

## 2015-08-22 DIAGNOSIS — I10 Essential (primary) hypertension: Secondary | ICD-10-CM | POA: Insufficient documentation

## 2015-08-22 DIAGNOSIS — G8929 Other chronic pain: Secondary | ICD-10-CM

## 2015-08-22 DIAGNOSIS — E119 Type 2 diabetes mellitus without complications: Secondary | ICD-10-CM | POA: Insufficient documentation

## 2015-08-22 DIAGNOSIS — R110 Nausea: Secondary | ICD-10-CM

## 2015-08-22 MED ORDER — PANTOPRAZOLE SODIUM 40 MG PO TBEC: 40.0000 mg | DELAYED_RELEASE_TABLET | Freq: Every day | ORAL | Status: DC

## 2015-08-22 NOTE — Progress Notes (Signed)
Subjective:    Patient ID: Kathryn [name], female    DOB: [birth year], 45 y.o.   MRN: [id number]  HPI Referred by [facility] for intermitted abdominal pain and nausea.  Hx of diabetes.  She tells me she is having problems with her "stomach". She has a burning sensation after she eats. She bloats after she eats. No dysphagia. She has nausea after she eats. Rarely vomits.  No hematemesis. Appetite is not good. She says she has not been in the mood to eat. Daughter states she had lost some weight but she has gained her weight back. No change in her BMs. She has a BM once every other day. No melena or BRRB. She points to her epigastric region and tells me she is hurting today.  Blood sugars are running 300/greater.  Diabetic since 2011. Hypertension since 2011.   07/20/2015 Glucose 327, Albumin 125, ALT 23, AST 19, WBC 6.8, H and H 12.1 and 37.3 H.A1C 9.0  05/30/2015 CT abdomen/pelvis with CM.:       CLINICAL DATA: Mid to lower abdominal pain for 3 weeks, 20 pound weight loss in 3 months but has gained weight back recently, diarrhea, nausea, diabetes mellitus, hypertension   IMPRESSION: No acute intra-abdominal or intrapelvic abnormalities.  Minimal pericardial effusion.     CBC    Component Value Date/Time   WBC 9.2 08/02/2015 0410   RBC 4.24 08/02/2015 0410   HGB 12.0 08/02/2015 0410   HCT 35.1* 08/02/2015 0410   PLT 327 08/02/2015 0410   MCV 82.8 08/02/2015 0410   MCH 28.3 08/02/2015 0410   MCHC 34.2 08/02/2015 0410   RDW 12.1 08/02/2015 0410   LYMPHSABS 2.8 08/02/2015 0410   MONOABS 0.5 08/02/2015 0410   EOSABS 0.2 08/02/2015 0410   BASOSABS 0.0 08/02/2015 0410   Hepatic Function Panel     Component Value Date/Time   PROT 7.7 04/24/2015 1714   ALBUMIN 4.1 04/24/2015 1714   AST 21 04/24/2015 1714   ALT 32 04/24/2015 1714   ALKPHOS 79 04/24/2015 1714   BILITOT 0.6 04/24/2015 1714   BILIDIR <0.1 06/02/2012 1300   IBILI NOT CALCULATED  06/02/2012 1300       Review of Systems Past Medical History  Diagnosis Date  . Diabetes mellitus   . Bipolar disorder ([place])   . Depression   . Anxiety   . Hypertension   . Anemia   . Schizoaffective disorder [facility])     Past Surgical History  Procedure Laterality Date  . Cesarean section    . Tubal ligation      X3  . Cholecystectomy  06/02/2012    Procedure: LAPAROSCOPIC CHOLECYSTECTOMY;  Surgeon: Jamesetta So, MD;  Location: [facility];  Service: General;  Laterality: N/A;  Attempted laparoscopic cholecystectomy  . Cholecystectomy  06/02/2012    Procedure: CHOLECYSTECTOMY;  Surgeon: Jamesetta So, MD;  Location: [facility];  Service: General;  Laterality: N/A;  converted to open at  0905  . Breast surgery      No Known Allergies  Current Outpatient Prescriptions on File Prior to Visit  Medication Sig Dispense Refill  . albuterol (PROVENTIL HFA;VENTOLIN HFA) 108 (90 BASE) MCG/ACT inhaler Inhale 2 puffs into the lungs every 6 (six) hours as needed for wheezing.    Marland Kitchen aspirin EC 81 MG tablet Take 81 mg by mouth daily.    Marland Kitchen atorvastatin (LIPITOR) 10 MG tablet Take 1 tablet by mouth daily.    . benztropine (COGENTIN) 1 MG  tablet Take 1 tablet (1 mg total) by mouth at bedtime. 30 tablet 2  . DULoxetine (CYMBALTA) 60 MG capsule Take 1 capsule (60 mg total) by mouth daily at 6 PM. 30 capsule 2  . gabapentin (NEURONTIN) 300 MG capsule Take 300 mg by mouth 2 (two) times daily.    Marland Kitchen levothyroxine (SYNTHROID, LEVOTHROID) 25 MCG tablet Take 25 mcg by mouth daily before breakfast.     . Linaclotide (LINZESS) 145 MCG CAPS capsule Take 145 mcg by mouth daily.    Marland Kitchen lisinopril (PRINIVIL,ZESTRIL) 10 MG tablet Take 1 tablet by mouth daily.    Marland Kitchen lithium carbonate 300 MG capsule Take 2 capsules in the am and 3 at bedtime 150 capsule 2  . metFORMIN (GLUCOPHAGE) 1000 MG tablet Take 1,000 mg by mouth 2 (two) times daily.    . methocarbamol (ROBAXIN) 500 MG tablet Take 1 tablet by mouth as needed.     Marland Kitchen  omeprazole (PRILOSEC) 20 MG capsule Take 20 mg by mouth daily.    . pioglitazone (ACTOS) 15 MG tablet Take 15 mg by mouth daily.    . risperiDONE (RISPERDAL) 3 MG tablet Take 1 tablet (3 mg total) by mouth at bedtime. 30 tablet 2  . TOUJEO SOLOSTAR 300 UNIT/ML SOPN Inject 70 Units into the skin at bedtime.     . traMADol (ULTRAM) 50 MG tablet Take 1 tablet by mouth daily as needed.    . traZODone (DESYREL) 100 MG tablet Take 2 tablets (200 mg total) by mouth at bedtime. 60 tablet 2   No current facility-administered medications on file prior to visit.        Objective:   Physical Exam Blood pressure 90/62, pulse 84, temperature 98.3 F (36.8 C), height 5' (1.524 m), weight 157 lb 8 oz (71.442 kg), last menstrual period 01/23/2013.  Alert and oriented. Skin warm and dry. Oral mucosa is moist.   . Sclera anicteric, conjunctivae is pink. Thyroid not enlarged. No cervical lymphadenopathy. Lungs clear. Heart regular rate and rhythm.  Abdomen is soft. Bowel sounds are positive. No hepatomegaly. No abdominal masses felt. No tenderness.  No edema to lower extremities.         Assessment & Plan:  Epigastric pain. Hx of Diabetes. PUD needs to be ruled out as well as gastroparesis. EGD. Will stop the Omeprazole and start her on Protonix 40mg  daily 30 minutes before breakfast.  NM emptying study.

## 2015-08-22 NOTE — Patient Instructions (Signed)
EGD. The risks and benefits such as perforation, bleeding, and infection were reviewed with the patient and is agreeable. NM Gastric emptying study. Protonix 40mg  30 minutes before breakfast.

## 2015-08-23 ENCOUNTER — Encounter (HOSPITAL_COMMUNITY)
Admission: RE | Admit: 2015-08-23 | Discharge: 2015-08-23 | Disposition: A | Discharge status: Home / Self Care | Payer: Medicare Other | Source: Ambulatory Visit | Attending: Internal Medicine | Admitting: Internal Medicine

## 2015-08-24 ENCOUNTER — Encounter (HOSPITAL_COMMUNITY): Payer: Self-pay | Admitting: *Deleted

## 2015-08-24 ENCOUNTER — Ambulatory Visit (HOSPITAL_COMMUNITY): Payer: Medicare Other | Admitting: Anesthesiology

## 2015-08-24 ENCOUNTER — Ambulatory Visit (HOSPITAL_COMMUNITY)
Admission: RE | Admit: 2015-08-24 | Discharge: 2015-08-24 | Disposition: A | Discharge status: Home / Self Care | Payer: Medicare Other | Source: Ambulatory Visit | Attending: Internal Medicine | Admitting: Internal Medicine

## 2015-08-24 ENCOUNTER — Encounter (HOSPITAL_COMMUNITY)
Admission: RE | Disposition: A | Discharge status: Home / Self Care | Payer: Self-pay | Source: Ambulatory Visit | Attending: Internal Medicine

## 2015-08-24 DIAGNOSIS — I10 Essential (primary) hypertension: Secondary | ICD-10-CM | POA: Insufficient documentation

## 2015-08-24 DIAGNOSIS — R112 Nausea with vomiting, unspecified: Secondary | ICD-10-CM | POA: Diagnosis not present

## 2015-08-24 DIAGNOSIS — E119 Type 2 diabetes mellitus without complications: Secondary | ICD-10-CM | POA: Diagnosis not present

## 2015-08-24 DIAGNOSIS — F329 Major depressive disorder, single episode, unspecified: Secondary | ICD-10-CM | POA: Insufficient documentation

## 2015-08-24 DIAGNOSIS — G8929 Other chronic pain: Secondary | ICD-10-CM

## 2015-08-24 DIAGNOSIS — Z794 Long term (current) use of insulin: Secondary | ICD-10-CM | POA: Diagnosis not present

## 2015-08-24 DIAGNOSIS — Z79899 Other long term (current) drug therapy: Secondary | ICD-10-CM | POA: Diagnosis not present

## 2015-08-24 DIAGNOSIS — F419 Anxiety disorder, unspecified: Secondary | ICD-10-CM | POA: Insufficient documentation

## 2015-08-24 DIAGNOSIS — K2950 Unspecified chronic gastritis without bleeding: Secondary | ICD-10-CM | POA: Insufficient documentation

## 2015-08-24 DIAGNOSIS — Z7982 Long term (current) use of aspirin: Secondary | ICD-10-CM | POA: Diagnosis not present

## 2015-08-24 DIAGNOSIS — F319 Bipolar disorder, unspecified: Secondary | ICD-10-CM | POA: Diagnosis not present

## 2015-08-24 DIAGNOSIS — R110 Nausea: Secondary | ICD-10-CM

## 2015-08-24 DIAGNOSIS — F259 Schizoaffective disorder, unspecified: Secondary | ICD-10-CM | POA: Insufficient documentation

## 2015-08-24 DIAGNOSIS — R1013 Epigastric pain: Secondary | ICD-10-CM

## 2015-08-24 DIAGNOSIS — R140 Abdominal distension (gaseous): Secondary | ICD-10-CM | POA: Insufficient documentation

## 2015-08-24 DIAGNOSIS — K3189 Other diseases of stomach and duodenum: Secondary | ICD-10-CM | POA: Diagnosis not present

## 2015-08-24 HISTORY — PX: ESOPHAGOGASTRODUODENOSCOPY (EGD) WITH PROPOFOL: SHX5813

## 2015-08-24 LAB — GLUCOSE, CAPILLARY
Glucose-Capillary: 234 mg/dL — ABNORMAL HIGH (ref 65–99)
Glucose-Capillary: 230 mg/dL — ABNORMAL HIGH (ref 65–99)

## 2015-08-24 SURGERY — ESOPHAGOGASTRODUODENOSCOPY (EGD) WITH PROPOFOL
Anesthesia: Monitor Anesthesia Care

## 2015-08-24 MED ORDER — FENTANYL CITRATE (PF) 100 MCG/2ML IJ SOLN: 25.0000 ug | INTRAMUSCULAR | Status: DC | PRN

## 2015-08-24 MED ORDER — BUTAMBEN-TETRACAINE-BENZOCAINE 2-2-14 % EX AERO
2.0000 | INHALATION_SPRAY | Freq: Two times a day (BID) | CUTANEOUS | Status: AC
  Administered 2015-08-24 (×2): 2 via TOPICAL

## 2015-08-24 MED ORDER — PROPOFOL 10 MG/ML IV BOLUS
INTRAVENOUS | Status: AC
  Filled 2015-08-24: qty 20

## 2015-08-24 MED ORDER — ONDANSETRON HCL 4 MG/2ML IJ SOLN
INTRAMUSCULAR | Status: AC
  Filled 2015-08-24: qty 2

## 2015-08-24 MED ORDER — MIDAZOLAM HCL 2 MG/2ML IJ SOLN
1.0000 mg | INTRAMUSCULAR | Status: DC | PRN
  Administered 2015-08-24: 2 mg via INTRAVENOUS

## 2015-08-24 MED ORDER — ONDANSETRON HCL 4 MG/2ML IJ SOLN: 4.0000 mg | Freq: Once | INTRAMUSCULAR | Status: DC | PRN

## 2015-08-24 MED ORDER — MIDAZOLAM HCL 2 MG/2ML IJ SOLN
INTRAMUSCULAR | Status: AC
  Filled 2015-08-24: qty 2

## 2015-08-24 MED ORDER — ONDANSETRON HCL 4 MG/2ML IJ SOLN
4.0000 mg | Freq: Once | INTRAMUSCULAR | Status: AC
  Administered 2015-08-24: 4 mg via INTRAVENOUS

## 2015-08-24 MED ORDER — FENTANYL CITRATE (PF) 100 MCG/2ML IJ SOLN
INTRAMUSCULAR | Status: AC
  Filled 2015-08-24: qty 2

## 2015-08-24 MED ORDER — PROPOFOL 500 MG/50ML IV EMUL
INTRAVENOUS | Status: DC | PRN
  Administered 2015-08-24: 150 ug/kg/min via INTRAVENOUS

## 2015-08-24 MED ORDER — MIDAZOLAM HCL 5 MG/5ML IJ SOLN
INTRAMUSCULAR | Status: DC | PRN
  Administered 2015-08-24: 2 mg via INTRAVENOUS

## 2015-08-24 MED ORDER — ONDANSETRON HCL 4 MG PO TABS: 4.0000 mg | ORAL_TABLET | Freq: Two times a day (BID) | ORAL | Status: DC | PRN

## 2015-08-24 MED ORDER — LACTATED RINGERS IV SOLN
INTRAVENOUS | Status: DC
  Administered 2015-08-24: 13:00:00 via INTRAVENOUS

## 2015-08-24 MED ORDER — FENTANYL CITRATE (PF) 100 MCG/2ML IJ SOLN
25.0000 ug | INTRAMUSCULAR | Status: AC
  Administered 2015-08-24 (×2): 25 ug via INTRAVENOUS

## 2015-08-24 NOTE — Discharge Instructions (Signed)
Resume usual medications and diet. Ondansetron 4 mg by mouth twice daily as needed for nausea and vomiting. No driving for 24 hours. Gastric emptying study as planned. Physician will call with biopsy results.

## 2015-08-24 NOTE — Anesthesia Procedure Notes (Signed)
Procedure Name: MAC Date/Time: 08/24/2015 2:06 PM Performed by: Andree Elk, Climmie Buelow A Pre-anesthesia Checklist: Patient identified, Emergency Drugs available, Suction available, Patient being monitored and Timeout performed Patient Re-evaluated:Patient Re-evaluated prior to inductionOxygen Delivery Method: Simple face mask

## 2015-08-24 NOTE — Anesthesia Postprocedure Evaluation (Signed)
Anesthesia Post Note  Patient: Kathryn [name]  Procedure(s) Performed: Procedure(s) (LRB): ESOPHAGOGASTRODUODENOSCOPY (EGD) WITH PROPOFOL (N/A)  Patient location during evaluation: PACU Anesthesia Type: MAC Level of consciousness: awake and alert and oriented Pain management: pain level controlled Vital Signs Assessment: post-procedure vital signs reviewed and stable Respiratory status: patient connected to face mask oxygen and spontaneous breathing Cardiovascular status: stable Postop Assessment: no signs of nausea or vomiting Anesthetic complications: no    Last Vitals:  Filed Vitals:   08/24/15 1400 08/24/15 1405  BP: 108/80 108/80  Pulse:    Temp:    Resp: 14 15    Last Pain:  Filed Vitals:   08/24/15 1410  PainSc: 8                  Mel Langan A

## 2015-08-24 NOTE — Op Note (Signed)
[facility] Patient Name: Kathryn [name] Procedure Date: 08/24/2015 2:09 PM MRN: [id number] Date of Birth: [birth year] Attending MD: Hildred Laser , MD CSN: [id number] Age: 45 Admit Type: Outpatient Procedure:                Upper GI endoscopy Indications:              Abdominal distention, Nausea with vomiting Providers:                Hildred Laser, MD, Gwenlyn Fudge, RN Referring MD:              Medicines:                Monitored Anesthesia Care Complications:            No immediate complications. Estimated Blood Loss:     Estimated blood loss was minimal. Procedure:                Pre-Anesthesia Assessment:                           - Prior to the procedure, a History and Physical                            was performed, and patient medications and                            allergies were reviewed. The patient's tolerance of                            previous anesthesia was also reviewed. The risks                            and benefits of the procedure and the sedation                            options and risks were discussed with the patient.                            All questions were answered, and informed consent                            was obtained. Prior Anticoagulants: The patient has                            taken no previous anticoagulant or antiplatelet                            agents. ASA Grade Assessment: III - A patient with                            severe systemic disease. After reviewing the risks                            and benefits, the patient was deemed in  satisfactory condition to undergo the procedure.                           After obtaining informed consent, the endoscope was                            passed under direct vision. Throughout the                            procedure, the patient's blood pressure, pulse, and                            oxygen saturations were monitored continuously. The                        [id number]) scope was introduced through the                            mouth, and advanced to the second part of duodenum.                            The upper GI endoscopy was accomplished without                            difficulty. The patient tolerated the procedure                            well. Scope In: 2:12:52 PM Scope Out: 2:18:53 PM Total Procedure Duration: 0 hours 6 minutes 1 second  Findings:      The examined esophagus was normal.      The Z-line was regular and was found 34 cm from the incisors.      Diffuse granular mucosa was found on the anterior wall of the stomach.       Biopsies were taken with a cold forceps for histology.      The exam was otherwise without abnormality.      The duodenal bulb and second portion of the duodenum were normal. Impression:               - Normal esophagus.                           - Z-line regular, 34 cm from the incisors.                           - Granular antral gastric mucosa. Biopsied.                           - The examination was otherwise normal.                           - Normal duodenal bulb and second portion of the                            duodenum. Moderate Sedation:      Moderate (conscious) sedation was personally administered by an  anesthesia professional. The following parameters were monitored: oxygen       saturation, heart rate, blood pressure, CO2 capnography and response to       care. Recommendation:           - Patient has a contact number available for                            emergencies. The signs and symptoms of potential                            delayed complications were discussed with the                            patient. Return to normal activities tomorrow.                            Written discharge instructions were provided to the                            patient.                           - Resume previous diet today.                            - Continue present medications.                           - Await pathology results.                           - Use Zofran (ondansetron) 4 mg PO wice daily as                            needed.                           - Solid-phase gastric emptying study Procedure Code(s):        --- Professional ---                           [phone], Esophagogastroduodenoscopy, flexible,                            transoral; with biopsy, single or multiple Diagnosis Code(s):        --- Professional ---                           K31.89, Other diseases of stomach and duodenum                           R14.0, Abdominal distension (gaseous)                           R11.2, Nausea with vomiting, unspecified CPT copyright 2016 American Medical Association. All rights reserved. The codes documented in this report are preliminary and upon coder review  may  be revised to meet current compliance requirements. Hildred Laser, MD Hildred Laser, MD 08/24/2015 2:29:07 PM This report has been signed electronically. Number of Addenda: 0

## 2015-08-24 NOTE — Anesthesia Preprocedure Evaluation (Signed)
Anesthesia Evaluation  Patient identified by MRN, date of birth, ID band Patient awake    Reviewed: Allergy & Precautions, NPO status , Patient's Chart, lab work & pertinent test results  Airway Mallampati: III  TM Distance: >3 FB     Dental  (+) Teeth Intact   Pulmonary neg pulmonary ROS,    breath sounds clear to auscultation       Cardiovascular hypertension, Pt. on medications  Rhythm:Regular Rate:Normal     Neuro/Psych PSYCHIATRIC DISORDERS Anxiety Depression Bipolar Disorder Schizophrenia    GI/Hepatic   Endo/Other  diabetes, Poorly Controlled, Type 2  Renal/GU      Musculoskeletal   Abdominal   Peds  Hematology   Anesthesia Other Findings   Reproductive/Obstetrics                             Anesthesia Physical Anesthesia Plan  ASA: III  Anesthesia Plan: MAC   Post-op Pain Management:    Induction: Intravenous  Airway Management Planned: Simple Face Mask  Additional Equipment:   Intra-op Plan:   Post-operative Plan:   Informed Consent: I have reviewed the patients History and Physical, chart, labs and discussed the procedure including the risks, benefits and alternatives for the proposed anesthesia with the patient or authorized representative who has indicated his/her understanding and acceptance.     Plan Discussed with:   Anesthesia Plan Comments:         Anesthesia Quick Evaluation

## 2015-08-24 NOTE — Transfer of Care (Signed)
Immediate Anesthesia Transfer of Care Note  Patient: Kathryn [name]  Procedure(s) Performed: Procedure(s) with comments: ESOPHAGOGASTRODUODENOSCOPY (EGD) WITH PROPOFOL (N/A) - 1:10 - Ann to notify pt to arrive at 11:30  Patient Location: PACU  Anesthesia Type:MAC  Level of Consciousness: awake, oriented and patient cooperative  Airway & Oxygen Therapy: Patient Spontanous Breathing and Patient connected to face mask oxygen  Post-op Assessment: Report given to RN and Post -op Vital signs reviewed and stable  Post vital signs: Reviewed and stable  Last Vitals:  Filed Vitals:   08/24/15 1400 08/24/15 1405  BP: 108/80 108/80  Pulse:    Temp:    Resp: 14 15    Complications: No apparent anesthesia complications

## 2015-08-24 NOTE — H&P (Signed)
Kathryn [name] is an 45 y.o. female.   Chief Complaint: Patient is here for EGD. HPI: Patient is 45 year old African American female who presents with three-month history of epigastric pain daily nausea as well as vomiting. Vomiting June occurs after meals. Vomitus consists of food that she is eaten earlier sometimes the day before. She denies heartburn hematemesis melena or rectal bleeding. She does not take OTC NSAIDs. She states she has lost 3 pounds since her symptoms began. Recent workup includes negative abdominopelvic CT negative CBC and comprehensive chemistry panel. Patient has been diabetic for 6 years.  Past Medical History  Diagnosis Date  . Diabetes mellitus   . Bipolar disorder ([place])   . Depression   . Anxiety   . Hypertension   . Anemia   . Schizoaffective disorder [facility])     Past Surgical History  Procedure Laterality Date  . Cesarean section    . Tubal ligation      X3  . Cholecystectomy  06/02/2012    Procedure: LAPAROSCOPIC CHOLECYSTECTOMY;  Surgeon: Jamesetta So, MD;  Location: [facility];  Service: General;  Laterality: N/A;  Attempted laparoscopic cholecystectomy  . Cholecystectomy  06/02/2012    Procedure: CHOLECYSTECTOMY;  Surgeon: Jamesetta So, MD;  Location: [facility];  Service: General;  Laterality: N/A;  converted to open at  0905  . Breast surgery      Family History  Problem Relation Age of Onset  . Stroke Other   . Diabetes Other   . Cancer Other   . Seizures Other   . Hypertension Mother   . Alcohol abuse Mother   . Hypertension Father   . Alcohol abuse Sister   . Alcohol abuse Maternal Aunt   . Alcohol abuse Paternal Aunt   . Alcohol abuse Maternal Grandfather   . Alcohol abuse Maternal Grandmother   . Alcohol abuse Cousin    Social History:  reports that she has never smoked. She has never used smokeless tobacco. She reports that she does not drink alcohol or use illicit drugs.  Allergies: No Known Allergies  Medications Prior to Admission   Medication Sig Dispense Refill  . acarbose (PRECOSE) 100 MG tablet Take 100 mg by mouth daily.    Marland Kitchen albuterol (PROVENTIL HFA;VENTOLIN HFA) 108 (90 BASE) MCG/ACT inhaler Inhale 2 puffs into the lungs every 6 (six) hours as needed for wheezing.    Marland Kitchen aspirin EC 81 MG tablet Take 81 mg by mouth daily.    Marland Kitchen atorvastatin (LIPITOR) 10 MG tablet Take 1 tablet by mouth daily.    . benztropine (COGENTIN) 1 MG tablet Take 1 tablet (1 mg total) by mouth at bedtime. 30 tablet 2  . DULoxetine (CYMBALTA) 60 MG capsule Take 1 capsule (60 mg total) by mouth daily at 6 PM. 30 capsule 2  . gabapentin (NEURONTIN) 300 MG capsule Take 300 mg by mouth 2 (two) times daily.    . insulin lispro (HUMALOG) 100 UNIT/ML injection Inject into the skin 3 (three) times daily before meals.    Marland Kitchen levothyroxine (SYNTHROID, LEVOTHROID) 25 MCG tablet Take 25 mcg by mouth daily before breakfast.     . Linaclotide (LINZESS) 145 MCG CAPS capsule Take 145 mcg by mouth daily. Reported on 08/22/2015    . lisinopril (PRINIVIL,ZESTRIL) 10 MG tablet Take 1 tablet by mouth daily.    Marland Kitchen lithium carbonate 300 MG capsule Take 2 capsules in the am and 3 at bedtime 150 capsule 2  . metFORMIN (GLUCOPHAGE) 1000 MG tablet  Take 1,000 mg by mouth 2 (two) times daily.    . methocarbamol (ROBAXIN) 500 MG tablet Take 1 tablet by mouth as needed.     Marland Kitchen omeprazole (PRILOSEC) 20 MG capsule Take 20 mg by mouth daily.    . pantoprazole (PROTONIX) 40 MG tablet Take 1 tablet (40 mg total) by mouth daily. 30 tablet 5  . pioglitazone (ACTOS) 15 MG tablet Take 15 mg by mouth daily.    . risperiDONE (RISPERDAL) 3 MG tablet Take 1 tablet (3 mg total) by mouth at bedtime. 30 tablet 2  . TOUJEO SOLOSTAR 300 UNIT/ML SOPN Inject 70 Units into the skin at bedtime.     . traMADol (ULTRAM) 50 MG tablet Take 1 tablet by mouth daily as needed.    . traZODone (DESYREL) 100 MG tablet Take 2 tablets (200 mg total) by mouth at bedtime. 60 tablet 2  . Vitamin D, Ergocalciferol,  (DRISDOL) 50000 units CAPS capsule Take 50,000 Units by mouth every 7 (seven) days.      Results for orders placed or performed during the hospital encounter of 08/24/15 (from the past 48 hour(s))  Glucose, capillary     Status: Abnormal   Collection Time: 08/24/15 11:51 AM  Result Value Ref Range   Glucose-Capillary 234 (H) 65 - 99 mg/dL   No results found.  ROS  Blood pressure 100/71, pulse 89, temperature 98.1 F (36.7 C), temperature source Oral, resp. rate 22, height 5' (1.524 m), weight 157 lb (71.215 kg), last menstrual period 01/23/2013, SpO2 100 %. Physical Exam  Constitutional: She appears well-developed and well-nourished.  HENT:  Mouth/Throat: Oropharynx is clear and moist.  Eyes: Conjunctivae are normal. No scleral icterus.  Neck: No thyromegaly present.  Cardiovascular: Normal rate, regular rhythm and normal heart sounds.   No murmur heard. Respiratory: Effort normal and breath sounds normal.  GI:  Abdomen is full but soft with mild midepigastric tenderness. No organomegaly or masses.  Musculoskeletal: She exhibits no edema.  Lymphadenopathy:    She has no cervical adenopathy.  Neurological: She is alert.  Skin: Skin is warm and dry.     Assessment/Plan Nausea vomiting and epigastric pain. Diagnostic EGD.  Rogene Houston, MD 08/24/2015, 2:00 PM

## 2015-08-27 ENCOUNTER — Telehealth (INDEPENDENT_AMBULATORY_CARE_PROVIDER_SITE_OTHER): Payer: Self-pay | Admitting: *Deleted

## 2015-08-27 NOTE — Telephone Encounter (Signed)
Patient had EGD Friday (3/31) -- still having stomach pains, wants to know if this is normal, please call [phone]

## 2015-08-27 NOTE — Telephone Encounter (Signed)
Advised her to try some Pepto Bismol. If pain increases in her lower abdomen, she was advised to go to the ED

## 2015-08-27 NOTE — Telephone Encounter (Signed)
Patient states she is having lower abd pain since this AM 9 on a scales of 1-10.   No fever No gas No diarrhea  Just lower abd pain.  Ha procedure last week and wonders if she should be feeling this way.

## 2015-08-29 ENCOUNTER — Encounter (HOSPITAL_COMMUNITY)
Admission: RE | Admit: 2015-08-29 | Discharge: 2015-08-29 | Disposition: A | Discharge status: Home / Self Care | Payer: Medicare Other | Source: Ambulatory Visit | Attending: Internal Medicine | Admitting: Internal Medicine

## 2015-08-29 ENCOUNTER — Encounter (HOSPITAL_COMMUNITY): Payer: Self-pay

## 2015-08-29 DIAGNOSIS — K219 Gastro-esophageal reflux disease without esophagitis: Secondary | ICD-10-CM | POA: Diagnosis present

## 2015-08-29 DIAGNOSIS — R1013 Epigastric pain: Secondary | ICD-10-CM | POA: Insufficient documentation

## 2015-08-29 DIAGNOSIS — R110 Nausea: Secondary | ICD-10-CM | POA: Diagnosis present

## 2015-08-29 MED ORDER — TECHNETIUM TC 99M SULFUR COLLOID
2.0000 | Freq: Once | INTRAVENOUS | Status: AC | PRN
  Administered 2015-08-29: 2 via ORAL

## 2015-08-30 ENCOUNTER — Telehealth (INDEPENDENT_AMBULATORY_CARE_PROVIDER_SITE_OTHER): Payer: Self-pay | Admitting: Internal Medicine

## 2015-08-30 DIAGNOSIS — K219 Gastro-esophageal reflux disease without esophagitis: Secondary | ICD-10-CM

## 2015-08-30 MED ORDER — SUCRALFATE 1 GM/10ML PO SUSP: 1.0000 g | Freq: Four times a day (QID) | ORAL | Status: DC

## 2015-08-30 NOTE — Telephone Encounter (Signed)
Rx for Carafate sent to her pharmacy 

## 2015-09-03 ENCOUNTER — Ambulatory Visit (HOSPITAL_COMMUNITY): Payer: Self-pay | Admitting: Psychiatry

## 2015-09-06 ENCOUNTER — Emergency Department (HOSPITAL_COMMUNITY): Payer: Medicare Other

## 2015-09-06 ENCOUNTER — Observation Stay (HOSPITAL_COMMUNITY)
Admission: EM | Admit: 2015-09-06 | Discharge: 2015-09-09 | Disposition: A | Discharge status: Home / Self Care | Payer: Medicare Other | Attending: Internal Medicine | Admitting: Internal Medicine

## 2015-09-06 ENCOUNTER — Other Ambulatory Visit: Payer: Self-pay

## 2015-09-06 ENCOUNTER — Encounter (HOSPITAL_COMMUNITY): Payer: Self-pay

## 2015-09-06 DIAGNOSIS — Z794 Long term (current) use of insulin: Secondary | ICD-10-CM | POA: Diagnosis not present

## 2015-09-06 DIAGNOSIS — F319 Bipolar disorder, unspecified: Secondary | ICD-10-CM | POA: Diagnosis not present

## 2015-09-06 DIAGNOSIS — R451 Restlessness and agitation: Secondary | ICD-10-CM | POA: Diagnosis not present

## 2015-09-06 DIAGNOSIS — R748 Abnormal levels of other serum enzymes: Secondary | ICD-10-CM | POA: Diagnosis present

## 2015-09-06 DIAGNOSIS — Z7951 Long term (current) use of inhaled steroids: Secondary | ICD-10-CM | POA: Diagnosis not present

## 2015-09-06 DIAGNOSIS — T56891A Toxic effect of other metals, accidental (unintentional), initial encounter: Secondary | ICD-10-CM | POA: Diagnosis present

## 2015-09-06 DIAGNOSIS — Z79899 Other long term (current) drug therapy: Secondary | ICD-10-CM | POA: Diagnosis not present

## 2015-09-06 DIAGNOSIS — F418 Other specified anxiety disorders: Secondary | ICD-10-CM | POA: Insufficient documentation

## 2015-09-06 DIAGNOSIS — E119 Type 2 diabetes mellitus without complications: Secondary | ICD-10-CM | POA: Diagnosis not present

## 2015-09-06 DIAGNOSIS — N289 Disorder of kidney and ureter, unspecified: Secondary | ICD-10-CM | POA: Diagnosis not present

## 2015-09-06 DIAGNOSIS — N179 Acute kidney failure, unspecified: Secondary | ICD-10-CM | POA: Diagnosis not present

## 2015-09-06 DIAGNOSIS — Z7984 Long term (current) use of oral hypoglycemic drugs: Secondary | ICD-10-CM | POA: Diagnosis not present

## 2015-09-06 DIAGNOSIS — Z7982 Long term (current) use of aspirin: Secondary | ICD-10-CM | POA: Diagnosis not present

## 2015-09-06 DIAGNOSIS — R4182 Altered mental status, unspecified: Secondary | ICD-10-CM | POA: Diagnosis present

## 2015-09-06 DIAGNOSIS — I10 Essential (primary) hypertension: Secondary | ICD-10-CM | POA: Insufficient documentation

## 2015-09-06 DIAGNOSIS — Z9889 Other specified postprocedural states: Secondary | ICD-10-CM | POA: Insufficient documentation

## 2015-09-06 LAB — COMPREHENSIVE METABOLIC PANEL
ALT: 153 U/L — ABNORMAL HIGH (ref 14–54)
AST: 64 U/L — ABNORMAL HIGH (ref 15–41)
Albumin: 3.8 g/dL (ref 3.5–5.0)
Alkaline Phosphatase: 304 U/L — ABNORMAL HIGH (ref 38–126)
Anion gap: 6 (ref 5–15)
BUN: 29 mg/dL — ABNORMAL HIGH (ref 6–20)
CO2: 27 mmol/L (ref 22–32)
Calcium: 9.9 mg/dL (ref 8.9–10.3)
Chloride: 102 mmol/L (ref 101–111)
Creatinine, Ser: 1.53 mg/dL — ABNORMAL HIGH (ref 0.44–1.00)
GFR calc Af Amer: 47 mL/min — ABNORMAL LOW (ref >60)
GFR calc non Af Amer: 40 mL/min — ABNORMAL LOW (ref >60)
Glucose, Bld: 168 mg/dL — ABNORMAL HIGH (ref 65–99)
Potassium: 4 mmol/L (ref 3.5–5.1)
Sodium: 135 mmol/L (ref 135–145)
Total Bilirubin: 2.3 mg/dL — ABNORMAL HIGH (ref 0.3–1.2)
Total Protein: 7.9 g/dL (ref 6.5–8.1)

## 2015-09-06 LAB — CBC
HCT: 30.8 % — ABNORMAL LOW (ref 36.0–46.0)
Hemoglobin: 10.2 g/dL — ABNORMAL LOW (ref 12.0–15.0)
MCH: 28.4 pg (ref 26.0–34.0)
MCHC: 33.1 g/dL (ref 30.0–36.0)
MCV: 85.8 fL (ref 78.0–100.0)
Platelets: 353 10*3/uL (ref 150–400)
RBC: 3.59 MIL/uL — ABNORMAL LOW (ref 3.87–5.11)
RDW: 12.6 % (ref 11.5–15.5)
WBC: 12.8 10*3/uL — ABNORMAL HIGH (ref 4.0–10.5)

## 2015-09-06 LAB — CREATININE, SERUM
Creatinine, Ser: 1.33 mg/dL — ABNORMAL HIGH (ref 0.44–1.00)
GFR calc Af Amer: 55 mL/min — ABNORMAL LOW (ref >60)
GFR calc non Af Amer: 48 mL/min — ABNORMAL LOW (ref >60)

## 2015-09-06 LAB — RAPID URINE DRUG SCREEN, HOSP PERFORMED
Amphetamines: NOT DETECTED
Barbiturates: NOT DETECTED
Benzodiazepines: NOT DETECTED
Cocaine: NOT DETECTED
Opiates: NOT DETECTED
Tetrahydrocannabinol: NOT DETECTED

## 2015-09-06 LAB — CBG MONITORING, ED
Glucose-Capillary: 109 mg/dL — ABNORMAL HIGH (ref 65–99)
Glucose-Capillary: 244 mg/dL — ABNORMAL HIGH (ref 65–99)

## 2015-09-06 LAB — CBC WITH DIFFERENTIAL/PLATELET
Basophils Absolute: 0 10*3/uL (ref 0.0–0.1)
Basophils Relative: 0 %
Eosinophils Absolute: 0.2 10*3/uL (ref 0.0–0.7)
Eosinophils Relative: 1 %
HCT: 31.2 % — ABNORMAL LOW (ref 36.0–46.0)
Hemoglobin: 10.3 g/dL — ABNORMAL LOW (ref 12.0–15.0)
Lymphocytes Relative: 9 %
Lymphs Abs: 1.2 10*3/uL (ref 0.7–4.0)
MCH: 28.5 pg (ref 26.0–34.0)
MCHC: 33 g/dL (ref 30.0–36.0)
MCV: 86.4 fL (ref 78.0–100.0)
Monocytes Absolute: 1.2 10*3/uL — ABNORMAL HIGH (ref 0.1–1.0)
Monocytes Relative: 9 %
Neutro Abs: 10.5 10*3/uL — ABNORMAL HIGH (ref 1.7–7.7)
Neutrophils Relative %: 81 %
Platelets: 342 10*3/uL (ref 150–400)
RBC: 3.61 MIL/uL — ABNORMAL LOW (ref 3.87–5.11)
RDW: 12.7 % (ref 11.5–15.5)
WBC: 13 10*3/uL — ABNORMAL HIGH (ref 4.0–10.5)

## 2015-09-06 LAB — URINE MICROSCOPIC-ADD ON: RBC / HPF: NONE SEEN RBC/hpf (ref 0–5)

## 2015-09-06 LAB — GLUCOSE, CAPILLARY: Glucose-Capillary: 215 mg/dL — ABNORMAL HIGH (ref 65–99)

## 2015-09-06 LAB — URINALYSIS, ROUTINE W REFLEX MICROSCOPIC
Bilirubin Urine: NEGATIVE
Glucose, UA: NEGATIVE mg/dL
Hgb urine dipstick: NEGATIVE
Ketones, ur: NEGATIVE mg/dL
Nitrite: NEGATIVE
Protein, ur: NEGATIVE mg/dL
Specific Gravity, Urine: <1.005 — ABNORMAL LOW (ref 1.005–1.030)
pH: 6.5 (ref 5.0–8.0)

## 2015-09-06 LAB — AMMONIA: Ammonia: 17 umol/L (ref 9–35)

## 2015-09-06 LAB — ETHANOL: Alcohol, Ethyl (B): <5 mg/dL (ref <5)

## 2015-09-06 LAB — LIPASE, BLOOD: Lipase: 17 U/L (ref 11–51)

## 2015-09-06 MED ORDER — GABAPENTIN 300 MG PO CAPS
300.0000 mg | ORAL_CAPSULE | Freq: Two times a day (BID) | ORAL | Status: DC
  Administered 2015-09-06 – 2015-09-09 (×6): 300 mg via ORAL
  Filled 2015-09-06 (×6): qty 1

## 2015-09-06 MED ORDER — DULOXETINE HCL 60 MG PO CPEP
60.0000 mg | ORAL_CAPSULE | Freq: Every day | ORAL | Status: DC
  Administered 2015-09-07 – 2015-09-08 (×2): 60 mg via ORAL
  Filled 2015-09-06 (×2): qty 1

## 2015-09-06 MED ORDER — ACETAMINOPHEN 325 MG PO TABS
650.0000 mg | ORAL_TABLET | Freq: Four times a day (QID) | ORAL | Status: DC | PRN
Start: 2015-09-06 — End: 2015-09-09

## 2015-09-06 MED ORDER — BENZTROPINE MESYLATE 1 MG PO TABS
1.0000 mg | ORAL_TABLET | Freq: Every day | ORAL | Status: DC
  Administered 2015-09-06 – 2015-09-08 (×3): 1 mg via ORAL
  Filled 2015-09-06 (×3): qty 1

## 2015-09-06 MED ORDER — LITHIUM CARBONATE 150 MG PO CAPS
900.0000 mg | ORAL_CAPSULE | Freq: Every evening | ORAL | Status: DC
  Administered 2015-09-06: 900 mg via ORAL
  Filled 2015-09-06: qty 6

## 2015-09-06 MED ORDER — ASPIRIN EC 81 MG PO TBEC
81.0000 mg | DELAYED_RELEASE_TABLET | Freq: Every day | ORAL | Status: DC
  Administered 2015-09-07 – 2015-09-09 (×3): 81 mg via ORAL
  Filled 2015-09-06 (×3): qty 1

## 2015-09-06 MED ORDER — STERILE WATER FOR INJECTION IJ SOLN
INTRAMUSCULAR | Status: AC
  Filled 2015-09-06: qty 10

## 2015-09-06 MED ORDER — SODIUM CHLORIDE 0.9 % IV SOLN
INTRAVENOUS | Status: DC
  Administered 2015-09-07 – 2015-09-08 (×3): via INTRAVENOUS

## 2015-09-06 MED ORDER — LITHIUM CARBONATE 150 MG PO CAPS
600.0000 mg | ORAL_CAPSULE | Freq: Every morning | ORAL | Status: DC
  Administered 2015-09-07: 600 mg via ORAL
  Filled 2015-09-06: qty 4

## 2015-09-06 MED ORDER — ENOXAPARIN SODIUM 40 MG/0.4ML ~~LOC~~ SOLN
40.0000 mg | SUBCUTANEOUS | Status: DC
  Administered 2015-09-06 – 2015-09-08 (×3): 40 mg via SUBCUTANEOUS
  Filled 2015-09-06 (×3): qty 0.4

## 2015-09-06 MED ORDER — ALBUTEROL SULFATE HFA 108 (90 BASE) MCG/ACT IN AERS
2.0000 | INHALATION_SPRAY | Freq: Four times a day (QID) | RESPIRATORY_TRACT | Status: DC | PRN
  Filled 2015-09-06: qty 6.7

## 2015-09-06 MED ORDER — ONDANSETRON HCL 4 MG PO TABS: 4.0000 mg | ORAL_TABLET | Freq: Four times a day (QID) | ORAL | Status: DC | PRN

## 2015-09-06 MED ORDER — DIATRIZOATE MEGLUMINE & SODIUM 66-10 % PO SOLN
ORAL | Status: AC
  Filled 2015-09-06: qty 30

## 2015-09-06 MED ORDER — LEVOTHYROXINE SODIUM 25 MCG PO TABS
25.0000 ug | ORAL_TABLET | Freq: Every day | ORAL | Status: DC
  Administered 2015-09-07 – 2015-09-09 (×3): 25 ug via ORAL
  Filled 2015-09-06 (×3): qty 1

## 2015-09-06 MED ORDER — SODIUM CHLORIDE 0.9 % IV BOLUS (SEPSIS)
1000.0000 mL | Freq: Once | INTRAVENOUS | Status: AC
  Administered 2015-09-06: 1000 mL via INTRAVENOUS

## 2015-09-06 MED ORDER — ACETAMINOPHEN 650 MG RE SUPP: 650.0000 mg | Freq: Four times a day (QID) | RECTAL | Status: DC | PRN

## 2015-09-06 MED ORDER — INSULIN ASPART 100 UNIT/ML ~~LOC~~ SOLN
0.0000 [IU] | Freq: Three times a day (TID) | SUBCUTANEOUS | Status: DC
  Administered 2015-09-07: 7 [IU] via SUBCUTANEOUS
  Administered 2015-09-07: 3 [IU] via SUBCUTANEOUS
  Administered 2015-09-07: 5 [IU] via SUBCUTANEOUS
  Administered 2015-09-08: 3 [IU] via SUBCUTANEOUS
  Administered 2015-09-08: 5 [IU] via SUBCUTANEOUS
  Administered 2015-09-08: 1 [IU] via SUBCUTANEOUS
  Administered 2015-09-09: 3 [IU] via SUBCUTANEOUS

## 2015-09-06 MED ORDER — ZIPRASIDONE MESYLATE 20 MG IM SOLR
INTRAMUSCULAR | Status: AC
  Filled 2015-09-06: qty 20

## 2015-09-06 MED ORDER — CEFTRIAXONE SODIUM 1 G IJ SOLR
1.0000 g | INTRAMUSCULAR | Status: DC
  Administered 2015-09-07 – 2015-09-08 (×2): 1 g via INTRAVENOUS
  Filled 2015-09-06 (×3): qty 10

## 2015-09-06 MED ORDER — RISPERIDONE 1 MG PO TABS
3.0000 mg | ORAL_TABLET | Freq: Every day | ORAL | Status: DC
  Administered 2015-09-06 – 2015-09-08 (×3): 3 mg via ORAL
  Filled 2015-09-06 (×3): qty 3

## 2015-09-06 MED ORDER — LORAZEPAM 2 MG/ML IJ SOLN: 1.0000 mg | Freq: Four times a day (QID) | INTRAMUSCULAR | Status: DC | PRN

## 2015-09-06 MED ORDER — TRAZODONE HCL 50 MG PO TABS
200.0000 mg | ORAL_TABLET | Freq: Every day | ORAL | Status: DC
  Administered 2015-09-06 – 2015-09-08 (×3): 200 mg via ORAL
  Filled 2015-09-06 (×3): qty 4

## 2015-09-06 MED ORDER — ATORVASTATIN CALCIUM 10 MG PO TABS: 10.0000 mg | ORAL_TABLET | Freq: Every day | ORAL | Status: DC

## 2015-09-06 MED ORDER — SODIUM CHLORIDE 0.9 % IV BOLUS (SEPSIS)
500.0000 mL | Freq: Once | INTRAVENOUS | Status: AC
  Administered 2015-09-06: 500 mL via INTRAVENOUS

## 2015-09-06 MED ORDER — DEXTROSE 5 % IV SOLN
1.0000 g | Freq: Once | INTRAVENOUS | Status: AC
  Administered 2015-09-06: 1 g via INTRAVENOUS
  Filled 2015-09-06: qty 10

## 2015-09-06 MED ORDER — PANTOPRAZOLE SODIUM 40 MG PO TBEC
40.0000 mg | DELAYED_RELEASE_TABLET | Freq: Every day | ORAL | Status: DC
  Administered 2015-09-07 – 2015-09-09 (×3): 40 mg via ORAL
  Filled 2015-09-06 (×3): qty 1

## 2015-09-06 MED ORDER — ONDANSETRON HCL 4 MG/2ML IJ SOLN
4.0000 mg | Freq: Four times a day (QID) | INTRAMUSCULAR | Status: DC | PRN
  Administered 2015-09-07: 4 mg via INTRAVENOUS
  Filled 2015-09-06: qty 2

## 2015-09-06 MED ORDER — ALBUTEROL SULFATE (2.5 MG/3ML) 0.083% IN NEBU: 3.0000 mL | INHALATION_SOLUTION | Freq: Four times a day (QID) | RESPIRATORY_TRACT | Status: DC | PRN

## 2015-09-06 MED ORDER — ZIPRASIDONE MESYLATE 20 MG IM SOLR
10.0000 mg | Freq: Once | INTRAMUSCULAR | Status: AC
  Administered 2015-09-06: 10 mg via INTRAMUSCULAR

## 2015-09-06 NOTE — ED Notes (Signed)
When I entered room to start IV, pt lying with eyes closed and will not respond to verbal stimuli.  Lifted with sheet to place xray board behind pt and pt maintained full control over head and neck.  Pt then woke up crying and sobbing uncontrollably but would not verbalize any complaints.  Primary nurse made aware.

## 2015-09-06 NOTE — ED Notes (Signed)
Pt's family member comes out to the nurses station demanding ice chips so that "she is will calm down. Y'all need to get me this right now." Family member made aware the nurse will ask the doctor for this.

## 2015-09-06 NOTE — ED Notes (Signed)
abd distended but soft. No pain with palpation. Mm wet

## 2015-09-06 NOTE — ED Notes (Signed)
MD at bedside. 

## 2015-09-06 NOTE — H&P (Signed)
PCP:   [name], MD   Chief Complaint:  Agitation  HPI:  45 year old female who  has a past medical history of Diabetes mellitus; Bipolar disorder ([place]); Depression; Anxiety; Hypertension; Anemia; and Schizoaffective disorder ([place]). Today was brought to the hospital by family for agitation for past 3 days. Patient at this time agitated and not providing significant history. But she complains of abdominal pain and pain in the arms. As per family she has not been eating and drinking for the past 3 days. Lab work in the ED revealed dehydration with creatinine 1.53. Her baseline creatinine 0.83. Also patient found to have elevated liver enzymes with alkaline phosphatase 304, AST 64, ALT 153. CT abdomen pelvis was done which did not show significant abnormality. Patient is status post cholecystectomy. She underwent EGD in March for abdominal pain, biopsy of the stomach mucosa revealed chronic gastritis. In the ED patient has been agitated, and did not want to be admitted. Patient was given Geodon by the ED physician.  Allergies:  No Known Allergies    Past Medical History  Diagnosis Date  . Diabetes mellitus   . Bipolar disorder ([place])   . Depression   . Anxiety   . Hypertension   . Anemia   . Schizoaffective disorder [facility])     Past Surgical History  Procedure Laterality Date  . Cesarean section    . Tubal ligation      X3  . Cholecystectomy  06/02/2012    Procedure: LAPAROSCOPIC CHOLECYSTECTOMY;  Surgeon: Jamesetta So, MD;  Location: [facility];  Service: General;  Laterality: N/A;  Attempted laparoscopic cholecystectomy  . Cholecystectomy  06/02/2012    Procedure: CHOLECYSTECTOMY;  Surgeon: Jamesetta So, MD;  Location: [facility];  Service: General;  Laterality: N/A;  converted to open at  0905  . Breast surgery    . Esophagogastroduodenoscopy (egd) with propofol N/A 08/24/2015    Procedure: ESOPHAGOGASTRODUODENOSCOPY (EGD) WITH PROPOFOL;  Surgeon: Rogene Houston, MD;  Location: [facility];  Service: Endoscopy;  Laterality: N/A;  1:10 - Ann to notify pt to arrive at 11:30    Prior to Admission medications   Medication Sig Start Date End Date Taking? Authorizing Provider  acarbose (PRECOSE) 100 MG tablet Take 100 mg by mouth daily.   Yes Historical Provider, MD  albuterol (PROVENTIL HFA;VENTOLIN HFA) 108 (90 BASE) MCG/ACT inhaler Inhale 2 puffs into the lungs every 6 (six) hours as needed for wheezing.   Yes Historical Provider, MD  aspirin EC 81 MG tablet Take 81 mg by mouth daily.   Yes Historical Provider, MD  atorvastatin (LIPITOR) 10 MG tablet Take 1 tablet by mouth daily. 05/04/15  Yes Historical Provider, MD  benztropine (COGENTIN) 1 MG tablet Take 1 tablet (1 mg total) by mouth at bedtime. 07/06/15 07/05/16 Yes Cloria Spring, MD  DULoxetine (CYMBALTA) 60 MG capsule Take 1 capsule (60 mg total) by mouth daily at 6 PM. 07/06/15  Yes Cloria Spring, MD  gabapentin (NEURONTIN) 300 MG capsule Take 300 mg by mouth 2 (two) times daily. 03/28/15  Yes Historical Provider, MD  ibuprofen (ADVIL,MOTRIN) 800 MG tablet Take 800 mg by mouth every 8 (eight) hours as needed.   Yes Historical Provider, MD  insulin lispro (HUMALOG) 100 UNIT/ML injection Inject into the skin 3 (three) times daily before meals.   Yes Historical Provider, MD  levothyroxine (SYNTHROID, LEVOTHROID) 25 MCG tablet Take 25 mcg by mouth daily before breakfast.  04/15/15  Yes Historical Provider,  MD  Linaclotide (LINZESS) 145 MCG CAPS capsule Take 145 mcg by mouth daily. Reported on 08/22/2015   Yes Historical Provider, MD  lisinopril (PRINIVIL,ZESTRIL) 10 MG tablet Take 1 tablet by mouth daily. 05/04/15  Yes Historical Provider, MD  lithium carbonate 300 MG capsule Take 2 capsules in the am and 3 at bedtime 07/06/15  Yes Cloria Spring, MD  metFORMIN (GLUCOPHAGE) 1000 MG tablet Take 1,000 mg by mouth 2 (two) times daily. 04/20/13  Yes Historical Provider, MD  methocarbamol (ROBAXIN) 500 MG tablet Take 1 tablet by mouth  as needed.  05/04/15  Yes Historical Provider, MD  omeprazole (PRILOSEC) 20 MG capsule Take 20 mg by mouth daily.   Yes Historical Provider, MD  ondansetron (ZOFRAN) 4 MG tablet Take 1 tablet (4 mg total) by mouth 2 (two) times daily as needed for nausea or vomiting. 08/24/15  Yes Rogene Houston, MD  pantoprazole (PROTONIX) 40 MG tablet Take 1 tablet (40 mg total) by mouth daily. 08/22/15  Yes Butch Penny, NP  pioglitazone (ACTOS) 15 MG tablet Take 15 mg by mouth daily.   Yes Historical Provider, MD  risperiDONE (RISPERDAL) 3 MG tablet Take 1 tablet (3 mg total) by mouth at bedtime. 07/06/15 07/05/16 Yes Cloria Spring, MD  TOUJEO SOLOSTAR 300 UNIT/ML SOPN Inject 70 Units into the skin at bedtime.  04/15/15  Yes Historical Provider, MD  traMADol (ULTRAM) 50 MG tablet Take 1 tablet by mouth daily as needed. 05/04/15  Yes Historical Provider, MD  traZODone (DESYREL) 100 MG tablet Take 2 tablets (200 mg total) by mouth at bedtime. 07/06/15  Yes Cloria Spring, MD  Vitamin D, Ergocalciferol, (DRISDOL) 50000 units CAPS capsule Take 50,000 Units by mouth every 7 (seven) days.   Yes Historical Provider, MD  sucralfate (CARAFATE) 1 GM/10ML suspension Take 10 mLs (1 g total) by mouth 4 (four) times daily. Patient not taking: Reported on 09/06/2015 08/30/15   Butch Penny, NP    Social History:  reports that she has never smoked. She has never used smokeless tobacco. She reports that she does not drink alcohol or use illicit drugs.  Family History  Problem Relation Age of Onset  . Stroke Other   . Diabetes Other   . Cancer Other   . Seizures Other   . Hypertension Mother   . Alcohol abuse Mother   . Hypertension Father   . Alcohol abuse Sister   . Alcohol abuse Maternal Aunt   . Alcohol abuse Paternal Aunt   . Alcohol abuse Maternal Grandfather   . Alcohol abuse Maternal Grandmother   . Alcohol abuse Kendra Opitz Weights   09/06/15 1132  Weight: 68.04 kg (150 lb)      Review of  Systems:  Unobtainable at this time   Physical Exam: Blood pressure 148/83, pulse 108, temperature 98.8 F (37.1 C), temperature source Oral, resp. rate 22, weight 68.04 kg (150 lb), last menstrual period 01/23/2013, SpO2 99 %. Constitutional:   Patient is a well-developed and well-nourished who is agitated and not cooperative with exam. Physical exam could not be completed due to patient's ongoing agitation.  Labs on Admission:  Basic Metabolic Panel:  Recent Labs Lab 09/06/15 1138  NA 135  K 4.0  CL 102  CO2 27  GLUCOSE 168*  BUN 29*  CREATININE 1.53*  CALCIUM 9.9   Liver Function Tests:  Recent Labs Lab 09/06/15 1138  AST 64*  ALT 153*  ALKPHOS 304*  BILITOT 2.3*  PROT 7.9  ALBUMIN 3.8   No results for input(s): LIPASE, AMYLASE in the last 168 hours.  Recent Labs Lab 09/06/15 1522  AMMONIA 17   CBC:  Recent Labs Lab 09/06/15 1138  WBC 13.0*  NEUTROABS 10.5*  HGB 10.3*  HCT 31.2*  MCV 86.4  PLT 342    CBG:  Recent Labs Lab 09/06/15 1133  GLUCAP 109*    Radiological Exams on Admission: Ct Abdomen Pelvis Wo Contrast  09/06/2015  CLINICAL DATA:  Elevated LFT. EXAM: CT ABDOMEN AND PELVIS WITHOUT CONTRAST TECHNIQUE: Multidetector CT imaging of the abdomen and pelvis was performed following the standard protocol without IV contrast. COMPARISON:  CT abdomen pelvis 05/30/2015 FINDINGS: Lower chest: Lung bases clear. Mild pericardial thickening/ fluid slightly improved from the prior study. Hepatobiliary: Normal unenhanced liver. Cholecystectomy. Bile ducts nondilated. Pancreas: Negative Spleen: Negative Adrenals/Urinary Tract: Negative for renal mass or obstruction. No urinary tract calculi. Multiple calcified phleboliths in the retroperitoneum on the left. Mild bladder wall thickening. Stomach/Bowel: Negative for bowel obstruction. No bowel edema. Negative for diverticulitis. Appendix not visualized. Vascular/Lymphatic: Normal aorta and IVC. Negative for  lymphadenopathy. Reproductive: Normal uterus. Fallopian tube clip on the left. No clip on the right side. No pelvic mass Other: No free-fluid Musculoskeletal: No acute skeletal abnormality. IMPRESSION: Postop cholecystectomy.  No biliary duct dilatation. Mild bladder wall thickening Pericardial thickening/ effusion improved since the prior study. Electronically Signed   By: Franchot Gallo M.D.   On: 09/06/2015 16:13   Ct Head Wo Contrast  09/06/2015  CLINICAL DATA:  Decreased level of consciousness and slurred speech EXAM: CT HEAD WITHOUT CONTRAST TECHNIQUE: Contiguous axial images were obtained from the base of the skull through the vertex without intravenous contrast. COMPARISON:  08/02/2015 FINDINGS: The bony calvarium is intact. Air-fluid levels are again seen in the sphenoid sinus stable from the prior exam. No findings to suggest acute hemorrhage, acute infarction or space-occupying mass lesion are noted. IMPRESSION: Inflammatory changes in the sphenoid sinus stable from the prior exam. No acute intracranial abnormality noted. Electronically Signed   By: Inez Catalina M.D.   On: 09/06/2015 13:21   Dg Chest Port 1 View  09/06/2015  CLINICAL DATA:  Altered mental status. EXAM: PORTABLE CHEST 1 VIEW COMPARISON:  06/07/2015 FINDINGS: Hypoventilation. Decreased lung volume with bibasilar atelectasis. Negative for heart failure or pneumonia. No effusion IMPRESSION: Hypoventilation with bibasilar atelectasis. Electronically Signed   By: Franchot Gallo M.D.   On: 09/06/2015 12:01    EKG: Independently reviewed. Normal sinus rhythm   Assessment/Plan Active Problems:   Diabetes (H[facility])   Elevated liver enzymes   AKI (acute kidney injury) ([place])   Agitation Patient has history of schizoaffective disorder, she is on multiple psych medications. Continue all the medications. Once patient is medically stable she will need psychiatric consultation. Will start Ativan 1 mg every 6 hours when necessary for  anxiety  AK I Patient has mild acute kidney injury, will start gentle IV hydration with normal saline Follow BMP in a.m.  Transaminitis Patient has elevated liver enzymes, she status post cholecystectomy Will check lipase Consults GI in a.m.  ? UTI Patient has mildly abnormal UA, CT scan abdomen showed bladder wall thickening. Patient empirically started on ceftriaxone. Follow urine culture results.  DVT prophylaxis Lovenox   Code status: Full code  Family discussion: Admission, patients condition and plan of care including tests being ordered have been discussed with the patient's daughter and mother at bedside  Time Spent on Admission: 60 min  [place] [facility] Pager: [phone] 09/06/2015, 5:04 PM  If 7PM-7AM, please contact night-coverage  [email or web address]  Password [email or web address]

## 2015-09-06 NOTE — Discharge Instructions (Signed)
You were dehydrated. Drink plenty of fluids and eat regular meals. Return if worse.

## 2015-09-06 NOTE — ED Notes (Signed)
Pt has been trying to get out of bed. Advised pt needing to start an IV.  Pt screaming while husband in room . Husband stepped out and pt did not move or scream with IV stick. Hospitalist now  With pt.

## 2015-09-06 NOTE — ED Provider Notes (Signed)
CSN: [id number]     Arrival date & time 09/06/15  1122 History   First MD Initiated Contact with Patient 09/06/15 1134     Chief Complaint  Patient presents with  . Altered Mental Status     (Consider location/radiation/quality/duration/timing/severity/associated sxs/prior Treatment) HPI..... Level V caveat for altered mental status. Family reports patient has mental health problems; however, she is normally active with her family. The past 2 days she has been not feeling well. Not eating. Not drinking. No fever, sweats, chills, chest pain, dyspnea, dysuria, abdominal pain.  Family reports this is not her typical behavior.  Past Medical History  Diagnosis Date  . Diabetes mellitus   . Bipolar disorder ([place])   . Depression   . Anxiety   . Hypertension   . Anemia   . Schizoaffective disorder [facility])    Past Surgical History  Procedure Laterality Date  . Cesarean section    . Tubal ligation      X3  . Cholecystectomy  06/02/2012    Procedure: LAPAROSCOPIC CHOLECYSTECTOMY;  Surgeon: Jamesetta So, MD;  Location: [facility] ORS;  Service: General;  Laterality: N/A;  Attempted laparoscopic cholecystectomy  . Cholecystectomy  06/02/2012    Procedure: CHOLECYSTECTOMY;  Surgeon: Jamesetta So, MD;  Location: [facility] ORS;  Service: General;  Laterality: N/A;  converted to open at  0905  . Breast surgery    . Esophagogastroduodenoscopy (egd) with propofol N/A 08/24/2015    Procedure: ESOPHAGOGASTRODUODENOSCOPY (EGD) WITH PROPOFOL;  Surgeon: Rogene Houston, MD;  Location: [facility] ENDO SU[facility];  Service: Endoscopy;  Laterality: N/A;  1:10 - Ann to notify pt to arrive at 11:30   Family History  Problem Relation Age of Onset  . Stroke Other   . Diabetes Other   . Cancer Other   . Seizures Other   . Hypertension Mother   . Alcohol abuse Mother   . Hypertension Father   . Alcohol abuse Sister   . Alcohol abuse Maternal Aunt   . Alcohol abuse Paternal Aunt   . Alcohol abuse Maternal Grandfather   . Alcohol  abuse Maternal Grandmother   . Alcohol abuse Cousin    Social History  Substance Use Topics  . Smoking status: Never Smoker   . Smokeless tobacco: Never Used  . Alcohol Use: No   OB History    Gravida Para Term Preterm AB TAB SAB Ectopic Multiple Living   3 3 3       3      Review of Systems  All other systems reviewed and are negative.     Allergies  Review of patient's allergies indicates no known allergies.  Home Medications   Prior to Admission medications   Medication Sig Start Date End Date Taking? Authorizing Provider  acarbose (PRECOSE) 100 MG tablet Take 100 mg by mouth daily.   Yes Historical Provider, MD  albuterol (PROVENTIL HFA;VENTOLIN HFA) 108 (90 BASE) MCG/ACT inhaler Inhale 2 puffs into the lungs every 6 (six) hours as needed for wheezing.   Yes Historical Provider, MD  aspirin EC 81 MG tablet Take 81 mg by mouth daily.   Yes Historical Provider, MD  atorvastatin (LIPITOR) 10 MG tablet Take 1 tablet by mouth daily. 05/04/15  Yes Historical Provider, MD  benztropine (COGENTIN) 1 MG tablet Take 1 tablet (1 mg total) by mouth at bedtime. 07/06/15 07/05/16 Yes Cloria Spring, MD  DULoxetine (CYMBALTA) 60 MG capsule Take 1 capsule (60 mg total) by mouth daily at 6 PM. 07/06/15  Yes Cloria Spring, MD  gabapentin (NEURONTIN) 300 MG capsule Take 300 mg by mouth 2 (two) times daily. 03/28/15  Yes Historical Provider, MD  ibuprofen (ADVIL,MOTRIN) 800 MG tablet Take 800 mg by mouth every 8 (eight) hours as needed.   Yes Historical Provider, MD  insulin lispro (HUMALOG) 100 UNIT/ML injection Inject into the skin 3 (three) times daily before meals.   Yes Historical Provider, MD  levothyroxine (SYNTHROID, LEVOTHROID) 25 MCG tablet Take 25 mcg by mouth daily before breakfast.  04/15/15  Yes Historical Provider, MD  Linaclotide (LINZESS) 145 MCG CAPS capsule Take 145 mcg by mouth daily. Reported on 08/22/2015   Yes Historical Provider, MD  lisinopril (PRINIVIL,ZESTRIL) 10 MG tablet  Take 1 tablet by mouth daily. 05/04/15  Yes Historical Provider, MD  lithium carbonate 300 MG capsule Take 2 capsules in the am and 3 at bedtime 07/06/15  Yes Cloria Spring, MD  metFORMIN (GLUCOPHAGE) 1000 MG tablet Take 1,000 mg by mouth 2 (two) times daily. 04/20/13  Yes Historical Provider, MD  methocarbamol (ROBAXIN) 500 MG tablet Take 1 tablet by mouth as needed.  05/04/15  Yes Historical Provider, MD  omeprazole (PRILOSEC) 20 MG capsule Take 20 mg by mouth daily.   Yes Historical Provider, MD  ondansetron (ZOFRAN) 4 MG tablet Take 1 tablet (4 mg total) by mouth 2 (two) times daily as needed for nausea or vomiting. 08/24/15  Yes Rogene Houston, MD  pantoprazole (PROTONIX) 40 MG tablet Take 1 tablet (40 mg total) by mouth daily. 08/22/15  Yes Butch Penny, NP  pioglitazone (ACTOS) 15 MG tablet Take 15 mg by mouth daily.   Yes Historical Provider, MD  risperiDONE (RISPERDAL) 3 MG tablet Take 1 tablet (3 mg total) by mouth at bedtime. 07/06/15 07/05/16 Yes Cloria Spring, MD  TOUJEO SOLOSTAR 300 UNIT/ML SOPN Inject 70 Units into the skin at bedtime.  04/15/15  Yes Historical Provider, MD  traMADol (ULTRAM) 50 MG tablet Take 1 tablet by mouth daily as needed. 05/04/15  Yes Historical Provider, MD  traZODone (DESYREL) 100 MG tablet Take 2 tablets (200 mg total) by mouth at bedtime. 07/06/15  Yes Cloria Spring, MD  Vitamin D, Ergocalciferol, (DRISDOL) 50000 units CAPS capsule Take 50,000 Units by mouth every 7 (seven) days.   Yes Historical Provider, MD  sucralfate (CARAFATE) 1 GM/10ML suspension Take 10 mLs (1 g total) by mouth 4 (four) times daily. Patient not taking: Reported on 09/06/2015 08/30/15   Butch Penny, NP   BP 154/81 mmHg  Pulse 110  Temp(Src) 98 F (36.7 C) (Oral)  Resp 18  Ht 5' (1.524 m)  Wt 154 lb 5.2 oz (70 kg)  BMI 30.14 kg/m2  SpO2 99%  LMP 01/23/2013 Physical Exam  Constitutional: She is oriented to person, place, and time.  Lethargic, will respond minimally to questions   HENT:  Head: Normocephalic and atraumatic.  Eyes: Conjunctivae and EOM are normal. Pupils are equal, round, and reactive to light.  Neck: Normal range of motion. Neck supple.  Cardiovascular: Normal rate and regular rhythm.   Pulmonary/Chest: Effort normal and breath sounds normal.  Abdominal: Soft. Bowel sounds are normal.  Nontender right upper quadrant  Musculoskeletal: Normal range of motion.  Neurological: She is alert and oriented to person, place, and time.  Skin: Skin is warm and dry.  Psychiatric: She has a normal mood and affect. Her behavior is normal.  Nursing note and vitals reviewed.   ED Course  Procedures (including  critical care time) Labs Review Labs Reviewed  CBC WITH DIFFERENTIAL/PLATELET - Abnormal; Notable for the following:    WBC 13.0 (*)    RBC 3.61 (*)    Hemoglobin 10.3 (*)    HCT 31.2 (*)    Neutro Abs 10.5 (*)    Monocytes Absolute 1.2 (*)    All other components within normal limits  COMPREHENSIVE METABOLIC PANEL - Abnormal; Notable for the following:    Glucose, Bld 168 (*)    BUN 29 (*)    Creatinine, Ser 1.53 (*)    AST 64 (*)    ALT 153 (*)    Alkaline Phosphatase 304 (*)    Total Bilirubin 2.3 (*)    GFR calc non Af Amer 40 (*)    GFR calc Af Amer 47 (*)    All other components within normal limits  URINALYSIS, ROUTINE W REFLEX MICROSCOPIC (NOT AT [facility]) - Abnormal; Notable for the following:    Specific Gravity, Urine <1.005 (*)    Leukocytes, UA SMALL (*)    All other components within normal limits  URINE MICROSCOPIC-ADD ON - Abnormal; Notable for the following:    Squamous Epithelial / LPF 6-30 (*)    Bacteria, UA FEW (*)    All other components within normal limits  CBC - Abnormal; Notable for the following:    WBC 12.8 (*)    RBC 3.59 (*)    Hemoglobin 10.2 (*)    HCT 30.8 (*)    All other components within normal limits  CBC - Abnormal; Notable for the following:    WBC 12.2 (*)    RBC 3.61 (*)    Hemoglobin 10.3 (*)     HCT 31.0 (*)    All other components within normal limits  COMPREHENSIVE METABOLIC PANEL - Abnormal; Notable for the following:    Glucose, Bld 257 (*)    Creatinine, Ser 1.02 (*)    Albumin 3.3 (*)    AST 48 (*)    ALT 128 (*)    Alkaline Phosphatase 317 (*)    Total Bilirubin 2.3 (*)    All other components within normal limits  CREATININE, SERUM - Abnormal; Notable for the following:    Creatinine, Ser 1.33 (*)    GFR calc non Af Amer 48 (*)    GFR calc Af Amer 55 (*)    All other components within normal limits  GLUCOSE, CAPILLARY - Abnormal; Notable for the following:    Glucose-Capillary 215 (*)    All other components within normal limits  GLUCOSE, CAPILLARY - Abnormal; Notable for the following:    Glucose-Capillary 244 (*)    All other components within normal limits  GLUCOSE, CAPILLARY - Abnormal; Notable for the following:    Glucose-Capillary 316 (*)    All other components within normal limits  CBG MONITORING, ED - Abnormal; Notable for the following:    Glucose-Capillary 109 (*)    All other components within normal limits  CBG MONITORING, ED - Abnormal; Notable for the following:    Glucose-Capillary 244 (*)    All other components within normal limits  URINE CULTURE  URINE RAPID DRUG SCREEN, HOSP PERFORMED  ETHANOL  AMMONIA  LIPASE, BLOOD    Imaging Review Ct Abdomen Pelvis Wo Contrast  09/06/2015  CLINICAL DATA:  Elevated LFT. EXAM: CT ABDOMEN AND PELVIS WITHOUT CONTRAST TECHNIQUE: Multidetector CT imaging of the abdomen and pelvis was performed following the standard protocol without IV contrast. COMPARISON:  CT abdomen  pelvis 05/30/2015 FINDINGS: Lower chest: Lung bases clear. Mild pericardial thickening/ fluid slightly improved from the prior study. Hepatobiliary: Normal unenhanced liver. Cholecystectomy. Bile ducts nondilated. Pancreas: Negative Spleen: Negative Adrenals/Urinary Tract: Negative for renal mass or obstruction. No urinary tract calculi.  Multiple calcified phleboliths in the retroperitoneum on the left. Mild bladder wall thickening. Stomach/Bowel: Negative for bowel obstruction. No bowel edema. Negative for diverticulitis. Appendix not visualized. Vascular/Lymphatic: Normal aorta and IVC. Negative for lymphadenopathy. Reproductive: Normal uterus. Fallopian tube clip on the left. No clip on the right side. No pelvic mass Other: No free-fluid Musculoskeletal: No acute skeletal abnormality. IMPRESSION: Postop cholecystectomy.  No biliary duct dilatation. Mild bladder wall thickening Pericardial thickening/ effusion improved since the prior study. Electronically Signed   By: Franchot Gallo M.D.   On: 09/06/2015 16:13   Ct Head Wo Contrast  09/06/2015  CLINICAL DATA:  Decreased level of consciousness and slurred speech EXAM: CT HEAD WITHOUT CONTRAST TECHNIQUE: Contiguous axial images were obtained from the base of the skull through the vertex without intravenous contrast. COMPARISON:  08/02/2015 FINDINGS: The bony calvarium is intact. Air-fluid levels are again seen in the sphenoid sinus stable from the prior exam. No findings to suggest acute hemorrhage, acute infarction or space-occupying mass lesion are noted. IMPRESSION: Inflammatory changes in the sphenoid sinus stable from the prior exam. No acute intracranial abnormality noted. Electronically Signed   By: Inez Catalina M.D.   On: 09/06/2015 13:21   Dg Chest Port 1 View  09/06/2015  CLINICAL DATA:  Altered mental status. EXAM: PORTABLE CHEST 1 VIEW COMPARISON:  06/07/2015 FINDINGS: Hypoventilation. Decreased lung volume with bibasilar atelectasis. Negative for heart failure or pneumonia. No effusion IMPRESSION: Hypoventilation with bibasilar atelectasis. Electronically Signed   By: Franchot Gallo M.D.   On: 09/06/2015 12:01   I have personally reviewed and evaluated these images and lab results as part of my medical decision-making.   EKG Interpretation   Date/Time:  Thursday September 06 2015 11:46:04 EDT Ventricular Rate:  94 PR Interval:  155 QRS Duration: 95 QT Interval:  326 QTC Calculation: 408 R Axis:   6 Text Interpretation:  Sinus rhythm Paired ventricular premature complexes  Aberrant conduction of SV complex(es) ED PHYSICIAN INTERPRETATION  AVAILABLE IN [facility] Confirmed by TEST, Record ([id number]) on  09/07/2015 7:27:30 AM      MDM   Final diagnoses:  Altered mental status, unspecified altered mental status type  Renal insufficiency    Uncertain etiology of patient's obtundation. She feels better after IV fluids, but is not back to her norm. Patient has a pan elevation of her liver functions and bilirubin. Questionable slight urinary tract infection. Will obtain CT of abdomen and pelvis and admit to general medicine. Discussed with Dr. Darrick Meigs and Dr Roderic Palau.    Nat Christen, MD 09/07/15 [phone]

## 2015-09-06 NOTE — ED Notes (Signed)
Husband reports pt has had decreased loc for the past 2 days.  Family says they think pt may be on too much medication.  Husband says she has "been in and out of it lately."  Pt has not been very verbal for the past few days but says when she talks, her speech is slurred.

## 2015-09-07 DIAGNOSIS — R451 Restlessness and agitation: Secondary | ICD-10-CM | POA: Diagnosis not present

## 2015-09-07 DIAGNOSIS — N179 Acute kidney failure, unspecified: Secondary | ICD-10-CM | POA: Diagnosis not present

## 2015-09-07 DIAGNOSIS — T56891A Toxic effect of other metals, accidental (unintentional), initial encounter: Secondary | ICD-10-CM | POA: Diagnosis present

## 2015-09-07 DIAGNOSIS — R4182 Altered mental status, unspecified: Secondary | ICD-10-CM | POA: Diagnosis present

## 2015-09-07 DIAGNOSIS — R748 Abnormal levels of other serum enzymes: Secondary | ICD-10-CM

## 2015-09-07 LAB — CBC
HCT: 31 % — ABNORMAL LOW (ref 36.0–46.0)
Hemoglobin: 10.3 g/dL — ABNORMAL LOW (ref 12.0–15.0)
MCH: 28.5 pg (ref 26.0–34.0)
MCHC: 33.2 g/dL (ref 30.0–36.0)
MCV: 85.9 fL (ref 78.0–100.0)
Platelets: 353 10*3/uL (ref 150–400)
RBC: 3.61 MIL/uL — ABNORMAL LOW (ref 3.87–5.11)
RDW: 12.7 % (ref 11.5–15.5)
WBC: 12.2 10*3/uL — ABNORMAL HIGH (ref 4.0–10.5)

## 2015-09-07 LAB — COMPREHENSIVE METABOLIC PANEL
ALT: 128 U/L — ABNORMAL HIGH (ref 14–54)
AST: 48 U/L — ABNORMAL HIGH (ref 15–41)
Albumin: 3.3 g/dL — ABNORMAL LOW (ref 3.5–5.0)
Alkaline Phosphatase: 317 U/L — ABNORMAL HIGH (ref 38–126)
Anion gap: 8 (ref 5–15)
BUN: 17 mg/dL (ref 6–20)
CO2: 24 mmol/L (ref 22–32)
Calcium: 9.4 mg/dL (ref 8.9–10.3)
Chloride: 105 mmol/L (ref 101–111)
Creatinine, Ser: 1.02 mg/dL — ABNORMAL HIGH (ref 0.44–1.00)
GFR calc Af Amer: >60 mL/min (ref >60)
GFR calc non Af Amer: >60 mL/min (ref >60)
Glucose, Bld: 257 mg/dL — ABNORMAL HIGH (ref 65–99)
Potassium: 3.8 mmol/L (ref 3.5–5.1)
Sodium: 137 mmol/L (ref 135–145)
Total Bilirubin: 2.3 mg/dL — ABNORMAL HIGH (ref 0.3–1.2)
Total Protein: 7.4 g/dL (ref 6.5–8.1)

## 2015-09-07 LAB — GLUCOSE, CAPILLARY
Glucose-Capillary: 244 mg/dL — ABNORMAL HIGH (ref 65–99)
Glucose-Capillary: 316 mg/dL — ABNORMAL HIGH (ref 65–99)
Glucose-Capillary: 289 mg/dL — ABNORMAL HIGH (ref 65–99)

## 2015-09-07 LAB — LITHIUM LEVEL: Lithium Lvl: 3.01 mmol/L (ref 0.60–1.20)

## 2015-09-07 LAB — CK: Total CK: 18 U/L — ABNORMAL LOW (ref 38–234)

## 2015-09-07 MED ORDER — LORAZEPAM 2 MG/ML IJ SOLN: 2.0000 mg | Freq: Four times a day (QID) | INTRAMUSCULAR | Status: DC | PRN

## 2015-09-07 MED ORDER — SODIUM CHLORIDE 0.9 % IV BOLUS (SEPSIS)
1000.0000 mL | INTRAVENOUS | Status: AC
  Administered 2015-09-07 (×2): 1000 mL via INTRAVENOUS

## 2015-09-07 NOTE — Progress Notes (Signed)
Lab called w/critical lithium value of 3.01.  Dr. Marin Comment notified.

## 2015-09-07 NOTE — Consult Note (Signed)
Referring Provider: No ref. provider found Primary Care Physician:  [name], MD Primary Gastroenterologist:  DR. Laural Golden  Reason for Consultation:  ELEVATED LIVER ENZYMES   Impression: ADMITTED WITH MENTAL STATUS CHANGES: AGITATION, NEW ONSET AND EVALUATION REVEALED ELEVATED LIVER ENZYME MIXED PATTERN  LIKELY DUE TO MEDS , LESS LIKELY OVERLAP SYNDROME, VIRAL HEPATITIS, OR STEATOHEPATITIS.  Plan: 1. SEROLOGIES FOR AIH/PBC 2. CONSIDER OPEN MRI ON [place] FOR MCRP IF LIVER ENZYME ABNORMALITY PERSISTS. 3. STOP LIPITOR AND IBUPROFEN.  4. PROTONIX DAILY.      HPI:  PT UNABLE TO PROVIDE DUE TO MENTAL STATUS CHANGES. FAMILY PRESENT BUT UNABLE TO GIVE ADDITIONAL HISTORY. EMR REVIEWED. PT Admitted with nausea/vomiting. Had a few new meds: IBUPROFEN, ?. STARTED WITH MENTAL STATUS CHANGES ON WED. STARTED  NEW MEDS BUT FAMILY DOESN'T KNOW WHICH ONE. LIPITOR IN BAG.   MED LIST FROM ED SHOWS NO LIPITOR NOV 2016. LIPITOR LIKELY STARTED DEC 2016. MED LIST FROM ED SHOWS LIPITOR JAN 2017.   Past Medical History  Diagnosis Date  . Diabetes mellitus   . Bipolar disorder ([place])   . Depression   . Anxiety   . Hypertension   . Anemia   . Schizoaffective disorder [facility])     Past Surgical History  Procedure Laterality Date  . Cesarean section    . Tubal ligation      X3  . Cholecystectomy  06/02/2012    Procedure: LAPAROSCOPIC CHOLECYSTECTOMY;  Surgeon: Jamesetta So, MD;  Location: [facility] ORS;  Service: General;  Laterality: N/A;  Attempted laparoscopic cholecystectomy  . Cholecystectomy  06/02/2012    Procedure: CHOLECYSTECTOMY;  Surgeon: Jamesetta So, MD;  Location: [facility] ORS;  Service: General;  Laterality: N/A;  converted to open at  0905  . Breast surgery    . Esophagogastroduodenoscopy (egd) with propofol N/A 08/24/2015    Procedure: ESOPHAGOGASTRODUODENOSCOPY (EGD) WITH PROPOFOL;  Surgeon: Rogene Houston, MD;  Location[facility];  Service: Endoscopy;  Laterality: N/A;  1:10 - Ann to notify pt  to arrive at 11:30    Prior to Admission medications   Medication Sig Start Date End Date Taking? Authorizing Provider  acarbose (PRECOSE) 100 MG tablet Take 100 mg by mouth daily.   Yes Historical Provider, MD  albuterol (PROVENTIL HFA;VENTOLIN HFA) 108 (90 BASE) MCG/ACT inhaler Inhale 2 puffs into the lungs every 6 (six) hours as needed for wheezing.   Yes Historical Provider, MD  aspirin EC 81 MG tablet Take 81 mg by mouth daily.   Yes Historical Provider, MD  atorvastatin (LIPITOR) 10 MG tablet Take 1 tablet by mouth daily. 05/04/15  Yes Historical Provider, MD  benztropine (COGENTIN) 1 MG tablet Take 1 tablet (1 mg total) by mouth at bedtime. 07/06/15 07/05/16 Yes Cloria Spring, MD  DULoxetine (CYMBALTA) 60 MG capsule Take 1 capsule (60 mg total) by mouth daily at 6 PM. 07/06/15  Yes Cloria Spring, MD  gabapentin (NEURONTIN) 300 MG capsule Take 300 mg by mouth 2 (two) times daily. 03/28/15  Yes Historical Provider, MD  ibuprofen (ADVIL,MOTRIN) 800 MG tablet Take 800 mg by mouth every 8 (eight) hours as needed.   Yes Historical Provider, MD  insulin lispro (HUMALOG) 100 UNIT/ML injection Inject into the skin 3 (three) times daily before meals.   Yes Historical Provider, MD  levothyroxine (SYNTHROID, LEVOTHROID) 25 MCG tablet Take 25 mcg by mouth daily before breakfast.  04/15/15  Yes Historical Provider, MD  Linaclotide (LINZESS) 145 MCG CAPS capsule Take 145 mcg by  mouth daily. Reported on 08/22/2015   Yes Historical Provider, MD  lisinopril (PRINIVIL,ZESTRIL) 10 MG tablet Take 1 tablet by mouth daily. 05/04/15  Yes Historical Provider, MD  lithium carbonate 300 MG capsule Take 2 capsules in the am and 3 at bedtime 07/06/15  Yes Cloria Spring, MD  metFORMIN (GLUCOPHAGE) 1000 MG tablet Take 1,000 mg by mouth 2 (two) times daily. 04/20/13  Yes Historical Provider, MD  methocarbamol (ROBAXIN) 500 MG tablet Take 1 tablet by mouth as needed.  05/04/15  Yes Historical Provider, MD  omeprazole (PRILOSEC)  20 MG capsule Take 20 mg by mouth daily.   Yes Historical Provider, MD  ondansetron (ZOFRAN) 4 MG tablet Take 1 tablet (4 mg total) by mouth 2 (two) times daily as needed for nausea or vomiting. 08/24/15  Yes Rogene Houston, MD  pantoprazole (PROTONIX) 40 MG tablet Take 1 tablet (40 mg total) by mouth daily. 08/22/15  Yes Butch Penny, NP  pioglitazone (ACTOS) 15 MG tablet Take 15 mg by mouth daily.   Yes Historical Provider, MD  risperiDONE (RISPERDAL) 3 MG tablet Take 1 tablet (3 mg total) by mouth at bedtime. 07/06/15 07/05/16 Yes Cloria Spring, MD  TOUJEO SOLOSTAR 300 UNIT/ML SOPN Inject 70 Units into the skin at bedtime.  04/15/15  Yes Historical Provider, MD  traMADol (ULTRAM) 50 MG tablet Take 1 tablet by mouth daily as needed. 05/04/15  Yes Historical Provider, MD  traZODone (DESYREL) 100 MG tablet Take 2 tablets (200 mg total) by mouth at bedtime. 07/06/15  Yes Cloria Spring, MD  Vitamin D, Ergocalciferol, (DRISDOL) 50000 units CAPS capsule Take 50,000 Units by mouth every 7 (seven) days.   Yes Historical Provider, MD  sucralfate (CARAFATE) 1 GM/10ML suspension Take 10 mLs (1 g total) by mouth 4 (four) times daily. Patient not taking: Reported on 09/06/2015 08/30/15   Butch Penny, NP    Current Facility-Administered Medications  Medication Dose Route Frequency Provider Last Rate Last Dose  . 0.9 %  sodium chloride infusion   Intravenous Continuous Oswald Hillock, MD 75 mL/hr at 09/06/15 1730    . acetaminophen (TYLENOL) tablet 650 mg  650 mg Oral Q6H PRN Oswald Hillock, MD       Or  . acetaminophen (TYLENOL) suppository 650 mg  650 mg Rectal Q6H PRN Oswald Hillock, MD      . albuterol (PROVENTIL) (2.5 MG/3ML) 0.083% nebulizer solution 3 mL  3 mL Inhalation Q6H PRN Oswald Hillock, MD      . aspirin EC tablet 81 mg  81 mg Oral Daily Oswald Hillock, MD   81 mg at 09/06/15 1730  . atorvastatin (LIPITOR) tablet 10 mg  10 mg Oral q1800 Oswald Hillock, MD   10 mg at 09/06/15 1800  . benztropine (COGENTIN)  tablet 1 mg  1 mg Oral QHS Oswald Hillock, MD   1 mg at 09/06/15 2223  . cefTRIAXone (ROCEPHIN) 1 g in dextrose 5 % 50 mL IVPB  1 g Intravenous Q24H Oswald Hillock, MD      . DULoxetine (CYMBALTA) DR capsule 60 mg  60 mg Oral q1800 Oswald Hillock, MD   60 mg at 09/06/15 1800  . enoxaparin (LOVENOX) injection 40 mg  40 mg Subcutaneous Q24H Oswald Hillock, MD   40 mg at 09/06/15 2221  . gabapentin (NEURONTIN) capsule 300 mg  300 mg Oral BID Oswald Hillock, MD   300 mg at 09/06/15 2222  .  insulin aspart (novoLOG) injection 0-9 Units  0-9 Units Subcutaneous TID WC Oswald Hillock, MD   3 Units at 09/07/15 [phone]  . levothyroxine (SYNTHROID, LEVOTHROID) tablet 25 mcg  25 mcg Oral QAC breakfast Oswald Hillock, MD   25 mcg at 09/07/15 [phone]  . lithium carbonate capsule 600 mg  600 mg Oral q morning - 10a Oswald Hillock, MD      . lithium carbonate capsule 900 mg  900 mg Oral QPM Oswald Hillock, MD   900 mg at 09/06/15 2222  . LORazepam (ATIVAN) injection 1 mg  1 mg Intravenous Q6H PRN Oswald Hillock, MD      . ondansetron (ZOFRAN) tablet 4 mg  4 mg Oral Q6H PRN Oswald Hillock, MD       Or  . ondansetron (ZOFRAN) injection 4 mg  4 mg Intravenous Q6H PRN Oswald Hillock, MD      . pantoprazole (PROTONIX) EC tablet 40 mg  40 mg Oral Daily Oswald Hillock, MD   40 mg at 09/06/15 1730  . risperiDONE (RISPERDAL) tablet 3 mg  3 mg Oral QHS Oswald Hillock, MD   3 mg at 09/06/15 2222  . traZODone (DESYREL) tablet 200 mg  200 mg Oral QHS Oswald Hillock, MD   200 mg at 09/06/15 2223    Allergies as of 09/06/2015  . (No Known Allergies)    Family History  Problem Relation Age of Onset  . Stroke Other   . Diabetes Other   . Cancer Other   . Seizures Other   . Hypertension Mother   . Alcohol abuse Mother   . Hypertension Father   . Alcohol abuse Sister   . Alcohol abuse Maternal Aunt   . Alcohol abuse Paternal Aunt   . Alcohol abuse Maternal Grandfather   . Alcohol abuse Maternal Grandmother   . Alcohol abuse Cousin      Social  History   Social History  . Marital Status: Married    Spouse Name: N/A  . Number of Children: N/A  . Years of Education: N/A   Occupational History  . Not on file.   Social History Main Topics  . Smoking status: Never Smoker   . Smokeless tobacco: Never Used  . Alcohol Use: No  . Drug Use: No  . Sexual Activity: Yes    Birth Control/ Protection: Surgical   Other Topics Concern  . Not on file   Social History Narrative    Review of Systems: PER HPI OTHERWISE ALL SYSTEMS ARE NEGATIVE.   Vitals: Blood pressure 154/81, pulse 110, temperature 98 F (36.7 C), temperature source Oral, resp. rate 18, height 5' (1.524 m), weight 154 lb 5.2 oz (70 kg), last menstrual period 01/23/2013, SpO2 99 %.  Physical Exam: General:   AROUSABLE, in NAD, FAMILY AT BEDSIDE Head:  Normocephalic and atraumatic. Eyes:  Sclera clear, no icterus.   Conjunctiva pink. Mouth:  No lesions, dentition ABnormal. Neck:  Supple; no masses. Lungs:  Clear throughout to auscultation.   No wheezes. No acute distress. Heart:  Regular rate and rhythm; no murmurs Abdomen:  Soft, nontender and nondistended. No masses noted. Normal bowel sounds, without guarding, and without rebound.   Msk:  Symmetrical without gross deformities. Normal posture. Extremities:  TRACE edema BILATERAL LEs. Neurologic:  AROUSABLE AND INTERACTIVE, NO  NEW FOCAL DEFICITS Cervical Nodes:  No significant cervical adenopathy. Psych:  AROUSABLE AND INTERACTIVE,   Lab Results:  Recent Labs  09/06/15 1138 09/06/15 1522 09/07/15 0607  WBC 13.0* 12.8* 12.2*  HGB 10.3* 10.2* 10.3*  HCT 31.2* 30.8* 31.0*  PLT 342 353 353   BMET  Recent Labs  09/06/15 1138 09/06/15 1533 09/07/15 0607  NA 135  --  137  K 4.0  --  3.8  CL 102  --  105  CO2 27  --  24  GLUCOSE 168*  --  257*  BUN 29*  --  17  CREATININE 1.53* 1.33* 1.02*  CALCIUM 9.9  --  9.4   LFT  Recent Labs  09/07/15 0607  PROT 7.4  ALBUMIN 3.3*  AST 48*  ALT  128*  ALKPHOS 317*  BILITOT 2.3*     Studies/Results: CT ABD/PELVIS W/O IVC MAR 13-NL CBD AND LIVER     Quinn Bartling  09/07/2015, 8:58 AM

## 2015-09-07 NOTE — Care Management Obs Status (Signed)
[facility] NOTIFICATION   Patient Details  Name: Kathryn [name] MRN: [id number] Date of Birth: 04-15-1971   Medicare Observation Status Notification Given:  Yes    Alvie Heidelberg, RN 09/07/2015, 1:02 PM

## 2015-09-07 NOTE — Consult Note (Signed)
Telepsych Consultation   Reason for Consult:  Agitation Referring Physician:  MD.Peter Marin Comment  Patient Identification: Kathryn [name] MRN:  [id number] Principal Diagnosis: Agitation Diagnosis:   Patient Active Problem List   Diagnosis Date Noted  . Agitation [R45.1] 09/07/2015  . Altered mental status [R41.82]   . Elevated liver enzymes [R74.8] 09/06/2015  . AKI (acute kidney injury) ([place]) [N17.9] 09/06/2015  . Diabetes ([place]) [E11.9] 08/22/2015  . Essential hypertension [I10] 08/22/2015  . Schizoaffective disorder ([place]) [F25.9] 02/08/2015  . PTSD (post-traumatic stress disorder) [F43.10] 02/08/2015  . Acute low back pain due to trauma [M54.5] 09/21/2012    Total Time spent with patient: 30 minutes  Subjective:   Kathryn [name] is a 45 y.o. female patient admitted with agitation. Patient is awake,alert and oriented  to person and place.  Patient appears, restless, agitated and irritable throughout this assessment. Patient states "I am here for belly pain."  patient denies suicidal or homicidal ideations. Denies auditory or visual hallucinations.  Patient family at bedside ( daughter) Reports that this is not my mothers normal behavior. States that her mother is paranoid at times. Family reports that patient has become increasing agitated since Monday. Per family daughter is followed by [facility]. Unsure of last appt. Patient daughter states that is unsure if patient has been taken medications. Support, Encouragement and Reassurance was provided.   HPI:-PER Progress Note:Kathryn [name] is an 45 y.o. female with hx of bipolar disorder, schizoaffective disorder, HTN, depression, anxiety, admitted for agitation, abdominal pain of unclear etiology, AKI, and possible UTI. She was given Geodon and given IVF, along with IV Rocephin after urine culture was obtained. She is better, but is still intermittently agitated. She was also given Ativan, and is slightly lethargic. She has hx of DM, HTN, and  anxiety as well   Past Psychiatric History: See Above  Risk to Self: Is patient at risk for suicide?: No Risk to Others:   Prior Inpatient Therapy:   Prior Outpatient Therapy:    Past Medical History:  Past Medical History  Diagnosis Date  . Diabetes mellitus   . Bipolar disorder ([place])   . Depression   . Anxiety   . Hypertension   . Anemia   . Schizoaffective disorder [facility])     Past Surgical History  Procedure Laterality Date  . Cesarean section    . Tubal ligation      X3  . Cholecystectomy  06/02/2012    Procedure: LAPAROSCOPIC CHOLECYSTECTOMY;  Surgeon: Jamesetta So, MD;  Location: [facility] ORS;  Service: General;  Laterality: N/A;  Attempted laparoscopic cholecystectomy  . Cholecystectomy  06/02/2012    Procedure: CHOLECYSTECTOMY;  Surgeon: Jamesetta So, MD;  Location: [facility] ORS;  Service: General;  Laterality: N/A;  converted to open at  0905  . Breast surgery    . Esophagogastroduodenoscopy (egd) with propofol N/A 08/24/2015    Procedure: ESOPHAGOGASTRODUODENOSCOPY (EGD) WITH PROPOFOL;  Surgeon: Rogene Houston, MD;  Location: [facility] END[facility] SUITE;  Service: Endoscopy;  Laterality: N/A;  1:10 - Ann to notify pt to arrive at 11:30   Family History:  Family History  Problem Relation Age of Onset  . Stroke Other   . Diabetes Other   . Cancer Other   . Seizures Other   . Hypertension Mother   . Alcohol abuse Mother   . Hypertension Father   . Alcohol abuse Sister   . Alcohol abuse Maternal Aunt   . Alcohol abuse Paternal Aunt   . Alcohol  abuse Maternal Grandfather   . Alcohol abuse Maternal Grandmother   . Alcohol abuse Cousin    Family Psychiatric  History: See Above Social History:  History  Alcohol Use No     History  Drug Use No    Social History   Social History  . Marital Status: Married    Spouse Name: N/A  . Number of Children: N/A  . Years of Education: N/A   Social History Main Topics  . Smoking status: Never Smoker   . Smokeless tobacco: Never Used  .  Alcohol Use: No  . Drug Use: No  . Sexual Activity: Yes    Birth Control/ Protection: Surgical   Other Topics Concern  . None   Social History Narrative   Additional Social History:    Allergies:  No Known Allergies  Labs:  Results for orders placed or performed during the hospital encounter of 09/06/15 (from the past 48 hour(s))  POC CBG, ED     Status: Abnormal   Collection Time: 09/06/15 11:33 AM  Result Value Ref Range   Glucose-Capillary 109 (H) 65 - 99 mg/dL  CBC with Differential     Status: Abnormal   Collection Time: 09/06/15 11:38 AM  Result Value Ref Range   WBC 13.0 (H) 4.0 - 10.5 K/uL   RBC 3.61 (L) 3.87 - 5.11 MIL/uL   Hemoglobin 10.3 (L) 12.0 - 15.0 g/dL   HCT 31.2 (L) 36.0 - 46.0 %   MCV 86.4 78.0 - 100.0 fL   MCH 28.5 26.0 - 34.0 pg   MCHC 33.0 30.0 - 36.0 g/dL   RDW 12.7 11.5 - 15.5 %   Platelets 342 150 - 400 K/uL   Neutrophils Relative % 81 %   Neutro Abs 10.5 (H) 1.7 - 7.7 K/uL   Lymphocytes Relative 9 %   Lymphs Abs 1.2 0.7 - 4.0 K/uL   Monocytes Relative 9 %   Monocytes Absolute 1.2 (H) 0.1 - 1.0 K/uL   Eosinophils Relative 1 %   Eosinophils Absolute 0.2 0.0 - 0.7 K/uL   Basophils Relative 0 %   Basophils Absolute 0.0 0.0 - 0.1 K/uL  Comprehensive metabolic panel     Status: Abnormal   Collection Time: 09/06/15 11:38 AM  Result Value Ref Range   Sodium 135 135 - 145 mmol/L   Potassium 4.0 3.5 - 5.1 mmol/L   Chloride 102 101 - 111 mmol/L   CO2 27 22 - 32 mmol/L   Glucose, Bld 168 (H) 65 - 99 mg/dL   BUN 29 (H) 6 - 20 mg/dL   Creatinine, Ser 1.53 (H) 0.44 - 1.00 mg/dL   Calcium 9.9 8.9 - 10.3 mg/dL   Total Protein 7.9 6.5 - 8.1 g/dL   Albumin 3.8 3.5 - 5.0 g/dL   AST 64 (H) 15 - 41 U/L   ALT 153 (H) 14 - 54 U/L   Alkaline Phosphatase 304 (H) 38 - 126 U/L   Total Bilirubin 2.3 (H) 0.3 - 1.2 mg/dL   GFR calc non Af Amer 40 (L) >60 mL/min   GFR calc Af Amer 47 (L) >60 mL/min    Comment: (NOTE) The eGFR has been calculated using the CKD  EPI equation. This calculation has not been validated in all clinical situations. eGFR's persistently <60 mL/min signify possible Chronic Kidney Disease.    Anion gap 6 5 - 15  Ethanol     Status: None   Collection Time: 09/06/15 11:38 AM  Result Value Ref Range  Alcohol, Ethyl (B) <5 <5 mg/dL    Comment:        LOWEST DETECTABLE LIMIT FOR SERUM ALCOHOL IS 5 mg/dL FOR MEDICAL PURPOSES ONLY   Urine culture     Status: None (Preliminary result)   Collection Time: 09/06/15  1:05 PM  Result Value Ref Range   Specimen Description URINE, CLEAN CATCH    Special Requests IMMUNE:COMPROMISED    Culture      NO GROWTH < 24 HOURS Performed at [facility]    Report Status PENDING   Urinalysis, Routine w reflex microscopic     Status: Abnormal   Collection Time: 09/06/15  1:15 PM  Result Value Ref Range   Color, Urine YELLOW YELLOW   APPearance CLEAR CLEAR   Specific Gravity, Urine <1.005 (L) 1.005 - 1.030   pH 6.5 5.0 - 8.0   Glucose, UA NEGATIVE NEGATIVE mg/dL   Hgb urine dipstick NEGATIVE NEGATIVE   Bilirubin Urine NEGATIVE NEGATIVE   Ketones, ur NEGATIVE NEGATIVE mg/dL   Protein, ur NEGATIVE NEGATIVE mg/dL   Nitrite NEGATIVE NEGATIVE   Leukocytes, UA SMALL (A) NEGATIVE  Urine rapid drug screen (hosp performed)     Status: None   Collection Time: 09/06/15  1:15 PM  Result Value Ref Range   Opiates NONE DETECTED NONE DETECTED   Cocaine NONE DETECTED NONE DETECTED   Benzodiazepines NONE DETECTED NONE DETECTED   Amphetamines NONE DETECTED NONE DETECTED   Tetrahydrocannabinol NONE DETECTED NONE DETECTED   Barbiturates NONE DETECTED NONE DETECTED    Comment:        DRUG SCREEN FOR MEDICAL PURPOSES ONLY.  IF CONFIRMATION IS NEEDED FOR ANY PURPOSE, NOTIFY LAB WITHIN 5 DAYS.        LOWEST DETECTABLE LIMITS FOR URINE DRUG SCREEN Drug Class       Cutoff (ng/mL) Amphetamine      1000 Barbiturate      200 Benzodiazepine   200 Tricyclics       300 Opiates           300 Cocaine          300 THC              50   Urine microscopic-add on     Status: Abnormal   Collection Time: 09/06/15  1:15 PM  Result Value Ref Range   Squamous Epithelial / LPF 6-30 (A) NONE SEEN   WBC, UA 6-30 0 - 5 WBC/hpf   RBC / HPF NONE SEEN 0 - 5 RBC/hpf   Bacteria, UA FEW (A) NONE SEEN  Ammonia     Status: None   Collection Time: 09/06/15  3:22 PM  Result Value Ref Range   Ammonia 17 9 - 35 umol/L  Lipase, blood     Status: None   Collection Time: 09/06/15  3:22 PM  Result Value Ref Range   Lipase 17 11 - 51 U/L  CBC     Status: Abnormal   Collection Time: 09/06/15  3:22 PM  Result Value Ref Range   WBC 12.8 (H) 4.0 - 10.5 K/uL   RBC 3.59 (L) 3.87 - 5.11 MIL/uL   Hemoglobin 10.2 (L) 12.0 - 15.0 g/dL   HCT 18.4 (L) 03.7 - 54.3 %   MCV 85.8 78.0 - 100.0 fL   MCH 28.4 26.0 - 34.0 pg   MCHC 33.1 30.0 - 36.0 g/dL   RDW 60.6 77.0 - 34.0 %   Platelets 353 150 - 400 K/uL  Creatinine, serum  Status: Abnormal   Collection Time: 09/06/15  3:33 PM  Result Value Ref Range   Creatinine, Ser 1.33 (H) 0.44 - 1.00 mg/dL   GFR calc non Af Amer 48 (L) >60 mL/min   GFR calc Af Amer 55 (L) >60 mL/min    Comment: (NOTE) The eGFR has been calculated using the CKD EPI equation. This calculation has not been validated in all clinical situations. eGFR's persistently <60 mL/min signify possible Chronic Kidney Disease.   CBG monitoring, ED     Status: Abnormal   Collection Time: 09/06/15  5:11 PM  Result Value Ref Range   Glucose-Capillary 244 (H) 65 - 99 mg/dL  Glucose, capillary     Status: Abnormal   Collection Time: 09/06/15 10:27 PM  Result Value Ref Range   Glucose-Capillary 215 (H) 65 - 99 mg/dL   Comment 1 Notify RN    Comment 2 Document in Chart   CBC     Status: Abnormal   Collection Time: 09/07/15  6:07 AM  Result Value Ref Range   WBC 12.2 (H) 4.0 - 10.5 K/uL   RBC 3.61 (L) 3.87 - 5.11 MIL/uL   Hemoglobin 10.3 (L) 12.0 - 15.0 g/dL   HCT 31.0 (L) 36.0 - 46.0 %    MCV 85.9 78.0 - 100.0 fL   MCH 28.5 26.0 - 34.0 pg   MCHC 33.2 30.0 - 36.0 g/dL   RDW 12.7 11.5 - 15.5 %   Platelets 353 150 - 400 K/uL  Comprehensive metabolic panel     Status: Abnormal   Collection Time: 09/07/15  6:07 AM  Result Value Ref Range   Sodium 137 135 - 145 mmol/L   Potassium 3.8 3.5 - 5.1 mmol/L   Chloride 105 101 - 111 mmol/L   CO2 24 22 - 32 mmol/L   Glucose, Bld 257 (H) 65 - 99 mg/dL   BUN 17 6 - 20 mg/dL   Creatinine, Ser 1.02 (H) 0.44 - 1.00 mg/dL   Calcium 9.4 8.9 - 10.3 mg/dL   Total Protein 7.4 6.5 - 8.1 g/dL   Albumin 3.3 (L) 3.5 - 5.0 g/dL   AST 48 (H) 15 - 41 U/L   ALT 128 (H) 14 - 54 U/L   Alkaline Phosphatase 317 (H) 38 - 126 U/L   Total Bilirubin 2.3 (H) 0.3 - 1.2 mg/dL   GFR calc non Af Amer >60 >60 mL/min   GFR calc Af Amer >60 >60 mL/min    Comment: (NOTE) The eGFR has been calculated using the CKD EPI equation. This calculation has not been validated in all clinical situations. eGFR's persistently <60 mL/min signify possible Chronic Kidney Disease.    Anion gap 8 5 - 15  Glucose, capillary     Status: Abnormal   Collection Time: 09/07/15  7:55 AM  Result Value Ref Range   Glucose-Capillary 244 (H) 65 - 99 mg/dL   Comment 1 Notify RN   Glucose, capillary     Status: Abnormal   Collection Time: 09/07/15 11:48 AM  Result Value Ref Range   Glucose-Capillary 316 (H) 65 - 99 mg/dL   Comment 1 Notify RN     Current Facility-Administered Medications  Medication Dose Route Frequency Provider Last Rate Last Dose  . 0.9 %  sodium chloride infusion   Intravenous Continuous Oswald Hillock, MD 75 mL/hr at 09/06/15 1730    . acetaminophen (TYLENOL) tablet 650 mg  650 mg Oral Q6H PRN Oswald Hillock, MD  Or  . acetaminophen (TYLENOL) suppository 650 mg  650 mg Rectal Q6H PRN Oswald Hillock, MD      . albuterol (PROVENTIL) (2.5 MG/3ML) 0.083% nebulizer solution 3 mL  3 mL Inhalation Q6H PRN Oswald Hillock, MD      . aspirin EC tablet 81 mg  81 mg Oral  Daily Oswald Hillock, MD   81 mg at 09/07/15 1038  . benztropine (COGENTIN) tablet 1 mg  1 mg Oral QHS Oswald Hillock, MD   1 mg at 09/06/15 2223  . cefTRIAXone (ROCEPHIN) 1 g in dextrose 5 % 50 mL IVPB  1 g Intravenous Q24H Oswald Hillock, MD      . DULoxetine (CYMBALTA) DR capsule 60 mg  60 mg Oral q1800 Oswald Hillock, MD   60 mg at 09/06/15 1800  . enoxaparin (LOVENOX) injection 40 mg  40 mg Subcutaneous Q24H Oswald Hillock, MD   40 mg at 09/06/15 2221  . gabapentin (NEURONTIN) capsule 300 mg  300 mg Oral BID Oswald Hillock, MD   300 mg at 09/07/15 1038  . insulin aspart (novoLOG) injection 0-9 Units  0-9 Units Subcutaneous TID WC Oswald Hillock, MD   7 Units at 09/07/15 1155  . levothyroxine (SYNTHROID, LEVOTHROID) tablet 25 mcg  25 mcg Oral QAC breakfast Oswald Hillock, MD   25 mcg at 09/07/15 [phone]  . lithium carbonate capsule 600 mg  600 mg Oral q morning - 10a Oswald Hillock, MD   600 mg at 09/07/15 1039  . lithium carbonate capsule 900 mg  900 mg Oral QPM Oswald Hillock, MD   900 mg at 09/06/15 2222  . LORazepam (ATIVAN) injection 1 mg  1 mg Intravenous Q6H PRN Oswald Hillock, MD      . LORazepam (ATIVAN) injection 2 mg  2 mg Intramuscular Q6H PRN Derrill Center, NP      . ondansetron (ZOFRAN) tablet 4 mg  4 mg Oral Q6H PRN Oswald Hillock, MD       Or  . ondansetron (ZOFRAN) injection 4 mg  4 mg Intravenous Q6H PRN Oswald Hillock, MD   4 mg at 09/07/15 1111  . pantoprazole (PROTONIX) EC tablet 40 mg  40 mg Oral Daily Oswald Hillock, MD   40 mg at 09/07/15 1038  . risperiDONE (RISPERDAL) tablet 3 mg  3 mg Oral QHS Oswald Hillock, MD   3 mg at 09/06/15 2222  . traZODone (DESYREL) tablet 200 mg  200 mg Oral QHS Oswald Hillock, MD   200 mg at 09/06/15 2223    Musculoskeletal: Strength & Muscle Tone: within normal limits Gait & Station: unable to asses patient in bed Patient leans: N/A  Psychiatric Specialty Exam: Review of Systems  Psychiatric/Behavioral: Negative for suicidal ideas and hallucinations. The patient  is nervous/anxious.   All other systems reviewed and are negative.   Blood pressure 137/70, pulse 103, temperature 100.1 F (37.8 C), temperature source Oral, resp. rate 18, height 5' (1.524 m), weight 70 kg (154 lb 5.2 oz), last menstrual period 01/23/2013, SpO2 100 %.Body mass index is 30.14 kg/(m^2).  General Appearance: Disheveled  Eye Contact::  Minimal  Speech:  Garbled  Volume:  Decreased  Mood:  Anxious and Irritable  Affect:  Constricted  Thought Process:  Intact  Orientation:  Other:  person, place, and family  Thought Content:  Hallucinations: None  Suicidal Thoughts:  No  Homicidal Thoughts:  No  Memory:  Immediate;   Fair  Judgement:  Fair  Insight:  Fair  Psychomotor Activity:  Restlessness  Concentration:  Poor  Recall:  [employer] of Knowledge:Poor  Language: Fair  Akathisia:  No  Handed:  Right  AIMS (if indicated):     Assets:  Desire for Improvement Physical Health Resilience  ADL's:  Intact  Cognition: Impaired,  Mild  Sleep:      Patient seen tele-assessment  for psychiatric evaluation follow-up, chart reviewed and case discussed with the MD Dwyane Dee Reviewed the information documented and agree with the treatment plan.  Treatment Plan Summary: Daily contact with patient to assess and evaluate symptoms and progress in treatment and Medication management    Placed on Ativan 2 mg PRNQ6 for agitation and elevated liver enzymes - Orders Placed for lithium level and CK -Psychiatry to Reassess on 09/08/2015 -Labs reviewed  Disposition: Supportive therapy provided about ongoing stressors. Reassess after medically cleared    Derrill Center, NP 09/07/2015 3:38 PM

## 2015-09-07 NOTE — Care Management Note (Signed)
Case Management Note  Patient Details  Name: SADEEN BEHRLE MRN: [id number] Date of Birth: Jul 04, 1970  Subjective/Objective:                    Action/Plan:  Patient is IVC at this time from home with husband .  More to follow.   Expected Discharge Date:  09/09/15               [facility]  [facility]  In-House Referral:  Clinical Social Work  Discharge planning Services  CM Consult  Post Acute Care Choice:    Choice offered to:     DME Arranged:    DME Agency:     HH Arranged:    [place] Agency:     Status of Service:  Completed, signed off  Medicare Important Message Given:    Date Medicare IM Given:    Medicare IM give by:    Date Additional Medicare IM Given:    Additional Medicare Important Message give by:     If discussed at [place] of Stay Meetings, dates discussed:    Additional Comments:  Alvie Heidelberg, RN 09/07/2015, 1:05 PM

## 2015-09-07 NOTE — Progress Notes (Signed)
[facility] PROGRESS NOTE  Kathryn [name] [id number] DOB: 1971-04-11    PCP:   [name], MD   HPI: Kathryn [name] is an 45 y.o. female with hx of bipolar disorder, schizoaffective disorder, HTN, depression, anxiety, admitted for agitation, abdominal pain of unclear etiology, AKI, and possible UTI.  She was given Geodon and given IVF, along with IV Rocephin after urine culture was obtained.  She is better, but is still intermittently agitated.  She was also given Ativan, and is slightly lethargic.  She has hx of DM, HTN, and anxiety as well.   Rewiew of Systems:  Constitutional: Negative for malaise, fever and chills. No significant weight loss or weight gain Eyes: Negative for eye pain, redness and discharge, diplopia, visual changes, or flashes of light. ENMT: Negative for ear pain, hoarseness, nasal congestion, sinus pressure and sore throat. No headaches; tinnitus, drooling, or problem swallowing. Cardiovascular: Negative for chest pain, palpitations, diaphoresis, dyspnea and peripheral edema. ; No orthopnea, PND Respiratory: Negative for cough, hemoptysis, wheezing and stridor. No pleuritic chestpain. Gastrointestinal: Negative for nausea, vomiting, diarrhea, constipation, abdominal pain, melena, blood in stool, hematemesis, jaundice and rectal bleeding.    Genitourinary: Negative for frequency, dysuria, incontinence,flank pain and hematuria; Musculoskeletal: Negative for back pain and neck pain. Negative for swelling and trauma.;  Skin: . Negative for pruritus, rash, abrasions, bruising and skin lesion.; ulcerations Neuro: Negative for headache, lightheadedness and neck stiffness. Negative for weakness, altered level of consciousness , altered mental status, extremity weakness, burning feet, involuntary movement, seizure and syncope.  Psych: negative for anxiety, depression, insomnia, tearfulness, panic attacks, hallucinations, paranoia, suicidal or homicidal ideation    Past  Medical History  Diagnosis Date  . Diabetes mellitus   . Bipolar disorder ([place])   . Depression   . Anxiety   . Hypertension   . Anemia   . Schizoaffective disorder [facility])     Past Surgical History  Procedure Laterality Date  . Cesarean section    . Tubal ligation      X3  . Cholecystectomy  06/02/2012    Procedure: LAPAROSCOPIC CHOLECYSTECTOMY;  Surgeon: Jamesetta So, MD;  Location: [facility] ORS;  Service: General;  Laterality: N/A;  Attempted laparoscopic cholecystectomy  . Cholecystectomy  06/02/2012    Procedure: CHOLECYSTECTOMY;  Surgeon: Jamesetta So, MD;  Location: [facility] ORS;  Service: General;  Laterality: N/A;  converted to open at  0905  . Breast surgery    . Esophagogastroduodenoscopy (egd) with propofol N/A 08/24/2015    Procedure: ESOPHAGOGASTRODUODENOSCOPY (EGD) WITH PROPOFOL;  Surgeon: Rogene Houston, MD;  Location: [facility] SUITE;  Service: Endoscopy;  Laterality: N/A;  1:10 - Ann to notify pt to arrive at 11:30    Medications:  HOME MEDS: Prior to Admission medications   Medication Sig Start Date End Date Taking? Authorizing Provider  acarbose (PRECOSE) 100 MG tablet Take 100 mg by mouth daily.   Yes Historical Provider, MD  albuterol (PROVENTIL HFA;VENTOLIN HFA) 108 (90 BASE) MCG/ACT inhaler Inhale 2 puffs into the lungs every 6 (six) hours as needed for wheezing.   Yes Historical Provider, MD  aspirin EC 81 MG tablet Take 81 mg by mouth daily.   Yes Historical Provider, MD  atorvastatin (LIPITOR) 10 MG tablet Take 1 tablet by mouth daily. 05/04/15  Yes Historical Provider, MD  benztropine (COGENTIN) 1 MG tablet Take 1 tablet (1 mg total) by mouth at bedtime. 07/06/15 07/05/16 Yes Cloria Spring, MD  DULoxetine (CYMBALTA) 60 MG capsule Take  1 capsule (60 mg total) by mouth daily at 6 PM. 07/06/15  Yes Cloria Spring, MD  gabapentin (NEURONTIN) 300 MG capsule Take 300 mg by mouth 2 (two) times daily. 03/28/15  Yes Historical Provider, MD  ibuprofen (ADVIL,MOTRIN) 800 MG tablet  Take 800 mg by mouth every 8 (eight) hours as needed.   Yes Historical Provider, MD  insulin lispro (HUMALOG) 100 UNIT/ML injection Inject into the skin 3 (three) times daily before meals.   Yes Historical Provider, MD  levothyroxine (SYNTHROID, LEVOTHROID) 25 MCG tablet Take 25 mcg by mouth daily before breakfast.  04/15/15  Yes Historical Provider, MD  Linaclotide (LINZESS) 145 MCG CAPS capsule Take 145 mcg by mouth daily. Reported on 08/22/2015   Yes Historical Provider, MD  lisinopril (PRINIVIL,ZESTRIL) 10 MG tablet Take 1 tablet by mouth daily. 05/04/15  Yes Historical Provider, MD  lithium carbonate 300 MG capsule Take 2 capsules in the am and 3 at bedtime 07/06/15  Yes Cloria Spring, MD  metFORMIN (GLUCOPHAGE) 1000 MG tablet Take 1,000 mg by mouth 2 (two) times daily. 04/20/13  Yes Historical Provider, MD  methocarbamol (ROBAXIN) 500 MG tablet Take 1 tablet by mouth as needed.  05/04/15  Yes Historical Provider, MD  omeprazole (PRILOSEC) 20 MG capsule Take 20 mg by mouth daily.   Yes Historical Provider, MD  ondansetron (ZOFRAN) 4 MG tablet Take 1 tablet (4 mg total) by mouth 2 (two) times daily as needed for nausea or vomiting. 08/24/15  Yes Rogene Houston, MD  pantoprazole (PROTONIX) 40 MG tablet Take 1 tablet (40 mg total) by mouth daily. 08/22/15  Yes Butch Penny, NP  pioglitazone (ACTOS) 15 MG tablet Take 15 mg by mouth daily.   Yes Historical Provider, MD  risperiDONE (RISPERDAL) 3 MG tablet Take 1 tablet (3 mg total) by mouth at bedtime. 07/06/15 07/05/16 Yes Cloria Spring, MD  TOUJEO SOLOSTAR 300 UNIT/ML SOPN Inject 70 Units into the skin at bedtime.  04/15/15  Yes Historical Provider, MD  traMADol (ULTRAM) 50 MG tablet Take 1 tablet by mouth daily as needed. 05/04/15  Yes Historical Provider, MD  traZODone (DESYREL) 100 MG tablet Take 2 tablets (200 mg total) by mouth at bedtime. 07/06/15  Yes Cloria Spring, MD  Vitamin D, Ergocalciferol, (DRISDOL) 50000 units CAPS capsule Take 50,000  Units by mouth every 7 (seven) days.   Yes Historical Provider, MD  sucralfate (CARAFATE) 1 GM/10ML suspension Take 10 mLs (1 g total) by mouth 4 (four) times daily. Patient not taking: Reported on 09/06/2015 08/30/15   Butch Penny, NP     Allergies:  No Known Allergies  Social History:   reports that she has never smoked. She has never used smokeless tobacco. She reports that she does not drink alcohol or use illicit drugs.  Family History: Family History  Problem Relation Age of Onset  . Stroke Other   . Diabetes Other   . Cancer Other   . Seizures Other   . Hypertension Mother   . Alcohol abuse Mother   . Hypertension Father   . Alcohol abuse Sister   . Alcohol abuse Maternal Aunt   . Alcohol abuse Paternal Aunt   . Alcohol abuse Maternal Grandfather   . Alcohol abuse Maternal Grandmother   . Alcohol abuse Cousin      Physical Exam: Filed Vitals:   09/06/15 1754 09/06/15 1805 09/06/15 1941 09/06/15 2029  BP: 126/62   154/81  Pulse: 105   110  Temp: 98.8 F (37.1 C)   98 F (36.7 C)  TempSrc: Oral   Oral  Resp: 20   18  Height:  5' (1.524 m)    Weight: 70.4 kg (155 lb 3.3 oz) 70 kg (154 lb 5.2 oz)    SpO2: 100%  93% 99%   Blood pressure 154/81, pulse 110, temperature 98 F (36.7 C), temperature source Oral, resp. rate 18, height 5' (1.524 m), weight 70 kg (154 lb 5.2 oz), last menstrual period 01/23/2013, SpO2 99 %.  GEN:  Pleasant  patient lying in the stretcher in no acute distress; cooperative with exam. PSYCH:  alert and oriented x4; does not appear anxious or depressed; affect is appropriate. HEENT: Mucous membranes pink and anicteric; PERRLA; EOM intact; no cervical lymphadenopathy nor thyromegaly or carotid bruit; no JVD; There were no stridor. Neck is very supple. Breasts:: Not examined CHEST WALL: No tenderness CHEST: Normal respiration, clear to auscultation bilaterally.  HEART: Regular rate and rhythm.  There are no murmur, rub, or gallops.   BACK:  No kyphosis or scoliosis; no CVA tenderness ABDOMEN: soft and non-tender; no masses, no organomegaly, normal abdominal bowel sounds; no pannus; no intertriginous candida. There is no rebound and no distention. Rectal Exam: Not done EXTREMITIES: No bone or joint deformity; age-appropriate arthropathy of the hands and knees; no edema; no ulcerations.  There is no calf tenderness. Genitalia: not examined PULSES: 2+ and symmetric SKIN: Normal hydration no rash or ulceration CNS: Cranial nerves 2-12 grossly intact no focal lateralizing neurologic deficit.  Speech is fluent; uvula elevated with phonation, facial symmetry and tongue midline. DTR are normal bilaterally, cerebella exam is intact, barbinski is negative and strengths are equaled bilaterally.  No sensory loss.   Labs on Admission:  Basic Metabolic Panel:  Recent Labs Lab 09/06/15 1138 09/06/15 1533 09/07/15 0607  NA 135  --  137  K 4.0  --  3.8  CL 102  --  105  CO2 27  --  24  GLUCOSE 168*  --  257*  BUN 29*  --  17  CREATININE 1.53* 1.33* 1.02*  CALCIUM 9.9  --  9.4   Liver Function Tests:  Recent Labs Lab 09/06/15 1138 09/07/15 0607  AST 64* 48*  ALT 153* 128*  ALKPHOS 304* 317*  BILITOT 2.3* 2.3*  PROT 7.9 7.4  ALBUMIN 3.8 3.3*    Recent Labs Lab 09/06/15 1522  LIPASE 17    Recent Labs Lab 09/06/15 1522  AMMONIA 17   CBC:  Recent Labs Lab 09/06/15 1138 09/06/15 1522 09/07/15 0607  WBC 13.0* 12.8* 12.2*  NEUTROABS 10.5*  --   --   HGB 10.3* 10.2* 10.3*  HCT 31.2* 30.8* 31.0*  MCV 86.4 85.8 85.9  PLT 342 353 353    CBG:  Recent Labs Lab 09/06/15 1133 09/06/15 1711 09/06/15 2227 09/07/15 0755  GLUCAP 109* 244* 215* 244*     Radiological Exams on Admission: Ct Abdomen Pelvis Wo Contrast  09/06/2015  CLINICAL DATA:  Elevated LFT. EXAM: CT ABDOMEN AND PELVIS WITHOUT CONTRAST TECHNIQUE: Multidetector CT imaging of the abdomen and pelvis was performed following the standard protocol  without IV contrast. COMPARISON:  CT abdomen pelvis 05/30/2015 FINDINGS: Lower chest: Lung bases clear. Mild pericardial thickening/ fluid slightly improved from the prior study. Hepatobiliary: Normal unenhanced liver. Cholecystectomy. Bile ducts nondilated. Pancreas: Negative Spleen: Negative Adrenals/Urinary Tract: Negative for renal mass or obstruction. No urinary tract calculi. Multiple calcified phleboliths in the retroperitoneum on the left. Mild  bladder wall thickening. Stomach/Bowel: Negative for bowel obstruction. No bowel edema. Negative for diverticulitis. Appendix not visualized. Vascular/Lymphatic: Normal aorta and IVC. Negative for lymphadenopathy. Reproductive: Normal uterus. Fallopian tube clip on the left. No clip on the right side. No pelvic mass Other: No free-fluid Musculoskeletal: No acute skeletal abnormality. IMPRESSION: Postop cholecystectomy.  No biliary duct dilatation. Mild bladder wall thickening Pericardial thickening/ effusion improved since the prior study. Electronically Signed   By: Franchot Gallo M.D.   On: 09/06/2015 16:13   Ct Head Wo Contrast  09/06/2015  CLINICAL DATA:  Decreased level of consciousness and slurred speech EXAM: CT HEAD WITHOUT CONTRAST TECHNIQUE: Contiguous axial images were obtained from the base of the skull through the vertex without intravenous contrast. COMPARISON:  08/02/2015 FINDINGS: The bony calvarium is intact. Air-fluid levels are again seen in the sphenoid sinus stable from the prior exam. No findings to suggest acute hemorrhage, acute infarction or space-occupying mass lesion are noted. IMPRESSION: Inflammatory changes in the sphenoid sinus stable from the prior exam. No acute intracranial abnormality noted. Electronically Signed   By: Inez Catalina M.D.   On: 09/06/2015 13:21   Dg Chest Port 1 View  09/06/2015  CLINICAL DATA:  Altered mental status. EXAM: PORTABLE CHEST 1 VIEW COMPARISON:  06/07/2015 FINDINGS: Hypoventilation. Decreased lung  volume with bibasilar atelectasis. Negative for heart failure or pneumonia. No effusion IMPRESSION: Hypoventilation with bibasilar atelectasis. Electronically Signed   By: Franchot Gallo M.D.   On: 09/06/2015 12:01    Assessment/Plan  . Elevated liver enzymes . AKI (acute kidney injury) ([place]) Schizoaffective disorder UTI Volume depletion. Patient has mild acute kidney injury, will start gentle IV hydration with normal saline Follow BMP in a.m.  Agitation:  Patient has history of schizoaffective disorder, she is on multiple psych medications. Continue all the medications. Once patient is medically stable she will need psychiatric consultation. Will start Ativan 1 mg every 6 hours when necessary for anxiety  Transaminitis Patient has elevated liver enzymes, she status post cholecystectomy Lipase was normal.  LFTs have improved.  ? UTI Patient has mildly abnormal UA, CT scan abdomen showed bladder wall thickening. Patient empirically started on ceftriaxone. Follow urine culture results.  DVT prophylaxis Lovenox   Code Status: FULL Haskel Khan, MD.  FACP [facility] Pager [phone] 7pm to 7am.  09/07/2015, 11:09 AM

## 2015-09-08 DIAGNOSIS — N179 Acute kidney failure, unspecified: Secondary | ICD-10-CM | POA: Diagnosis not present

## 2015-09-08 DIAGNOSIS — R748 Abnormal levels of other serum enzymes: Secondary | ICD-10-CM | POA: Diagnosis not present

## 2015-09-08 DIAGNOSIS — R451 Restlessness and agitation: Secondary | ICD-10-CM | POA: Diagnosis not present

## 2015-09-08 LAB — IGG, IGA, IGM
IgA: 228 mg/dL (ref 87–352)
IgG (Immunoglobin G), Serum: 1048 mg/dL (ref 700–1600)
IgM, Serum: 61 mg/dL (ref 26–217)

## 2015-09-08 LAB — URINE CULTURE: Culture: 50000 — AB

## 2015-09-08 LAB — HEPATITIS PANEL, ACUTE
HCV Ab: <0.1 s/co ratio (ref 0.0–0.9)
Hep A IgM: NEGATIVE
Hep B C IgM: NEGATIVE
Hepatitis B Surface Ag: NEGATIVE

## 2015-09-08 LAB — BASIC METABOLIC PANEL
Anion gap: 5 (ref 5–15)
BUN: 7 mg/dL (ref 6–20)
CO2: 23 mmol/L (ref 22–32)
Calcium: 9.1 mg/dL (ref 8.9–10.3)
Chloride: 108 mmol/L (ref 101–111)
Creatinine, Ser: 0.69 mg/dL (ref 0.44–1.00)
GFR calc Af Amer: >60 mL/min (ref >60)
GFR calc non Af Amer: >60 mL/min (ref >60)
Glucose, Bld: 287 mg/dL — ABNORMAL HIGH (ref 65–99)
Potassium: 4 mmol/L (ref 3.5–5.1)
Sodium: 136 mmol/L (ref 135–145)

## 2015-09-08 LAB — LITHIUM LEVEL: Lithium Lvl: 1.93 mmol/L (ref 0.60–1.20)

## 2015-09-08 NOTE — Progress Notes (Signed)
Patient ID: Kathryn [name], female   DOB: May 08, 1971, 45 y.o.   MRN: [id number]  Assessment/Plan: ADMITTED WITH MENTAL STATUS CHANGES AND ELEVATED LIVER ENZYMES. CLINICALLY STABLE.  PLAN: 1. CMP APR 16 2. AWAIT SEROLOGIES   Subjective: Since I last evaluated the patient [place]. No questions or concerns.   Objective: Vital signs in last 24 hours: Filed Vitals:   09/07/15 1440 09/07/15 2128  BP: 137/70 144/51  Pulse: 103 108  Temp: 100.1 F (37.8 C) 98.5 F (36.9 C)  Resp: 18 18     General appearance: alert, cooperative and no distress Resp: clear to auscultation bilaterally Cardio: regular rate and rhythm GI: soft, non-tender; bowel sounds normal; no masses,  no organomegaly  Lab Results:  NEGATIVE HEP ABC SEROLOGIES, Cr 0.69 NL IMMUNOGLOBULINS    Studies/Results: No results found.  Medications: I have reviewed the patient's current medications.   LOS: 5 days   Barney Drain 11/03/2013, 2:23 PM

## 2015-09-08 NOTE — Progress Notes (Signed)
Visitor upset with nursing staff. During shift change, visitor stated to oncoming nurse "I was about to kill those THOTs from last night".

## 2015-09-08 NOTE — Progress Notes (Signed)
Nursing staff performed shift change. At bedside, nurse was asking whether pt was suicide or safety. Previous nurse states she was unsure if pt was safety or suicide. Confirmed pt status with charge nurse. Pt is IVC due to suicidal ideations. Explained to pt's family that the room had to be searched and that the visitor was not allowed to have belongings at bedside. Explained to pt's mother that all visitors had to be wanded. Visitor upset that these things have not been done prior to our shift today. Archivist to explain to visitors the policy. Visitor states she had a fingernail clipper to cut patient's nails. Charge nurse told her the clipper could not be within reach of pt. Visitor very upset.

## 2015-09-08 NOTE — Progress Notes (Signed)
CRITICAL VALUE ALERT  Critical value received:  Lithium 1.93  Date of notification:  09/08/2015  Time of notification:  0716  Critical value read back:Yes.    Nurse who received alert:  S. Hamilton Therapist, sports. Reported to B. Arthur Holms RN  MD notified (1st page):  Dr. Marin Comment   Time of first page:  [phone]

## 2015-09-08 NOTE — Progress Notes (Signed)
[facility] PROGRESS NOTE  Kathryn [name] [id number] DOB: Jan 05, 1971    PCP:   [name], MD    [place] is an 45 y.o. female with hx of bipolar disorder, schizoaffective disorder, HTN, depression, anxiety, admitted for agitation, abdominal pain of unclear etiology, AKI, and possible UTI. She was given Geodon and given IVF, along with IV Rocephin after urine culture was obtained. She is better, but is still intermittently agitated. She was also given Ativan, and is slightly lethargic. She has hx of DM, HTN, and anxiety as well.  Her Lithium level came back in the toxic range, but she did not require dialysis.  She was given IVF and her level came down from 3.01 to 1.93 this morning. She is better.     Rewiew of Systems:  Constitutional: Negative for malaise, fever and chills. No significant weight loss or weight gain Eyes: Negative for eye pain, redness and discharge, diplopia, visual changes, or flashes of light. ENMT: Negative for ear pain, hoarseness, nasal congestion, sinus pressure and sore throat. No headaches; tinnitus, drooling, or problem swallowing. Cardiovascular: Negative for chest pain, palpitations, diaphoresis, dyspnea and peripheral edema. ; No orthopnea, PND Respiratory: Negative for cough, hemoptysis, wheezing and stridor. No pleuritic chestpain. Gastrointestinal: Negative for nausea, vomiting, diarrhea, constipation, abdominal pain, melena, blood in stool, hematemesis, jaundice and rectal bleeding.    Genitourinary: Negative for frequency, dysuria, incontinence,flank pain and hematuria; Musculoskeletal: Negative for back pain and neck pain. Negative for swelling and trauma.;  Skin: . Negative for pruritus, rash, abrasions, bruising and skin lesion.; ulcerations Neuro: Negative for headache, lightheadedness and neck stiffness. Negative for weakness, altered level of consciousness , altered mental status, extremity weakness, burning feet, involuntary  movement, seizure and syncope.  Psych: negative for anxiety, depression, insomnia, tearfulness, panic attacks, hallucinations, paranoia, suicidal or homicidal ideation   Past Medical History  Diagnosis Date  . Diabetes mellitus   . Bipolar disorder ([place])   . Depression   . Anxiety   . Hypertension   . Anemia   . Schizoaffective disorder [facility])     Past Surgical History  Procedure Laterality Date  . Cesarean section    . Tubal ligation      X3  . Cholecystectomy  06/02/2012    Procedure: LAPAROSCOPIC CHOLECYSTECTOMY;  Surgeon: Jamesetta So, MD;  Location: [facility] ORS;  Service: General;  Laterality: N/A;  Attempted laparoscopic cholecystectomy  . Cholecystectomy  06/02/2012    Procedure: CHOLECYSTECTOMY;  Surgeon: Jamesetta So, MD;  Location: [facility] ORS;  Service: General;  Laterality: N/A;  converted to open at  0905  . Breast surgery    . Esophagogastroduodenoscopy (egd) with propofol N/A 08/24/2015    Procedure: ESOPHAGOGASTRODUODENOSCOPY (EGD) WITH PROPOFOL;  Surgeon: Rogene Houston, MD;  Location: [facility] ENDO SUITE;  Service: Endoscopy;  Laterality: N/A;  1:10 - Ann to notify pt to arrive at 11:30    Medications:  HOME MEDS: Prior to Admission medications   Medication Sig Start Date End Date Taking? Authorizing Provider  acarbose (PRECOSE) 100 MG tablet Take 100 mg by mouth daily.   Yes Historical Provider, MD  albuterol (PROVENTIL HFA;VENTOLIN HFA) 108 (90 BASE) MCG/ACT inhaler Inhale 2 puffs into the lungs every 6 (six) hours as needed for wheezing.   Yes Historical Provider, MD  aspirin EC 81 MG tablet Take 81 mg by mouth daily.   Yes Historical Provider, MD  atorvastatin (LIPITOR) 10 MG tablet Take 1 tablet by mouth daily. 05/04/15  Yes Historical  Provider, MD  benztropine (COGENTIN) 1 MG tablet Take 1 tablet (1 mg total) by mouth at bedtime. 07/06/15 07/05/16 Yes Cloria Spring, MD  DULoxetine (CYMBALTA) 60 MG capsule Take 1 capsule (60 mg total) by mouth daily at 6 PM. 07/06/15  Yes  Cloria Spring, MD  gabapentin (NEURONTIN) 300 MG capsule Take 300 mg by mouth 2 (two) times daily. 03/28/15  Yes Historical Provider, MD  ibuprofen (ADVIL,MOTRIN) 800 MG tablet Take 800 mg by mouth every 8 (eight) hours as needed.   Yes Historical Provider, MD  insulin lispro (HUMALOG) 100 UNIT/ML injection Inject into the skin 3 (three) times daily before meals.   Yes Historical Provider, MD  levothyroxine (SYNTHROID, LEVOTHROID) 25 MCG tablet Take 25 mcg by mouth daily before breakfast.  04/15/15  Yes Historical Provider, MD  Linaclotide (LINZESS) 145 MCG CAPS capsule Take 145 mcg by mouth daily. Reported on 08/22/2015   Yes Historical Provider, MD  lisinopril (PRINIVIL,ZESTRIL) 10 MG tablet Take 1 tablet by mouth daily. 05/04/15  Yes Historical Provider, MD  lithium carbonate 300 MG capsule Take 2 capsules in the am and 3 at bedtime 07/06/15  Yes Cloria Spring, MD  metFORMIN (GLUCOPHAGE) 1000 MG tablet Take 1,000 mg by mouth 2 (two) times daily. 04/20/13  Yes Historical Provider, MD  methocarbamol (ROBAXIN) 500 MG tablet Take 1 tablet by mouth as needed.  05/04/15  Yes Historical Provider, MD  omeprazole (PRILOSEC) 20 MG capsule Take 20 mg by mouth daily.   Yes Historical Provider, MD  ondansetron (ZOFRAN) 4 MG tablet Take 1 tablet (4 mg total) by mouth 2 (two) times daily as needed for nausea or vomiting. 08/24/15  Yes Rogene Houston, MD  pantoprazole (PROTONIX) 40 MG tablet Take 1 tablet (40 mg total) by mouth daily. 08/22/15  Yes Butch Penny, NP  pioglitazone (ACTOS) 15 MG tablet Take 15 mg by mouth daily.   Yes Historical Provider, MD  risperiDONE (RISPERDAL) 3 MG tablet Take 1 tablet (3 mg total) by mouth at bedtime. 07/06/15 07/05/16 Yes Cloria Spring, MD  TOUJEO SOLOSTAR 300 UNIT/ML SOPN Inject 70 Units into the skin at bedtime.  04/15/15  Yes Historical Provider, MD  traMADol (ULTRAM) 50 MG tablet Take 1 tablet by mouth daily as needed. 05/04/15  Yes Historical Provider, MD  traZODone  (DESYREL) 100 MG tablet Take 2 tablets (200 mg total) by mouth at bedtime. 07/06/15  Yes Cloria Spring, MD  Vitamin D, Ergocalciferol, (DRISDOL) 50000 units CAPS capsule Take 50,000 Units by mouth every 7 (seven) days.   Yes Historical Provider, MD  sucralfate (CARAFATE) 1 GM/10ML suspension Take 10 mLs (1 g total) by mouth 4 (four) times daily. Patient not taking: Reported on 09/06/2015 08/30/15   Butch Penny, NP     Allergies:  No Known Allergies  Social History:   reports that she has never smoked. She has never used smokeless tobacco. She reports that she does not drink alcohol or use illicit drugs.  Family History: Family History  Problem Relation Age of Onset  . Stroke Other   . Diabetes Other   . Cancer Other   . Seizures Other   . Hypertension Mother   . Alcohol abuse Mother   . Hypertension Father   . Alcohol abuse Sister   . Alcohol abuse Maternal Aunt   . Alcohol abuse Paternal Aunt   . Alcohol abuse Maternal Grandfather   . Alcohol abuse Maternal Grandmother   . Alcohol abuse Cousin  Physical Exam: Filed Vitals:   09/06/15 1941 09/06/15 2029 09/07/15 1440 09/07/15 2128  BP:  154/81 137/70 144/51  Pulse:  110 103 108  Temp:  98 F (36.7 C) 100.1 F (37.8 C) 98.5 F (36.9 C)  TempSrc:  Oral Oral Oral  Resp:  18 18 18   Height:      Weight:      SpO2: 93% 99% 100% 100%   Blood pressure 144/51, pulse 108, temperature 98.5 F (36.9 C), temperature source Oral, resp. rate 18, height 5' (1.524 m), weight 70 kg (154 lb 5.2 oz), last menstrual period 01/23/2013, SpO2 100 %.  GEN:  Pleasant  patient lying in the stretcher in no acute distress; cooperative with exam. PSYCH:  alert and oriented x4; does not appear anxious or depressed; affect is appropriate. HEENT: Mucous membranes pink and anicteric; PERRLA; EOM intact; no cervical lymphadenopathy nor thyromegaly or carotid bruit; no JVD; There were no stridor. Neck is very supple. Breasts:: Not  examined CHEST WALL: No tenderness CHEST: Normal respiration, clear to auscultation bilaterally.  HEART: Regular rate and rhythm.  There are no murmur, rub, or gallops.   BACK: No kyphosis or scoliosis; no CVA tenderness ABDOMEN: soft and non-tender; no masses, no organomegaly, normal abdominal bowel sounds; no pannus; no intertriginous candida. There is no rebound and no distention. Rectal Exam: Not done EXTREMITIES: No bone or joint deformity; age-appropriate arthropathy of the hands and knees; no edema; no ulcerations.  There is no calf tenderness. Genitalia: not examined PULSES: 2+ and symmetric SKIN: Normal hydration no rash or ulceration CNS: Cranial nerves 2-12 grossly intact no focal lateralizing neurologic deficit.  Speech is fluent; uvula elevated with phonation, facial symmetry and tongue midline. DTR are normal bilaterally, cerebella exam is intact, barbinski is negative and strengths are equaled bilaterally.  No sensory loss.   Labs on Admission:  Basic Metabolic Panel:  Recent Labs Lab 09/06/15 1138 09/06/15 1533 09/07/15 0607 09/08/15 0633  NA 135  --  137 136  K 4.0  --  3.8 4.0  CL 102  --  105 108  CO2 27  --  24 23  GLUCOSE 168*  --  257* 287*  BUN 29*  --  17 7  CREATININE 1.53* 1.33* 1.02* 0.69  CALCIUM 9.9  --  9.4 9.1   Liver Function Tests:  Recent Labs Lab 09/06/15 1138 09/07/15 0607  AST 64* 48*  ALT 153* 128*  ALKPHOS 304* 317*  BILITOT 2.3* 2.3*  PROT 7.9 7.4  ALBUMIN 3.8 3.3*    Recent Labs Lab 09/06/15 1522  LIPASE 17    Recent Labs Lab 09/06/15 1522  AMMONIA 17   CBC:  Recent Labs Lab 09/06/15 1138 09/06/15 1522 09/07/15 0607  WBC 13.0* 12.8* 12.2*  NEUTROABS 10.5*  --   --   HGB 10.3* 10.2* 10.3*  HCT 31.2* 30.8* 31.0*  MCV 86.4 85.8 85.9  PLT 342 353 353   Cardiac Enzymes:  Recent Labs Lab 09/07/15 1522  CKTOTAL 18*    CBG:  Recent Labs Lab 09/06/15 1711 09/06/15 2227 09/07/15 0755 09/07/15 1148  09/07/15 1620  GLUCAP 244* 215* 244* 316* 289*     Radiological Exams on Admission: Ct Abdomen Pelvis Wo Contrast  09/06/2015  CLINICAL DATA:  Elevated LFT. EXAM: CT ABDOMEN AND PELVIS WITHOUT CONTRAST TECHNIQUE: Multidetector CT imaging of the abdomen and pelvis was performed following the standard protocol without IV contrast. COMPARISON:  CT abdomen pelvis 05/30/2015 FINDINGS: Lower chest: Lung bases clear.  Mild pericardial thickening/ fluid slightly improved from the prior study. Hepatobiliary: Normal unenhanced liver. Cholecystectomy. Bile ducts nondilated. Pancreas: Negative Spleen: Negative Adrenals/Urinary Tract: Negative for renal mass or obstruction. No urinary tract calculi. Multiple calcified phleboliths in the retroperitoneum on the left. Mild bladder wall thickening. Stomach/Bowel: Negative for bowel obstruction. No bowel edema. Negative for diverticulitis. Appendix not visualized. Vascular/Lymphatic: Normal aorta and IVC. Negative for lymphadenopathy. Reproductive: Normal uterus. Fallopian tube clip on the left. No clip on the right side. No pelvic mass Other: No free-fluid Musculoskeletal: No acute skeletal abnormality. IMPRESSION: Postop cholecystectomy.  No biliary duct dilatation. Mild bladder wall thickening Pericardial thickening/ effusion improved since the prior study. Electronically Signed   By: Franchot Gallo M.D.   On: 09/06/2015 16:13   Ct Head Wo Contrast  09/06/2015  CLINICAL DATA:  Decreased level of consciousness and slurred speech EXAM: CT HEAD WITHOUT CONTRAST TECHNIQUE: Contiguous axial images were obtained from the base of the skull through the vertex without intravenous contrast. COMPARISON:  08/02/2015 FINDINGS: The bony calvarium is intact. Air-fluid levels are again seen in the sphenoid sinus stable from the prior exam. No findings to suggest acute hemorrhage, acute infarction or space-occupying mass lesion are noted. IMPRESSION: Inflammatory changes in the sphenoid  sinus stable from the prior exam. No acute intracranial abnormality noted. Electronically Signed   By: Inez Catalina M.D.   On: 09/06/2015 13:21    EKG: Independently reviewed.   Assessment/Plan Present on Admission:  . Elevated liver enzymes . AKI (acute kidney injury) ([place]) . Altered mental status . Agitation . Lithium toxicity  PLAN:  Lithium toxicity:  This explained all her symptoms, and she is better with the level coming down.  Will cut back on IVF, and follow level.  She needs to be under telemetry.  I suspect she will be able to go home tomorrow pending results.  She probably should not be on Lithium.  There are other medications safer with wider therapeutic index such as Depakote or Valproic acid.  Will defer to outpatient psychiatry for recommendation.   AKI:  Resolved and fortunately, Cr remained normal.   Hepatitis:  Likely medication.  AIH and PBC work up in progress per GI.   Other plans as per orders. Code Status:FULL CODE.    Orvan Falconer, MD.  FACP [facility] Pager [phone] 7pm to 7am.  09/08/2015, 12:54 PM

## 2015-09-09 DIAGNOSIS — R451 Restlessness and agitation: Secondary | ICD-10-CM | POA: Diagnosis not present

## 2015-09-09 DIAGNOSIS — N179 Acute kidney failure, unspecified: Secondary | ICD-10-CM | POA: Diagnosis not present

## 2015-09-09 DIAGNOSIS — R748 Abnormal levels of other serum enzymes: Secondary | ICD-10-CM | POA: Diagnosis not present

## 2015-09-09 LAB — ANTI-SMOOTH MUSCLE ANTIBODY, IGG: F-Actin IgG: 10 Units (ref 0–19)

## 2015-09-09 LAB — COMPREHENSIVE METABOLIC PANEL
ALT: 113 U/L — ABNORMAL HIGH (ref 14–54)
AST: 45 U/L — ABNORMAL HIGH (ref 15–41)
Albumin: 2.7 g/dL — ABNORMAL LOW (ref 3.5–5.0)
Alkaline Phosphatase: 315 U/L — ABNORMAL HIGH (ref 38–126)
Anion gap: 5 (ref 5–15)
BUN: 8 mg/dL (ref 6–20)
CO2: 23 mmol/L (ref 22–32)
Calcium: 9 mg/dL (ref 8.9–10.3)
Chloride: 107 mmol/L (ref 101–111)
Creatinine, Ser: 0.82 mg/dL (ref 0.44–1.00)
GFR calc Af Amer: >60 mL/min (ref >60)
GFR calc non Af Amer: >60 mL/min (ref >60)
Glucose, Bld: 282 mg/dL — ABNORMAL HIGH (ref 65–99)
Potassium: 3.9 mmol/L (ref 3.5–5.1)
Sodium: 135 mmol/L (ref 135–145)
Total Bilirubin: 1.4 mg/dL — ABNORMAL HIGH (ref 0.3–1.2)
Total Protein: 6.1 g/dL — ABNORMAL LOW (ref 6.5–8.1)

## 2015-09-09 LAB — BASIC METABOLIC PANEL
Anion gap: 6 (ref 5–15)
BUN: 8 mg/dL (ref 6–20)
CO2: 23 mmol/L (ref 22–32)
Calcium: 9 mg/dL (ref 8.9–10.3)
Chloride: 105 mmol/L (ref 101–111)
Creatinine, Ser: 0.83 mg/dL (ref 0.44–1.00)
GFR calc Af Amer: >60 mL/min (ref >60)
GFR calc non Af Amer: >60 mL/min (ref >60)
Glucose, Bld: 336 mg/dL — ABNORMAL HIGH (ref 65–99)
Potassium: 4.1 mmol/L (ref 3.5–5.1)
Sodium: 134 mmol/L — ABNORMAL LOW (ref 135–145)

## 2015-09-09 LAB — LITHIUM LEVEL: Lithium Lvl: 1.17 mmol/L (ref 0.60–1.20)

## 2015-09-09 LAB — MITOCHONDRIAL ANTIBODIES: Mitochondrial M2 Ab, IgG: 6 Units (ref 0.0–20.0)

## 2015-09-09 NOTE — Discharge Summary (Signed)
Physician Discharge Summary  Kathryn [name] [id number] DOB: [birth year] DOA: 09/06/2015  PCP: [name], MD  Admit date: 09/06/2015 Discharge date: 09/09/2015  Time spent: 55 minutes  Recommendations for Outpatient Follow-up:  1. Follow up with PCP in 1-2 weeks 2. She will need to follow up with Psychiatry as an outpatient for recommendations on medication.    Discharge Diagnoses:  Principal Problem:   Lithium toxicity Active Problems:   Diabetes ([facility])   Elevated liver enzymes   AKI (acute kidney injury) ([place])   Altered mental status   Agitation   Discharge Condition: improved  Diet recommendation: carb modified  Filed Weights   09/06/15 1132 09/06/15 1754 09/06/15 1805  Weight: 68.04 kg (150 lb) 70.4 kg (155 lb 3.3 oz) 70 kg (154 lb 5.2 oz)    History of present illness:  58 yof with a hx of DM type 2, bipolar disorder, Depression, Anxiety, HTN, and Schizoaffective disorder presented to the hospital with complaints of agitation for 3 days prior to admission. She also complained of abd pain and arm pains. Per family, she had not been eating or drinking for the past 3 days, and lab work revealed that she was dehydrated. Her LFTs and alkaline phosphatase were also abnormal. A CT of the abdomen was performed but did not show any significant abnormalities, however, she is s/p cholecystectomy. While in the ED, she had been agitated, was given Geodon, and was admitted for further evaluation.    Hospital Course:  Pt was found to have abnormal LFT's and dehydration, however, a CT of her abd did not show any acute abnormalities. GI was consulted who determined that her elevated LFTs was likely due to her home medications. AIH and PBC serologies were obtained and are pending.  Psychiatry evaluated her and orders were placed for her Lithium levels to be measured. She was found to have lithium toxicity, which explained all her symptoms. Her lithium was subsequently discontinued. Her lithium  levels were monitored and had returned to therapeutic levels by 4/16. Her kidney Fx tests remained normal.  She was given ample of IVF.  She also appeared improved symptomatically and was ready for discharge.  She will be discharge today, with plan to follow up with her psychiatrist.  Safer medication for her bipolar will be determined by her psychiatrist.  She no longer needs IVC, and it was discontinued.   Thank you and Good Day.   Procedures:  none  Consultations:  Psychiatry  Gastro  Discharge Exam: Filed Vitals:   09/08/15 2103 09/09/15 0626  BP: 130/84 118/65  Pulse: 92 99  Temp: 98.6 F (37 C) 98.5 F (36.9 C)  Resp: 20 20     General: NAD, looks comfortable  Cardiovascular: RRR, S1, S2   Respiratory: clear bilaterally, No wheezing, rales or rhonchi  Abdomen: soft, non tender, no distention , bowel sounds normal  Musculoskeletal: No edema b/l   Discharge Instructions   Discharge Instructions    Diet - low sodium heart healthy    Complete by:  As directed      Discharge instructions    Complete by:  As directed   DO NOT Take Lithium.  Follow up with your psychiatrist as soon as possible.     Increase activity slowly    Complete by:  As directed           Current Discharge Medication List    CONTINUE these medications which have NOT CHANGED   Details  acarbose (PRECOSE) 100  MG tablet Take 100 mg by mouth daily.    albuterol (PROVENTIL HFA;VENTOLIN HFA) 108 (90 BASE) MCG/ACT inhaler Inhale 2 puffs into the lungs every 6 (six) hours as needed for wheezing.    aspirin EC 81 MG tablet Take 81 mg by mouth daily.    atorvastatin (LIPITOR) 10 MG tablet Take 1 tablet by mouth daily.    benztropine (COGENTIN) 1 MG tablet Take 1 tablet (1 mg total) by mouth at bedtime. Qty: 30 tablet, Refills: 2    DULoxetine (CYMBALTA) 60 MG capsule Take 1 capsule (60 mg total) by mouth daily at 6 PM. Qty: 30 capsule, Refills: 2    gabapentin (NEURONTIN) 300 MG capsule  Take 300 mg by mouth 2 (two) times daily.    insulin lispro (HUMALOG) 100 UNIT/ML injection Inject into the skin 3 (three) times daily before meals.    levothyroxine (SYNTHROID, LEVOTHROID) 25 MCG tablet Take 25 mcg by mouth daily before breakfast.     Linaclotide (LINZESS) 145 MCG CAPS capsule Take 145 mcg by mouth daily. Reported on 08/22/2015    lisinopril (PRINIVIL,ZESTRIL) 10 MG tablet Take 1 tablet by mouth daily.    metFORMIN (GLUCOPHAGE) 1000 MG tablet Take 1,000 mg by mouth 2 (two) times daily.    omeprazole (PRILOSEC) 20 MG capsule Take 20 mg by mouth daily.    pantoprazole (PROTONIX) 40 MG tablet Take 1 tablet (40 mg total) by mouth daily. Qty: 30 tablet, Refills: 5    pioglitazone (ACTOS) 15 MG tablet Take 15 mg by mouth daily.    risperiDONE (RISPERDAL) 3 MG tablet Take 1 tablet (3 mg total) by mouth at bedtime. Qty: 30 tablet, Refills: 2    TOUJEO SOLOSTAR 300 UNIT/ML SOPN Inject 70 Units into the skin at bedtime.     Vitamin D, Ergocalciferol, (DRISDOL) 50000 units CAPS capsule Take 50,000 Units by mouth every 7 (seven) days.    sucralfate (CARAFATE) 1 GM/10ML suspension Take 10 mLs (1 g total) by mouth 4 (four) times daily. Qty: 420 mL, Refills: 1   Associated Diagnoses: Gastroesophageal reflux disease without esophagitis      STOP taking these medications     ibuprofen (ADVIL,MOTRIN) 800 MG tablet      lithium carbonate 300 MG capsule      methocarbamol (ROBAXIN) 500 MG tablet      ondansetron (ZOFRAN) 4 MG tablet      traMADol (ULTRAM) 50 MG tablet      traZODone (DESYREL) 100 MG tablet        No Known Allergies    The results of significant diagnostics from this hospitalization (including imaging, microbiology, ancillary and laboratory) are listed below for reference.    Significant Diagnostic Studies: Ct Abdomen Pelvis Wo Contrast  09/06/2015  CLINICAL DATA:  Elevated LFT. EXAM: CT ABDOMEN AND PELVIS WITHOUT CONTRAST TECHNIQUE: Multidetector  CT imaging of the abdomen and pelvis was performed following the standard protocol without IV contrast. COMPARISON:  CT abdomen pelvis 05/30/2015 FINDINGS: Lower chest: Lung bases clear. Mild pericardial thickening/ fluid slightly improved from the prior study. Hepatobiliary: Normal unenhanced liver. Cholecystectomy. Bile ducts nondilated. Pancreas: Negative Spleen: Negative Adrenals/Urinary Tract: Negative for renal mass or obstruction. No urinary tract calculi. Multiple calcified phleboliths in the retroperitoneum on the left. Mild bladder wall thickening. Stomach/Bowel: Negative for bowel obstruction. No bowel edema. Negative for diverticulitis. Appendix not visualized. Vascular/Lymphatic: Normal aorta and IVC. Negative for lymphadenopathy. Reproductive: Normal uterus. Fallopian tube clip on the left. No clip on the right side. No  pelvic mass Other: No free-fluid Musculoskeletal: No acute skeletal abnormality. IMPRESSION: Postop cholecystectomy.  No biliary duct dilatation. Mild bladder wall thickening Pericardial thickening/ effusion improved since the prior study. Electronically Signed   By: Franchot Gallo M.D.   On: 09/06/2015 16:13   Ct Head Wo Contrast  09/06/2015  CLINICAL DATA:  Decreased level of consciousness and slurred speech EXAM: CT HEAD WITHOUT CONTRAST TECHNIQUE: Contiguous axial images were obtained from the base of the skull through the vertex without intravenous contrast. COMPARISON:  08/02/2015 FINDINGS: The bony calvarium is intact. Air-fluid levels are again seen in the sphenoid sinus stable from the prior exam. No findings to suggest acute hemorrhage, acute infarction or space-occupying mass lesion are noted. IMPRESSION: Inflammatory changes in the sphenoid sinus stable from the prior exam. No acute intracranial abnormality noted. Electronically Signed   By: Inez Catalina M.D.   On: 09/06/2015 13:21   Nm Gastric Emptying  08/29/2015  CLINICAL DATA:  Epigastric pain with nausea and early  satiety. Diabetes mellitus. EXAM: NUCLEAR MEDICINE GASTRIC EMPTYING SCAN TECHNIQUE: After oral ingestion of radiolabeled meal, sequential abdominal images were obtained for 4 hours. Percentage of activity emptying the stomach was calculated at 1 hour, 2 hour, 3 hour, and 4 hours. RADIOPHARMACEUTICALS:  2.0 mCi Tc-63m MDP labeled sulfur colloid in egg orally COMPARISON:  None. FINDINGS: Expected location of the stomach in the left upper quadrant. Ingested meal empties the stomach gradually over the course of the study. 36.1% emptied at 1 hr ( normal >= 10%) 62.0% emptied at 2 hr ( normal >= 40%) 86.0% emptied at 3 hr ( normal >= 70%) 93.5% emptied at 4 hr ( normal >= 90%) IMPRESSION: Normal gastric emptying study. Electronically Signed   By: Lowella Grip III M.D.   On: 08/29/2015 12:31   Dg Chest Port 1 View  09/06/2015  CLINICAL DATA:  Altered mental status. EXAM: PORTABLE CHEST 1 VIEW COMPARISON:  06/07/2015 FINDINGS: Hypoventilation. Decreased lung volume with bibasilar atelectasis. Negative for heart failure or pneumonia. No effusion IMPRESSION: Hypoventilation with bibasilar atelectasis. Electronically Signed   By: Franchot Gallo M.D.   On: 09/06/2015 12:01    Microbiology: Recent Results (from the past 240 hour(s))  Urine culture     Status: Abnormal   Collection Time: 09/06/15  1:05 PM  Result Value Ref Range Status   Specimen Description URINE, CLEAN CATCH  Final   Special Requests IMMUNE:COMPROMISED  Final   Culture (A)  Final    50,000 COLONIES/mL DIPHTHEROIDS(CORYNEBACTERIUM SPECIES) Standardized susceptibility testing for this organism is not available. Performed at [facility]    Report Status 09/08/2015 FINAL  Final     Labs: Basic Metabolic Panel:  Recent Labs Lab 09/06/15 1138 09/06/15 1533 09/07/15 0607 09/08/15 0633 09/09/15 0622 09/09/15 0830  NA 135  --  137 136 135 134*  K 4.0  --  3.8 4.0 3.9 4.1  CL 102  --  105 108 107 105  CO2 27  --  24 23 23  23   GLUCOSE 168*  --  257* 287* 282* 336*  BUN 29*  --  17 7 8 8   CREATININE 1.53* 1.33* 1.02* 0.69 0.82 0.83  CALCIUM 9.9  --  9.4 9.1 9.0 9.0   Liver Function Tests:  Recent Labs Lab 09/06/15 1138 09/07/15 0607 09/09/15 0622  AST 64* 48* 45*  ALT 153* 128* 113*  ALKPHOS 304* 317* 315*  BILITOT 2.3* 2.3* 1.4*  PROT 7.9 7.4 6.1*  ALBUMIN 3.8 3.3*  2.7*    Recent Labs Lab 09/06/15 1522  LIPASE 17    Recent Labs Lab 09/06/15 1522  AMMONIA 17   CBC:  Recent Labs Lab 09/06/15 1138 09/06/15 1522 09/07/15 0607  WBC 13.0* 12.8* 12.2*  NEUTROABS 10.5*  --   --   HGB 10.3* 10.2* 10.3*  HCT 31.2* 30.8* 31.0*  MCV 86.4 85.8 85.9  PLT 342 353 353   Cardiac Enzymes:  Recent Labs Lab 09/07/15 1522  CKTOTAL 18*   BNP: BNP (last 3 results) No results for input(s): BNP in the last 8760 hours.  ProBNP (last 3 results) No results for input(s): PROBNP in the last 8760 hours.  CBG:  Recent Labs Lab 09/06/15 1711 09/06/15 2227 09/07/15 0755 09/07/15 1148 09/07/15 1620  GLUCAP 244* 215* 244* 316* 289*   Signed: Orvan Falconer, MD. Rosalita Chessman. [facility] 09/09/2015, 10:07 AM  By signing my name below, I, Delene Ruffini, attest that this documentation has been prepared under the direction and in the presence of Orvan Falconer, MD. Electronically Signed: Delene Ruffini, Scribe 09/09/2015 9:15am

## 2015-09-09 NOTE — Progress Notes (Signed)
This nurse was in the pt's room when her husband was on his cell phone telling someone "these  @[id number] in this hospital gave my wife psychotic medicine and I didn't know anything about it". Thats when this nurse left the room and informed the charge nurse of the situation that was brewing.

## 2015-09-09 NOTE — Progress Notes (Addendum)
Patient ID: Kathryn [name], female   DOB: 1971/01/18, 45 y.o.   MRN: [id number]  Assessment/Plan: ADMITTED WITH MENTAL STATUS CHANGES AND ELEVATED LIVER ENZYMES LIKELY DUE TO LIPITOR. CLINICALLY STABLE. LIVER ENZYMES STABLE.  PLAN: 1. AWAIT SEROLOGIES 2. OPV IN 4-6 WEEKS WITH DR. Laural Golden 3. HOLD LIPITOR OM DISCHARGE   Subjective: Since I last evaluated the patient HER MS HAS RETURNED TO BASELINE. No questions or concerns.   Objective: Vital signs in last 24 hours: Filed Vitals:   09/08/15 2103 09/09/15 0626  BP: 130/84 118/65  Pulse: 92 99  Temp: 98.6 F (37 C) 98.5 F (36.9 C)  Resp: 20 20     General appearance: alert, cooperative and no distress GI: soft, non-tender; bowel sounds normal; no masses,  no organomegaly  Lab Results:  Results for orders placed or performed during the hospital encounter of 09/06/15 (from the past 24 hour(s))  Comprehensive metabolic panel     Status: Abnormal   Collection Time: 09/09/15  6:22 AM  Result Value Ref Range   Sodium 135 135 - 145 mmol/L   Potassium 3.9 3.5 - 5.1 mmol/L   Chloride 107 101 - 111 mmol/L   CO2 23 22 - 32 mmol/L   Glucose, Bld 282 (H) 65 - 99 mg/dL   BUN 8 6 - 20 mg/dL   Creatinine, Ser 0.82 0.44 - 1.00 mg/dL   Calcium 9.0 8.9 - 10.3 mg/dL   Total Protein 6.1 (L) 6.5 - 8.1 g/dL   Albumin 2.7 (L) 3.5 - 5.0 g/dL   AST 45 (H) 15 - 41 U/L   ALT 113 (H) 14 - 54 U/L   Alkaline Phosphatase 315 (H) 38 - 126 U/L   Total Bilirubin 1.4 (H) 0.3 - 1.2 mg/dL   GFR calc non Af Amer >60 >60 mL/min   GFR calc Af Amer >60 >60 mL/min   Anion gap 5 5 - 15  Lithium level     Status: None   Collection Time: 09/09/15  8:30 AM  Result Value Ref Range   Lithium Lvl 1.17 0.60 - 1.20 mmol/L    Studies/Results: No results found.  Medications: I have reviewed the patient's current medications.   LOS: 5 days   Barney Drain 11/03/2013, 2:23 PM

## 2015-09-09 NOTE — Telephone Encounter (Addendum)
Assessment/Plan: ADMITTED WITH MENTAL STATUS CHANGES DUE TO LITHIUM TOXICITY AND ELEVATED LIVER ENZYMES LIKELY DUE TO LIPITOR.  1. SEROLOGIES NEGATIVE FOR PBC/AIH 2. OPV IN 4-6 WEEKS WITH DR. Laural Golden E30 ELEVATED LIVER ENZYMES

## 2015-09-09 NOTE — Progress Notes (Signed)
Nurses outside pt's room overheard pt's husband cussing at the sitter because pt was given certain meds  that he did not approve of.  He calmed down when security was summoned and he understood that he would need to talk to the doctor about his concerns pertaining to his wife's meds.

## 2015-09-10 LAB — GLUCOSE, CAPILLARY
Glucose-Capillary: 213 mg/dL — ABNORMAL HIGH (ref 65–99)
Glucose-Capillary: 260 mg/dL — ABNORMAL HIGH (ref 65–99)
Glucose-Capillary: 247 mg/dL — ABNORMAL HIGH (ref 65–99)
Glucose-Capillary: 240 mg/dL — ABNORMAL HIGH (ref 65–99)
Glucose-Capillary: 270 mg/dL — ABNORMAL HIGH (ref 65–99)
Glucose-Capillary: 237 mg/dL — ABNORMAL HIGH (ref 65–99)
Glucose-Capillary: 25 mg/dL — CL (ref 65–99)
Glucose-Capillary: 304 mg/dL — ABNORMAL HIGH (ref 65–99)

## 2015-09-10 LAB — ANTINUCLEAR ANTIBODIES, IFA: ANA Ab, IFA: NEGATIVE

## 2015-09-10 NOTE — Telephone Encounter (Signed)
Hope - Please make the patient a 4 week appointment per Dr.[name], with him. Make me aware when it is,so that I may do lab order that she will need to have drawn prior to the [place].  Thank you

## 2015-09-10 NOTE — Telephone Encounter (Signed)
Talked with patient yesterday. She is to return for office visit in 4 weeks. She will have LFTs prior to office visit.

## 2015-09-11 ENCOUNTER — Telehealth (INDEPENDENT_AMBULATORY_CARE_PROVIDER_SITE_OTHER): Payer: Self-pay | Admitting: *Deleted

## 2015-09-11 NOTE — Telephone Encounter (Signed)
Due to cost we have changed the Carafate to the following: Generic Carafate 1 gram tablets - patient is to take 1 gram QID. #120 2 refills. This was confirmed with Robin at [facility]. Pharmacy is going to call the patient and make her aware.

## 2015-09-12 ENCOUNTER — Telehealth (INDEPENDENT_AMBULATORY_CARE_PROVIDER_SITE_OTHER): Payer: Self-pay | Admitting: *Deleted

## 2015-09-12 DIAGNOSIS — R7989 Other specified abnormal findings of blood chemistry: Secondary | ICD-10-CM

## 2015-09-12 DIAGNOSIS — R945 Abnormal results of liver function studies: Principal | ICD-10-CM

## 2015-09-12 NOTE — Telephone Encounter (Signed)
Dr.[name] ask that the patient have lab work prior to [place] 10/04/2015.

## 2015-09-18 ENCOUNTER — Encounter (HOSPITAL_COMMUNITY): Payer: Self-pay | Admitting: Psychiatry

## 2015-09-18 ENCOUNTER — Ambulatory Visit (INDEPENDENT_AMBULATORY_CARE_PROVIDER_SITE_OTHER): Payer: Medicare Other | Admitting: Psychiatry

## 2015-09-18 ENCOUNTER — Other Ambulatory Visit (HOSPITAL_COMMUNITY): Payer: Self-pay | Admitting: *Deleted

## 2015-09-18 VITALS — BP 149/92 | HR 71 | Ht 60.0 in | Wt 155.8 lb

## 2015-09-18 DIAGNOSIS — F251 Schizoaffective disorder, depressive type: Secondary | ICD-10-CM

## 2015-09-18 MED ORDER — RISPERIDONE 3 MG PO TABS: 3.0000 mg | ORAL_TABLET | Freq: Every day | ORAL | Status: DC

## 2015-09-18 MED ORDER — BENZTROPINE MESYLATE 1 MG PO TABS: 1.0000 mg | ORAL_TABLET | Freq: Every day | ORAL | Status: DC

## 2015-09-18 MED ORDER — RISPERIDONE 1 MG PO TABS: 1.0000 mg | ORAL_TABLET | ORAL | Status: DC

## 2015-09-18 MED ORDER — DULOXETINE HCL 60 MG PO CPEP: 60.0000 mg | ORAL_CAPSULE | Freq: Every day | ORAL | Status: DC
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
## 2015-09-19 ENCOUNTER — Encounter (INDEPENDENT_AMBULATORY_CARE_PROVIDER_SITE_OTHER): Payer: Self-pay | Admitting: *Deleted

## 2015-09-19 ENCOUNTER — Other Ambulatory Visit (INDEPENDENT_AMBULATORY_CARE_PROVIDER_SITE_OTHER): Payer: Self-pay | Admitting: *Deleted

## 2015-09-19 DIAGNOSIS — R7989 Other specified abnormal findings of blood chemistry: Secondary | ICD-10-CM

## 2015-09-19 DIAGNOSIS — R945 Abnormal results of liver function studies: Principal | ICD-10-CM

## 2015-09-20 ENCOUNTER — Other Ambulatory Visit: Payer: Self-pay | Admitting: *Deleted

## 2015-09-20 DIAGNOSIS — F209 Schizophrenia, unspecified: Secondary | ICD-10-CM

## 2015-09-20 DIAGNOSIS — E119 Type 2 diabetes mellitus without complications: Secondary | ICD-10-CM

## 2015-09-20 DIAGNOSIS — Z794 Long term (current) use of insulin: Secondary | ICD-10-CM

## 2015-09-20 NOTE — Patient Outreach (Signed)
[facility]) Care Management  09/20/2015  [name] A Ruder [birth year] [id number]  Referral from MD office: request to check if patient is taking medications properly because changes were made. Recent hospital stay from 04/13-04/16/2017.  Subjective/Objective: Telephone call to patient who was advised of reason for call & [facility] care management services. Patient voices that she was admitted to [facility] with numbness, hallucinations, trouble breathing and swelling in hands recently. States she was diagnosed with lithium toxicity. States she was on lithium for schizophrenia. Voices that she was taken off of lithium and trazodone. States she has had trouble sleeping and other medication has been increased to help with problem of sleeping.   Voices that she has seen primary care provider 09/20/15 for hospital follow up and has seen psychiatrist 09/18/15 for hospital follow up. No problems getting to appointments. States spouse takes her to appointments. Gets all prescriptions filled at [facility]. Has lots of family support. States she can call psychiatry office and they call fit her in if there is a need during regular hours. Has 800 crises line if needed during after hours.   Noted in medical records that patient also has diabetes and last recorded "Hgb A1c" was 11.0 on 07/20/2015. Blood  glucose levels while in hospital ranged from 168-287.    Recommendations for [facility] services. Patient agrees to [facility] services of community care coordinator.  Assessment: --Recent hospital stay from 04/13-04/16/2017 with lithium toxicity. _Changes in medication regime made upon discharge.  -MD wants to make sure patient is taking medications as prescribed.  -Patient managing own medications.  _Complex case management .  Plan: refer to care management assistant to assign to community care coordinator for complex case management. Recently discharge from inpatient stay 09/09/15 with lithium  toxicity.  Sherrin Daisy, RN BSN [place] Management Coordinator [facility] Care Management  [phone]

## 2015-09-27 ENCOUNTER — Other Ambulatory Visit: Payer: Self-pay | Admitting: *Deleted

## 2015-09-27 NOTE — Patient Outreach (Signed)
Referral received. Call to patient regarding referral to [facility] community case management. Patient agrees to home visit. Reviewed schedules and set initial home visit and assessment for next week. Patient given RNCM contact information. Plan to visit next week. Kathryn [name]. [name], RN, BSN, [place] [phone]

## 2015-10-02 ENCOUNTER — Ambulatory Visit (HOSPITAL_COMMUNITY): Payer: Self-pay | Admitting: Psychiatry

## 2015-10-03 ENCOUNTER — Encounter: Payer: Self-pay | Admitting: *Deleted

## 2015-10-03 ENCOUNTER — Other Ambulatory Visit: Payer: Self-pay | Admitting: *Deleted

## 2015-10-03 VITALS — BP 136/88 | HR 90 | Resp 20 | Wt 158.0 lb

## 2015-10-03 DIAGNOSIS — Z598 Other problems related to housing and economic circumstances: Secondary | ICD-10-CM

## 2015-10-03 DIAGNOSIS — Z5989 Other problems related to housing and economic circumstances: Secondary | ICD-10-CM

## 2015-10-03 LAB — HEPATIC FUNCTION PANEL
ALT: 11 U/L (ref 6–29)
AST: 11 U/L (ref 10–30)
Albumin: 3.7 g/dL (ref 3.6–5.1)
Alkaline Phosphatase: 72 U/L (ref 33–115)
Bilirubin, Direct: 0.1 mg/dL (ref <=0.2)
Indirect Bilirubin: 0.4 mg/dL (ref 0.2–1.2)
Total Bilirubin: 0.5 mg/dL (ref 0.2–1.2)
Total Protein: 6.1 g/dL (ref 6.1–8.1)

## 2015-10-03 NOTE — Patient Outreach (Signed)
[facility] [facility]) Care Management   10/03/2015  Kathryn [name] 1971/02/18 [id number]  Kathryn [name] is an 45 y.o. female  Subjective:  Patient states she would like to work on getting diabetes under better control. Patient has appointment with Dr.[name] tomorrow-assisted patient with writing questions to MD  Patient lives in rental home, but would like to move to an apartment, she is married and there is an adult daughter living in the home, she agrees to [facility] SW referral.  Patient was recently inpatient due to lithium toxicity, she is off lithium. She does however state that she has some medications that she does not take due to cost, she states that linzess is $45 co-pay. She also states that she sometimes forgets to take her medications. Patient agreeable to [place] referral to look at medication management and affordability.   Patient is seeing her doctor regularly and also sees her counselor regularly.   Patient reports having issues with sleep even with medications.   Objective:   BP 136/88 mmHg  Pulse 90  Resp 20  Wt 158 lb (71.668 kg)  SpO2 98%  LMP 01/23/2013 Review of Systems  Constitutional: Negative.   HENT: Negative.   Eyes: Negative.   Respiratory: Negative.   Cardiovascular: Negative.   Gastrointestinal: Positive for heartburn and abdominal pain.  Genitourinary: Negative.   Musculoskeletal: Negative.   Psychiatric/Behavioral: The patient has insomnia.     Physical Exam  Constitutional: She is oriented to person, place, and time. She appears well-developed and well-nourished.  Cardiovascular: Normal rate.   GI: Soft. Bowel sounds are normal.  Musculoskeletal: Normal range of motion.  Neurological: She is alert and oriented to person, place, and time.  Skin: Skin is warm and dry.    Encounter Medications:   Outpatient Encounter Prescriptions as of 10/03/2015  Medication Sig Note  . acarbose (PRECOSE) 100 MG tablet Take 100 mg by mouth  daily. 10/03/2015: 3x day per bottle and patient  . aspirin EC 81 MG tablet Take 81 mg by mouth daily.   Marland Kitchen atorvastatin (LIPITOR) 10 MG tablet Take 1 tablet by mouth daily.   . benztropine (COGENTIN) 1 MG tablet Take 1 tablet (1 mg total) by mouth at bedtime.   . DULoxetine (CYMBALTA) 60 MG capsule Take 1 capsule (60 mg total) by mouth daily at 6 PM.   . gabapentin (NEURONTIN) 300 MG capsule Take 300 mg by mouth 2 (two) times daily.   . insulin lispro (HUMALOG) 100 UNIT/ML injection Inject into the skin 3 (three) times daily before meals. 10/03/2015: 15u 3x day  . levothyroxine (SYNTHROID, LEVOTHROID) 25 MCG tablet Take 25 mcg by mouth daily before breakfast.    . lisinopril (PRINIVIL,ZESTRIL) 10 MG tablet Take 1 tablet by mouth daily.   . metFORMIN (GLUCOPHAGE) 1000 MG tablet Take 1,000 mg by mouth 2 (two) times daily. 10/03/2015: Needs to pick up from pharmacy  . omeprazole (PRILOSEC) 20 MG capsule Take 20 mg by mouth daily.   . pioglitazone (ACTOS) 15 MG tablet Take 15 mg by mouth daily.   . risperiDONE (RISPERDAL) 1 MG tablet Take 1 tablet (1 mg total) by mouth every morning.   . risperiDONE (RISPERDAL) 3 MG tablet Take 1 tablet (3 mg total) by mouth at bedtime.   [name] SOLOSTAR 300 UNIT/ML SOPN Inject 70 Units into the skin at bedtime.  10/03/2015: Increased to 76u qhs  . traZODone (DESYREL) 100 MG tablet Take 100 mg by mouth at bedtime.   Marland Kitchen  Vitamin D, Ergocalciferol, (DRISDOL) 50000 units CAPS capsule Take 50,000 Units by mouth every 7 (seven) days.   Marland Kitchen albuterol (PROVENTIL HFA;VENTOLIN HFA) 108 (90 BASE) MCG/ACT inhaler Inhale 2 puffs into the lungs every 6 (six) hours as needed for wheezing. Reported on 10/03/2015 10/03/2015: Needs prescription   . Linaclotide (LINZESS) 145 MCG CAPS capsule Take 145 mcg by mouth daily. Reported on 10/03/2015 10/03/2015: Not taking due to cost  . pantoprazole (PROTONIX) 40 MG tablet Take 1 tablet (40 mg total) by mouth daily. (Patient not taking: Reported on  10/03/2015)   . sucralfate (CARAFATE) 1 GM/10ML suspension Take 10 mLs (1 g total) by mouth 4 (four) times daily. (Patient not taking: Reported on 10/03/2015) 10/03/2015: Patient not familiar with this prescription and does not have the bottle   No facility-administered encounter medications on file as of 10/03/2015.    Functional Status:   In your present state of health, do you have any difficulty performing the following activities: 10/03/2015 09/06/2015  Hearing? N N  Vision? N N  Difficulty concentrating or making decisions? N N  Walking or climbing stairs? N N  Dressing or bathing? N N  Doing errands, shopping? N N  Preparing Food and eating ? N -  Using the Toilet? N -  In the past six months, have you accidently leaked urine? N -  Do you have problems with loss of bowel control? N -  Managing your Medications? N -  Managing your Finances? Y -  Housekeeping or managing your Housekeeping? N -    Fall/Depression Screening:    PHQ 2/9 Scores 09/20/2015  PHQ - 2 Score 2  PHQ- 9 Score 10    Assessment:   Depression: Patient needs to take medications regularly. Will refer to [facility] pharmacy Diabetes: Uncontrolled-gave and reviewed diabetes educational packet and the carbohydrate counting tear off educational sheet. Housing: patient lives in rental home, needs better housing, will refer to [place]:   [place] Problem One        Most Recent Value   Care Plan Problem One  Medication management   Role Documenting the Problem One  Care Management [place] for Problem One  Active   THN Long Term Goal (31-90 days)  Patient will have all needed medications over the next 60 days   THN Long Term Goal Start Date  10/03/15   Interventions for Problem One Long Term Goal  Refer to [place] regarding medication assistance programs and low income subsidy application   THN CM Short Term Goal #1 (0-30 days)  Patient will be adherent to medications   THN CM Short Term  Goal #1 Start Date  10/03/15   Interventions for Short Term Goal #1  Reviewed medication, discussed importance of medication management, refer to [place] CM Care Plan Problem Two        Most Recent Value   Care Plan Problem Two  knowledge deficit related to diabetes as evidenced by questions around diabetes from patient   Role Documenting the Problem Two  Care Management [place] for Problem Two  Active   Interventions for Problem Two Long Term Goal   Gave and reviewed diabetes packet and CHO counting tear off page   [facility] Long Term Goal (31-90) days  Patient will have knowledge of diabetic diet over the next 90 days   THN Long Term Goal Start Date  10/03/15   [facility]  CM Short Term Goal #1 (0-30 days)  Patient will report CBG readings below 250 over the next 21 days   THN CM Short Term Goal #1 Start Date  10/03/15   Interventions for Short Term Goal #2   Gave and reviewed diabetic diet information     Send initial assessment to Primary care MD and also to referring MD.  Will refer to [place] [facility] pharmacist Plan to visit patient next month. Royetta Crochet. [name], RN, BSN, [place] [phone]

## 2015-10-04 ENCOUNTER — Encounter: Payer: Self-pay | Admitting: *Deleted

## 2015-10-04 ENCOUNTER — Ambulatory Visit (INDEPENDENT_AMBULATORY_CARE_PROVIDER_SITE_OTHER): Payer: Self-pay | Admitting: Internal Medicine

## 2015-10-05 ENCOUNTER — Other Ambulatory Visit: Payer: Self-pay | Admitting: Licensed Clinical Social Worker

## 2015-10-05 ENCOUNTER — Encounter: Payer: Self-pay | Admitting: Licensed Clinical Social Worker

## 2015-10-05 NOTE — Patient Outreach (Signed)
Assessment:  CSW received referral on Kathryn [name]. CSW completed chart review on client. Client is receiving [facility] nursing support with RN Kathryn [name].  Client sees Dr. Jani [name] as primary care doctor.  Client has some family support. Client has mental health needs. She has a diagnosis of schizoaffective disorder.  She has a diagnosis of Post TraumaticStress Disorder.  Kathryn Amor RN communicated recently with CSW to inform CSW that client was having stress related to current housing issues and that client needed information about local housing resources. CSW called client home phone number on 10/05/15. CSW spoke via phone with client on 10/05/15. CSW verified identity of client. CSW received verbal permission from client on 10/05/15 for CSW to talk with client about current needs of client. Client said she has some of her prescribed medications. She is having difficulty paying for some of her prescribed medications. CSW informed client that Kathryn Amor RN had made a referral for client to [place] program for support. Kathryn said that she would like to speak with a [facility] pharmacist about her current medication costs. CSW and client spoke of a care plan goal for client.  Client agreed that a care plan goal for her at present would be for her to communicate as needed in next 30 days with CSW regarding affordable housing options for client.  CSW also gave client the names and phone numbers of 3 different apartment complexes in [place] ,[place]. She said she would call these apartment complexes to inquire about housing options at those complexes.  Client said she sees Dr. Levonne [name], psychiatrist, for mental health care. Client said she has appointment again with Dr. Harrington [name] on October 26, 2015.  Client and CSW completed needed [facility] assessments for client. CSW again informed client of [facility] program support services. Client said she resides at her home with her spouse and her adult daughter. Client said she sees Dr.  Jani [name] as her primary care doctor. CSW thanked Kathryn for phone conversation with CSW on 10/10/14. CSW encouraged Kathryn to call CSW at [phone] as needed to discuss social work needs of client.  Plan:  Client to communicate as needed, with CSW in next 30 days to discuss affordable housing options for client. CSW to collaborate with RN Kathryn [name] in monitoring needs of client. CSW to call client in two weeks to assess needs of client.   Kathryn [name].Kathryn [name] MSW, LCSW Licensed Clinical Social Worker [facility] Care Management [phone]

## 2015-10-08 ENCOUNTER — Encounter (INDEPENDENT_AMBULATORY_CARE_PROVIDER_SITE_OTHER): Payer: Self-pay | Admitting: Internal Medicine

## 2015-10-08 ENCOUNTER — Ambulatory Visit (INDEPENDENT_AMBULATORY_CARE_PROVIDER_SITE_OTHER): Payer: Medicare Other | Admitting: Internal Medicine

## 2015-10-08 VITALS — BP 130/92 | HR 82 | Temp 98.6°F | Resp 18 | Ht 60.0 in | Wt 168.7 lb

## 2015-10-08 DIAGNOSIS — K219 Gastro-esophageal reflux disease without esophagitis: Secondary | ICD-10-CM | POA: Diagnosis not present

## 2015-10-08 DIAGNOSIS — R143 Flatulence: Secondary | ICD-10-CM

## 2015-10-08 DIAGNOSIS — R7989 Other specified abnormal findings of blood chemistry: Secondary | ICD-10-CM

## 2015-10-08 DIAGNOSIS — K589 Irritable bowel syndrome without diarrhea: Secondary | ICD-10-CM | POA: Diagnosis not present

## 2015-10-08 DIAGNOSIS — R945 Abnormal results of liver function studies: Secondary | ICD-10-CM

## 2015-10-08 NOTE — Progress Notes (Signed)
Presenting complaint;  Follow-up for nausea vomiting GERD and abnormal LFTs. Patient complains of excessive flatulence.  Database:  Patient is 45 year old African female was initially seen in the office on 08/22/2015 for abdominal pain nausea and vomiting. Asian has multiple medical problems including diabetes of 6 years duration. Patient underwent EGD on 08/24/2015 revealing nonerosive antral gastritis with H. pylori stains were negative. She returned for solid-phase gastric emptying study on 08/29/2015 and it was within normal limits. She had 38% of the activity in stomach at 2 hours and asked than 7% of activity in the stomach and 4 hours. Patient was admitted to [facility] on 09/06/2015 with agitation abdominal pain and poor intake over the last 3 days. Patient was noted to have mildly elevated serum creatinine of 1.53. LFTs were also abnormal. AP was 3 or 4 AST 64 and ALT 153. She was noted to have toxic lithium level. He was seen in consultation by Dr. Oneida Alar who was covering for me. Abdominopelvic CT revealed evidence of prior cholecystectomy but no evidence of liver or bile duct abnormality. Markers for hepatitis A B and C were negative. Similarly autoimmune markers were negative. AMA was also negative. As lithium level came down her acute symptoms resolved. She was advised to follow-up with me.   Subjective:   Patient feels much better. She has not had any episode of nausea vomiting heartburn or abdominal pain since she was discharged from the hospital 4 weeks ago. She gives history of IBS-C. she says Linzess has been working and she is trying to get this prescription refilled. Today she is complaining of excessive flatulence. She states she has no control. She denies melena or rectal bleeding or diarrhea. She has gained 11 pounds since her last office visit of 08/22/2015. She has dysphagia when she eats fried food. She feels omeprazole is working well. She is not taking sucralfate. She did not  fill the prescription because her co-pay was $90. Patient states she walks 1 mile every day. She has no difficulty walking even though she has numbness in toes of left foot. She has peripheral neuropathy for which she is on gabapentin.   Current Medications: Outpatient Encounter Prescriptions as of 10/08/2015  Medication Sig  . acarbose (PRECOSE) 100 MG tablet Take 100 mg by mouth 3 (three) times daily with meals.   Marland Kitchen albuterol (PROVENTIL HFA;VENTOLIN HFA) 108 (90 BASE) MCG/ACT inhaler Inhale 2 puffs into the lungs every 6 (six) hours as needed for wheezing. Reported on 10/03/2015  . aspirin EC 81 MG tablet Take 81 mg by mouth daily.  Marland Kitchen atorvastatin (LIPITOR) 10 MG tablet Take 1 tablet by mouth daily.  . benztropine (COGENTIN) 1 MG tablet Take 1 tablet (1 mg total) by mouth at bedtime.  . DULoxetine (CYMBALTA) 60 MG capsule Take 1 capsule (60 mg total) by mouth daily at 6 PM.  . gabapentin (NEURONTIN) 300 MG capsule Take 300 mg by mouth 2 (two) times daily.  . insulin lispro (HUMALOG) 100 UNIT/ML injection Inject into the skin 3 (three) times daily before meals.  Marland Kitchen levothyroxine (SYNTHROID, LEVOTHROID) 25 MCG tablet Take 25 mcg by mouth daily before breakfast.   . lisinopril (PRINIVIL,ZESTRIL) 10 MG tablet Take 1 tablet by mouth daily. Reported on 10/08/2015  . metFORMIN (GLUCOPHAGE) 1000 MG tablet Take 1,000 mg by mouth 2 (two) times daily.  Marland Kitchen omeprazole (PRILOSEC) 20 MG capsule Take 20 mg by mouth daily.  . pioglitazone (ACTOS) 15 MG tablet Take 15 mg by mouth daily.  . risperiDONE (  RISPERDAL) 1 MG tablet Take 1 tablet (1 mg total) by mouth every morning.  . risperiDONE (RISPERDAL) 3 MG tablet Take 1 tablet (3 mg total) by mouth at bedtime.  [name] SOLOSTAR 300 UNIT/ML SOPN Inject 76 Units into the skin at bedtime.   . traZODone (DESYREL) 100 MG tablet Take 200 mg by mouth at bedtime.   . Vitamin D, Ergocalciferol, (DRISDOL) 50000 units CAPS capsule Take 50,000 Units by mouth every 7 (seven)  days.  . Linaclotide (LINZESS) 145 MCG CAPS capsule Take 145 mcg by mouth daily. Reported on 10/08/2015  . sucralfate (CARAFATE) 1 GM/10ML suspension Take 10 mLs (1 g total) by mouth 4 (four) times daily. (Patient not taking: Reported on 10/03/2015)  . [DISCONTINUED] pantoprazole (PROTONIX) 40 MG tablet Take 1 tablet (40 mg total) by mouth daily. (Patient not taking: Reported on 10/08/2015)   No facility-administered encounter medications on file as of 10/08/2015.     Objective: Blood pressure 130/92, pulse 82, temperature 98.6 F (37 C), temperature source Oral, resp. rate 18, height 5' (1.524 m), weight 168 lb 11.2 oz (76.522 kg), last menstrual period 01/23/2013. Patient is alert and in no acute distress. Conjunctiva is pink. Sclera is nonicteric Oropharyngeal mucosa is normal. No neck masses or thyromegaly noted. Cardiac exam with regular rhythm normal S1 and S2. No murmur or gallop noted. Lungs are clear to auscultation. Abdomen is full. Bowel sounds are normal. On palpation is soft and nontender without organomegaly or masses. Percussion noticed tympanitic across upper abdomen in in right low quadrant. No LE edema or clubbing noted.  Labs/studies Results: LFTs from 10/02/2015 Bilirubin 0.5, AP 72, AST 11, ALT 11, total protein 6.1 and albumin 3.7.    Assessment:  #1. Abnormal LFTs noted on recent hospitalization most likely secondary to lithium hepatotoxicity(she had toxic levels on admission). Imaging was negative for stigmata of chronic liver disease and markers for viral hepatitis as well as autoimmune and cholestatic liver disease were negative. She is on atorvastatin obviously has nothing to do with recent hepatic injury. LFTs are normal and will be repeated in 6 months. #2. Intractable flatulence secondary to acarbose. #3. Gastroesophageal reflux disease. Symptoms well controlled with dietary measures and PPI. Recent EGD was negative for erosive reflux esophagitis or Barrett's.  Gastric emptying study was also normal. #4. IBS-C. Linzess is working.   Plan:  Sucralfate removed from patient's list of medications. Patient advised to check with Dr. Maudie Mercury if she could come off acarbose. Patient encouraged to walk daily as she is doing.  Office visit in 6 months.

## 2015-10-08 NOTE — Patient Instructions (Addendum)
Please check with Dr. Maudie Mercury if you can come off acarbose(cause of excessive gas).

## 2015-10-18 ENCOUNTER — Other Ambulatory Visit: Payer: Self-pay | Admitting: Licensed Clinical Social Worker

## 2015-10-18 NOTE — Patient Outreach (Signed)
Assessment:  CSW spoke via phone with client on 10/18/15. CSW verified client identity. CSW and client spoke of client needs. Client said she is eating well and sleeping well. She said she saw her primary doctor, Dr. Maudie Mercury, for an appointment this week. She said she sees Dr. Harrington Challenger, psychiatrist, for mental health needs. She said she has an appointment scheduled with Dr. Harrington Challenger for October 26, 2015.  RN Burgess Amor is providing [facility] nursing support for client. Stanton Kidney has made a referral for client for [facility] pharmacy support.  Client said she has issues with irritable bowel syndrome. She spoke of difficulty paying for prescribed medication, Linzess.  CSW and client spoke of client care plan. CSW encouraged client to continue communicating with CSW in next 30 days to discuss affordable housing options for client in the area. CSW had previously given client information on affordable housing locations in [place], [place]. Client said she had communicated with several of these housing locations. CSW encouraged client to communicate as needed with RN Burgess Amor regarding nursing needs of client. CSW encouraged Kathryn [name] to call CSW at [phone] as needed to discuss social work needs of client. Kathryn [name] was appreciative of phone call from [name] on 10/18/15.  Plan:  Client to communicate with CSW in next 30 days as needed to discuss affordable housing options for client in the area. CSW to call client in 3 weeks to assess needs of client at that time.  Norva Riffle.Hettie Roselli MSW, LCSW Licensed Clinical Social Worker [facility] Care Management [phone]

## 2015-10-24 ENCOUNTER — Telehealth (HOSPITAL_COMMUNITY): Payer: Self-pay | Admitting: *Deleted

## 2015-10-24 NOTE — Telephone Encounter (Signed)
noted 

## 2015-10-24 NOTE — Telephone Encounter (Signed)
Pt called stating she would like to cancel her appt for 10-26-2015 due to going out of town tonight. Per pt, her aunt is in the hospital in [place] and is in Critical Condition and don't really know as to when she will be returning yet. Pt did make a f/u appt for July 19th.

## 2015-10-26 ENCOUNTER — Ambulatory Visit (HOSPITAL_COMMUNITY): Payer: Self-pay | Admitting: Psychiatry

## 2015-10-29 ENCOUNTER — Encounter (HOSPITAL_COMMUNITY): Payer: Self-pay

## 2015-10-29 ENCOUNTER — Emergency Department (HOSPITAL_COMMUNITY)
Admission: EM | Admit: 2015-10-29 | Discharge: 2015-10-29 | Disposition: A | Discharge status: Home / Self Care | Payer: Medicare Other | Attending: Emergency Medicine | Admitting: Emergency Medicine

## 2015-10-29 DIAGNOSIS — I10 Essential (primary) hypertension: Secondary | ICD-10-CM | POA: Insufficient documentation

## 2015-10-29 DIAGNOSIS — Z7982 Long term (current) use of aspirin: Secondary | ICD-10-CM | POA: Insufficient documentation

## 2015-10-29 DIAGNOSIS — F329 Major depressive disorder, single episode, unspecified: Secondary | ICD-10-CM | POA: Insufficient documentation

## 2015-10-29 DIAGNOSIS — Z794 Long term (current) use of insulin: Secondary | ICD-10-CM | POA: Diagnosis not present

## 2015-10-29 DIAGNOSIS — Z79899 Other long term (current) drug therapy: Secondary | ICD-10-CM | POA: Diagnosis not present

## 2015-10-29 DIAGNOSIS — R1012 Left upper quadrant pain: Secondary | ICD-10-CM | POA: Diagnosis present

## 2015-10-29 DIAGNOSIS — E119 Type 2 diabetes mellitus without complications: Secondary | ICD-10-CM | POA: Insufficient documentation

## 2015-10-29 LAB — CBC WITH DIFFERENTIAL/PLATELET
Basophils Absolute: 0 10*3/uL (ref 0.0–0.1)
Basophils Relative: 1 %
Eosinophils Absolute: 0.3 10*3/uL (ref 0.0–0.7)
Eosinophils Relative: 5 %
HCT: 34.7 % — ABNORMAL LOW (ref 36.0–46.0)
Hemoglobin: 11.7 g/dL — ABNORMAL LOW (ref 12.0–15.0)
Lymphocytes Relative: 38 %
Lymphs Abs: 2.2 10*3/uL (ref 0.7–4.0)
MCH: 28.1 pg (ref 26.0–34.0)
MCHC: 33.7 g/dL (ref 30.0–36.0)
MCV: 83.4 fL (ref 78.0–100.0)
Monocytes Absolute: 0.4 10*3/uL (ref 0.1–1.0)
Monocytes Relative: 6 %
Neutro Abs: 2.9 10*3/uL (ref 1.7–7.7)
Neutrophils Relative %: 50 %
Platelets: 268 10*3/uL (ref 150–400)
RBC: 4.16 MIL/uL (ref 3.87–5.11)
RDW: 12.7 % (ref 11.5–15.5)
WBC: 5.9 10*3/uL (ref 4.0–10.5)

## 2015-10-29 LAB — COMPREHENSIVE METABOLIC PANEL
ALT: 14 U/L (ref 14–54)
AST: 14 U/L — ABNORMAL LOW (ref 15–41)
Albumin: 3.7 g/dL (ref 3.5–5.0)
Alkaline Phosphatase: 55 U/L (ref 38–126)
Anion gap: 6 (ref 5–15)
BUN: 21 mg/dL — ABNORMAL HIGH (ref 6–20)
CO2: 28 mmol/L (ref 22–32)
Calcium: 8.9 mg/dL (ref 8.9–10.3)
Chloride: 104 mmol/L (ref 101–111)
Creatinine, Ser: 0.99 mg/dL (ref 0.44–1.00)
GFR calc Af Amer: >60 mL/min (ref >60)
GFR calc non Af Amer: >60 mL/min (ref >60)
Glucose, Bld: 217 mg/dL — ABNORMAL HIGH (ref 65–99)
Potassium: 3.4 mmol/L — ABNORMAL LOW (ref 3.5–5.1)
Sodium: 138 mmol/L (ref 135–145)
Total Bilirubin: 0.7 mg/dL (ref 0.3–1.2)
Total Protein: 6.8 g/dL (ref 6.5–8.1)

## 2015-10-29 LAB — URINALYSIS, ROUTINE W REFLEX MICROSCOPIC
Bilirubin Urine: NEGATIVE
Glucose, UA: NEGATIVE mg/dL
Hgb urine dipstick: NEGATIVE
Ketones, ur: NEGATIVE mg/dL
Leukocytes, UA: NEGATIVE
Nitrite: NEGATIVE
Protein, ur: NEGATIVE mg/dL
Specific Gravity, Urine: 1.01 (ref 1.005–1.030)
pH: 6 (ref 5.0–8.0)

## 2015-10-29 LAB — LIPASE, BLOOD: Lipase: 18 U/L (ref 11–51)

## 2015-10-29 MED ORDER — ONDANSETRON HCL 4 MG PO TABS: 4.0000 mg | ORAL_TABLET | Freq: Three times a day (TID) | ORAL | Status: DC | PRN

## 2015-10-29 MED ORDER — HYDROCODONE-ACETAMINOPHEN 5-325 MG PO TABS
1.0000 | ORAL_TABLET | Freq: Once | ORAL | Status: AC
  Administered 2015-10-29: 1 via ORAL
  Filled 2015-10-29: qty 1

## 2015-10-29 MED ORDER — HYDROCODONE-ACETAMINOPHEN 5-325 MG PO TABS: 1.0000 | ORAL_TABLET | Freq: Four times a day (QID) | ORAL | Status: DC | PRN

## 2015-10-29 MED ORDER — ONDANSETRON 4 MG PO TBDP
4.0000 mg | ORAL_TABLET | Freq: Once | ORAL | Status: AC
  Administered 2015-10-29: 4 mg via ORAL
  Filled 2015-10-29: qty 1

## 2015-10-29 MED ORDER — SUCRALFATE 1 G PO TABS: 1.0000 g | ORAL_TABLET | Freq: Three times a day (TID) | ORAL | Status: DC

## 2015-10-29 MED ORDER — RANITIDINE HCL 150 MG PO TABS: 150.0000 mg | ORAL_TABLET | Freq: Two times a day (BID) | ORAL | Status: DC

## 2015-10-29 NOTE — ED Notes (Signed)
Pt reports left side and back pain x 2 weeks.  Reports nausea, no vomiting.  Pt says has IBS and lbm was yesterday and was diarrhea.  Denies any vaginal discharge or urinary symptoms.

## 2015-10-29 NOTE — ED Notes (Signed)
Dr. Maryan Rued okay with current bp, pt told to follow up with doctor and take medication.

## 2015-10-29 NOTE — ED Provider Notes (Signed)
CSN: [id number]     Arrival date & time 10/29/15  0810 History  By signing my name below, I, Nicole Kindred, attest that this documentation has been prepared under the direction and in the presence of Blanchie Dessert, MD.   Electronically Signed: Nicole Kindred, ED Scribe. 10/29/2015. 9:25 AM   Chief Complaint  Patient presents with  . Abdominal Pain   The history is provided by the patient. No language interpreter was used.   HPI Comments: Kathryn [name] is a 45 y.o. female with PMHx of DM, anemia, bipolar disorder, and schizoaffective disorder and PSHx of cholecystectomy who presents to the Emergency Department complaining of gradual onset, worsening, stabbing, left-sided abdominal pain and left-sided back pain, ongoing for two weeks. Pt reports associated nausea and diarrhea. She states her back pain feels like "spasms". Her pain presents similarly to her previous tubal pregnancies. Pt's LNMP was three years ago. No other associated symptoms noted. Her pain is worse with certain positions while laying down. Her pain is also worse with eating. Pt has tried ibuprofen and aleve with no relief to symptoms. No other worsening or alleviating factors noted. Pt denies fever, chills, cough, loss of appetite, or any other pertinent symptoms. Pt does not drink or smoke.   Past Medical History  Diagnosis Date  . Diabetes mellitus   . Bipolar disorder ([place])   . Depression   . Anxiety   . Hypertension   . Anemia   . Schizoaffective disorder [facility])    Past Surgical History  Procedure Laterality Date  . Cesarean section    . Tubal ligation      X3  . Cholecystectomy  06/02/2012    Procedure: LAPAROSCOPIC CHOLECYSTECTOMY;  Surgeon: Jamesetta So, MD;  Location: [facility] ORS;  Service: General;  Laterality: N/A;  Attempted laparoscopic cholecystectomy  . Cholecystectomy  06/02/2012    Procedure: CHOLECYSTECTOMY;  Surgeon: Jamesetta So, MD;  Location: [facility] ORS;  Service: General;  Laterality: N/A;   converted to open at  0905  . Breast surgery    . Esophagogastroduodenoscopy (egd) with propofol N/A 08/24/2015    Procedure: ESOPHAGOGASTRODUODENOSCOPY (EGD) WITH PROPOFOL;  Surgeon: Rogene Houston, MD;  Location: [facility] ENDO SUITE;  Service: Endoscopy;  Laterality: N/A;  1:10 - Ann to notify pt to arrive at 11:30   Family History  Problem Relation Age of Onset  . Stroke Other   . Diabetes Other   . Cancer Other   . Seizures Other   . Hypertension Mother   . Alcohol abuse Mother   . Hypertension Father   . Alcohol abuse Sister   . Alcohol abuse Maternal Aunt   . Alcohol abuse Paternal Aunt   . Alcohol abuse Maternal Grandfather   . Alcohol abuse Maternal Grandmother   . Alcohol abuse Cousin    Social History  Substance Use Topics  . Smoking status: Never Smoker   . Smokeless tobacco: Never Used  . Alcohol Use: No   OB History    Gravida Para Term Preterm AB TAB SAB Ectopic Multiple Living   3 3 3       3      Review of Systems A complete 10 system review of systems was obtained and all systems are negative except as noted in the HPI and PMH.    Allergies  Review of patient's allergies indicates no known allergies.  Home Medications   Prior to Admission medications   Medication Sig Start Date End Date Taking? Authorizing Provider  acarbose (PRECOSE) 100 MG tablet Take 100 mg by mouth 3 (three) times daily with meals.    Yes Historical Provider, MD  albuterol (PROVENTIL HFA;VENTOLIN HFA) 108 (90 BASE) MCG/ACT inhaler Inhale 2 puffs into the lungs every 6 (six) hours as needed for wheezing. Reported on 10/03/2015   Yes Historical Provider, MD  aspirin EC 81 MG tablet Take 81 mg by mouth daily.   Yes Historical Provider, MD  atorvastatin (LIPITOR) 10 MG tablet Take 1 tablet by mouth daily. 05/04/15  Yes Historical Provider, MD  DULoxetine (CYMBALTA) 60 MG capsule Take 1 capsule (60 mg total) by mouth daily at 6 PM. 09/18/15  Yes Cloria Spring, MD  gabapentin (NEURONTIN) 300 MG  capsule Take 300 mg by mouth 2 (two) times daily. 03/28/15  Yes Historical Provider, MD  insulin lispro (HUMALOG) 100 UNIT/ML injection Inject into the skin 3 (three) times daily before meals.   Yes Historical Provider, MD  levothyroxine (SYNTHROID, LEVOTHROID) 25 MCG tablet Take 25 mcg by mouth daily before breakfast.  04/15/15  Yes Historical Provider, MD  lisinopril (PRINIVIL,ZESTRIL) 10 MG tablet Take 1 tablet by mouth daily. Reported on 10/08/2015 05/04/15  Yes Historical Provider, MD  metFORMIN (GLUCOPHAGE) 1000 MG tablet Take 1,000 mg by mouth 2 (two) times daily. 04/20/13  Yes Historical Provider, MD  omeprazole (PRILOSEC) 20 MG capsule Take 20 mg by mouth at bedtime.    Yes Historical Provider, MD  pioglitazone (ACTOS) 15 MG tablet Take 15 mg by mouth daily.   Yes Historical Provider, MD  risperiDONE (RISPERDAL) 1 MG tablet Take 1 tablet (1 mg total) by mouth every morning. 09/18/15 09/17/16 Yes Cloria Spring, MD  risperiDONE (RISPERDAL) 3 MG tablet Take 1 tablet (3 mg total) by mouth at bedtime. 09/18/15 09/17/16 Yes Cloria Spring, MD  TOUJEO SOLOSTAR 300 UNIT/ML SOPN Inject 80 Units into the skin at bedtime.  04/15/15  Yes Historical Provider, MD  traZODone (DESYREL) 100 MG tablet Take 200 mg by mouth at bedtime.    Yes Historical Provider, MD  Vitamin D, Ergocalciferol, (DRISDOL) 50000 units CAPS capsule Take 50,000 Units by mouth every 7 (seven) days.   Yes Historical Provider, MD  benztropine (COGENTIN) 1 MG tablet Take 1 tablet (1 mg total) by mouth at bedtime. Patient not taking: Reported on 10/29/2015 09/18/15 09/17/16  Cloria Spring, MD   BP 169/95 mmHg  Pulse 95  Temp(Src) 98.6 F (37 C) (Oral)  Resp 18  Ht 5' (1.524 m)  Wt 164 lb (74.39 kg)  BMI 32.03 kg/m2  SpO2 100%  LMP 01/23/2013 Physical Exam  Constitutional: She is oriented to person, place, and time. She appears well-developed and well-nourished. No distress.  HENT:  Head: Normocephalic and atraumatic.  Eyes: EOM are  normal.  Neck: Normal range of motion.  Cardiovascular: Normal rate, regular rhythm and normal heart sounds.  Exam reveals no gallop.   No murmur heard. Pulmonary/Chest: Effort normal and breath sounds normal. No respiratory distress. She has no wheezes. She has no rales.  Abdominal: Soft. She exhibits no distension. There is tenderness. There is guarding. There is no CVA tenderness.  Pain and guarding in LUQ of abdomen. No CVA TTP.   Musculoskeletal: Normal range of motion.  Neurological: She is alert and oriented to person, place, and time.  Skin: Skin is warm and dry. No rash noted.  Psychiatric: She has a normal mood and affect. Judgment normal.  Nursing note and vitals reviewed.   ED Course  Procedures (  including critical care time) DIAGNOSTIC STUDIES: Oxygen Saturation is 100% on RA, normal by my interpretation.    COORDINATION OF CARE: 9:25 AM Discussed treatment plan with pt at bedside and pt agreed to plan.   Labs Review Labs Reviewed  CBC WITH DIFFERENTIAL/PLATELET - Abnormal; Notable for the following:    Hemoglobin 11.7 (*)    HCT 34.7 (*)    All other components within normal limits  COMPREHENSIVE METABOLIC PANEL - Abnormal; Notable for the following:    Potassium 3.4 (*)    Glucose, Bld 217 (*)    BUN 21 (*)    AST 14 (*)    All other components within normal limits  URINALYSIS, ROUTINE W REFLEX MICROSCOPIC (NOT AT [facility])  LIPASE, BLOOD    Imaging Review No results found. I have personally reviewed and evaluated these images and lab results as part of my medical decision-making.   EKG Interpretation None      MDM   Final diagnoses:  Left upper quadrant pain   Patient with a history of diabetes, bipolar disease, hypertension, schizoaffective disorder with recent hospitalization due to lithium overdose unintentionally presents today with 2 weeks of worsening left-sided upper quadrant pain that's worse with certain positions and with eating. She also has  nausea with occasional vomiting. No diarrhea or fever. Patient has been taking ibuprofen without improvement. She does take Prilosec daily. She denies any alcohol use and no drug use.   Patient has left upper quadrant tenderness on exam but no CVA tenderness or lower abdominal tenderness concerning for appendicitis, cholecystitis, hepatitis or diverticulitis. Concern for possible pancreatitis but labs are all within normal limits. Urine is normal without hematuria or infection. Low suspicion for kidney stone or pyelonephritis at this time.  Feel that patient may have gastritis as a lot of her symptoms are related to eating and she's been using ibuprofen. Patient was started on Carafate and Zantac she was instructed to follow-up with her PCP in one week if symptoms are not improving.  I personally performed the services described in this documentation, which was scribed in my presence.  The recorded information has been reviewed and considered.    Blanchie Dessert, MD 10/29/15 1352

## 2015-10-29 NOTE — ED Notes (Signed)
Pt made aware to return if symptoms worsen or if any life threatening symptoms occur.   

## 2015-10-29 NOTE — Discharge Instructions (Signed)

## 2015-11-01 ENCOUNTER — Ambulatory Visit: Payer: Self-pay | Admitting: Orthopedic Surgery

## 2015-11-02 ENCOUNTER — Other Ambulatory Visit: Payer: Self-pay | Admitting: Pharmacist

## 2015-11-02 NOTE — Patient Outreach (Signed)
[facility]) Care Management  [place]   11/02/2015  Kathryn [name] 02-17-1971 [id number]  Subjective:  [place] referral received from [facility], [place] Coordinator for medication assistance.    Called patient who answered call and verified her name and date of birth.  She reports that she gets her medications at [facility] and that her PCP gives her samples of her insulin at times.   She reports that Linzess is the medication that is expensive---it is not on her active medication list, but was mentioned as taking in 10/08/15 GI note in patient's chart.    Patient reviewed her medication list over the phone with pharmacist.   She confirmed that she has Partial Extra Help from [facility].    Patient states that she isn't sure how much sucralfate is, which the ED prescribed her this week, but she remembers the liquid sucralfate was very expensive.   She asked if pharmacist would call [facility] in [place] to verify her co-pay.    Objective:   Current Medications: Current Outpatient Prescriptions  Medication Sig Dispense Refill  . acarbose (PRECOSE) 100 MG tablet Take 100 mg by mouth 3 (three) times daily with meals.     Marland Kitchen albuterol (PROVENTIL HFA;VENTOLIN HFA) 108 (90 BASE) MCG/ACT inhaler Inhale 2 puffs into the lungs every 6 (six) hours as needed for wheezing. Reported on 10/03/2015    . aspirin EC 81 MG tablet Take 81 mg by mouth daily.    Marland Kitchen atorvastatin (LIPITOR) 10 MG tablet Take 1 tablet by mouth daily.    . benztropine (COGENTIN) 1 MG tablet Take 1 tablet (1 mg total) by mouth at bedtime. 30 tablet 2  . DULoxetine (CYMBALTA) 60 MG capsule Take 1 capsule (60 mg total) by mouth daily at 6 PM. 30 capsule 2  . gabapentin (NEURONTIN) 300 MG capsule Take 300 mg by mouth 2 (two) times daily.    . insulin lispro (HUMALOG) 100 UNIT/ML injection Inject into the skin 3 (three) times daily before meals.    Marland Kitchen levothyroxine (SYNTHROID, LEVOTHROID) 25 MCG tablet  Take 25 mcg by mouth daily before breakfast.     . lisinopril (PRINIVIL,ZESTRIL) 10 MG tablet Take 1 tablet by mouth daily. Reported on 10/08/2015    . metFORMIN (GLUCOPHAGE) 1000 MG tablet Take 1,000 mg by mouth 2 (two) times daily.    Marland Kitchen omeprazole (PRILOSEC) 20 MG capsule Take 20 mg by mouth at bedtime.     . pioglitazone (ACTOS) 15 MG tablet Take 15 mg by mouth daily.    . risperiDONE (RISPERDAL) 1 MG tablet Take 1 tablet (1 mg total) by mouth every morning. 30 tablet 2  . risperiDONE (RISPERDAL) 3 MG tablet Take 1 tablet (3 mg total) by mouth at bedtime. 30 tablet 2  . TOUJEO SOLOSTAR 300 UNIT/ML SOPN Inject 80 Units into the skin at bedtime.     . traZODone (DESYREL) 100 MG tablet Take 200 mg by mouth at bedtime.     . Vitamin D, Ergocalciferol, (DRISDOL) 50000 units CAPS capsule Take 50,000 Units by mouth every 7 (seven) days.    Marland Kitchen HYDROcodone-acetaminophen (NORCO/VICODIN) 5-325 MG tablet Take 1 tablet by mouth every 6 (six) hours as needed. 10 tablet 0  . ondansetron (ZOFRAN) 4 MG tablet Take 1 tablet (4 mg total) by mouth every 8 (eight) hours as needed for nausea or vomiting. 12 tablet 0  . ranitidine (ZANTAC) 150 MG tablet Take 1 tablet (150 mg total) by mouth 2 (  two) times daily. 28 tablet 0  . sucralfate (CARAFATE) 1 g tablet Take 1 tablet (1 g total) by mouth 4 (four) times daily -  with meals and at bedtime. 60 tablet 1   No current facility-administered medications for this visit.    Functional Status: In your present state of health, do you have any difficulty performing the following activities: 10/05/2015 10/05/2015  Hearing? N N  Vision? N N  Difficulty concentrating or making decisions? N N  Walking or climbing stairs? N N  Dressing or bathing? N N  Doing errands, shopping? N N  Preparing Food and eating ? N N  Using the Toilet? N N  In the past six months, have you accidently leaked urine? N N  Do you have problems with loss of bowel control? N N  Managing your  Medications? N N  Managing your Finances? [name]  Housekeeping or managing your Housekeeping? N N    Fall/Depression Screening: PHQ 2/9 Scores 10/18/2015 10/05/2015 10/05/2015 09/20/2015  PHQ - 2 Score '2 2 2 2  ' PHQ- 9 Score '10 10 10 10    ' Assessment:  Patient assistance:   Patient has Partial Extra Help from [employer] which limits patient assistance options from pharmaceutical manufacturers---the [employer] patient assistance program mentions that Part D beneficiaries should be denied [employer] LIS to apply.    Patient could consider obtaining 90 day supplies of her maintenance medications to see if her cost burden is reduced versus 30 day supplies.    Patient could also consider applying to [employer] to attempt to obtain Cymbalta and Humalog from [employer] patient assistance as the program appears to consider applicants who do not have Full Extra Help from [place].    Called [facility] to verify co-pay on sucralfate and was told it was $0.60.    Plan:  Patient will need to get a report from her pharmacy showing her out-of-pocket spend to determine if she has met requirement to attempt to apply to [employer].   Attempted to call patient back with co-pay information for sucralfate that was obtained from [place], but she did not answer, so a HIPAA compliant message was left requesting return call.   Will attempt to reach patient again next week to further discuss patient assistance options.    Karrie Meres, PharmD, [place] [phone]

## 2015-11-06 ENCOUNTER — Ambulatory Visit: Payer: Self-pay | Admitting: Pharmacist

## 2015-11-08 ENCOUNTER — Other Ambulatory Visit: Payer: Self-pay | Admitting: Pharmacist

## 2015-11-08 NOTE — Patient Outreach (Signed)
[facility]) Care Management  [place]   11/08/2015  Kathryn [name] 1970-11-16 [id number]  Subjective:  Second attempt to reach patient to follow-up on medication assistance. Patient answered call and verified her name and date of birth.   Patient confirmed that she knew she has Partial Extra Help from [facility] and reports she understands this limits manufacturer patient assistance options.    Patient reports that she has been having increased issues with flatulence---she also appears to have discussed this at her last GI office visit per review of that note in chart.   She reports she has some 90 day prescriptions for her medications from Dr Maudie Mercury, but no all of her medications from Dr Maudie Mercury are for 90 day supplies per her report.  She reports her psychiatric medications will be only be issued for monthly fills per the MD.    She reports she has an appointment with her PCP on 11/13/15.   Objective:   Current Medications: Current Outpatient Prescriptions  Medication Sig Dispense Refill  . acarbose (PRECOSE) 100 MG tablet Take 100 mg by mouth 3 (three) times daily with meals.     Marland Kitchen albuterol (PROVENTIL HFA;VENTOLIN HFA) 108 (90 BASE) MCG/ACT inhaler Inhale 2 puffs into the lungs every 6 (six) hours as needed for wheezing. Reported on 10/03/2015    . aspirin EC 81 MG tablet Take 81 mg by mouth daily.    Marland Kitchen atorvastatin (LIPITOR) 10 MG tablet Take 1 tablet by mouth daily.    . benztropine (COGENTIN) 1 MG tablet Take 1 tablet (1 mg total) by mouth at bedtime. 30 tablet 2  . DULoxetine (CYMBALTA) 60 MG capsule Take 1 capsule (60 mg total) by mouth daily at 6 PM. 30 capsule 2  . gabapentin (NEURONTIN) 300 MG capsule Take 300 mg by mouth 2 (two) times daily.    Marland Kitchen HYDROcodone-acetaminophen (NORCO/VICODIN) 5-325 MG tablet Take 1 tablet by mouth every 6 (six) hours as needed. 10 tablet 0  . insulin lispro (HUMALOG) 100 UNIT/ML injection Inject into the skin 3 (three) times daily  before meals.    Marland Kitchen levothyroxine (SYNTHROID, LEVOTHROID) 25 MCG tablet Take 25 mcg by mouth daily before breakfast.     . lisinopril (PRINIVIL,ZESTRIL) 10 MG tablet Take 1 tablet by mouth daily. Reported on 10/08/2015    . metFORMIN (GLUCOPHAGE) 1000 MG tablet Take 1,000 mg by mouth 2 (two) times daily.    Marland Kitchen omeprazole (PRILOSEC) 20 MG capsule Take 20 mg by mouth at bedtime.     . ondansetron (ZOFRAN) 4 MG tablet Take 1 tablet (4 mg total) by mouth every 8 (eight) hours as needed for nausea or vomiting. 12 tablet 0  . pioglitazone (ACTOS) 15 MG tablet Take 15 mg by mouth daily.    . ranitidine (ZANTAC) 150 MG tablet Take 1 tablet (150 mg total) by mouth 2 (two) times daily. 28 tablet 0  . risperiDONE (RISPERDAL) 1 MG tablet Take 1 tablet (1 mg total) by mouth every morning. 30 tablet 2  . risperiDONE (RISPERDAL) 3 MG tablet Take 1 tablet (3 mg total) by mouth at bedtime. 30 tablet 2  . sucralfate (CARAFATE) 1 g tablet Take 1 tablet (1 g total) by mouth 4 (four) times daily -  with meals and at bedtime. 60 tablet 1  . TOUJEO SOLOSTAR 300 UNIT/ML SOPN Inject 80 Units into the skin at bedtime.     . traZODone (DESYREL) 100 MG tablet Take 200 mg by mouth at bedtime.     Marland Kitchen  Vitamin D, Ergocalciferol, (DRISDOL) 50000 units CAPS capsule Take 50,000 Units by mouth every 7 (seven) days.     No current facility-administered medications for this visit.    Functional Status: In your present state of health, do you have any difficulty performing the following activities: 10/05/2015 10/05/2015  Hearing? N N  Vision? N N  Difficulty concentrating or making decisions? N N  Walking or climbing stairs? N N  Dressing or bathing? N N  Doing errands, shopping? N N  Preparing Food and eating ? N N  Using the Toilet? N N  In the past six months, have you accidently leaked urine? N N  Do you have problems with loss of bowel control? N N  Managing your Medications? N N  Managing your Finances? [name]  Housekeeping  or managing your Housekeeping? N N    Fall/Depression Screening: PHQ 2/9 Scores 10/18/2015 10/05/2015 10/05/2015 09/20/2015  PHQ - 2 Score 2 2 2 2   PHQ- 9 Score 10 10 10 10     Assessment:  Medication assistance:  Informed patient that sucralfate tablets are covered on her [place]-PDP as Tier 2 but the liquid is brand only and is Tier 4--this may be why she was initially unable to afford sucralfate as liquid was previously prescribed for her.  She states she can afford the co-pay for the tablets.    Linzess:  The manufacturer requires Part D beneficiaries not be eligible for [facility] Extra Help.   [place] Cares:  Discussed that program appears to exclude Part D beneficiaries who are eligible for Full Extra Help---patient has Partial Extra Help so discussed patient she could try to apply to the program if she has spent at least $1,100 on out-of-pocket prescription drug costs during the calendar year which she doesn't believe she has.    Patient may have cost savings if she obtains 90 day supplies of her maintenance medications.    Plan:  1) Suggest patient obtain 90 day prescriptions for her maintenance primary care medications.   2) Given patient's report of increased flatulence, suggest consideration to discontinuing acarbose if therapeutically appropriate as patient is also presently on basal and bolus insulin.  Acarbose is associated with GI adverse events such as flatulence.    3) Will place follow-up phone call to patient in about 1 week after she sees her PCP.   This note and barriers letter routed to patient's PCP.    Karrie Meres, PharmD, [place] [phone]

## 2015-11-09 ENCOUNTER — Ambulatory Visit: Payer: Self-pay | Admitting: Licensed Clinical Social Worker

## 2015-11-09 ENCOUNTER — Encounter: Payer: Self-pay | Admitting: Pharmacist

## 2015-11-12 ENCOUNTER — Other Ambulatory Visit: Payer: Self-pay | Admitting: Licensed Clinical Social Worker

## 2015-11-12 NOTE — Patient Outreach (Signed)
Assessment:  CSW spoke via phone with client on 11/12/15. CSW verified client identity. CSW received verbal permission from client on 11/12/15 for CSW to speak with client about current client needs. Client sees Dr. [name] as her primary care doctor. Client has appointment with Dr. Maudie Mercury on 11/13/15.   She sees Dr. Harrington Challenger, as psychiatrist, for mental health care. Client has appointment with Dr. Harrington Challenger on 12/12/2015. Client said she is eating well and sleeping well.  CSW had previously provided client with information on affordable housing options in [place] ,[place] and in [place], [place]. Client said she had contacted several apartment complexes in [place] to inquire about apartment availability. Client said she is interested in looking at affordable housing in [place], [place]. [place] informed client that [place] would mail client a list of affordable housing locations in [place], [place].  Client also said she would talk with Dr. Maudie Mercury about peripheral neuropathy issues at her appointment tomorrow with Dr. Maudie Mercury.  Client receives [place] support with RN Burgess Amor.  Client said she had also spoken via phone with Isabell Jarvis, [facility] pharmacist, regarding medication needs of client.   Client said that her spouse transports her to and from her scheduled appointments.  CSW and client spoke of client care plan. CSW encouraged client to communicate with CSW as needed in next 30 days to discuss housing options for client in the area. CSW thanked client for phone conversation with CSW on 11/12/15  Plan:  Client to communicate with CSW in next 30 days as needed to discuss affordable housing options for client in the area. CSW to call client in 4 weeks to assess needs of client at that time.   Norva Riffle.Bassy Fetterly MSW, LCSW Licensed Clinical Social Worker [facility] Care Management [phone]

## 2015-11-16 ENCOUNTER — Ambulatory Visit: Payer: Self-pay | Admitting: Pharmacist

## 2015-11-19 ENCOUNTER — Other Ambulatory Visit: Payer: Self-pay | Admitting: Pharmacist

## 2015-11-19 NOTE — Patient Outreach (Signed)
[facility]) Care Management  11/19/2015  Kathryn [name] 1970-09-27 [id number]  Unsuccessful attempt to reach patient to follow-up on her pharmacy needs.  A female answered the phone and stated patient was not available.  HIPAA compliant message left requesting a call back from patient.   Plan:  Will attempt to reach patient next week if no return call.    Karrie Meres, PharmD, [place] [phone]

## 2015-11-20 ENCOUNTER — Other Ambulatory Visit: Payer: Self-pay | Admitting: *Deleted

## 2015-11-20 NOTE — Patient Outreach (Signed)
Call to patient to schedule visit. Patient agrees to visit tomorrow. Kathryn [name]. [name], RN, BSN, [place] [phone]

## 2015-11-21 ENCOUNTER — Encounter: Payer: Self-pay | Admitting: *Deleted

## 2015-11-21 ENCOUNTER — Other Ambulatory Visit: Payer: Self-pay | Admitting: *Deleted

## 2015-11-21 NOTE — Patient Outreach (Signed)
[place] [facility]) Care Management   11/21/2015  Kathryn [name] 11/29/1970 [id number]  Kathryn [name] is an 45 y.o. female  Subjective:  Patient reports she is doing pretty well. She has had some spells with low blood sugar.  She has reviewed diabetes educational material and understands how to treat low blood sugar.  Objective:   BP 162/90 mmHg  Pulse 90  Resp 20  SpO2 98%  LMP 01/23/2013 Review of Systems  Constitutional: Negative.   HENT: Negative.   Eyes: Negative.   Cardiovascular: Negative.   Gastrointestinal: Negative.   Genitourinary: Negative.   Musculoskeletal: Positive for back pain.       Now taking muscle relaxant  Skin: Negative.   Neurological:       Neuropathy   Psychiatric/Behavioral: Negative.     Physical Exam  Constitutional: She is oriented to person, place, and time. She appears well-developed and well-nourished.  Cardiovascular: Normal rate.   Respiratory: Effort normal.  GI: Soft.  Musculoskeletal: Normal range of motion.  Neurological: She is alert and oriented to person, place, and time.  Skin: Skin is warm.    Encounter Medications:   Outpatient Encounter Prescriptions as of 11/21/2015  Medication Sig Note  . albuterol (PROVENTIL HFA;VENTOLIN HFA) 108 (90 BASE) MCG/ACT inhaler Inhale 2 puffs into the lungs every 6 (six) hours as needed for wheezing. Reported on 10/03/2015   . aspirin EC 81 MG tablet Take 81 mg by mouth daily.   Marland Kitchen atorvastatin (LIPITOR) 10 MG tablet Take 1 tablet by mouth daily.   . benztropine (COGENTIN) 1 MG tablet Take 1 tablet (1 mg total) by mouth at bedtime.   . DULoxetine (CYMBALTA) 60 MG capsule Take 1 capsule (60 mg total) by mouth daily at 6 PM.   . gabapentin (NEURONTIN) 300 MG capsule Take 300 mg by mouth 2 (two) times daily. 11/21/2015: Increased 1 tid Dr. Maudie Mercury  . HYDROcodone-acetaminophen (NORCO/VICODIN) 5-325 MG tablet Take 1 tablet by mouth every 6 (six) hours as needed.   . insulin lispro (HUMALOG)  100 UNIT/ML injection Inject into the skin 3 (three) times daily before meals. 10/03/2015: 15u 3x day  . levothyroxine (SYNTHROID, LEVOTHROID) 25 MCG tablet Take 25 mcg by mouth daily before breakfast.    . lisinopril (PRINIVIL,ZESTRIL) 10 MG tablet Take 1 tablet by mouth daily. Reported on 10/08/2015 11/21/2015: Dose increased to 67m   . metFORMIN (GLUCOPHAGE) 1000 MG tablet Take 1,000 mg by mouth 2 (two) times daily. 11/21/2015: Needs prescription  . methocarbamol (ROBAXIN) 500 MG tablet Take 500 mg by mouth every 6 (six) hours as needed for muscle spasms.   . Methylfol-Methylcob-Acetylcyst (CEREFOLIN NAC) 6-2-600 MG TABS Take by mouth once.   .Marland Kitchenomeprazole (PRILOSEC) 20 MG capsule Take 20 mg by mouth at bedtime.    . pioglitazone (ACTOS) 15 MG tablet Take 15 mg by mouth daily.   . risperiDONE (RISPERDAL) 1 MG tablet Take 1 tablet (1 mg total) by mouth every morning.   . risperiDONE (RISPERDAL) 3 MG tablet Take 1 tablet (3 mg total) by mouth at bedtime.   .Kathryn [name] 300 UNIT/ML SOPN Inject 80 Units into the skin at bedtime.  10/03/2015: Increased to 76u qhs  . traZODone (DESYREL) 100 MG tablet Take 200 mg by mouth at bedtime.    . Vitamin D, Ergocalciferol, (DRISDOL) 50000 units CAPS capsule Take 50,000 Units by mouth every 7 (seven) days.   .Marland Kitchenacarbose (PRECOSE) 100 MG tablet Take 100 mg by mouth 3 (three)  times daily with meals. Reported on 11/21/2015 10/03/2015: 3x day per bottle and patient  . ondansetron (ZOFRAN) 4 MG tablet Take 1 tablet (4 mg total) by mouth every 8 (eight) hours as needed for nausea or vomiting. (Patient not taking: Reported on 11/21/2015)   . ranitidine (ZANTAC) 150 MG tablet Take 1 tablet (150 mg total) by mouth 2 (two) times daily. (Patient not taking: Reported on 11/21/2015)   . sucralfate (CARAFATE) 1 g tablet Take 1 tablet (1 g total) by mouth 4 (four) times daily -  with meals and at bedtime.    No facility-administered encounter medications on file as of 11/21/2015.     Functional Status:   In your present state of health, do you have any difficulty performing the following activities: 10/05/2015 10/05/2015  Hearing? N N  Vision? N N  Difficulty concentrating or making decisions? N N  Walking or climbing stairs? N N  Dressing or bathing? N N  Doing errands, shopping? N N  Preparing Food and eating ? N N  Using the Toilet? N N  In the past six months, have you accidently leaked urine? N N  Do you have problems with loss of bowel control? N N  Managing your Medications? N N  Managing your Finances? [name]  Housekeeping or managing your Housekeeping? N N    Fall/Depression Screening:    PHQ 2/9 Scores 11/12/2015 10/18/2015 10/05/2015 10/05/2015 09/20/2015  PHQ - 2 Score '2 2 2 2 2  ' PHQ- 9 Score '10 10 10 10 10   ' Fall Risk  11/12/2015 10/18/2015 10/05/2015 10/05/2015 10/03/2015  Falls in the past year? No No No No No  Risk for fall due to : Medication side effect Medication side effect Medication side effect Medication side effect -    Assessment:   Diabetes: Educated patient on new medication BP: MD increased BP medication,  Patient needs a BP cuff, advised her to check BP at [facility] until able to get cuff, advised to report high readings Medication Management:Patient working with [facility] pharmacist on medication Housing: [place] with housing issue  Plan: [place] Problem One        Most Recent Value   Care Plan Problem One  Medication management   Role Documenting the Problem One  Care Management [place] for Problem One  Active   THN Long Term Goal (31-90 days)  Patient will have all needed medications over the next 60 days   THN Long Term Goal Start Date  10/03/15   Interventions for Problem One Long Term Goal  reviewed medications, also verified that [facility] pharmacist working with patient on pharmacy assistance    [facility] CM Care Plan Problem Two        Most Recent Value   Care Plan Problem Two  knowledge deficit related to  diabetes as evidenced by questions around diabetes from patient   Role Documenting the Problem Two  Care Management [place] for Problem Two  Active   Interventions for Problem Two Long Term Goal   Using teachback reviewed tx of hypoglycemia   THN Long Term Goal (31-90) days  Patient will have knowledge of diabetic diet over the next 90 days   THN Long Term Goal Start Date  10/03/15   THN CM Short Term Goal #1 (0-30 days)  Patient will report CBG readings below 250 over the next 21 days   THN CM Short Term Goal #1 Start  Date  10/03/15   THN CM Short Term Goal #1 Met Date   11/21/15   Interventions for Short Term Goal #2   Reviewed cbg averages     Bring BP cuff next week and teach how to check BP  Stanton Kidney E. [name], RN, BSN, [place] [phone]

## 2015-11-28 ENCOUNTER — Other Ambulatory Visit: Payer: Self-pay | Admitting: Pharmacist

## 2015-11-29 ENCOUNTER — Other Ambulatory Visit: Payer: Self-pay | Admitting: *Deleted

## 2015-11-29 ENCOUNTER — Encounter: Payer: Self-pay | Admitting: *Deleted

## 2015-11-29 VITALS — BP 140/89 | HR 96

## 2015-11-29 DIAGNOSIS — E118 Type 2 diabetes mellitus with unspecified complications: Secondary | ICD-10-CM

## 2015-11-29 NOTE — Addendum Note (Signed)
Addended by: Burgess Amor E on: 11/29/2015 06:00 PM   Modules accepted: Orders

## 2015-11-29 NOTE — Patient Outreach (Signed)
[place] [facility]) Care Management  [place]   11/29/2015  Kathryn [name] May 26, 1971 [id number]  Late entry for 11/28/15.     Subjective:  Spoke with patient via phone, she verified her name, date of birth, address.  Patient states she saw her PCP the end of June and that her PCP discontinued her acarbose.     Patient states that since PCP discontinued acarbose some of her GI symptoms of flatulence have improved.   She also states that she received 90 day supplies of her other maintenance medications from her PCP.    Objective:   Current Medications: Current Outpatient Prescriptions  Medication Sig Dispense Refill  . acarbose (PRECOSE) 100 MG tablet Take 100 mg by mouth 3 (three) times daily with meals. Reported on 11/21/2015    . albuterol (PROVENTIL HFA;VENTOLIN HFA) 108 (90 BASE) MCG/ACT inhaler Inhale 2 puffs into the lungs every 6 (six) hours as needed for wheezing. Reported on 10/03/2015    . aspirin EC 81 MG tablet Take 81 mg by mouth daily.    Marland Kitchen atorvastatin (LIPITOR) 10 MG tablet Take 1 tablet by mouth daily.    . benztropine (COGENTIN) 1 MG tablet Take 1 tablet (1 mg total) by mouth at bedtime. 30 tablet 2  . DULoxetine (CYMBALTA) 60 MG capsule Take 1 capsule (60 mg total) by mouth daily at 6 PM. 30 capsule 2  . gabapentin (NEURONTIN) 300 MG capsule Take 300 mg by mouth 2 (two) times daily.    Marland Kitchen HYDROcodone-acetaminophen (NORCO/VICODIN) 5-325 MG tablet Take 1 tablet by mouth every 6 (six) hours as needed. 10 tablet 0  . insulin lispro (HUMALOG) 100 UNIT/ML injection Inject into the skin 3 (three) times daily before meals.    Marland Kitchen levothyroxine (SYNTHROID, LEVOTHROID) 25 MCG tablet Take 25 mcg by mouth daily before breakfast.     . lisinopril (PRINIVIL,ZESTRIL) 10 MG tablet Take 1 tablet by mouth daily. Reported on 10/08/2015    . metFORMIN (GLUCOPHAGE) 1000 MG tablet Take 1,000 mg by mouth 2 (two) times daily.    . methocarbamol (ROBAXIN) 500 MG tablet Take 500 mg  by mouth every 6 (six) hours as needed for muscle spasms.    . Methylfol-Methylcob-Acetylcyst (CEREFOLIN NAC) 6-2-600 MG TABS Take by mouth once.    Marland Kitchen omeprazole (PRILOSEC) 20 MG capsule Take 20 mg by mouth at bedtime.     . ondansetron (ZOFRAN) 4 MG tablet Take 1 tablet (4 mg total) by mouth every 8 (eight) hours as needed for nausea or vomiting. (Patient not taking: Reported on 11/21/2015) 12 tablet 0  . pioglitazone (ACTOS) 15 MG tablet Take 15 mg by mouth daily.    . ranitidine (ZANTAC) 150 MG tablet Take 1 tablet (150 mg total) by mouth 2 (two) times daily. (Patient not taking: Reported on 11/21/2015) 28 tablet 0  . risperiDONE (RISPERDAL) 1 MG tablet Take 1 tablet (1 mg total) by mouth every morning. 30 tablet 2  . risperiDONE (RISPERDAL) 3 MG tablet Take 1 tablet (3 mg total) by mouth at bedtime. 30 tablet 2  . sucralfate (CARAFATE) 1 g tablet Take 1 tablet (1 g total) by mouth 4 (four) times daily -  with meals and at bedtime. 60 tablet 1  . TOUJEO SOLOSTAR 300 UNIT/ML SOPN Inject 80 Units into the skin at bedtime.     . traZODone (DESYREL) 100 MG tablet Take 200 mg by mouth at bedtime.     . Vitamin D, Ergocalciferol, (DRISDOL) 50000 units CAPS  capsule Take 50,000 Units by mouth every 7 (seven) days.     No current facility-administered medications for this visit.    Functional Status: In your present state of health, do you have any difficulty performing the following activities: 10/05/2015 10/05/2015  Hearing? N N  Vision? N N  Difficulty concentrating or making decisions? N N  Walking or climbing stairs? N N  Dressing or bathing? N N  Doing errands, shopping? N N  Preparing Food and eating ? N N  Using the Toilet? N N  In the past six months, have you accidently leaked urine? N N  Do you have problems with loss of bowel control? N N  Managing your Medications? N N  Managing your Finances? Kathryn [name]  Housekeeping or managing your Housekeeping? N N    Fall/Depression Screening: PHQ  2/9 Scores 11/12/2015 10/18/2015 10/05/2015 10/05/2015 09/20/2015  PHQ - 2 Score 2 2 2 2 2   PHQ- 9 Score 10 10 10 10 10     Assessment:  Counseled patient again that with her having Partial SSA Extra Help, manufacturer patient assistance options are limited and she may have some cost savings with a 90 day supply of her maintenance medications.   Per patient report, PCP discontinued acarbose.   Patient denies any other needs at this time but would prefer a follow-up phone call in 2-3 weeks.    Plan:  Will make follow-up call to patient in about 2-3 weeks, if no further pharmacy needs at that time, pharmacy case closure will be considered.   Karrie Meres, PharmD, [place] [phone]

## 2015-11-29 NOTE — Patient Outreach (Signed)
[place] [facility]) Care Management   11/29/2015  [name] A Lekas Feb 09, 1971 [id number]  [name] A Bejarano is an 45 y.o. female  Subjective:  Patient reporting she is doing pretty well. She states she is still having some pain in her back and legs She has f/u appointment with Dr. Maudie Mercury in August but will see MD earlier if her back pain worsens.  Patient has followed up on apartment and is working on getting documents, she requests RNCM collaborate with SW on apartment and food pantries.   Patient able to perform BP reading and agrees to write out BP readings and report consistent high reading (over 140/90)  Objective:  BP 140/89 mmHg  Pulse 96  LMP 01/23/2013  Review of Systems  Constitutional: Negative.   HENT: Negative.   Eyes: Negative.   Respiratory: Negative.   Gastrointestinal: Negative.   Musculoskeletal: Positive for myalgias and joint pain.  Skin: Negative.   Neurological: Negative.   Endo/Heme/Allergies: Negative.   Psychiatric/Behavioral: Negative.     Physical Exam  Constitutional: She is oriented to person, place, and time. She appears well-developed and well-nourished.  HENT:  Head: Normocephalic.  Neck: Normal range of motion.  Respiratory: Effort normal.  Musculoskeletal: Normal range of motion.  Neurological: She is alert and oriented to person, place, and time.  Skin: Skin is warm and dry.    Encounter Medications:   Outpatient Encounter Prescriptions as of 11/29/2015  Medication Sig Note  . acarbose (PRECOSE) 100 MG tablet Take 100 mg by mouth 3 (three) times daily with meals. Reported on 11/21/2015 11/29/2015: Pt states her PCP stopped this medication  . albuterol (PROVENTIL HFA;VENTOLIN HFA) 108 (90 BASE) MCG/ACT inhaler Inhale 2 puffs into the lungs every 6 (six) hours as needed for wheezing. Reported on 10/03/2015   . aspirin EC 81 MG tablet Take 81 mg by mouth daily.   Marland Kitchen atorvastatin (LIPITOR) 10 MG tablet Take 1 tablet by mouth daily.   .  benztropine (COGENTIN) 1 MG tablet Take 1 tablet (1 mg total) by mouth at bedtime.   . DULoxetine (CYMBALTA) 60 MG capsule Take 1 capsule (60 mg total) by mouth daily at 6 PM.   . gabapentin (NEURONTIN) 300 MG capsule Take 300 mg by mouth 2 (two) times daily. 11/21/2015: Increased 1 tid Dr. Maudie Mercury  . HYDROcodone-acetaminophen (NORCO/VICODIN) 5-325 MG tablet Take 1 tablet by mouth every 6 (six) hours as needed.   . insulin lispro (HUMALOG) 100 UNIT/ML injection Inject into the skin 3 (three) times daily before meals. 10/03/2015: 15u 3x day  . levothyroxine (SYNTHROID, LEVOTHROID) 25 MCG tablet Take 25 mcg by mouth daily before breakfast.    . lisinopril (PRINIVIL,ZESTRIL) 10 MG tablet Take 1 tablet by mouth daily. Reported on 10/08/2015 11/21/2015: Dose increased to 66m   . metFORMIN (GLUCOPHAGE) 1000 MG tablet Take 1,000 mg by mouth 2 (two) times daily. 11/21/2015: Needs prescription  . methocarbamol (ROBAXIN) 500 MG tablet Take 500 mg by mouth every 6 (six) hours as needed for muscle spasms.   . Methylfol-Methylcob-Acetylcyst (CEREFOLIN NAC) 6-2-600 MG TABS Take by mouth once.   .Marland Kitchenomeprazole (PRILOSEC) 20 MG capsule Take 20 mg by mouth at bedtime.    . ondansetron (ZOFRAN) 4 MG tablet Take 1 tablet (4 mg total) by mouth every 8 (eight) hours as needed for nausea or vomiting. (Patient not taking: Reported on 11/21/2015)   . pioglitazone (ACTOS) 15 MG tablet Take 15 mg by mouth daily.   . ranitidine (ZANTAC) 150 MG tablet  Take 1 tablet (150 mg total) by mouth 2 (two) times daily. (Patient not taking: Reported on 11/21/2015)   . risperiDONE (RISPERDAL) 1 MG tablet Take 1 tablet (1 mg total) by mouth every morning.   . risperiDONE (RISPERDAL) 3 MG tablet Take 1 tablet (3 mg total) by mouth at bedtime.   . sucralfate (CARAFATE) 1 g tablet Take 1 tablet (1 g total) by mouth 4 (four) times daily -  with meals and at bedtime.   [name] SOLOSTAR 300 UNIT/ML SOPN Inject 80 Units into the skin at bedtime.  10/03/2015:  Increased to 76u qhs  . traZODone (DESYREL) 100 MG tablet Take 200 mg by mouth at bedtime.    . Vitamin D, Ergocalciferol, (DRISDOL) 50000 units CAPS capsule Take 50,000 Units by mouth every 7 (seven) days.    No facility-administered encounter medications on file as of 11/29/2015.    Functional Status:   In your present state of health, do you have any difficulty performing the following activities: 10/05/2015 10/05/2015  Hearing? N N  Vision? N N  Difficulty concentrating or making decisions? N N  Walking or climbing stairs? N N  Dressing or bathing? N N  Doing errands, shopping? N N  Preparing Food and eating ? N N  Using the Toilet? N N  In the past six months, have you accidently leaked urine? N N  Do you have problems with loss of bowel control? N N  Managing your Medications? N N  Managing your Finances? [name]  Housekeeping or managing your Housekeeping? N N    Fall/Depression Screening:    PHQ 2/9 Scores 11/12/2015 10/18/2015 10/05/2015 10/05/2015 09/20/2015  PHQ - 2 Score _0 PHQ- 9 Score _1 Assessment:   Diabetes: Patient reports blood sugar down to 138 today, agrees to transfer to health coach HTN: delivered BP cuff and instructed patient how to use cuff, patient able to demonstrate how to use cuff. Instructed patient to document blood pressures and show to MD. Financial concerns: will let [facility] SW know of questions about community resources  Plan:  [facility] CM Care Plan Problem One        Most Recent Value   Care Plan Problem One  Medication management   Role Documenting the Problem One  Care Management Coordinator   Care Plan for Problem One  Active   THN Long Term Goal (31-90 days)  Patient will have all needed medications over the next 60 days   THN Long Term Goal Start Date  10/03/15   Interventions for Problem One Long Term Goal  discussed medication and [facility] pharmacy involvement with patient    [facility] CM Care Plan Problem Two        Most Recent Value    Care Plan Problem Two  knowledge deficit related to diabetes as evidenced by questions around diabetes from patient   Role Documenting the Problem Two  Care Management Coordinator   Care Plan for Problem Two  Active   Interventions for Problem Two Long Term Goal   Discussed blood sugars readings, they are coming down, discussed referral to Health Coach for diabetes education   THN Long Term Goal (31-90) days  Patient will have knowledge of diabetic diet over the next 90 days   THN Long Term Goal Start Date  10/03/15   THN CM Short Term Goal #1 (0-30 days)  Patient will report CBG readings below 250 over the  next 21 days   THN CM Short Term Goal #1 Start Date  10/03/15   [facility] CM Short Term Goal #1 Met Date   11/21/15   Interventions for Short Term Goal #2   Reviewed cbg averages     Plan to refer to Health Coach for diabetes and close [place] will let [facility] SW know of patient apartment and food pantry resource questions.  Royetta Crochet. [name], RN, BSN, [place] [facility] Liaison [phone]

## 2015-12-03 ENCOUNTER — Other Ambulatory Visit (HOSPITAL_COMMUNITY): Payer: Self-pay | Admitting: Internal Medicine

## 2015-12-03 ENCOUNTER — Other Ambulatory Visit: Payer: Self-pay

## 2015-12-03 DIAGNOSIS — Z1231 Encounter for screening mammogram for malignant neoplasm of breast: Secondary | ICD-10-CM

## 2015-12-03 NOTE — Patient Outreach (Signed)
[place] [facility]) Care Management  12/03/2015  [name] 1971/03/03 [id number]  Telephone call to patient for introductory call.  Patient reports she is doing good.  Explained to patient health coach role.  Patient receptive to call and health coach role.   Plan: RN Health Coach will contact patient in the month of August and patient agrees to next outreach.  Jone Baseman, RN, MSN [place] [phone]

## 2015-12-06 ENCOUNTER — Ambulatory Visit (HOSPITAL_COMMUNITY): Payer: Self-pay

## 2015-12-12 ENCOUNTER — Ambulatory Visit (HOSPITAL_COMMUNITY): Payer: Self-pay | Admitting: Psychiatry

## 2015-12-12 ENCOUNTER — Ambulatory Visit (HOSPITAL_COMMUNITY): Payer: Self-pay

## 2015-12-13 ENCOUNTER — Other Ambulatory Visit: Payer: Self-pay | Admitting: Licensed Clinical Social Worker

## 2015-12-13 ENCOUNTER — Other Ambulatory Visit: Payer: Self-pay | Admitting: Pharmacist

## 2015-12-13 NOTE — Patient Outreach (Signed)
Assessment:  CSW spoke via phone with client on 12/13/15. CSW verified client identity. CSW and Kathryn [name] spoke of client needs. Client said she sees Dr. Maudie Mercury as her primary care doctor. She said she sees Dr. Harrington Challenger, psychiatrist, for her mental health needs. She said that appointments with Dr. Harrington Challenger were helpful to her.  She said she had her prescribed medications and is taking medications as prescribed. Client has been receiving support from [place] program Kathryn [name]). Client said she is eating well and sleeping well. CSW had previously provided client with affordable housing options in [place], [place] and in [place], [place]. Client said she has been contacting housing groups to check on housing availability. She said she would like to eventually live in [place], [place] if she is able to do so.  Client is now receiving telephonic support with [name], Kathryn Billings, RN.  Kathryn [name] plans to call client in August of 2017 to further discuss the health needs of client. CSW informed Kathryn [name] of [facility] and phone number of that agency. CSW encouraged client to call [facility] to inquire about applying for food assistance through that agency. CSW also informed client of local [employer] agency and food pantry at [employer].  CSW and client spoke of client care plan. CSW encouraged client to communicate with CSW in the next 30 days to discuss affordable housing options for client in the area. Client said she had been contacting local housing groups to discuss housing availability at those housing groups.  CSW encouraged that client call CSW at [phone] as needed to discuss social work needs of client.  Plan:  Client to communicate with CSW in next 30 days to discuss affordable housing options for client in the area. CSW to call client in 4 weeks to assess client needs at that time.  Kathryn [name].Kathryn [name] MSW, LCSW Licensed Clinical Social Worker [facility] Care  Management [phone]

## 2015-12-13 NOTE — Patient Outreach (Signed)
Kathryn [name] [facility]) Care Management  12/13/2015  [name] Mar 26, 1971 [id number]  Unsuccessful attempt to reach patient via phone to follow-up pharmacy needs.    Left a HIPAA compliant message requesting a call back.    Plan:  Will make another attempt to reach patient in the next week if no return call from patient.   Karrie Meres, PharmD, [place] [phone]

## 2015-12-14 ENCOUNTER — Encounter (HOSPITAL_COMMUNITY): Payer: Self-pay | Admitting: Psychiatry

## 2015-12-14 ENCOUNTER — Ambulatory Visit (INDEPENDENT_AMBULATORY_CARE_PROVIDER_SITE_OTHER): Payer: 59 | Admitting: Psychiatry

## 2015-12-14 VITALS — BP 162/106 | HR 83 | Ht 60.0 in | Wt 175.4 lb

## 2015-12-14 DIAGNOSIS — F251 Schizoaffective disorder, depressive type: Secondary | ICD-10-CM

## 2015-12-14 DIAGNOSIS — F4310 Post-traumatic stress disorder, unspecified: Secondary | ICD-10-CM | POA: Diagnosis not present

## 2015-12-14 MED ORDER — RISPERIDONE 2 MG PO TABS: 4.0000 mg | ORAL_TABLET | Freq: Every day | ORAL | Status: DC

## 2015-12-14 MED ORDER — DULOXETINE HCL 60 MG PO CPEP: 60.0000 mg | ORAL_CAPSULE | Freq: Every day | ORAL | Status: DC

## 2015-12-14 MED ORDER — TRAZODONE HCL 100 MG PO TABS: 100.0000 mg | ORAL_TABLET | Freq: Every day | ORAL | Status: DC

## 2015-12-14 MED ORDER — BENZTROPINE MESYLATE 1 MG PO TABS: 1.0000 mg | ORAL_TABLET | Freq: Every day | ORAL | Status: DC
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
## 2015-12-19 ENCOUNTER — Other Ambulatory Visit: Payer: Self-pay | Admitting: Pharmacist

## 2015-12-19 ENCOUNTER — Ambulatory Visit (HOSPITAL_COMMUNITY)
Admission: RE | Admit: 2015-12-19 | Discharge: 2015-12-19 | Disposition: A | Discharge status: Home / Self Care | Payer: Medicare Other | Source: Ambulatory Visit | Attending: Internal Medicine | Admitting: Internal Medicine

## 2015-12-19 DIAGNOSIS — Z1231 Encounter for screening mammogram for malignant neoplasm of breast: Secondary | ICD-10-CM

## 2015-12-19 NOTE — Patient Outreach (Signed)
[facility]) Care Management  [place]   12/19/2015  Kathryn [name] 1970/11/19 [id number]  Subjective:  Patient is a 45 year old female who was referred to [facility] for medication patient assistance.    Phone call was placed to patient on 12/19/15, she verified HIPAA details, to follow-up on further pharmacy needs.    Patient reports that she has been able to get her maintenance medications for 90 day supplies which she reports has helped with cost savings.    She also reports that her PCP stopped acarbose and she reports that her GI symptoms of flatulence have improved since PCP stopped the acarbose.    Patient denies further medication questions/concerns at this time.    Objective:   Current Medications: Current Outpatient Prescriptions  Medication Sig Dispense Refill  . albuterol (PROVENTIL HFA;VENTOLIN HFA) 108 (90 BASE) MCG/ACT inhaler Inhale 2 puffs into the lungs every 6 (six) hours as needed for wheezing. Reported on 10/03/2015    . aspirin EC 81 MG tablet Take 81 mg by mouth daily.    Marland Kitchen atorvastatin (LIPITOR) 10 MG tablet Take 1 tablet by mouth daily.    . benztropine (COGENTIN) 1 MG tablet Take 1 tablet (1 mg total) by mouth at bedtime. 30 tablet 2  . DULoxetine (CYMBALTA) 60 MG capsule Take 1 capsule (60 mg total) by mouth daily at 6 PM. 30 capsule 2  . gabapentin (NEURONTIN) 300 MG capsule Take 300 mg by mouth 2 (two) times daily.    Marland Kitchen HYDROcodone-acetaminophen (NORCO/VICODIN) 5-325 MG tablet Take 1 tablet by mouth every 6 (six) hours as needed. 10 tablet 0  . insulin lispro (HUMALOG) 100 UNIT/ML injection Inject into the skin 3 (three) times daily before meals.    Marland Kitchen levothyroxine (SYNTHROID, LEVOTHROID) 25 MCG tablet Take 25 mcg by mouth daily before breakfast.     . lisinopril (PRINIVIL,ZESTRIL) 40 MG tablet Take 40 mg by mouth daily.    . metFORMIN (GLUCOPHAGE) 1000 MG tablet Take 1,000 mg by mouth 2 (two) times daily.    .  methocarbamol (ROBAXIN) 500 MG tablet Take 500 mg by mouth every 6 (six) hours as needed for muscle spasms.    . Methylfol-Methylcob-Acetylcyst (CEREFOLIN NAC) 6-2-600 MG TABS Take by mouth once.    Marland Kitchen omeprazole (PRILOSEC) 20 MG capsule Take 20 mg by mouth at bedtime.     . ondansetron (ZOFRAN) 4 MG tablet Take 1 tablet (4 mg total) by mouth every 8 (eight) hours as needed for nausea or vomiting. 12 tablet 0  . pioglitazone (ACTOS) 15 MG tablet Take 15 mg by mouth daily.    . ranitidine (ZANTAC) 150 MG tablet Take 1 tablet (150 mg total) by mouth 2 (two) times daily. 28 tablet 0  . risperiDONE (RISPERDAL) 2 MG tablet Take 2 tablets (4 mg total) by mouth at bedtime. 60 tablet 2  . sucralfate (CARAFATE) 1 g tablet Take 1 tablet (1 g total) by mouth 4 (four) times daily -  with meals and at bedtime. 60 tablet 1  . TOUJEO SOLOSTAR 300 UNIT/ML SOPN Inject 80 Units into the skin at bedtime.     . traZODone (DESYREL) 100 MG tablet Take 1 tablet (100 mg total) by mouth at bedtime. 30 tablet 2  . Vitamin D, Ergocalciferol, (DRISDOL) 50000 units CAPS capsule Take 50,000 Units by mouth every 7 (seven) days.     No current facility-administered medications for this visit.     Functional Status: In your  present state of health, do you have any difficulty performing the following activities: 10/05/2015 10/05/2015  Hearing? N N  Vision? N N  Difficulty concentrating or making decisions? N N  Walking or climbing stairs? N N  Dressing or bathing? N N  Doing errands, shopping? N N  Preparing Food and eating ? N N  Using the Toilet? N N  In the past six months, have you accidently leaked urine? N N  Do you have problems with loss of bowel control? N N  Managing your Medications? N N  Managing your Finances? [name]  Housekeeping or managing your Housekeeping? N N  Some recent data might be hidden    Fall/Depression Screening: PHQ 2/9 Scores 12/13/2015 11/12/2015 10/18/2015 10/05/2015 10/05/2015 09/20/2015  PHQ -  2 Score '2 2 2 2 2 2  ' PHQ- 9 Score '10 10 10 10 10 10    ' Assessment:  Per patient report 90 day supplies of her maintenance medications written by her PCP has helped her with cost.     Patient is getting Partial Extra Help from [facility] which limits manufacturer patient assistance options.     Plan:  Given that goals of patient assistance have been met and patient denies further pharmacy questions/concerns, will close pharmacy case at this time.   Will in basket message [employer], Jerrye Noble, to make him aware of pharmacy case closure.  Patient confirmed that she has [facility] phone number should she have questions/concerns in the future.    Karrie Meres, PharmD, [place] [phone]

## 2016-01-02 ENCOUNTER — Other Ambulatory Visit: Payer: Self-pay

## 2016-01-02 NOTE — Patient Outreach (Addendum)
[facility] [facility]) Care Management  [place]  01/02/2016   Kathryn [name] December 17, 1970 [id number]  Subjective: Telephone call to patient for initial assessment.  Patients she is doing good and has a positive outlook. Patient reports that her sugars are in the 100's. Patient taking medications as ordered.  Patient states she has a primary care appointment August 21st.  Discussed with patient blood sugars and importance of diet and exercise in improving sugars.  She verbalized understanding.    Objective:   Encounter Medications:  Outpatient Encounter Prescriptions as of 01/02/2016  Medication Sig Note  . albuterol (PROVENTIL HFA;VENTOLIN HFA) 108 (90 BASE) MCG/ACT inhaler Inhale 2 puffs into the lungs every 6 (six) hours as needed for wheezing. Reported on 10/03/2015   . aspirin EC 81 MG tablet Take 81 mg by mouth daily.   Marland Kitchen atorvastatin (LIPITOR) 10 MG tablet Take 1 tablet by mouth daily.   . benztropine (COGENTIN) 1 MG tablet Take 1 tablet (1 mg total) by mouth at bedtime.   . DULoxetine (CYMBALTA) 60 MG capsule Take 1 capsule (60 mg total) by mouth daily at 6 PM.   . gabapentin (NEURONTIN) 300 MG capsule Take 300 mg by mouth 2 (two) times daily. 11/21/2015: Increased 1 tid Dr. Maudie Mercury  . HYDROcodone-acetaminophen (NORCO/VICODIN) 5-325 MG tablet Take 1 tablet by mouth every 6 (six) hours as needed.   . insulin lispro (HUMALOG) 100 UNIT/ML injection Inject into the skin 3 (three) times daily before meals. 10/03/2015: 15u 3x day  . levothyroxine (SYNTHROID, LEVOTHROID) 25 MCG tablet Take 25 mcg by mouth daily before breakfast.    . lisinopril (PRINIVIL,ZESTRIL) 40 MG tablet Take 40 mg by mouth daily.   . metFORMIN (GLUCOPHAGE) 1000 MG tablet Take 1,000 mg by mouth 2 (two) times daily. 11/21/2015: Needs prescription  . methocarbamol (ROBAXIN) 500 MG tablet Take 500 mg by mouth every 6 (six) hours as needed for muscle spasms.   . Methylfol-Methylcob-Acetylcyst (CEREFOLIN NAC) 6-2-600  MG TABS Take by mouth once.   Marland Kitchen omeprazole (PRILOSEC) 20 MG capsule Take 20 mg by mouth at bedtime.    . ondansetron (ZOFRAN) 4 MG tablet Take 1 tablet (4 mg total) by mouth every 8 (eight) hours as needed for nausea or vomiting.   . pioglitazone (ACTOS) 15 MG tablet Take 15 mg by mouth daily.   . ranitidine (ZANTAC) 150 MG tablet Take 1 tablet (150 mg total) by mouth 2 (two) times daily.   . risperiDONE (RISPERDAL) 2 MG tablet Take 2 tablets (4 mg total) by mouth at bedtime.   . sucralfate (CARAFATE) 1 g tablet Take 1 tablet (1 g total) by mouth 4 (four) times daily -  with meals and at bedtime.   [name] SOLOSTAR 300 UNIT/ML SOPN Inject 80 Units into the skin at bedtime.  10/03/2015: Increased to 76u qhs  . traZODone (DESYREL) 100 MG tablet Take 1 tablet (100 mg total) by mouth at bedtime.   . Vitamin D, Ergocalciferol, (DRISDOL) 50000 units CAPS capsule Take 50,000 Units by mouth every 7 (seven) days.    No facility-administered encounter medications on file as of 01/02/2016.     Functional Status:  In your present state of health, do you have any difficulty performing the following activities: 01/02/2016 10/05/2015  Hearing? N N  Vision? N N  Difficulty concentrating or making decisions? N N  Walking or climbing stairs? N N  Dressing or bathing? N N  Doing errands, shopping? N [employer]  and eating ? N N  Using the Toilet? N N  In the past six months, have you accidently leaked urine? N N  Do you have problems with loss of bowel control? N N  Managing your Medications? N N  Managing your Finances? N Y  Housekeeping or managing your Housekeeping? N N  Some recent data might be hidden    Fall/Depression Screening: PHQ 2/9 Scores 01/02/2016 12/13/2015 11/12/2015 10/18/2015 10/05/2015 10/05/2015 09/20/2015  PHQ - 2 Score 0 2 2 2 2 2 2   PHQ- 9 Score - 10 10 10 10 10 10     Assessment: Patient will benefit from health coach outreach for education and support of diabetes.    Plan:  [facility]  CM Care Plan Problem Two   Flowsheet Row Most Recent Value  Care Plan Problem Two  knowledge deficit related to diabetes as evidenced by questions around diabetes from patient  Role Documenting the Problem [place] for Problem Two  Active  Interventions for Problem Two Long Term Goal   RN Health Coach discussed with patient blood sugar readings, importance of diet and exercise in decreasing sugars.    THN Long Term Goal (31-90) days  Patient will have knowledge of diabetic diet over the next 90 days  THN Long Term Goal Start Date  01/02/16 [facility] continued]     [place] will provide ongoing education for patient on diabetes through phone calls and sending printed information to patient for further discussion.  RN Health Coach sent welcome letter.  RN Health Coach will send initial barriers letter, assessment, and care plan to primary care physician.  RN Health Coach will contact patient in the month of September and patient agrees to next contact.   Jone Baseman, RN, MSN [place] [phone]

## 2016-01-11 ENCOUNTER — Ambulatory Visit (INDEPENDENT_AMBULATORY_CARE_PROVIDER_SITE_OTHER): Payer: 59 | Admitting: Psychiatry

## 2016-01-11 ENCOUNTER — Encounter (HOSPITAL_COMMUNITY): Payer: Self-pay | Admitting: Psychiatry

## 2016-01-11 VITALS — BP 128/95 | HR 105 | Ht 60.0 in | Wt 175.4 lb

## 2016-01-11 DIAGNOSIS — F4310 Post-traumatic stress disorder, unspecified: Secondary | ICD-10-CM | POA: Diagnosis not present

## 2016-01-11 DIAGNOSIS — F251 Schizoaffective disorder, depressive type: Secondary | ICD-10-CM

## 2016-01-11 MED ORDER — RISPERIDONE 2 MG PO TABS: 4.0000 mg | ORAL_TABLET | Freq: Every day | ORAL | 2 refills | Status: DC

## 2016-01-11 MED ORDER — BENZTROPINE MESYLATE 1 MG PO TABS: 1.0000 mg | ORAL_TABLET | Freq: Every day | ORAL | 2 refills | Status: DC

## 2016-01-11 MED ORDER — TRAZODONE HCL 100 MG PO TABS: 100.0000 mg | ORAL_TABLET | Freq: Every day | ORAL | 2 refills | Status: DC

## 2016-01-11 MED ORDER — DULOXETINE HCL 60 MG PO CPEP: 60.0000 mg | ORAL_CAPSULE | Freq: Every day | ORAL | 2 refills | Status: DC
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
## 2016-01-15 ENCOUNTER — Other Ambulatory Visit: Payer: Self-pay | Admitting: Licensed Clinical Social Worker

## 2016-01-15 NOTE — Patient Outreach (Signed)
Assessment:  CSW spoke via phone with client on 01/15/16. CSW verified client identity. CSW and client spoke of client needs. Client sees Dr. [name] as her primary care doctor.  Client said she had an appointment with Dr. Maudie Mercury on 01/14/16.  She also sees Dr. Harrington Challenger, psychiatrist, for mental health needs of client.  Client said that appointments with Dr. Harrington Challenger had been very helpful to client. Client said she has her prescribed medications and is taking medications as prescribed. Client had also been communicating with [facility] pharmacist, Isabell Jarvis, about medication needs of client. [name] recently closed [place] support for client since client had her prescribed medications and had no further pharmacy issues at this time. [place], RN is currently also providing support to client. CSW had previously informed client about [facility] and encouraged [name] to contact that agency to inquire about possible food support for client.  CSW spoke with client about client care plan. CSW encouraged client to communicate with CSW in next 30 days to discuss affordable housing options for client in the area.  CSW had previously provided client with a list of possible housing options for client in the [place] area and in the [place], [place] area.   Client said that her spouse transports client to and from client's scheduled medical and mental health appointments.  Client said she is sleeping well and eating well. She said she is still looking at housing options in the area.  She said that her medications are now more affordable and she obtains some of her prescribed medications in a 90 day supply.  Client said she will see Dr. Harrington Challenger again in two months for an appointment. CSW spoke with client about ways client manages anxiety or stress. Client said she and her family like to go out occasionally to share a meal together. She said that she and her family have game nights often  where they enjoy playing board games and enjoy spending time with each other. CSW encouraged client to continue to use relaxation techniques to help client manage anxiety or stress faced by client.  Client said that her visits with Dr. Harrington Challenger were helpful and that medication changes for client by Dr. Harrington Challenger had been very helpful to client. CSW encouraged client to call CSW at [phone] as needed for social work support.   Plan:  Client to communicate with CSW in next 30 days to discuss affordable housing options for client in the area.  CSW to call client in 4 weeks to assess client needs at that time.  Norva Riffle.Nicklous Aburto MSW, LCSW Licensed Clinical Social Worker [facility] Management [phone]

## 2016-01-26 ENCOUNTER — Encounter (HOSPITAL_COMMUNITY): Payer: Self-pay | Admitting: Emergency Medicine

## 2016-01-26 ENCOUNTER — Emergency Department (HOSPITAL_COMMUNITY)
Admission: EM | Admit: 2016-01-26 | Discharge: 2016-01-26 | Disposition: A | Discharge status: Home / Self Care | Payer: No Typology Code available for payment source | Attending: Emergency Medicine | Admitting: Emergency Medicine

## 2016-01-26 DIAGNOSIS — Y92410 Unspecified street and highway as the place of occurrence of the external cause: Secondary | ICD-10-CM | POA: Diagnosis not present

## 2016-01-26 DIAGNOSIS — Y999 Unspecified external cause status: Secondary | ICD-10-CM | POA: Diagnosis not present

## 2016-01-26 DIAGNOSIS — Z79899 Other long term (current) drug therapy: Secondary | ICD-10-CM | POA: Insufficient documentation

## 2016-01-26 DIAGNOSIS — Z7982 Long term (current) use of aspirin: Secondary | ICD-10-CM | POA: Diagnosis not present

## 2016-01-26 DIAGNOSIS — I10 Essential (primary) hypertension: Secondary | ICD-10-CM | POA: Diagnosis not present

## 2016-01-26 DIAGNOSIS — Z794 Long term (current) use of insulin: Secondary | ICD-10-CM | POA: Diagnosis not present

## 2016-01-26 DIAGNOSIS — Y939 Activity, unspecified: Secondary | ICD-10-CM | POA: Diagnosis not present

## 2016-01-26 DIAGNOSIS — S39012A Strain of muscle, fascia and tendon of lower back, initial encounter: Secondary | ICD-10-CM | POA: Diagnosis not present

## 2016-01-26 DIAGNOSIS — E119 Type 2 diabetes mellitus without complications: Secondary | ICD-10-CM | POA: Diagnosis not present

## 2016-01-26 DIAGNOSIS — S3992XA Unspecified injury of lower back, initial encounter: Secondary | ICD-10-CM | POA: Diagnosis present

## 2016-01-26 MED ORDER — CYCLOBENZAPRINE HCL 10 MG PO TABS: 10.0000 mg | ORAL_TABLET | Freq: Three times a day (TID) | ORAL | 0 refills | Status: DC | PRN

## 2016-01-26 MED ORDER — TRAMADOL HCL 50 MG PO TABS: 50.0000 mg | ORAL_TABLET | Freq: Four times a day (QID) | ORAL | 0 refills | Status: DC | PRN

## 2016-01-26 NOTE — Discharge Instructions (Signed)
Apply ice packs on/off.  Follow-up with your doctor or return here for any worsening symptoms
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
## 2016-02-05 ENCOUNTER — Other Ambulatory Visit: Payer: Self-pay

## 2016-02-05 NOTE — Patient Outreach (Signed)
[facility]) Care Management  [place]  02/05/2016   Kathryn [name] [birth year] [id number]  Subjective: Telephone call to patient for monthly call.  Patient reports she is doing good. Patient reports recently changing her prescriptions to [place].  She is pleased with it. Patient reports she continues to see mental health and is doing good.  Patient reports blood sugar today was 155.  Patient able to discuss meal planning and what types of snacks to eat. Lengthy conversation with patient about types of meals and snacks to consume in order to keep blood sugar under control. She verbalized understanding and states she wants to not have complications of diabetes like her father's side of the family so she is trying hard to control her diet.   Objective:   Encounter Medications:  Outpatient Encounter Prescriptions as of 02/05/2016  Medication Sig Note  . albuterol (PROVENTIL HFA;VENTOLIN HFA) 108 (90 BASE) MCG/ACT inhaler Inhale 2 puffs into the lungs every 6 (six) hours as needed for wheezing. Reported on 10/03/2015   . aspirin EC 81 MG tablet Take 81 mg by mouth daily.   Marland Kitchen atorvastatin (LIPITOR) 10 MG tablet Take 1 tablet by mouth daily.   . benztropine (COGENTIN) 1 MG tablet Take 1 tablet (1 mg total) by mouth at bedtime.   . cyclobenzaprine (FLEXERIL) 10 MG tablet Take 1 tablet (10 mg total) by mouth 3 (three) times daily as needed.   . diclofenac (VOLTAREN) 75 MG EC tablet Take 75 mg by mouth 2 (two) times daily. 01/26/2016: Received from: External Pharmacy  . DULoxetine (CYMBALTA) 60 MG capsule Take 1 capsule (60 mg total) by mouth daily at 6 PM.   . gabapentin (NEURONTIN) 300 MG capsule Take 300 mg by mouth 3 (three) times daily.    . insulin lispro (HUMALOG) 100 UNIT/ML injection Inject 15 Units into the skin 3 (three) times daily before meals.    Marland Kitchen levothyroxine (SYNTHROID, LEVOTHROID) 25 MCG tablet Take 25 mcg by mouth daily before breakfast.    .  lisinopril (PRINIVIL,ZESTRIL) 40 MG tablet Take 40 mg by mouth daily.   Marland Kitchen omeprazole (PRILOSEC) 20 MG capsule Take 20 mg by mouth at bedtime.    . pioglitazone (ACTOS) 15 MG tablet Take 15 mg by mouth daily.   . risperiDONE (RISPERDAL) 2 MG tablet Take 2 tablets (4 mg total) by mouth at bedtime.   . sucralfate (CARAFATE) 1 g tablet Take 1 tablet (1 g total) by mouth 4 (four) times daily -  with meals and at bedtime.   [name] SOLOSTAR 300 UNIT/ML SOPN Inject 20-70 Units into the skin at bedtime. 70 units in the morning and 20 units at bedtime 10/03/2015: Increased to 76u qhs  . traMADol (ULTRAM) 50 MG tablet Take 1 tablet (50 mg total) by mouth every 6 (six) hours as needed.   . traZODone (DESYREL) 100 MG tablet Take 1 tablet (100 mg total) by mouth at bedtime.   . Vitamin D, Ergocalciferol, (DRISDOL) 50000 units CAPS capsule Take 50,000 Units by mouth every Sunday.    Marland Kitchen HYDROcodone-acetaminophen (NORCO/VICODIN) 5-325 MG tablet Take 1 tablet by mouth every 6 (six) hours as needed. (Patient not taking: Reported on 01/26/2016)   . ondansetron (ZOFRAN) 4 MG tablet Take 1 tablet (4 mg total) by mouth every 8 (eight) hours as needed for nausea or vomiting. (Patient not taking: Reported on 01/26/2016)   . ranitidine (ZANTAC) 150 MG tablet Take 1 tablet (150 mg total) by mouth  2 (two) times daily. (Patient not taking: Reported on 01/11/2016)    No facility-administered encounter medications on file as of 02/05/2016.     Functional Status:  In your present state of health, do you have any difficulty performing the following activities: 01/02/2016 10/05/2015  Hearing? N N  Vision? N N  Difficulty concentrating or making decisions? N N  Walking or climbing stairs? N N  Dressing or bathing? N N  Doing errands, shopping? N N  Preparing Food and eating ? N N  Using the Toilet? N N  In the past six months, have you accidently leaked urine? N N  Do you have problems with loss of bowel control? N N  Managing your  Medications? N N  Managing your Finances? N Y  Housekeeping or managing your Housekeeping? N N  Some recent data might be hidden    Fall/Depression Screening: PHQ 2/9 Scores 02/05/2016 01/02/2016 12/13/2015 11/12/2015 10/18/2015 10/05/2015 10/05/2015  PHQ - 2 Score 0 0 2 2 2 2 2   PHQ- 9 Score - - 10 10 10 10 10     Assessment: Patient continues to benefit from health coach outreach for disease management and support.    Plan:  [facility] CM Care Plan Problem Two   Flowsheet Row Most Recent Value  Care Plan Problem Two  knowledge deficit related to diabetes as evidenced by questions around diabetes from patient  Role Documenting the Problem Two  [place] for Problem Two  Active  Interventions for Problem Two Long Term Goal   RN Health Coach reviewed with patient blood sugar readings, importance of diet, meal planning, and exercise in decreasing sugars.    THN Long Term Goal (31-90) days  Patient will have knowledge of diabetic diet over the next 90 days  THN Long Term Goal Start Date  01/02/16 [facility] continued]      [place] will contact patient in the month of October and patient agrees to next outreach.  Jone Baseman, RN, MSN [place] [phone]

## 2016-02-15 ENCOUNTER — Other Ambulatory Visit: Payer: Self-pay | Admitting: Licensed Clinical Social Worker

## 2016-02-15 NOTE — Patient Outreach (Signed)
Assessment"  CSW spoke via phone with client on 02/15/16. CSW verified client identity. CSW and client spoke of client needs.  Client said she had her prescribed medications and is taking medications as prescribed. She sees Dr. Maudie Mercury as her primary care doctor. Client has next appointment with Dr. Maudie Mercury on 04/15/16.Client is receiving support from [place], [name], RN.  CSW had previously spoken with client about [facility] and encouraged her to contact that agency for possible food support assistance for client. CSW and client spoke of client care plan. CSW encouraged client to communicate with CSW in next 30 days to discuss affordable housing options for client in the area. Client said that her spouse transports her to and from her scheduled medical and mental health appointments. Client sees Dr. Harrington Challenger, psychiatrist, as scheduled for mental health support.  Client said that her medications are now more affordable.  CSW spoke with [name] about anxiety and stress management techniques for client. Client said that she and her family like to go out occasionally to share a meal together. Client said she and family have weekly game nights and that this helps her to relax and reduce stress.  CSW encouraged [name] to continue to use relaxation techniques to help her manage anxiety and stress symptoms. CSW encouraged [name] to call CSW at [phone] as needed to discuss social work needs of client.  Client has appointment with Dr. Harrington Challenger scheduled for coming month.  Client said that she appreciated also help she was receiving from [place], [name] RN. [place] thanked client for phone call with CSW on 02/15/16.  Plan:  Client to communicate with CSW in next 30 days to discuss affordable housing options for client in the area.  CSW to call client in 4 weeks to assess client needs.  Norva Riffle.Olusegun Gerstenberger MSW, LCSW Licensed Clinical Social Worker [facility] Care Management [phone]

## 2016-02-29 ENCOUNTER — Other Ambulatory Visit: Payer: Self-pay

## 2016-02-29 NOTE — Patient Outreach (Signed)
[facility] [facility]) Care Management  [place]  02/29/2016   Kathryn [name] [birth year] [id number]  Subjective: Telephone call to patient for monthly call. Patient reports she is doing much better.  She reports she is managing her depression better by having a routine daily. Patient also reports that she is in school which helps too.  Patient reports that her blood sugars are in the 100's and her doctor is pleased.  Today's reading was 154.  Discussed with patient the importance of continuing diet in order to keep blood sugars lower. She verbalized understanding.   Objective:   Encounter Medications:  Outpatient Encounter Prescriptions as of 02/29/2016  Medication Sig Note  . albuterol (PROVENTIL HFA;VENTOLIN HFA) 108 (90 BASE) MCG/ACT inhaler Inhale 2 puffs into the lungs every 6 (six) hours as needed for wheezing. Reported on 10/03/2015   . aspirin EC 81 MG tablet Take 81 mg by mouth daily.   Marland Kitchen atorvastatin (LIPITOR) 10 MG tablet Take 1 tablet by mouth daily.   . benztropine (COGENTIN) 1 MG tablet Take 1 tablet (1 mg total) by mouth at bedtime.   . cyclobenzaprine (FLEXERIL) 10 MG tablet Take 1 tablet (10 mg total) by mouth 3 (three) times daily as needed.   . diclofenac (VOLTAREN) 75 MG EC tablet Take 75 mg by mouth 2 (two) times daily. 01/26/2016: Received from: External Pharmacy  . DULoxetine (CYMBALTA) 60 MG capsule Take 1 capsule (60 mg total) by mouth daily at 6 PM.   . gabapentin (NEURONTIN) 300 MG capsule Take 300 mg by mouth 3 (three) times daily.    . insulin lispro (HUMALOG) 100 UNIT/ML injection Inject 15 Units into the skin 3 (three) times daily before meals.    Marland Kitchen levothyroxine (SYNTHROID, LEVOTHROID) 25 MCG tablet Take 25 mcg by mouth daily before breakfast.    . lisinopril (PRINIVIL,ZESTRIL) 40 MG tablet Take 40 mg by mouth daily.   Marland Kitchen omeprazole (PRILOSEC) 20 MG capsule Take 20 mg by mouth at bedtime.    . pioglitazone (ACTOS) 15 MG tablet Take 15 mg by mouth  daily.   . risperiDONE (RISPERDAL) 2 MG tablet Take 2 tablets (4 mg total) by mouth at bedtime.   . sucralfate (CARAFATE) 1 g tablet Take 1 tablet (1 g total) by mouth 4 (four) times daily -  with meals and at bedtime.   [name] SOLOSTAR 300 UNIT/ML SOPN Inject 20-70 Units into the skin at bedtime. 70 units in the morning and 20 units at bedtime 10/03/2015: Increased to 76u qhs  . traMADol (ULTRAM) 50 MG tablet Take 1 tablet (50 mg total) by mouth every 6 (six) hours as needed.   . traZODone (DESYREL) 100 MG tablet Take 1 tablet (100 mg total) by mouth at bedtime.   . Vitamin D, Ergocalciferol, (DRISDOL) 50000 units CAPS capsule Take 50,000 Units by mouth every Sunday.    Marland Kitchen HYDROcodone-acetaminophen (NORCO/VICODIN) 5-325 MG tablet Take 1 tablet by mouth every 6 (six) hours as needed. (Patient not taking: Reported on 02/29/2016)   . ondansetron (ZOFRAN) 4 MG tablet Take 1 tablet (4 mg total) by mouth every 8 (eight) hours as needed for nausea or vomiting. (Patient not taking: Reported on 02/29/2016)   . ranitidine (ZANTAC) 150 MG tablet Take 1 tablet (150 mg total) by mouth 2 (two) times daily. (Patient not taking: Reported on 02/29/2016)    No facility-administered encounter medications on file as of 02/29/2016.     Functional Status:  In your present state of  health, do you have any difficulty performing the following activities: 01/02/2016 10/05/2015  Hearing? N N  Vision? N N  Difficulty concentrating or making decisions? N N  Walking or climbing stairs? N N  Dressing or bathing? N N  Doing errands, shopping? N N  Preparing Food and eating ? N N  Using the Toilet? N N  In the past six months, have you accidently leaked urine? N N  Do you have problems with loss of bowel control? N N  Managing your Medications? N N  Managing your Finances? N Y  Housekeeping or managing your Housekeeping? N N  Some recent data might be hidden    Fall/Depression Screening: PHQ 2/9 Scores 02/29/2016 02/05/2016  01/02/2016 12/13/2015 11/12/2015 10/18/2015 10/05/2015  PHQ - 2 Score 0 0 0 2 2 2 2   PHQ- 9 Score - - - 10 10 10 10     Assessment: Patient continues to benefit from health coach outreach for disease management and support.    Plan:  [facility] CM Care Plan Problem Two   Flowsheet Row Most Recent Value  Care Plan Problem Two  knowledge deficit related to diabetes as evidenced by questions around diabetes from patient  Role Documenting the Problem [place] for Problem Two  Active  Interventions for Problem Two Long Term Goal   RN Health Coach reinfroced with patient blood sugar readings, importance of diet, meal planning, and exercise in decreasing sugars.    THN Long Term Goal (31-90) days  Patient will have knowledge of diabetic diet over the next 90 days  THN Long Term Goal Start Date  02/29/16 [facility] continued]     [place] will contact patient in the month of November and patient agrees to next outreach.  Jone Baseman, RN, MSN [place] [phone]

## 2016-03-12 ENCOUNTER — Encounter (HOSPITAL_COMMUNITY): Payer: Self-pay | Admitting: Psychiatry

## 2016-03-12 ENCOUNTER — Ambulatory Visit (HOSPITAL_COMMUNITY): Payer: Self-pay | Admitting: Psychiatry

## 2016-03-12 ENCOUNTER — Ambulatory Visit (INDEPENDENT_AMBULATORY_CARE_PROVIDER_SITE_OTHER): Payer: 59 | Admitting: Psychiatry

## 2016-03-12 VITALS — BP 153/100 | HR 84 | Ht 60.0 in | Wt 186.2 lb

## 2016-03-12 DIAGNOSIS — F251 Schizoaffective disorder, depressive type: Secondary | ICD-10-CM

## 2016-03-12 DIAGNOSIS — Z823 Family history of stroke: Secondary | ICD-10-CM

## 2016-03-12 DIAGNOSIS — Z811 Family history of alcohol abuse and dependence: Secondary | ICD-10-CM

## 2016-03-12 DIAGNOSIS — F4310 Post-traumatic stress disorder, unspecified: Secondary | ICD-10-CM

## 2016-03-12 DIAGNOSIS — Z8249 Family history of ischemic heart disease and other diseases of the circulatory system: Secondary | ICD-10-CM | POA: Diagnosis not present

## 2016-03-12 DIAGNOSIS — Z808 Family history of malignant neoplasm of other organs or systems: Secondary | ICD-10-CM

## 2016-03-12 DIAGNOSIS — Z833 Family history of diabetes mellitus: Secondary | ICD-10-CM

## 2016-03-12 MED ORDER — BENZTROPINE MESYLATE 1 MG PO TABS: 1.0000 mg | ORAL_TABLET | Freq: Every day | ORAL | 2 refills | Status: DC

## 2016-03-12 MED ORDER — DULOXETINE HCL 60 MG PO CPEP: 60.0000 mg | ORAL_CAPSULE | Freq: Every day | ORAL | 2 refills | Status: DC

## 2016-03-12 MED ORDER — RISPERIDONE 2 MG PO TABS: 4.0000 mg | ORAL_TABLET | Freq: Every day | ORAL | 2 refills | Status: DC

## 2016-03-12 MED ORDER — TRAZODONE HCL 150 MG PO TABS: 150.0000 mg | ORAL_TABLET | Freq: Every day | ORAL | 2 refills | Status: DC
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
## 2016-03-19 ENCOUNTER — Other Ambulatory Visit: Payer: Self-pay | Admitting: Licensed Clinical Social Worker

## 2016-03-19 NOTE — Patient Outreach (Signed)
Assessment:  CSW spoke via phone with client on 03/19/16.  CSW verified client identity. CSW and client spoke of client needs. Client said she had her prescribed medications and was taking medications as prescribed. Client sees Dr. Maudie Mercury as primary care doctor.  Client has appointment scheduled with Dr. Maudie Mercury for 04/15/16. Client has support of [place], [name] Leath. RN.  Client said that her spouse transports her to and from her medical and mental health appointments. Client sees Dr. Harrington Challenger, psychiatrist for mental health support. CSW and client spoke of client care plan. CSW encouraged client to commuincate with CSW in next 30 days to discuss affordable housing options for client in the area. Client said she is still researching housing options in [place], [place].   [place] encouraged client to use relaxation techniques to help her manage stress and anxiety.  Client enjoys having board game nights at home with her family, enjoys TV, and enjoys going out to eat with her relatives. She said she is receiving her prescribed medications at affordable rates.  She said she receives her prescribed medications through [facility].  She said she is eating adequately.  She said she had an appointment with Dr. Harrington Challenger on 03/11/16. Client said she will have next appointment with Dr. Harrington Challenger on 06/13/15.  CSW talked with client about [facility] as a resource for food support for client. Client and CSW spoke of application process at that agency to seek food assistance. CSW encouraged client to call CSW at [phone] as needed to discuss social work needs of client.  CSW thanked client for phone conversation with CSW on 03/19/16.  Plan:  Client to communicate with CSW in next 30 days to discuss affordable housing options for client in the community.  CSW to call client in 4 weeks to assess client needs.  Norva Riffle.Aurelius Gildersleeve MSW, LCSW Licensed Clinical Social Worker [facility] Care Management [phone]

## 2016-03-31 ENCOUNTER — Other Ambulatory Visit: Payer: Self-pay

## 2016-03-31 NOTE — Patient Outreach (Signed)
[facility]) Care Management  [place]  03/31/2016   Kathryn [name] May 14, 1971 [id number]  Subjective: Telephone call to patient for monthly call. Patient reports she is doing ok.  She reports that Dr. Harrington Challenger increased her trazodone to help with her sleeping at night.  She reports that it has worked. She reports controlling her blood sugar and trying different seasonings to season her food.  Her blood sugar today was 153.  Reiterated to patient importance of diet in order to control sugars.  She verbalized understanding.  No concerns.    Objective:   Encounter Medications:  Outpatient Encounter Prescriptions as of 03/31/2016  Medication Sig Note  . albuterol (PROVENTIL HFA;VENTOLIN HFA) 108 (90 BASE) MCG/ACT inhaler Inhale 2 puffs into the lungs every 6 (six) hours as needed for wheezing. Reported on 10/03/2015   . aspirin EC 81 MG tablet Take 81 mg by mouth daily.   Marland Kitchen atorvastatin (LIPITOR) 10 MG tablet Take 1 tablet by mouth daily.   . benztropine (COGENTIN) 1 MG tablet Take 1 tablet (1 mg total) by mouth at bedtime.   . cyclobenzaprine (FLEXERIL) 10 MG tablet Take 1 tablet (10 mg total) by mouth 3 (three) times daily as needed.   . diclofenac (VOLTAREN) 75 MG EC tablet Take 75 mg by mouth 2 (two) times daily. 01/26/2016: Received from: External Pharmacy  . DULoxetine (CYMBALTA) 60 MG capsule Take 1 capsule (60 mg total) by mouth daily at 6 PM.   . gabapentin (NEURONTIN) 300 MG capsule Take 300 mg by mouth 3 (three) times daily.    Marland Kitchen HYDROcodone-acetaminophen (NORCO/VICODIN) 5-325 MG tablet Take 1 tablet by mouth every 6 (six) hours as needed.   . insulin lispro (HUMALOG) 100 UNIT/ML injection Inject 15 Units into the skin 3 (three) times daily before meals.    Marland Kitchen levothyroxine (SYNTHROID, LEVOTHROID) 25 MCG tablet Take 25 mcg by mouth daily before breakfast.    . lisinopril (PRINIVIL,ZESTRIL) 40 MG tablet Take 40 mg by mouth daily.   Marland Kitchen omeprazole (PRILOSEC) 20 MG  capsule Take 20 mg by mouth at bedtime.    . ondansetron (ZOFRAN) 4 MG tablet Take 1 tablet (4 mg total) by mouth every 8 (eight) hours as needed for nausea or vomiting.   . pioglitazone (ACTOS) 15 MG tablet Take 15 mg by mouth daily.   . ranitidine (ZANTAC) 150 MG tablet Take 1 tablet (150 mg total) by mouth 2 (two) times daily.   . risperiDONE (RISPERDAL) 2 MG tablet Take 2 tablets (4 mg total) by mouth at bedtime.   . sucralfate (CARAFATE) 1 g tablet Take 1 tablet (1 g total) by mouth 4 (four) times daily -  with meals and at bedtime.   [name] SOLOSTAR 300 UNIT/ML SOPN Inject 20-70 Units into the skin at bedtime. 70 units in the morning and 20 units at bedtime 10/03/2015: Increased to 76u qhs  . traMADol (ULTRAM) 50 MG tablet Take 1 tablet (50 mg total) by mouth every 6 (six) hours as needed.   . traZODone (DESYREL) 150 MG tablet Take 1 tablet (150 mg total) by mouth at bedtime.   . Vitamin D, Ergocalciferol, (DRISDOL) 50000 units CAPS capsule Take 50,000 Units by mouth every Sunday.     No facility-administered encounter medications on file as of 03/31/2016.     Functional Status:  In your present state of health, do you have any difficulty performing the following activities: 01/02/2016 10/05/2015  Hearing? N N  Vision? N N  Difficulty concentrating or making decisions? N N  Walking or climbing stairs? N N  Dressing or bathing? N N  Doing errands, shopping? N N  Preparing Food and eating ? N N  Using the Toilet? N N  In the past six months, have you accidently leaked urine? N N  Do you have problems with loss of bowel control? N N  Managing your Medications? N N  Managing your Finances? N Y  Housekeeping or managing your Housekeeping? N N  Some recent data might be hidden    Fall/Depression Screening: PHQ 2/9 Scores 03/31/2016 02/29/2016 02/05/2016 01/02/2016 12/13/2015 11/12/2015 10/18/2015  PHQ - 2 Score 0 0 0 0 2 2 2   PHQ- 9 Score - - - - 10 10 10     Assessment: Patient continues  to benefit from health coach outreach for disease management and support.    Plan:  [facility] CM Care Plan Problem Two   Flowsheet Row Most Recent Value  Care Plan Problem Two  knowledge deficit related to diabetes as evidenced by questions around diabetes from patient  Role Documenting the Problem Two  [place] for Problem Two  Active  Interventions for Problem Two Long Term Goal   RN Health Coach reiterated with patient blood sugar readings, importance of diet, meal planning, and exercise in decreasing sugars.    THN Long Term Goal (31-90) days  Patient will have knowledge of diabetic diet over the next 90 days  THN Long Term Goal Start Date  03/31/16 [facility] continued]      [place] will contact patient in the month of December and patient agrees to next outreach.  Jone Baseman, RN, MSN [place] [phone]

## 2016-04-08 ENCOUNTER — Ambulatory Visit (INDEPENDENT_AMBULATORY_CARE_PROVIDER_SITE_OTHER): Payer: Medicare Other | Admitting: Internal Medicine

## 2016-04-08 ENCOUNTER — Encounter (INDEPENDENT_AMBULATORY_CARE_PROVIDER_SITE_OTHER): Payer: Self-pay | Admitting: Internal Medicine

## 2016-04-08 VITALS — BP 122/80 | HR 78 | Temp 98.3°F | Resp 18 | Ht 60.0 in | Wt 170.0 lb

## 2016-04-08 DIAGNOSIS — K5900 Constipation, unspecified: Secondary | ICD-10-CM

## 2016-04-08 DIAGNOSIS — K219 Gastro-esophageal reflux disease without esophagitis: Secondary | ICD-10-CM

## 2016-04-08 MED ORDER — RANITIDINE HCL 150 MG PO TABS: 150.0000 mg | ORAL_TABLET | Freq: Every evening | ORAL | 5 refills | Status: DC | PRN

## 2016-04-08 MED ORDER — LINACLOTIDE 290 MCG PO CAPS: 290.0000 ug | ORAL_CAPSULE | Freq: Every day | ORAL | 5 refills | Status: DC

## 2016-04-08 NOTE — Progress Notes (Signed)
Presenting complaint;  Follow-up for GERD and constipation.  Database and Subjective:  Patient is 45 year old African-American female who was chronic GERD. She had EGD in March 2017 revealing normal esophagus and regular Z line and granular antral mucosa. Biopsy revealed chronic gastritis but no evidence of H. Pylori. She was hospitalized briefly in April 2017 for lithium toxicity and noted to have elevated transaminases. Workup was negative and etiology was felt to be injury due to lithium. She had one starting which was temporarily held. LFTs returned to normal when she was last seen on 10/08/2015. She is back on atorvastatin and not having any problems.  She is his heartburn is well controlled with therapy. She takes omeprazole as well as Zantac at bedtime. She denies dysphagia nausea or vomiting. She remains with constipation. She states Linzess is not working. She generally has 3 bowel movements per week and each time she passes hard stool and straining. She denies melena or rectal bleeding. She has taken Ex-Lax on as-needed basis. She says she is trying to eat more broccoli and greens. Current weight has been stable since her last visit. She is on gabapentin for peripheral neuropathy primarily involving the right foot. She October and intact on as-needed basis but not twice daily. She is not having any side effects with this medication.    Current Medications: Outpatient Encounter Prescriptions as of 04/08/2016  Medication Sig  . albuterol (PROVENTIL HFA;VENTOLIN HFA) 108 (90 BASE) MCG/ACT inhaler Inhale 2 puffs into the lungs every 6 (six) hours as needed for wheezing. Reported on 10/03/2015  . aspirin EC 81 MG tablet Take 81 mg by mouth daily.  Marland Kitchen atorvastatin (LIPITOR) 10 MG tablet Take 1 tablet by mouth daily.  . benztropine (COGENTIN) 1 MG tablet Take 1 tablet (1 mg total) by mouth at bedtime.  . cyclobenzaprine (FLEXERIL) 10 MG tablet Take 1 tablet (10 mg total) by mouth 3 (three)  times daily as needed.  . diclofenac (VOLTAREN) 75 MG EC tablet Take 75 mg by mouth 2 (two) times daily.  . DULoxetine (CYMBALTA) 60 MG capsule Take 1 capsule (60 mg total) by mouth daily at 6 PM.  . gabapentin (NEURONTIN) 300 MG capsule Take 300 mg by mouth 3 (three) times daily.   Marland Kitchen HYDROcodone-acetaminophen (NORCO/VICODIN) 5-325 MG tablet Take 1 tablet by mouth every 6 (six) hours as needed.  . insulin lispro (HUMALOG) 100 UNIT/ML injection Inject 15 Units into the skin 3 (three) times daily before meals.   Marland Kitchen levothyroxine (SYNTHROID, LEVOTHROID) 25 MCG tablet Take 25 mcg by mouth daily before breakfast.   . linaclotide (LINZESS) 145 MCG CAPS capsule Take 145 mcg by mouth daily before breakfast.  . lisinopril (PRINIVIL,ZESTRIL) 40 MG tablet Take 40 mg by mouth daily.  Marland Kitchen omeprazole (PRILOSEC) 20 MG capsule Take 20 mg by mouth at bedtime.   . ondansetron (ZOFRAN) 4 MG tablet Take 1 tablet (4 mg total) by mouth every 8 (eight) hours as needed for nausea or vomiting.  . pioglitazone (ACTOS) 15 MG tablet Take 15 mg by mouth daily.  . ranitidine (ZANTAC) 150 MG tablet Take 1 tablet (150 mg total) by mouth 2 (two) times daily.  . risperiDONE (RISPERDAL) 2 MG tablet Take 2 tablets (4 mg total) by mouth at bedtime.  [name] SOLOSTAR 300 UNIT/ML SOPN Inject 20-70 Units into the skin at bedtime. 70 units in the morning and 20 units at bedtime  . traMADol (ULTRAM) 50 MG tablet Take 1 tablet (50 mg total) by mouth every  6 (six) hours as needed.  . traZODone (DESYREL) 150 MG tablet Take 1 tablet (150 mg total) by mouth at bedtime.  . Vitamin D, Ergocalciferol, (DRISDOL) 50000 units CAPS capsule Take 50,000 Units by mouth every Sunday.   . [DISCONTINUED] sucralfate (CARAFATE) 1 g tablet Take 1 tablet (1 g total) by mouth 4 (four) times daily -  with meals and at bedtime.  . [DISCONTINUED] traZODone (DESYREL) 100 MG tablet    No facility-administered encounter medications on file as of 04/08/2016.       Objective: Blood pressure 122/80, pulse 78, temperature 98.3 F (36.8 C), temperature source Oral, resp. rate 18, height 5' (1.524 m), weight 170 lb (77.1 kg), last menstrual period 01/23/2013. Patient is alert and in no acute distress. Conjunctiva is pink. Sclera is nonicteric Oropharyngeal mucosa is normal. No neck masses or thyromegaly noted. Cardiac exam with regular rhythm normal S1 and S2. No murmur or gallop noted. Lungs are clear to auscultation. Abdomen is full but soft with mild midepigastric tenderness. No organomegaly or masses noted. No LE edema or clubbing noted.  Labs/studies Results: Lab data from 10/29/2015  Bilirubin 0.7, AP 55, AST 14, ALT 14 and albumin 3.7.  Serum calcium 8.9.   Assessment:  #1. Chronic GERD. She is not taking medications correctly. Symptoms nevertheless are well controlled. #2. Chronic constipation. She is not responding to therapy and may benefit from higher dose of Linzess. #3. History of elevated transaminases secondary to lithium toxicity back in April 2017. She is not on lithium anymore. She is back on atorvastatin. Transaminases were normal in May and June this year.   Plan:  Increase Linzess to 290 mcg by mouth daily before breakfast. Take omeprazole 20 mg by mouth 30 minutes before evening meal. Use ranitidine 150 mg by mouth daily at bedtime when necessary. Patient will call with progress report in one month. Office visit in 6 months.

## 2016-04-08 NOTE — Patient Instructions (Signed)
Remember to take omeprazole 30-60 minutes before evening meal daily. Ranitidine 150 mg by mouth at bedtime as needed. Always take diclofenac with snack.  Call office with progress report in one month regarding constipation.

## 2016-04-22 ENCOUNTER — Other Ambulatory Visit: Payer: Self-pay | Admitting: Licensed Clinical Social Worker

## 2016-04-22 NOTE — Patient Outreach (Signed)
Assessment:  CSW spoke via phone with client. CSW verified client identity. CSW and client spoke of client needs.Client sees Dr. [name] as primary care doctor. Client has prescribed medications and takes medications as prescribed. Client has support from [place], [name], Therapist, sports.  Client said that her spouse transports her to and from her medical and mental health appointments. Client sees Dr. Harrington Challenger, psychiatrist, for mental health support. Client said that her next scheduled appointment with Dr. Harrington Challenger is for 06/12/16.  CSW and client spoke of client care plan.  CSW encouraged client to communicate with CSW in next 30 days to discuss affordable housing options for client in the area. CSW and client spoke of housing options for client in the [place], [place] area. CSW encouraged client to continue to use relaxation techniques to help client manage anxiety and stress symptoms. CSW has given client name and phone number for [facility] and has encouraged client to call that agency to seek food support for client.  Client said that she feels that having a daily routine helps her manage her depression symptoms. She is also taking on line educational classes and enjoys her online classes.  CSW thanked client for phone conversation with CSW on 04/22/16. CSW encouraged client to call CSW at [phone] as needed to discuss social work needs of client. Client was appreciative of call from [place] on 04/22/16.     Plan:  Client to communicate with CSW in next 30 days to discuss affordable housing options for client in the area.  CSW to call client in 4 weeks to assess client needs at that time.   Norva Riffle.Shekita Boyden MSW, LCSW Licensed Clinical Social Worker [facility] Care Management [phone]

## 2016-04-22 NOTE — Patient Outreach (Signed)
Assessment:  CSW spoke via phone with client. CSW verified client identity. CSW and client spoke of client needs.  CSW and client did discuss housing options for client on 04/22/16. Client said she had inquired about one housing complex but the apartment monthly rent was too high for her to afford at that complex. She said she was now thinking of looking for a larger house in the local area of [place], [place] to rent.  CSW and client spoke of client care plan.  CSW encouraged client to communicate with CSW in next 30 days to discuss affordable housing options for client in the area.  CSW has given client [facility] CSW number of [phone] and encouraged client to call CSW as needed to discuss social work needs of client.  Plan:  Client to communicate with CSW in next 30 days to discuss affordable housing options for client in the area.   CSW to call client as scheduled to assess client needs.  Norva Riffle.Nalea Salce MSW, LCSW Licensed Clinical Social Worker [facility] Care Management [phone]

## 2016-04-28 ENCOUNTER — Other Ambulatory Visit: Payer: Self-pay

## 2016-04-28 NOTE — Patient Outreach (Signed)
[facility]) Care Management  [place]  04/28/2016   [name] A Besaw 01-Nov-1970 [id number]  Subjective: Telephone call to patient for monthly call. Patient reports that her sugars have been up as she had been at the hospital with her mom and not in her normal routine.  She reports her last sugar check was 253.  Discussed with patient getting back to normal routine and diet to get her sugars under control. She verbalized understanding. Patient also reports some increased pain in feet and legs. Rates at a 6/10. Patient is taking gabapentin for pain. She states she has a doctors appointment on tomorrow and will discuss with physician.  Discussed with patient pain control and keeping doctor informed about her changes in her pain.  She verbalized understanding.    Objective:   Encounter Medications:  Outpatient Encounter Prescriptions as of 04/28/2016  Medication Sig  . albuterol (PROVENTIL HFA;VENTOLIN HFA) 108 (90 BASE) MCG/ACT inhaler Inhale 2 puffs into the lungs every 6 (six) hours as needed for wheezing. Reported on 10/03/2015  . aspirin EC 81 MG tablet Take 81 mg by mouth daily.  Marland Kitchen atorvastatin (LIPITOR) 10 MG tablet Take 1 tablet by mouth daily.  . benztropine (COGENTIN) 1 MG tablet Take 1 tablet (1 mg total) by mouth at bedtime.  . cyclobenzaprine (FLEXERIL) 10 MG tablet Take 1 tablet (10 mg total) by mouth 3 (three) times daily as needed.  . diclofenac (VOLTAREN) 75 MG EC tablet Take 1 tablet (75 mg total) by mouth 2 (two) times daily as needed.  . DULoxetine (CYMBALTA) 60 MG capsule Take 1 capsule (60 mg total) by mouth daily at 6 PM.  . gabapentin (NEURONTIN) 300 MG capsule Take 300 mg by mouth 3 (three) times daily.   Marland Kitchen HYDROcodone-acetaminophen (NORCO/VICODIN) 5-325 MG tablet Take 1 tablet by mouth every 6 (six) hours as needed.  . insulin lispro (HUMALOG) 100 UNIT/ML injection Inject 15 Units into the skin 3 (three) times daily before meals.   Marland Kitchen levothyroxine  (SYNTHROID, LEVOTHROID) 25 MCG tablet Take 25 mcg by mouth daily before breakfast.   . linaclotide (LINZESS) 290 MCG CAPS capsule Take 1 capsule (290 mcg total) by mouth daily before breakfast.  . lisinopril (PRINIVIL,ZESTRIL) 40 MG tablet Take 40 mg by mouth daily.  Marland Kitchen omeprazole (PRILOSEC) 20 MG capsule Take 1 capsule (20 mg total) by mouth daily before supper.  . ondansetron (ZOFRAN) 4 MG tablet Take 1 tablet (4 mg total) by mouth every 8 (eight) hours as needed for nausea or vomiting.  . pioglitazone (ACTOS) 15 MG tablet Take 15 mg by mouth daily.  . ranitidine (ZANTAC) 150 MG tablet Take 1 tablet (150 mg total) by mouth at bedtime as needed for heartburn.  . risperiDONE (RISPERDAL) 2 MG tablet Take 2 tablets (4 mg total) by mouth at bedtime.  [name] SOLOSTAR 300 UNIT/ML SOPN Inject 20-70 Units into the skin at bedtime. 70 units in the morning and 20 units at bedtime  . traMADol (ULTRAM) 50 MG tablet Take 1 tablet (50 mg total) by mouth every 6 (six) hours as needed.  . traZODone (DESYREL) 150 MG tablet Take 1 tablet (150 mg total) by mouth at bedtime.  . Vitamin D, Ergocalciferol, (DRISDOL) 50000 units CAPS capsule Take 50,000 Units by mouth every Sunday.    No facility-administered encounter medications on file as of 04/28/2016.     Functional Status:  In your present state of health, do you have any difficulty performing the following activities:  01/02/2016 10/05/2015  Hearing? N N  Vision? N N  Difficulty concentrating or making decisions? N N  Walking or climbing stairs? N N  Dressing or bathing? N N  Doing errands, shopping? N N  Preparing Food and eating ? N N  Using the Toilet? N N  In the past six months, have you accidently leaked urine? N N  Do you have problems with loss of bowel control? N N  Managing your Medications? N N  Managing your Finances? N Y  Housekeeping or managing your Housekeeping? N N  Some recent data might be hidden    Fall/Depression Screening: PHQ  2/9 Scores 04/28/2016 03/31/2016 02/29/2016 02/05/2016 01/02/2016 12/13/2015 11/12/2015  PHQ - 2 Score 0 0 0 0 0 2 2  PHQ- 9 Score - - - - - 10 10    Assessment: Patient continues to benefit from health coach outreach for disease management and support.    Plan:  [facility] CM Care Plan Problem Two   Flowsheet Row Most Recent Value  Care Plan Problem Two  knowledge deficit related to diabetes as evidenced by questions around diabetes from patient  Role Documenting the Problem Two  [place] for Problem Two  Active  Interventions for Problem Two Long Term Goal   RN Health Coach reiterated with patient blood sugar readings, importance of diet, meal planning, and exercise in decreasing sugars.    THN Long Term Goal (31-90) days  Patient will have knowledge of diabetic diet over the next 90 days  THN Long Term Goal Start Date  03/31/16 [goal continued]  THN CM Short Term Goal #1 (0-30 days)  Patient will report feet and leg pain better controlled in the next 30 days.  THN CM Short Term Goal #1 Start Date  04/28/16  Interventions for Short Term Goal #2   [place] discussed with patient pain control and discussing pain with physician on appointment.       RN Health Coach will contact patient in the month of January and patient agrees to next outreach.  Jone Baseman, RN, MSN [place] [phone]

## 2016-05-07 ENCOUNTER — Ambulatory Visit (HOSPITAL_COMMUNITY): Admit: 2016-05-07 | Payer: Medicare Other

## 2016-05-07 ENCOUNTER — Encounter (HOSPITAL_COMMUNITY): Payer: Self-pay | Admitting: *Deleted

## 2016-05-07 ENCOUNTER — Emergency Department (HOSPITAL_COMMUNITY): Payer: Medicare Other

## 2016-05-07 ENCOUNTER — Emergency Department (HOSPITAL_COMMUNITY)
Admission: EM | Admit: 2016-05-07 | Discharge: 2016-05-07 | Disposition: A | Discharge status: Home / Self Care | Payer: Medicare Other | Attending: Emergency Medicine | Admitting: Emergency Medicine

## 2016-05-07 DIAGNOSIS — M79604 Pain in right leg: Secondary | ICD-10-CM | POA: Diagnosis present

## 2016-05-07 DIAGNOSIS — Z7982 Long term (current) use of aspirin: Secondary | ICD-10-CM | POA: Insufficient documentation

## 2016-05-07 DIAGNOSIS — Z794 Long term (current) use of insulin: Secondary | ICD-10-CM | POA: Diagnosis not present

## 2016-05-07 DIAGNOSIS — E119 Type 2 diabetes mellitus without complications: Secondary | ICD-10-CM | POA: Diagnosis not present

## 2016-05-07 DIAGNOSIS — M7989 Other specified soft tissue disorders: Secondary | ICD-10-CM

## 2016-05-07 DIAGNOSIS — M79661 Pain in right lower leg: Secondary | ICD-10-CM

## 2016-05-07 DIAGNOSIS — I10 Essential (primary) hypertension: Secondary | ICD-10-CM | POA: Insufficient documentation

## 2016-05-07 LAB — PREGNANCY, URINE: Preg Test, Ur: NEGATIVE

## 2016-05-07 MED ORDER — HYDROCODONE-ACETAMINOPHEN 5-325 MG PO TABS
1.0000 | ORAL_TABLET | Freq: Once | ORAL | Status: AC
  Administered 2016-05-07: 1 via ORAL
  Filled 2016-05-07: qty 1

## 2016-05-07 MED ORDER — KETOROLAC TROMETHAMINE 30 MG/ML IJ SOLN
30.0000 mg | Freq: Once | INTRAMUSCULAR | Status: AC
  Administered 2016-05-07: 30 mg via INTRAMUSCULAR
  Filled 2016-05-07: qty 1

## 2016-05-07 MED ORDER — NAPROXEN 500 MG PO TABS: 500.0000 mg | ORAL_TABLET | Freq: Two times a day (BID) | ORAL | 0 refills | Status: DC

## 2016-05-07 NOTE — ED Triage Notes (Signed)
Pt c/o pain to right leg that started suddenly at 2130 this evening; pt states she took 2 tylenol with no relief; pt has positive pedal pulses to bilateral feet

## 2016-05-07 NOTE — ED Provider Notes (Signed)
[place] DEPT Provider Note   CSN: [id number] Arrival date & time: 05/07/16  0052     History   Chief Complaint Chief Complaint  Patient presents with  . Leg Pain    HPI Kathryn [name] is a 45 y.o. female.  HPI  This 45 year old female with history of diabetes, hypertension, schizoaffective disorder who presents with right leg pain. Acute onset of right leg pain while watching TV earlier tonight. She states that she was lying on the couch when she had onset of tingling in her right foot and pain in her right hip. She stated the pain is sharp and goes down her entire leg. She denies injury. Current pain is 8 out of 10. She took 2 Tylenol without relief. Denies any weakness in the leg. No prior similar symptoms. Denies swelling or history of blood clots.  Past Medical History:  Diagnosis Date  . Anemia   . Anxiety   . Bipolar disorder ([place])   . Depression   . Diabetes mellitus   . Hypertension   . Schizoaffective disorder [facility])     Patient Active Problem List   Diagnosis Date Noted  . GERD (gastroesophageal reflux disease) 10/08/2015  . IBS (irritable colon syndrome) 10/08/2015  . Agitation 09/07/2015  . Lithium toxicity 09/07/2015  . Altered mental status   . Elevated liver enzymes 09/06/2015  . AKI (acute kidney injury) ([place]) 09/06/2015  . Diabetes ([place]) 08/22/2015  . Essential hypertension 08/22/2015  . Schizoaffective disorder ([place]) 02/08/2015  . PTSD (post-traumatic stress disorder) 02/08/2015  . Acute low back pain due to trauma 09/21/2012    Past Surgical History:  Procedure Laterality Date  . BREAST SURGERY    . CESAREAN SECTION    . CHOLECYSTECTOMY  06/02/2012   Procedure: LAPAROSCOPIC CHOLECYSTECTOMY;  Surgeon: Jamesetta So, MD;  Location: [facility] ORS;  Service: General;  Laterality: N/A;  Attempted laparoscopic cholecystectomy  . CHOLECYSTECTOMY  06/02/2012   Procedure: CHOLECYSTECTOMY;  Surgeon: Jamesetta So, MD;  Location: [facility] ORS;  Service:  General;  Laterality: N/A;  converted to open at  0905  . ESOPHAGOGASTRODUODENOSCOPY (EGD) WITH PROPOFOL N/A 08/24/2015   Procedure: ESOPHAGOGASTRODUODENOSCOPY (EGD) WITH PROPOFOL;  Surgeon: Rogene Houston, MD;  Location: [facility] ENDO SUITE;  Service: Endoscopy;  Laterality: N/A;  1:10 - Ann to notify pt to arrive at 11:30  . TUBAL LIGATION     X3    OB History    Gravida Para Term Preterm AB Living   3 3 3     3    SAB TAB Ectopic Multiple Live Births                   Home Medications    Prior to Admission medications   Medication Sig Start Date End Date Taking? Authorizing Provider  albuterol (PROVENTIL HFA;VENTOLIN HFA) 108 (90 BASE) MCG/ACT inhaler Inhale 2 puffs into the lungs every 6 (six) hours as needed for wheezing. Reported on 10/03/2015    Historical Provider, MD  aspirin EC 81 MG tablet Take 81 mg by mouth daily.    Historical Provider, MD  atorvastatin (LIPITOR) 10 MG tablet Take 1 tablet by mouth daily. 05/04/15   Historical Provider, MD  benztropine (COGENTIN) 1 MG tablet Take 1 tablet (1 mg total) by mouth at bedtime. 03/12/16 03/12/17  Cloria Spring, MD  cyclobenzaprine (FLEXERIL) 10 MG tablet Take 1 tablet (10 mg total) by mouth 3 (three) times daily as needed. 01/26/16   Tammy Triplett, PA-C  diclofenac (VOLTAREN) 75 MG EC tablet Take 1 tablet (75 mg total) by mouth 2 (two) times daily as needed. 04/08/16   Rogene Houston, MD  DULoxetine (CYMBALTA) 60 MG capsule Take 1 capsule (60 mg total) by mouth daily at 6 PM. 03/12/16   Cloria Spring, MD  gabapentin (NEURONTIN) 300 MG capsule Take 300 mg by mouth 3 (three) times daily.  03/28/15   Historical Provider, MD  HYDROcodone-acetaminophen (NORCO/VICODIN) 5-325 MG tablet Take 1 tablet by mouth every 6 (six) hours as needed. 10/29/15   Blanchie Dessert, MD  insulin lispro (HUMALOG) 100 UNIT/ML injection Inject 15 Units into the skin 3 (three) times daily before meals.     Historical Provider, MD  levothyroxine (SYNTHROID,  LEVOTHROID) 25 MCG tablet Take 25 mcg by mouth daily before breakfast.  04/15/15   Historical Provider, MD  linaclotide (LINZESS) 290 MCG CAPS capsule Take 1 capsule (290 mcg total) by mouth daily before breakfast. 04/08/16   Rogene Houston, MD  lisinopril (PRINIVIL,ZESTRIL) 40 MG tablet Take 40 mg by mouth daily.    Historical Provider, MD  naproxen (NAPROSYN) 500 MG tablet Take 1 tablet (500 mg total) by mouth 2 (two) times daily. 05/07/16   Merryl Hacker, MD  omeprazole (PRILOSEC) 20 MG capsule Take 1 capsule (20 mg total) by mouth daily before supper. 04/08/16   Rogene Houston, MD  ondansetron (ZOFRAN) 4 MG tablet Take 1 tablet (4 mg total) by mouth every 8 (eight) hours as needed for nausea or vomiting. 10/29/15   Blanchie Dessert, MD  pioglitazone (ACTOS) 15 MG tablet Take 15 mg by mouth daily.    Historical Provider, MD  ranitidine (ZANTAC) 150 MG tablet Take 1 tablet (150 mg total) by mouth at bedtime as needed for heartburn. 04/08/16   Rogene Houston, MD  risperiDONE (RISPERDAL) 2 MG tablet Take 2 tablets (4 mg total) by mouth at bedtime. 03/12/16 03/12/17  Cloria Spring, MD  TOUJEO SOLOSTAR 300 UNIT/ML SOPN Inject 20-70 Units into the skin at bedtime. 70 units in the morning and 20 units at bedtime 04/15/15   Historical Provider, MD  traMADol (ULTRAM) 50 MG tablet Take 1 tablet (50 mg total) by mouth every 6 (six) hours as needed. 01/26/16   Tammy Triplett, PA-C  traZODone (DESYREL) 150 MG tablet Take 1 tablet (150 mg total) by mouth at bedtime. 03/12/16   Cloria Spring, MD  Vitamin D, Ergocalciferol, (DRISDOL) 50000 units CAPS capsule Take 50,000 Units by mouth every Sunday.     Historical Provider, MD    Family History Family History  Problem Relation Age of Onset  . Hypertension Mother   . Alcohol abuse Mother   . Hypertension Father   . Alcohol abuse Sister   . Stroke Other   . Diabetes Other   . Cancer Other   . Seizures Other   . Alcohol abuse Maternal Aunt   . Alcohol  abuse Paternal Aunt   . Alcohol abuse Maternal Grandfather   . Alcohol abuse Maternal Grandmother   . Alcohol abuse Cousin     Social History Social History  Substance Use Topics  . Smoking status: Never Smoker  . Smokeless tobacco: Never Used  . Alcohol use No     Allergies   Patient has no known allergies.   Review of Systems Review of Systems  Constitutional: Negative for fever.  Cardiovascular: Negative for leg swelling.  Musculoskeletal:       Right leg and hip pain  Skin: Negative for color change and wound.  All other systems reviewed and are negative.    Physical Exam Updated Vital Signs BP 165/98 (BP Location: Left Arm)   Pulse 106   Temp 98 F (36.7 C) (Oral)   Resp 20   Ht 5' (1.524 m)   Wt 174 lb (78.9 kg)   LMP 01/23/2013   SpO2 99%   BMI 33.98 kg/m   Physical Exam  Constitutional: She is oriented to person, place, and time. She appears well-developed and well-nourished.  HENT:  Head: Normocephalic and atraumatic.  Cardiovascular: Normal rate, regular rhythm and normal heart sounds.   No murmur heard. Pulmonary/Chest: Effort normal and breath sounds normal. No respiratory distress. She has no wheezes.  Abdominal: Soft. There is no tenderness.  Musculoskeletal: Normal range of motion. She exhibits no edema or deformity.  Normal range of motion of the right hip and knee, pain with range of motion with both flexion and extension of the hip, tenderness palpation over the right greater trochanter, 2+ DP pulse  Neurological: She is alert and oriented to person, place, and time.  5 out of 5 strength with dorsiflexion, plantar flexion, knee and hip extension on the right  Skin: Skin is warm and dry.  Psychiatric: She has a normal mood and affect.  Nursing note and vitals reviewed.    ED Treatments / Results  Labs (all labs ordered are listed, but only abnormal results are displayed) Labs Reviewed  PREGNANCY, URINE    EKG  EKG  Interpretation None       Radiology Dg Hip Unilat W Or Wo Pelvis 2-3 Views Right  Result Date: 05/07/2016 CLINICAL DATA:  45 y/o  F; hip pain without known injury. EXAM: DG HIP (WITH OR WITHOUT PELVIS) 2-3V RIGHT COMPARISON:  01/14/2016 right hip radiographs. FINDINGS: There is no evidence of hip fracture or dislocation. There is no evidence of arthropathy or other focal bone abnormality. IMPRESSION: Negative. Electronically Signed   By: Kristine Garbe M.D.   On: 05/07/2016 02:51    Procedures Procedures (including critical care time)  Medications Ordered in ED Medications  ketorolac (TORADOL) 30 MG/ML injection 30 mg (30 mg Intramuscular Given 05/07/16 0139)  HYDROcodone-acetaminophen (NORCO/VICODIN) 5-325 MG per tablet 1 tablet (1 tablet Oral Given 05/07/16 0139)     Initial Impression / Assessment and Plan / ED Course  I have reviewed the triage vital signs and the nursing notes.  Pertinent labs & imaging results that were available during my care of the patient were reviewed by me and considered in my medical decision making (see chart for details).  Clinical Course     She presents with right atraumatic leg pain. Acute onset. Reports some shooting pains. Some indications of nerve or radicular pattern. Denies back pain. Is tender over the right hip. No known injury. Patient given pain medication. X-ray of the right hip negative. No swelling or obvious signs of DVT. However, given unknown etiology will bring back for ultrasound later today. Symptom control at home.  After history, exam, and medical workup I feel the patient has been appropriately medically screened and is safe for discharge home. Pertinent diagnoses were discussed with the patient. Patient was given return precautions.   Final Clinical Impressions(s) / ED Diagnoses   Final diagnoses:  Right leg pain    New Prescriptions New Prescriptions   NAPROXEN (NAPROSYN) 500 MG TABLET    Take 1 tablet  (500 mg total) by mouth 2 (two) times daily.  Merryl Hacker, MD 05/07/16 [phone]

## 2016-05-07 NOTE — Discharge Instructions (Signed)
You were seen today for leg pain. The cause of your pain is unknown but it appears to be either nerve or muscle related. Take naproxen twice daily as needed for pain. If your symptoms worsen you need to be reevaluated. If you develop swelling of the leg redness or any new or worsening symptoms she needs to be reevaluated immediately.

## 2016-05-23 ENCOUNTER — Other Ambulatory Visit: Payer: Self-pay | Admitting: Licensed Clinical Social Worker

## 2016-05-23 NOTE — Patient Outreach (Signed)
Assessment:  CSW spoke via phone with client. CSW verified client identity. CSW and client spoke of client needs. Client sees Dr. [name] as primary care doctor. Client said she will schedule a medical appointment with Dr. Maudie Mercury for client in  January of 2018.  Client said she had her prescribed medications and is taking medications as prescribed.  Client and CSW spoke of client care plan. CSW encouraged client to communicate with CSW in next 30 days to discuss affordable housing options for client in the area.  Client has support from [place], [name] RN. Client said that her spouse transports her to and from her medical and mental health appointments. Client sees Dr. Harrington Challenger, psychiatrist, for mental health support. Client said her next appointment with Dr. Harrington Challenger is on 06/12/16. CSW encouraged client to use relaxation techniques to help her manage stress and anxiety symptoms. Client said that having a daily routine helps her manage her mental health needs.  CSW had previously encouraged client to contact [facility] to inquire about food support for client.  Client said that she had an adequate food supply at present.  Client said she is still working on finding a new housing location. She said she is looking at renting a home near the home of her mother in [place], [place]. She said her mother needs some assistance at present. Client is hoping to rent a home near her mother in order for client to assist her mother as needed. Client was appreciative of [facility] support with RN [name] and with [place] encouraged client to call CSW at [phone] as needed to discuss social work needs of client. Client was appreciative of phone call to client from [place] on 05/23/16.    Plan:  Client to communicate with CSW in next 30 days to discuss affordable housing options for client in the area.    CSW to call client in 4 weeks to discuss client needs at that time.  Norva Riffle.Luian Schumpert MSW, LCSW Licensed Clinical Social Worker [facility] [phone]

## 2016-06-02 ENCOUNTER — Other Ambulatory Visit: Payer: Self-pay

## 2016-06-02 NOTE — Patient Outreach (Signed)
[facility]) Care Management  [place]  06/02/2016   Kathryn [name] 02-Dec-1970 [id number]  Subjective: Telephone call to patient for monthly call.  Patient reports she is doing fine but that her sugars are in the 200 range. Patient reports increase with her toujeo.  Medication review completed. Patient reports she goes to the doctor for follow up on tomorrow.  Discussed with patient diet and reviewed with patient A1c in correlation to blood sugar.  She verbalized understanding.  Will send education sheet on A1c and blood sugar.    Objective:   Encounter Medications:  Outpatient Encounter Prescriptions as of 06/02/2016  Medication Sig Note  . albuterol (PROVENTIL HFA;VENTOLIN HFA) 108 (90 BASE) MCG/ACT inhaler Inhale 2 puffs into the lungs every 6 (six) hours as needed for wheezing. Reported on 10/03/2015   . aspirin EC 81 MG tablet Take 81 mg by mouth daily.   Marland Kitchen atorvastatin (LIPITOR) 10 MG tablet Take 1 tablet by mouth daily.   . benztropine (COGENTIN) 1 MG tablet Take 1 tablet (1 mg total) by mouth at bedtime.   . cyclobenzaprine (FLEXERIL) 10 MG tablet Take 1 tablet (10 mg total) by mouth 3 (three) times daily as needed.   . diclofenac (VOLTAREN) 75 MG EC tablet Take 1 tablet (75 mg total) by mouth 2 (two) times daily as needed.   . DULoxetine (CYMBALTA) 60 MG capsule Take 1 capsule (60 mg total) by mouth daily at 6 PM.   . gabapentin (NEURONTIN) 300 MG capsule Take 300 mg by mouth 3 (three) times daily.    Marland Kitchen HYDROcodone-acetaminophen (NORCO/VICODIN) 5-325 MG tablet Take 1 tablet by mouth every 6 (six) hours as needed.   . insulin lispro (HUMALOG) 100 UNIT/ML injection Inject 15 Units into the skin 3 (three) times daily before meals.    Marland Kitchen levothyroxine (SYNTHROID, LEVOTHROID) 25 MCG tablet Take 25 mcg by mouth daily before breakfast.    . linaclotide (LINZESS) 290 MCG CAPS capsule Take 1 capsule (290 mcg total) by mouth daily before breakfast.   . lisinopril  (PRINIVIL,ZESTRIL) 40 MG tablet Take 40 mg by mouth daily.   . naproxen (NAPROSYN) 500 MG tablet Take 1 tablet (500 mg total) by mouth 2 (two) times daily.   Marland Kitchen omeprazole (PRILOSEC) 20 MG capsule Take 1 capsule (20 mg total) by mouth daily before supper.   . ondansetron (ZOFRAN) 4 MG tablet Take 1 tablet (4 mg total) by mouth every 8 (eight) hours as needed for nausea or vomiting.   . pioglitazone (ACTOS) 15 MG tablet Take 15 mg by mouth daily.   . ranitidine (ZANTAC) 150 MG tablet Take 1 tablet (150 mg total) by mouth at bedtime as needed for heartburn.   . risperiDONE (RISPERDAL) 2 MG tablet Take 2 tablets (4 mg total) by mouth at bedtime.   [name] SOLOSTAR 300 UNIT/ML SOPN Inject 20-70 Units into the skin at bedtime. 70 units in the morning and 20 units at bedtime 06/02/2016: Patient taking 112 units daily  . traMADol (ULTRAM) 50 MG tablet Take 1 tablet (50 mg total) by mouth every 6 (six) hours as needed.   . traZODone (DESYREL) 150 MG tablet Take 1 tablet (150 mg total) by mouth at bedtime.   . Vitamin D, Ergocalciferol, (DRISDOL) 50000 units CAPS capsule Take 50,000 Units by mouth every Sunday.     No facility-administered encounter medications on file as of 06/02/2016.     Functional Status:  In your present state of health, do  you have any difficulty performing the following activities: 01/02/2016 10/05/2015  Hearing? N N  Vision? N N  Difficulty concentrating or making decisions? N N  Walking or climbing stairs? N N  Dressing or bathing? N N  Doing errands, shopping? N N  Preparing Food and eating ? N N  Using the Toilet? N N  In the past six months, have you accidently leaked urine? N N  Do you have problems with loss of bowel control? N N  Managing your Medications? N N  Managing your Finances? N Y  Housekeeping or managing your Housekeeping? N N  Some recent data might be hidden    Fall/Depression Screening: PHQ 2/9 Scores 06/02/2016 04/28/2016 03/31/2016 02/29/2016 02/05/2016  01/02/2016 12/13/2015  PHQ - 2 Score 0 0 0 0 0 0 2  PHQ- 9 Score - - - - - - 10    Assessment: Patient continues to benefit from health coach outreach for disease management and support.   Plan:  [facility] CM Care Plan Problem Two   Flowsheet Row Most Recent Value  Care Plan Problem Two  knowledge deficit related to diabetes as evidenced by questions around diabetes from patient  Role Documenting the Problem Two  [place] for Problem Two  Active  Interventions for Problem Two Long Term Goal   RN Health Coach reinforced with patient blood sugar readings, importance of diet, meal planning, and exercise in decreasing sugars.    THN Long Term Goal (31-90) days  Patient will have knowledge of diabetic diet over the next 90 days  THN Long Term Goal Start Date  06/02/16 [goal continued]  THN CM Short Term Goal #1 (0-30 days)  Patient will report feet and leg pain better controlled in the next 30 days.  THN CM Short Term Goal #1 Start Date  04/28/16  [facility] CM Short Term Goal #1 Met Date   06/02/16  Interventions for Short Term Goal #2   Patient reports pain better controlled and denies pain on conversation.      RN Health Coach will contact patient in the month of February and patient agrees to next outreach.  Jone Baseman, RN, MSN [place] [phone]

## 2016-06-12 ENCOUNTER — Ambulatory Visit (HOSPITAL_COMMUNITY): Payer: Self-pay | Admitting: Psychiatry

## 2016-06-27 ENCOUNTER — Other Ambulatory Visit: Payer: Self-pay | Admitting: Licensed Clinical Social Worker
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
## 2016-06-30 ENCOUNTER — Other Ambulatory Visit: Payer: Self-pay

## 2016-06-30 NOTE — Patient Outreach (Signed)
[facility]) Care Management  [place]  06/30/2016   Kathryn [name] [birth year] [id number]  Subjective: Telephone call to patient for monthly call.  Patient reports that her sugars are still in the 200's.  She reports she continues to make adjustments to her diet.  She reports that her primary doctor increased her tuojeo to 70 in the morning and 25 units at night.  Discussed with patient importance of limiting sweets in her diet as well as carbohydrates.  Also discussed meal items with patient. She verbalized understanding.   Objective:   Encounter Medications:  Outpatient Encounter Prescriptions as of 06/30/2016  Medication Sig Note  . albuterol (PROVENTIL HFA;VENTOLIN HFA) 108 (90 BASE) MCG/ACT inhaler Inhale 2 puffs into the lungs every 6 (six) hours as needed for wheezing. Reported on 10/03/2015   . aspirin EC 81 MG tablet Take 81 mg by mouth daily.   Marland Kitchen atorvastatin (LIPITOR) 10 MG tablet Take 1 tablet by mouth daily.   . benztropine (COGENTIN) 1 MG tablet Take 1 tablet (1 mg total) by mouth at bedtime.   . cyclobenzaprine (FLEXERIL) 10 MG tablet Take 1 tablet (10 mg total) by mouth 3 (three) times daily as needed.   . diclofenac (VOLTAREN) 75 MG EC tablet Take 1 tablet (75 mg total) by mouth 2 (two) times daily as needed.   . DULoxetine (CYMBALTA) 60 MG capsule Take 1 capsule (60 mg total) by mouth daily at 6 PM.   . gabapentin (NEURONTIN) 300 MG capsule Take 300 mg by mouth 3 (three) times daily.    Marland Kitchen HYDROcodone-acetaminophen (NORCO/VICODIN) 5-325 MG tablet Take 1 tablet by mouth every 6 (six) hours as needed.   . insulin lispro (HUMALOG) 100 UNIT/ML injection Inject 15 Units into the skin 3 (three) times daily before meals.    Marland Kitchen levothyroxine (SYNTHROID, LEVOTHROID) 25 MCG tablet Take 25 mcg by mouth daily before breakfast.    . linaclotide (LINZESS) 290 MCG CAPS capsule Take 1 capsule (290 mcg total) by mouth daily before breakfast.   . lisinopril  (PRINIVIL,ZESTRIL) 40 MG tablet Take 40 mg by mouth daily.   . naproxen (NAPROSYN) 500 MG tablet Take 1 tablet (500 mg total) by mouth 2 (two) times daily.   Marland Kitchen omeprazole (PRILOSEC) 20 MG capsule Take 1 capsule (20 mg total) by mouth daily before supper.   . ondansetron (ZOFRAN) 4 MG tablet Take 1 tablet (4 mg total) by mouth every 8 (eight) hours as needed for nausea or vomiting.   . pioglitazone (ACTOS) 15 MG tablet Take 15 mg by mouth daily.   . ranitidine (ZANTAC) 150 MG tablet Take 1 tablet (150 mg total) by mouth at bedtime as needed for heartburn.   . risperiDONE (RISPERDAL) 2 MG tablet Take 2 tablets (4 mg total) by mouth at bedtime.   [name] SOLOSTAR 300 UNIT/ML SOPN Inject 20-70 Units into the skin at bedtime. 70 units in the morning and 20 units at bedtime 06/30/2016: Patient taking 70 in am 25 at night  . traMADol (ULTRAM) 50 MG tablet Take 1 tablet (50 mg total) by mouth every 6 (six) hours as needed.   . traZODone (DESYREL) 150 MG tablet Take 1 tablet (150 mg total) by mouth at bedtime.   . Vitamin D, Ergocalciferol, (DRISDOL) 50000 units CAPS capsule Take 50,000 Units by mouth every Sunday.     No facility-administered encounter medications on file as of 06/30/2016.     Functional Status:  In your present state of  health, do you have any difficulty performing the following activities: 01/02/2016 10/05/2015  Hearing? N N  Vision? N N  Difficulty concentrating or making decisions? N N  Walking or climbing stairs? N N  Dressing or bathing? N N  Doing errands, shopping? N N  Preparing Food and eating ? N N  Using the Toilet? N N  In the past six months, have you accidently leaked urine? N N  Do you have problems with loss of bowel control? N N  Managing your Medications? N N  Managing your Finances? N Y  Housekeeping or managing your Housekeeping? N N  Some recent data might be hidden    Fall/Depression Screening: PHQ 2/9 Scores 06/30/2016 06/02/2016 04/28/2016 03/31/2016 02/29/2016  02/05/2016 01/02/2016  PHQ - 2 Score 0 0 0 0 0 0 0  PHQ- 9 Score - - - - - - -    Assessment: Patient continues to benefit from health coach outreach for disease management and support.     Plan:  [facility] CM Care Plan Problem Two   Flowsheet Row Most Recent Value  Care Plan Problem Two  knowledge deficit related to diabetes as evidenced by questions around diabetes from patient  Role Documenting the Problem Two  [place] for Problem Two  Active  Interventions for Problem Two Long Term Goal   RN Health Coach reviewed with patient blood sugar readings, importance of diet, meal planning, and exercise in decreasing sugars.    THN Long Term Goal (31-90) days  Patient will have knowledge of diabetic diet over the next 90 days  THN Long Term Goal Start Date  06/30/16 [facility] continued]      [place] will contact patient in the month of March and patient agrees to next outreach.  Jone Baseman, RN, MSN [place] [phone]

## 2016-07-01 ENCOUNTER — Telehealth (HOSPITAL_COMMUNITY): Payer: Self-pay | Admitting: *Deleted

## 2016-07-01 NOTE — Telephone Encounter (Signed)
left voice message regarding an appointment. 

## 2016-07-03 ENCOUNTER — Other Ambulatory Visit: Payer: Self-pay

## 2016-07-03 ENCOUNTER — Emergency Department (HOSPITAL_COMMUNITY)
Admission: EM | Admit: 2016-07-03 | Discharge: 2016-07-03 | Disposition: A | Discharge status: Home / Self Care | Payer: Medicare Other | Attending: Emergency Medicine | Admitting: Emergency Medicine

## 2016-07-03 ENCOUNTER — Encounter (HOSPITAL_COMMUNITY): Payer: Self-pay | Admitting: *Deleted

## 2016-07-03 ENCOUNTER — Emergency Department (HOSPITAL_COMMUNITY): Payer: Medicare Other

## 2016-07-03 DIAGNOSIS — Z794 Long term (current) use of insulin: Secondary | ICD-10-CM | POA: Insufficient documentation

## 2016-07-03 DIAGNOSIS — E119 Type 2 diabetes mellitus without complications: Secondary | ICD-10-CM | POA: Diagnosis not present

## 2016-07-03 DIAGNOSIS — R509 Fever, unspecified: Secondary | ICD-10-CM | POA: Insufficient documentation

## 2016-07-03 DIAGNOSIS — R05 Cough: Secondary | ICD-10-CM | POA: Insufficient documentation

## 2016-07-03 DIAGNOSIS — Z7982 Long term (current) use of aspirin: Secondary | ICD-10-CM | POA: Diagnosis not present

## 2016-07-03 DIAGNOSIS — Z79899 Other long term (current) drug therapy: Secondary | ICD-10-CM | POA: Diagnosis not present

## 2016-07-03 DIAGNOSIS — R69 Illness, unspecified: Secondary | ICD-10-CM

## 2016-07-03 DIAGNOSIS — I10 Essential (primary) hypertension: Secondary | ICD-10-CM | POA: Diagnosis not present

## 2016-07-03 DIAGNOSIS — R079 Chest pain, unspecified: Secondary | ICD-10-CM | POA: Diagnosis present

## 2016-07-03 DIAGNOSIS — J111 Influenza due to unidentified influenza virus with other respiratory manifestations: Secondary | ICD-10-CM

## 2016-07-03 LAB — BASIC METABOLIC PANEL
Anion gap: 9 (ref 5–15)
BUN: 11 mg/dL (ref 6–20)
CO2: 27 mmol/L (ref 22–32)
Calcium: 9.2 mg/dL (ref 8.9–10.3)
Chloride: 101 mmol/L (ref 101–111)
Creatinine, Ser: 0.82 mg/dL (ref 0.44–1.00)
GFR calc Af Amer: >60 mL/min (ref >60)
GFR calc non Af Amer: >60 mL/min (ref >60)
Glucose, Bld: 305 mg/dL — ABNORMAL HIGH (ref 65–99)
Potassium: 3.4 mmol/L — ABNORMAL LOW (ref 3.5–5.1)
Sodium: 137 mmol/L (ref 135–145)

## 2016-07-03 LAB — CBC
HCT: 37.8 % (ref 36.0–46.0)
Hemoglobin: 13.3 g/dL (ref 12.0–15.0)
MCH: 28.1 pg (ref 26.0–34.0)
MCHC: 35.2 g/dL (ref 30.0–36.0)
MCV: 79.7 fL (ref 78.0–100.0)
Platelets: 309 10*3/uL (ref 150–400)
RBC: 4.74 MIL/uL (ref 3.87–5.11)
RDW: 12.8 % (ref 11.5–15.5)
WBC: 5.5 10*3/uL (ref 4.0–10.5)

## 2016-07-03 LAB — TROPONIN I
Troponin I: <0.03 ng/mL (ref <0.03)
Troponin I: <0.03 ng/mL (ref <0.03)

## 2016-07-03 LAB — LITHIUM LEVEL: Lithium Lvl: <0.06 mmol/L — ABNORMAL LOW (ref 0.60–1.20)

## 2016-07-03 MED ORDER — DEXAMETHASONE SODIUM PHOSPHATE 10 MG/ML IJ SOLN
10.0000 mg | Freq: Once | INTRAMUSCULAR | Status: AC
  Administered 2016-07-03: 10 mg via INTRAVENOUS
  Filled 2016-07-03: qty 1

## 2016-07-03 MED ORDER — KETOROLAC TROMETHAMINE 30 MG/ML IJ SOLN
30.0000 mg | Freq: Once | INTRAMUSCULAR | Status: AC
  Administered 2016-07-03: 30 mg via INTRAVENOUS
  Filled 2016-07-03: qty 1

## 2016-07-03 MED ORDER — FENTANYL CITRATE (PF) 100 MCG/2ML IJ SOLN
50.0000 ug | Freq: Once | INTRAMUSCULAR | Status: AC
  Administered 2016-07-03: 50 ug via INTRAVENOUS
  Filled 2016-07-03: qty 2

## 2016-07-03 MED ORDER — SODIUM CHLORIDE 0.9 % IV BOLUS (SEPSIS)
1000.0000 mL | Freq: Once | INTRAVENOUS | Status: AC
  Administered 2016-07-03: 1000 mL via INTRAVENOUS

## 2016-07-03 NOTE — ED Provider Notes (Signed)
[facility] DEPT Provider Note   CSN: [id number] Arrival date & time: 07/03/16  1337     History   Chief Complaint Chief Complaint  Patient presents with  . Chest Pain    HPI Kathryn [name] is a 46 y.o. female.  46 year old female with multiple complaints. She states that she started with chills and fever on Saturday and is Saturday night she started having body aches and started her chest and is progressively increased to where her whole body is affected. Also on Sunday she started having a nonproductive cough. She is continued to have all these symptoms since that time. He's also developed a sore throat, weakness, nausea. She has not been around any one else that has been sick. Has not tried anything for the symptoms at home. Has not seen anyone else for the symptoms. No exacerbating, relieving or associated factors. No history of the same illness.      Past Medical History:  Diagnosis Date  . Anemia   . Anxiety   . Bipolar disorder ([place])   . Depression   . Diabetes mellitus   . Hypertension   . Schizoaffective disorder [facility])     Patient Active Problem List   Diagnosis Date Noted  . GERD (gastroesophageal reflux disease) 10/08/2015  . IBS (irritable colon syndrome) 10/08/2015  . Agitation 09/07/2015  . Lithium toxicity 09/07/2015  . Altered mental status   . Elevated liver enzymes 09/06/2015  . AKI (acute kidney injury) ([place]) 09/06/2015  . Diabetes ([place]) 08/22/2015  . Essential hypertension 08/22/2015  . Schizoaffective disorder ([place]) 02/08/2015  . PTSD (post-traumatic stress disorder) 02/08/2015  . Acute low back pain due to trauma 09/21/2012    Past Surgical History:  Procedure Laterality Date  . BREAST SURGERY Right    biopsy  . CESAREAN SECTION    . CHOLECYSTECTOMY  06/02/2012   Procedure: LAPAROSCOPIC CHOLECYSTECTOMY;  Surgeon: Jamesetta So, MD;  Location: [facility] ORS;  Service: General;  Laterality: N/A;  Attempted laparoscopic cholecystectomy  .  CHOLECYSTECTOMY  06/02/2012   Procedure: CHOLECYSTECTOMY;  Surgeon: Jamesetta So, MD;  Location: [facility] ORS;  Service: General;  Laterality: N/A;  converted to open at  0905  . ESOPHAGOGASTRODUODENOSCOPY (EGD) WITH PROPOFOL N/A 08/24/2015   Procedure: ESOPHAGOGASTRODUODENOSCOPY (EGD) WITH PROPOFOL;  Surgeon: Rogene Houston, MD;  Location: [facility] ENDO SUITE;  Service: Endoscopy;  Laterality: N/A;  1:10 - Ann to notify pt to arrive at 11:30  . TUBAL LIGATION     X3    OB History    Gravida Para Term Preterm AB Living   3 3 3     3    SAB TAB Ectopic Multiple Live Births                   Home Medications    Prior to Admission medications   Medication Sig Start Date End Date Taking? Authorizing Provider  acetaminophen (TYLENOL) 500 MG tablet Take 500 mg by mouth every 6 (six) hours as needed.   Yes Historical Provider, MD  albuterol (PROVENTIL HFA;VENTOLIN HFA) 108 (90 BASE) MCG/ACT inhaler Inhale 2 puffs into the lungs every 6 (six) hours as needed for wheezing. Reported on 10/03/2015   Yes Historical Provider, MD  aspirin EC 81 MG tablet Take 81 mg by mouth daily.   Yes Historical Provider, MD  atorvastatin (LIPITOR) 10 MG tablet Take 1 tablet by mouth daily. 05/04/15  Yes Historical Provider, MD  benztropine (COGENTIN) 1 MG tablet Take 1 tablet (1  mg total) by mouth at bedtime. 03/12/16 03/12/17 Yes Cloria Spring, MD  DULoxetine (CYMBALTA) 60 MG capsule Take 1 capsule (60 mg total) by mouth daily at 6 PM. 03/12/16  Yes Cloria Spring, MD  gabapentin (NEURONTIN) 300 MG capsule Take 300 mg by mouth 3 (three) times daily.  03/28/15  Yes Historical Provider, MD  insulin lispro (HUMALOG) 100 UNIT/ML injection Inject 15 Units into the skin 3 (three) times daily before meals.    Yes Historical Provider, MD  levothyroxine (SYNTHROID, LEVOTHROID) 25 MCG tablet Take 25 mcg by mouth daily before breakfast.  04/15/15  Yes Historical Provider, MD  linaclotide (LINZESS) 290 MCG CAPS capsule Take 1 capsule (290  mcg total) by mouth daily before breakfast. 04/08/16  Yes Rogene Houston, MD  lisinopril (PRINIVIL,ZESTRIL) 40 MG tablet Take 40 mg by mouth daily.   Yes Historical Provider, MD  naproxen (NAPROSYN) 500 MG tablet Take 1 tablet (500 mg total) by mouth 2 (two) times daily. 05/07/16  Yes Merryl Hacker, MD  omeprazole (PRILOSEC) 20 MG capsule Take 1 capsule (20 mg total) by mouth daily before supper. 04/08/16  Yes Rogene Houston, MD  ondansetron (ZOFRAN) 4 MG tablet Take 1 tablet (4 mg total) by mouth every 8 (eight) hours as needed for nausea or vomiting. 10/29/15  Yes Blanchie Dessert, MD  pioglitazone (ACTOS) 15 MG tablet Take 15 mg by mouth daily.   Yes Historical Provider, MD  ranitidine (ZANTAC) 150 MG tablet Take 1 tablet (150 mg total) by mouth at bedtime as needed for heartburn. 04/08/16  Yes Rogene Houston, MD  risperiDONE (RISPERDAL) 2 MG tablet Take 2 tablets (4 mg total) by mouth at bedtime. 03/12/16 03/12/17 Yes Cloria Spring, MD  TOUJEO SOLOSTAR 300 UNIT/ML SOPN Inject 20-70 Units into the skin at bedtime. 70 units in the morning and 20 units at bedtime 04/15/15  Yes Historical Provider, MD  traMADol (ULTRAM) 50 MG tablet Take 1 tablet (50 mg total) by mouth every 6 (six) hours as needed. 01/26/16  Yes Tammy Triplett, PA-C  traZODone (DESYREL) 150 MG tablet Take 1 tablet (150 mg total) by mouth at bedtime. 03/12/16  Yes Cloria Spring, MD  Vitamin D, Ergocalciferol, (DRISDOL) 50000 units CAPS capsule Take 50,000 Units by mouth every Sunday.     Historical Provider, MD    Family History Family History  Problem Relation Age of Onset  . Hypertension Mother   . Alcohol abuse Mother   . Hypertension Father   . Alcohol abuse Sister   . Stroke Other   . Diabetes Other   . Cancer Other   . Seizures Other   . Alcohol abuse Maternal Aunt   . Alcohol abuse Paternal Aunt   . Alcohol abuse Maternal Grandfather   . Alcohol abuse Maternal Grandmother   . Alcohol abuse Cousin      Social History Social History  Substance Use Topics  . Smoking status: Never Smoker  . Smokeless tobacco: Never Used  . Alcohol use No     Allergies   Patient has no known allergies.   Review of Systems Review of Systems  All other systems reviewed and are negative.    Physical Exam Updated Vital Signs BP 182/97   Pulse 79   Temp 98.2 F (36.8 C) (Oral)   Resp 22   Ht 5' (1.524 m)   Wt 165 lb (74.8 kg)   LMP 01/23/2013   SpO2 100%   BMI 32.22 kg/m  Physical Exam  Constitutional: She appears well-developed and well-nourished.  HENT:  Head: Normocephalic and atraumatic.  Eyes: Conjunctivae and EOM are normal.  Neck: Normal range of motion.  Cardiovascular: Normal rate and regular rhythm.   Pulmonary/Chest: Effort normal. No stridor. No respiratory distress.  Abdominal: Soft. She exhibits no distension. There is no tenderness.  Musculoskeletal: Normal range of motion. She exhibits no edema or deformity.  Neurological: She is alert.  Skin: Skin is warm and dry.  Nursing note and vitals reviewed.    ED Treatments / Results  Labs (all labs ordered are listed, but only abnormal results are displayed) Labs Reviewed  BASIC METABOLIC PANEL - Abnormal; Notable for the following:       Result Value   Potassium 3.4 (*)    Glucose, Bld 305 (*)    All other components within normal limits  LITHIUM LEVEL - Abnormal; Notable for the following:    Lithium Lvl <0.06 (*)    All other components within normal limits  CBC  TROPONIN I  TROPONIN I    EKG  EKG Interpretation  Date/Time:  Thursday July 03 2016 13:51:48 EST Ventricular Rate:  74 PR Interval:  138 QRS Duration: 76 QT Interval:  394 QTC Calculation: 437 R Axis:   9 Text Interpretation:  Normal sinus rhythm Normal ECG nonspecific ST changes in multiple leads similar to some previous ecgs, like lead placement Confirmed by [facility] MD, Corene Cornea [phone]) on 07/03/2016 3:22:36 PM       Radiology Dg  Chest 2 View  Result Date: 07/03/2016 CLINICAL DATA:  Chest pain and cough EXAM: CHEST  2 VIEW COMPARISON:  09/06/2015 FINDINGS: The heart size and mediastinal contours are within normal limits. Both lungs are clear. The visualized skeletal structures are unremarkable. IMPRESSION: No active cardiopulmonary disease. Electronically Signed   By: Inez Catalina M.D.   On: 07/03/2016 14:24    Procedures Procedures (including critical care time)  Medications Ordered in ED Medications  ketorolac (TORADOL) 30 MG/ML injection 30 mg (30 mg Intravenous Given 07/03/16 1559)  dexamethasone (DECADRON) injection 10 mg (10 mg Intravenous Given 07/03/16 1559)  sodium chloride 0.9 % bolus 1,000 mL (0 mLs Intravenous Stopped 07/03/16 1857)  fentaNYL (SUBLIMAZE) injection 50 mcg (50 mcg Intravenous Given 07/03/16 1558)     Initial Impression / Assessment and Plan / ED Course  I have reviewed the triage vital signs and the nursing notes.  Pertinent labs & imaging results that were available during my care of the patient were reviewed by me and considered in my medical decision making (see chart for details).     Suspect influenza-like illness however will also evaluate for pneumonia and lithium toxicity. We'll treat with supportive care at this time. She will need a delta troponin as well with the chest pain and some risk factors.  Delta troponin negative, will need PCP follow up. Lithium ok. Will treat with tylenol at home. Plan for pcp follow up for bp control as well.   Final Clinical Impressions(s) / ED Diagnoses   Final diagnoses:  Chest pain, unspecified type  Influenza-like illness    New Prescriptions Current Discharge Medication List       Merrily Pew, MD 07/03/16 [phone]

## 2016-07-03 NOTE — ED Triage Notes (Addendum)
Pt c/o chest tightness, SOB, cough, sore throat, nausea, chills, hot flashes, difficulty swallowing x couple days. Pt reports the pain goes down both arms, into her back and her into her neck.

## 2016-07-04 ENCOUNTER — Emergency Department (HOSPITAL_COMMUNITY)
Admission: EM | Admit: 2016-07-04 | Discharge: 2016-07-04 | Disposition: A | Discharge status: Home / Self Care | Payer: Medicare Other | Attending: Emergency Medicine | Admitting: Emergency Medicine

## 2016-07-04 ENCOUNTER — Encounter (HOSPITAL_COMMUNITY): Payer: Self-pay | Admitting: *Deleted

## 2016-07-04 DIAGNOSIS — I10 Essential (primary) hypertension: Secondary | ICD-10-CM | POA: Insufficient documentation

## 2016-07-04 DIAGNOSIS — Z79899 Other long term (current) drug therapy: Secondary | ICD-10-CM | POA: Diagnosis not present

## 2016-07-04 DIAGNOSIS — Z7982 Long term (current) use of aspirin: Secondary | ICD-10-CM | POA: Insufficient documentation

## 2016-07-04 DIAGNOSIS — E119 Type 2 diabetes mellitus without complications: Secondary | ICD-10-CM | POA: Insufficient documentation

## 2016-07-04 DIAGNOSIS — R079 Chest pain, unspecified: Secondary | ICD-10-CM | POA: Diagnosis not present

## 2016-07-04 DIAGNOSIS — Z794 Long term (current) use of insulin: Secondary | ICD-10-CM | POA: Insufficient documentation

## 2016-07-04 DIAGNOSIS — Z5321 Procedure and treatment not carried out due to patient leaving prior to being seen by health care provider: Secondary | ICD-10-CM | POA: Diagnosis not present

## 2016-07-04 NOTE — ED Notes (Addendum)
Patient called for twice from lobby, on two separate occasions.  Patient did not respond, will d/c

## 2016-07-04 NOTE — ED Triage Notes (Signed)
THE PT HAS BEEN C/O PAIN IN HER LT CHEST FOR 3 DAYS WITH NAUSEA.  SHE WAS SEEN AT [place] LAST NIGHT FOR THE SAME.  SHE HAD A COMPLETE CARDIAC WORK UP  SHECALLED HER DOCTOR TODAY  AND WAS TOLD TO COME TO THE ED  NO LABS OR CHEST XRAY DONE  SHE HAD THOSE LAST PM

## 2016-07-04 NOTE — ED Notes (Signed)
Patient up to desk.  States she would like to know how other people have been taken back before she has.  This RN attempted to explain acuity levels to patient.  Patient states "they haven't done anyhting for me though, how do they know what my acuity level is?".  This RN looked up patient's chart and attempted to inform patient about EKG.  Patient did not have stickers on, and states she was not given an EKG.  Patient told RN "people can die at this hospital.  I'm leaving and going somewhere else".  This RN asked if she could take patient back for EKG,

## 2016-07-11 ENCOUNTER — Encounter (HOSPITAL_COMMUNITY): Payer: Self-pay | Admitting: Emergency Medicine

## 2016-07-11 ENCOUNTER — Emergency Department (HOSPITAL_COMMUNITY)
Admission: EM | Admit: 2016-07-11 | Discharge: 2016-07-11 | Disposition: A | Discharge status: Home / Self Care | Payer: Medicare Other | Attending: Emergency Medicine | Admitting: Emergency Medicine

## 2016-07-11 DIAGNOSIS — Z7982 Long term (current) use of aspirin: Secondary | ICD-10-CM | POA: Insufficient documentation

## 2016-07-11 DIAGNOSIS — R109 Unspecified abdominal pain: Secondary | ICD-10-CM | POA: Diagnosis present

## 2016-07-11 DIAGNOSIS — Z794 Long term (current) use of insulin: Secondary | ICD-10-CM | POA: Diagnosis not present

## 2016-07-11 DIAGNOSIS — Z79899 Other long term (current) drug therapy: Secondary | ICD-10-CM | POA: Diagnosis not present

## 2016-07-11 DIAGNOSIS — R1084 Generalized abdominal pain: Secondary | ICD-10-CM | POA: Diagnosis not present

## 2016-07-11 DIAGNOSIS — Z791 Long term (current) use of non-steroidal anti-inflammatories (NSAID): Secondary | ICD-10-CM | POA: Insufficient documentation

## 2016-07-11 DIAGNOSIS — I10 Essential (primary) hypertension: Secondary | ICD-10-CM | POA: Insufficient documentation

## 2016-07-11 DIAGNOSIS — E119 Type 2 diabetes mellitus without complications: Secondary | ICD-10-CM | POA: Insufficient documentation

## 2016-07-11 LAB — URINALYSIS, ROUTINE W REFLEX MICROSCOPIC
Bacteria, UA: NONE SEEN
Bilirubin Urine: NEGATIVE
Glucose, UA: >=500 mg/dL — AB
Ketones, ur: NEGATIVE mg/dL
Leukocytes, UA: NEGATIVE
Nitrite: NEGATIVE
Protein, ur: NEGATIVE mg/dL
Specific Gravity, Urine: 1.039 — ABNORMAL HIGH (ref 1.005–1.030)
pH: 5 (ref 5.0–8.0)

## 2016-07-11 LAB — COMPREHENSIVE METABOLIC PANEL
ALT: 18 U/L (ref 14–54)
AST: 21 U/L (ref 15–41)
Albumin: 4.3 g/dL (ref 3.5–5.0)
Alkaline Phosphatase: 102 U/L (ref 38–126)
Anion gap: 12 (ref 5–15)
BUN: 14 mg/dL (ref 6–20)
CO2: 25 mmol/L (ref 22–32)
Calcium: 9.5 mg/dL (ref 8.9–10.3)
Chloride: 98 mmol/L — ABNORMAL LOW (ref 101–111)
Creatinine, Ser: 0.97 mg/dL (ref 0.44–1.00)
GFR calc Af Amer: >60 mL/min (ref >60)
GFR calc non Af Amer: >60 mL/min (ref >60)
Glucose, Bld: 483 mg/dL — ABNORMAL HIGH (ref 65–99)
Potassium: 3.3 mmol/L — ABNORMAL LOW (ref 3.5–5.1)
Sodium: 135 mmol/L (ref 135–145)
Total Bilirubin: 1.3 mg/dL — ABNORMAL HIGH (ref 0.3–1.2)
Total Protein: 8.2 g/dL — ABNORMAL HIGH (ref 6.5–8.1)

## 2016-07-11 LAB — LIPASE, BLOOD: Lipase: <10 U/L — ABNORMAL LOW (ref 11–51)

## 2016-07-11 LAB — CBC
HCT: 40.7 % (ref 36.0–46.0)
Hemoglobin: 14.5 g/dL (ref 12.0–15.0)
MCH: 28.4 pg (ref 26.0–34.0)
MCHC: 35.6 g/dL (ref 30.0–36.0)
MCV: 79.8 fL (ref 78.0–100.0)
Platelets: 357 10*3/uL (ref 150–400)
RBC: 5.1 MIL/uL (ref 3.87–5.11)
RDW: 12.9 % (ref 11.5–15.5)
WBC: 8 10*3/uL (ref 4.0–10.5)

## 2016-07-11 MED ORDER — DICYCLOMINE HCL 10 MG PO CAPS
10.0000 mg | ORAL_CAPSULE | Freq: Once | ORAL | Status: AC
  Administered 2016-07-11: 10 mg via ORAL
  Filled 2016-07-11: qty 1

## 2016-07-11 MED ORDER — ONDANSETRON 4 MG PO TBDP
4.0000 mg | ORAL_TABLET | Freq: Once | ORAL | Status: AC
  Administered 2016-07-11: 4 mg via ORAL
  Filled 2016-07-11: qty 1

## 2016-07-11 MED ORDER — DICYCLOMINE HCL 20 MG PO TABS: 20.0000 mg | ORAL_TABLET | Freq: Two times a day (BID) | ORAL | 0 refills | Status: DC

## 2016-07-11 MED ORDER — ONDANSETRON HCL 4 MG PO TABS: 4.0000 mg | ORAL_TABLET | Freq: Four times a day (QID) | ORAL | 0 refills | Status: DC

## 2016-07-11 NOTE — Discharge Instructions (Signed)

## 2016-07-11 NOTE — ED Triage Notes (Signed)
Pt is diabetic who reports her CBG is around 200- she has a 2 day history of abd pain and bilateral flank pain-  More frequent urination Followed by Dr Maudie Mercury in [place]

## 2016-07-11 NOTE — ED Provider Notes (Signed)
Emergency Department Provider Note   I have reviewed the triage vital signs and the nursing notes.   HISTORY  Chief Complaint Abdominal Pain and Flank Pain   HPI Kathryn [name] is a 46 y.o. female with PMH of bipolar, HTN, DM, schizoaffective, and anemia presents to the emergency pertinent for evaluation of left-sided abdominal pain. The patient states that pain began 2 days prior and has been worsening since that time. She describes it as both sharp and dull. Severe pain that is nonradiating. She denies any modifying factors. She has some associated diarrhea. Denies any nausea or vomiting. This is getting progressively worse. No history of similar pain.   Past Medical History:  Diagnosis Date  . Anemia   . Anxiety   . Bipolar disorder ([place])   . Depression   . Diabetes mellitus   . Hypertension   . Schizoaffective disorder [facility])     Patient Active Problem List   Diagnosis Date Noted  . GERD (gastroesophageal reflux disease) 10/08/2015  . IBS (irritable colon syndrome) 10/08/2015  . Agitation 09/07/2015  . Lithium toxicity 09/07/2015  . Altered mental status   . Elevated liver enzymes 09/06/2015  . AKI (acute kidney injury) ([place]) 09/06/2015  . Diabetes ([place]) 08/22/2015  . Essential hypertension 08/22/2015  . Schizoaffective disorder ([place]) 02/08/2015  . PTSD (post-traumatic stress disorder) 02/08/2015  . Acute low back pain due to trauma 09/21/2012    Past Surgical History:  Procedure Laterality Date  . BREAST SURGERY Right    biopsy  . CESAREAN SECTION    . CHOLECYSTECTOMY  06/02/2012   Procedure: LAPAROSCOPIC CHOLECYSTECTOMY;  Surgeon: Jamesetta So, MD;  Location: [facility] ORS;  Service: General;  Laterality: N/A;  Attempted laparoscopic cholecystectomy  . CHOLECYSTECTOMY  06/02/2012   Procedure: CHOLECYSTECTOMY;  Surgeon: Jamesetta So, MD;  Location: [facility] ORS;  Service: General;  Laterality: N/A;  converted to open at  0905  . ESOPHAGOGASTRODUODENOSCOPY (EGD) WITH  PROPOFOL N/A 08/24/2015   Procedure: ESOPHAGOGASTRODUODENOSCOPY (EGD) WITH PROPOFOL;  Surgeon: Rogene Houston, MD;  Location: [facility] ENDO SUITE;  Service: Endoscopy;  Laterality: N/A;  1:10 - Ann to notify pt to arrive at 11:30  . TUBAL LIGATION     X3    Current Outpatient Rx  . Order #: [id number] Class: Historical Med  . Order #: [id number] Class: Historical Med  . Order #: [id number] Class: Historical Med  . Order #: [id number] Class: Historical Med  . Order #: [id number] Class: Normal  . Order #: [id number] Class: Print  . Order #: [id number] Class: Normal  . Order #: [id number] Class: Historical Med  . Order #: [id number] Class: Historical Med  . Order #: [id number] Class: Historical Med  . Order #: [id number] Class: Normal  . Order #: [id number] Class: Historical Med  . Order #: [id number] Class: Print  . Order #: [id number] Class: Historical Med  . Order #: [id number] Class: Print  . Order #: [id number] Class: Historical Med  . Order #: [id number] Class: Print  . Order #: [id number] Class: Normal  . Order #: [id number] Class: Historical Med  . Order #: [id number] Class: Print  . Order #: [id number] Class: Normal  . Order #: [id number] Class: Historical Med    Allergies Patient has no known allergies.  Family History  Problem Relation Age of Onset  . Hypertension Mother   . Alcohol abuse Mother   . Hypertension Father   . Alcohol abuse Sister   . Stroke Other   . Diabetes Other   . Cancer Other   . Seizures Other   . Alcohol abuse Maternal Aunt   .  Alcohol abuse Paternal Aunt   . Alcohol abuse Maternal Grandfather   . Alcohol abuse Maternal Grandmother   . Alcohol abuse Cousin     Social History Social History  Substance Use Topics  . Smoking status: Never Smoker  . Smokeless tobacco: Never Used  . Alcohol use No    Review of Systems  Constitutional: No fever/chills Eyes: No visual changes. ENT: No sore throat. Cardiovascular: Denies chest pain. Respiratory: Denies shortness of  breath. Gastrointestinal: Positive left sided abdominal pain.  No nausea, no vomiting.  No diarrhea.  No constipation. Genitourinary: Negative for dysuria. Musculoskeletal: Negative for back pain. Skin: Negative for rash. Neurological: Negative for headaches, focal weakness or numbness.  10-point ROS otherwise negative.  ____________________________________________   PHYSICAL EXAM:  VITAL SIGNS: ED Triage Vitals  Enc Vitals Group     BP 07/11/16 1837 136/92     Pulse Rate 07/11/16 1837 (!) 123     Resp 07/11/16 1837 18     Temp 07/11/16 1837 98.7 F (37.1 C)     Temp Source 07/11/16 1837 Oral     SpO2 07/11/16 1837 98 %     Weight 07/11/16 1837 165 lb (74.8 kg)     Height 07/11/16 1837 5' (1.524 m)     Pain Score 07/11/16 1838 10    Constitutional: Alert and oriented. Well appearing and in no acute distress. Eyes: Conjunctivae are normal. Head: Atraumatic. Nose: No congestion/rhinnorhea. Mouth/Throat: Mucous membranes are moist. Oropharynx non-erythematous. Neck: No stridor.  Cardiovascular: Normal rate, regular rhythm. Good peripheral circulation. Grossly normal heart sounds.   Respiratory: Normal respiratory effort.  No retractions. Lungs CTAB. Gastrointestinal: Soft and nontender. No distention. No CVA tenderness.  Musculoskeletal: No lower extremity tenderness nor edema. No gross deformities of extremities. Neurologic:  Normal speech and language. No gross focal neurologic deficits are appreciated.  Skin:  Skin is warm, dry and intact. No rash noted. Psychiatric: Mood and affect are normal. Speech and behavior are normal.  ____________________________________________   LABS (all labs ordered are listed, but only abnormal results are displayed)  Labs Reviewed  LIPASE, BLOOD - Abnormal; Notable for the following:       Result Value   Lipase <10 (*)    All other components within normal limits  COMPREHENSIVE METABOLIC PANEL - Abnormal; Notable for the following:     Potassium 3.3 (*)    Chloride 98 (*)    Glucose, Bld 483 (*)    Total Protein 8.2 (*)    Total Bilirubin 1.3 (*)    All other components within normal limits  URINALYSIS, ROUTINE W REFLEX MICROSCOPIC - Abnormal; Notable for the following:    APPearance HAZY (*)    Specific Gravity, Urine 1.039 (*)    Glucose, UA >=500 (*)    Hgb urine dipstick MODERATE (*)    All other components within normal limits  CBC   ____________________________________________   PROCEDURES  Procedure(s) performed:   Procedures  None ____________________________________________   INITIAL IMPRESSION / ASSESSMENT AND PLAN / ED COURSE  Pertinent labs & imaging results that were available during my care of the patient were reviewed by me and considered in my medical decision making (see chart for details).  Patient resents to the emergency department for evaluation of left-sided abdominal pain. No dysuria. No fevers or chills. She does have some associated diarrhea. Abdomen is soft and mildly tender to palpation throughout. Patient is in no acute distress. No CVA tenderness. No gyn symptoms. Plan for labs  and reassess.   Labs unremarkable. Normal exam. No indication for further imaging or w/u at this time. Discharge with Zofran and Bentyl for symptomatic relief at home with plan to add Motrin/Tylenol as needed.   At this time, I do not feel there is any life-threatening condition present. I have reviewed and discussed all results (EKG, imaging, lab, urine as appropriate), exam findings with patient. I have reviewed nursing notes and appropriate previous records.  I feel the patient is safe to be discharged home without further emergent workup. Discussed usual and customary return precautions. Patient and family (if present) verbalize understanding and are comfortable with this plan.  Patient will follow-up with their primary care provider. If they do not have a primary care provider, information for  follow-up has been provided to them. All questions have been answered.  ____________________________________________  FINAL CLINICAL IMPRESSION(S) / ED DIAGNOSES  Final diagnoses:  Generalized abdominal pain     MEDICATIONS GIVEN DURING THIS VISIT:  Medications  dicyclomine (BENTYL) capsule 10 mg (10 mg Oral Given 07/11/16 2046)  ondansetron (ZOFRAN-ODT) disintegrating tablet 4 mg (4 mg Oral Given 07/11/16 2046)     NEW OUTPATIENT MEDICATIONS STARTED DURING THIS VISIT:  Discharge Medication List as of 07/11/2016  9:03 PM    START taking these medications   Details  dicyclomine (BENTYL) 20 MG tablet Take 1 tablet (20 mg total) by mouth 2 (two) times daily., Starting Fri 07/11/2016, Print         Note:  This document was prepared using Dragon voice recognition software and may include unintentional dictation errors.  Nanda Quinton, MD Emergency Medicine   Margette Fast, MD 07/12/16 [phone]

## 2016-07-11 NOTE — ED Notes (Signed)
Pt c/o pain as unchanged, edp notifed pt to take tylenol or motrin for her pain. Pt upset due to further testing not being conducted and states that she will sue the edp if her pcp finds something wrong with her.

## 2016-07-15 ENCOUNTER — Encounter (INDEPENDENT_AMBULATORY_CARE_PROVIDER_SITE_OTHER): Payer: Self-pay | Admitting: *Deleted

## 2016-07-15 ENCOUNTER — Ambulatory Visit (INDEPENDENT_AMBULATORY_CARE_PROVIDER_SITE_OTHER): Payer: Medicare Other | Admitting: Internal Medicine

## 2016-07-15 ENCOUNTER — Encounter (INDEPENDENT_AMBULATORY_CARE_PROVIDER_SITE_OTHER): Payer: Self-pay | Admitting: Internal Medicine

## 2016-07-15 VITALS — BP 134/84 | HR 84 | Temp 97.5°F | Ht 60.0 in | Wt 165.2 lb

## 2016-07-15 DIAGNOSIS — R1084 Generalized abdominal pain: Secondary | ICD-10-CM | POA: Diagnosis not present

## 2016-07-15 NOTE — Progress Notes (Signed)
   Subjective:    Patient ID: Kathryn [name], female    DOB: April 28, 1971, 46 y.o.   MRN: [id number]  HPI Here today for f/u after recent visit to [facility] for abdominal pain. Evaluated and released. Started on Bentyl. There was no fever.She says she still has some abdominal pain.  She says she is having diarrhea which is new. Stools are loose but not watery. She has stopped the LInzess since the diarrhea stopped. No melena or BRRB. She says she is not any better. Rates pain 9/10. Her appetite is not good. She has lost about 5 pounds since her visit in November. No fever. She has had nausea, no vomiting.  Symptoms for about a week.  She says the Bentyl has helped.  Blood sugars run around 200  She had EGD in March 2017 revealing normal esophagus and regular Z line and granular antral mucosa. Biopsy revealed chronic gastritis but no evidence of H. Pylori. Hx bipolar, HTN,  schizophremia, and anemia.  Hx of peripheral neuropathy and is on Gabapentin.  CBC    Component Value Date/Time   WBC 8.0 07/11/2016 1910   RBC 5.10 07/11/2016 1910   HGB 14.5 07/11/2016 1910   HCT 40.7 07/11/2016 1910   PLT 357 07/11/2016 1910   MCV 79.8 07/11/2016 1910   MCH 28.4 07/11/2016 1910   MCHC 35.6 07/11/2016 1910   RDW 12.9 07/11/2016 1910   LYMPHSABS 2.2 10/29/2015 0833   MONOABS 0.4 10/29/2015 0833   EOSABS 0.3 10/29/2015 0833   BASOSABS 0.0 10/29/2015 0833   Hepatic Function Latest Ref Rng & Units 07/11/2016 10/29/2015 10/02/2015  Total Protein 6.5 - 8.1 g/dL 8.2(H) 6.8 6.1  Albumin 3.5 - 5.0 g/dL 4.3 3.7 3.7  AST 15 - 41 U/L 21 14(L) 11  ALT 14 - 54 U/L _0 Alk Phosphatase 38 - 126 U/L 102 55 72  Total Bilirubin 0.3 - 1.2 mg/dL 1.3(H) 0.7 0.5  Bilirubin, Direct <=0.2 mg/dL - - 0.1    Review of Systems     Objective:   Physical Exam Blood pressure 134/84, pulse 84, temperature 97.5 F (36.4 C), height 5' (1.524 m), weight 165 lb 3.2 oz (74.9 kg), last menstrual period 01/23/2013. Alert and  oriented. Skin warm and dry. Oral mucosa is moist.   . Sclera anicteric, conjunctivae is pink. Thyroid not enlarged. No cervical lymphadenopathy. Lungs clear. Heart regular rate and rhythm.  Abdomen is soft. Bowel sounds are positive. No hepatomegaly. No abdominal masses felt. No tenderness.  No edema to lower extremities.        Assessment & Plan:  Abdominal pain, generalized. She does not appear to be in any acute distress.  Stop the Voltaren. Continue the Bentyl.  US abdomen. Further recommendations to follow.

## 2016-07-15 NOTE — Patient Instructions (Addendum)
US abdomen. Stop the Voltaren

## 2016-07-18 ENCOUNTER — Ambulatory Visit (HOSPITAL_COMMUNITY)
Admission: RE | Admit: 2016-07-18 | Discharge: 2016-07-18 | Disposition: A | Discharge status: Home / Self Care | Payer: Medicare Other | Source: Ambulatory Visit | Attending: Internal Medicine | Admitting: Internal Medicine

## 2016-07-18 DIAGNOSIS — Z9049 Acquired absence of other specified parts of digestive tract: Secondary | ICD-10-CM | POA: Diagnosis not present

## 2016-07-18 DIAGNOSIS — R1084 Generalized abdominal pain: Secondary | ICD-10-CM | POA: Diagnosis present

## 2016-07-23 ENCOUNTER — Telehealth (INDEPENDENT_AMBULATORY_CARE_PROVIDER_SITE_OTHER): Payer: Self-pay | Admitting: Internal Medicine

## 2016-07-23 DIAGNOSIS — K588 Other irritable bowel syndrome: Secondary | ICD-10-CM

## 2016-07-23 MED ORDER — DICYCLOMINE HCL 20 MG PO TABS: 20.0000 mg | ORAL_TABLET | Freq: Two times a day (BID) | ORAL | 5 refills | Status: DC

## 2016-07-23 NOTE — Telephone Encounter (Signed)
Rx for dicyclomine sent to her pharmacy 

## 2016-07-28 ENCOUNTER — Other Ambulatory Visit: Payer: Self-pay

## 2016-07-28 ENCOUNTER — Telehealth (INDEPENDENT_AMBULATORY_CARE_PROVIDER_SITE_OTHER): Payer: Self-pay | Admitting: Internal Medicine

## 2016-07-28 DIAGNOSIS — R110 Nausea: Secondary | ICD-10-CM

## 2016-07-28 DIAGNOSIS — R1013 Epigastric pain: Secondary | ICD-10-CM

## 2016-07-28 MED ORDER — SUCRALFATE 1 GM/10ML PO SUSP: 1.0000 g | Freq: Four times a day (QID) | ORAL | 1 refills | Status: DC

## 2016-07-28 NOTE — Patient Outreach (Signed)
[facility]) Care Management  [place]  07/28/2016   [name] A Newbold 1970-06-05 [id number]  Subjective: Telephone call to patient for monthly call.  Patient reports she is doing pretty good.  Discussed with patient her noted ED visit for chest pain and abdominal pain. Patient reports that she is doing some better and is seeing the gastroenterologist and that her bentyl was increased recently to 3 times a day.  Patient reports some improvement in abdominal pain.  Patient shares that she has not eating like she normally does due to pain and nausea and she noted some weight loss. Patient reports that she was told if she continues to have worsening problems to call the gastroenterologist back for follow up.  Discussed with patient worsening abdominal pain, nausea, and rapid weight loss and when to notify physician.  She verbalized understanding.  Patient reports that her sugars are in the 100's and attribute this to her decreased appetite.  Patient reports that she is still taking her insulin as ordered.  Discussed with patient importance of diet and maintaining the blood sugars during this time.  She verbalized understanding.    Objective:   Encounter Medications:  Outpatient Encounter Prescriptions as of 07/28/2016  Medication Sig Note  . albuterol (PROVENTIL HFA;VENTOLIN HFA) 108 (90 BASE) MCG/ACT inhaler Inhale 2 puffs into the lungs every 6 (six) hours as needed for wheezing. Reported on 10/03/2015   . aspirin EC 81 MG tablet Take 81 mg by mouth daily.   Marland Kitchen atorvastatin (LIPITOR) 10 MG tablet Take 1 tablet by mouth daily.   . benztropine (COGENTIN) 1 MG tablet Take 1 tablet (1 mg total) by mouth at bedtime.   . dicyclomine (BENTYL) 20 MG tablet Take 1 tablet (20 mg total) by mouth 2 (two) times daily. 07/28/2016: Patient taking three times a day.  . DULoxetine (CYMBALTA) 60 MG capsule Take 1 capsule (60 mg total) by mouth daily at 6 PM.   . gabapentin (NEURONTIN) 300 MG  capsule Take 300 mg by mouth 3 (three) times daily.    . insulin lispro (HUMALOG) 100 UNIT/ML injection Inject 15 Units into the skin 3 (three) times daily before meals.    Marland Kitchen levothyroxine (SYNTHROID, LEVOTHROID) 25 MCG tablet Take 25 mcg by mouth daily before breakfast.    . lisinopril (PRINIVIL,ZESTRIL) 40 MG tablet Take 40 mg by mouth daily.   Marland Kitchen omeprazole (PRILOSEC) 20 MG capsule Take 1 capsule (20 mg total) by mouth daily before supper.   . ondansetron (ZOFRAN) 4 MG tablet Take 1 tablet (4 mg total) by mouth every 6 (six) hours.   . pioglitazone (ACTOS) 15 MG tablet Take 15 mg by mouth daily.   . ranitidine (ZANTAC) 150 MG tablet Take 1 tablet (150 mg total) by mouth at bedtime as needed for heartburn.   . risperiDONE (RISPERDAL) 2 MG tablet Take 2 tablets (4 mg total) by mouth at bedtime.   [name] SOLOSTAR 300 UNIT/ML SOPN Inject 20-70 Units into the skin at bedtime. 70 units in the morning and 25 units at bedtime 06/30/2016: Patient taking 70 in am 25 at night  . traMADol (ULTRAM) 50 MG tablet Take 1 tablet (50 mg total) by mouth every 6 (six) hours as needed.   . traZODone (DESYREL) 150 MG tablet Take 1 tablet (150 mg total) by mouth at bedtime.   . Vitamin D, Ergocalciferol, (DRISDOL) 50000 units CAPS capsule Take 50,000 Units by mouth every Sunday.    . linaclotide (LINZESS) 290  MCG CAPS capsule Take 1 capsule (290 mcg total) by mouth daily before breakfast. (Patient not taking: Reported on 07/28/2016)   . naproxen (NAPROSYN) 500 MG tablet Take 1 tablet (500 mg total) by mouth 2 (two) times daily. (Patient not taking: Reported on 07/28/2016)    No facility-administered encounter medications on file as of 07/28/2016.     Functional Status:  In your present state of health, do you have any difficulty performing the following activities: 01/02/2016 10/05/2015  Hearing? N N  Vision? N N  Difficulty concentrating or making decisions? N N  Walking or climbing stairs? N N  Dressing or bathing? N N   Doing errands, shopping? N N  Preparing Food and eating ? N N  Using the Toilet? N N  In the past six months, have you accidently leaked urine? N N  Do you have problems with loss of bowel control? N N  Managing your Medications? N N  Managing your Finances? N Y  Housekeeping or managing your Housekeeping? N N  Some recent data might be hidden    Fall/Depression Screening: PHQ 2/9 Scores 07/28/2016 06/30/2016 06/02/2016 04/28/2016 03/31/2016 02/29/2016 02/05/2016  PHQ - 2 Score 0 0 0 0 0 0 0  PHQ- 9 Score - - - - - - -    Assessment: Patient continues to benefit from health coach outreach for disease management and support.    Plan:  [facility] CM Care Plan Problem One   Flowsheet Row Most Recent Value  Care Plan Problem One  Abdominal pain and nausea  Role Documenting the Problem One  [place] for Problem One  Active  THN CM Short Term Goal #1 (0-30 days)  Patient will report follow up with physician for new onset abdominal pain within 30 days.  THN CM Short Term Goal #1 Start Date  07/28/16  Interventions for Short Term Goal #1  RN Health Coach discussed with patient importance of follow up for abdominal pain. Also discussed insulin use and diet.  Also discussed with patient when to notify physician for worsening symptoms.      THN CM Care Plan Problem Two   Flowsheet Row Most Recent Value  Care Plan Problem Two  knowledge deficit related to diabetes as evidenced by questions around diabetes from patient  Role Documenting the Problem Two  [place] for Problem Two  Active  Interventions for Problem Two Long Term Goal   RN Health Coach reiterated with patient blood sugar readings, importance of diet, meal planning, and exercise in decreasing sugars.    THN Long Term Goal (31-90) days  Patient will have knowledge of diabetic diet over the next 90 days  THN Long Term Goal Start Date  07/28/16 [facility] continued]     [place] will contact patient in the month  of April and patient agrees to next outreach.  Jone Baseman, RN, MSN [place] [phone]

## 2016-07-28 NOTE — Telephone Encounter (Signed)
Patient wants to know if there is something else she can take for her stomachache, medication is not working.  [phone]

## 2016-07-28 NOTE — Telephone Encounter (Signed)
Rx for Carafate sent to her pharmacy 

## 2016-07-28 NOTE — Telephone Encounter (Signed)
Rx for Carafate called to her pharmacy

## 2016-07-29 ENCOUNTER — Other Ambulatory Visit: Payer: Self-pay | Admitting: Licensed Clinical Social Worker

## 2016-07-29 ENCOUNTER — Other Ambulatory Visit: Payer: Self-pay

## 2016-07-29 NOTE — Patient Outreach (Signed)
[facility]) Care Management  07/29/2016  Kathryn [name] 1970-12-16 [id number]  Telephone call to patient after follow up with social worker. Patient verifies that she is having problems with the cost of her tuojeo and linzess.  She reports that cost together is about $150.00 per month.  Patient reports that she faces difficulty in paying for those monthly.  Discussed with patient pharmacy referral to possibly help with cost of medications. Patient is agreeable.    Plan: RN Health Coach will send pharmacy referral for patient assistance in paying for medications.  RN Health Coach will contact patient in the month of April and patient agrees to next outreach.  Jone Baseman, RN, MSN [place] [phone]

## 2016-07-29 NOTE — Patient Outreach (Signed)
Assessment:  CSW spoke via phone with client. CSW verified client identity. CSW and client spoke of client needs. Client sees Dr. Maudie Mercury as primary care doctor. Client said she has a scheduled appointment with Dr. Maudie Mercury for 08/13/16. Client said she had her prescribed medications and is taking medications as prescribed.  CSW and client spoke of client care plan. CSW encouraged client to communicate with CSW in next 30 days to discuss affordable housing options for client in the community. Client has support from Kathryn [name], Kathryn Billings RN. Client said that her spouse transports client to and from client's medical and mental health appointments. Client sees Dr. Harrington Challenger, psyciatrist, for mental health support for client. CSW has encouraged client to use relaxation techniques to help client manage client anxiety and stress symptoms. Client said she likes listening to soothing music as a form of relaxation. She said she likes listening to gospel music to help her relax.  Client said she had experienced some abdominal pain recently. She contacted Dr. Laural Golden, gastroenterologist, about her symptoms.  She had an ultrasound one week ago.  She received a prescribed pain medication from Dr. Laural Golden. Client said she has nausea almost every day .  CSW informed Kathryn [name] that CSW would call [place], Kathryn Billings RN on 07/29/16 and update Kathryn [name] related to above information on client and update Kathryn [name] on current client stomach symptoms. Kathryn [name] agreed to this plan.   Client said she has some friends locally that she speaks with and visits with regularly.  Client said she hopes to find a home to rent that is near the home of her mother in [place], [place]. Client said her mother has some health issues and client hopes to be able to rent home near her mother to allow client to assist her mother regularly if needed. CSW spoke with client about [facility] program services. CSW encouraged Kathryn [name] to call CSW at [phone] as needed to discuss  social work needs of client. Kathryn [name] was appreciative of phone call received on 07/29/16 from [place].     Plan:  Client to communicate with CSW in next 30 days to discuss affordable housing options for client in the community.  CSW to call client in 4 weeks to assess client needs at that time.  Kathryn [name].Kathryn [name] MSW, LCSW Licensed Clinical Social Worker [facility] [phone]

## 2016-08-06 ENCOUNTER — Other Ambulatory Visit: Payer: Self-pay | Admitting: Pharmacist

## 2016-08-06 NOTE — Patient Outreach (Addendum)
Kathryn [name] is referred to pharmacy for medication assistance. Called and spoke with patient. HIPAA identifiers verified and verbal consent received.  [name] reports that she is in need of assistance with the cost of her Toujeo and Linzess. Note that per the Medicare.gov website, patient currently has partial extra help with the cost of her medications. Note that based on the eligibility criteria for the [employer] and [employer] patient assistance programs, the patient is not eligible for these programs because she has a partial extra help/low income subsidy. [name] reports that she has completed an application to apply for Medicaid today. [name] reports that she currently has samples of her Toujeo. Let [name] know that I will research other patient assistance options and then call her back.  Note that based on the wording of the patient assistance eligibility criteria through [facility], patient could also consider applying to [employer] to attempt to obtain Cymbalta and Humalog as the program appears to consider applicants who do not have Full Extra Help from [facility]. Note that this program does require the patient to spend $1,100 on medications this calendar year before becoming eligible. Call [name] back to share this information, but patient does not answer. Will try her back again on Friday, 08/08/16.  Harlow Asa, PharmD, [place] [facility] [phone]

## 2016-08-08 ENCOUNTER — Other Ambulatory Visit (INDEPENDENT_AMBULATORY_CARE_PROVIDER_SITE_OTHER): Payer: Self-pay | Admitting: Internal Medicine

## 2016-08-08 ENCOUNTER — Other Ambulatory Visit: Payer: Self-pay | Admitting: Pharmacist

## 2016-08-08 DIAGNOSIS — R1013 Epigastric pain: Secondary | ICD-10-CM

## 2016-08-08 NOTE — Patient Outreach (Signed)
Call to follow up with patient about medication assistance options. HIPAA identifiers verified and verbal consent received.  Let Ms. Suder know that based on the wording of the patient assistance eligibility criteria through [facility], patient could also consider applying to [employer] to attempt to obtain Cymbalta and Humalog as the program appears to consider applicants who do not have Full Extra Help from [facility]. Note that this program does require the patient to spend $1,100 on medications this calendar year before becoming eligible. Patient reports that she has not yet spent this amount out of pocket in this calendar year. Ask that patient calls me when she has met this out of pocket requirement.  Discuss with Ms. Dennard Schaumann the [phone] copay for her 90 day supply of Toujeo. Ms. Dennard Schaumann reports that she believes that, understanding now that this is the cost for a 3 month supply rather than a 1 month supply, she should be able to budget for this amount. Counsel patient on the importance of adherence to her insulin. Let patient know that if she is unable to afford this insulin, there are alternative options, such as Humulin NPH and R, that we can discuss with her PCP. Ms. Netta Neat understanding, but states that she is going to stick with the Toujeo and Humalog for now.   Patient denies further medication questions/concerns at this time. Confirm that patient has my phone number.  Will close pharmacy episode at this time.   Harlow Asa, PharmD, [place] [facility] [phone]

## 2016-08-14 ENCOUNTER — Other Ambulatory Visit (HOSPITAL_COMMUNITY): Payer: Self-pay | Admitting: Psychiatry

## 2016-08-18 ENCOUNTER — Other Ambulatory Visit: Payer: Medicare HMO | Admitting: Obstetrics & Gynecology

## 2016-08-26 ENCOUNTER — Ambulatory Visit (INDEPENDENT_AMBULATORY_CARE_PROVIDER_SITE_OTHER): Payer: 59 | Admitting: Psychiatry

## 2016-08-26 ENCOUNTER — Encounter (HOSPITAL_COMMUNITY): Payer: Self-pay | Admitting: Psychiatry

## 2016-08-26 ENCOUNTER — Encounter (INDEPENDENT_AMBULATORY_CARE_PROVIDER_SITE_OTHER): Payer: Self-pay

## 2016-08-26 VITALS — BP 164/111 | HR 86 | Ht 60.0 in | Wt 179.8 lb

## 2016-08-26 DIAGNOSIS — Z79899 Other long term (current) drug therapy: Secondary | ICD-10-CM

## 2016-08-26 DIAGNOSIS — F251 Schizoaffective disorder, depressive type: Secondary | ICD-10-CM | POA: Diagnosis not present

## 2016-08-26 DIAGNOSIS — Z811 Family history of alcohol abuse and dependence: Secondary | ICD-10-CM

## 2016-08-26 DIAGNOSIS — F4310 Post-traumatic stress disorder, unspecified: Secondary | ICD-10-CM | POA: Diagnosis not present

## 2016-08-26 MED ORDER — TRAZODONE HCL 100 MG PO TABS: 200.0000 mg | ORAL_TABLET | Freq: Every day | ORAL | 2 refills | Status: DC

## 2016-08-26 MED ORDER — BENZTROPINE MESYLATE 1 MG PO TABS: 1.0000 mg | ORAL_TABLET | Freq: Every day | ORAL | 2 refills | Status: DC

## 2016-08-26 MED ORDER — DULOXETINE HCL 60 MG PO CPEP: 60.0000 mg | ORAL_CAPSULE | Freq: Every day | ORAL | 2 refills | Status: DC

## 2016-08-26 MED ORDER — RISPERIDONE 2 MG PO TABS: 4.0000 mg | ORAL_TABLET | Freq: Every day | ORAL | 2 refills | Status: DC
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
## 2016-08-28 ENCOUNTER — Other Ambulatory Visit: Payer: Self-pay

## 2016-08-28 NOTE — Patient Outreach (Signed)
[facility]) Care Management  [place]  08/28/2016   [name] A Stigall 03-13-1971 [id number]  Subjective: Telephone call to patient for monthly call.  Patient reports that her stomach is much better.  She states that the carafate has made a difference.  Patient has been put on byetta injections now.  She states that her blood sugar today was 206.  But she states overall her sugars have been in the 100-200 range and she is happy about that.  Patient states she will see her doctor on 09-03-16 for follow up.  Discussed with patient diabetic diet and importance of limiting sugars and carbohydrates.  She verbalized understanding.    Objective:   Encounter Medications:  Outpatient Encounter Prescriptions as of 08/28/2016  Medication Sig Note  . albuterol (PROVENTIL HFA;VENTOLIN HFA) 108 (90 BASE) MCG/ACT inhaler Inhale 2 puffs into the lungs every 6 (six) hours as needed for wheezing. Reported on 10/03/2015   . aspirin EC 81 MG tablet Take 81 mg by mouth daily.   Marland Kitchen atorvastatin (LIPITOR) 10 MG tablet Take 1 tablet by mouth daily. 08/28/2016: Patient taking  . benztropine (COGENTIN) 1 MG tablet Take 1 tablet (1 mg total) by mouth at bedtime.   Marland Kitchen BYDUREON BCISE 2 MG/0.85ML AUIJ    . CARAFATE 1 GM/10ML suspension Take 10 mLs (1 g total) by mouth 4 (four) times daily.   . DULoxetine (CYMBALTA) 60 MG capsule Take 1 capsule (60 mg total) by mouth daily at 6 PM.   . gabapentin (NEURONTIN) 300 MG capsule Take 300 mg by mouth 3 (three) times daily.    . insulin lispro (HUMALOG) 100 UNIT/ML injection Inject 15 Units into the skin 3 (three) times daily before meals.    Marland Kitchen levothyroxine (SYNTHROID, LEVOTHROID) 25 MCG tablet Take 25 mcg by mouth daily before breakfast.    . lisinopril (PRINIVIL,ZESTRIL) 40 MG tablet Take 40 mg by mouth daily.   Marland Kitchen omeprazole (PRILOSEC) 20 MG capsule Take 1 capsule (20 mg total) by mouth daily before supper.   . ondansetron (ZOFRAN) 4 MG tablet Take 1 tablet (4  mg total) by mouth every 6 (six) hours.   . pioglitazone (ACTOS) 15 MG tablet Take 15 mg by mouth daily.   . ranitidine (ZANTAC) 150 MG tablet Take 1 tablet (150 mg total) by mouth at bedtime as needed for heartburn.   . risperiDONE (RISPERDAL) 2 MG tablet Take 2 tablets (4 mg total) by mouth at bedtime.   . sucralfate (CARAFATE) 1 GM/10ML suspension Take 10 mLs (1 g total) by mouth 4 (four) times daily.   [name] SOLOSTAR 300 UNIT/ML SOPN Inject 20-70 Units into the skin at bedtime. 70 units in the morning and 25 units at bedtime 08/26/2016: Per pt 70 in AM and 29 at night  . traMADol (ULTRAM) 50 MG tablet Take 1 tablet (50 mg total) by mouth every 6 (six) hours as needed.   . traZODone (DESYREL) 100 MG tablet Take 2 tablets (200 mg total) by mouth at bedtime.   . Vitamin D, Ergocalciferol, (DRISDOL) 50000 units CAPS capsule Take 50,000 Units by mouth every Sunday.    . dicyclomine (BENTYL) 20 MG tablet Take 1 tablet (20 mg total) by mouth 2 (two) times daily. (Patient not taking: Reported on 08/28/2016) 07/28/2016: Patient taking three times a day.  . linaclotide (LINZESS) 290 MCG CAPS capsule Take 1 capsule (290 mcg total) by mouth daily before breakfast. (Patient not taking: Reported on 08/28/2016)    No  facility-administered encounter medications on file as of 08/28/2016.     Functional Status:  In your present state of health, do you have any difficulty performing the following activities: 01/02/2016 10/05/2015  Hearing? N N  Vision? N N  Difficulty concentrating or making decisions? N N  Walking or climbing stairs? N N  Dressing or bathing? N N  Doing errands, shopping? N N  Preparing Food and eating ? N N  Using the Toilet? N N  In the past six months, have you accidently leaked urine? N N  Do you have problems with loss of bowel control? N N  Managing your Medications? N N  Managing your Finances? N Y  Housekeeping or managing your Housekeeping? N N  Some recent data might be hidden     Fall/Depression Screening: PHQ 2/9 Scores 08/28/2016 07/28/2016 06/30/2016 06/02/2016 04/28/2016 03/31/2016 02/29/2016  PHQ - 2 Score 0 0 0 0 0 0 0  PHQ- 9 Score - - - - - - -    Assessment: Patient continues to benefit from health coach outreach for disease management and support.   Plan:  [facility] CM Care Plan Problem One     Most Recent Value  Care Plan Problem One  Abdominal pain and nausea  Role Documenting the Problem One  [place] for Problem One  Active  [facility] CM Short Term Goal #1 (0-30 days)  Patient will report follow up with physician for new onset abdominal pain within 30 days.  THN CM Short Term Goal #1 Start Date  07/28/16  [facility] CM Short Term Goal #1 Met Date  08/28/16  Interventions for Short Term Goal #1  Patient reports no pain since taking carafate.     [facility] CM Care Plan Problem Two     Most Recent Value  Care Plan Problem Two  knowledge deficit related to diabetes as evidenced by questions around diabetes from patient  Role Documenting the Problem Two  [place] for Problem Two  Active  Interventions for Problem Two Long Term Goal   RN Health Coach reviewed with patient blood sugar readings, importance of diet, meal planning, and exercise in decreasing sugars.    THN Long Term Goal (31-90) days  Patient will have knowledge of diabetic diet over the next 90 days  THN Long Term Goal Start Date  08/28/16 [facility] continued]     [place] will contact patient in the month of May and patient agrees to next outreach.  Jone Baseman, RN, MSN [place] [phone]

## 2016-09-01 ENCOUNTER — Other Ambulatory Visit: Payer: Self-pay | Admitting: Licensed Clinical Social Worker

## 2016-09-01 NOTE — Patient Outreach (Signed)
Assessment:  CSW spoke via phone with client. CSW verified client identity. CSW and client spoke of client needs. Cient sees Dr. Maudie Mercury as primary care doctor. Client said she had her prescribed medications and is taking medications as prescribed.  CSW and client spoke of client care plan. CSW encouraged client to communicate with CSW in next 30 days to discuss affordable housing options for client in the area. Client said her spouse transports client to and from client's medical and mental health appointments. Client receives support from [place], [name], Therapist, sports.  Client sees Dr. Harrington Challenger, psychiatrist, as scheduled for mental health support for client.  Client said she had appointment with Dr. Harrington Challenger on 08/26/16.  She said she has appointment with Dr. Maudie Mercury on Friday of this week.  Client has plans to eventually move in to home of her mother in [place] to try to help with daily care needs of her mother. Client is talking with her mother and her brother about plans for client to move to home of her mother to assist with care needs of her mother. Client said she is taking a pain medication as prescribed. Client spoke of support she has been receiving from  [place], RN. CSW encouraged client to call CSW at [phone] as needed to discuss social work needs of client. Client was appreciative of phone call of CSW to client on 09/01/16.   Plan:  Client to communicate with CSW in next 30 days to discuss affordable housing options for client in the area.  CSW to call client in 4 weeks to assess client needs.  Norva Riffle.Lakai Moree MSW, LCSW Licensed Clinical Social Worker [facility] Care Management [phone]

## 2016-09-02 ENCOUNTER — Other Ambulatory Visit: Payer: Medicare HMO | Admitting: Obstetrics & Gynecology

## 2016-09-17 ENCOUNTER — Other Ambulatory Visit (INDEPENDENT_AMBULATORY_CARE_PROVIDER_SITE_OTHER): Payer: Self-pay | Admitting: Internal Medicine

## 2016-09-17 DIAGNOSIS — R1013 Epigastric pain: Secondary | ICD-10-CM

## 2016-09-23 ENCOUNTER — Other Ambulatory Visit: Payer: Self-pay

## 2016-09-23 NOTE — Patient Outreach (Signed)
[facility]) Care Management  [place]  09/23/2016   Kathryn [name] [birth year] [id number]  Subjective: Telephone call to patient for monthly call. Patient reports she is doing ok.  She reports that her sugars have been running steady with the last sugar being 207.  Patient continues to take her bydureon as ordered.  Patient is happy with sugar stabilization and reports that she does not feel as tired since starting it.  Reviewed with patient diabetic diet and items to eat.  She verbalized understanding.  Discussed with patient possible eligibility and that I would double check but insured her that I would get her information on disease management with [employer] if she is no longer eligible for [facility] Care Management.  She verbalized understanding.   Objective:   Encounter Medications:  Outpatient Encounter Prescriptions as of 09/23/2016  Medication Sig Note  . albuterol (PROVENTIL HFA;VENTOLIN HFA) 108 (90 BASE) MCG/ACT inhaler Inhale 2 puffs into the lungs every 6 (six) hours as needed for wheezing. Reported on 10/03/2015   . aspirin EC 81 MG tablet Take 81 mg by mouth daily.   Marland Kitchen atorvastatin (LIPITOR) 10 MG tablet Take 1 tablet by mouth daily. 08/28/2016: Patient taking  . benztropine (COGENTIN) 1 MG tablet Take 1 tablet (1 mg total) by mouth at bedtime.   Marland Kitchen BYDUREON BCISE 2 MG/0.85ML AUIJ    . CARAFATE 1 GM/10ML suspension Take 10 mLs (1 g total) by mouth 4 (four) times daily.   . DULoxetine (CYMBALTA) 60 MG capsule Take 1 capsule (60 mg total) by mouth daily at 6 PM.   . gabapentin (NEURONTIN) 300 MG capsule Take 300 mg by mouth 3 (three) times daily.    . insulin lispro (HUMALOG) 100 UNIT/ML injection Inject 15 Units into the skin 3 (three) times daily before meals.    Marland Kitchen levothyroxine (SYNTHROID, LEVOTHROID) 25 MCG tablet Take 25 mcg by mouth daily before breakfast.    . lisinopril (PRINIVIL,ZESTRIL) 40 MG tablet Take 40 mg by mouth daily.   Marland Kitchen omeprazole  (PRILOSEC) 20 MG capsule Take 1 capsule (20 mg total) by mouth daily before supper.   . ondansetron (ZOFRAN) 4 MG tablet Take 1 tablet (4 mg total) by mouth every 6 (six) hours.   . pioglitazone (ACTOS) 15 MG tablet Take 15 mg by mouth daily.   . ranitidine (ZANTAC) 150 MG tablet Take 1 tablet (150 mg total) by mouth at bedtime as needed for heartburn.   . risperiDONE (RISPERDAL) 2 MG tablet Take 2 tablets (4 mg total) by mouth at bedtime.   . sucralfate (CARAFATE) 1 GM/10ML suspension Take 10 mLs (1 g total) by mouth 4 (four) times daily.   [name] SOLOSTAR 300 UNIT/ML SOPN Inject 20-70 Units into the skin at bedtime. 70 units in the morning and 25 units at bedtime 08/26/2016: Per pt 70 in AM and 29 at night  . traMADol (ULTRAM) 50 MG tablet Take 1 tablet (50 mg total) by mouth every 6 (six) hours as needed.   . traZODone (DESYREL) 100 MG tablet Take 2 tablets (200 mg total) by mouth at bedtime.   . Vitamin D, Ergocalciferol, (DRISDOL) 50000 units CAPS capsule Take 50,000 Units by mouth every Sunday.    . dicyclomine (BENTYL) 20 MG tablet Take 1 tablet (20 mg total) by mouth 2 (two) times daily. (Patient not taking: Reported on 08/28/2016) 07/28/2016: Patient taking three times a day.  . linaclotide (LINZESS) 290 MCG CAPS capsule Take 1 capsule (  290 mcg total) by mouth daily before breakfast. (Patient not taking: Reported on 08/28/2016)    No facility-administered encounter medications on file as of 09/23/2016.     Functional Status:  In your present state of health, do you have any difficulty performing the following activities: 01/02/2016 10/05/2015  Hearing? N N  Vision? N N  Difficulty concentrating or making decisions? N N  Walking or climbing stairs? N N  Dressing or bathing? N N  Doing errands, shopping? N N  Preparing Food and eating ? N N  Using the Toilet? N N  In the past six months, have you accidently leaked urine? N N  Do you have problems with loss of bowel control? N N  Managing your  Medications? N N  Managing your Finances? N Y  Housekeeping or managing your Housekeeping? N N  Some recent data might be hidden    Fall/Depression Screening: Fall Risk  09/23/2016 08/28/2016 07/28/2016  Falls in the past year? No No No  Risk for fall due to : - - -   PHQ 2/9 Scores 09/23/2016 08/28/2016 07/28/2016 06/30/2016 06/02/2016 04/28/2016 03/31/2016  PHQ - 2 Score 0 0 0 0 0 0 0  PHQ- 9 Score - - - - - - -    Assessment: Patient continues to benefit from health coach outreach for disease management and support.    Plan:  [facility] CM Care Plan Problem Two     Most Recent Value  Care Plan Problem Two  knowledge deficit related to diabetes as evidenced by questions around diabetes from patient  Role Documenting the Problem [place] for Problem Two  Active  Interventions for Problem Two Long Term Goal   RN Health Coach discussed with patient blood sugar readings, importance of diet, meal planning, and exercise in decreasing sugars.    THN Long Term Goal (31-90) days  Patient will have knowledge of diabetic diet over the next 90 days  THN Long Term Goal Start Date  09/23/16 [facility] continued]     [place] will check on eligibility of patient and return call to patient within 10 business days.    Jone Baseman, RN, MSN [address] [phone]

## 2016-09-24 ENCOUNTER — Ambulatory Visit: Payer: Self-pay

## 2016-09-30 ENCOUNTER — Other Ambulatory Visit: Payer: Self-pay | Admitting: Licensed Clinical Social Worker

## 2016-09-30 NOTE — Patient Outreach (Signed)
Assessment:  CSW spoke via phone with client. CSW verified client identity. CSW and client spoke of client needs. Client sees Dr. Maudie Mercury as primary care doctor. Client has her next medical appointment with Dr. Maudie Mercury in June of 2018.  Client said she had her prescribed medications and is taking medications as prescribed. CSW and client spoke of client care plan. CSW encouraged client to communicate with CSW in next 30 days to discuss affordable housing options for client in the area. Client reported to [place] that her spouse is transporting client to and from client's medical and mental health appointments. Client receives telephonic support with Wink[place], [name], RN. Client sees Dr. Harrington Challenger, psychiatrist, as scheduled, for mental health support for client. Client said that her appointments with Dr. Harrington Challenger are helpful to client. She is hoping to be able to eventually move in to home of her mother to provide some daily care support for her mother in [place], [place]. Client said she has some pain in her legs and that she planned to talk with Dr. Maudie Mercury at her June medical appointment with Dr. Maudie Mercury about pain in her legs. CSW reminded client that [facility] progam support was available in areas of nursing, social work and pharmacy. CSW encouraged Kathryn [name] to call CSW at [phone] as needed to discuss social work needs of client. Client said that she and her brother are working together to plan to take care of daily needs of her mother. Client said she (client) was eating well and sleeping adequately. Client was appreciative of phone call from [place] on 09/30/16.    Plan:  Client to communicate with CSW in next 30 days to discuss affordable housing options for client in the area.  CSW to collaborate with RN [name] , [place], as needed in monitoring needs of client.   CSW to call client in 4 weeks to assess client needs at that time.  Norva Riffle.Lovelace Cerveny MSW, LCSW Licensed Clinical Social Worker [facility] Care  Management [phone]

## 2016-10-02 ENCOUNTER — Other Ambulatory Visit: Payer: Self-pay

## 2016-10-02 NOTE — Patient Outreach (Signed)
[facility]) Care Management  10/02/2016  Kathryn [name] [birth year] [id number]   Telephone call to patient to verify insurance card.  Patient reports that she does not have her insurance card with her.  Advised patient that I would call another time.  Plan: RN Health Coach will attempt patient again within 10 business days to obtain insurance information.    Jone Baseman, RN, MSN [place] [phone]

## 2016-10-04 ENCOUNTER — Emergency Department (HOSPITAL_COMMUNITY): Payer: Medicare Other

## 2016-10-04 ENCOUNTER — Encounter (HOSPITAL_COMMUNITY): Payer: Self-pay | Admitting: Emergency Medicine

## 2016-10-04 ENCOUNTER — Emergency Department (HOSPITAL_COMMUNITY)
Admission: EM | Admit: 2016-10-04 | Discharge: 2016-10-04 | Disposition: A | Discharge status: Home / Self Care | Payer: Medicare Other | Attending: Emergency Medicine | Admitting: Emergency Medicine

## 2016-10-04 DIAGNOSIS — I10 Essential (primary) hypertension: Secondary | ICD-10-CM | POA: Insufficient documentation

## 2016-10-04 DIAGNOSIS — E119 Type 2 diabetes mellitus without complications: Secondary | ICD-10-CM | POA: Insufficient documentation

## 2016-10-04 DIAGNOSIS — Z7982 Long term (current) use of aspirin: Secondary | ICD-10-CM | POA: Diagnosis not present

## 2016-10-04 DIAGNOSIS — W01198A Fall on same level from slipping, tripping and stumbling with subsequent striking against other object, initial encounter: Secondary | ICD-10-CM | POA: Insufficient documentation

## 2016-10-04 DIAGNOSIS — Z794 Long term (current) use of insulin: Secondary | ICD-10-CM | POA: Insufficient documentation

## 2016-10-04 DIAGNOSIS — Z79899 Other long term (current) drug therapy: Secondary | ICD-10-CM | POA: Insufficient documentation

## 2016-10-04 DIAGNOSIS — S8992XA Unspecified injury of left lower leg, initial encounter: Secondary | ICD-10-CM | POA: Diagnosis present

## 2016-10-04 DIAGNOSIS — S8392XA Sprain of unspecified site of left knee, initial encounter: Secondary | ICD-10-CM | POA: Insufficient documentation

## 2016-10-04 DIAGNOSIS — S8391XA Sprain of unspecified site of right knee, initial encounter: Secondary | ICD-10-CM | POA: Diagnosis not present

## 2016-10-04 DIAGNOSIS — Y929 Unspecified place or not applicable: Secondary | ICD-10-CM | POA: Insufficient documentation

## 2016-10-04 DIAGNOSIS — Y999 Unspecified external cause status: Secondary | ICD-10-CM | POA: Diagnosis not present

## 2016-10-04 DIAGNOSIS — Y9301 Activity, walking, marching and hiking: Secondary | ICD-10-CM | POA: Diagnosis not present

## 2016-10-04 NOTE — ED Provider Notes (Signed)
[facility] DEPT Provider Note   CSN: [id number] Arrival date & time: 10/04/16  1317     History   Chief Complaint Chief Complaint  Patient presents with  . Fall    HPI Kathryn [name] is a 46 y.o. female who presents with bilateral knee pain that began 8 days ago after mechanical fall. Patient states that she was walking down her porch steps and when she got to the last step she tripped and fell to the ground on her bilateral knees. She reports that since then she has had progressively worsening pain. She still been able to injury and bear weight but with reports of pain. She has taken over-the-counter Tylenol with no improvement of symptoms. She reports some mild swelling. No history fevers, no redness or warmth to the knees, no ankle pain.  The history is provided by the patient.    Past Medical History:  Diagnosis Date  . Anemia   . Anxiety   . Bipolar disorder ([place])   . Depression   . Diabetes mellitus   . Hypertension   . Schizoaffective disorder [facility])     Patient Active Problem List   Diagnosis Date Noted  . GERD (gastroesophageal reflux disease) 10/08/2015  . IBS (irritable colon syndrome) 10/08/2015  . Agitation 09/07/2015  . Lithium toxicity 09/07/2015  . Altered mental status   . Elevated liver enzymes 09/06/2015  . AKI (acute kidney injury) ([place]) 09/06/2015  . Diabetes ([place]) 08/22/2015  . Essential hypertension 08/22/2015  . Schizoaffective disorder ([place]) 02/08/2015  . PTSD (post-traumatic stress disorder) 02/08/2015  . Acute low back pain due to trauma 09/21/2012    Past Surgical History:  Procedure Laterality Date  . BREAST SURGERY Right    biopsy  . CESAREAN SECTION    . CHOLECYSTECTOMY  06/02/2012   Procedure: LAPAROSCOPIC CHOLECYSTECTOMY;  Surgeon: Jamesetta So, MD;  Location: [facility] ORS;  Service: General;  Laterality: N/A;  Attempted laparoscopic cholecystectomy  . CHOLECYSTECTOMY  06/02/2012   Procedure: CHOLECYSTECTOMY;  Surgeon: Jamesetta So, MD;  Location: [facility] ORS;  Service: General;  Laterality: N/A;  converted to open at  0905  . ESOPHAGOGASTRODUODENOSCOPY (EGD) WITH PROPOFOL N/A 08/24/2015   Procedure: ESOPHAGOGASTRODUODENOSCOPY (EGD) WITH PROPOFOL;  Surgeon: Rogene Houston, MD;  Location: [facility] ENDO SUITE;  Service: Endoscopy;  Laterality: N/A;  1:10 - Ann to notify pt to arrive at 11:30  . TUBAL LIGATION     X3    OB History    Gravida Para Term Preterm AB Living   3 3 3     3    SAB TAB Ectopic Multiple Live Births                   Home Medications    Prior to Admission medications   Medication Sig Start Date End Date Taking? Authorizing Provider  albuterol (PROVENTIL HFA;VENTOLIN HFA) 108 (90 BASE) MCG/ACT inhaler Inhale 2 puffs into the lungs every 6 (six) hours as needed for wheezing. Reported on 10/03/2015    [provider]  aspirin EC 81 MG tablet Take 81 mg by mouth daily.    [provider]  atorvastatin (LIPITOR) 10 MG tablet Take 1 tablet by mouth daily. 05/04/15   [provider]  benztropine (COGENTIN) 1 MG tablet Take 1 tablet (1 mg total) by mouth at bedtime. 08/26/16 08/26/17  Cloria Spring, MD  BYDUREON BCISE 2 MG/0.85ML AUIJ  08/12/16   [provider]  CARAFATE 1 GM/10ML suspension Take  10 mLs (1 g total) by mouth 4 (four) times daily. 09/18/16   Setzer, Rona Ravens, NP  dicyclomine (BENTYL) 20 MG tablet Take 1 tablet (20 mg total) by mouth 2 (two) times daily. Patient not taking: Reported on 08/28/2016 07/23/16   Butch Penny, NP  DULoxetine (CYMBALTA) 60 MG capsule Take 1 capsule (60 mg total) by mouth daily at 6 PM. 08/26/16   Cloria Spring, MD  gabapentin (NEURONTIN) 300 MG capsule Take 300 mg by mouth 3 (three) times daily.  03/28/15   [provider]  insulin lispro (HUMALOG) 100 UNIT/ML injection Inject 15 Units into the skin 3 (three) times daily before meals.     [provider]  levothyroxine (SYNTHROID, LEVOTHROID) 25 MCG tablet Take 25 mcg  by mouth daily before breakfast.  04/15/15   [provider]  linaclotide (LINZESS) 290 MCG CAPS capsule Take 1 capsule (290 mcg total) by mouth daily before breakfast. Patient not taking: Reported on 08/28/2016 04/08/16   Rogene Houston, MD  lisinopril (PRINIVIL,ZESTRIL) 40 MG tablet Take 40 mg by mouth daily.    [provider]  omeprazole (PRILOSEC) 20 MG capsule Take 1 capsule (20 mg total) by mouth daily before supper. 04/08/16   [name], Mechele Dawley, MD  ondansetron (ZOFRAN) 4 MG tablet Take 1 tablet (4 mg total) by mouth every 6 (six) hours. 07/11/16   Long, Wonda Olds, MD  pioglitazone (ACTOS) 15 MG tablet Take 15 mg by mouth daily.    [provider]  ranitidine (ZANTAC) 150 MG tablet Take 1 tablet (150 mg total) by mouth at bedtime as needed for heartburn. 04/08/16   [name], Mechele Dawley, MD  risperiDONE (RISPERDAL) 2 MG tablet Take 2 tablets (4 mg total) by mouth at bedtime. 08/26/16 08/26/17  Cloria Spring, MD  sucralfate (CARAFATE) 1 GM/10ML suspension Take 10 mLs (1 g total) by mouth 4 (four) times daily. 07/28/16   Setzer, Rona Ravens, NP  TOUJEO SOLOSTAR 300 UNIT/ML SOPN Inject 20-70 Units into the skin at bedtime. 70 units in the morning and 25 units at bedtime 04/15/15   [provider]  traMADol (ULTRAM) 50 MG tablet Take 1 tablet (50 mg total) by mouth every 6 (six) hours as needed. 01/26/16   Triplett, Tammy, PA-C  traZODone (DESYREL) 100 MG tablet Take 2 tablets (200 mg total) by mouth at bedtime. 08/26/16   Cloria Spring, MD  Vitamin D, Ergocalciferol, (DRISDOL) 50000 units CAPS capsule Take 50,000 Units by mouth every Sunday.     [provider]    Family History Family History  Problem Relation Age of Onset  . Hypertension Mother   . Alcohol abuse Mother   . Hypertension Father   . Alcohol abuse Sister   . Stroke Other   . Diabetes Other   . Cancer Other   . Seizures Other   . Alcohol abuse Maternal Aunt   . Alcohol abuse Paternal Aunt   .  Alcohol abuse Maternal Grandfather   . Alcohol abuse Maternal Grandmother   . Alcohol abuse Cousin     Social History Social History  Substance Use Topics  . Smoking status: Never Smoker  . Smokeless tobacco: Never Used  . Alcohol use No     Allergies   Patient has no known allergies.   Review of Systems Review of Systems  Constitutional: Negative for fever.  Musculoskeletal: Positive for arthralgias.       +Bilateral knee pain  All other systems reviewed  and are negative.    Physical Exam Updated Vital Signs BP 136/82 (BP Location: Right Arm)   Pulse (!) 108   Temp 98.3 F (36.8 C) (Oral)   Resp 18   Ht 5' (1.524 m)   Wt 78.9 kg   LMP 01/23/2013   SpO2 99%   BMI 33.98 kg/m   Physical Exam  Constitutional: She appears well-developed and well-nourished.  HENT:  Head: Normocephalic and atraumatic.  Eyes: Conjunctivae and EOM are normal. Right eye exhibits no discharge. Left eye exhibits no discharge. No scleral icterus.  Pulmonary/Chest: Effort normal.  Musculoskeletal: She exhibits no deformity.  Right knee with diffuse tenderness to palpation. No deformity or crepitus felt. Left knee with diffuse tenderness to palpation. No deformity or crepitus felt. No surrounding warmth, edema, erythema to bilateral knees. Bilateral knees negative anterior and posterior drawer test. No instability on varus or valgus stress. Patient able to raise bilateral lower extremities in extension and hold them.   Neurological: She is alert.  5/5 strength BLE.   Skin: Skin is warm and dry.  No surrounding redness, warmth, edema overlying bilateral knees.  Psychiatric: She has a normal mood and affect. Her speech is normal and behavior is normal.  Nursing note and vitals reviewed.   ED Treatments / Results  Labs (all labs ordered are listed, but only abnormal results are displayed) Labs Reviewed - No data to display  EKG  EKG Interpretation None       Radiology Dg Knee  Complete 4 Views Left  Result Date: 10/04/2016 CLINICAL DATA:  Pain after fall 8 days ago. EXAM: LEFT KNEE - COMPLETE 4+ VIEW COMPARISON:  None. FINDINGS: Minimal degenerative changes in the medial compartment. No joint effusion. No acute fractures noted. No anterior soft tissue swelling. Calcifications behind the knee. The largest appears to be medially located on some of the AP and oblique images suggesting the possibility of a loose body. IMPRESSION: No acute abnormality.  No fracture, dislocation, or joint effusion. Electronically Signed   By: Dorise Bullion III M.D   On: 10/04/2016 14:54   Dg Knee Complete 4 Views Right  Result Date: 10/04/2016 CLINICAL DATA:  Pain after fall 8 days ago EXAM: RIGHT KNEE - COMPLETE 4+ VIEW COMPARISON:  None. FINDINGS: No evidence of fracture, dislocation, or joint effusion. No evidence of arthropathy or other focal bone abnormality. Soft tissues are unremarkable. IMPRESSION: Negative. Electronically Signed   By: Dorise Bullion III M.D   On: 10/04/2016 14:55    Procedures Procedures (including critical care time)  Medications Ordered in ED Medications - No data to display   Initial Impression / Assessment and Plan / ED Course  I have reviewed the triage vital signs and the nursing notes.  Pertinent labs & imaging results that were available during my care of the patient were reviewed by me and considered in my medical decision making (see chart for details).     46 year old female who presents with bilateral knee pain that has been going on for the last 8 days after a mechanical fall. Patient was walking down the steps of her front porch when she tripped and fell landing on bilateral knees. Has been able to ambulate and bear weight since incident but reports pain. She has taken Tylenol with no relief. History/physical exam showed diffuse tenderness palpation to bilateral knees. No obvious deformity or dislocation. Consider fracture versus sprain.  History/physical exam are not concerning thing for patellar tendon rupture or septic joint. Will obtain x-ray  of bilateral knees to evaluate for any fracture or dislocation.  X-rays reviewed. Right knee x-ray negative for any acute fracture or effusion. Left knee x-ray shows some K sessions and possible loose body. Symptoms likely result of knee sprain. We will plan to treat patient symptomatically. We'll apply a knee sleeve in the department for support and stabilization. Plan to provide orthopedic referral for outpatient follow-up with orthopedic. Strict return precautions discussed. Patient expresses understanding and agreement to plan.  Final Clinical Impressions(s) / ED Diagnoses   Final diagnoses:  Sprain of left knee, unspecified ligament, initial encounter  Sprain of right knee, unspecified ligament, initial encounter    New Prescriptions New Prescriptions   No medications on file     Desma Mcgregor 10/04/16 1542    Volanda Napoleon, PA-C 10/04/16 1550    Mesner, Corene Cornea, MD 10/05/16 [phone]

## 2016-10-04 NOTE — Discharge Instructions (Signed)
Used a knee sleeve for support and stabilization.  You may continue taking Tylenol as needed for pain.  You can apply ice to help with relief of pain.  Follow-up with the referred orthopedic doctor for further evaluation.  Return the emergency Department for any worsening pain, fever, swelling to the knee, redness to the knee or any other worsening or concerning symptoms.

## 2016-10-04 NOTE — ED Triage Notes (Signed)
PT states she was walking down 2 steps on her porch and tripped and went down onto both knees x8 days ago. PT ambulatory in triage and states no relief from a week use of ibuprofen.

## 2016-10-07 ENCOUNTER — Ambulatory Visit (INDEPENDENT_AMBULATORY_CARE_PROVIDER_SITE_OTHER): Payer: Medicare Other | Admitting: Internal Medicine

## 2016-10-07 ENCOUNTER — Encounter (INDEPENDENT_AMBULATORY_CARE_PROVIDER_SITE_OTHER): Payer: Self-pay | Admitting: Internal Medicine

## 2016-10-07 VITALS — BP 140/90 | HR 84 | Temp 98.6°F | Resp 18 | Ht 60.0 in | Wt 178.6 lb

## 2016-10-07 DIAGNOSIS — R1084 Generalized abdominal pain: Secondary | ICD-10-CM

## 2016-10-07 DIAGNOSIS — K580 Irritable bowel syndrome with diarrhea: Secondary | ICD-10-CM | POA: Diagnosis not present

## 2016-10-07 DIAGNOSIS — K219 Gastro-esophageal reflux disease without esophagitis: Secondary | ICD-10-CM | POA: Diagnosis not present

## 2016-10-07 MED ORDER — DICYCLOMINE HCL 10 MG PO CAPS: 10.0000 mg | ORAL_CAPSULE | Freq: Three times a day (TID) | ORAL | 2 refills | Status: DC

## 2016-10-07 NOTE — Progress Notes (Signed)
Presenting complaint;  Follow-up for GERD and IBS.  Subjective:  Patient is 46 year old African-American female who is here for scheduled visit. She has history of GERD and IBS. She also has history of elevated transaminases for which she was seen last year. Hepatic injury was felt to be due to lithium. She had toxic levels on admission. Her transaminases have returned to normal.  Patient complains of generalized abdominal pain which usually occurs within 30 minutes a meal and may last for 2 hours. She also complains of excessive gas and diarrhea and nausea. She has had heaving spells but no vomiting. She has at least 3 loose bowel movements daily. She denies melena or rectal bleeding. She has gained 13 pounds since her last visit of [place] 2018. She says she has quit drinking sodas. She is trying to become more active. She walks about a mile and a half every day. She feels heartburns well controlled with therapy and she denies dysphagia.   Current Medications: Outpatient Encounter Prescriptions as of 10/07/2016  Medication Sig  . albuterol (PROVENTIL HFA;VENTOLIN HFA) 108 (90 BASE) MCG/ACT inhaler Inhale 2 puffs into the lungs every 6 (six) hours as needed for wheezing. Reported on 10/03/2015  . aspirin EC 81 MG tablet Take 81 mg by mouth daily.  Marland Kitchen atorvastatin (LIPITOR) 10 MG tablet Take 1 tablet by mouth daily.  . benztropine (COGENTIN) 1 MG tablet Take 1 tablet (1 mg total) by mouth at bedtime.  Marland Kitchen BYDUREON BCISE 2 MG/0.85ML AUIJ Inject 2 mg into the skin. Once a week on Mondays  . CARAFATE 1 GM/10ML suspension Take 10 mLs (1 g total) by mouth 4 (four) times daily.  . DULoxetine (CYMBALTA) 60 MG capsule Take 1 capsule (60 mg total) by mouth daily at 6 PM.  . gabapentin (NEURONTIN) 300 MG capsule Take 300 mg by mouth 3 (three) times daily.   . insulin lispro (HUMALOG) 100 UNIT/ML injection Inject 15 Units into the skin 3 (three) times daily before meals.   Marland Kitchen levothyroxine (SYNTHROID,  LEVOTHROID) 25 MCG tablet Take 25 mcg by mouth daily before breakfast.   . lisinopril (PRINIVIL,ZESTRIL) 40 MG tablet Take 40 mg by mouth daily.  Marland Kitchen omeprazole (PRILOSEC) 20 MG capsule Take 1 capsule (20 mg total) by mouth daily before supper.  . ondansetron (ZOFRAN) 4 MG tablet Take 1 tablet (4 mg total) by mouth every 6 (six) hours.  . pioglitazone (ACTOS) 15 MG tablet Take 15 mg by mouth daily.  . ranitidine (ZANTAC) 150 MG tablet Take 1 tablet (150 mg total) by mouth at bedtime as needed for heartburn.  . risperiDONE (RISPERDAL) 2 MG tablet Take 2 tablets (4 mg total) by mouth at bedtime.  [name] SOLOSTAR 300 UNIT/ML SOPN Inject 20-70 Units into the skin at bedtime. 70 units in the morning and 25 units at bedtime  . traMADol (ULTRAM) 50 MG tablet Take 1 tablet (50 mg total) by mouth every 6 (six) hours as needed.  . traZODone (DESYREL) 100 MG tablet Take 2 tablets (200 mg total) by mouth at bedtime.  . Vitamin D, Ergocalciferol, (DRISDOL) 50000 units CAPS capsule Take 50,000 Units by mouth every Sunday.   . dicyclomine (BENTYL) 20 MG tablet Take 1 tablet (20 mg total) by mouth 2 (two) times daily. (Patient not taking: Reported on 08/28/2016)  . [DISCONTINUED] linaclotide (LINZESS) 290 MCG CAPS capsule Take 1 capsule (290 mcg total) by mouth daily before breakfast. (Patient not taking: Reported on 08/28/2016)  . [DISCONTINUED] sucralfate (CARAFATE) 1 GM/10ML  suspension Take 10 mLs (1 g total) by mouth 4 (four) times daily. (Patient not taking: Reported on 10/07/2016)   No facility-administered encounter medications on file as of 10/07/2016.      Objective: Blood pressure 140/90, pulse 84, temperature 98.6 F (37 C), temperature source Oral, resp. rate 18, height 5' (1.524 m), weight 178 lb 9.6 oz (81 kg), last menstrual period 01/23/2013. Patient is alert and in no acute distress. Conjunctiva is pink. Sclera is nonicteric Oropharyngeal mucosa is normal. No neck masses or thyromegaly  noted. Cardiac exam with regular rhythm normal S1 and S2. No murmur or gallop noted. Lungs are clear to auscultation. Abdomen is full. Bowel sounds are normal. She has mild generalized tenderness both in superficial and deep palpation. No organomegaly or masses.  No LE edema or clubbing noted.  Labs/studies Results: LFTs from 07/11/2016  Bilirubin 1.3, AP 102, AST 21, ALT 18, albumin 4.3.    Assessment:  #1. Postprandial abdominal pain. This pain is possibly due to IBS but she may also have small intestinal bacterial overgrowth. Some of her pain appears to be superficial. If she does not respond to therapy will consider further workup. He also needs to decrease intake of flatulogenic foods. #2. IBS. Actos may be contribution to her symptoms. #3. GERD. Heartburn is well controlled with therapy.   Plan:  Dicyclomine 10 mg before each meal but at least 2 times a day. Patient was given list of gas-forming foods that she should avoid or eat less. She will talk with Dr. Maudie Mercury if she could come off Actos. Patient will call with progress report in one week. If she does not feel better with proceed with hydrogen breath test. Office visit in 6 months.

## 2016-10-07 NOTE — Patient Instructions (Addendum)
Call with progress report in one month. Please check with Dr. Maudie Mercury if you could come off Actos. Must exercise at least 5 times a week if not daily.

## 2016-10-14 ENCOUNTER — Telehealth (HOSPITAL_COMMUNITY): Payer: Self-pay | Admitting: *Deleted

## 2016-10-14 ENCOUNTER — Other Ambulatory Visit: Payer: Self-pay

## 2016-10-14 NOTE — Telephone Encounter (Signed)
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 She will need to come in to discuss

## 2016-10-14 NOTE — Telephone Encounter (Signed)
Pt called stating she would like a letter from Dr. Harrington Challenger stating what she is diagnosed with so she can take it to her school to get more time on test and exams. Pt phone number is [phone].

## 2016-10-14 NOTE — Patient Outreach (Signed)
[facility]) Care Management  10/14/2016  [name] A Macek 04-04-1971 [id number]   Follow up phone call to patient about her eligibility with [facility]. Patient reports she went to the ED a couple of weeks ago due to a fall and she hurt her knee.  Patient reports she has an appointment scheduled with the orthopedist as she may have injury some muscles and ligaments.  Patient reports that she is being careful not to fall and pain is doing ok.  Patient most recent insurance card was loaded in the system.  Notified patient that she is eligible.  Patient verbalized understanding.    RN Health Coach will contact patient in the month of June and patient agrees to next outreach.'

## 2016-10-15 NOTE — Telephone Encounter (Signed)
lmtcb and office number provided 

## 2016-10-28 ENCOUNTER — Encounter (HOSPITAL_COMMUNITY): Payer: Self-pay | Admitting: Psychiatry

## 2016-10-28 ENCOUNTER — Ambulatory Visit (INDEPENDENT_AMBULATORY_CARE_PROVIDER_SITE_OTHER): Payer: 59 | Admitting: Psychiatry

## 2016-10-28 VITALS — BP 129/86 | HR 113 | Ht 60.0 in | Wt 178.4 lb

## 2016-10-28 DIAGNOSIS — Z7982 Long term (current) use of aspirin: Secondary | ICD-10-CM

## 2016-10-28 DIAGNOSIS — F4310 Post-traumatic stress disorder, unspecified: Secondary | ICD-10-CM

## 2016-10-28 DIAGNOSIS — F251 Schizoaffective disorder, depressive type: Secondary | ICD-10-CM

## 2016-10-28 DIAGNOSIS — Z79899 Other long term (current) drug therapy: Secondary | ICD-10-CM | POA: Diagnosis not present

## 2016-10-28 DIAGNOSIS — Z811 Family history of alcohol abuse and dependence: Secondary | ICD-10-CM | POA: Diagnosis not present

## 2016-10-28 MED ORDER — TRAZODONE HCL 100 MG PO TABS: 200.0000 mg | ORAL_TABLET | Freq: Every day | ORAL | 2 refills | Status: DC

## 2016-10-28 MED ORDER — RISPERIDONE 2 MG PO TABS: 4.0000 mg | ORAL_TABLET | Freq: Every day | ORAL | 2 refills | Status: DC

## 2016-10-28 MED ORDER — BENZTROPINE MESYLATE 1 MG PO TABS
1.0000 mg | ORAL_TABLET | Freq: Every day | ORAL | 2 refills | Status: DC
Start: 2016-10-28 — End: 2016-12-24

## 2016-10-28 MED ORDER — DULOXETINE HCL 60 MG PO CPEP: 60.0000 mg | ORAL_CAPSULE | Freq: Every day | ORAL | 2 refills | Status: DC
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
## 2016-11-03 ENCOUNTER — Other Ambulatory Visit: Payer: Self-pay | Admitting: Licensed Clinical Social Worker

## 2016-11-03 NOTE — Patient Outreach (Signed)
Assessment:  CSW spoke via phone with client. CSW verified client identity. CSW received verbal permission from client on 11/03/16 for CSW to communicate with client about current client needs. Client sees Dr. [name], as primary care doctor. Client said she had her prescribed medications and is taking medications as prescribed. CSW and client spoke of client care plan. CSW encouraged client to communicate with CSW in next 30 days to discuss affordable housing options for client in the area. Client reported to [place] that client's spouse is transporting client to and from client's medical and mental health appointments.  Client is receiving telephonic support with [place], [name], RN.  Client sees Dr. Harrington Challenger, psychiatrist, as scheduled, for mental health needs of client. Client said she is eating well and sleeping adequately. She said that she and her brother are communicating regarding the current care needs of her mother, who lives in [place], [place].  Client said she had appointment recently with Dr. Harrington Challenger, psychiatrist. Client said she is scheduled for a medical appointment with Dr. Maudie Mercury this month.  Client said she has family support. She said she enjoys spending time with her grandchildren.  She said she appreciates the telephonic support she receives from [employer], [name] Merchandiser, retail. CSW spoke with client about [facility] support in areas of nursing, social work and pharmacy. CSW encouraged client to call CSW at [phone] as needed to discuss social work needs of client. Client was appreciative of phone call from [place] on 11/03/16.  Plan:  Client to communicate with CSW in next 30 days to discuss affordable housing options for client in the area.  CSW to collaborate with [name], [place], in monitoring needs of client.  CSW to call client in 4 weeks to assess client needs.   Norva Riffle.Lori Liew MSW, LCSW Licensed Clinical Social Worker [facility] Care Management [phone]

## 2016-11-13 ENCOUNTER — Encounter: Payer: Self-pay | Admitting: Licensed Clinical Social Worker

## 2016-11-13 ENCOUNTER — Other Ambulatory Visit: Payer: Self-pay | Admitting: Licensed Clinical Social Worker

## 2016-11-13 NOTE — Patient Outreach (Signed)
Assessment :  CSW spoke via phone with Kathryn [name] on 11/13/16. CSW verified identity of Kathryn [name]. Kathryn and CSW spoke of client needs. Client has prescribed medications and is taking medications as prescribed. She attends medical appointments as scheduled with her primary care doctor. Her spouse transports Kathryn to and from her medical and mental health appointments. She does not speak of any current pain issues. She is eating well and sleeping well. She has met her care plan goals with [facility] CSW services. Client is still receiving support from [place], [place] RN. CSW informed Kathryn on 11/13/16 that client had met her care plan goals with [facility] CSW services. Thus, because client had met care plan goals with CSW services, CSW informed Kathryn on 11/13/16 that [place] would discharge client on 11/13/16 from [place] services only. Client agreed to this plan. Client will continue to receive support from [place], [place] RN.  [place] on meeting her care plan goals with CSW services.  CSW encouraged Kathryn to communicate with [name] RN as needed to discuss nursing needs of client. Client was appreciative of [facility] CSW support she had received.   Plan:  CSW is discharging Kathryn [name] from [facility] CSW services on 11/13/16 since client has met her care plan goals with CSW services.  CSW to inform [facility], Case Management Assistant, that [place] discharged client from [place] services only on 11/13/16.    CSW to fax letter to Dr. [name] informing Dr. Maudie Mercury that Kathryn [name] discharged client on 11/13/16 from [place] services only.  Norva Riffle.Kathryn [name] MSW, LCSW Licensed Clinical Social Worker [facility] Care Management [phone]

## 2016-11-14 ENCOUNTER — Other Ambulatory Visit: Payer: Self-pay

## 2016-11-14 NOTE — Patient Outreach (Addendum)
[facility]) Care Management  [name]  11/14/2016   Kathryn [name] 1970/11/05 [id number]  Subjective: Telephone call to patient for monthly call.  Patient reports she is doing ok.  Patient reports he knee is ok from fall injury on last conversation.  Patient reports some back pain when she is up but denies any presently.  Patient reports that she saw her primary doctor on Monday.  She reports that her A1c was up to 10.5 but the doctor wants to watch it. Patient did not give any blood sugar readings.  Discussed with patient importance of doing her part to help her sugars by watching carbohydrates, sweets, and maintaining activity. She verbalized understanding. Patient unable to review medications on call as she is doing laundry.  Will attempt medication review on next call.  Patient shares she will be moving closer to her mother next month and looks forward to moving by herself.  Encouraged patient to take time for herself and self reflection.  She verbalized understanding.    Objective:   Encounter Medications:  Outpatient Encounter Prescriptions as of 11/14/2016  Medication Sig Note  . albuterol (PROVENTIL HFA;VENTOLIN HFA) 108 (90 BASE) MCG/ACT inhaler Inhale 2 puffs into the lungs every 6 (six) hours as needed for wheezing. Reported on 10/03/2015   . aspirin EC 81 MG tablet Take 81 mg by mouth daily.   Marland Kitchen atorvastatin (LIPITOR) 10 MG tablet Take 1 tablet by mouth daily. 08/28/2016: Patient taking  . benztropine (COGENTIN) 1 MG tablet Take 1 tablet (1 mg total) by mouth at bedtime.   Marland Kitchen BYDUREON BCISE 2 MG/0.85ML AUIJ Inject 2 mg into the skin. Once a week on Mondays   . dicyclomine (BENTYL) 10 MG capsule Take 1 capsule (10 mg total) by mouth 3 (three) times daily before meals.   . DULoxetine (CYMBALTA) 60 MG capsule Take 1 capsule (60 mg total) by mouth daily at 6 PM.   . gabapentin (NEURONTIN) 300 MG capsule Take 300 mg by mouth 3 (three) times daily.    . insulin  lispro (HUMALOG) 100 UNIT/ML injection Inject 15 Units into the skin 3 (three) times daily before meals.    Marland Kitchen levothyroxine (SYNTHROID, LEVOTHROID) 25 MCG tablet Take 25 mcg by mouth daily before breakfast.    . lisinopril (PRINIVIL,ZESTRIL) 40 MG tablet Take 40 mg by mouth daily.   Marland Kitchen omeprazole (PRILOSEC) 20 MG capsule Take 1 capsule (20 mg total) by mouth daily before supper.   . ondansetron (ZOFRAN) 4 MG tablet Take 1 tablet (4 mg total) by mouth every 6 (six) hours.   . pioglitazone (ACTOS) 15 MG tablet Take 15 mg by mouth daily.   . ranitidine (ZANTAC) 150 MG tablet Take 1 tablet (150 mg total) by mouth at bedtime as needed for heartburn.   . risperiDONE (RISPERDAL) 2 MG tablet Take 2 tablets (4 mg total) by mouth at bedtime.   [name] SOLOSTAR 300 UNIT/ML SOPN Inject 20-70 Units into the skin at bedtime. 70 units in the morning and 25 units at bedtime 10/07/2016: In the morning 70 units , 55 units at night.  . traMADol (ULTRAM) 50 MG tablet Take 1 tablet (50 mg total) by mouth every 6 (six) hours as needed.   . traZODone (DESYREL) 100 MG tablet Take 2 tablets (200 mg total) by mouth at bedtime.   . Vitamin D, Ergocalciferol, (DRISDOL) 50000 units CAPS capsule Take 50,000 Units by mouth every Sunday.     No facility-administered encounter medications  on file as of 11/14/2016.     Functional Status:  In your present state of health, do you have any difficulty performing the following activities: 01/02/2016  Hearing? N  Vision? N  Difficulty concentrating or making decisions? N  Walking or climbing stairs? N  Dressing or bathing? N  Doing errands, shopping? N  Preparing Food and eating ? N  Using the Toilet? N  In the past six months, have you accidently leaked urine? N  Do you have problems with loss of bowel control? N  Managing your Medications? N  Managing your Finances? N  Housekeeping or managing your Housekeeping? N  Some recent data might be hidden    Fall/Depression  Screening: Fall Risk  11/14/2016 09/23/2016 08/28/2016  Falls in the past year? No No No  Risk for fall due to : - - -   PHQ 2/9 Scores 11/14/2016 09/23/2016 08/28/2016 07/28/2016 06/30/2016 06/02/2016 04/28/2016  PHQ - 2 Score 1 0 0 0 0 0 0  PHQ- 9 Score - - - - - - -    Assessment: Patient continues to benefit from health coach outreach for disease management and support.    Plan:  [facility] CM Care Plan Problem One     Most Recent Value  Care Plan Problem One  Recent fall  Role Documenting the Problem One  [facility] [place] for Problem One  Active  THN Long Term Goal   Patient will report no falls within the next 31 days.  THN Long Term Goal Start Date  10/14/16  THN Long Term Goal Met Date  11/14/16  Interventions for Problem One Long Term Goal  no falls    THN CM Care Plan Problem Two     Most Recent Value  Care Plan Problem Two  knowledge deficit related to diabetes as evidenced by questions around diabetes from patient  Role Documenting the Problem Two  [place] for Problem Two  Active  Interventions for Problem Two [place] reviewed with patient blood sugar readings, importance of diet, meal planning, and exercise in decreasing sugars.    THN Long Term Goal  Patient will have knowledge of diabetic diet over the next 90 days  THN Long Term Goal Start Date  11/14/16 [facility] continued]     [place] will contact patient in the month of July and patient agrees to next outreach.  Jone Baseman, RN, MSN [place] [phone]

## 2016-11-21 ENCOUNTER — Ambulatory Visit (HOSPITAL_COMMUNITY): Payer: Self-pay | Admitting: Psychiatry

## 2016-12-02 ENCOUNTER — Other Ambulatory Visit (HOSPITAL_COMMUNITY): Payer: Self-pay | Admitting: Internal Medicine

## 2016-12-02 DIAGNOSIS — Z1231 Encounter for screening mammogram for malignant neoplasm of breast: Secondary | ICD-10-CM

## 2016-12-04 ENCOUNTER — Ambulatory Visit: Payer: Self-pay | Admitting: Licensed Clinical Social Worker

## 2016-12-12 ENCOUNTER — Other Ambulatory Visit: Payer: Self-pay

## 2016-12-12 NOTE — Patient Outreach (Signed)
[facility]) Care Management  [place]  12/12/2016   Kathryn [name] 1971/03/20 [id number]  Subjective: Telephone call to patient for monthly call.  Patient reports she is doing well.  She just moved and gave new address.  Patient reports she feels safe and that she has not had problems with depression in the last week.  She states she is closer to her mom now and that she talks with her sister is [place] often now so that has helped.  Patient reports that her last sugar was 206 but attributes that to her move.  Discussed with patient getting settled into as routine and limiting sweets and carbohydrates to lower her sugar. She verbalized understanding.   Objective:   Encounter Medications:  Outpatient Encounter Prescriptions as of 12/12/2016  Medication Sig Note  . albuterol (PROVENTIL HFA;VENTOLIN HFA) 108 (90 BASE) MCG/ACT inhaler Inhale 2 puffs into the lungs every 6 (six) hours as needed for wheezing. Reported on 10/03/2015   . aspirin EC 81 MG tablet Take 81 mg by mouth daily.   Marland Kitchen atorvastatin (LIPITOR) 10 MG tablet Take 1 tablet by mouth daily. 08/28/2016: Patient taking  . benztropine (COGENTIN) 1 MG tablet Take 1 tablet (1 mg total) by mouth at bedtime.   Marland Kitchen BYDUREON BCISE 2 MG/0.85ML AUIJ Inject 2 mg into the skin. Once a week on Mondays   . dicyclomine (BENTYL) 10 MG capsule Take 1 capsule (10 mg total) by mouth 3 (three) times daily before meals.   . DULoxetine (CYMBALTA) 60 MG capsule Take 1 capsule (60 mg total) by mouth daily at 6 PM.   . gabapentin (NEURONTIN) 300 MG capsule Take 300 mg by mouth 3 (three) times daily.    . insulin lispro (HUMALOG) 100 UNIT/ML injection Inject 15 Units into the skin 3 (three) times daily before meals.    Marland Kitchen levothyroxine (SYNTHROID, LEVOTHROID) 25 MCG tablet Take 25 mcg by mouth daily before breakfast.    . lisinopril (PRINIVIL,ZESTRIL) 40 MG tablet Take 40 mg by mouth daily.   Marland Kitchen omeprazole (PRILOSEC) 20 MG capsule Take 1  capsule (20 mg total) by mouth daily before supper.   . ondansetron (ZOFRAN) 4 MG tablet Take 1 tablet (4 mg total) by mouth every 6 (six) hours.   . pioglitazone (ACTOS) 15 MG tablet Take 15 mg by mouth daily.   . ranitidine (ZANTAC) 150 MG tablet Take 1 tablet (150 mg total) by mouth at bedtime as needed for heartburn.   . risperiDONE (RISPERDAL) 2 MG tablet Take 2 tablets (4 mg total) by mouth at bedtime.   [name] SOLOSTAR 300 UNIT/ML SOPN Inject 20-70 Units into the skin at bedtime. 70 units in the morning and 25 units at bedtime 10/07/2016: In the morning 70 units , 55 units at night.  . traMADol (ULTRAM) 50 MG tablet Take 1 tablet (50 mg total) by mouth every 6 (six) hours as needed.   . traZODone (DESYREL) 100 MG tablet Take 2 tablets (200 mg total) by mouth at bedtime.   . Vitamin D, Ergocalciferol, (DRISDOL) 50000 units CAPS capsule Take 50,000 Units by mouth every Sunday.     No facility-administered encounter medications on file as of 12/12/2016.     Functional Status:  In your present state of health, do you have any difficulty performing the following activities: 01/02/2016  Hearing? N  Vision? N  Difficulty concentrating or making decisions? N  Walking or climbing stairs? N  Dressing or bathing? N  Doing  errands, shopping? N  Preparing Food and eating ? N  Using the Toilet? N  In the past six months, have you accidently leaked urine? N  Do you have problems with loss of bowel control? N  Managing your Medications? N  Managing your Finances? N  Housekeeping or managing your Housekeeping? N  Some recent data might be hidden    Fall/Depression Screening: Fall Risk  12/12/2016 11/14/2016 09/23/2016  Falls in the past year? No No No  Risk for fall due to : - - -   PHQ 2/9 Scores 12/12/2016 11/14/2016 09/23/2016 08/28/2016 07/28/2016 06/30/2016 06/02/2016  PHQ - 2 Score 1 1 0 0 0 0 0  PHQ- 9 Score - - - - - - -    Assessment: Patient continues to benefit from health coach outreach for  disease management and support.    Plan:  [facility] CM Care Plan Problem Two     Most Recent Value  Care Plan Problem Two  knowledge deficit related to diabetes as evidenced by questions around diabetes from patient  Role Documenting the Problem Two  [place] for Problem Two  Active  Interventions for Problem Two Long Term Goal   RN Health Coach reiterated with patient blood sugar readings, importance of diet, meal planning, and exercise in decreasing sugars.    THN Long Term Goal  Patient will have knowledge of diabetic diet over the next 90 days  THN Long Term Goal Start Date  12/12/16 [facility] continued]     [place] will contact patient in the month of August and patient agrees to next outreach.  Jone Baseman, RN, MSN [place] [phone]

## 2016-12-18 ENCOUNTER — Ambulatory Visit (INDEPENDENT_AMBULATORY_CARE_PROVIDER_SITE_OTHER): Payer: Medicare Other | Admitting: Otolaryngology

## 2016-12-18 DIAGNOSIS — H903 Sensorineural hearing loss, bilateral: Secondary | ICD-10-CM | POA: Diagnosis not present

## 2016-12-22 ENCOUNTER — Ambulatory Visit (HOSPITAL_COMMUNITY)
Admission: RE | Admit: 2016-12-22 | Discharge: 2016-12-22 | Disposition: A | Discharge status: Home / Self Care | Payer: Medicare Other | Source: Ambulatory Visit | Attending: Internal Medicine | Admitting: Internal Medicine

## 2016-12-22 DIAGNOSIS — Z1231 Encounter for screening mammogram for malignant neoplasm of breast: Secondary | ICD-10-CM | POA: Insufficient documentation

## 2016-12-24 ENCOUNTER — Encounter (HOSPITAL_COMMUNITY): Payer: Self-pay | Admitting: Psychiatry

## 2016-12-24 ENCOUNTER — Ambulatory Visit (INDEPENDENT_AMBULATORY_CARE_PROVIDER_SITE_OTHER): Payer: 59 | Admitting: Psychiatry

## 2016-12-24 VITALS — BP 133/100 | HR 96 | Ht 60.0 in | Wt 176.6 lb

## 2016-12-24 DIAGNOSIS — Z811 Family history of alcohol abuse and dependence: Secondary | ICD-10-CM

## 2016-12-24 DIAGNOSIS — F251 Schizoaffective disorder, depressive type: Secondary | ICD-10-CM

## 2016-12-24 DIAGNOSIS — F4310 Post-traumatic stress disorder, unspecified: Secondary | ICD-10-CM | POA: Diagnosis not present

## 2016-12-24 DIAGNOSIS — G4700 Insomnia, unspecified: Secondary | ICD-10-CM | POA: Diagnosis not present

## 2016-12-24 DIAGNOSIS — R443 Hallucinations, unspecified: Secondary | ICD-10-CM | POA: Diagnosis not present

## 2016-12-24 MED ORDER — TRAZODONE HCL 100 MG PO TABS: 200.0000 mg | ORAL_TABLET | Freq: Every day | ORAL | 2 refills | Status: DC

## 2016-12-24 MED ORDER — RISPERIDONE 2 MG PO TABS: 4.0000 mg | ORAL_TABLET | Freq: Every day | ORAL | 2 refills | Status: DC

## 2016-12-24 MED ORDER — DULOXETINE HCL 60 MG PO CPEP: 60.0000 mg | ORAL_CAPSULE | Freq: Every day | ORAL | 2 refills | Status: DC

## 2016-12-24 MED ORDER — BENZTROPINE MESYLATE 1 MG PO TABS: 1.0000 mg | ORAL_TABLET | Freq: Every day | ORAL | 2 refills | Status: DC
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
## 2017-01-07 ENCOUNTER — Other Ambulatory Visit: Payer: Self-pay

## 2017-01-07 NOTE — Patient Outreach (Signed)
[facility]) Care Management  [place]  01/07/2017   Kathryn [name] Mar 11, 1971 [id number]  Subjective: Telephone call to patient for monthly call.  Patient reports she is doing better.  She reports that her sugars are lower.  She reports that her last sugar was 167.  Encouraged patient to continue her regimen.  Patient reports that she has had some instances where she hears voices but reports that she talked with Dr. Harrington Challenger about it and that she has gotten through those by talking with others and now is no longer a problem.  Encouraged patient to continue to find strategies that help her cope.  She verbalized understanding.   Objective:   Encounter Medications:  Outpatient Encounter Prescriptions as of 01/07/2017  Medication Sig Note  . albuterol (PROVENTIL HFA;VENTOLIN HFA) 108 (90 BASE) MCG/ACT inhaler Inhale 2 puffs into the lungs every 6 (six) hours as needed for wheezing. Reported on 10/03/2015   . aspirin EC 81 MG tablet Take 81 mg by mouth daily.   Marland Kitchen atorvastatin (LIPITOR) 10 MG tablet Take 1 tablet by mouth daily. 08/28/2016: Patient taking  . benztropine (COGENTIN) 1 MG tablet Take 1 tablet (1 mg total) by mouth at bedtime.   Marland Kitchen BYDUREON BCISE 2 MG/0.85ML AUIJ Inject 2 mg into the skin. Once a week on Mondays   . CARVEDILOL PO Take by mouth.   . dicyclomine (BENTYL) 10 MG capsule Take 1 capsule (10 mg total) by mouth 3 (three) times daily before meals.   . DULoxetine (CYMBALTA) 60 MG capsule Take 1 capsule (60 mg total) by mouth daily at 6 PM.   . gabapentin (NEURONTIN) 300 MG capsule Take 300 mg by mouth 3 (three) times daily.    . Insulin Glargine (TOUJEO MAX SOLOSTAR [place]) Inject 125 Units into the skin daily.   . insulin lispro (HUMALOG) 100 UNIT/ML injection Inject 15 Units into the skin 3 (three) times daily before meals.    Marland Kitchen levothyroxine (SYNTHROID, LEVOTHROID) 25 MCG tablet Take 25 mcg by mouth daily before breakfast.    . omeprazole (PRILOSEC) 20 MG  capsule Take 1 capsule (20 mg total) by mouth daily before supper.   . ondansetron (ZOFRAN) 4 MG tablet Take 1 tablet (4 mg total) by mouth every 6 (six) hours.   . pioglitazone (ACTOS) 15 MG tablet Take 15 mg by mouth daily.   . ranitidine (ZANTAC) 150 MG tablet Take 1 tablet (150 mg total) by mouth at bedtime as needed for heartburn.   . risperiDONE (RISPERDAL) 2 MG tablet Take 2 tablets (4 mg total) by mouth at bedtime.   . traMADol (ULTRAM) 50 MG tablet Take 1 tablet (50 mg total) by mouth every 6 (six) hours as needed.   . traZODone (DESYREL) 100 MG tablet Take 2 tablets (200 mg total) by mouth at bedtime.   . Vitamin D, Ergocalciferol, (DRISDOL) 50000 units CAPS capsule Take 50,000 Units by mouth every Sunday.     No facility-administered encounter medications on file as of 01/07/2017.     Functional Status:  No flowsheet data found.  Fall/Depression Screening: Fall Risk  01/07/2017 12/12/2016 11/14/2016  Falls in the past year? No No No  Risk for fall due to : - - -   PHQ 2/9 Scores 01/07/2017 12/12/2016 11/14/2016 09/23/2016 08/28/2016 07/28/2016 06/30/2016  PHQ - 2 Score 1 1 1  0 0 0 0  PHQ- 9 Score - - - - - - -    Assessment: Patient continues to benefit  from health coach outreach for disease management and support.     Plan:   [facility] CM Care Plan Problem Two     Most Recent Value  Care Plan Problem Two  knowledge deficit related to diabetes as evidenced by questions around diabetes from patient  Role Documenting the Problem Two  [place] for Problem Two  Active  Interventions for Problem Two Long Term Goal   RN Health Coach reviewed with patient blood sugar readings, importance of diet, meal planning, and exercise in decreasing sugars.    THN Long Term Goal  Patient will have knowledge of diabetic diet over the next 90 days  THN Long Term Goal Start Date  01/07/17 [facility] continued]     [place] will contact patient in the month of September and patient agrees to  next outreach.  Jone Baseman, RN, MSN [place] [phone]

## 2017-02-04 ENCOUNTER — Other Ambulatory Visit: Payer: Self-pay

## 2017-02-04 NOTE — Patient Outreach (Signed)
[facility]) Care Management  [place]  02/04/2017   [name] A Strauch Dec 07, 1970 [id number]  Subjective: Telephone call to patient for monthly call.  Patient reports she is doing ok. She reports that her sugars are in the 100's.  Her last sugar was 138.  Patient reports some issues with stomach and blood pressure and has seen her primary care physician and will go for follow up in two weeks.  Reviewed diabetic diet with patient.  She verbalized understanding.    Objective:   Encounter Medications:  Outpatient Encounter Prescriptions as of 02/04/2017  Medication Sig Note  . albuterol (PROVENTIL HFA;VENTOLIN HFA) 108 (90 BASE) MCG/ACT inhaler Inhale 2 puffs into the lungs every 6 (six) hours as needed for wheezing. Reported on 10/03/2015   . aspirin EC 81 MG tablet Take 81 mg by mouth daily.   Marland Kitchen atorvastatin (LIPITOR) 10 MG tablet Take 1 tablet by mouth daily. 08/28/2016: Patient taking  . benztropine (COGENTIN) 1 MG tablet Take 1 tablet (1 mg total) by mouth at bedtime.   Marland Kitchen BYDUREON BCISE 2 MG/0.85ML AUIJ Inject 2 mg into the skin. Once a week on Mondays   . CARVEDILOL PO Take by mouth.   . dicyclomine (BENTYL) 10 MG capsule Take 1 capsule (10 mg total) by mouth 3 (three) times daily before meals.   . DULoxetine (CYMBALTA) 60 MG capsule Take 1 capsule (60 mg total) by mouth daily at 6 PM.   . gabapentin (NEURONTIN) 300 MG capsule Take 300 mg by mouth 3 (three) times daily.    . Insulin Glargine (TOUJEO MAX SOLOSTAR [place]) Inject 125 Units into the skin daily.   . insulin lispro (HUMALOG) 100 UNIT/ML injection Inject 15 Units into the skin 3 (three) times daily before meals.    Marland Kitchen levothyroxine (SYNTHROID, LEVOTHROID) 25 MCG tablet Take 25 mcg by mouth daily before breakfast.    . omeprazole (PRILOSEC) 20 MG capsule Take 1 capsule (20 mg total) by mouth daily before supper.   . ondansetron (ZOFRAN) 4 MG tablet Take 1 tablet (4 mg total) by mouth every 6 (six) hours.   .  pioglitazone (ACTOS) 15 MG tablet Take 15 mg by mouth daily.   . ranitidine (ZANTAC) 150 MG tablet Take 1 tablet (150 mg total) by mouth at bedtime as needed for heartburn.   . risperiDONE (RISPERDAL) 2 MG tablet Take 2 tablets (4 mg total) by mouth at bedtime.   . traMADol (ULTRAM) 50 MG tablet Take 1 tablet (50 mg total) by mouth every 6 (six) hours as needed.   . traZODone (DESYREL) 100 MG tablet Take 2 tablets (200 mg total) by mouth at bedtime.   . Vitamin D, Ergocalciferol, (DRISDOL) 50000 units CAPS capsule Take 50,000 Units by mouth every Sunday.     No facility-administered encounter medications on file as of 02/04/2017.     Functional Status:  No flowsheet data found.  Fall/Depression Screening: Fall Risk  02/04/2017 01/07/2017 12/12/2016  Falls in the past year? No No No  Risk for fall due to : - - -   PHQ 2/9 Scores 02/04/2017 01/07/2017 12/12/2016 11/14/2016 09/23/2016 08/28/2016 07/28/2016  PHQ - 2 Score 0 1 1 1  0 0 0  PHQ- 9 Score - - - - - - -    Assessment: Patient continues to benefit from health coach outreach for disease management and support.     Plan:  [facility] CM Care Plan Problem Two     Most Recent Value  Care Plan Problem Two  knowledge deficit related to diabetes as evidenced by questions around diabetes from patient  Role Documenting the Problem Two  [place] for Problem Two  Active  Interventions for Problem Two Long Term Goal   RN Health Coach reiterated with patient blood sugar readings, importance of diet, meal planning, and exercise in decreasing sugars.    THN Long Term Goal  Patient will have knowledge of diabetic diet over the next 90 days  THN Long Term Goal Start Date  02/04/17 [facility] continued]     [place] will contact patient in the month of October and patient agrees to next outreach.  Jone Baseman, RN, MSN [place] [phone]

## 2017-02-06 ENCOUNTER — Ambulatory Visit: Payer: Self-pay | Admitting: Cardiology

## 2017-02-10 ENCOUNTER — Ambulatory Visit (INDEPENDENT_AMBULATORY_CARE_PROVIDER_SITE_OTHER): Payer: Medicare Other | Admitting: Cardiology

## 2017-02-10 ENCOUNTER — Encounter: Payer: Self-pay | Admitting: Cardiology

## 2017-02-10 VITALS — BP 158/94 | HR 102 | Ht 61.0 in | Wt 176.0 lb

## 2017-02-10 DIAGNOSIS — I10 Essential (primary) hypertension: Secondary | ICD-10-CM | POA: Diagnosis not present

## 2017-02-10 DIAGNOSIS — R079 Chest pain, unspecified: Secondary | ICD-10-CM | POA: Diagnosis not present

## 2017-02-10 NOTE — Patient Instructions (Signed)
Your physician recommends that you schedule a follow-up appointment in: [place] recommends that you continue on your current medications as directed. Please refer to the Current Medication list given to you today.  Your physician has requested that you have an echocardiogram. Echocardiography is a painless test that uses sound waves to create images of your heart. It provides your doctor with information about the size and shape of your heart and how well your heart's chambers and valves are working. This procedure takes approximately one hour. There are no restrictions for this procedure.  WE WILL SCHEDULE YOU A NURSE VISIT IN [place] OFFICE FOR THIS AFTERNOON TO REVIEW YOUR MEDICATIONS - PLEASE MAKE SURE YOU BRING ALL YOUR CURRENT MEDICATIONS WITH YOU  Thank you for choosing [place]!!

## 2017-02-10 NOTE — Progress Notes (Signed)
Clinical Summary Kathryn [name] is a 46 y.o.female seen as new consult, referred by Dr Maudie Mercury for chest pain  1. Chest pain - started about 1 month ago. Sharp/pressure like pain left side, 10/10 in severity. Can occur at rest or with exertion. Can feel SOB, hot, sweaty, nauseous. Lasts 5-10 seconds, occurs daily. Can be worst with position - +DOE which is new, example doing laundry causes symptoms - can have some occasoinal LE edema, abdominal swelling. 3 pillow orthopnea x 1 month  CAD risk factors: HL, HTN, DM2,   2. HTN  - compliant with meds  Past Medical History:  Diagnosis Date  . Anemia   . Anxiety   . Bipolar disorder ([place])   . Depression   . Diabetes mellitus   . Hypertension   . Schizoaffective disorder ([place])      No Known Allergies   Current Outpatient Prescriptions  Medication Sig Dispense Refill  . albuterol (PROVENTIL HFA;VENTOLIN HFA) 108 (90 BASE) MCG/ACT inhaler Inhale 2 puffs into the lungs every 6 (six) hours as needed for wheezing. Reported on 10/03/2015    . aspirin EC 81 MG tablet Take 81 mg by mouth daily.    Marland Kitchen atorvastatin (LIPITOR) 10 MG tablet Take 1 tablet by mouth daily.    . benztropine (COGENTIN) 1 MG tablet Take 1 tablet (1 mg total) by mouth at bedtime. 30 tablet 2  . BYDUREON BCISE 2 MG/0.85ML AUIJ Inject 2 mg into the skin. Once a week on Mondays    . CARVEDILOL PO Take by mouth.    . dicyclomine (BENTYL) 10 MG capsule Take 1 capsule (10 mg total) by mouth 3 (three) times daily before meals. 90 capsule 2  . DULoxetine (CYMBALTA) 60 MG capsule Take 1 capsule (60 mg total) by mouth daily at 6 PM. 30 capsule 2  . gabapentin (NEURONTIN) 300 MG capsule Take 300 mg by mouth 3 (three) times daily.     . Insulin Glargine (TOUJEO MAX SOLOSTAR [place]) Inject 125 Units into the skin daily.    . insulin lispro (HUMALOG) 100 UNIT/ML injection Inject 15 Units into the skin 3 (three) times daily before meals.     Marland Kitchen levothyroxine (SYNTHROID, LEVOTHROID) 25 MCG  tablet Take 25 mcg by mouth daily before breakfast.     . omeprazole (PRILOSEC) 20 MG capsule Take 1 capsule (20 mg total) by mouth daily before supper.    . ondansetron (ZOFRAN) 4 MG tablet Take 1 tablet (4 mg total) by mouth every 6 (six) hours. 12 tablet 0  . pioglitazone (ACTOS) 15 MG tablet Take 15 mg by mouth daily.    . ranitidine (ZANTAC) 150 MG tablet Take 1 tablet (150 mg total) by mouth at bedtime as needed for heartburn. 30 tablet 5  . risperiDONE (RISPERDAL) 2 MG tablet Take 2 tablets (4 mg total) by mouth at bedtime. 60 tablet 2  . traMADol (ULTRAM) 50 MG tablet Take 1 tablet (50 mg total) by mouth every 6 (six) hours as needed. 10 tablet 0  . traZODone (DESYREL) 100 MG tablet Take 2 tablets (200 mg total) by mouth at bedtime. 60 tablet 2  . Vitamin D, Ergocalciferol, (DRISDOL) 50000 units CAPS capsule Take 50,000 Units by mouth every Sunday.      No current facility-administered medications for this visit.      Past Surgical History:  Procedure Laterality Date  . BREAST SURGERY Right    biopsy  . CESAREAN SECTION    . CHOLECYSTECTOMY  06/02/2012   Procedure: LAPAROSCOPIC CHOLECYSTECTOMY;  Surgeon: Jamesetta So, MD;  Location: [facility];  Service: General;  Laterality: N/A;  Attempted laparoscopic cholecystectomy  . CHOLECYSTECTOMY  06/02/2012   Procedure: CHOLECYSTECTOMY;  Surgeon: Jamesetta So, MD;  Location: [facility];  Service: General;  Laterality: N/A;  converted to open at  0905  . ESOPHAGOGASTRODUODENOSCOPY (EGD) WITH PROPOFOL N/A 08/24/2015   Procedure: ESOPHAGOGASTRODUODENOSCOPY (EGD) WITH PROPOFOL;  Surgeon: Rogene Houston, MD;  Location: [facility];  Service: Endoscopy;  Laterality: N/A;  1:10 - Ann to notify pt to arrive at 11:30  . TUBAL LIGATION     X3     No Known Allergies    Family History  Problem Relation Age of Onset  . Hypertension Mother   . Alcohol abuse Mother   . Hypertension Father   . Alcohol abuse Sister   . Stroke Other   . Diabetes  Other   . Cancer Other   . Seizures Other   . Alcohol abuse Maternal Aunt   . Alcohol abuse Paternal Aunt   . Alcohol abuse Maternal Grandfather   . Alcohol abuse Maternal Grandmother   . Alcohol abuse Cousin      Social History Ms. Cuccaro reports that she has never smoked. She has never used smokeless tobacco. Ms. Rajagopalan reports that she does not drink alcohol.   Review of Systems CONSTITUTIONAL: No weight loss, fever, chills, weakness or fatigue.  HEENT: Eyes: No visual loss, blurred vision, double vision or yellow sclerae.No hearing loss, sneezing, congestion, runny nose or sore throat.  SKIN: No rash or itching.  CARDIOVASCULAR: per hpi RESPIRATORY: No shortness of breath, cough or sputum.  GASTROINTESTINAL: No anorexia, nausea, vomiting or diarrhea. No abdominal pain or blood.  GENITOURINARY: No burning on urination, no polyuria NEUROLOGICAL: No headache, dizziness, syncope, paralysis, ataxia, numbness or tingling in the extremities. No change in bowel or bladder control.  MUSCULOSKELETAL: No muscle, back pain, joint pain or stiffness.  LYMPHATICS: No enlarged nodes. No history of splenectomy.  PSYCHIATRIC: No history of depression or anxiety.  ENDOCRINOLOGIC: No reports of sweating, cold or heat intolerance. No polyuria or polydipsia.  Marland Kitchen   Physical Examination Vitals:   02/10/17 0845 02/10/17 0848  BP: (!) 160/98 (!) 158/94  Pulse: (!) 103 (!) 102  SpO2: 96% 96%   Vitals:   02/10/17 0845  Weight: 176 lb (79.8 kg)  Height: 5\' 1"  (1.549 m)    Gen: resting comfortably, no acute distress HEENT: no scleral icterus, pupils equal round and reactive, no palptable cervical adenopathy,  CV: RRR, no m/r/,g no jvd Resp: Clear to auscultation bilaterally GI: abdomen is soft, non-tender, non-distended, normal bowel sounds, no hepatosplenomegaly MSK: extremities are warm, no edema.  Skin: warm, no rash Neuro:  no focal deficits Psych: appropriate affect    Assessment  and Plan  1. Chest pain - we will iniitally order an echo to evaluate for structurla heart disease. Pending results consider ischemic testing  2. HTN - she is unsure of her meds - she will come for a nursing visit with all her pill bottles to review   F/u 1 month  Arnoldo Lenis, M.D.

## 2017-02-11 ENCOUNTER — Ambulatory Visit: Payer: Medicare Other

## 2017-02-12 ENCOUNTER — Ambulatory Visit (HOSPITAL_COMMUNITY)
Admission: RE | Admit: 2017-02-12 | Discharge: 2017-02-12 | Disposition: A | Discharge status: Home / Self Care | Payer: Medicare Other | Source: Ambulatory Visit | Attending: Cardiology | Admitting: Cardiology

## 2017-02-12 DIAGNOSIS — F4310 Post-traumatic stress disorder, unspecified: Secondary | ICD-10-CM | POA: Insufficient documentation

## 2017-02-12 DIAGNOSIS — E119 Type 2 diabetes mellitus without complications: Secondary | ICD-10-CM | POA: Diagnosis not present

## 2017-02-12 DIAGNOSIS — I10 Essential (primary) hypertension: Secondary | ICD-10-CM | POA: Insufficient documentation

## 2017-02-12 DIAGNOSIS — F259 Schizoaffective disorder, unspecified: Secondary | ICD-10-CM | POA: Diagnosis not present

## 2017-02-12 DIAGNOSIS — D649 Anemia, unspecified: Secondary | ICD-10-CM | POA: Insufficient documentation

## 2017-02-12 DIAGNOSIS — R079 Chest pain, unspecified: Secondary | ICD-10-CM

## 2017-02-12 DIAGNOSIS — I313 Pericardial effusion (noninflammatory): Secondary | ICD-10-CM | POA: Diagnosis not present

## 2017-02-12 NOTE — Progress Notes (Signed)
*  PRELIMINARY RESULTS* Echocardiogram 2D Echocardiogram has been performed.  Leavy Cella 02/12/2017, 1:41 PM

## 2017-02-16 ENCOUNTER — Emergency Department (HOSPITAL_COMMUNITY)
Admission: EM | Admit: 2017-02-16 | Discharge: 2017-02-16 | Disposition: A | Discharge status: Home / Self Care | Payer: Medicare Other | Attending: Emergency Medicine | Admitting: Emergency Medicine

## 2017-02-16 ENCOUNTER — Emergency Department (HOSPITAL_COMMUNITY): Payer: Medicare Other

## 2017-02-16 ENCOUNTER — Encounter (HOSPITAL_COMMUNITY): Payer: Self-pay

## 2017-02-16 DIAGNOSIS — I10 Essential (primary) hypertension: Secondary | ICD-10-CM | POA: Diagnosis not present

## 2017-02-16 DIAGNOSIS — R079 Chest pain, unspecified: Secondary | ICD-10-CM | POA: Diagnosis present

## 2017-02-16 DIAGNOSIS — R0789 Other chest pain: Secondary | ICD-10-CM | POA: Diagnosis not present

## 2017-02-16 DIAGNOSIS — E876 Hypokalemia: Secondary | ICD-10-CM | POA: Diagnosis not present

## 2017-02-16 DIAGNOSIS — R1010 Upper abdominal pain, unspecified: Secondary | ICD-10-CM | POA: Diagnosis not present

## 2017-02-16 DIAGNOSIS — R112 Nausea with vomiting, unspecified: Secondary | ICD-10-CM | POA: Insufficient documentation

## 2017-02-16 DIAGNOSIS — Z794 Long term (current) use of insulin: Secondary | ICD-10-CM | POA: Diagnosis not present

## 2017-02-16 DIAGNOSIS — Z79899 Other long term (current) drug therapy: Secondary | ICD-10-CM | POA: Insufficient documentation

## 2017-02-16 DIAGNOSIS — E119 Type 2 diabetes mellitus without complications: Secondary | ICD-10-CM | POA: Diagnosis not present

## 2017-02-16 DIAGNOSIS — Z7982 Long term (current) use of aspirin: Secondary | ICD-10-CM | POA: Diagnosis not present

## 2017-02-16 DIAGNOSIS — R197 Diarrhea, unspecified: Secondary | ICD-10-CM | POA: Diagnosis not present

## 2017-02-16 LAB — CBC WITH DIFFERENTIAL/PLATELET
Basophils Absolute: 0 10*3/uL (ref 0.0–0.1)
Basophils Relative: 0 %
Eosinophils Absolute: 0.3 10*3/uL (ref 0.0–0.7)
Eosinophils Relative: 2 %
HCT: 37.5 % (ref 36.0–46.0)
Hemoglobin: 13.1 g/dL (ref 12.0–15.0)
Lymphocytes Relative: 19 %
Lymphs Abs: 2.3 10*3/uL (ref 0.7–4.0)
MCH: 28.1 pg (ref 26.0–34.0)
MCHC: 34.9 g/dL (ref 30.0–36.0)
MCV: 80.3 fL (ref 78.0–100.0)
Monocytes Absolute: 0.8 10*3/uL (ref 0.1–1.0)
Monocytes Relative: 7 %
Neutro Abs: 8.9 10*3/uL — ABNORMAL HIGH (ref 1.7–7.7)
Neutrophils Relative %: 72 %
Platelets: 321 10*3/uL (ref 150–400)
RBC: 4.67 MIL/uL (ref 3.87–5.11)
RDW: 12.8 % (ref 11.5–15.5)
WBC: 12.3 10*3/uL — ABNORMAL HIGH (ref 4.0–10.5)

## 2017-02-16 LAB — URINALYSIS, ROUTINE W REFLEX MICROSCOPIC
Bilirubin Urine: NEGATIVE
Glucose, UA: NEGATIVE mg/dL
Hgb urine dipstick: NEGATIVE
Ketones, ur: NEGATIVE mg/dL
Nitrite: NEGATIVE
Protein, ur: NEGATIVE mg/dL
Specific Gravity, Urine: 1.014 (ref 1.005–1.030)
pH: 5 (ref 5.0–8.0)

## 2017-02-16 LAB — COMPREHENSIVE METABOLIC PANEL
ALT: 21 U/L (ref 14–54)
AST: 20 U/L (ref 15–41)
Albumin: 4.2 g/dL (ref 3.5–5.0)
Alkaline Phosphatase: 91 U/L (ref 38–126)
Anion gap: 9 (ref 5–15)
BUN: 17 mg/dL (ref 6–20)
CO2: 29 mmol/L (ref 22–32)
Calcium: 9.4 mg/dL (ref 8.9–10.3)
Chloride: 100 mmol/L — ABNORMAL LOW (ref 101–111)
Creatinine, Ser: 0.82 mg/dL (ref 0.44–1.00)
GFR calc Af Amer: >60 mL/min (ref >60)
GFR calc non Af Amer: >60 mL/min (ref >60)
Glucose, Bld: 198 mg/dL — ABNORMAL HIGH (ref 65–99)
Potassium: 3.3 mmol/L — ABNORMAL LOW (ref 3.5–5.1)
Sodium: 138 mmol/L (ref 135–145)
Total Bilirubin: 0.5 mg/dL (ref 0.3–1.2)
Total Protein: 8.3 g/dL — ABNORMAL HIGH (ref 6.5–8.1)

## 2017-02-16 LAB — LIPASE, BLOOD: Lipase: 18 U/L (ref 11–51)

## 2017-02-16 MED ORDER — KETOROLAC TROMETHAMINE 30 MG/ML IJ SOLN
15.0000 mg | Freq: Once | INTRAMUSCULAR | Status: AC
  Administered 2017-02-16: 15 mg via INTRAVENOUS
  Filled 2017-02-16: qty 1

## 2017-02-16 MED ORDER — FAMOTIDINE IN NACL 20-0.9 MG/50ML-% IV SOLN
20.0000 mg | Freq: Once | INTRAVENOUS | Status: AC
  Administered 2017-02-16: 20 mg via INTRAVENOUS
  Filled 2017-02-16: qty 50

## 2017-02-16 MED ORDER — POTASSIUM CHLORIDE ER 20 MEQ PO TBCR: 20.0000 meq | EXTENDED_RELEASE_TABLET | Freq: Two times a day (BID) | ORAL | 0 refills | Status: DC

## 2017-02-16 MED ORDER — SODIUM CHLORIDE 0.9 % IV BOLUS (SEPSIS)
1000.0000 mL | Freq: Once | INTRAVENOUS | Status: AC
  Administered 2017-02-16: 1000 mL via INTRAVENOUS

## 2017-02-16 MED ORDER — FAMOTIDINE 20 MG PO TABS: 20.0000 mg | ORAL_TABLET | Freq: Two times a day (BID) | ORAL | 0 refills | Status: DC

## 2017-02-16 MED ORDER — ONDANSETRON HCL 4 MG/2ML IJ SOLN
4.0000 mg | Freq: Once | INTRAMUSCULAR | Status: AC
  Administered 2017-02-16: 4 mg via INTRAVENOUS
  Filled 2017-02-16: qty 2

## 2017-02-16 MED ORDER — SODIUM CHLORIDE 0.9 % IV BOLUS (SEPSIS): 500.0000 mL | Freq: Once | INTRAVENOUS | Status: DC

## 2017-02-16 MED ORDER — ONDANSETRON HCL 4 MG PO TABS: 4.0000 mg | ORAL_TABLET | Freq: Three times a day (TID) | ORAL | 0 refills | Status: DC | PRN

## 2017-02-16 MED ORDER — LOPERAMIDE HCL 2 MG PO CAPS
4.0000 mg | ORAL_CAPSULE | Freq: Once | ORAL | Status: AC
  Administered 2017-02-16: 4 mg via ORAL
  Filled 2017-02-16: qty 2

## 2017-02-16 NOTE — ED Triage Notes (Signed)
Pt reports N/V/D and left flank pain that radiates to lower back. Symptoms started today. Pt took nausea medicine that she took yesterday afternoon that helped N/V but she only had one pill left. Six episodes of diarrhea reported.

## 2017-02-16 NOTE — Discharge Instructions (Signed)
Drink plenty of fluids (clear liquids) then start a bland diet later in the day such as toast, crackers, jello, [employer] chicken noodle soup. Use the zofran for nausea or vomiting. Take imodium OTC for diarrhea. Avoid milk products until the diarrhea is gone. Take the potassium pills until gone. You can take acetaminophen for your chest wall pain.  Recheck if you get worse.

## 2017-02-16 NOTE — ED Provider Notes (Signed)
[facility] DEPT Provider Note   CSN: [id number] Arrival date & time: 02/16/17  0103  Time seen 02:20 AM   History   Chief Complaint Chief Complaint  Patient presents with  . Flank Pain    radiating to lower back    HPI Kathryn [name] is a 46 y.o. female.  HPI  Patient states she has had a cough that is dry for the past week. She denies fever. She states she also has had left lateral chest pain for the past week that is constant and she describes it as sharp and pressure. Nothing she does makes it feel worse, nothing she does makes it feel better. She has tried Tylenol, heat, and ice. She denies sore throat or rhinorrhea but states she's been having nausea and vomiting that started also about a week ago. She took a nausea pill when it first started and the nausea did not return until September 22. She states she's vomited twice since then. She also has a burning upper abdominal discomfort without acid reflux in her throat. She states she's had diarrhea with 4-6 episodes of loose stools a day including 6 today. She denies being on antibiotics. She denies any change in her foods are being around anybody else is sick. He denies that her tongue feels dry, but states she's having decreased urinary output.  Patient states she's taken all of her medicines today including her blood pressure medications. She states when she goes for Dr. Delton See normally high and is in the 150s over 90s range.  PCP [name], MD   Past Medical History:  Diagnosis Date  . Anemia   . Anxiety   . Bipolar disorder ([place])   . Depression   . Diabetes mellitus   . Hypertension   . Schizoaffective disorder [facility])     Patient Active Problem List   Diagnosis Date Noted  . GERD (gastroesophageal reflux disease) 10/08/2015  . IBS (irritable colon syndrome) 10/08/2015  . Agitation 09/07/2015  . Lithium toxicity 09/07/2015  . Altered mental status   . Elevated liver enzymes 09/06/2015  . AKI (acute kidney injury)  ([place]) 09/06/2015  . Diabetes ([place]) 08/22/2015  . Essential hypertension 08/22/2015  . Schizoaffective disorder ([place]) 02/08/2015  . PTSD (post-traumatic stress disorder) 02/08/2015  . Acute low back pain due to trauma 09/21/2012    Past Surgical History:  Procedure Laterality Date  . BREAST SURGERY Right    biopsy  . CESAREAN SECTION    . CHOLECYSTECTOMY  06/02/2012   Procedure: LAPAROSCOPIC CHOLECYSTECTOMY;  Surgeon: Jamesetta So, MD;  Location: [facility];  Service: General;  Laterality: N/A;  Attempted laparoscopic cholecystectomy  . CHOLECYSTECTOMY  06/02/2012   Procedure: CHOLECYSTECTOMY;  Surgeon: Jamesetta So, MD;  Location: [facility];  Service: General;  Laterality: N/A;  converted to open at  0905  . ESOPHAGOGASTRODUODENOSCOPY (EGD) WITH PROPOFOL N/A 08/24/2015   Procedure: ESOPHAGOGASTRODUODENOSCOPY (EGD) WITH PROPOFOL;  Surgeon: Rogene Houston, MD;  [facility];  Service: Endoscopy;  Laterality: N/A;  1:10 - Ann to notify pt to arrive at 11:30  . TUBAL LIGATION     X3    OB History    Gravida Para Term Preterm AB Living   3 3 3     3    SAB TAB Ectopic Multiple Live Births                   Home Medications    Prior to Admission medications   Medication  Sig Start Date End Date Taking? Authorizing Provider  albuterol (PROVENTIL HFA;VENTOLIN HFA) 108 (90 BASE) MCG/ACT inhaler Inhale 2 puffs into the lungs every 6 (six) hours as needed for wheezing. Reported on 10/03/2015    [provider]  aspirin EC 81 MG tablet Take 81 mg by mouth daily.    [provider]  atorvastatin (LIPITOR) 10 MG tablet Take 1 tablet by mouth daily. 05/04/15   [provider]  benztropine (COGENTIN) 1 MG tablet Take 1 tablet (1 mg total) by mouth at bedtime. 12/24/16 12/24/17  Cloria Spring, MD  BYDUREON BCISE 2 MG/0.85ML AUIJ Inject 2 mg into the skin. Once a week on Mondays 08/12/16   [provider]  CARVEDILOL PO Take 12.5 mg by mouth 2 (two) times  daily.     [provider]  dicyclomine (BENTYL) 10 MG capsule Take 1 capsule (10 mg total) by mouth 3 (three) times daily before meals. 10/07/16   Rogene Houston, MD  DULoxetine (CYMBALTA) 60 MG capsule Take 1 capsule (60 mg total) by mouth daily at 6 PM. 12/24/16   Cloria Spring, MD  famotidine (PEPCID) 20 MG tablet Take 1 tablet (20 mg total) by mouth 2 (two) times daily. 02/16/17   Rolland Porter, MD  gabapentin (NEURONTIN) 300 MG capsule Take 300 mg by mouth 3 (three) times daily.  03/28/15   [provider]  Insulin Glargine (TOUJEO MAX SOLOSTAR [place]) Inject 125 Units into the skin daily.    [provider]  insulin lispro (HUMALOG) 100 UNIT/ML injection Inject 15 Units into the skin 3 (three) times daily before meals.     [provider]  levothyroxine (SYNTHROID, LEVOTHROID) 25 MCG tablet Take 25 mcg by mouth daily before breakfast.  04/15/15   [provider]  LINZESS 145 MCG CAPS capsule Take 145 mcg by mouth daily before breakfast.  02/04/17   [provider]  lisinopril (PRINIVIL,ZESTRIL) 40 MG tablet Take 40 mg by mouth daily.    [provider]  omeprazole (PRILOSEC) 20 MG capsule Take 1 capsule (20 mg total) by mouth daily before supper. 04/08/16   [name], Mechele Dawley, MD  ondansetron (ZOFRAN) 4 MG tablet Take 1 tablet (4 mg total) by mouth every 8 (eight) hours as needed. 02/16/17   Rolland Porter, MD  potassium chloride 20 MEQ TBCR Take 20 mEq by mouth 2 (two) times daily. 02/16/17   Rolland Porter, MD  risperiDONE (RISPERDAL) 2 MG tablet Take 2 tablets (4 mg total) by mouth at bedtime. 12/24/16 12/24/17  Cloria Spring, MD  traMADol (ULTRAM) 50 MG tablet Take 1 tablet (50 mg total) by mouth every 6 (six) hours as needed. 01/26/16   Triplett, Tammy, PA-C  traZODone (DESYREL) 100 MG tablet Take 2 tablets (200 mg total) by mouth at bedtime. 12/24/16   Cloria Spring, MD  Vitamin D, Ergocalciferol, (DRISDOL) 50000 units CAPS capsule Take 50,000 Units  by mouth every Sunday.     [provider]    Family History Family History  Problem Relation Age of Onset  . Hypertension Mother   . Alcohol abuse Mother   . Hypertension Father   . Alcohol abuse Sister   . Stroke Other   . Diabetes Other   . Cancer Other   . Seizures Other   . Alcohol abuse Maternal Aunt   . Alcohol abuse Paternal Aunt   . Alcohol abuse Maternal Grandfather   . Alcohol abuse Maternal Grandmother   .  Alcohol abuse Cousin     Social History Social History  Substance Use Topics  . Smoking status: Never Smoker  . Smokeless tobacco: Never Used  . Alcohol use No     Allergies   Patient has no known allergies.   Review of Systems Review of Systems  All other systems reviewed and are negative.    Physical Exam Updated Vital Signs BP 117/70   Pulse 85   Temp 98.9 F (37.2 C) (Oral)   Resp 17   Ht 5\' 2"  (1.575 m)   Wt 74.8 kg (165 lb)   LMP 01/23/2013   SpO2 100%   BMI 30.18 kg/m   Physical Exam  Constitutional: She is oriented to person, place, and time. She appears well-developed and well-nourished.  Non-toxic appearance. She does not appear ill. No distress.  HENT:  Head: Normocephalic and atraumatic.  Right Ear: External ear normal.  Left Ear: External ear normal.  Nose: Nose normal. No mucosal edema or rhinorrhea.  Mouth/Throat: Oropharynx is clear and moist and mucous membranes are normal. No dental abscesses or uvula swelling.  Eyes: Pupils are equal, round, and reactive to light. Conjunctivae and EOM are normal.  Neck: Normal range of motion and full passive range of motion without pain. Neck supple.  Cardiovascular: Normal rate, regular rhythm and normal heart sounds.  Exam reveals no gallop and no friction rub.   No murmur heard. Pulmonary/Chest: Effort normal and breath sounds normal. No respiratory distress. She has no wheezes. She has no rhonchi. She has no rales. She exhibits no tenderness and no crepitus.     Abdominal: Soft. Normal appearance and bowel sounds are normal. She exhibits no distension. There is no tenderness. There is no rebound and no guarding.    Area of pain noted  Musculoskeletal: Normal range of motion. She exhibits no edema or tenderness.  Moves all extremities well.   Lymphadenopathy:    She has no cervical adenopathy.  Neurological: She is alert and oriented to person, place, and time. She has normal strength. No cranial nerve deficit.  Skin: Skin is warm, dry and intact. No rash noted. No erythema. No pallor.  Psychiatric: She has a normal mood and affect. Her speech is normal and behavior is normal. Her mood appears not anxious.  Nursing note and vitals reviewed.    ED Treatments / Results  Labs (all labs ordered are listed, but only abnormal results are displayed) Results for orders placed or performed during the hospital encounter of 02/16/17  Urinalysis, Routine w reflex microscopic  Result Value Ref Range   Color, Urine YELLOW YELLOW   APPearance HAZY (A) CLEAR   Specific Gravity, Urine 1.014 1.005 - 1.030   pH 5.0 5.0 - 8.0   Glucose, UA NEGATIVE NEGATIVE mg/dL   Hgb urine dipstick NEGATIVE NEGATIVE   Bilirubin Urine NEGATIVE NEGATIVE   Ketones, ur NEGATIVE NEGATIVE mg/dL   Protein, ur NEGATIVE NEGATIVE mg/dL   Nitrite NEGATIVE NEGATIVE   Leukocytes, UA SMALL (A) NEGATIVE   RBC / HPF 0-5 0 - 5 RBC/hpf   WBC, UA 0-5 0 - 5 WBC/hpf   Bacteria, UA RARE (A) NONE SEEN   Squamous Epithelial / LPF 6-30 (A) NONE SEEN  Comprehensive metabolic panel  Result Value Ref Range   Sodium 138 135 - 145 mmol/L   Potassium 3.3 (L) 3.5 - 5.1 mmol/L   Chloride 100 (L) 101 - 111 mmol/L   CO2 29 22 - 32 mmol/L   Glucose, Bld 198 (  H) 65 - 99 mg/dL   BUN 17 6 - 20 mg/dL   Creatinine, Ser 0.82 0.44 - 1.00 mg/dL   Calcium 9.4 8.9 - 10.3 mg/dL   Total Protein 8.3 (H) 6.5 - 8.1 g/dL   Albumin 4.2 3.5 - 5.0 g/dL   AST 20 15 - 41 U/L   ALT 21 14 - 54 U/L   Alkaline  Phosphatase 91 38 - 126 U/L   Total Bilirubin 0.5 0.3 - 1.2 mg/dL   GFR calc non Af Amer >60 >60 mL/min   GFR calc Af Amer >60 >60 mL/min   Anion gap 9 5 - 15  Lipase, blood  Result Value Ref Range   Lipase 18 11 - 51 U/L  CBC with Differential  Result Value Ref Range   WBC 12.3 (H) 4.0 - 10.5 K/uL   RBC 4.67 3.87 - 5.11 MIL/uL   Hemoglobin 13.1 12.0 - 15.0 g/dL   HCT 37.5 36.0 - 46.0 %   MCV 80.3 78.0 - 100.0 fL   MCH 28.1 26.0 - 34.0 pg   MCHC 34.9 30.0 - 36.0 g/dL   RDW 12.8 11.5 - 15.5 %   Platelets 321 150 - 400 K/uL   Neutrophils Relative % 72 %   Neutro Abs 8.9 (H) 1.7 - 7.7 K/uL   Lymphocytes Relative 19 %   Lymphs Abs 2.3 0.7 - 4.0 K/uL   Monocytes Relative 7 %   Monocytes Absolute 0.8 0.1 - 1.0 K/uL   Eosinophils Relative 2 %   Eosinophils Absolute 0.3 0.0 - 0.7 K/uL   Basophils Relative 0 %   Basophils Absolute 0.0 0.0 - 0.1 K/uL   Laboratory interpretation all normal except mild hypokalemia, leukocytosis    EKG  EKG Interpretation None       Radiology Dg Ribs Unilateral W/chest Left  Result Date: 02/16/2017 CLINICAL DATA:  Cough for 1 week with left lateral lower rib pain when coughing. EXAM: LEFT RIBS AND CHEST - 3+ VIEW COMPARISON:  Chest radiograph 218 FINDINGS: No fracture or other bone lesions are seen involving the ribs. There is no evidence of pneumothorax or pleural effusion. Both lungs are clear. Heart size and mediastinal contours are within normal limits. IMPRESSION: Negative radiographs of the chest and left ribs. Electronically Signed   By: Jeb Levering M.D.   On: 02/16/2017 03:03    Procedures Procedures (including critical care time)  Medications Ordered in ED Medications  sodium chloride 0.9 % bolus 500 mL (not administered)  ondansetron (ZOFRAN) injection 4 mg (4 mg Intravenous Given 02/16/17 0302)  ketorolac (TORADOL) 30 MG/ML injection 15 mg (15 mg Intravenous Given 02/16/17 0302)  loperamide (IMODIUM) capsule 4 mg (4 mg Oral  Given 02/16/17 0252)  sodium chloride 0.9 % bolus 1,000 mL (1,000 mLs Intravenous Restarted 02/16/17 0449)  famotidine (PEPCID) IVPB 20 mg premix (0 mg Intravenous Stopped 02/16/17 0435)     Initial Impression / Assessment and Plan / ED Course  I have reviewed the triage vital signs and the nursing notes.  Pertinent labs & imaging results that were available during my care of the patient were reviewed by me and considered in my medical decision making (see chart for details).     Patient was given IV Zofran, IV Toradol, and oral Imodium for her symptoms. Left rib chest x-ray was done to look for occult rib fracture from coughing.she was given IV fluids for dehydration.  Recheck at 4 AM patient states she's feeling much improved.her blood pressure  improved to 137/83 without specific antihypertensive treatment. She states she still has some upper abdominal burning, she was given IV Pepcid. Hopefully then she will be able to be discharged. We did discuss her test results which were all reassuring.  Recheck at 5:05 AM she states the burning in her abdomen is gone. Her blood pressure is now 117/70. She feels ready to be discharged home.  Final Clinical Impressions(s) / ED Diagnoses   Final diagnoses:  Left-sided chest wall pain  Upper abdominal pain  Nausea vomiting and diarrhea  Hypokalemia    New Prescriptions New Prescriptions   FAMOTIDINE (PEPCID) 20 MG TABLET    Take 1 tablet (20 mg total) by mouth 2 (two) times daily.   ONDANSETRON (ZOFRAN) 4 MG TABLET    Take 1 tablet (4 mg total) by mouth every 8 (eight) hours as needed.   POTASSIUM CHLORIDE 20 MEQ TBCR    Take 20 mEq by mouth 2 (two) times daily.    Plan discharge  Rolland Porter, MD, Barbette Or, MD 02/16/17 Delrae Rend

## 2017-03-03 ENCOUNTER — Other Ambulatory Visit: Payer: Self-pay

## 2017-03-03 NOTE — Patient Outreach (Signed)
[facility]) Care Management  [place]  03/03/2017   Kathryn [name] 06/21/1970 [id number]  Subjective: Telephone call to patient for monthly call. Patient reports she is doing good and that her sugars are much better.  Patient reports sugars of 146 and 156.  Reinforced with patient importance of continuing to manage her diet and keeping her sugars under control.  She verbalized understanding.  Patient reports she has a eye doctor appointment next month. Discussed importance of yearly diabetic exam.  She verbalized understanding. Discussed with patient case closure on next call as she is controlling her sugars better and meeting goals.  She verbalized understanding.    Objective:   Encounter Medications:  Outpatient Encounter Prescriptions as of 03/03/2017  Medication Sig Note  . albuterol (PROVENTIL HFA;VENTOLIN HFA) 108 (90 BASE) MCG/ACT inhaler Inhale 2 puffs into the lungs every 6 (six) hours as needed for wheezing. Reported on 10/03/2015   . aspirin EC 81 MG tablet Take 81 mg by mouth daily.   Marland Kitchen atorvastatin (LIPITOR) 10 MG tablet Take 1 tablet by mouth daily. 08/28/2016: Patient taking  . benztropine (COGENTIN) 1 MG tablet Take 1 tablet (1 mg total) by mouth at bedtime.   Marland Kitchen BYDUREON BCISE 2 MG/0.85ML AUIJ Inject 2 mg into the skin. Once a week on Mondays   . CARVEDILOL PO Take 12.5 mg by mouth 2 (two) times daily.    Marland Kitchen dicyclomine (BENTYL) 10 MG capsule Take 1 capsule (10 mg total) by mouth 3 (three) times daily before meals.   . DULoxetine (CYMBALTA) 60 MG capsule Take 1 capsule (60 mg total) by mouth daily at 6 PM.   . famotidine (PEPCID) 20 MG tablet Take 1 tablet (20 mg total) by mouth 2 (two) times daily.   Marland Kitchen gabapentin (NEURONTIN) 300 MG capsule Take 300 mg by mouth 3 (three) times daily.    . Insulin Glargine (TOUJEO MAX SOLOSTAR [place]) Inject 125 Units into the skin daily.   . insulin lispro (HUMALOG) 100 UNIT/ML injection Inject 15 Units into the skin 3  (three) times daily before meals.    Marland Kitchen levothyroxine (SYNTHROID, LEVOTHROID) 25 MCG tablet Take 25 mcg by mouth daily before breakfast.    . LINZESS 145 MCG CAPS capsule Take 145 mcg by mouth daily before breakfast.    . lisinopril (PRINIVIL,ZESTRIL) 40 MG tablet Take 40 mg by mouth daily.   Marland Kitchen omeprazole (PRILOSEC) 20 MG capsule Take 1 capsule (20 mg total) by mouth daily before supper.   . ondansetron (ZOFRAN) 4 MG tablet Take 1 tablet (4 mg total) by mouth every 8 (eight) hours as needed.   . potassium chloride 20 MEQ TBCR Take 20 mEq by mouth 2 (two) times daily.   . risperiDONE (RISPERDAL) 2 MG tablet Take 2 tablets (4 mg total) by mouth at bedtime.   . traMADol (ULTRAM) 50 MG tablet Take 1 tablet (50 mg total) by mouth every 6 (six) hours as needed.   . traZODone (DESYREL) 100 MG tablet Take 2 tablets (200 mg total) by mouth at bedtime.   . Vitamin D, Ergocalciferol, (DRISDOL) 50000 units CAPS capsule Take 50,000 Units by mouth every Sunday.     No facility-administered encounter medications on file as of 03/03/2017.     Functional Status:  No flowsheet data found.  Fall/Depression Screening: Fall Risk  02/04/2017 01/07/2017 12/12/2016  Falls in the past year? No No No  Risk for fall due to : - - -   PHQ 2/9  Scores 03/03/2017 02/04/2017 01/07/2017 12/12/2016 11/14/2016 09/23/2016 08/28/2016  PHQ - 2 Score 0 0 1 1 1  0 0  PHQ- 9 Score - - - - - - -    Assessment: Patient continues to benefit from health coach outreach for disease management and support.    Plan:  [facility] CM Care Plan Problem Two     Most Recent Value  Care Plan Problem Two  knowledge deficit related to diabetes as evidenced by questions around diabetes from patient  Role Documenting the Problem [place] for Problem Two  Active  Interventions for Problem Two Long Term Goal   RN Health Coach reinforced with patient blood sugar readings, importance of diet, meal planning, and exercise in decreasing sugars.     [facility] Long Term Goal  Patient will have knowledge of diabetic diet over the next 90 days  [facility] Long Term Goal Start Date  03/03/17 [facility] continued]     [place] will contact patient in the month of November and patient agrees to next outreach.  Jone Baseman, RN, MSN [facility] Care Management Care Management Telephonic Coordinator Direct Line [phone] Toll Free: [phone]  Fax: [phone]

## 2017-03-12 ENCOUNTER — Ambulatory Visit: Payer: Self-pay | Admitting: Cardiology

## 2017-03-12 ENCOUNTER — Encounter: Payer: Self-pay | Admitting: Cardiology

## 2017-03-12 NOTE — Progress Notes (Deleted)
Clinical Summary Kathryn [name] is a 46 y.o.female  1. Chest pain - started about 1 month ago. Sharp/pressure like pain left side, 10/10 in severity. Can occur at rest or with exertion. Can feel SOB, hot, sweaty, nauseous. Lasts 5-10 seconds, occurs daily. Can be worst with position - +DOE which is new, example doing laundry causes symptoms - can have some occasoinal LE edema, abdominal swelling. 3 pillow orthopnea x 1 month  CAD risk factors: HL, HTN, DM2,    01/2017 echo LVEF 60-65%, no WMAs    2. HTN  - compliant with meds Past Medical History:  Diagnosis Date  . Anemia   . Anxiety   . Bipolar disorder ([place])   . Depression   . Diabetes mellitus   . Hypertension   . Schizoaffective disorder ([place])      No Known Allergies   Current Outpatient Prescriptions  Medication Sig Dispense Refill  . albuterol (PROVENTIL HFA;VENTOLIN HFA) 108 (90 BASE) MCG/ACT inhaler Inhale 2 puffs into the lungs every 6 (six) hours as needed for wheezing. Reported on 10/03/2015    . aspirin EC 81 MG tablet Take 81 mg by mouth daily.    Marland Kitchen atorvastatin (LIPITOR) 10 MG tablet Take 1 tablet by mouth daily.    . benztropine (COGENTIN) 1 MG tablet Take 1 tablet (1 mg total) by mouth at bedtime. 30 tablet 2  . BYDUREON BCISE 2 MG/0.85ML AUIJ Inject 2 mg into the skin. Once a week on Mondays    . CARVEDILOL PO Take 12.5 mg by mouth 2 (two) times daily.     Marland Kitchen dicyclomine (BENTYL) 10 MG capsule Take 1 capsule (10 mg total) by mouth 3 (three) times daily before meals. 90 capsule 2  . DULoxetine (CYMBALTA) 60 MG capsule Take 1 capsule (60 mg total) by mouth daily at 6 PM. 30 capsule 2  . famotidine (PEPCID) 20 MG tablet Take 1 tablet (20 mg total) by mouth 2 (two) times daily. 20 tablet 0  . gabapentin (NEURONTIN) 300 MG capsule Take 300 mg by mouth 3 (three) times daily.     . Insulin Glargine (TOUJEO MAX SOLOSTAR [place]) Inject 125 Units into the skin daily.    . insulin lispro (HUMALOG) 100 UNIT/ML  injection Inject 15 Units into the skin 3 (three) times daily before meals.     Marland Kitchen levothyroxine (SYNTHROID, LEVOTHROID) 25 MCG tablet Take 25 mcg by mouth daily before breakfast.     . LINZESS 145 MCG CAPS capsule Take 145 mcg by mouth daily before breakfast.     . lisinopril (PRINIVIL,ZESTRIL) 40 MG tablet Take 40 mg by mouth daily.    Marland Kitchen omeprazole (PRILOSEC) 20 MG capsule Take 1 capsule (20 mg total) by mouth daily before supper.    . ondansetron (ZOFRAN) 4 MG tablet Take 1 tablet (4 mg total) by mouth every 8 (eight) hours as needed. 6 tablet 0  . potassium chloride 20 MEQ TBCR Take 20 mEq by mouth 2 (two) times daily. 8 tablet 0  . risperiDONE (RISPERDAL) 2 MG tablet Take 2 tablets (4 mg total) by mouth at bedtime. 60 tablet 2  . traMADol (ULTRAM) 50 MG tablet Take 1 tablet (50 mg total) by mouth every 6 (six) hours as needed. 10 tablet 0  . traZODone (DESYREL) 100 MG tablet Take 2 tablets (200 mg total) by mouth at bedtime. 60 tablet 2  . Vitamin D, Ergocalciferol, (DRISDOL) 50000 units CAPS capsule Take 50,000 Units by mouth every Sunday.  No current facility-administered medications for this visit.      Past Surgical History:  Procedure Laterality Date  . BREAST SURGERY Right    biopsy  . CESAREAN SECTION    . CHOLECYSTECTOMY  06/02/2012   Procedure: LAPAROSCOPIC CHOLECYSTECTOMY;  Surgeon: Jamesetta So, MD;  Location: [facility] ORS;  Service: General;  Laterality: N/A;  Attempted laparoscopic cholecystectomy  . CHOLECYSTECTOMY  06/02/2012   Procedure: CHOLECYSTECTOMY;  Surgeon: Jamesetta So, MD;  Location: [facility] ORS;  Service: General;  Laterality: N/A;  converted to open at  0905  . ESOPHAGOGASTRODUODENOSCOPY (EGD) WITH PROPOFOL N/A 08/24/2015   Procedure: ESOPHAGOGASTRODUODENOSCOPY (EGD) WITH PROPOFOL;  Surgeon: Rogene Houston, MD;  Location: [facility] ENDO SUITE;  Service: Endoscopy;  Laterality: N/A;  1:10 - Ann to notify pt to arrive at 11:30  . TUBAL LIGATION     X3     No Known  Allergies    Family History  Problem Relation Age of Onset  . Hypertension Mother   . Alcohol abuse Mother   . Hypertension Father   . Alcohol abuse Sister   . Stroke Other   . Diabetes Other   . Cancer Other   . Seizures Other   . Alcohol abuse Maternal Aunt   . Alcohol abuse Paternal Aunt   . Alcohol abuse Maternal Grandfather   . Alcohol abuse Maternal Grandmother   . Alcohol abuse Cousin      Social History Kathryn [name] reports that she has never smoked. She has never used smokeless tobacco. Kathryn [name] reports that she does not drink alcohol.   Review of Systems CONSTITUTIONAL: No weight loss, fever, chills, weakness or fatigue.  HEENT: Eyes: No visual loss, blurred vision, double vision or yellow sclerae.No hearing loss, sneezing, congestion, runny nose or sore throat.  SKIN: No rash or itching.  CARDIOVASCULAR:  RESPIRATORY: No shortness of breath, cough or sputum.  GASTROINTESTINAL: No anorexia, nausea, vomiting or diarrhea. No abdominal pain or blood.  GENITOURINARY: No burning on urination, no polyuria NEUROLOGICAL: No headache, dizziness, syncope, paralysis, ataxia, numbness or tingling in the extremities. No change in bowel or bladder control.  MUSCULOSKELETAL: No muscle, back pain, joint pain or stiffness.  LYMPHATICS: No enlarged nodes. No history of splenectomy.  PSYCHIATRIC: No history of depression or anxiety.  ENDOCRINOLOGIC: No reports of sweating, cold or heat intolerance. No polyuria or polydipsia.  Marland Kitchen   Physical Examination There were no vitals filed for this visit. There were no vitals filed for this visit.  Gen: resting comfortably, no acute distress HEENT: no scleral icterus, pupils equal round and reactive, no palptable cervical adenopathy,  CV Resp: Clear to auscultation bilaterally GI: abdomen is soft, non-tender, non-distended, normal bowel sounds, no hepatosplenomegaly MSK: extremities are warm, no edema.  Skin: warm, no rash Neuro:   no focal deficits Psych: appropriate affect   Diagnostic Studies 01/2017 echo Study Conclusions  - Left ventricle: The cavity size was normal. Wall thickness was   increased in a pattern of mild LVH. Systolic function was normal.   The estimated ejection fraction was in the range of 60% to 65%.   Wall motion was normal; there were no regional wall motion   abnormalities. - Right atrium: Central venous pressure (est): 3 mm Hg. - Atrial septum: No defect or patent foramen ovale was identified. - Tricuspid valve: There was trivial regurgitation. - Pulmonary arteries: Systolic pressure could not be accurately   estimated. - Pericardium, extracardiac: A small pericardial effusion was   identified  circumferential to the heart.  Impressions:  - Mild LVH with LVEF 60-65%. Indeterminate diastolic function.   Trivial tricuspid regurgitation. Small circumferential   pericardial effusion.       Assessment and Plan  1. Chest pain - we will iniitally order an echo to evaluate for structurla heart disease. Pending results consider ischemic testing  2. HTN - she is unsure of her meds - she will come for a nursing visit with all her pill bottles to review   F/u 1 month      Arnoldo Lenis, M.D., F.A.C.C.

## 2017-03-23 ENCOUNTER — Ambulatory Visit (HOSPITAL_COMMUNITY): Payer: Self-pay | Admitting: Psychiatry

## 2017-03-31 ENCOUNTER — Ambulatory Visit: Payer: Self-pay | Admitting: Cardiology

## 2017-03-31 ENCOUNTER — Encounter: Payer: Self-pay | Admitting: Cardiology

## 2017-03-31 NOTE — Progress Notes (Deleted)
Clinical Summary Ms. Pryer is a 46 y.o.female  1. Chest pain - started about 1 month ago. Sharp/pressure like pain left side, 10/10 in severity. Can occur at rest or with exertion. Can feel SOB, hot, sweaty, nauseous. Lasts 5-10 seconds, occurs daily. Can be worst with position - +DOE which is new, example doing laundry causes symptoms - can have some occasoinal LE edema, abdominal swelling. 3 pillow orthopnea x 1 month  CAD risk factors: HL, HTN, DM2,  - 01/2017 echo LVEF 60-65%, no WMAs    2. HTN  - compliant with meds Past Medical History:  Diagnosis Date  . Anemia   . Anxiety   . Bipolar disorder ([place])   . Depression   . Diabetes mellitus   . Hypertension   . Schizoaffective disorder ([place])      No Known Allergies   Current Outpatient Medications  Medication Sig Dispense Refill  . albuterol (PROVENTIL HFA;VENTOLIN HFA) 108 (90 BASE) MCG/ACT inhaler Inhale 2 puffs into the lungs every 6 (six) hours as needed for wheezing. Reported on 10/03/2015    . aspirin EC 81 MG tablet Take 81 mg by mouth daily.    Marland Kitchen atorvastatin (LIPITOR) 10 MG tablet Take 1 tablet by mouth daily.    . benztropine (COGENTIN) 1 MG tablet Take 1 tablet (1 mg total) by mouth at bedtime. 30 tablet 2  . BYDUREON BCISE 2 MG/0.85ML AUIJ Inject 2 mg into the skin. Once a week on Mondays    . CARVEDILOL PO Take 12.5 mg by mouth 2 (two) times daily.     Marland Kitchen dicyclomine (BENTYL) 10 MG capsule Take 1 capsule (10 mg total) by mouth 3 (three) times daily before meals. 90 capsule 2  . DULoxetine (CYMBALTA) 60 MG capsule Take 1 capsule (60 mg total) by mouth daily at 6 PM. 30 capsule 2  . famotidine (PEPCID) 20 MG tablet Take 1 tablet (20 mg total) by mouth 2 (two) times daily. 20 tablet 0  . gabapentin (NEURONTIN) 300 MG capsule Take 300 mg by mouth 3 (three) times daily.     . Insulin Glargine (TOUJEO MAX SOLOSTAR [place]) Inject 125 Units into the skin daily.    . insulin lispro (HUMALOG) 100 UNIT/ML injection  Inject 15 Units into the skin 3 (three) times daily before meals.     Marland Kitchen levothyroxine (SYNTHROID, LEVOTHROID) 25 MCG tablet Take 25 mcg by mouth daily before breakfast.     . LINZESS 145 MCG CAPS capsule Take 145 mcg by mouth daily before breakfast.     . lisinopril (PRINIVIL,ZESTRIL) 40 MG tablet Take 40 mg by mouth daily.    Marland Kitchen omeprazole (PRILOSEC) 20 MG capsule Take 1 capsule (20 mg total) by mouth daily before supper.    . ondansetron (ZOFRAN) 4 MG tablet Take 1 tablet (4 mg total) by mouth every 8 (eight) hours as needed. 6 tablet 0  . potassium chloride 20 MEQ TBCR Take 20 mEq by mouth 2 (two) times daily. 8 tablet 0  . risperiDONE (RISPERDAL) 2 MG tablet Take 2 tablets (4 mg total) by mouth at bedtime. 60 tablet 2  . traMADol (ULTRAM) 50 MG tablet Take 1 tablet (50 mg total) by mouth every 6 (six) hours as needed. 10 tablet 0  . traZODone (DESYREL) 100 MG tablet Take 2 tablets (200 mg total) by mouth at bedtime. 60 tablet 2  . Vitamin D, Ergocalciferol, (DRISDOL) 50000 units CAPS capsule Take 50,000 Units by mouth every Sunday.  No current facility-administered medications for this visit.      Past Surgical History:  Procedure Laterality Date  . BREAST SURGERY Right    biopsy  . CESAREAN SECTION    . TUBAL LIGATION     X3     No Known Allergies    Family History  Problem Relation Age of Onset  . Hypertension Mother   . Alcohol abuse Mother   . Hypertension Father   . Alcohol abuse Sister   . Stroke Other   . Diabetes Other   . Cancer Other   . Seizures Other   . Alcohol abuse Maternal Aunt   . Alcohol abuse Paternal Aunt   . Alcohol abuse Maternal Grandfather   . Alcohol abuse Maternal Grandmother   . Alcohol abuse Cousin      Social History Ms. Rolon reports that  has never smoked. she has never used smokeless tobacco. Ms. Trueheart reports that she does not drink alcohol.   Review of Systems CONSTITUTIONAL: No weight loss, fever, chills, weakness or  fatigue.  HEENT: Eyes: No visual loss, blurred vision, double vision or yellow sclerae.No hearing loss, sneezing, congestion, runny nose or sore throat.  SKIN: No rash or itching.  CARDIOVASCULAR:  RESPIRATORY: No shortness of breath, cough or sputum.  GASTROINTESTINAL: No anorexia, nausea, vomiting or diarrhea. No abdominal pain or blood.  GENITOURINARY: No burning on urination, no polyuria NEUROLOGICAL: No headache, dizziness, syncope, paralysis, ataxia, numbness or tingling in the extremities. No change in bowel or bladder control.  MUSCULOSKELETAL: No muscle, back pain, joint pain or stiffness.  LYMPHATICS: No enlarged nodes. No history of splenectomy.  PSYCHIATRIC: No history of depression or anxiety.  ENDOCRINOLOGIC: No reports of sweating, cold or heat intolerance. No polyuria or polydipsia.  Marland Kitchen   Physical Examination There were no vitals filed for this visit. There were no vitals filed for this visit.  Gen: resting comfortably, no acute distress HEENT: no scleral icterus, pupils equal round and reactive, no palptable cervical adenopathy,  CV Resp: Clear to auscultation bilaterally GI: abdomen is soft, non-tender, non-distended, normal bowel sounds, no hepatosplenomegaly MSK: extremities are warm, no edema.  Skin: warm, no rash Neuro:  no focal deficits Psych: appropriate affect   Diagnostic Studies  01/2017 echo Study Conclusions  - Left ventricle: The cavity size was normal. Wall thickness was   increased in a pattern of mild LVH. Systolic function was normal.   The estimated ejection fraction was in the range of 60% to 65%.   Wall motion was normal; there were no regional wall motion   abnormalities. - Right atrium: Central venous pressure (est): 3 mm Hg. - Atrial septum: No defect or patent foramen ovale was identified. - Tricuspid valve: There was trivial regurgitation. - Pulmonary arteries: Systolic pressure could not be accurately   estimated. - Pericardium,  extracardiac: A small pericardial effusion was   identified circumferential to the heart.  Impressions:  - Mild LVH with LVEF 60-65%. Indeterminate diastolic function.   Trivial tricuspid regurgitation. Small circumferential   pericardial effusion.   Assessment and Plan  1. Chest pain - we will iniitally order an echo to evaluate for structurla heart disease. Pending results consider ischemic testing  2. HTN - she is unsure of her meds - she will come for a nursing visit with all her pill bottles to review   F/u 1 month      Arnoldo Lenis, M.D.

## 2017-04-01 ENCOUNTER — Ambulatory Visit (INDEPENDENT_AMBULATORY_CARE_PROVIDER_SITE_OTHER): Payer: Medicare Other | Admitting: Cardiology

## 2017-04-01 ENCOUNTER — Encounter: Payer: Self-pay | Admitting: Cardiology

## 2017-04-01 DIAGNOSIS — R079 Chest pain, unspecified: Secondary | ICD-10-CM | POA: Diagnosis not present

## 2017-04-01 NOTE — Progress Notes (Signed)
Clinical Summary Kathryn [name] is a 46 y.o.female seen for follow up of the following medical problems.   1. Chest pain -started about 1 month ago. Sharp/pressure like pain left side, 10/10 in severity. Can occur at rest or with exertion. Can feel SOB, hot, sweaty, nauseous. Lasts 5-10 seconds, occurs daily. Can be worst with position - +DOE which is new, example doing laundry causes symptoms - can have some occasoinal LE edema, abdominal swelling. 3 pillow orthopnea x 1 month  CAD risk factors: HL, HTN, DM2,  - 01/2017 echo LVEF 60-65%, no WMAs - since last visit symptoms continue. Seem to be more associated with positioning - neuropathy in legs, cannot run on treadmill     Past Medical History:  Diagnosis Date  . Anemia   . Anxiety   . Bipolar disorder ([place])   . Depression   . Diabetes mellitus   . Hypertension   . Schizoaffective disorder ([place])      No Known Allergies   Current Outpatient Medications  Medication Sig Dispense Refill  . albuterol (PROVENTIL HFA;VENTOLIN HFA) 108 (90 BASE) MCG/ACT inhaler Inhale 2 puffs into the lungs every 6 (six) hours as needed for wheezing. Reported on 10/03/2015    . aspirin EC 81 MG tablet Take 81 mg by mouth daily.    Marland Kitchen atorvastatin (LIPITOR) 10 MG tablet Take 1 tablet by mouth daily.    . benztropine (COGENTIN) 1 MG tablet Take 1 tablet (1 mg total) by mouth at bedtime. 30 tablet 2  . BYDUREON BCISE 2 MG/0.85ML AUIJ Inject 2 mg into the skin. Once a week on Mondays    . CARVEDILOL PO Take 12.5 mg by mouth 2 (two) times daily.     Marland Kitchen dicyclomine (BENTYL) 10 MG capsule Take 1 capsule (10 mg total) by mouth 3 (three) times daily before meals. 90 capsule 2  . DULoxetine (CYMBALTA) 60 MG capsule Take 1 capsule (60 mg total) by mouth daily at 6 PM. 30 capsule 2  . famotidine (PEPCID) 20 MG tablet Take 1 tablet (20 mg total) by mouth 2 (two) times daily. 20 tablet 0  . gabapentin (NEURONTIN) 300 MG capsule Take 300 mg by mouth 3  (three) times daily.     . Insulin Glargine (TOUJEO MAX SOLOSTAR [place]) Inject 125 Units into the skin daily.    . insulin lispro (HUMALOG) 100 UNIT/ML injection Inject 15 Units into the skin 3 (three) times daily before meals.     Marland Kitchen levothyroxine (SYNTHROID, LEVOTHROID) 25 MCG tablet Take 25 mcg by mouth daily before breakfast.     . LINZESS 145 MCG CAPS capsule Take 145 mcg by mouth daily before breakfast.     . lisinopril (PRINIVIL,ZESTRIL) 40 MG tablet Take 40 mg by mouth daily.    Marland Kitchen omeprazole (PRILOSEC) 20 MG capsule Take 1 capsule (20 mg total) by mouth daily before supper.    . ondansetron (ZOFRAN) 4 MG tablet Take 1 tablet (4 mg total) by mouth every 8 (eight) hours as needed. 6 tablet 0  . potassium chloride 20 MEQ TBCR Take 20 mEq by mouth 2 (two) times daily. 8 tablet 0  . risperiDONE (RISPERDAL) 2 MG tablet Take 2 tablets (4 mg total) by mouth at bedtime. 60 tablet 2  . traMADol (ULTRAM) 50 MG tablet Take 1 tablet (50 mg total) by mouth every 6 (six) hours as needed. 10 tablet 0  . traZODone (DESYREL) 100 MG tablet Take 2 tablets (200 mg total) by mouth  at bedtime. 60 tablet 2  . Vitamin D, Ergocalciferol, (DRISDOL) 50000 units CAPS capsule Take 50,000 Units by mouth every Sunday.      No current facility-administered medications for this visit.      Past Surgical History:  Procedure Laterality Date  . BREAST SURGERY Right    biopsy  . CESAREAN SECTION    . TUBAL LIGATION     X3     No Known Allergies    Family History  Problem Relation Age of Onset  . Hypertension Mother   . Alcohol abuse Mother   . Hypertension Father   . Alcohol abuse Sister   . Stroke Other   . Diabetes Other   . Cancer Other   . Seizures Other   . Alcohol abuse Maternal Aunt   . Alcohol abuse Paternal Aunt   . Alcohol abuse Maternal Grandfather   . Alcohol abuse Maternal Grandmother   . Alcohol abuse Cousin      Social History Kathryn [name] reports that  has never smoked. she has never  used smokeless tobacco. Kathryn [name] reports that she does not drink alcohol.   Review of Systems CONSTITUTIONAL: No weight loss, fever, chills, weakness or fatigue.  HEENT: Eyes: No visual loss, blurred vision, double vision or yellow sclerae.No hearing loss, sneezing, congestion, runny nose or sore throat.  SKIN: No rash or itching.  CARDIOVASCULAR: per hpi RESPIRATORY: No shortness of breath, cough or sputum.  GASTROINTESTINAL: No anorexia, nausea, vomiting or diarrhea. No abdominal pain or blood.  GENITOURINARY: No burning on urination, no polyuria NEUROLOGICAL: No headache, dizziness, syncope, paralysis, ataxia, numbness or tingling in the extremities. No change in bowel or bladder control.  MUSCULOSKELETAL: No muscle, back pain, joint pain or stiffness.  LYMPHATICS: No enlarged nodes. No history of splenectomy.  PSYCHIATRIC: No history of depression or anxiety.  ENDOCRINOLOGIC: No reports of sweating, cold or heat intolerance. No polyuria or polydipsia.  Marland Kitchen   Physical Examination Vitals:   04/01/17 1540  BP: 134/80  Pulse: 94  SpO2: 96%   Vitals:   04/01/17 1540  Weight: 185 lb (83.9 kg)  Height: 5\' 2"  (1.575 m)    Gen: resting comfortably, no acute distress HEENT: no scleral icterus, pupils equal round and reactive, no palptable cervical adenopathy,  CV: RRR, no m/r/g, no jvd Resp: Clear to auscultation bilaterally GI: abdomen is soft, non-tender, non-distended, normal bowel sounds, no hepatosplenomegaly MSK: extremities are warm, no edema.  Skin: warm, no rash Neuro:  no focal deficits Psych: appropriate affect   Diagnostic Studies 01/2017 echo Study Conclusions  - Left ventricle: The cavity size was normal. Wall thickness was increased in a pattern of mild LVH. Systolic function was normal. The estimated ejection fraction was in the range of 60% to 65%. Wall motion was normal; there were no regional wall motion abnormalities. - Right atrium: Central  venous pressure (est): 3 mm Hg. - Atrial septum: No defect or patent foramen ovale was identified. - Tricuspid valve: There was trivial regurgitation. - Pulmonary arteries: Systolic pressure could not be accurately estimated. - Pericardium, extracardiac: A small pericardial effusion was identified circumferential to the heart.  Impressions:  - Mild LVH with LVEF 60-65%. Indeterminate diastolic function. Trivial tricuspid regurgitation. Small circumferential pericardial effusion.       Assessment and Plan  1. Chest pain - ongoing symptoms, though somewhat atypical. She is a diabetic and high risk for atypical angina - we will plan for a lexiscan to further evaluate   F/u  pending results       Arnoldo Lenis, M.D., F.A.C.C.

## 2017-04-01 NOTE — Patient Instructions (Signed)
Medication Instructions:  Your physician recommends that you continue on your current medications as directed. Please refer to the Current Medication list given to you today.   Labwork: none  Testing/Procedures: Your physician has requested that you have a lexiscan myoview. For further information please visit [email or web address]. Please follow instruction sheet, as given.    Follow-Up: Your physician recommends that you schedule a follow-up appointment in: pending test results    Any Other Special Instructions Will Be Listed Below (If Applicable).     If you need a refill on your cardiac medications before your next appointment, please call your pharmacy.   

## 2017-04-02 ENCOUNTER — Other Ambulatory Visit: Payer: Self-pay

## 2017-04-02 NOTE — Patient Outreach (Signed)
[facility]) Care Management  [place]  04/02/2017   Kathryn [name] 07-12-1970 [id number]  Subjective: Telephone call to patient for case closure.  Patient reports she is doing good.  She reports that she will be getting some of her teeth pulled soon.  Advised patient on diet and sugar control related to dental work.  She verbalized understanding. Patient reports that her sugars are in the 100's and she is happy about that.  Advised patient to continue to strive to keep sugars controlled through diet and medications.  Also discussed case closure.  Patient verbalized understanding  Objective:   Encounter Medications:  Outpatient Encounter Medications as of 04/02/2017  Medication Sig Note  . albuterol (PROVENTIL HFA;VENTOLIN HFA) 108 (90 BASE) MCG/ACT inhaler Inhale 2 puffs into the lungs every 6 (six) hours as needed for wheezing. Reported on 10/03/2015   . aspirin EC 81 MG tablet Take 81 mg by mouth daily.   Marland Kitchen atorvastatin (LIPITOR) 10 MG tablet Take 1 tablet by mouth daily. 08/28/2016: Patient taking  . benztropine (COGENTIN) 1 MG tablet Take 1 tablet (1 mg total) by mouth at bedtime.   Marland Kitchen BYDUREON BCISE 2 MG/0.85ML AUIJ Inject 2 mg into the skin. Once a week on Mondays   . CARVEDILOL PO Take 12.5 mg by mouth 2 (two) times daily.    Marland Kitchen dicyclomine (BENTYL) 10 MG capsule Take 1 capsule (10 mg total) by mouth 3 (three) times daily before meals.   . DULoxetine (CYMBALTA) 60 MG capsule Take 1 capsule (60 mg total) by mouth daily at 6 PM.   . famotidine (PEPCID) 20 MG tablet Take 1 tablet (20 mg total) by mouth 2 (two) times daily.   Marland Kitchen gabapentin (NEURONTIN) 300 MG capsule Take 300 mg by mouth 3 (three) times daily.    . Insulin Glargine (TOUJEO MAX SOLOSTAR [place]) Inject 125 Units into the skin daily.   . insulin lispro (HUMALOG) 100 UNIT/ML injection Inject 15 Units into the skin 3 (three) times daily before meals.    Marland Kitchen levothyroxine (SYNTHROID, LEVOTHROID) 25 MCG tablet  Take 25 mcg by mouth daily before breakfast.    . LINZESS 145 MCG CAPS capsule Take 145 mcg by mouth daily before breakfast.    . losartan (COZAAR) 25 MG tablet Take 25 mg daily by mouth.   Marland Kitchen omeprazole (PRILOSEC) 20 MG capsule Take 1 capsule (20 mg total) by mouth daily before supper.   . ondansetron (ZOFRAN) 4 MG tablet Take 1 tablet (4 mg total) by mouth every 8 (eight) hours as needed.   . potassium chloride 20 MEQ TBCR Take 20 mEq by mouth 2 (two) times daily.   . risperiDONE (RISPERDAL) 2 MG tablet Take 2 tablets (4 mg total) by mouth at bedtime.   . traMADol (ULTRAM) 50 MG tablet Take 1 tablet (50 mg total) by mouth every 6 (six) hours as needed.   . traZODone (DESYREL) 100 MG tablet Take 2 tablets (200 mg total) by mouth at bedtime.   . Vitamin D, Ergocalciferol, (DRISDOL) 50000 units CAPS capsule Take 50,000 Units by mouth every Sunday.     No facility-administered encounter medications on file as of 04/02/2017.     Functional Status:  No flowsheet data found.  Fall/Depression Screening: Fall Risk  02/04/2017 01/07/2017 12/12/2016  Falls in the past year? No No No  Risk for fall due to : - - -   PHQ 2/9 Scores 03/03/2017 02/04/2017 01/07/2017 12/12/2016 11/14/2016 09/23/2016 08/28/2016  PHQ -  2 Score 0 0 _0 0 0  PHQ- 9 Score - - - - - - -    Assessment: Patient is meeting goals of care and ready for case closure  Plan:  [facility] CM Care Plan Problem Two     Most Recent Value  Care Plan Problem Two  knowledge deficit related to diabetes as evidenced by questions around diabetes from patient  Role Documenting the Problem [place] for Problem Two  Active  Interventions for Problem Two Long Term Goal   Patient able to manage diet and blood sugars.  [facility] Long Term Goal  Patient will have knowledge of diabetic diet over the next 90 days  [facility] Long Term Goal Start Date  03/03/17  [facility] Long Term Goal Met Date  04/02/17     RN CM will close case as patient has met goals of  care and notify care management assistant of case status.   Jone Baseman, RN, MSN [facility] Care Management Care Management Coordinator Direct Line [phone] Toll Free: [phone]  Fax: [phone] '

## 2017-04-06 ENCOUNTER — Encounter: Payer: Self-pay | Admitting: Cardiology

## 2017-04-09 ENCOUNTER — Encounter (HOSPITAL_BASED_OUTPATIENT_CLINIC_OR_DEPARTMENT_OTHER)
Admission: RE | Admit: 2017-04-09 | Discharge: 2017-04-09 | Disposition: A | Discharge status: Home / Self Care | Payer: Medicare Other | Source: Ambulatory Visit | Attending: Cardiology | Admitting: Cardiology

## 2017-04-09 ENCOUNTER — Ambulatory Visit (INDEPENDENT_AMBULATORY_CARE_PROVIDER_SITE_OTHER): Payer: Self-pay | Admitting: Internal Medicine

## 2017-04-09 ENCOUNTER — Encounter (HOSPITAL_COMMUNITY): Payer: Self-pay

## 2017-04-09 ENCOUNTER — Encounter (HOSPITAL_COMMUNITY)
Admission: RE | Admit: 2017-04-09 | Discharge: 2017-04-09 | Disposition: A | Discharge status: Home / Self Care | Payer: Medicare Other | Source: Ambulatory Visit | Attending: Cardiology | Admitting: Cardiology

## 2017-04-09 DIAGNOSIS — R079 Chest pain, unspecified: Secondary | ICD-10-CM | POA: Diagnosis not present

## 2017-04-09 HISTORY — DX: Unspecified asthma, uncomplicated: J45.909

## 2017-04-09 LAB — NM MYOCAR MULTI W/SPECT W/WALL MOTION / EF
LV dias vol: 72 mL (ref 46–106)
LV sys vol: 27 mL
Peak HR: 105 {beats}/min
RATE: 0.36
Rest HR: 66 {beats}/min
SDS: 0
SRS: 1
SSS: 1
TID: 1.03

## 2017-04-09 MED ORDER — SODIUM CHLORIDE 0.9% FLUSH
INTRAVENOUS | Status: AC
  Administered 2017-04-09: 10 mL via INTRAVENOUS
  Filled 2017-04-09: qty 10

## 2017-04-09 MED ORDER — TECHNETIUM TC 99M TETROFOSMIN IV KIT
10.0000 | PACK | Freq: Once | INTRAVENOUS | Status: AC | PRN
  Administered 2017-04-09: 10.4 via INTRAVENOUS

## 2017-04-09 MED ORDER — REGADENOSON 0.4 MG/5ML IV SOLN
INTRAVENOUS | Status: AC
  Administered 2017-04-09: 0.4 mg via INTRAVENOUS
  Filled 2017-04-09: qty 5

## 2017-04-09 MED ORDER — TECHNETIUM TC 99M TETROFOSMIN IV KIT
30.0000 | PACK | Freq: Once | INTRAVENOUS | Status: AC | PRN
  Administered 2017-04-09: 31.5 via INTRAVENOUS

## 2017-04-10 ENCOUNTER — Ambulatory Visit (INDEPENDENT_AMBULATORY_CARE_PROVIDER_SITE_OTHER): Payer: Medicare Other | Admitting: Psychiatry

## 2017-04-10 ENCOUNTER — Encounter (HOSPITAL_COMMUNITY): Payer: Self-pay | Admitting: Psychiatry

## 2017-04-10 VITALS — BP 128/92 | HR 92 | Ht 62.0 in | Wt 182.0 lb

## 2017-04-10 DIAGNOSIS — Z62810 Personal history of physical and sexual abuse in childhood: Secondary | ICD-10-CM

## 2017-04-10 DIAGNOSIS — F4310 Post-traumatic stress disorder, unspecified: Secondary | ICD-10-CM

## 2017-04-10 DIAGNOSIS — F251 Schizoaffective disorder, depressive type: Secondary | ICD-10-CM | POA: Diagnosis not present

## 2017-04-10 DIAGNOSIS — Z736 Limitation of activities due to disability: Secondary | ICD-10-CM

## 2017-04-10 DIAGNOSIS — Z811 Family history of alcohol abuse and dependence: Secondary | ICD-10-CM

## 2017-04-10 DIAGNOSIS — R443 Hallucinations, unspecified: Secondary | ICD-10-CM | POA: Diagnosis not present

## 2017-04-10 MED ORDER — DULOXETINE HCL 60 MG PO CPEP: 60.0000 mg | ORAL_CAPSULE | Freq: Every day | ORAL | 2 refills | Status: DC

## 2017-04-10 MED ORDER — TRAZODONE HCL 100 MG PO TABS: 200.0000 mg | ORAL_TABLET | Freq: Every day | ORAL | 2 refills | Status: DC

## 2017-04-10 MED ORDER — BENZTROPINE MESYLATE 1 MG PO TABS: 1.0000 mg | ORAL_TABLET | Freq: Every day | ORAL | 2 refills | Status: DC

## 2017-04-10 MED ORDER — RISPERIDONE 2 MG PO TABS: 4.0000 mg | ORAL_TABLET | Freq: Every day | ORAL | 2 refills | Status: DC
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
## 2017-04-14 ENCOUNTER — Ambulatory Visit (INDEPENDENT_AMBULATORY_CARE_PROVIDER_SITE_OTHER): Payer: Self-pay | Admitting: Internal Medicine

## 2017-04-14 ENCOUNTER — Encounter (INDEPENDENT_AMBULATORY_CARE_PROVIDER_SITE_OTHER): Payer: Self-pay | Admitting: Internal Medicine

## 2017-04-30 ENCOUNTER — Ambulatory Visit (INDEPENDENT_AMBULATORY_CARE_PROVIDER_SITE_OTHER): Payer: Self-pay | Admitting: Internal Medicine

## 2017-05-26 HISTORY — PX: OTHER SURGICAL HISTORY: SHX169

## 2017-07-10 ENCOUNTER — Ambulatory Visit (HOSPITAL_COMMUNITY): Payer: Self-pay | Admitting: Psychiatry

## 2017-07-10 ENCOUNTER — Other Ambulatory Visit: Payer: Medicare HMO | Admitting: Obstetrics & Gynecology

## 2017-07-15 ENCOUNTER — Ambulatory Visit (HOSPITAL_COMMUNITY): Payer: Self-pay | Admitting: Psychiatry

## 2017-07-21 ENCOUNTER — Ambulatory Visit (INDEPENDENT_AMBULATORY_CARE_PROVIDER_SITE_OTHER): Payer: Medicare Other | Admitting: Psychiatry

## 2017-07-21 ENCOUNTER — Encounter (HOSPITAL_COMMUNITY): Payer: Self-pay | Admitting: Psychiatry

## 2017-07-21 VITALS — BP 170/111 | HR 76 | Ht 62.0 in | Wt 180.0 lb

## 2017-07-21 DIAGNOSIS — Z736 Limitation of activities due to disability: Secondary | ICD-10-CM | POA: Diagnosis not present

## 2017-07-21 DIAGNOSIS — M549 Dorsalgia, unspecified: Secondary | ICD-10-CM

## 2017-07-21 DIAGNOSIS — Z811 Family history of alcohol abuse and dependence: Secondary | ICD-10-CM | POA: Diagnosis not present

## 2017-07-21 DIAGNOSIS — Z915 Personal history of self-harm: Secondary | ICD-10-CM

## 2017-07-21 DIAGNOSIS — F251 Schizoaffective disorder, depressive type: Secondary | ICD-10-CM | POA: Diagnosis not present

## 2017-07-21 DIAGNOSIS — F4310 Post-traumatic stress disorder, unspecified: Secondary | ICD-10-CM

## 2017-07-21 MED ORDER — TRAZODONE HCL 100 MG PO TABS: 200.0000 mg | ORAL_TABLET | Freq: Every day | ORAL | 2 refills | Status: DC

## 2017-07-21 MED ORDER — DULOXETINE HCL 60 MG PO CPEP: 60.0000 mg | ORAL_CAPSULE | Freq: Every day | ORAL | 2 refills | Status: DC

## 2017-07-21 MED ORDER — RISPERIDONE 2 MG PO TABS: 4.0000 mg | ORAL_TABLET | Freq: Every day | ORAL | 2 refills | Status: DC

## 2017-07-21 MED ORDER — BENZTROPINE MESYLATE 1 MG PO TABS: 1.0000 mg | ORAL_TABLET | Freq: Every day | ORAL | 2 refills | Status: DC
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
## 2017-08-03 ENCOUNTER — Ambulatory Visit (INDEPENDENT_AMBULATORY_CARE_PROVIDER_SITE_OTHER): Payer: Medicare Other | Admitting: Obstetrics & Gynecology

## 2017-08-03 ENCOUNTER — Other Ambulatory Visit: Payer: Self-pay

## 2017-08-03 ENCOUNTER — Other Ambulatory Visit (HOSPITAL_COMMUNITY)
Admission: RE | Admit: 2017-08-03 | Discharge: 2017-08-03 | Disposition: A | Discharge status: Home / Self Care | Payer: Medicare Other | Source: Ambulatory Visit | Attending: Obstetrics & Gynecology | Admitting: Obstetrics & Gynecology

## 2017-08-03 ENCOUNTER — Encounter: Payer: Self-pay | Admitting: Obstetrics & Gynecology

## 2017-08-03 VITALS — BP 158/90 | HR 88 | Ht 62.0 in | Wt 180.0 lb

## 2017-08-03 DIAGNOSIS — Z124 Encounter for screening for malignant neoplasm of cervix: Secondary | ICD-10-CM | POA: Diagnosis present

## 2017-08-03 DIAGNOSIS — Z01419 Encounter for gynecological examination (general) (routine) without abnormal findings: Secondary | ICD-10-CM

## 2017-08-03 NOTE — Progress Notes (Signed)
Subjective:     Kathryn [name] is a 47 y.o. female here for a routine exam.  Patient's last menstrual period was 01/23/2013. [id number] Birth Control Method:  BTL Menstrual Calendar(currently): menopausal  Current complaints: none.   Current acute medical issues:  Diabetes, Bipolar   Recent Gynecologic History Patient's last menstrual period was 01/23/2013. Last Pap: 2015,  Normal---> today Last mammogram: 8/18,  normal  Past Medical History:  Diagnosis Date  . Anemia   . Anxiety   . Asthma   . Bipolar disorder ([place])   . Depression   . Diabetes mellitus   . Hypertension   . Schizoaffective disorder [facility])     Past Surgical History:  Procedure Laterality Date  . BREAST SURGERY Right    biopsy  . CESAREAN SECTION    . CHOLECYSTECTOMY  06/02/2012   Procedure: LAPAROSCOPIC CHOLECYSTECTOMY;  Surgeon: Jamesetta So, MD;  Location: [facility] ORS;  Service: General;  Laterality: N/A;  Attempted laparoscopic cholecystectomy  . CHOLECYSTECTOMY  06/02/2012   Procedure: CHOLECYSTECTOMY;  Surgeon: Jamesetta So, MD;  Location: [facility] ORS;  Service: General;  Laterality: N/A;  converted to open at  0905  . ESOPHAGOGASTRODUODENOSCOPY (EGD) WITH PROPOFOL N/A 08/24/2015   Procedure: ESOPHAGOGASTRODUODENOSCOPY (EGD) WITH PROPOFOL;  Surgeon: Rogene Houston, MD;  Location: [facility] END[facility];  Service: Endoscopy;  Laterality: N/A;  1:10 - Ann to notify pt to arrive at 11:30  . TUBAL LIGATION     X3    OB History    Gravida Para Term Preterm AB Living   3 3 3     3    SAB TAB Ectopic Multiple Live Births                  Social History   Socioeconomic History  . Marital status: Married    Spouse name: None  . Number of children: None  . Years of education: None  . Highest education level: None  Social Needs  . Financial resource strain: None  . Food insecurity - worry: None  . Food insecurity - inability: None  . Transportation needs - medical: None  . Transportation needs - non-medical: None   Occupational History  . None  Tobacco Use  . Smoking status: Never Smoker  . Smokeless tobacco: Never Used  Substance and Sexual Activity  . Alcohol use: No    Alcohol/week: 0.0 oz  . Drug use: No  . Sexual activity: Not Currently    Birth control/protection: Surgical  Other Topics Concern  . None  Social History Narrative  . None    Family History  Problem Relation Age of Onset  . Hypertension Mother   . Alcohol abuse Mother   . Hypertension Father   . Alcohol abuse Sister   . Stroke Other   . Diabetes Other   . Cancer Other   . Seizures Other   . Alcohol abuse Maternal Aunt   . Alcohol abuse Paternal Aunt   . Alcohol abuse Maternal Grandfather   . Alcohol abuse Maternal Grandmother   . Alcohol abuse Cousin      Current Outpatient Medications:  .  albuterol (PROVENTIL HFA;VENTOLIN HFA) 108 (90 BASE) MCG/ACT inhaler, Inhale 2 puffs into the lungs every 6 (six) hours as needed for wheezing. Reported on 10/03/2015, Disp: , Rfl:  .  aspirin EC 81 MG tablet, Take 81 mg by mouth daily., Disp: , Rfl:  .  atorvastatin (LIPITOR) 10 MG tablet, Take 1 tablet by mouth daily., Disp: ,  Rfl:  .  benztropine (COGENTIN) 1 MG tablet, Take 1 tablet (1 mg total) by mouth at bedtime., Disp: 30 tablet, Rfl: 2 .  BYDUREON BCISE 2 MG/0.85ML AUIJ, Inject 2 mg into the skin. Once a week on Mondays, Disp: , Rfl:  .  CARVEDILOL PO, Take 12.5 mg by mouth 2 (two) times daily. , Disp: , Rfl:  .  dicyclomine (BENTYL) 10 MG capsule, Take 1 capsule (10 mg total) by mouth 3 (three) times daily before meals., Disp: 90 capsule, Rfl: 2 .  DULoxetine (CYMBALTA) 60 MG capsule, Take 1 capsule (60 mg total) by mouth daily at 6 PM., Disp: 30 capsule, Rfl: 2 .  famotidine (PEPCID) 20 MG tablet, Take 1 tablet (20 mg total) by mouth 2 (two) times daily., Disp: 20 tablet, Rfl: 0 .  gabapentin (NEURONTIN) 300 MG capsule, Take 300 mg by mouth 3 (three) times daily. , Disp: , Rfl:  .  Insulin Glargine (TOUJEO MAX  SOLOSTAR [place]), Inject 150 Units into the skin daily. , Disp: , Rfl:  .  insulin lispro (HUMALOG) 100 UNIT/ML injection, Inject 15 Units into the skin 3 (three) times daily before meals. , Disp: , Rfl:  .  levothyroxine (SYNTHROID, LEVOTHROID) 25 MCG tablet, Take 25 mcg by mouth daily before breakfast. , Disp: , Rfl:  .  LINZESS 145 MCG CAPS capsule, Take 145 mcg by mouth daily before breakfast. , Disp: , Rfl:  .  losartan (COZAAR) 25 MG tablet, Take 25 mg daily by mouth., Disp: , Rfl:  .  omeprazole (PRILOSEC) 20 MG capsule, Take 1 capsule (20 mg total) by mouth daily before supper., Disp: , Rfl:  .  ondansetron (ZOFRAN) 4 MG tablet, Take 1 tablet (4 mg total) by mouth every 8 (eight) hours as needed., Disp: 6 tablet, Rfl: 0 .  potassium chloride 20 MEQ TBCR, Take 20 mEq by mouth 2 (two) times daily., Disp: 8 tablet, Rfl: 0 .  risperiDONE (RISPERDAL) 2 MG tablet, Take 2 tablets (4 mg total) by mouth at bedtime., Disp: 60 tablet, Rfl: 2 .  traMADol (ULTRAM) 50 MG tablet, Take 1 tablet (50 mg total) by mouth every 6 (six) hours as needed., Disp: 10 tablet, Rfl: 0 .  traZODone (DESYREL) 100 MG tablet, Take 2 tablets (200 mg total) by mouth at bedtime., Disp: 60 tablet, Rfl: 2 .  Vitamin D, Ergocalciferol, (DRISDOL) 50000 units CAPS capsule, Take 50,000 Units by mouth every Sunday. , Disp: , Rfl:   Review of Systems  Review of Systems  Constitutional: Negative for fever, chills, weight loss, malaise/fatigue and diaphoresis.  HENT: Negative for hearing loss, ear pain, nosebleeds, congestion, sore throat, neck pain, tinnitus and ear discharge.   Eyes: Negative for blurred vision, double vision, photophobia, pain, discharge and redness.  Respiratory: Negative for cough, hemoptysis, sputum production, shortness of breath, wheezing and stridor.   Cardiovascular: Negative for chest pain, palpitations, orthopnea, claudication, leg swelling and PND.  Gastrointestinal: negative for abdominal pain. Negative  for heartburn, nausea, vomiting, diarrhea, constipation, blood in stool and melena.  Genitourinary: Negative for dysuria, urgency, frequency, hematuria and flank pain.  Musculoskeletal: Negative for myalgias, back pain, joint pain and falls.  Skin: Negative for itching and rash.  Neurological: Negative for dizziness, tingling, tremors, sensory change, speech change, focal weakness, seizures, loss of consciousness, weakness and headaches.  Endo/Heme/Allergies: Negative for environmental allergies and polydipsia. Does not bruise/bleed easily.  Psychiatric/Behavioral: Negative for depression, suicidal ideas, hallucinations, memory loss and substance abuse. The patient is not  nervous/anxious and does not have insomnia.        Objective:  Blood pressure (!) 158/90, pulse 88, height 5\' 2"  (1.575 m), weight 180 lb (81.6 kg), last menstrual period 01/23/2013.   Physical Exam  Vitals reviewed. Constitutional: She is oriented to person, place, and time. She appears well-developed and well-nourished.  HENT:  Head: Normocephalic and atraumatic.        Right Ear: External ear normal.  Left Ear: External ear normal.  Nose: Nose normal.  Mouth/Throat: Oropharynx is clear and moist.  Eyes: Conjunctivae and EOM are normal. Pupils are equal, round, and reactive to light. Right eye exhibits no discharge. Left eye exhibits no discharge. No scleral icterus.  Neck: Normal range of motion. Neck supple. No tracheal deviation present. No thyromegaly present.  Cardiovascular: Normal rate, regular rhythm, normal heart sounds and intact distal pulses.  Exam reveals no gallop and no friction rub.   No murmur heard. Respiratory: Effort normal and breath sounds normal. No respiratory distress. She has no wheezes. She has no rales. She exhibits no tenderness.  GI: Soft. Bowel sounds are normal. She exhibits no distension and no mass. There is no tenderness. There is no rebound and no guarding.  Genitourinary:  Breasts  no masses skin changes or nipple changes bilaterally      Vulva is normal without lesions Vagina is pink moist without discharge Cervix normal in appearance and pap is done Uterus is normal size shape and contour Adnexa is negative with normal sized ovaries   Musculoskeletal: Normal range of motion. She exhibits no edema and no tenderness.  Neurological: She is alert and oriented to person, place, and time. She has normal reflexes. She displays normal reflexes. No cranial nerve deficit. She exhibits normal muscle tone. Coordination normal.  Skin: Skin is warm and dry. No rash noted. No erythema. No pallor.  Psychiatric: She has a normal mood and affect. Her behavior is normal. Judgment and thought content normal.       Medications Ordered at today's visit: No orders of the defined types were placed in this encounter.   Other orders placed at today's visit: No orders of the defined types were placed in this encounter.     Assessment:    Healthy female exam.    Plan:    Contraception: tubal ligation. Mammogram ordered. Follow up in: 3 years.     Return in about 3 years (around 08/03/2020) for yearly, with Dr Elonda Husky.

## 2017-08-04 LAB — CYTOLOGY - PAP
Diagnosis: NEGATIVE
HPV: NOT DETECTED

## 2017-09-02 ENCOUNTER — Other Ambulatory Visit (INDEPENDENT_AMBULATORY_CARE_PROVIDER_SITE_OTHER): Payer: Self-pay | Admitting: Internal Medicine

## 2017-09-02 DIAGNOSIS — R1013 Epigastric pain: Secondary | ICD-10-CM

## 2017-09-23 ENCOUNTER — Ambulatory Visit: Payer: Self-pay | Admitting: Cardiology

## 2017-09-24 ENCOUNTER — Emergency Department (HOSPITAL_COMMUNITY)
Admission: EM | Admit: 2017-09-24 | Discharge: 2017-09-24 | Disposition: A | Discharge status: Home / Self Care | Payer: Medicare Other | Attending: Emergency Medicine | Admitting: Emergency Medicine

## 2017-09-24 ENCOUNTER — Other Ambulatory Visit: Payer: Self-pay

## 2017-09-24 ENCOUNTER — Encounter (HOSPITAL_COMMUNITY): Payer: Self-pay | Admitting: Emergency Medicine

## 2017-09-24 DIAGNOSIS — R05 Cough: Secondary | ICD-10-CM | POA: Diagnosis present

## 2017-09-24 DIAGNOSIS — Z794 Long term (current) use of insulin: Secondary | ICD-10-CM | POA: Diagnosis not present

## 2017-09-24 DIAGNOSIS — I10 Essential (primary) hypertension: Secondary | ICD-10-CM | POA: Insufficient documentation

## 2017-09-24 DIAGNOSIS — E119 Type 2 diabetes mellitus without complications: Secondary | ICD-10-CM | POA: Insufficient documentation

## 2017-09-24 DIAGNOSIS — J069 Acute upper respiratory infection, unspecified: Secondary | ICD-10-CM | POA: Insufficient documentation

## 2017-09-24 DIAGNOSIS — Z79899 Other long term (current) drug therapy: Secondary | ICD-10-CM | POA: Insufficient documentation

## 2017-09-24 DIAGNOSIS — Z7982 Long term (current) use of aspirin: Secondary | ICD-10-CM | POA: Diagnosis not present

## 2017-09-24 DIAGNOSIS — J45909 Unspecified asthma, uncomplicated: Secondary | ICD-10-CM | POA: Diagnosis not present

## 2017-09-24 LAB — GROUP A STREP BY PCR: Group A Strep by PCR: NOT DETECTED

## 2017-09-24 LAB — CBG MONITORING, ED: Glucose-Capillary: 115 mg/dL — ABNORMAL HIGH (ref 65–99)

## 2017-09-24 MED ORDER — ALBUTEROL SULFATE HFA 108 (90 BASE) MCG/ACT IN AERS: 1.0000 | INHALATION_SPRAY | Freq: Four times a day (QID) | RESPIRATORY_TRACT | 0 refills | Status: DC | PRN

## 2017-09-24 MED ORDER — IPRATROPIUM-ALBUTEROL 0.5-2.5 (3) MG/3ML IN SOLN
3.0000 mL | Freq: Once | RESPIRATORY_TRACT | Status: AC
  Administered 2017-09-24: 3 mL via RESPIRATORY_TRACT
  Filled 2017-09-24: qty 3

## 2017-09-24 MED ORDER — MAGIC MOUTHWASH W/LIDOCAINE: 5.0000 mL | Freq: Three times a day (TID) | ORAL | 0 refills | Status: DC | PRN

## 2017-09-24 MED ORDER — BENZONATATE 200 MG PO CAPS: 200.0000 mg | ORAL_CAPSULE | Freq: Three times a day (TID) | ORAL | 0 refills | Status: DC | PRN

## 2017-09-24 NOTE — ED Triage Notes (Signed)
PT c/o sinus pressure with productive white sputum cough, sore throat, bilateral ear pressure x4 days.

## 2017-09-24 NOTE — ED Provider Notes (Signed)
[facility] EMERGENCY DEPARTMENT Provider Note   CSN: [id number] Arrival date & time: 09/24/17  0856     History   Chief Complaint Chief Complaint  Patient presents with  . Cough    HPI Kathryn [name] is a 47 y.o. female.  HPI   Kathryn [name] is a 47 y.o. female who presents to the Emergency Department complaining of productive cough, nasal congestion, bilateral ear pain and sore throat.  Symptoms present for 4 days.  She states the cough began first, then she developed the sore throat and congestion with sinus pressure and clear rhinnorhea.  Cough is productive of clear to white sputum and seems to be worse at night keeping her from sleeping through the night.  She has been taking benadryl without relief.  She denies shortness of breath, chest pain, fever, chills, abd pain, vomiting or wheezing although she has been using an albuterol inhaler as needed, but has recently ran out.      Past Medical History:  Diagnosis Date  . Anemia   . Anxiety   . Asthma   . Bipolar disorder ([place])   . Depression   . Diabetes mellitus   . Hypertension   . Schizoaffective disorder [facility])     Patient Active Problem List   Diagnosis Date Noted  . GERD (gastroesophageal reflux disease) 10/08/2015  . IBS (irritable colon syndrome) 10/08/2015  . Agitation 09/07/2015  . Lithium toxicity 09/07/2015  . Altered mental status   . Elevated liver enzymes 09/06/2015  . AKI (acute kidney injury) ([place]) 09/06/2015  . Diabetes ([place]) 08/22/2015  . Essential hypertension 08/22/2015  . Schizoaffective disorder ([place]) 02/08/2015  . PTSD (post-traumatic stress disorder) 02/08/2015  . Acute low back pain due to trauma 09/21/2012    Past Surgical History:  Procedure Laterality Date  . BREAST SURGERY Right    biopsy  . CESAREAN SECTION    . CHOLECYSTECTOMY  06/02/2012   Procedure: LAPAROSCOPIC CHOLECYSTECTOMY;  Surgeon: Jamesetta So, MD;  Location: [facility] ORS;  Service: General;  Laterality: N/A;  Attempted  laparoscopic cholecystectomy  . CHOLECYSTECTOMY  06/02/2012   Procedure: CHOLECYSTECTOMY;  Surgeon: Jamesetta So, MD;  Location: [facility] ORS;  Service: General;  Laterality: N/A;  converted to open at  0905  . ESOPHAGOGASTRODUODENOSCOPY (EGD) WITH PROPOFOL N/A 08/24/2015   Procedure: ESOPHAGOGASTRODUODENOSCOPY (EGD) WITH PROPOFOL;  Surgeon: Rogene Houston, MD;  Location: [facility] ENDO SUITE;  Service: Endoscopy;  Laterality: N/A;  1:10 - Ann to notify pt to arrive at 11:30  . TUBAL LIGATION     X3     OB History    Gravida  3   Para  3   Term  3   Preterm      AB      Living  3     SAB      TAB      Ectopic      Multiple      Live Births               Home Medications    Prior to Admission medications   Medication Sig Start Date End Date Taking? Authorizing Provider  albuterol (PROVENTIL HFA;VENTOLIN HFA) 108 (90 BASE) MCG/ACT inhaler Inhale 2 puffs into the lungs every 6 (six) hours as needed for wheezing. Reported on 10/03/2015    [provider]  aspirin EC 81 MG tablet Take 81 mg by mouth daily.    [provider]  atorvastatin (LIPITOR) 10 MG  tablet Take 1 tablet by mouth daily. 05/04/15   [provider]  benztropine (COGENTIN) 1 MG tablet Take 1 tablet (1 mg total) by mouth at bedtime. 07/21/17 07/21/18  Cloria Spring, MD  BYDUREON BCISE 2 MG/0.85ML AUIJ Inject 2 mg into the skin. Once a week on Mondays 08/12/16   [provider]  CARVEDILOL PO Take 12.5 mg by mouth 2 (two) times daily.     [provider]  dicyclomine (BENTYL) 10 MG capsule Take 1 capsule (10 mg total) by mouth 3 (three) times daily before meals. 10/07/16   Rogene Houston, MD  DULoxetine (CYMBALTA) 60 MG capsule Take 1 capsule (60 mg total) by mouth daily at 6 PM. 07/21/17   Cloria Spring, MD  famotidine (PEPCID) 20 MG tablet Take 1 tablet (20 mg total) by mouth 2 (two) times daily. 02/16/17   Rolland Porter, MD  gabapentin (NEURONTIN) 300 MG capsule Take 300 mg  by mouth 3 (three) times daily.  03/28/15   [provider]  Insulin Glargine (TOUJEO MAX SOLOSTAR [place]) Inject 150 Units into the skin daily.     [provider]  insulin lispro (HUMALOG) 100 UNIT/ML injection Inject 15 Units into the skin 3 (three) times daily before meals.     [provider]  levothyroxine (SYNTHROID, LEVOTHROID) 25 MCG tablet Take 25 mcg by mouth daily before breakfast.  04/15/15   [provider]  LINZESS 145 MCG CAPS capsule Take 145 mcg by mouth daily before breakfast.  02/04/17   [provider]  losartan (COZAAR) 25 MG tablet Take 25 mg daily by mouth.    [provider]  omeprazole (PRILOSEC) 20 MG capsule Take 1 capsule (20 mg total) by mouth daily before supper. 04/08/16   [name], Mechele Dawley, MD  ondansetron (ZOFRAN) 4 MG tablet Take 1 tablet (4 mg total) by mouth every 8 (eight) hours as needed. 02/16/17   Rolland Porter, MD  potassium chloride 20 MEQ TBCR Take 20 mEq by mouth 2 (two) times daily. 02/16/17   Rolland Porter, MD  risperiDONE (RISPERDAL) 2 MG tablet Take 2 tablets (4 mg total) by mouth at bedtime. 07/21/17 07/21/18  Cloria Spring, MD  traMADol (ULTRAM) 50 MG tablet Take 1 tablet (50 mg total) by mouth every 6 (six) hours as needed. 01/26/16   Lugenia Assefa, PA-C  traZODone (DESYREL) 100 MG tablet Take 2 tablets (200 mg total) by mouth at bedtime. 07/21/17   Cloria Spring, MD  Vitamin D, Ergocalciferol, (DRISDOL) 50000 units CAPS capsule Take 50,000 Units by mouth every Sunday.     [provider]    Family History Family History  Problem Relation Age of Onset  . Hypertension Mother   . Alcohol abuse Mother   . Hypertension Father   . Alcohol abuse Sister   . Stroke Other   . Diabetes Other   . Cancer Other   . Seizures Other   . Alcohol abuse Maternal Aunt   . Alcohol abuse Paternal Aunt   . Alcohol abuse Maternal Grandfather   . Alcohol abuse Maternal Grandmother   . Alcohol abuse Cousin      Social History Social History   Tobacco Use  . Smoking status: Never Smoker  . Smokeless tobacco: Never Used  Substance Use Topics  . Alcohol use: No    Alcohol/week: 0.0 oz  . Drug use: No     Allergies   Patient has no known allergies.   Review of  Systems Review of Systems  Constitutional: Negative for appetite change, chills and fever.  HENT: Positive for congestion, sinus pressure, sinus pain and sore throat. Negative for trouble swallowing.   Respiratory: Positive for cough. Negative for chest tightness, shortness of breath and wheezing.   Cardiovascular: Negative for chest pain and leg swelling.  Gastrointestinal: Negative for abdominal pain, diarrhea, nausea and vomiting.  Genitourinary: Negative for dysuria.  Musculoskeletal: Negative for arthralgias, neck pain and neck stiffness.  Skin: Negative for rash.  Neurological: Negative for dizziness, weakness, numbness and headaches.  Hematological: Negative for adenopathy.  All other systems reviewed and are negative.    Physical Exam Updated Vital Signs BP (!) 149/91 (BP Location: Right Arm)   Pulse 85   Temp 97.7 F (36.5 C) (Oral)   Resp 18   Ht 5\' 2"  (1.575 m)   Wt 81.6 kg (180 lb)   LMP 01/23/2013   SpO2 97%   BMI 32.92 kg/m   Physical Exam  Constitutional: She is oriented to person, place, and time. She appears well-developed and well-nourished. No distress.  HENT:  Head: Normocephalic and atraumatic.  Right Ear: Tympanic membrane and ear canal normal.  Left Ear: Tympanic membrane and ear canal normal.  Mouth/Throat: Uvula is midline and mucous membranes are normal. No trismus in the jaw. No uvula swelling. Posterior oropharyngeal erythema present. No oropharyngeal exudate, posterior oropharyngeal edema or tonsillar abscesses.  Erythema of the oropharynx w/o exudates or edema.  No PTA  Eyes: Pupils are equal, round, and reactive to light. EOM are normal.  Neck: Normal range of motion, full  passive range of motion without pain and phonation normal. Neck supple.  Cardiovascular: Normal rate, regular rhythm and intact distal pulses.  No murmur heard. Pulmonary/Chest: Effort normal and breath sounds normal. No stridor. No respiratory distress. She has no wheezes. She has no rales. She exhibits no tenderness.  Coarse lung sounds bilaterally that clear after cough.  No rales or wheezing.  Abdominal: Soft. She exhibits no distension and no mass. There is no tenderness. There is no guarding.  Musculoskeletal: Normal range of motion. She exhibits no edema.  Lymphadenopathy:    She has no cervical adenopathy.  Neurological: She is alert and oriented to person, place, and time. No sensory deficit. She exhibits normal muscle tone. Coordination normal.  Skin: Skin is warm and dry. Capillary refill takes less than 2 seconds. No rash noted.  Psychiatric: She has a normal mood and affect.  Nursing note and vitals reviewed.    ED Treatments / Results  Labs (all labs ordered are listed, but only abnormal results are displayed) Labs Reviewed  GROUP A STREP BY PCR  CBG MONITORING, ED    EKG None  Radiology No results found.  Procedures Procedures (including critical care time)  Medications Ordered in ED Medications  ipratropium-albuterol (DUONEB) 0.5-2.5 (3) MG/3ML nebulizer solution 3 mL (has no administration in time range)     Initial Impression / Assessment and Plan / ED Course  I have reviewed the triage vital signs and the nursing notes.  Pertinent labs & imaging results that were available during my care of the patient were reviewed by me and considered in my medical decision making (see chart for details).     Pt is well appearing.  PERC neg.  Sx's likely viral.  Pt requesting albuterol inhaler. Strep neg.  Pt agrees to symptomatic tx and close PCP f/u.  return precautions discussed  Final Clinical Impressions(s) / ED Diagnoses  Final diagnoses:  Upper  respiratory tract infection, unspecified type    ED Discharge Orders    None       Bufford Lope 09/24/17 2104    Isla Pence, MD 09/28/17 1506

## 2017-09-24 NOTE — Discharge Instructions (Addendum)
Drink plenty of water.  Continue using your albuterol inhaler 1-2 puffs every 4-6 hrs for 2-3 days.  Tylenol if needed for fever.  Continue taking the benadryl as directed.  Follow-up with your primary provider for recheck

## 2017-10-02 ENCOUNTER — Ambulatory Visit: Payer: Self-pay | Admitting: Cardiology

## 2017-10-06 ENCOUNTER — Encounter: Payer: Self-pay | Admitting: Cardiology

## 2017-10-06 ENCOUNTER — Ambulatory Visit (INDEPENDENT_AMBULATORY_CARE_PROVIDER_SITE_OTHER): Payer: Medicare Other | Admitting: Cardiology

## 2017-10-06 VITALS — BP 128/80 | HR 86 | Ht 62.0 in | Wt 180.0 lb

## 2017-10-06 DIAGNOSIS — I10 Essential (primary) hypertension: Secondary | ICD-10-CM | POA: Diagnosis not present

## 2017-10-06 DIAGNOSIS — R079 Chest pain, unspecified: Secondary | ICD-10-CM | POA: Diagnosis not present

## 2017-10-06 NOTE — Progress Notes (Signed)
Clinical Summary [name] is a 47 y.o.female seen for follow up of the following medical problems.   1. Chest pain 01/2017 Echo LVEF 60-65%, no WMAs, small effusion. Diastolic function not described. By reported parameters abnormal indeterminate grade.  03/2017 nuclear stress no ischemia.   - sharp pain LUQ, 10/10. Can occur at rest or with exertion. Worst with movement, breathing.  - lasts few minutes, occurs daily - SOB improved since last visit  Past Medical History:  Diagnosis Date  . Anemia   . Anxiety   . Asthma   . Bipolar disorder ([place])   . Depression   . Diabetes mellitus   . Hypertension   . Schizoaffective disorder ([place])      No Known Allergies   Current Outpatient Medications  Medication Sig Dispense Refill  . albuterol (PROVENTIL HFA;VENTOLIN HFA) 108 (90 Base) MCG/ACT inhaler Inhale 1-2 puffs into the lungs every 6 (six) hours as needed for wheezing or shortness of breath. 1 Inhaler 0  . aspirin EC 81 MG tablet Take 81 mg by mouth daily.    Marland Kitchen atorvastatin (LIPITOR) 10 MG tablet Take 1 tablet by mouth daily.    . benzonatate (TESSALON) 200 MG capsule Take 1 capsule (200 mg total) by mouth 3 (three) times daily as needed for cough. Swallow whole, do not chew 21 capsule 0  . benztropine (COGENTIN) 1 MG tablet Take 1 tablet (1 mg total) by mouth at bedtime. 30 tablet 2  . BYDUREON BCISE 2 MG/0.85ML AUIJ Inject 2 mg into the skin. Once a week on Mondays    . CARVEDILOL PO Take 12.5 mg by mouth 2 (two) times daily.     Marland Kitchen dicyclomine (BENTYL) 10 MG capsule Take 1 capsule (10 mg total) by mouth 3 (three) times daily before meals. 90 capsule 2  . DULoxetine (CYMBALTA) 60 MG capsule Take 1 capsule (60 mg total) by mouth daily at 6 PM. 30 capsule 2  . famotidine (PEPCID) 20 MG tablet Take 1 tablet (20 mg total) by mouth 2 (two) times daily. 20 tablet 0  . gabapentin (NEURONTIN) 300 MG capsule Take 300 mg by mouth 3 (three) times daily.     . Insulin Glargine  (TOUJEO MAX SOLOSTAR [place]) Inject 150 Units into the skin daily.     . insulin lispro (HUMALOG) 100 UNIT/ML injection Inject 15 Units into the skin 3 (three) times daily before meals.     Marland Kitchen levothyroxine (SYNTHROID, LEVOTHROID) 25 MCG tablet Take 25 mcg by mouth daily before breakfast.     . LINZESS 145 MCG CAPS capsule Take 145 mcg by mouth daily before breakfast.     . losartan (COZAAR) 25 MG tablet Take 25 mg daily by mouth.    . magic mouthwash w/lidocaine SOLN Take 5 mLs by mouth 3 (three) times daily as needed for mouth pain. 100 mL 0  . omeprazole (PRILOSEC) 20 MG capsule Take 1 capsule (20 mg total) by mouth daily before supper.    . ondansetron (ZOFRAN) 4 MG tablet Take 1 tablet (4 mg total) by mouth every 8 (eight) hours as needed. 6 tablet 0  . potassium chloride 20 MEQ TBCR Take 20 mEq by mouth 2 (two) times daily. 8 tablet 0  . risperiDONE (RISPERDAL) 2 MG tablet Take 2 tablets (4 mg total) by mouth at bedtime. 60 tablet 2  . traMADol (ULTRAM) 50 MG tablet Take 1 tablet (50 mg total) by mouth every 6 (six) hours as needed. 10 tablet  0  . traZODone (DESYREL) 100 MG tablet Take 2 tablets (200 mg total) by mouth at bedtime. 60 tablet 2  . Vitamin D, Ergocalciferol, (DRISDOL) 50000 units CAPS capsule Take 50,000 Units by mouth every Sunday.      No current facility-administered medications for this visit.      Past Surgical History:  Procedure Laterality Date  . BREAST SURGERY Right    biopsy  . CESAREAN SECTION    . CHOLECYSTECTOMY  06/02/2012   Procedure: LAPAROSCOPIC CHOLECYSTECTOMY;  Surgeon: Jamesetta So, MD;  Location: [facility] ORS;  Service: General;  Laterality: N/A;  Attempted laparoscopic cholecystectomy  . CHOLECYSTECTOMY  06/02/2012   Procedure: CHOLECYSTECTOMY;  Surgeon: Jamesetta So, MD;  Location: [facility] ORS;  Service: General;  Laterality: N/A;  converted to open at  0905  . ESOPHAGOGASTRODUODENOSCOPY (EGD) WITH PROPOFOL N/A 08/24/2015   Procedure: ESOPHAGOGASTRODUODENOSCOPY  (EGD) WITH PROPOFOL;  Surgeon: Rogene Houston, MD;  Location: [facility] END[facility] SU[facility];  Service: Endoscopy;  Laterality: N/A;  1:10 - Ann to notify pt to arrive at 11:30  . TUBAL LIGATION     X3     No Known Allergies    Family History  Problem Relation Age of Onset  . Hypertension Mother   . Alcohol abuse Mother   . Hypertension Father   . Alcohol abuse Sister   . Stroke Other   . Diabetes Other   . Cancer Other   . Seizures Other   . Alcohol abuse Maternal Aunt   . Alcohol abuse Paternal Aunt   . Alcohol abuse Maternal Grandfather   . Alcohol abuse Maternal Grandmother   . Alcohol abuse Cousin      Social History [name] reports that she has never smoked. She has never used smokeless tobacco. [name] reports that she does not drink alcohol.   Review of Systems CONSTITUTIONAL: No weight loss, fever, chills, weakness or fatigue.  HEENT: Eyes: No visual loss, blurred vision, double vision or yellow sclerae.No hearing loss, sneezing, congestion, runny nose or sore throat.  SKIN: No rash or itching.  CARDIOVASCULAR: per hpi RESPIRATORY: No shortness of breath, cough or sputum.  GASTROINTESTINAL: No anorexia, nausea, vomiting or diarrhea. No abdominal pain or blood.  GENITOURINARY: No burning on urination, no polyuria NEUROLOGICAL: No headache, dizziness, syncope, paralysis, ataxia, numbness or tingling in the extremities. No change in bowel or bladder control.  MUSCULOSKELETAL: No muscle, back pain, joint pain or stiffness.  LYMPHATICS: No enlarged nodes. No history of splenectomy.  PSYCHIATRIC: No history of depression or anxiety.  ENDOCRINOLOGIC: No reports of sweating, cold or heat intolerance. No polyuria or polydipsia.  Marland Kitchen   Physical Examination Vitals:   10/06/17 0957  BP: 128/80  Pulse: 86  SpO2: 97%   Vitals:   10/06/17 0957  Weight: 180 lb (81.6 kg)  Height: 5\' 2"  (1.575 m)    Gen: resting comfortably, no acute distress HEENT: no scleral icterus,  pupils equal round and reactive, no palptable cervical adenopathy,  CV: RRR, no m/r/g, no jvd Resp: Clear to auscultation bilaterally GI: abdomen is soft, non-tender, non-distended, normal bowel sounds, no hepatosplenomegaly MSK: extremities are warm, no edema.  Skin: warm, no rash Neuro:  no focal deficits Psych: appropriate affect   Diagnostic Studies 01/2017 echo Study Conclusions  - Left ventricle: The cavity size was normal. Wall thickness was increased in a pattern of mild LVH. Systolic function was normal. The estimated ejection fraction was in the range of 60% to 65%. Wall motion  was normal; there were no regional wall motion abnormalities. - Right atrium: Central venous pressure (est): 3 mm Hg. - Atrial septum: No defect or patent foramen ovale was identified. - Tricuspid valve: There was trivial regurgitation. - Pulmonary arteries: Systolic pressure could not be accurately estimated. - Pericardium, extracardiac: A small pericardial effusion was identified circumferential to the heart.  Impressions:  - Mild LVH with LVEF 60-65%. Indeterminate diastolic function. Trivial tricuspid regurgitation. Small circumferential pericardial effusion.    03/2017 Nuclear stress  Sinus rhythm with nonspecific T wave abnormalities seen throughout study.  The study is normal with normal myocardial perfusion.  This is a low risk study.  Nuclear stress EF: 62%.       Assessment and Plan   1. Chest pain - negative nuclear stress test, no further cardiac workup at this time. Current symptoms are atypical - continue CAD risk factor modification.   2. HTN - at goal, continue current meds      Arnoldo Lenis, M.D

## 2017-10-06 NOTE — Patient Instructions (Signed)

## 2017-10-11 ENCOUNTER — Encounter: Payer: Self-pay | Admitting: Cardiology

## 2017-10-20 ENCOUNTER — Ambulatory Visit (HOSPITAL_COMMUNITY): Payer: Self-pay | Admitting: Psychiatry

## 2017-10-23 ENCOUNTER — Ambulatory Visit (HOSPITAL_COMMUNITY): Payer: Medicare Other | Admitting: Psychiatry

## 2017-10-28 ENCOUNTER — Encounter (HOSPITAL_COMMUNITY): Payer: Self-pay | Admitting: Psychiatry

## 2017-10-28 ENCOUNTER — Ambulatory Visit (INDEPENDENT_AMBULATORY_CARE_PROVIDER_SITE_OTHER): Payer: Medicare Other | Admitting: Psychiatry

## 2017-10-28 VITALS — BP 125/90 | HR 89 | Ht 62.0 in | Wt 183.0 lb

## 2017-10-28 DIAGNOSIS — F251 Schizoaffective disorder, depressive type: Secondary | ICD-10-CM | POA: Diagnosis not present

## 2017-10-28 MED ORDER — TRAZODONE HCL 100 MG PO TABS: 200.0000 mg | ORAL_TABLET | Freq: Every day | ORAL | 2 refills | Status: DC

## 2017-10-28 MED ORDER — DULOXETINE HCL 60 MG PO CPEP: 60.0000 mg | ORAL_CAPSULE | Freq: Every day | ORAL | 2 refills | Status: DC

## 2017-10-28 MED ORDER — BENZTROPINE MESYLATE 1 MG PO TABS: 1.0000 mg | ORAL_TABLET | Freq: Every day | ORAL | 2 refills | Status: DC

## 2017-10-28 MED ORDER — RISPERIDONE 2 MG PO TABS: 4.0000 mg | ORAL_TABLET | Freq: Every day | ORAL | 2 refills | Status: DC
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
## 2017-11-17 ENCOUNTER — Encounter (HOSPITAL_COMMUNITY): Payer: Self-pay | Admitting: Emergency Medicine

## 2017-11-17 ENCOUNTER — Emergency Department (HOSPITAL_COMMUNITY)
Admission: EM | Admit: 2017-11-17 | Discharge: 2017-11-17 | Disposition: A | Discharge status: Home / Self Care | Payer: PRIVATE HEALTH INSURANCE | Attending: Emergency Medicine | Admitting: Emergency Medicine

## 2017-11-17 ENCOUNTER — Other Ambulatory Visit: Payer: Self-pay

## 2017-11-17 DIAGNOSIS — Z794 Long term (current) use of insulin: Secondary | ICD-10-CM | POA: Insufficient documentation

## 2017-11-17 DIAGNOSIS — M791 Myalgia, unspecified site: Secondary | ICD-10-CM | POA: Insufficient documentation

## 2017-11-17 DIAGNOSIS — E119 Type 2 diabetes mellitus without complications: Secondary | ICD-10-CM | POA: Insufficient documentation

## 2017-11-17 DIAGNOSIS — Z7982 Long term (current) use of aspirin: Secondary | ICD-10-CM | POA: Insufficient documentation

## 2017-11-17 DIAGNOSIS — E876 Hypokalemia: Secondary | ICD-10-CM | POA: Diagnosis not present

## 2017-11-17 DIAGNOSIS — Z79899 Other long term (current) drug therapy: Secondary | ICD-10-CM | POA: Diagnosis not present

## 2017-11-17 DIAGNOSIS — J45909 Unspecified asthma, uncomplicated: Secondary | ICD-10-CM | POA: Diagnosis not present

## 2017-11-17 DIAGNOSIS — I10 Essential (primary) hypertension: Secondary | ICD-10-CM | POA: Insufficient documentation

## 2017-11-17 LAB — BASIC METABOLIC PANEL
Anion gap: 14 (ref 5–15)
BUN: 16 mg/dL (ref 6–20)
CO2: 23 mmol/L (ref 22–32)
Calcium: 9.7 mg/dL (ref 8.9–10.3)
Chloride: 98 mmol/L (ref 98–111)
Creatinine, Ser: 1.1 mg/dL — ABNORMAL HIGH (ref 0.44–1.00)
GFR calc Af Amer: >60 mL/min (ref >60)
GFR calc non Af Amer: 59 mL/min — ABNORMAL LOW (ref >60)
Glucose, Bld: 292 mg/dL — ABNORMAL HIGH (ref 70–99)
Potassium: 3.3 mmol/L — ABNORMAL LOW (ref 3.5–5.1)
Sodium: 135 mmol/L (ref 135–145)

## 2017-11-17 LAB — URINALYSIS, ROUTINE W REFLEX MICROSCOPIC
Bacteria, UA: NONE SEEN
Bilirubin Urine: NEGATIVE
Glucose, UA: >=500 mg/dL — AB
Ketones, ur: NEGATIVE mg/dL
Leukocytes, UA: NEGATIVE
Nitrite: NEGATIVE
Protein, ur: 100 mg/dL — AB
Specific Gravity, Urine: 1.036 — ABNORMAL HIGH (ref 1.005–1.030)
pH: 5 (ref 5.0–8.0)

## 2017-11-17 LAB — CBC WITH DIFFERENTIAL/PLATELET
Basophils Absolute: 0 10*3/uL (ref 0.0–0.1)
Basophils Relative: 0 %
Eosinophils Absolute: 0.2 10*3/uL (ref 0.0–0.7)
Eosinophils Relative: 2 %
HCT: 40.2 % (ref 36.0–46.0)
Hemoglobin: 14.1 g/dL (ref 12.0–15.0)
Lymphocytes Relative: 32 %
Lymphs Abs: 2.7 10*3/uL (ref 0.7–4.0)
MCH: 28.5 pg (ref 26.0–34.0)
MCHC: 35.1 g/dL (ref 30.0–36.0)
MCV: 81.2 fL (ref 78.0–100.0)
Monocytes Absolute: 0.9 10*3/uL (ref 0.1–1.0)
Monocytes Relative: 10 %
Neutro Abs: 4.8 10*3/uL (ref 1.7–7.7)
Neutrophils Relative %: 56 %
Platelets: 325 10*3/uL (ref 150–400)
RBC: 4.95 MIL/uL (ref 3.87–5.11)
RDW: 12.7 % (ref 11.5–15.5)
WBC: 8.6 10*3/uL (ref 4.0–10.5)

## 2017-11-17 LAB — CK: Total CK: 249 U/L — ABNORMAL HIGH (ref 38–234)

## 2017-11-17 MED ORDER — SODIUM CHLORIDE 0.9 % IV BOLUS
1000.0000 mL | Freq: Once | INTRAVENOUS | Status: AC
  Administered 2017-11-17: 1000 mL via INTRAVENOUS

## 2017-11-17 MED ORDER — POTASSIUM CHLORIDE CRYS ER 20 MEQ PO TBCR
40.0000 meq | EXTENDED_RELEASE_TABLET | Freq: Once | ORAL | Status: AC
  Administered 2017-11-17: 40 meq via ORAL
  Filled 2017-11-17: qty 2

## 2017-11-17 MED ORDER — POTASSIUM CHLORIDE ER 10 MEQ PO TBCR: 10.0000 meq | EXTENDED_RELEASE_TABLET | Freq: Two times a day (BID) | ORAL | 0 refills | Status: DC

## 2017-11-17 NOTE — ED Triage Notes (Signed)
Pt c/o arms and legs cramping that started this evening.

## 2017-11-17 NOTE — ED Notes (Signed)
Pt complaining of leg cramps at this time

## 2017-11-17 NOTE — ED Provider Notes (Signed)
[facility] EMERGENCY DEPARTMENT Provider Note   CSN: [id number] Arrival date & time: 11/17/17  0001     History   Chief Complaint Chief Complaint  Patient presents with  . Generalized Body Aches    HPI Kathryn [name] is a 47 y.o. female.  Patient presents with bilateral cramping to her hands, forearms, feet and lower legs.  This started about 1115 when she was getting ready for bed.  States she has been doing well throughout the day before this.  She did walk several blocks of [employer] [place] with some friends this evening.  States she has been well-hydrated and eating and drinking well.  She has a history of neuropathy from her diabetes but has never had this kind of cramping before.  He took 2 spoonfuls of mustard at home without relief.  Denies any chest pain or shortness of breath.  Denies any focal weakness, numbness or tingling.  No fever.  Denies any statin use.  The history is provided by the patient.    Past Medical History:  Diagnosis Date  . Anemia   . Anxiety   . Asthma   . Bipolar disorder ([place])   . Depression   . Diabetes mellitus   . Hypertension   . Schizoaffective disorder [facility])     Patient Active Problem List   Diagnosis Date Noted  . GERD (gastroesophageal reflux disease) 10/08/2015  . IBS (irritable colon syndrome) 10/08/2015  . Agitation 09/07/2015  . Lithium toxicity 09/07/2015  . Altered mental status   . Elevated liver enzymes 09/06/2015  . AKI (acute kidney injury) ([place]) 09/06/2015  . Diabetes ([place]) 08/22/2015  . Essential hypertension 08/22/2015  . Schizoaffective disorder ([place]) 02/08/2015  . PTSD (post-traumatic stress disorder) 02/08/2015  . Acute low back pain due to trauma 09/21/2012    Past Surgical History:  Procedure Laterality Date  . BREAST SURGERY Right    biopsy  . CESAREAN SECTION    . CHOLECYSTECTOMY  06/02/2012   Procedure: LAPAROSCOPIC CHOLECYSTECTOMY;  Surgeon: Jamesetta So, MD;  Location: [facility] ORS;  Service: General;   Laterality: N/A;  Attempted laparoscopic cholecystectomy  . CHOLECYSTECTOMY  06/02/2012   Procedure: CHOLECYSTECTOMY;  Surgeon: Jamesetta So, MD;  Location: [facility] ORS;  Service: General;  Laterality: N/A;  converted to open at  0905  . ESOPHAGOGASTRODUODENOSCOPY (EGD) WITH PROPOFOL N/A 08/24/2015   Procedure: ESOPHAGOGASTRODUODENOSCOPY (EGD) WITH PROPOFOL;  Surgeon: Rogene Houston, MD;  Location: [facility] ENDO SUITE;  Service: Endoscopy;  Laterality: N/A;  1:10 - Ann to notify pt to arrive at 11:30  . tooth removal  2019   all teeth removed  . TUBAL LIGATION     X3     OB History    Gravida  3   Para  3   Term  3   Preterm      AB      Living  3     SAB      TAB      Ectopic      Multiple      Live Births               Home Medications    Prior to Admission medications   Medication Sig Start Date End Date Taking? Authorizing Provider  albuterol (PROVENTIL HFA;VENTOLIN HFA) 108 (90 Base) MCG/ACT inhaler Inhale 1-2 puffs into the lungs every 6 (six) hours as needed for wheezing or shortness of breath. 09/24/17   Triplett, Tammy, PA-C  aspirin EC  81 MG tablet Take 81 mg by mouth daily.    [provider]  atorvastatin (LIPITOR) 10 MG tablet Take 1 tablet by mouth daily. 05/04/15   [provider]  benztropine (COGENTIN) 1 MG tablet Take 1 tablet (1 mg total) by mouth at bedtime. 10/28/17 10/28/18  Cloria Spring, MD  BYDUREON BCISE 2 MG/0.85ML AUIJ Inject 2 mg into the skin. Once a week on Mondays 08/12/16   [provider]  CARVEDILOL PO Take 12.5 mg by mouth 2 (two) times daily.     [provider]  dicyclomine (BENTYL) 10 MG capsule Take 1 capsule (10 mg total) by mouth 3 (three) times daily before meals. 10/07/16   Rogene Houston, MD  DULoxetine (CYMBALTA) 60 MG capsule Take 1 capsule (60 mg total) by mouth daily at 6 PM. 10/28/17   Cloria Spring, MD  famotidine (PEPCID) 20 MG tablet Take 1 tablet (20 mg total) by mouth 2 (two) times daily.  02/16/17   Rolland Porter, MD  gabapentin (NEURONTIN) 300 MG capsule Take 300 mg by mouth 3 (three) times daily.  03/28/15   [provider]  Insulin Glargine (TOUJEO MAX SOLOSTAR [place]) Inject 150 Units into the skin daily.     [provider]  insulin lispro (HUMALOG) 100 UNIT/ML injection Inject 15 Units into the skin 3 (three) times daily before meals.     [provider]  levothyroxine (SYNTHROID, LEVOTHROID) 25 MCG tablet Take 25 mcg by mouth daily before breakfast.  04/15/15   [provider]  LINZESS 145 MCG CAPS capsule Take 145 mcg by mouth daily before breakfast.  02/04/17   [provider]  losartan (COZAAR) 25 MG tablet Take 25 mg daily by mouth.    [provider]  magic mouthwash w/lidocaine SOLN Take 5 mLs by mouth 3 (three) times daily as needed for mouth pain. 09/24/17   Triplett, Tammy, PA-C  omeprazole (PRILOSEC) 20 MG capsule Take 1 capsule (20 mg total) by mouth daily before supper. 04/08/16   [name], Mechele Dawley, MD  ondansetron (ZOFRAN) 4 MG tablet Take 1 tablet (4 mg total) by mouth every 8 (eight) hours as needed. 02/16/17   Rolland Porter, MD  potassium chloride 20 MEQ TBCR Take 20 mEq by mouth 2 (two) times daily. 02/16/17   Rolland Porter, MD  risperiDONE (RISPERDAL) 2 MG tablet Take 2 tablets (4 mg total) by mouth at bedtime. 10/28/17 10/28/18  Cloria Spring, MD  traMADol (ULTRAM) 50 MG tablet Take 1 tablet (50 mg total) by mouth every 6 (six) hours as needed. 01/26/16   Triplett, Tammy, PA-C  traZODone (DESYREL) 100 MG tablet Take 2 tablets (200 mg total) by mouth at bedtime. 10/28/17   Cloria Spring, MD  Vitamin D, Ergocalciferol, (DRISDOL) 50000 units CAPS capsule Take 50,000 Units by mouth every Sunday.     [provider]    Family History Family History  Problem Relation Age of Onset  . Hypertension Mother   . Alcohol abuse Mother   . Hypertension Father   . Alcohol abuse Sister   . Stroke Other   . Diabetes Other   .  Cancer Other   . Seizures Other   . Alcohol abuse Maternal Aunt   . Alcohol abuse Paternal Aunt   . Alcohol abuse Maternal Grandfather   . Alcohol abuse Maternal Grandmother   . Alcohol abuse Cousin     Social History Social History   Tobacco Use  . Smoking  status: Never Smoker  . Smokeless tobacco: Never Used  Substance Use Topics  . Alcohol use: No    Alcohol/week: 0.0 oz  . Drug use: No     Allergies   Patient has no known allergies.   Review of Systems Review of Systems  Constitutional: Negative for activity change, appetite change and fever.  HENT: Negative for congestion, rhinorrhea and sinus pain.   Eyes: Negative for visual disturbance.  Respiratory: Negative for cough, chest tightness and shortness of breath.   Cardiovascular: Negative for chest pain and leg swelling.  Gastrointestinal: Negative for abdominal pain, nausea and vomiting.  Genitourinary: Negative for dysuria, hematuria, vaginal bleeding and vaginal discharge.  Musculoskeletal: Positive for arthralgias and myalgias. Negative for back pain and neck pain.  Neurological: Negative for dizziness, weakness and headaches.    all other systems are negative except as noted in the HPI and PMH.    Physical Exam Updated Vital Signs BP (!) 169/103   Pulse (!) 124   Temp 98.3 F (36.8 C)   Resp 18   Ht 5\' 1"  (1.549 m)   Wt 85.7 kg (189 lb)   LMP 01/23/2013   SpO2 100%   BMI 35.71 kg/m   Physical Exam  Constitutional: She is oriented to person, place, and time. She appears well-developed and well-nourished. No distress.  HENT:  Head: Normocephalic and atraumatic.  Mouth/Throat: Oropharynx is clear and moist. No oropharyngeal exudate.  Eyes: Pupils are equal, round, and reactive to light. Conjunctivae and EOM are normal.  Neck: Normal range of motion. Neck supple.  No meningismus.  Cardiovascular: Normal rate, normal heart sounds and intact distal pulses.  No murmur heard. Tachycardic 120s    Pulmonary/Chest: Effort normal and breath sounds normal. No respiratory distress.  Abdominal: Soft. There is no tenderness. There is no rebound and no guarding.  Musculoskeletal: Normal range of motion. She exhibits no edema or tenderness.  Full range of motion of bilateral hips, knees, ankles, wrists and elbows.  There is no erythema or significant joint swelling.  Intact radial, DP and PT pulses  Neurological: She is alert and oriented to person, place, and time. No cranial nerve deficit. She exhibits normal muscle tone. Coordination normal.   5/5 strength throughout. CN 2-12 intact.Equal grip strength.   Skin: Skin is warm.  Psychiatric: She has a normal mood and affect. Her behavior is normal.  Nursing note and vitals reviewed.    ED Treatments / Results  Labs (all labs ordered are listed, but only abnormal results are displayed) Labs Reviewed  URINALYSIS, ROUTINE W REFLEX MICROSCOPIC - Abnormal; Notable for the following components:      Result Value   APPearance HAZY (*)    Specific Gravity, Urine 1.036 (*)    Glucose, UA >=500 (*)    Hgb urine dipstick SMALL (*)    Protein, ur 100 (*)    All other components within normal limits  BASIC METABOLIC PANEL - Abnormal; Notable for the following components:   Potassium 3.3 (*)    Glucose, Bld 292 (*)    Creatinine, Ser 1.10 (*)    GFR calc non Af Amer 59 (*)    All other components within normal limits  CK - Abnormal; Notable for the following components:   Total CK 249 (*)    All other components within normal limits  CBC WITH DIFFERENTIAL/PLATELET    EKG None  Radiology No results found.  Procedures Procedures (including critical care time)  Medications Ordered in ED  Medications  sodium chloride 0.9 % bolus 1,000 mL (has no administration in time range)     Initial Impression / Assessment and Plan / ED Course  I have reviewed the triage vital signs and the nursing notes.  Pertinent labs & imaging results that  were available during my care of the patient were reviewed by me and considered in my medical decision making (see chart for details).    Patient with pain to her bilateral hands and feet with cramps.  Neurovascularly intact.  IVF given with symptom control. Cr normal. CK minimally elevated 249. POtassium low at 3.3  Patient hydrated and given potassium. Cramping has improved. Initial tachycardia has improved.   She is tolerating PO and ambulatory. Will treat with PO potassium. Advised to followup with PCP for recheck in 2 days. Return precautions discussed.   Final Clinical Impressions(s) / ED Diagnoses   Final diagnoses:  Myalgia  Hypokalemia    ED Discharge Orders    None       Alana Dayton, Annie Main, MD 11/17/17 [phone]

## 2017-11-17 NOTE — Discharge Instructions (Addendum)
°  Keep yourself hydrated.  Take the potassium supplements as prescribed.  Follow-up with your doctor.  Return to the ED if you develop chest pain, shortness of breath or any other concerns.

## 2017-12-07 IMAGING — CT CT HEAD W/O CM
1 series · 16 of 30 positions shown, 20 images · non-contrast
Comparison: MRI brain 01/11/2013.  CT head 04/01/2003

CLINICAL DATA: Numbness and tingling in the right arm and hand.

EXAM:
CT HEAD WITHOUT CONTRAST
TECHNIQUE: Contiguous axial images were obtained from the base of the skull
through the vertex without intravenous contrast.

[Series 2: headtrauma 4.8 h37s · axial · 0.43mm/px · z∈[+124,+255]mm · 16 of 30 slices shown, 20 images]
[im 2/30  brain]
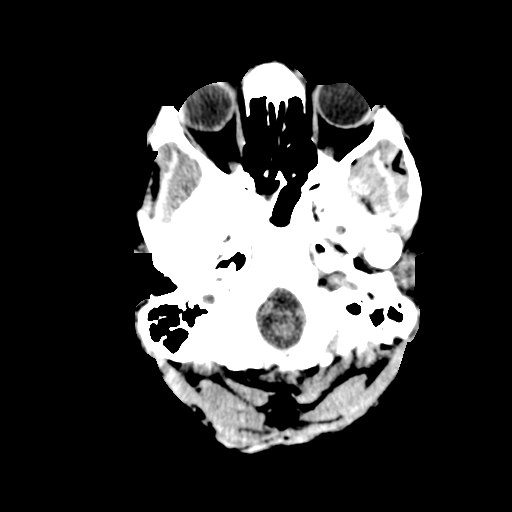
[im 2/30  bone]
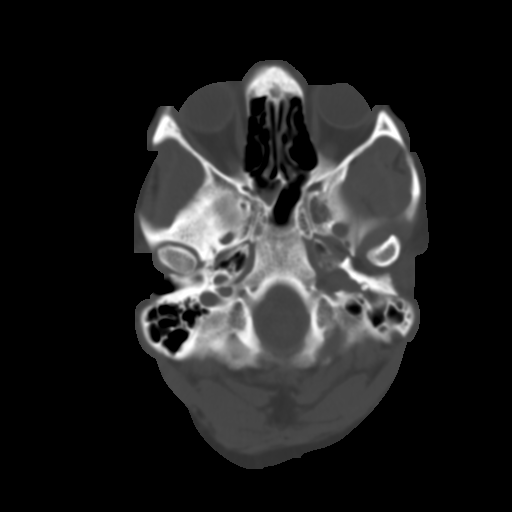
[im 4/30  brain]
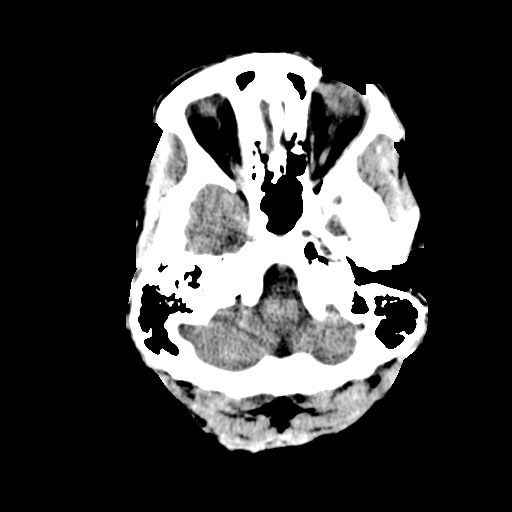
[im 6/30  brain]
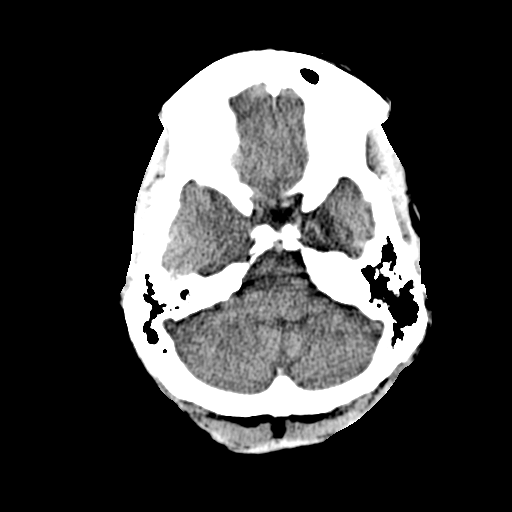
[im 8/30  brain]
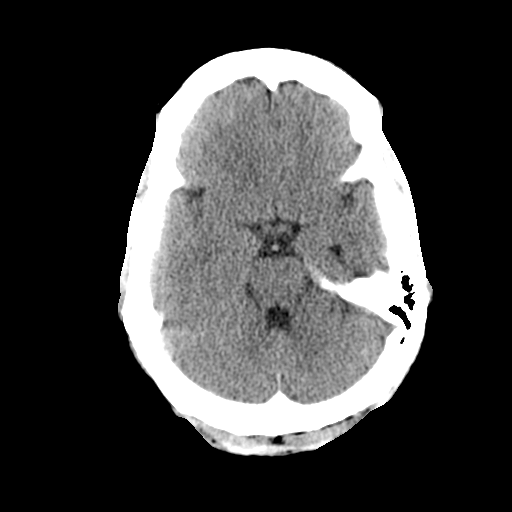
[im 9/30  brain]
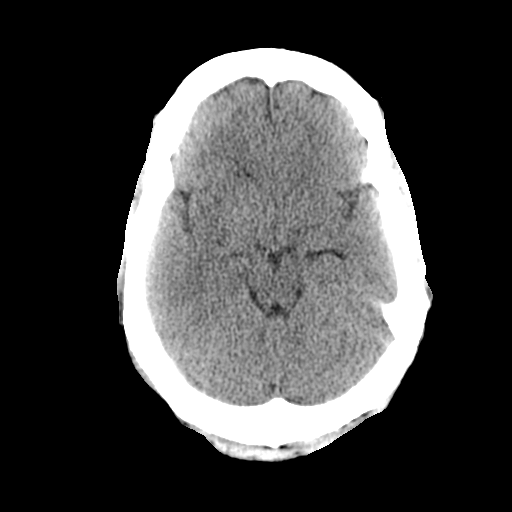
[im 9/30  bone]
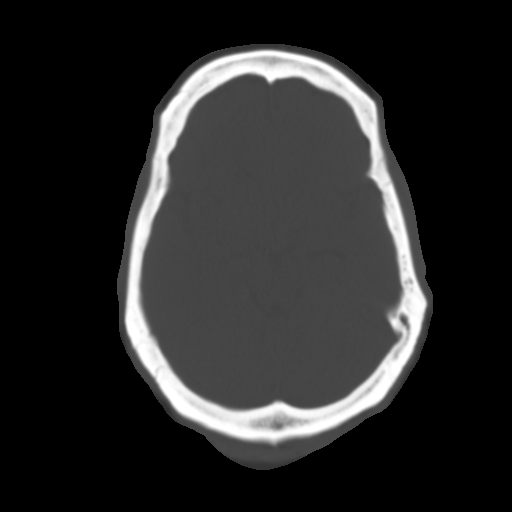
[im 11/30  brain]
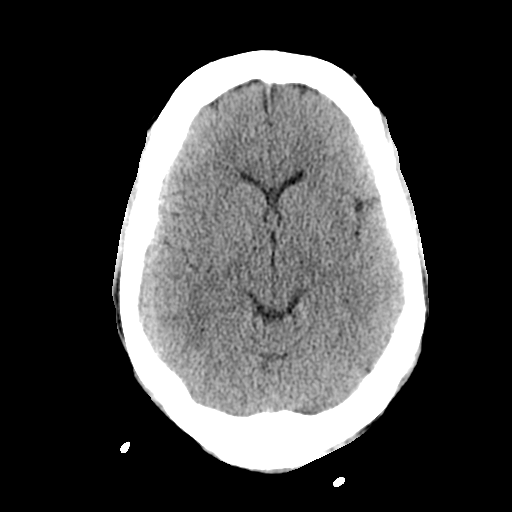
[im 13/30  brain]
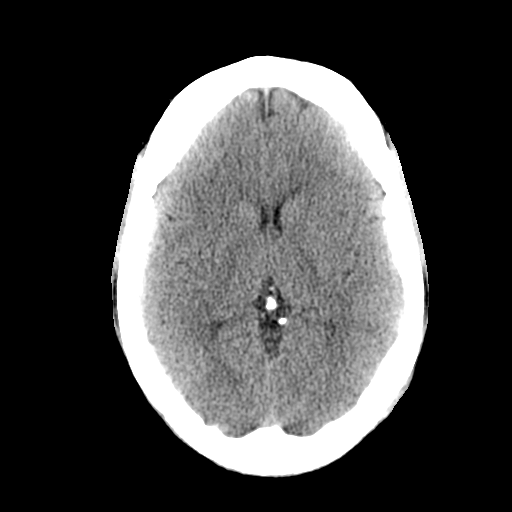
[im 15/30  brain]
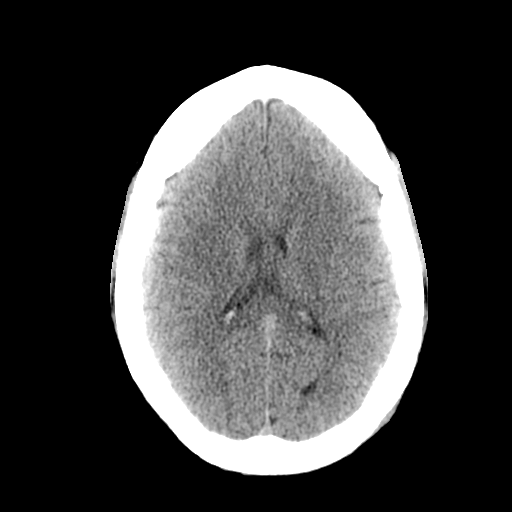
[im 16/30  brain]
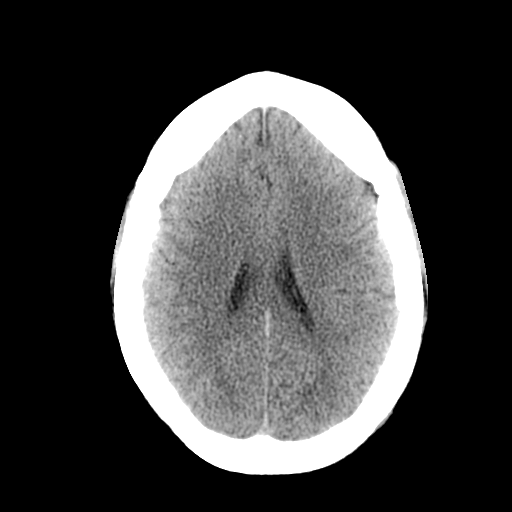
[im 16/30  bone]
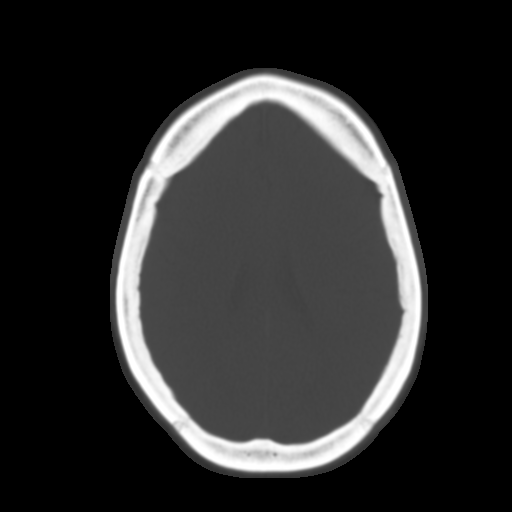
[im 18/30  brain]
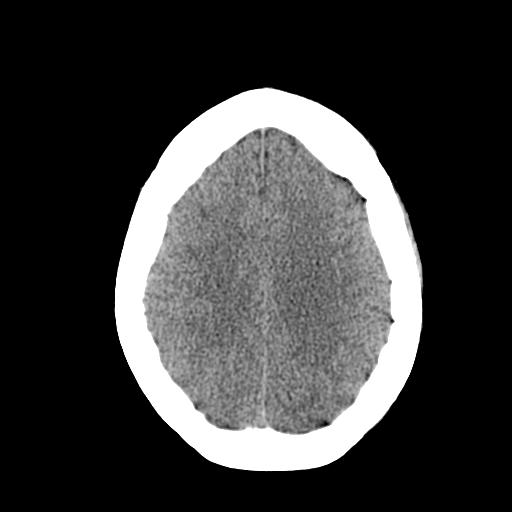
[im 20/30  brain]
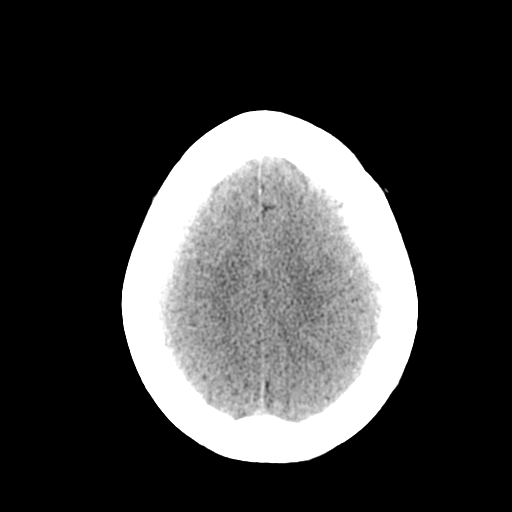
[im 22/30  brain]
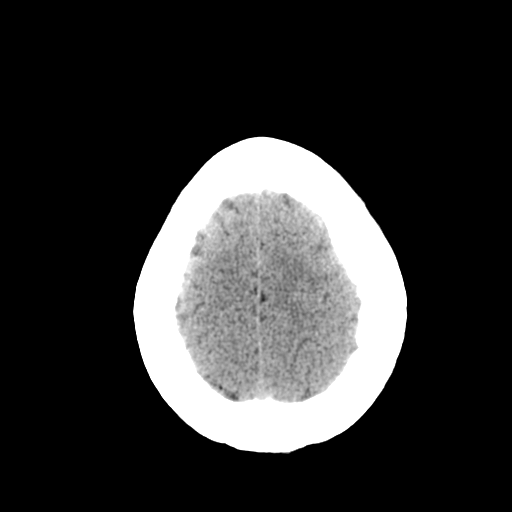
[im 23/30  brain]
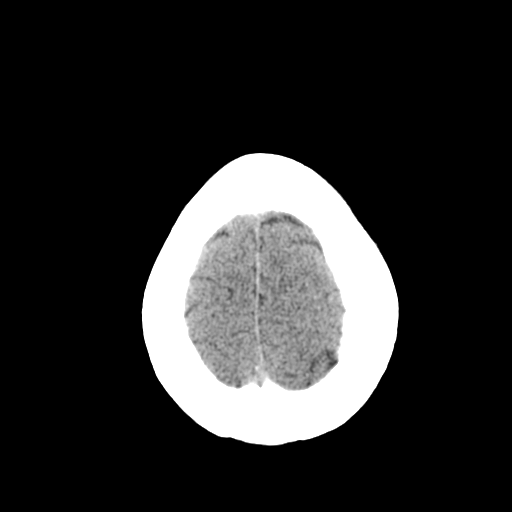
[im 23/30  bone]
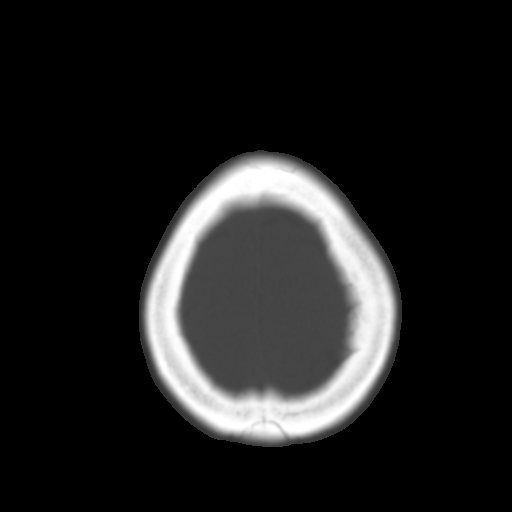
[im 25/30  brain]
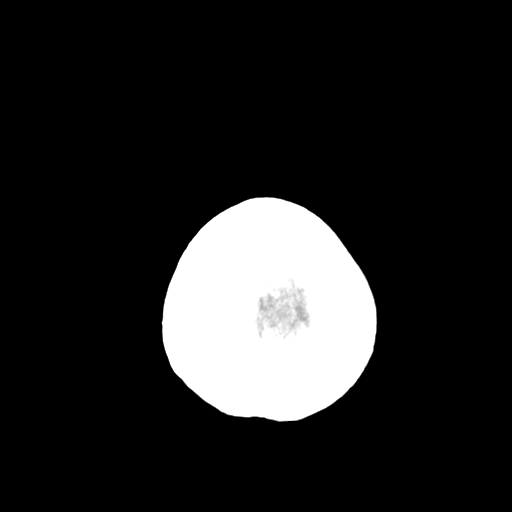
[im 27/30  brain]
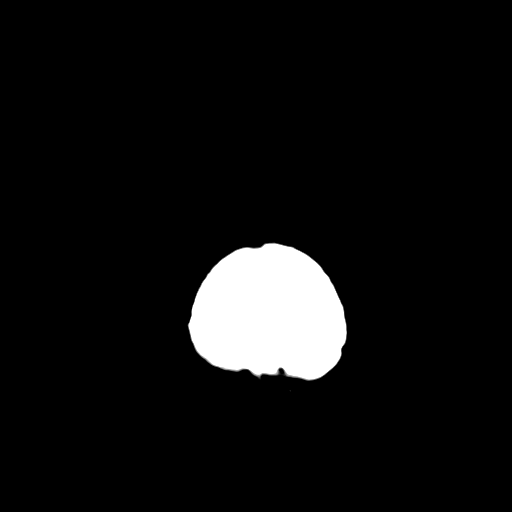
[im 29/30  brain]
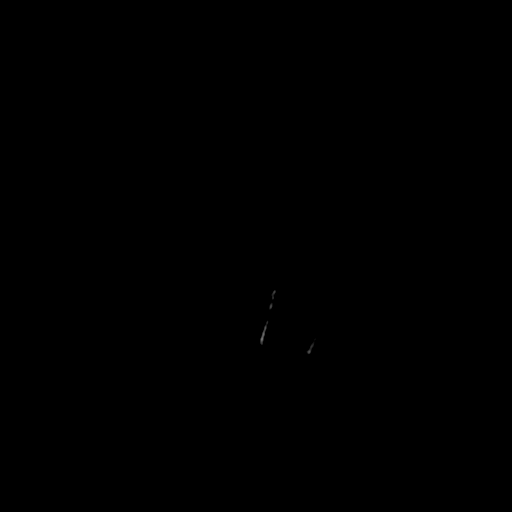

[16 of 30 positions shown; findings below may reference images not displayed]

FINDINGS: Ventricles and sulci appear symmetrical. No ventricular dilatation.
No mass effect or midline shift. No abnormal extra-axial fluid
collections. Gray-white matter junctions are distinct. Basal
cisterns are not effaced. No evidence of acute intracranial
hemorrhage. No depressed skull fractures. Fluid in the sphenoid
sinuses likely inflammatory. Mastoid air cells are not opacified.
IMPRESSION: No acute intracranial abnormalities. Probable inflammatory changes
in the sphenoid sinus.

## 2017-12-17 ENCOUNTER — Ambulatory Visit (INDEPENDENT_AMBULATORY_CARE_PROVIDER_SITE_OTHER): Payer: Self-pay | Admitting: Otolaryngology

## 2017-12-21 ENCOUNTER — Ambulatory Visit (INDEPENDENT_AMBULATORY_CARE_PROVIDER_SITE_OTHER): Payer: Self-pay | Admitting: Otolaryngology

## 2017-12-30 ENCOUNTER — Ambulatory Visit (HOSPITAL_COMMUNITY): Payer: Medicaid Other | Admitting: Psychiatry

## 2018-01-01 ENCOUNTER — Ambulatory Visit (HOSPITAL_COMMUNITY): Payer: Medicare HMO | Admitting: Psychiatry

## 2018-01-16 ENCOUNTER — Encounter (HOSPITAL_COMMUNITY): Payer: Self-pay | Admitting: Emergency Medicine

## 2018-01-16 ENCOUNTER — Emergency Department (HOSPITAL_COMMUNITY)
Admission: EM | Admit: 2018-01-16 | Discharge: 2018-01-16 | Disposition: A | Discharge status: Home / Self Care | Payer: PRIVATE HEALTH INSURANCE | Attending: Emergency Medicine | Admitting: Emergency Medicine

## 2018-01-16 ENCOUNTER — Emergency Department (HOSPITAL_COMMUNITY): Payer: PRIVATE HEALTH INSURANCE

## 2018-01-16 ENCOUNTER — Other Ambulatory Visit: Payer: Self-pay

## 2018-01-16 DIAGNOSIS — M19041 Primary osteoarthritis, right hand: Secondary | ICD-10-CM | POA: Diagnosis not present

## 2018-01-16 DIAGNOSIS — S6991XA Unspecified injury of right wrist, hand and finger(s), initial encounter: Secondary | ICD-10-CM | POA: Diagnosis present

## 2018-01-16 DIAGNOSIS — S60221A Contusion of right hand, initial encounter: Secondary | ICD-10-CM | POA: Insufficient documentation

## 2018-01-16 DIAGNOSIS — W230XXA Caught, crushed, jammed, or pinched between moving objects, initial encounter: Secondary | ICD-10-CM | POA: Insufficient documentation

## 2018-01-16 DIAGNOSIS — Y990 Civilian activity done for income or pay: Secondary | ICD-10-CM | POA: Insufficient documentation

## 2018-01-16 DIAGNOSIS — Y9289 Other specified places as the place of occurrence of the external cause: Secondary | ICD-10-CM | POA: Diagnosis not present

## 2018-01-16 DIAGNOSIS — J45909 Unspecified asthma, uncomplicated: Secondary | ICD-10-CM | POA: Insufficient documentation

## 2018-01-16 DIAGNOSIS — Z79899 Other long term (current) drug therapy: Secondary | ICD-10-CM | POA: Diagnosis not present

## 2018-01-16 DIAGNOSIS — Y9389 Activity, other specified: Secondary | ICD-10-CM | POA: Insufficient documentation

## 2018-01-16 DIAGNOSIS — E119 Type 2 diabetes mellitus without complications: Secondary | ICD-10-CM | POA: Insufficient documentation

## 2018-01-16 DIAGNOSIS — I10 Essential (primary) hypertension: Secondary | ICD-10-CM | POA: Insufficient documentation

## 2018-01-16 DIAGNOSIS — R030 Elevated blood-pressure reading, without diagnosis of hypertension: Secondary | ICD-10-CM

## 2018-01-16 DIAGNOSIS — Z794 Long term (current) use of insulin: Secondary | ICD-10-CM | POA: Insufficient documentation

## 2018-01-16 DIAGNOSIS — M79641 Pain in right hand: Secondary | ICD-10-CM | POA: Diagnosis not present

## 2018-01-16 DIAGNOSIS — Z7982 Long term (current) use of aspirin: Secondary | ICD-10-CM | POA: Insufficient documentation

## 2018-01-16 NOTE — Discharge Instructions (Signed)
Your blood pressure is elevated tonight.  Please have this rechecked by your primary physician soon.  The x-ray of your hand is negative for fracture or dislocation.  Your examination is negative for any neurovascular deficit.  You may experience some  tingling and numbness of the tips of your fingers over the next 24 to 48 hours.  You may also noticed some stiffness present.  Your x-ray did show degenerative arthritis changes involving her hand.  Please see Dr. Aline Brochure or the orthopedic specialist of your choice for additional evaluation if any changes in your hand condition, problems, or concerns.

## 2018-01-16 NOTE — ED Triage Notes (Signed)
Pt states she smashed right hand in machine at work. Denies pain at this time, has full mobility of hand. No wounds presents

## 2018-01-16 NOTE — ED Provider Notes (Signed)
[facility] EMERGENCY DEPARTMENT Provider Note   CSN: [id number] Arrival date & time: 01/16/18  1600     History   Chief Complaint Chief Complaint  Patient presents with  . Hand Injury    HPI Kathryn [name] is a 47 y.o. female.  Patient is a 47 year old female who presents to the emergency department with a complaint of injury to the right hand.  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  Her hand got caught in this machine around 245 this afternoon.  The patient states since that time she has been having some tingling and at times throbbing of the tips of the fingers of the right hand.  She is right-hand dominant.  No other injury reported at this time.  The patient denies being on any anticoagulation medications.  She has not had any recent operations or procedures involving the right hand.  The history is provided by the patient.  Hand Injury      Past Medical History:  Diagnosis Date  . Anemia   . Anxiety   . Asthma   . Bipolar disorder ([place])   . Depression   . Diabetes mellitus   . Hypertension   . Schizoaffective disorder [facility])     Patient Active Problem List   Diagnosis Date Noted  . GERD (gastroesophageal reflux disease) 10/08/2015  . IBS (irritable colon syndrome) 10/08/2015  . Agitation 09/07/2015  . Lithium toxicity 09/07/2015  . Altered mental status   . Elevated liver enzymes 09/06/2015  . AKI (acute kidney injury) ([place]) 09/06/2015  . Diabetes ([place]) 08/22/2015  . Essential hypertension 08/22/2015  . Schizoaffective disorder ([place]) 02/08/2015  . PTSD (post-traumatic stress disorder) 02/08/2015  . Acute low back pain due to trauma 09/21/2012    Past Surgical History:  Procedure Laterality Date  . BREAST SURGERY Right    biopsy  . CESAREAN SECTION    . CHOLECYSTECTOMY  06/02/2012   Procedure: LAPAROSCOPIC CHOLECYSTECTOMY;  Surgeon: Jamesetta So, MD;  Location: [facility]  ORS;  Service: General;  Laterality: N/A;  Attempted laparoscopic cholecystectomy  . CHOLECYSTECTOMY  06/02/2012   Procedure: CHOLECYSTECTOMY;  Surgeon: Jamesetta So, MD;  Location: [facility] ORS;  Service: General;  Laterality: N/A;  converted to open at  0905  . ESOPHAGOGASTRODUODENOSCOPY (EGD) WITH PROPOFOL N/A 08/24/2015   Procedure: ESOPHAGOGASTRODUODENOSCOPY (EGD) WITH PROPOFOL;  Surgeon: Rogene Houston, MD;  Location: [facility] ENDO SUITE;  Service: Endoscopy;  Laterality: N/A;  1:10 - Ann to notify pt to arrive at 11:30  . tooth removal  2019   all teeth removed  . TUBAL LIGATION     X3     OB History    Gravida  3   Para  3   Term  3   Preterm      AB      Living  3     SAB      TAB      Ectopic      Multiple      Live Births               Home Medications    Prior to Admission medications   Medication Sig Start Date End Date Taking? Authorizing Provider  albuterol (PROVENTIL HFA;VENTOLIN HFA) 108 (90 Base) MCG/ACT inhaler Inhale 1-2 puffs into the lungs every 6 (six) hours as needed for wheezing or shortness of breath. 09/24/17  Triplett, Tammy, PA-C  aspirin EC 81 MG tablet Take 81 mg by mouth daily.    [provider]  atorvastatin (LIPITOR) 10 MG tablet Take 1 tablet by mouth daily. 05/04/15   [provider]  benztropine (COGENTIN) 1 MG tablet Take 1 tablet (1 mg total) by mouth at bedtime. 10/28/17 10/28/18  Cloria Spring, MD  BYDUREON BCISE 2 MG/0.85ML AUIJ Inject 2 mg into the skin. Once a week on Mondays 08/12/16   [provider]  CARVEDILOL PO Take 12.5 mg by mouth 2 (two) times daily.     [provider]  dicyclomine (BENTYL) 10 MG capsule Take 1 capsule (10 mg total) by mouth 3 (three) times daily before meals. 10/07/16   Rogene Houston, MD  DULoxetine (CYMBALTA) 60 MG capsule Take 1 capsule (60 mg total) by mouth daily at 6 PM. 10/28/17   Cloria Spring, MD  famotidine (PEPCID) 20 MG tablet Take 1 tablet (20 mg total) by  mouth 2 (two) times daily. 02/16/17   Rolland Porter, MD  gabapentin (NEURONTIN) 300 MG capsule Take 300 mg by mouth 3 (three) times daily.  03/28/15   [provider]  Insulin Glargine (TOUJEO MAX SOLOSTAR [place]) Inject 150 Units into the skin daily.     [provider]  insulin lispro (HUMALOG) 100 UNIT/ML injection Inject 15 Units into the skin 3 (three) times daily before meals.     [provider]  levothyroxine (SYNTHROID, LEVOTHROID) 25 MCG tablet Take 25 mcg by mouth daily before breakfast.  04/15/15   [provider]  LINZESS 145 MCG CAPS capsule Take 145 mcg by mouth daily before breakfast.  02/04/17   [provider]  losartan (COZAAR) 25 MG tablet Take 25 mg daily by mouth.    [provider]  magic mouthwash w/lidocaine SOLN Take 5 mLs by mouth 3 (three) times daily as needed for mouth pain. 09/24/17   Triplett, Tammy, PA-C  omeprazole (PRILOSEC) 20 MG capsule Take 1 capsule (20 mg total) by mouth daily before supper. 04/08/16   [name], Mechele Dawley, MD  ondansetron (ZOFRAN) 4 MG tablet Take 1 tablet (4 mg total) by mouth every 8 (eight) hours as needed. 02/16/17   Rolland Porter, MD  potassium chloride (K-DUR) 10 MEQ tablet Take 1 tablet (10 mEq total) by mouth 2 (two) times daily. 11/17/17   Rancour, Annie Main, MD  risperiDONE (RISPERDAL) 2 MG tablet Take 2 tablets (4 mg total) by mouth at bedtime. 10/28/17 10/28/18  Cloria Spring, MD  traMADol (ULTRAM) 50 MG tablet Take 1 tablet (50 mg total) by mouth every 6 (six) hours as needed. 01/26/16   Triplett, Tammy, PA-C  traZODone (DESYREL) 100 MG tablet Take 2 tablets (200 mg total) by mouth at bedtime. 10/28/17   Cloria Spring, MD  Vitamin D, Ergocalciferol, (DRISDOL) 50000 units CAPS capsule Take 50,000 Units by mouth every Sunday.     [provider]    Family History Family History  Problem Relation Age of Onset  . Hypertension Mother   . Alcohol abuse Mother   . Hypertension Father   . Alcohol  abuse Sister   . Stroke Other   . Diabetes Other   . Cancer Other   . Seizures Other   . Alcohol abuse Maternal Aunt   . Alcohol abuse Paternal Aunt   . Alcohol abuse Maternal Grandfather   . Alcohol abuse Maternal Grandmother   . Alcohol abuse Cousin     Social  History Social History   Tobacco Use  . Smoking status: Never Smoker  . Smokeless tobacco: Never Used  Substance Use Topics  . Alcohol use: No    Alcohol/week: 0.0 standard drinks  . Drug use: No     Allergies   Patient has no known allergies.   Review of Systems Review of Systems  Constitutional: Negative for activity change.       All ROS Neg except as noted in HPI  HENT: Negative for nosebleeds.   Eyes: Negative for photophobia and discharge.  Respiratory: Negative for cough, shortness of breath and wheezing.   Cardiovascular: Negative for chest pain and palpitations.  Gastrointestinal: Negative for abdominal pain and blood in stool.  Genitourinary: Negative for dysuria, frequency and hematuria.  Musculoskeletal: Positive for arthralgias. Negative for back pain and neck pain.       Hand injury  Skin: Negative.   Neurological: Negative for dizziness, seizures and speech difficulty.  Psychiatric/Behavioral: Negative for confusion and hallucinations.     Physical Exam Updated Vital Signs BP (!) 184/94   Pulse 65   Temp 99.1 F (37.3 C) (Oral)   Resp 18   Ht 5\' 1"  (1.549 m)   Wt 83.9 kg   LMP 01/23/2013   SpO2 99%   BMI 34.96 kg/m   Physical Exam  Constitutional: She is oriented to person, place, and time. She appears well-developed and well-nourished.  Non-toxic appearance.  HENT:  Head: Normocephalic.  Right Ear: Tympanic membrane and external ear normal.  Left Ear: Tympanic membrane and external ear normal.  Eyes: Pupils are equal, round, and reactive to light. EOM and lids are normal.  Neck: Normal range of motion. Neck supple. Carotid bruit is not present.  Cardiovascular: Normal rate,  regular rhythm, normal heart sounds, intact distal pulses and normal pulses.  Pulmonary/Chest: Breath sounds normal. No respiratory distress.  Abdominal: Soft. Bowel sounds are normal. There is no tenderness. There is no guarding.  Musculoskeletal: Normal range of motion.  There is full range of motion of the right shoulder and elbow.  There is full range of motion of the right wrist.  Capillary refill on the right is less than 2 seconds.  The radial pulses 2+ on the right.  The palmar arch is intact.  Compartments are soft.  There is no pain in the anatomical snuffbox.  There are no temperature changes noted of the right hand.  Lymphadenopathy:       Head (right side): No submandibular adenopathy present.       Head (left side): No submandibular adenopathy present.    She has no cervical adenopathy.  Neurological: She is alert and oriented to person, place, and time. She has normal strength. No cranial nerve deficit or sensory deficit.  Skin: Skin is warm and dry.  Psychiatric: She has a normal mood and affect. Her speech is normal.  Nursing note and vitals reviewed.    ED Treatments / Results  Labs (all labs ordered are listed, but only abnormal results are displayed) Labs Reviewed - No data to display  EKG None  Radiology Dg Hand Complete Right  Result Date: 01/16/2018 CLINICAL DATA:  Pain in the right hand. Injury at work striking a machine. EXAM: RIGHT HAND - COMPLETE 3+ VIEW COMPARISON:  01/22/2016 FINDINGS: Chronic subcortical lucency in the proximal lunate common no change from 01/22/2016, possibly a geode or subcortical cyst. There is some positive ulnar variance, but no findings in the ulna to further suggest ulnolunate abutment. Mild  degenerative spurring at the interphalangeal joint of the thumb. Stable small ossicles adjacent to the radial styloid and ulnar styloid. The pronator fat pad appears normal. No acute fracture is identified. IMPRESSION: 1. No acute bony findings. 2.  Stable subcortical cystic lesion with sclerotic margin along the proximal lunate, probably a geode. 3. Mild degenerative spurring at the interphalangeal joint of the thumb. Electronically Signed   By: Van Clines M.D.   On: 01/16/2018 16:44    Procedures Procedures (including critical care time)  Medications Ordered in ED Medications - No data to display   Initial Impression / Assessment and Plan / ED Course  I have reviewed the triage vital signs and the nursing notes.  Pertinent labs & imaging results that were available during my care of the patient were reviewed by me and considered in my medical decision making (see chart for details).       Final Clinical Impressions(s) / ED Diagnoses MDM  Blood pressure is elevated at 184/94 on admission to the emergency department.  I will asked the patient have this rechecked soon.  Patient injured the right hand on a roller system at work.  The x-ray is negative for fracture or dislocation.  There are noted some degenerative changes present, but no evidence of acute trauma.  There are no neurovascular deficits appreciated on examination.  I discussed the findings on the examination as well as the findings on the x-ray with the patient in terms of which she understands.  I have asked the patient use Tylenol extra strength, or ibuprofen every 6 hours as needed for soreness.  The patient is to follow-up with Dr. Aline Brochure or return to the emergency department if any changes in condition, problems, or concerns.  Patient is in agreement with this plan.   Final diagnoses:  Contusion of right hand, initial encounter  Primary osteoarthritis of right hand  Elevated blood pressure reading    ED Discharge Orders    None       Lily Kocher, PA-C 01/16/18 1719    Daleen Bo, MD 01/17/18 1137

## 2018-03-15 DIAGNOSIS — R69 Illness, unspecified: Secondary | ICD-10-CM | POA: Diagnosis not present

## 2018-03-19 ENCOUNTER — Encounter (HOSPITAL_COMMUNITY): Payer: Self-pay | Admitting: Psychiatry

## 2018-03-19 ENCOUNTER — Ambulatory Visit (INDEPENDENT_AMBULATORY_CARE_PROVIDER_SITE_OTHER): Payer: Medicare HMO | Admitting: Psychiatry

## 2018-03-19 VITALS — BP 152/90 | HR 86 | Ht 61.0 in | Wt 178.0 lb

## 2018-03-19 DIAGNOSIS — F4310 Post-traumatic stress disorder, unspecified: Secondary | ICD-10-CM | POA: Diagnosis not present

## 2018-03-19 DIAGNOSIS — G4700 Insomnia, unspecified: Secondary | ICD-10-CM

## 2018-03-19 DIAGNOSIS — F419 Anxiety disorder, unspecified: Secondary | ICD-10-CM

## 2018-03-19 DIAGNOSIS — F251 Schizoaffective disorder, depressive type: Secondary | ICD-10-CM | POA: Diagnosis not present

## 2018-03-19 MED ORDER — DULOXETINE HCL 60 MG PO CPEP: 60.0000 mg | ORAL_CAPSULE | Freq: Two times a day (BID) | ORAL | 2 refills | Status: DC

## 2018-03-19 MED ORDER — TRAZODONE HCL 100 MG PO TABS: 200.0000 mg | ORAL_TABLET | Freq: Every day | ORAL | 2 refills | Status: DC

## 2018-03-19 MED ORDER — BENZTROPINE MESYLATE 1 MG PO TABS: 1.0000 mg | ORAL_TABLET | Freq: Every day | ORAL | 2 refills | Status: DC

## 2018-03-19 MED ORDER — RISPERIDONE 2 MG PO TABS: 6.0000 mg | ORAL_TABLET | Freq: Every day | ORAL | 2 refills | Status: DC
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
## 2018-03-21 ENCOUNTER — Emergency Department (HOSPITAL_COMMUNITY): Payer: Medicare HMO

## 2018-03-21 ENCOUNTER — Encounter (HOSPITAL_COMMUNITY): Payer: Self-pay

## 2018-03-21 ENCOUNTER — Emergency Department (HOSPITAL_COMMUNITY)
Admission: EM | Admit: 2018-03-21 | Discharge: 2018-03-21 | Disposition: A | Discharge status: Home / Self Care | Payer: Medicare HMO | Attending: Emergency Medicine | Admitting: Emergency Medicine

## 2018-03-21 ENCOUNTER — Other Ambulatory Visit: Payer: Self-pay

## 2018-03-21 DIAGNOSIS — J45909 Unspecified asthma, uncomplicated: Secondary | ICD-10-CM | POA: Insufficient documentation

## 2018-03-21 DIAGNOSIS — Z794 Long term (current) use of insulin: Secondary | ICD-10-CM | POA: Insufficient documentation

## 2018-03-21 DIAGNOSIS — Z79899 Other long term (current) drug therapy: Secondary | ICD-10-CM | POA: Diagnosis not present

## 2018-03-21 DIAGNOSIS — E119 Type 2 diabetes mellitus without complications: Secondary | ICD-10-CM | POA: Insufficient documentation

## 2018-03-21 DIAGNOSIS — I10 Essential (primary) hypertension: Secondary | ICD-10-CM | POA: Diagnosis not present

## 2018-03-21 DIAGNOSIS — M25551 Pain in right hip: Secondary | ICD-10-CM | POA: Diagnosis not present

## 2018-03-21 MED ORDER — NAPROXEN 500 MG PO TABS: 500.0000 mg | ORAL_TABLET | Freq: Two times a day (BID) | ORAL | 0 refills | Status: AC

## 2018-03-21 MED ORDER — KETOROLAC TROMETHAMINE 30 MG/ML IJ SOLN
30.0000 mg | Freq: Once | INTRAMUSCULAR | Status: AC
  Administered 2018-03-21: 30 mg via INTRAMUSCULAR
  Filled 2018-03-21: qty 1

## 2018-03-21 NOTE — ED Provider Notes (Signed)
[facility] EMERGENCY DEPARTMENT Provider Note   CSN: [id number] Arrival date & time: 03/21/18  0827     History   Chief Complaint Chief Complaint  Patient presents with  . Hip Pain    HPI Kathryn [name] is a 47 y.o. female.  47 y.o female with a PMH of HTN, DM, Anxiety presents to the ED with a chief complaint of right hip pain x 1 week. Patient reports pain along right hip and right knee worse on the lateral aspect. She states her pain is worse with standing. She has tried ibuprofen but reports no relieve in symptoms. Patient reports a previous history of bursitis on her hips and reports this feels the same. She was seen by her PCP back then who provided her with an injection for relieve. She denies any fever, trauma, or urinary symptoms.      Past Medical History:  Diagnosis Date  . Anemia   . Anxiety   . Asthma   . Bipolar disorder ([place])   . Depression   . Diabetes mellitus   . Hypertension   . Schizoaffective disorder [facility])     Patient Active Problem List   Diagnosis Date Noted  . GERD (gastroesophageal reflux disease) 10/08/2015  . IBS (irritable colon syndrome) 10/08/2015  . Agitation 09/07/2015  . Lithium toxicity 09/07/2015  . Altered mental status   . Elevated liver enzymes 09/06/2015  . AKI (acute kidney injury) ([place]) 09/06/2015  . Diabetes ([place]) 08/22/2015  . Essential hypertension 08/22/2015  . Schizoaffective disorder ([place]) 02/08/2015  . PTSD (post-traumatic stress disorder) 02/08/2015  . Acute low back pain due to trauma 09/21/2012    Past Surgical History:  Procedure Laterality Date  . BREAST SURGERY Right    biopsy  . CESAREAN SECTION    . CHOLECYSTECTOMY  06/02/2012   Procedure: LAPAROSCOPIC CHOLECYSTECTOMY;  Surgeon: Jamesetta So, MD;  Location: [facility] ORS;  Service: General;  Laterality: N/A;  Attempted laparoscopic cholecystectomy  . CHOLECYSTECTOMY  06/02/2012   Procedure: CHOLECYSTECTOMY;  Surgeon: Jamesetta So, MD;  Location: [facility] ORS;   Service: General;  Laterality: N/A;  converted to open at  0905  . ESOPHAGOGASTRODUODENOSCOPY (EGD) WITH PROPOFOL N/A 08/24/2015   Procedure: ESOPHAGOGASTRODUODENOSCOPY (EGD) WITH PROPOFOL;  Surgeon: Rogene Houston, MD;  Location: [facility] ENDO SU[facility];  Service: Endoscopy;  Laterality: N/A;  1:10 - Ann to notify pt to arrive at 11:30  . tooth removal  2019   all teeth removed  . TUBAL LIGATION     X3     OB History    Gravida  3   Para  3   Term  3   Preterm      AB      Living  3     SAB      TAB      Ectopic      Multiple      Live Births               Home Medications    Prior to Admission medications   Medication Sig Start Date End Date Taking? Authorizing Provider  albuterol (PROVENTIL HFA;VENTOLIN HFA) 108 (90 Base) MCG/ACT inhaler Inhale 1-2 puffs into the lungs every 6 (six) hours as needed for wheezing or shortness of breath. 09/24/17   Triplett, Tammy, PA-C  aspirin EC 81 MG tablet Take 81 mg by mouth daily.    [provider]  atorvastatin (LIPITOR) 10 MG tablet Take 1 tablet by mouth daily.  05/04/15   [provider]  benztropine (COGENTIN) 1 MG tablet Take 1 tablet (1 mg total) by mouth at bedtime. 03/19/18 03/19/19  Cloria Spring, MD  BYDUREON BCISE 2 MG/0.85ML AUIJ Inject 2 mg into the skin. Once a week on Mondays 08/12/16   [provider]  CARVEDILOL PO Take 12.5 mg by mouth 2 (two) times daily.     [provider]  dicyclomine (BENTYL) 10 MG capsule Take 1 capsule (10 mg total) by mouth 3 (three) times daily before meals. 10/07/16   [name], Mechele Dawley, MD  DULoxetine (CYMBALTA) 60 MG capsule Take 1 capsule (60 mg total) by mouth 2 (two) times daily. 03/19/18   Cloria Spring, MD  famotidine (PEPCID) 20 MG tablet Take 1 tablet (20 mg total) by mouth 2 (two) times daily. 02/16/17   Rolland Porter, MD  gabapentin (NEURONTIN) 300 MG capsule Take 300 mg by mouth 3 (three) times daily.  03/28/15   [provider]  Insulin  Glargine (TOUJEO MAX SOLOSTAR [place]) Inject 150 Units into the skin daily.     [provider]  insulin lispro (HUMALOG) 100 UNIT/ML injection Inject 15 Units into the skin 3 (three) times daily before meals.     [provider]  levothyroxine (SYNTHROID, LEVOTHROID) 25 MCG tablet Take 25 mcg by mouth daily before breakfast.  04/15/15   [provider]  LINZESS 145 MCG CAPS capsule Take 145 mcg by mouth daily before breakfast.  02/04/17   [provider]  losartan (COZAAR) 25 MG tablet Take 25 mg daily by mouth.    [provider]  magic mouthwash w/lidocaine SOLN Take 5 mLs by mouth 3 (three) times daily as needed for mouth pain. 09/24/17   Triplett, Tammy, PA-C  naproxen (NAPROSYN) 500 MG tablet Take 1 tablet (500 mg total) by mouth 2 (two) times daily for 7 days. 03/21/18 03/28/18  Janeece Fitting, PA-C  omeprazole (PRILOSEC) 20 MG capsule Take 1 capsule (20 mg total) by mouth daily before supper. 04/08/16   [name], Mechele Dawley, MD  ondansetron (ZOFRAN) 4 MG tablet Take 1 tablet (4 mg total) by mouth every 8 (eight) hours as needed. 02/16/17   Rolland Porter, MD  potassium chloride (K-DUR) 10 MEQ tablet Take 1 tablet (10 mEq total) by mouth 2 (two) times daily. 11/17/17   Rancour, Annie Main, MD  risperiDONE (RISPERDAL) 2 MG tablet Take 3 tablets (6 mg total) by mouth at bedtime. 03/19/18 03/19/19  Cloria Spring, MD  traMADol (ULTRAM) 50 MG tablet Take 1 tablet (50 mg total) by mouth every 6 (six) hours as needed. 01/26/16   Triplett, Tammy, PA-C  traZODone (DESYREL) 100 MG tablet Take 2 tablets (200 mg total) by mouth at bedtime. 03/19/18   Cloria Spring, MD  Vitamin D, Ergocalciferol, (DRISDOL) 50000 units CAPS capsule Take 50,000 Units by mouth every Sunday.     [provider]    Family History Family History  Problem Relation Age of Onset  . Hypertension Mother   . Alcohol abuse Mother   . Hypertension Father   . Alcohol abuse Sister   . Stroke Other     . Diabetes Other   . Cancer Other   . Seizures Other   . Alcohol abuse Maternal Aunt   . Alcohol abuse Paternal Aunt   . Alcohol abuse Maternal Grandfather   . Alcohol abuse Maternal Grandmother   . Alcohol abuse Cousin     Social History Social History  Tobacco Use  . Smoking status: Never Smoker  . Smokeless tobacco: Never Used  Substance Use Topics  . Alcohol use: No    Alcohol/week: 0.0 standard drinks  . Drug use: No     Allergies   Patient has no known allergies.   Review of Systems Review of Systems  Constitutional: Negative for chills and fever.  Respiratory: Negative for shortness of breath.   Cardiovascular: Negative for chest pain.  Gastrointestinal: Negative for abdominal pain.  Genitourinary: Negative for dysuria and flank pain.  Musculoskeletal: Positive for arthralgias and myalgias.  Skin: Negative for pallor and wound.     Physical Exam Updated Vital Signs BP (!) 161/84 (BP Location: Right Arm)   Pulse 74   Temp 99.1 F (37.3 C) (Oral)   Resp 18   Ht 5\' 1"  (1.549 m)   Wt 76.4 kg   LMP 01/23/2013   SpO2 98%   BMI 31.84 kg/m   Physical Exam  Constitutional: She is oriented to person, place, and time. She appears well-developed and well-nourished.  HENT:  Head: Normocephalic and atraumatic.  Neck: Normal range of motion. Neck supple.  Cardiovascular: Normal heart sounds.  Pulmonary/Chest: Breath sounds normal.  Abdominal: There is no tenderness.  Musculoskeletal: She exhibits tenderness.       Right hip: She exhibits tenderness and bony tenderness. She exhibits normal range of motion and normal strength.       Lumbar back: Normal. She exhibits no tenderness, no bony tenderness, no pain and no spasm.       Legs: TTP on lateral aspect along IT band distribution. Pulses present.pain worse with flexion & extension.   Neurological: She is alert and oriented to person, place, and time.  Skin: Skin is warm and dry.  Psychiatric: She has a  normal mood and affect.  Nursing note and vitals reviewed.    ED Treatments / Results  Labs (all labs ordered are listed, but only abnormal results are displayed) Labs Reviewed - No data to display  EKG None  Radiology Dg Hip Unilat W Or Wo Pelvis 2-3 Views Right  Result Date: 03/21/2018 CLINICAL DATA:  Right hip pain for the past week. No known injury. EXAM: DG HIP (WITH OR WITHOUT PELVIS) 2-3V RIGHT COMPARISON:  05/07/2016. FINDINGS: Normal appearing right hip. No avascular necrosis seen. Mild lower lumbar spine facet degenerative changes. Stable left pelvic tubal ligation clips and bilateral pelvic phleboliths. IMPRESSION: Normal appearing right hip. Mild lower lumbar spine facet degenerative changes. Electronically Signed   By: Claudie Revering M.D.   On: 03/21/2018 09:42    Procedures Procedures (including critical care time)  Medications Ordered in ED Medications  ketorolac (TORADOL) 30 MG/ML injection 30 mg (has no administration in time range)     Initial Impression / Assessment and Plan / ED Course  I have reviewed the triage vital signs and the nursing notes.  Pertinent labs & imaging results that were available during my care of the patient were reviewed by me and considered in my medical decision making (see chart for details).    Patient presents with right hip pain worse with standing along the lateral portion of her leg. Will order xray to r/o any dislocation, fracture. Upon examination there is tenderness to palpation along the lateral aspect, high suspicion for ITB syndrome. Patient does have a previous history of bursitis which was treated with a "shot" by her physician. Will provide patient with Toradol IM while in the ED for the pain, I have  personally reviewed patient's record and checked her last creatine level.  DG right hip was within normal limits, no fracture, dislocation, AVN.  At this time I will discharge patient with NSAIDs to help with her pain and have  her follow-up with her PCP.  Vernard Gambles is afebrile during ED visit, vitals stable for discharge, patient stable for discharge.  Return precautions provided at length.  Final Clinical Impressions(s) / ED Diagnoses   Final diagnoses:  Right hip pain    ED Discharge Orders         Ordered    naproxen (NAPROSYN) 500 MG tablet  2 times daily     03/21/18 0949           Janeece Fitting, PA-C 03/21/18 0910    Fredia Sorrow, MD 03/21/18 1739

## 2018-03-21 NOTE — ED Triage Notes (Signed)
Pt reports pain in r hip x  1 week.  Denies injury

## 2018-03-21 NOTE — Discharge Instructions (Signed)
I have prescribed antiinflammatories to help with your symptoms. You mau also apply head to the area to help with symptoms. All imaging today was normal. Please follow up with your primary care physician as needed. If you experience any worsening symptoms, fever, unable to walk you may return to the ED

## 2018-03-23 ENCOUNTER — Telehealth (HOSPITAL_COMMUNITY): Payer: Self-pay | Admitting: *Deleted

## 2018-03-23 NOTE — Telephone Encounter (Signed)
[employer] Approved Authorization Risperidone 2 mg Tablets Good Until 05/25/18

## 2018-04-09 ENCOUNTER — Encounter (HOSPITAL_COMMUNITY): Payer: Self-pay | Admitting: Psychiatry

## 2018-04-09 ENCOUNTER — Ambulatory Visit (INDEPENDENT_AMBULATORY_CARE_PROVIDER_SITE_OTHER): Payer: Medicare Other | Admitting: Psychiatry

## 2018-04-09 VITALS — BP 162/90 | HR 100 | Ht 61.0 in | Wt 178.0 lb

## 2018-04-09 DIAGNOSIS — F251 Schizoaffective disorder, depressive type: Secondary | ICD-10-CM

## 2018-04-09 DIAGNOSIS — F4310 Post-traumatic stress disorder, unspecified: Secondary | ICD-10-CM | POA: Diagnosis not present

## 2018-04-09 MED ORDER — BENZTROPINE MESYLATE 1 MG PO TABS: 1.0000 mg | ORAL_TABLET | Freq: Every day | ORAL | 2 refills | Status: DC

## 2018-04-09 MED ORDER — RISPERIDONE 2 MG PO TABS: 6.0000 mg | ORAL_TABLET | Freq: Every day | ORAL | 2 refills | Status: DC

## 2018-04-09 MED ORDER — DULOXETINE HCL 60 MG PO CPEP: 60.0000 mg | ORAL_CAPSULE | Freq: Two times a day (BID) | ORAL | 2 refills | Status: DC

## 2018-04-09 MED ORDER — TRAZODONE HCL 100 MG PO TABS: 200.0000 mg | ORAL_TABLET | Freq: Every day | ORAL | 2 refills | Status: DC
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
## 2018-04-12 DIAGNOSIS — E039 Hypothyroidism, unspecified: Secondary | ICD-10-CM | POA: Diagnosis not present

## 2018-04-12 DIAGNOSIS — E559 Vitamin D deficiency, unspecified: Secondary | ICD-10-CM | POA: Diagnosis not present

## 2018-04-12 DIAGNOSIS — Z23 Encounter for immunization: Secondary | ICD-10-CM | POA: Diagnosis not present

## 2018-04-12 DIAGNOSIS — I10 Essential (primary) hypertension: Secondary | ICD-10-CM | POA: Diagnosis not present

## 2018-04-12 DIAGNOSIS — R5383 Other fatigue: Secondary | ICD-10-CM | POA: Diagnosis not present

## 2018-04-12 DIAGNOSIS — E119 Type 2 diabetes mellitus without complications: Secondary | ICD-10-CM | POA: Diagnosis not present

## 2018-04-12 DIAGNOSIS — D649 Anemia, unspecified: Secondary | ICD-10-CM | POA: Diagnosis not present

## 2018-04-12 DIAGNOSIS — E785 Hyperlipidemia, unspecified: Secondary | ICD-10-CM | POA: Diagnosis not present

## 2018-04-14 DIAGNOSIS — E669 Obesity, unspecified: Secondary | ICD-10-CM | POA: Diagnosis not present

## 2018-04-14 DIAGNOSIS — I10 Essential (primary) hypertension: Secondary | ICD-10-CM | POA: Diagnosis not present

## 2018-04-14 DIAGNOSIS — E1165 Type 2 diabetes mellitus with hyperglycemia: Secondary | ICD-10-CM | POA: Diagnosis not present

## 2018-04-26 DIAGNOSIS — E1165 Type 2 diabetes mellitus with hyperglycemia: Secondary | ICD-10-CM | POA: Diagnosis not present

## 2018-04-26 DIAGNOSIS — E039 Hypothyroidism, unspecified: Secondary | ICD-10-CM | POA: Diagnosis not present

## 2018-04-26 DIAGNOSIS — E669 Obesity, unspecified: Secondary | ICD-10-CM | POA: Diagnosis not present

## 2018-05-03 ENCOUNTER — Other Ambulatory Visit (HOSPITAL_COMMUNITY): Payer: Self-pay | Admitting: Psychiatry

## 2018-05-17 ENCOUNTER — Other Ambulatory Visit (HOSPITAL_COMMUNITY): Payer: Self-pay | Admitting: Psychiatry

## 2018-05-17 ENCOUNTER — Telehealth (HOSPITAL_COMMUNITY): Payer: Self-pay | Admitting: *Deleted

## 2018-05-17 NOTE — Telephone Encounter (Signed)
I believe she should have two refills left per chart. Could you verify this with pharmacy? Thanks,

## 2018-05-17 NOTE — Telephone Encounter (Addendum)
[employer] has Denied Medicare Part D Prescription Drug  Coverage  Risperidone 2 mg  Tablet  Their Records indicate that [place] has another insurance plan that should be the Primary Payor for this Claim

## 2018-05-17 NOTE — Telephone Encounter (Signed)
Dr Modesta Messing Yes she did have 2 refills on hold. Requested for Rx to process & notified patient

## 2018-05-17 NOTE — Telephone Encounter (Signed)
Dr Modesta Messing Dr [facility] patient has called requesting refill on Cymbalta

## 2018-05-22 ENCOUNTER — Other Ambulatory Visit (HOSPITAL_COMMUNITY): Payer: Self-pay | Admitting: Psychiatry

## 2018-05-25 ENCOUNTER — Telehealth (HOSPITAL_COMMUNITY): Payer: Self-pay | Admitting: *Deleted

## 2018-05-25 NOTE — Telephone Encounter (Signed)
Patient called to check on refills for Cogentin. 2 Refills already @ Rx

## 2018-06-07 DIAGNOSIS — R5383 Other fatigue: Secondary | ICD-10-CM | POA: Diagnosis not present

## 2018-06-07 DIAGNOSIS — I10 Essential (primary) hypertension: Secondary | ICD-10-CM | POA: Diagnosis not present

## 2018-06-07 DIAGNOSIS — E1165 Type 2 diabetes mellitus with hyperglycemia: Secondary | ICD-10-CM | POA: Diagnosis not present

## 2018-06-07 DIAGNOSIS — E039 Hypothyroidism, unspecified: Secondary | ICD-10-CM | POA: Diagnosis not present

## 2018-06-11 ENCOUNTER — Ambulatory Visit (INDEPENDENT_AMBULATORY_CARE_PROVIDER_SITE_OTHER): Payer: Medicare Other | Admitting: Psychiatry

## 2018-06-11 ENCOUNTER — Encounter (HOSPITAL_COMMUNITY): Payer: Self-pay | Admitting: Psychiatry

## 2018-06-11 VITALS — BP 136/82 | HR 93 | Ht 61.0 in | Wt 178.0 lb

## 2018-06-11 DIAGNOSIS — F251 Schizoaffective disorder, depressive type: Secondary | ICD-10-CM | POA: Diagnosis not present

## 2018-06-11 DIAGNOSIS — F4310 Post-traumatic stress disorder, unspecified: Secondary | ICD-10-CM

## 2018-06-11 MED ORDER — TRAZODONE HCL 100 MG PO TABS: 200.0000 mg | ORAL_TABLET | Freq: Every day | ORAL | 2 refills | Status: DC

## 2018-06-11 MED ORDER — BENZTROPINE MESYLATE 1 MG PO TABS: 1.0000 mg | ORAL_TABLET | Freq: Every day | ORAL | 2 refills | Status: DC

## 2018-06-11 MED ORDER — RISPERIDONE 2 MG PO TABS: 6.0000 mg | ORAL_TABLET | Freq: Every day | ORAL | 2 refills | Status: DC

## 2018-06-11 MED ORDER — DULOXETINE HCL 60 MG PO CPEP: 60.0000 mg | ORAL_CAPSULE | Freq: Two times a day (BID) | ORAL | 2 refills | Status: DC
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
## 2018-07-20 DIAGNOSIS — E559 Vitamin D deficiency, unspecified: Secondary | ICD-10-CM | POA: Diagnosis not present

## 2018-07-20 DIAGNOSIS — I10 Essential (primary) hypertension: Secondary | ICD-10-CM | POA: Diagnosis not present

## 2018-07-20 DIAGNOSIS — E1165 Type 2 diabetes mellitus with hyperglycemia: Secondary | ICD-10-CM | POA: Diagnosis not present

## 2018-07-20 DIAGNOSIS — E119 Type 2 diabetes mellitus without complications: Secondary | ICD-10-CM | POA: Diagnosis not present

## 2018-07-20 DIAGNOSIS — R1310 Dysphagia, unspecified: Secondary | ICD-10-CM | POA: Diagnosis not present

## 2018-08-02 ENCOUNTER — Ambulatory Visit (INDEPENDENT_AMBULATORY_CARE_PROVIDER_SITE_OTHER): Payer: Self-pay | Admitting: Internal Medicine

## 2018-08-03 DIAGNOSIS — I10 Essential (primary) hypertension: Secondary | ICD-10-CM | POA: Diagnosis not present

## 2018-08-11 ENCOUNTER — Ambulatory Visit (INDEPENDENT_AMBULATORY_CARE_PROVIDER_SITE_OTHER): Payer: Self-pay | Admitting: Internal Medicine

## 2018-08-13 ENCOUNTER — Encounter (INDEPENDENT_AMBULATORY_CARE_PROVIDER_SITE_OTHER): Payer: Self-pay | Admitting: Internal Medicine

## 2018-08-18 ENCOUNTER — Ambulatory Visit (INDEPENDENT_AMBULATORY_CARE_PROVIDER_SITE_OTHER): Payer: Self-pay | Admitting: Internal Medicine

## 2018-08-22 ENCOUNTER — Other Ambulatory Visit (HOSPITAL_COMMUNITY): Payer: Self-pay | Admitting: Psychiatry

## 2018-09-10 ENCOUNTER — Ambulatory Visit (INDEPENDENT_AMBULATORY_CARE_PROVIDER_SITE_OTHER): Payer: Medicare Other | Admitting: Psychiatry

## 2018-09-10 ENCOUNTER — Ambulatory Visit (HOSPITAL_COMMUNITY): Payer: Medicare Other | Admitting: Psychiatry

## 2018-09-10 ENCOUNTER — Encounter (HOSPITAL_COMMUNITY): Payer: Self-pay | Admitting: Psychiatry

## 2018-09-10 ENCOUNTER — Other Ambulatory Visit: Payer: Self-pay

## 2018-09-10 DIAGNOSIS — Z62810 Personal history of physical and sexual abuse in childhood: Secondary | ICD-10-CM

## 2018-09-10 DIAGNOSIS — F4310 Post-traumatic stress disorder, unspecified: Secondary | ICD-10-CM | POA: Diagnosis not present

## 2018-09-10 DIAGNOSIS — F251 Schizoaffective disorder, depressive type: Secondary | ICD-10-CM | POA: Diagnosis not present

## 2018-09-10 DIAGNOSIS — Z915 Personal history of self-harm: Secondary | ICD-10-CM | POA: Diagnosis not present

## 2018-09-10 DIAGNOSIS — Z79899 Other long term (current) drug therapy: Secondary | ICD-10-CM

## 2018-09-10 MED ORDER — RISPERIDONE 2 MG PO TABS: ORAL_TABLET | ORAL | 2 refills | Status: DC

## 2018-09-10 MED ORDER — TRAZODONE HCL 100 MG PO TABS: 200.0000 mg | ORAL_TABLET | Freq: Every day | ORAL | 2 refills | Status: DC

## 2018-09-10 MED ORDER — DULOXETINE HCL 60 MG PO CPEP: 60.0000 mg | ORAL_CAPSULE | Freq: Two times a day (BID) | ORAL | 2 refills | Status: DC

## 2018-09-10 MED ORDER — BENZTROPINE MESYLATE 1 MG PO TABS: 1.0000 mg | ORAL_TABLET | Freq: Every day | ORAL | 2 refills | Status: DC
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
## 2018-09-21 ENCOUNTER — Encounter (INDEPENDENT_AMBULATORY_CARE_PROVIDER_SITE_OTHER): Payer: Self-pay | Admitting: Internal Medicine

## 2018-09-21 ENCOUNTER — Encounter (HOSPITAL_COMMUNITY): Payer: Self-pay | Admitting: Emergency Medicine

## 2018-09-21 ENCOUNTER — Emergency Department (HOSPITAL_COMMUNITY)
Admission: EM | Admit: 2018-09-21 | Discharge: 2018-09-21 | Disposition: A | Discharge status: Home / Self Care | Payer: Medicare Other | Attending: Emergency Medicine | Admitting: Emergency Medicine

## 2018-09-21 ENCOUNTER — Other Ambulatory Visit: Payer: Self-pay

## 2018-09-21 ENCOUNTER — Ambulatory Visit (INDEPENDENT_AMBULATORY_CARE_PROVIDER_SITE_OTHER): Payer: Medicare Other | Admitting: Internal Medicine

## 2018-09-21 DIAGNOSIS — J45909 Unspecified asthma, uncomplicated: Secondary | ICD-10-CM | POA: Insufficient documentation

## 2018-09-21 DIAGNOSIS — J029 Acute pharyngitis, unspecified: Secondary | ICD-10-CM | POA: Diagnosis present

## 2018-09-21 DIAGNOSIS — R1310 Dysphagia, unspecified: Secondary | ICD-10-CM | POA: Diagnosis not present

## 2018-09-21 DIAGNOSIS — Z79899 Other long term (current) drug therapy: Secondary | ICD-10-CM | POA: Insufficient documentation

## 2018-09-21 DIAGNOSIS — E119 Type 2 diabetes mellitus without complications: Secondary | ICD-10-CM | POA: Diagnosis not present

## 2018-09-21 DIAGNOSIS — R1319 Other dysphagia: Secondary | ICD-10-CM

## 2018-09-21 DIAGNOSIS — I10 Essential (primary) hypertension: Secondary | ICD-10-CM | POA: Diagnosis not present

## 2018-09-21 MED ORDER — AMOXICILLIN 500 MG PO CAPS: 500.0000 mg | ORAL_CAPSULE | Freq: Three times a day (TID) | ORAL | 0 refills | Status: DC

## 2018-09-21 MED ORDER — ACETAMINOPHEN 500 MG PO TABS
1000.0000 mg | ORAL_TABLET | Freq: Once | ORAL | Status: AC
  Administered 2018-09-21: 1000 mg via ORAL
  Filled 2018-09-21: qty 2

## 2018-09-21 NOTE — ED Triage Notes (Signed)
Pt c/o sore throat starting on Sunday. Denies fever

## 2018-09-21 NOTE — Progress Notes (Signed)
Subjective:    Patient ID: Kathryn [name], female    DOB: 1971/02/21, 48 y.o.   MRN: [id number]   PCP Dr. Anastasio Champion. Start time 1:45pm- 2:03pm Total time 18 minutes.  This is a telephone OV. Patient consent to telephone OV with me. Patient is at home. I am in the office. OV due to COVID-12 risk.  Unable to do video OV.  HPI Reason for visit: dysphagia.  She tells me today she has bee having dysphagia.  When she eats, her esophagus feels like it is going to close. Foods are lodging in her esophagus. Symptoms x 3 weeks. No foods in particular bother her.  Her appetite has slowed down since she became sick with the strep throat.  Having a BM x 1 a day.    Seen in the ED day and diagnosed with strep throat today. Given an Rx for Amoxicillin. She has not had a fever. She was treated and released.    Last EGD was in March of 2017 (abdominal distention, nausea, vomiting).  Impression:               - Normal esophagus.                           - Z-line regular, 34 cm from the incisors.                           - Granular antral gastric mucosa. Biopsied.                           - The examination was otherwise normal.                           - Normal duodenal bulb and second portion of the                            duodenum.   Review of Systems Past Medical History:  Diagnosis Date  . Anemia   . Anxiety   . Asthma   . Bipolar disorder ([place])   . Depression   . Diabetes mellitus   . Hypertension   . Schizoaffective disorder [facility])     Past Surgical History:  Procedure Laterality Date  . BREAST SURGERY Right    biopsy  . CESAREAN SECTION    . CHOLECYSTECTOMY  06/02/2012   Procedure: LAPAROSCOPIC CHOLECYSTECTOMY;  Surgeon: Jamesetta So, MD;  Location: [facility] ORS;  Service: General;  Laterality: N/A;  Attempted laparoscopic cholecystectomy  . CHOLECYSTECTOMY  06/02/2012   Procedure: CHOLECYSTECTOMY;  Surgeon: Jamesetta So, MD;  Location: [facility] ORS;  Service: General;  Laterality: N/A;   converted to open at  0905  . ESOPHAGOGASTRODUODENOSCOPY (EGD) WITH PROPOFOL N/A 08/24/2015   Procedure: ESOPHAGOGASTRODUODENOSCOPY (EGD) WITH PROPOFOL;  Surgeon: Rogene Houston, MD;  Location: [facility];  Service: Endoscopy;  Laterality: N/A;  1:10 - Ann to notify pt to arrive at 11:30  . tooth removal  2019   all teeth removed  . TUBAL LIGATION     X3    No Known Allergies  Current Outpatient Medications on File Prior to Visit  Medication Sig Dispense Refill  . albuterol (PROVENTIL HFA;VENTOLIN HFA) 108 (90 Base) MCG/ACT inhaler Inhale 1-2 puffs into the lungs every 6 (six) hours as  needed for wheezing or shortness of breath. 1 Inhaler 0  . aspirin EC 81 MG tablet Take 81 mg by mouth daily.    Marland Kitchen atorvastatin (LIPITOR) 10 MG tablet Take 1 tablet by mouth daily.    . benztropine (COGENTIN) 1 MG tablet Take 1 tablet (1 mg total) by mouth at bedtime. 30 tablet 2  . BYDUREON BCISE 2 MG/0.85ML AUIJ Inject 2 mg into the skin. Once a week on Mondays    . CARVEDILOL PO Take 12.5 mg by mouth 2 (two) times daily.     Marland Kitchen dicyclomine (BENTYL) 10 MG capsule Take 1 capsule (10 mg total) by mouth 3 (three) times daily before meals. 90 capsule 2  . DULoxetine (CYMBALTA) 60 MG capsule Take 1 capsule (60 mg total) by mouth 2 (two) times daily. 60 capsule 2  . famotidine (PEPCID) 20 MG tablet Take 1 tablet (20 mg total) by mouth 2 (two) times daily. 20 tablet 0  . gabapentin (NEURONTIN) 300 MG capsule Take 300 mg by mouth 3 (three) times daily.     . Insulin Glargine (TOUJEO MAX SOLOSTAR [place]) Inject 150 Units into the skin daily.     Marland Kitchen levothyroxine (SYNTHROID, LEVOTHROID) 25 MCG tablet Take 25 mcg by mouth daily before breakfast.     . LINZESS 145 MCG CAPS capsule Take 145 mcg by mouth daily before breakfast.     . losartan (COZAAR) 25 MG tablet Take 25 mg daily by mouth.    Marland Kitchen omeprazole (PRILOSEC) 20 MG capsule Take 1 capsule (20 mg total) by mouth daily before supper.    . ondansetron (ZOFRAN) 4 MG  tablet Take 1 tablet (4 mg total) by mouth every 8 (eight) hours as needed. 6 tablet 0  . potassium chloride (K-DUR) 10 MEQ tablet Take 1 tablet (10 mEq total) by mouth 2 (two) times daily. 10 tablet 0  . risperiDONE (RISPERDAL) 2 MG tablet TAKE 3 TABLETS(6 MG) BY MOUTH AT BEDTIME 90 tablet 2  . traMADol (ULTRAM) 50 MG tablet Take 1 tablet (50 mg total) by mouth every 6 (six) hours as needed. 10 tablet 0  . traZODone (DESYREL) 100 MG tablet Take 2 tablets (200 mg total) by mouth at bedtime. 60 tablet 2  . Vitamin D, Ergocalciferol, (DRISDOL) 50000 units CAPS capsule Take 50,000 Units by mouth every Sunday.      No current facility-administered medications on file prior to visit.         Objective:   Physical Exam .  Deferred.      Assessment & Plan:  Dysphagia. Am going to get an Esophagram.  Further recommendations to follow.

## 2018-09-21 NOTE — ED Provider Notes (Signed)
[facility] EMERGENCY DEPARTMENT Provider Note   CSN: [id number] Arrival date & time: 09/21/18  0537    History   Chief Complaint Chief Complaint  Patient presents with   Sore Throat    HPI Kathryn [name] is a 48 y.o. female.     Patient c/o sore throat for the past 1-2 days. Symptoms acute onset, moderate, persistent, hurts to swallow but is able to swallow/eat and drink. Pt denies unilateral pain or swelling. No sob. No cough. No runny nose. Denies known ill contacts, no known strep, mono, or covid exposure. No headache. No neck pain or stiffness. No abd pain or nvd. No rash. States felt warm earlier, but no fevers.   The history is provided by the patient.  Sore Throat  Pertinent negatives include no chest pain, no abdominal pain, no headaches and no shortness of breath.    Past Medical History:  Diagnosis Date   Anemia    Anxiety    Asthma    Bipolar disorder ([place])    Depression    Diabetes mellitus    Hypertension    Schizoaffective disorder [facility])     Patient Active Problem List   Diagnosis Date Noted   GERD (gastroesophageal reflux disease) 10/08/2015   IBS (irritable colon syndrome) 10/08/2015   Agitation 09/07/2015   Lithium toxicity 09/07/2015   Altered mental status    Elevated liver enzymes 09/06/2015   AKI (acute kidney injury) ([place]) 09/06/2015   Diabetes ([place]) 08/22/2015   Essential hypertension 08/22/2015   Schizoaffective disorder ([place]) 02/08/2015   PTSD (post-traumatic stress disorder) 02/08/2015   Acute low back pain due to trauma 09/21/2012    Past Surgical History:  Procedure Laterality Date   BREAST SURGERY Right    biopsy   CESAREAN SECTION     CHOLECYSTECTOMY  06/02/2012   Procedure: LAPAROSCOPIC CHOLECYSTECTOMY;  Surgeon: Jamesetta So, MD;  Location: [facility] ORS;  Service: General;  Laterality: N/A;  Attempted laparoscopic cholecystectomy   CHOLECYSTECTOMY  06/02/2012   Procedure: CHOLECYSTECTOMY;  Surgeon: Jamesetta So, MD;  Location: [facility] ORS;  Service: General;  Laterality: N/A;  converted to open at  8921   ESOPHAGOGASTRODUODENOSCOPY (EGD) WITH PROPOFOL N/A 08/24/2015   Procedure: ESOPHAGOGASTRODUODENOSCOPY (EGD) WITH PROPOFOL;  Surgeon: Rogene Houston, MD;  Location: [facility] ENDO SUITE;  Service: Endoscopy;  Laterality: N/A;  1:10 - Ann to notify pt to arrive at 11:30   tooth removal  2019   all teeth removed   TUBAL LIGATION     X3     OB History    Gravida  3   Para  3   Term  3   Preterm      AB      Living  3     SAB      TAB      Ectopic      Multiple      Live Births               Home Medications    Prior to Admission medications   Medication Sig Start Date End Date Taking? Authorizing Provider  albuterol (PROVENTIL HFA;VENTOLIN HFA) 108 (90 Base) MCG/ACT inhaler Inhale 1-2 puffs into the lungs every 6 (six) hours as needed for wheezing or shortness of breath. 09/24/17   Triplett, Tammy, PA-C  aspirin EC 81 MG tablet Take 81 mg by mouth daily.    [provider]  atorvastatin (LIPITOR) 10 MG tablet Take 1 tablet by mouth  daily. 05/04/15   [provider]  benztropine (COGENTIN) 1 MG tablet Take 1 tablet (1 mg total) by mouth at bedtime. 09/10/18 09/10/19  Cloria Spring, MD  BYDUREON BCISE 2 MG/0.85ML AUIJ Inject 2 mg into the skin. Once a week on Mondays 08/12/16   [provider]  CARVEDILOL PO Take 12.5 mg by mouth 2 (two) times daily.     [provider]  dicyclomine (BENTYL) 10 MG capsule Take 1 capsule (10 mg total) by mouth 3 (three) times daily before meals. 10/07/16   [name], Mechele Dawley, MD  DULoxetine (CYMBALTA) 60 MG capsule Take 1 capsule (60 mg total) by mouth 2 (two) times daily. 09/10/18   Cloria Spring, MD  famotidine (PEPCID) 20 MG tablet Take 1 tablet (20 mg total) by mouth 2 (two) times daily. 02/16/17   Rolland Porter, MD  gabapentin (NEURONTIN) 300 MG capsule Take 300 mg by mouth 3 (three) times daily.  03/28/15    [provider]  Insulin Glargine (TOUJEO MAX SOLOSTAR [place]) Inject 150 Units into the skin daily.     [provider]  insulin lispro (HUMALOG) 100 UNIT/ML injection Inject 15 Units into the skin 3 (three) times daily before meals.     [provider]  levothyroxine (SYNTHROID, LEVOTHROID) 25 MCG tablet Take 25 mcg by mouth daily before breakfast.  04/15/15   [provider]  LINZESS 145 MCG CAPS capsule Take 145 mcg by mouth daily before breakfast.  02/04/17   [provider]  losartan (COZAAR) 25 MG tablet Take 25 mg daily by mouth.    [provider]  magic mouthwash w/lidocaine SOLN Take 5 mLs by mouth 3 (three) times daily as needed for mouth pain. 09/24/17   Triplett, Tammy, PA-C  omeprazole (PRILOSEC) 20 MG capsule Take 1 capsule (20 mg total) by mouth daily before supper. 04/08/16   [name], Mechele Dawley, MD  ondansetron (ZOFRAN) 4 MG tablet Take 1 tablet (4 mg total) by mouth every 8 (eight) hours as needed. 02/16/17   Rolland Porter, MD  potassium chloride (K-DUR) 10 MEQ tablet Take 1 tablet (10 mEq total) by mouth 2 (two) times daily. 11/17/17   Rancour, Annie Main, MD  risperiDONE (RISPERDAL) 2 MG tablet TAKE 3 TABLETS(6 MG) BY MOUTH AT BEDTIME 09/10/18   Cloria Spring, MD  traMADol (ULTRAM) 50 MG tablet Take 1 tablet (50 mg total) by mouth every 6 (six) hours as needed. 01/26/16   Triplett, Tammy, PA-C  traZODone (DESYREL) 100 MG tablet Take 2 tablets (200 mg total) by mouth at bedtime. 09/10/18   Cloria Spring, MD  Vitamin D, Ergocalciferol, (DRISDOL) 50000 units CAPS capsule Take 50,000 Units by mouth every Sunday.     [provider]    Family History Family History  Problem Relation Age of Onset   Hypertension Mother    Alcohol abuse Mother    Hypertension Father    Alcohol abuse Sister    Stroke Other    Diabetes Other    Cancer Other    Seizures Other    Alcohol abuse Maternal Aunt    Alcohol abuse Paternal Aunt     Alcohol abuse Maternal Grandfather    Alcohol abuse Maternal Grandmother    Alcohol abuse Cousin     Social History Social History   Tobacco Use   Smoking status: Never Smoker   Smokeless tobacco: Never Used  Substance Use Topics   Alcohol use: No    Alcohol/week: 0.0 standard  drinks   Drug use: No     Allergies   Patient has no known allergies.   Review of Systems Review of Systems  Constitutional: Negative for chills.  HENT: Positive for sore throat. Negative for rhinorrhea and sinus pain.   Eyes: Negative for discharge and redness.  Respiratory: Negative for cough and shortness of breath.   Cardiovascular: Negative for chest pain.  Gastrointestinal: Negative for abdominal pain, diarrhea and vomiting.  Genitourinary: Negative for dysuria.  Musculoskeletal: Negative for back pain, neck pain and neck stiffness.  Skin: Negative for rash.  Neurological: Negative for headaches.     Physical Exam Updated Vital Signs BP (!) 132/93    Pulse (!) 105    Temp 98.6 F (37 C) (Oral)    Resp 18    Ht 1.575 m (5\' 2" )    Wt 74.8 kg    LMP 01/23/2013    SpO2 97%    BMI 30.18 kg/m   Physical Exam Vitals signs and nursing note reviewed.  Constitutional:      Appearance: Normal appearance. She is well-developed.  HENT:     Head: Atraumatic.     Nose: Nose normal.     Mouth/Throat:     Mouth: Mucous membranes are moist.     Pharynx: Posterior oropharyngeal erythema present.     Comments: Pharynx erythematous, scant exudate. No asymmetric swelling or abscess. No trismus.  Eyes:     General: No scleral icterus.    Conjunctiva/sclera: Conjunctivae normal.     Pupils: Pupils are equal, round, and reactive to light.  Neck:     Musculoskeletal: Normal range of motion and neck supple. No neck rigidity or muscular tenderness.     Trachea: No tracheal deviation.     Comments: Mild anterior cervical l/a. No post cervical l/a. No neck swelling or mass. No goiter. Trachea  midline.  Cardiovascular:     Rate and Rhythm: Normal rate and regular rhythm.     Pulses: Normal pulses.     Heart sounds: Normal heart sounds. No murmur. No friction rub. No gallop.   Pulmonary:     Effort: Pulmonary effort is normal. No respiratory distress.     Breath sounds: Normal breath sounds. No stridor.  Abdominal:     Palpations: Abdomen is soft.     Tenderness: There is no abdominal tenderness.     Comments: No hsm.   Musculoskeletal:     Right lower leg: No edema.     Left lower leg: No edema.  Skin:    General: Skin is warm and dry.     Capillary Refill: Capillary refill takes less than 2 seconds.     Findings: No rash.  Neurological:     Mental Status: She is alert.     Comments: Alert, speech normal. No dysphonia.   Psychiatric:        Mood and Affect: Mood normal.      ED Treatments / Results  Labs (all labs ordered are listed, but only abnormal results are displayed) Labs Reviewed - No data to display  EKG None  Radiology No results found.  Procedures Procedures (including critical care time)  Medications Ordered in ED Medications  acetaminophen (TYLENOL) tablet 1,000 mg (has no administration in time range)     Initial Impression / Assessment and Plan / ED Course  I have reviewed the triage vital signs and the nursing notes.  Pertinent labs & imaging results that were available during my care of the  patient were reviewed by me and considered in my medical decision making (see chart for details).  Confirmed nkda w pt.  No meds pta.   Acetaminophen po.  Po fluids.  rx for home, amox.   Reviewed nursing notes and prior charts for additional history.   Currently, hr 88, rr 14, pulse ox 99% room air.   Patient appears stable for d/c.     Final Clinical Impressions(s) / ED Diagnoses   Final diagnoses:  None    ED Discharge Orders    None       Lajean Saver, MD 09/21/18 [phone]

## 2018-09-21 NOTE — Discharge Instructions (Addendum)
It was our pleasure to provide your ER care today - we hope that you feel better.  Take amoxicillin as prescribed.   Take acetaminophen or ibuprofen as need.  You may also try throat lozenges as need for symptom relief.  Follow up with primary care doctor in 1 week if symptoms fail to improve/resolve.  Return to ER right  if worse, new symptoms, unable to swallow, trouble breathing, other concern.

## 2018-09-22 ENCOUNTER — Encounter (INDEPENDENT_AMBULATORY_CARE_PROVIDER_SITE_OTHER): Payer: Self-pay | Admitting: *Deleted

## 2018-09-30 ENCOUNTER — Other Ambulatory Visit (HOSPITAL_COMMUNITY): Payer: Self-pay | Admitting: Psychiatry

## 2018-10-06 ENCOUNTER — Ambulatory Visit: Payer: Self-pay | Admitting: Cardiology

## 2018-10-20 ENCOUNTER — Other Ambulatory Visit (HOSPITAL_COMMUNITY): Payer: Self-pay | Admitting: Psychiatry

## 2018-10-20 ENCOUNTER — Telehealth (HOSPITAL_COMMUNITY): Payer: Self-pay | Admitting: *Deleted

## 2018-10-20 MED ORDER — BENZTROPINE MESYLATE 1 MG PO TABS: 1.0000 mg | ORAL_TABLET | Freq: Every day | ORAL | 2 refills | Status: DC

## 2018-10-20 NOTE — Telephone Encounter (Signed)
sent 

## 2018-10-20 NOTE — Telephone Encounter (Signed)
Dr Harrington Challenger Patient called after Rx told her that they didn't receive a refill order for the benztropine (COGENTIN) 1 MG tablet. I called informed that I'm looking @ receipt of confirmation sent on 09/10/2018 @ 11:04 AM # 30 with 2-Refills. They then happen to find 1that is in the file/ on hold but no refills. Script may need to be sent for further refills

## 2018-10-25 ENCOUNTER — Ambulatory Visit (HOSPITAL_COMMUNITY): Admission: RE | Admit: 2018-10-25 | Payer: Medicare Other | Source: Ambulatory Visit

## 2018-10-29 ENCOUNTER — Other Ambulatory Visit: Payer: Self-pay

## 2018-10-29 ENCOUNTER — Ambulatory Visit (HOSPITAL_COMMUNITY)
Admission: RE | Admit: 2018-10-29 | Discharge: 2018-10-29 | Disposition: A | Discharge status: Home / Self Care | Payer: Medicare Other | Source: Ambulatory Visit | Attending: Internal Medicine | Admitting: Internal Medicine

## 2018-10-29 DIAGNOSIS — R1319 Other dysphagia: Secondary | ICD-10-CM

## 2018-10-29 DIAGNOSIS — R1310 Dysphagia, unspecified: Secondary | ICD-10-CM | POA: Diagnosis not present

## 2018-11-12 ENCOUNTER — Other Ambulatory Visit (HOSPITAL_COMMUNITY): Payer: Self-pay | Admitting: Psychiatry

## 2018-11-15 ENCOUNTER — Telehealth (INDEPENDENT_AMBULATORY_CARE_PROVIDER_SITE_OTHER): Payer: Self-pay | Admitting: Internal Medicine

## 2018-11-15 ENCOUNTER — Other Ambulatory Visit (INDEPENDENT_AMBULATORY_CARE_PROVIDER_SITE_OTHER): Payer: Self-pay | Admitting: Internal Medicine

## 2018-11-15 DIAGNOSIS — R933 Abnormal findings on diagnostic imaging of other parts of digestive tract: Secondary | ICD-10-CM

## 2018-11-15 NOTE — Telephone Encounter (Signed)
Kathryn [name], EGD, abnormal Esophagram.

## 2018-11-15 NOTE — Telephone Encounter (Signed)
err

## 2018-11-17 NOTE — Telephone Encounter (Signed)
Left message for patient to call me

## 2018-11-18 ENCOUNTER — Encounter (INDEPENDENT_AMBULATORY_CARE_PROVIDER_SITE_OTHER): Payer: Self-pay | Admitting: *Deleted

## 2018-11-18 NOTE — Telephone Encounter (Signed)
Letter mailed to patent

## 2018-11-23 ENCOUNTER — Encounter (INDEPENDENT_AMBULATORY_CARE_PROVIDER_SITE_OTHER): Payer: Self-pay | Admitting: *Deleted

## 2018-11-23 ENCOUNTER — Other Ambulatory Visit (INDEPENDENT_AMBULATORY_CARE_PROVIDER_SITE_OTHER): Payer: Self-pay | Admitting: *Deleted

## 2018-11-23 DIAGNOSIS — R933 Abnormal findings on diagnostic imaging of other parts of digestive tract: Secondary | ICD-10-CM | POA: Insufficient documentation

## 2018-12-03 ENCOUNTER — Other Ambulatory Visit (HOSPITAL_COMMUNITY): Payer: Self-pay | Admitting: Psychiatry

## 2018-12-10 ENCOUNTER — Other Ambulatory Visit: Payer: Self-pay

## 2018-12-10 ENCOUNTER — Encounter (HOSPITAL_COMMUNITY): Payer: Self-pay | Admitting: Psychiatry

## 2018-12-10 ENCOUNTER — Ambulatory Visit (INDEPENDENT_AMBULATORY_CARE_PROVIDER_SITE_OTHER): Payer: Medicare Other | Admitting: Psychiatry

## 2018-12-10 DIAGNOSIS — F251 Schizoaffective disorder, depressive type: Secondary | ICD-10-CM

## 2018-12-10 DIAGNOSIS — F4310 Post-traumatic stress disorder, unspecified: Secondary | ICD-10-CM

## 2018-12-10 MED ORDER — BENZTROPINE MESYLATE 1 MG PO TABS: 1.0000 mg | ORAL_TABLET | Freq: Every day | ORAL | 2 refills | Status: DC

## 2018-12-10 MED ORDER — TRAZODONE HCL 100 MG PO TABS: 200.0000 mg | ORAL_TABLET | Freq: Every day | ORAL | 2 refills | Status: DC

## 2018-12-10 MED ORDER — RISPERIDONE 2 MG PO TABS: ORAL_TABLET | ORAL | 2 refills | Status: DC

## 2018-12-10 MED ORDER — DULOXETINE HCL 60 MG PO CPEP: 60.0000 mg | ORAL_CAPSULE | Freq: Two times a day (BID) | ORAL | 2 refills | Status: DC
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
## 2018-12-27 ENCOUNTER — Other Ambulatory Visit (HOSPITAL_COMMUNITY)
Admission: RE | Admit: 2018-12-27 | Discharge: 2018-12-27 | Disposition: A | Discharge status: Home / Self Care | Payer: Medicare Other | Source: Ambulatory Visit | Attending: Internal Medicine | Admitting: Internal Medicine

## 2018-12-27 DIAGNOSIS — Z01812 Encounter for preprocedural laboratory examination: Secondary | ICD-10-CM | POA: Insufficient documentation

## 2018-12-27 DIAGNOSIS — Z20828 Contact with and (suspected) exposure to other viral communicable diseases: Secondary | ICD-10-CM | POA: Diagnosis not present

## 2018-12-27 LAB — SARS CORONAVIRUS 2 (TAT 6-24 HRS): SARS Coronavirus 2: NEGATIVE

## 2018-12-29 ENCOUNTER — Other Ambulatory Visit (INDEPENDENT_AMBULATORY_CARE_PROVIDER_SITE_OTHER): Payer: Self-pay | Admitting: *Deleted

## 2018-12-29 DIAGNOSIS — R933 Abnormal findings on diagnostic imaging of other parts of digestive tract: Secondary | ICD-10-CM

## 2018-12-29 NOTE — Patient Instructions (Signed)
Kathryn [name]  12/29/2018     @PREFPERIOPPHARMACY @   Your procedure is scheduled on   12/31/2018  Report to [facility] at  1300  (1:00)  P.M.  Call this number if you have problems the morning of surgery:  [phone]   Remember:  Follow the diet and prep instructions given to you by Dr Olevia Perches office.                       Take these medicines the morning of surgery with A SIP OF WATER  Carvedilol, cymbalta, pepcid, neurontin, levothyroxine, prilosec, zofran(if needed), ultram(if needed). Take 1/2 of your usual night time insulin dosage the night before your procedure. DO NOT take any medications for diabetes the morning of your procedure. Use your inhaler before you come.    Do not wear jewelry, make-up or nail polish.  Do not wear lotions, powders, or perfumes. Please wear deodorant and brush your teeth.  Do not shave 48 hours prior to surgery.  Men may shave face and neck.  Do not bring valuables to the hospital.  [facility] is not responsible for any belongings or valuables.  Contacts, dentures or bridgework may not be worn into surgery.  Leave your suitcase in the car.  After surgery it may be brought to your room.  For patients admitted to the hospital, discharge time will be determined by your treatment team.  Patients discharged the day of surgery will not be allowed to drive home.   Name and phone number of your driver:   famly Special instructions:  None   Please read over the following fact sheets that you were given. Anesthesia Post-op Instructions and Care and Recovery After Surgery       Upper Endoscopy, Adult, Care After This sheet gives you information about how to care for yourself after your procedure. Your health care provider may also give you more specific instructions. If you have problems or questions, contact your health care provider. What can I expect after the procedure? After the procedure, it is common to have:  A sore throat.   Mild stomach pain or discomfort.  Bloating.  Nausea. Follow these instructions at home:   Follow instructions from your health care provider about what to eat or drink after your procedure.  Return to your normal activities as told by your health care provider. Ask your health care provider what activities are safe for you.  Take over-the-counter and prescription medicines only as told by your health care provider.  Do not drive for 24 hours if you were given a sedative during your procedure.  Keep all follow-up visits as told by your health care provider. This is important. Contact a health care provider if you have:  A sore throat that lasts longer than one day.  Trouble swallowing. Get help right away if:  You vomit blood or your vomit looks like coffee grounds.  You have: ? A fever. ? Bloody, black, or tarry stools. ? A severe sore throat or you cannot swallow. ? Difficulty breathing. ? Severe pain in your chest or abdomen. Summary  After the procedure, it is common to have a sore throat, mild stomach discomfort, bloating, and nausea.  Do not drive for 24 hours if you were given a sedative during the procedure.  Follow instructions from your health care provider about what to eat or drink after your procedure.  Return to your normal activities as  told by your health care provider. This information is not intended to replace advice given to you by your health care provider. Make sure you discuss any questions you have with your health care provider. Document Released: 11/11/2011 Document Revised: 11/03/2017 Document Reviewed: 10/12/2017 Elsevier Patient Education  2020 [place] After These instructions provide you with information about caring for yourself after your procedure. Your health care provider may also give you more specific instructions. Your treatment has been planned according to current medical practices, but problems  sometimes occur. Call your health care provider if you have any problems or questions after your procedure. What can I expect after the procedure? After your procedure, you may:  Feel sleepy for several hours.  Feel clumsy and have poor balance for several hours.  Feel forgetful about what happened after the procedure.  Have poor judgment for several hours.  Feel nauseous or vomit.  Have a sore throat if you had a breathing tube during the procedure. Follow these instructions at home: For at least 24 hours after the procedure:      Have a responsible adult stay with you. It is important to have someone help care for you until you are awake and alert.  Rest as needed.  Do not: ? Participate in activities in which you could fall or become injured. ? Drive. ? Use heavy machinery. ? Drink alcohol. ? Take sleeping pills or medicines that cause drowsiness. ? Make important decisions or sign legal documents. ? Take care of children on your own. Eating and drinking  Follow the diet that is recommended by your health care provider.  If you vomit, drink water, juice, or soup when you can drink without vomiting.  Make sure you have little or no nausea before eating solid foods. General instructions  Take over-the-counter and prescription medicines only as told by your health care provider.  If you have sleep apnea, surgery and certain medicines can increase your risk for breathing problems. Follow instructions from your health care provider about wearing your sleep device: ? Anytime you are sleeping, including during daytime naps. ? While taking prescription pain medicines, sleeping medicines, or medicines that make you drowsy.  If you smoke, do not smoke without supervision.  Keep all follow-up visits as told by your health care provider. This is important. Contact a health care provider if:  You keep feeling nauseous or you keep vomiting.  You feel light-headed.  You  develop a rash.  You have a fever. Get help right away if:  You have trouble breathing. Summary  For several hours after your procedure, you may feel sleepy and have poor judgment.  Have a responsible adult stay with you for at least 24 hours or until you are awake and alert. This information is not intended to replace advice given to you by your health care provider. Make sure you discuss any questions you have with your health care provider. Document Released: 09/02/2015 Document Revised: 08/10/2017 Document Reviewed: 09/02/2015 Elsevier Patient Education  2020 [employer].

## 2018-12-30 ENCOUNTER — Encounter (HOSPITAL_COMMUNITY): Admission: RE | Payer: Self-pay | Source: Home / Self Care

## 2018-12-30 ENCOUNTER — Encounter (HOSPITAL_COMMUNITY)
Admission: RE | Admit: 2018-12-30 | Discharge: 2018-12-30 | Disposition: A | Discharge status: Home / Self Care | Payer: Medicare Other | Source: Ambulatory Visit | Attending: Internal Medicine | Admitting: Internal Medicine

## 2018-12-30 ENCOUNTER — Encounter (HOSPITAL_COMMUNITY): Payer: Self-pay

## 2018-12-30 ENCOUNTER — Other Ambulatory Visit: Payer: Self-pay

## 2018-12-30 ENCOUNTER — Ambulatory Visit (HOSPITAL_COMMUNITY): Admission: RE | Admit: 2018-12-30 | Payer: Medicare Other | Source: Home / Self Care | Admitting: Internal Medicine

## 2018-12-30 DIAGNOSIS — K219 Gastro-esophageal reflux disease without esophagitis: Secondary | ICD-10-CM | POA: Diagnosis not present

## 2018-12-30 DIAGNOSIS — F319 Bipolar disorder, unspecified: Secondary | ICD-10-CM | POA: Diagnosis not present

## 2018-12-30 DIAGNOSIS — E1140 Type 2 diabetes mellitus with diabetic neuropathy, unspecified: Secondary | ICD-10-CM | POA: Diagnosis not present

## 2018-12-30 DIAGNOSIS — I10 Essential (primary) hypertension: Secondary | ICD-10-CM | POA: Diagnosis not present

## 2018-12-30 DIAGNOSIS — J45909 Unspecified asthma, uncomplicated: Secondary | ICD-10-CM | POA: Diagnosis not present

## 2018-12-30 DIAGNOSIS — E039 Hypothyroidism, unspecified: Secondary | ICD-10-CM | POA: Diagnosis not present

## 2018-12-30 DIAGNOSIS — R933 Abnormal findings on diagnostic imaging of other parts of digestive tract: Secondary | ICD-10-CM | POA: Diagnosis not present

## 2018-12-30 DIAGNOSIS — F259 Schizoaffective disorder, unspecified: Secondary | ICD-10-CM | POA: Diagnosis not present

## 2018-12-30 DIAGNOSIS — Z79899 Other long term (current) drug therapy: Secondary | ICD-10-CM | POA: Diagnosis not present

## 2018-12-30 DIAGNOSIS — Z7982 Long term (current) use of aspirin: Secondary | ICD-10-CM | POA: Diagnosis not present

## 2018-12-30 DIAGNOSIS — Z794 Long term (current) use of insulin: Secondary | ICD-10-CM | POA: Diagnosis not present

## 2018-12-30 DIAGNOSIS — F419 Anxiety disorder, unspecified: Secondary | ICD-10-CM | POA: Diagnosis not present

## 2018-12-30 DIAGNOSIS — R1314 Dysphagia, pharyngoesophageal phase: Secondary | ICD-10-CM | POA: Diagnosis not present

## 2018-12-30 HISTORY — DX: Gastro-esophageal reflux disease without esophagitis: K21.9

## 2018-12-30 HISTORY — DX: Polyneuropathy, unspecified: G62.9

## 2018-12-30 LAB — BASIC METABOLIC PANEL
Anion gap: 8 (ref 5–15)
BUN: 16 mg/dL (ref 6–20)
CO2: 27 mmol/L (ref 22–32)
Calcium: 8.9 mg/dL (ref 8.9–10.3)
Chloride: 98 mmol/L (ref 98–111)
Creatinine, Ser: 0.87 mg/dL (ref 0.44–1.00)
GFR calc Af Amer: >60 mL/min (ref >60)
GFR calc non Af Amer: >60 mL/min (ref >60)
Glucose, Bld: 409 mg/dL — ABNORMAL HIGH (ref 70–99)
Potassium: 3.9 mmol/L (ref 3.5–5.1)
Sodium: 133 mmol/L — ABNORMAL LOW (ref 135–145)

## 2018-12-30 SURGERY — EGD (ESOPHAGOGASTRODUODENOSCOPY)
Anesthesia: Moderate Sedation

## 2018-12-30 MED ORDER — SODIUM CHLORIDE 0.9 % IV SOLN: INTRAVENOUS | Status: DC

## 2018-12-31 ENCOUNTER — Ambulatory Visit (HOSPITAL_COMMUNITY): Payer: Medicare Other | Admitting: Anesthesiology

## 2018-12-31 ENCOUNTER — Encounter (HOSPITAL_COMMUNITY)
Admission: RE | Disposition: A | Discharge status: Home / Self Care | Payer: Self-pay | Source: Home / Self Care | Attending: Internal Medicine

## 2018-12-31 ENCOUNTER — Ambulatory Visit (HOSPITAL_COMMUNITY)
Admission: RE | Admit: 2018-12-31 | Discharge: 2018-12-31 | Disposition: A | Discharge status: Home / Self Care | Payer: Medicare Other | Attending: Internal Medicine | Admitting: Internal Medicine

## 2018-12-31 DIAGNOSIS — I10 Essential (primary) hypertension: Secondary | ICD-10-CM | POA: Diagnosis not present

## 2018-12-31 DIAGNOSIS — R933 Abnormal findings on diagnostic imaging of other parts of digestive tract: Secondary | ICD-10-CM

## 2018-12-31 DIAGNOSIS — E1140 Type 2 diabetes mellitus with diabetic neuropathy, unspecified: Secondary | ICD-10-CM | POA: Insufficient documentation

## 2018-12-31 DIAGNOSIS — R1314 Dysphagia, pharyngoesophageal phase: Secondary | ICD-10-CM | POA: Insufficient documentation

## 2018-12-31 DIAGNOSIS — J45909 Unspecified asthma, uncomplicated: Secondary | ICD-10-CM | POA: Insufficient documentation

## 2018-12-31 DIAGNOSIS — Z79899 Other long term (current) drug therapy: Secondary | ICD-10-CM | POA: Insufficient documentation

## 2018-12-31 DIAGNOSIS — K219 Gastro-esophageal reflux disease without esophagitis: Secondary | ICD-10-CM | POA: Diagnosis not present

## 2018-12-31 DIAGNOSIS — Z7982 Long term (current) use of aspirin: Secondary | ICD-10-CM | POA: Insufficient documentation

## 2018-12-31 DIAGNOSIS — Z794 Long term (current) use of insulin: Secondary | ICD-10-CM | POA: Insufficient documentation

## 2018-12-31 DIAGNOSIS — E039 Hypothyroidism, unspecified: Secondary | ICD-10-CM | POA: Insufficient documentation

## 2018-12-31 DIAGNOSIS — F419 Anxiety disorder, unspecified: Secondary | ICD-10-CM | POA: Insufficient documentation

## 2018-12-31 DIAGNOSIS — F319 Bipolar disorder, unspecified: Secondary | ICD-10-CM | POA: Insufficient documentation

## 2018-12-31 DIAGNOSIS — F259 Schizoaffective disorder, unspecified: Secondary | ICD-10-CM | POA: Insufficient documentation

## 2018-12-31 HISTORY — PX: ESOPHAGOGASTRODUODENOSCOPY (EGD) WITH PROPOFOL: SHX5813

## 2018-12-31 HISTORY — PX: ESOPHAGEAL DILATION: SHX303

## 2018-12-31 LAB — GLUCOSE, CAPILLARY
Glucose-Capillary: 128 mg/dL — ABNORMAL HIGH (ref 70–99)
Glucose-Capillary: 102 mg/dL — ABNORMAL HIGH (ref 70–99)

## 2018-12-31 SURGERY — ESOPHAGOGASTRODUODENOSCOPY (EGD) WITH PROPOFOL
Anesthesia: General

## 2018-12-31 MED ORDER — KETAMINE HCL 10 MG/ML IJ SOLN
INTRAMUSCULAR | Status: DC | PRN
  Administered 2018-12-31 (×2): 10 mg via INTRAVENOUS

## 2018-12-31 MED ORDER — CHLORHEXIDINE GLUCONATE CLOTH 2 % EX PADS: 6.0000 | MEDICATED_PAD | Freq: Once | CUTANEOUS | Status: DC

## 2018-12-31 MED ORDER — STERILE WATER FOR IRRIGATION IR SOLN
Status: DC | PRN
  Administered 2018-12-31: 2.5 mL

## 2018-12-31 MED ORDER — LACTATED RINGERS IV SOLN
INTRAVENOUS | Status: DC
  Administered 2018-12-31: 14:00:00 via INTRAVENOUS

## 2018-12-31 MED ORDER — PROPOFOL 500 MG/50ML IV EMUL
INTRAVENOUS | Status: DC | PRN
  Administered 2018-12-31: 150 ug/kg/min via INTRAVENOUS

## 2018-12-31 MED ORDER — PROPOFOL 10 MG/ML IV BOLUS
INTRAVENOUS | Status: DC | PRN
  Administered 2018-12-31 (×2): 20 mg via INTRAVENOUS

## 2018-12-31 NOTE — Transfer of Care (Signed)
Immediate Anesthesia Transfer of Care Note  Patient: Kathryn [name]  Procedure(s) Performed: ESOPHAGOGASTRODUODENOSCOPY (EGD) WITH PROPOFOL (N/A ) ESOPHAGEAL DILATION  Patient Location: PACU  Anesthesia Type:General  Level of Consciousness: drowsy and patient cooperative  Airway & Oxygen Therapy: Patient Spontanous Breathing and Patient connected to nasal cannula oxygen  Post-op Assessment: Report given to RN, Post -op Vital signs reviewed and stable and Patient moving all extremities  Post vital signs: Reviewed and stable  Last Vitals:  Vitals Value Taken Time  BP 179/103 12/31/18 1509  Temp    Pulse 85 12/31/18 1510  Resp 15 12/31/18 1510  SpO2 97 % 12/31/18 1510  Vitals shown include unvalidated device data.  Last Pain:  Vitals:   12/31/18 1337  TempSrc: Oral  PainSc: 0-No pain         Complications: No apparent anesthesia complications

## 2018-12-31 NOTE — Op Note (Signed)
[facility] Patient Name: Kathryn [name] Procedure Date: 12/31/2018 2:36 PM MRN: [id number] Date of Birth: 12/03/1970 Attending MD: Hildred Laser , MD CSN: [id number] Age: 48 Admit Type: Outpatient Procedure:                Upper GI endoscopy Indications:              Esophageal dysphagia, Abnormal esophagram Providers:                Hildred Laser, MD, Lurline Del, RN, Raphael Gibney,                            Technician Referring MD:             Doree Albee, MD Medicines:                Propofol per Anesthesia Complications:            No immediate complications. Estimated Blood Loss:     Estimated blood loss: none. Procedure:                Pre-Anesthesia Assessment:                           - Prior to the procedure, a History and Physical                            was performed, and patient medications and                            allergies were reviewed. The patient's tolerance of                            previous anesthesia was also reviewed. The risks                            and benefits of the procedure and the sedation                            options and risks were discussed with the patient.                            All questions were answered, and informed consent                            was obtained. Prior Anticoagulants: The patient has                            taken no previous anticoagulant or antiplatelet                            agents. ASA Grade Assessment: III - A patient with                            severe systemic disease. After reviewing the risks  and benefits, the patient was deemed in                            satisfactory condition to undergo the procedure.                           After obtaining informed consent, the endoscope was                            passed under direct vision. Throughout the                            procedure, the patient's blood pressure, pulse, and         oxygen saturations were monitored continuously. The                            GIF-H190 ([id number]) scope was introduced through the                            mouth, and advanced to the second part of duodenum.                            The upper GI endoscopy was accomplished without                            difficulty. The patient tolerated the procedure                            well. Scope In: 2:51:41 PM Scope Out: 2:57:42 PM Total Procedure Duration: 0 hours 6 minutes 1 second  Findings:      The hypopharynx was normal.      The examined esophagus was normal.      The Z-line was regular and was found 38 cm from the incisors.      No endoscopic abnormality was evident in the esophagus to explain the       patient's complaint of dysphagia. It was decided, however, to proceed       with dilation of the entire esophagus. The scope was withdrawn. Dilation       was performed with a Maloney dilator with no resistance at 24 Fr. The       dilation site was examined following endoscope reinsertion and showed no       change and no bleeding, mucosal tear or perforation.      The entire examined stomach was normal.      The duodenal bulb and second portion of the duodenum were normal. Impression:               - Normal hypopharynx and laryngeal inlet.                           - Normal esophagus.                           - Z-line regular, 38 cm from the incisors.                           -  No endoscopic esophageal abnormality to explain                            patient's dysphagia. Esophagus dilated. Dilated.                           - Normal stomach.                           - Normal duodenal bulb and second portion of the                            duodenum.                           - No specimens collected. Moderate Sedation:      Per Anesthesia Care Recommendation:           - Patient has a contact number available for                            emergencies. The signs  and symptoms of potential                            delayed complications were discussed with the                            patient. Return to normal activities tomorrow.                            Written discharge instructions were provided to the                            patient.                           - Resume previous diet today.                           - Continue present medications.                           - Telephone GI clinic in 1 week. Procedure Code(s):        --- Professional ---                           [phone], Esophagogastroduodenoscopy, flexible,                            transoral; diagnostic, including collection of                            specimen(s) by brushing or washing, when performed                            (separate procedure)  43450, Dilation of esophagus, by unguided sound or                            bougie, single or multiple passes Diagnosis Code(s):        --- Professional ---                           R13.14, Dysphagia, pharyngoesophageal phase                           R93.3, Abnormal findings on diagnostic imaging of                            other parts of digestive tract CPT copyright 2019 American Medical Association. All rights reserved. The codes documented in this report are preliminary and upon coder review may  be revised to meet current compliance requirements. Hildred Laser, MD Hildred Laser, MD 12/31/2018 3:11:04 PM This report has been signed electronically. Number of Addenda: 0

## 2018-12-31 NOTE — Anesthesia Preprocedure Evaluation (Signed)
Anesthesia Evaluation  Patient identified by MRN, date of birth, ID band Patient awake    Reviewed: Allergy & Precautions, NPO status , Patient's Chart, lab work & pertinent test results, reviewed documented beta blocker date and time   Airway Mallampati: III  TM Distance: >3 FB Neck ROM: Full    Dental  (+) Partial Upper, Partial Lower, Missing   Pulmonary asthma ,    breath sounds clear to auscultation       Cardiovascular Exercise Tolerance: Good 3 - Mets hypertension (last dose of carvedilol - 12/31/18), Pt. on medications and Pt. on home beta blockers Normal cardiovascular exam Rate:Normal     Neuro/Psych PSYCHIATRIC DISORDERS Anxiety Depression Bipolar Disorder Schizophrenia    GI/Hepatic Neg liver ROS, GERD  Medicated and Poorly Controlled,  Endo/Other  diabetes, Poorly Controlled, Type 2Hypothyroidism   Renal/GU Renal disease     Musculoskeletal   Abdominal   Peds  Hematology  (+) anemia ,   Anesthesia Other Findings   Reproductive/Obstetrics                             Anesthesia Physical Anesthesia Plan  ASA: III  Anesthesia Plan: General   Post-op Pain Management:    Induction: Intravenous  PONV Risk Score and Plan:   Airway Management Planned: Nasal Cannula, Simple Face Mask and Natural Airway  Additional Equipment:   Intra-op Plan:   Post-operative Plan:   Informed Consent: I have reviewed the patients History and Physical, chart, labs and discussed the procedure including the risks, benefits and alternatives for the proposed anesthesia with the patient or authorized representative who has indicated his/her understanding and acceptance.     Dental advisory given  Plan Discussed with: CRNA  Anesthesia Plan Comments:         Anesthesia Quick Evaluation

## 2018-12-31 NOTE — H&P (Signed)
Kathryn [name] is an 48 y.o. female.   Chief Complaint: Patient is here for EGD and possible ED HPI: Patient is 48 year old Afro-American female with multiple medical problems including chronic GERD who presented few months history of dysphagia to solids as well as pills.  She underwent barium pill esophagogram about 8 weeks ago.  This study suggested a filling defect in hypopharynx are close to proximal end of the esophagus but no abnormality noted to esophageal mucosa.  Patient was able to swallow barium pill without any difficulty and pill passed into her stomach without any delay.  Patient points to suprasternal area site of bolus obstruction.  She says she has had multiple episodes of food impaction relieved with regurgitation.  He had an episode on Monday night or 4 days ago while she was eating chicken.  She also has difficulty with her pills.  Heartburn is well controlled.  No history of abdominal pain or melena.  Past Medical History:  Diagnosis Date  . Anemia   . Anxiety   . Asthma   . Bipolar disorder ([place])   . Depression   . Diabetes mellitus   . GERD (gastroesophageal reflux disease)   . Hypertension   . Hypothyroidism   . Neuropathy   . Schizoaffective disorder [facility])     Past Surgical History:  Procedure Laterality Date  . BREAST SURGERY Right    biopsy  . CESAREAN SECTION    . CHOLECYSTECTOMY  06/02/2012   Procedure: LAPAROSCOPIC CHOLECYSTECTOMY;  Surgeon: Jamesetta So, MD;  Location: [facility] ORS;  Service: General;  Laterality: N/A;  Attempted laparoscopic cholecystectomy  . CHOLECYSTECTOMY  06/02/2012   Procedure: CHOLECYSTECTOMY;  Surgeon: Jamesetta So, MD;  Location: [facility] ORS;  Service: General;  Laterality: N/A;  converted to open at  0905  . ESOPHAGOGASTRODUODENOSCOPY (EGD) WITH PROPOFOL N/A 08/24/2015   Procedure: ESOPHAGOGASTRODUODENOSCOPY (EGD) WITH PROPOFOL;  Surgeon: Rogene Houston, MD;  Location: [facility] ENDO SUITE;  Service: Endoscopy;  Laterality: N/A;  1:10 - Ann to  notify pt to arrive at 11:30  . tooth removal  2019   all teeth removed  . TUBAL LIGATION     X3    Family History  Problem Relation Age of Onset  . Hypertension Mother   . Alcohol abuse Mother   . Hypertension Father   . Alcohol abuse Sister   . Stroke Other   . Diabetes Other   . Cancer Other   . Seizures Other   . Alcohol abuse Maternal Aunt   . Alcohol abuse Paternal Aunt   . Alcohol abuse Maternal Grandfather   . Alcohol abuse Maternal Grandmother   . Alcohol abuse Cousin    Social History:  reports that she has never smoked. She has never used smokeless tobacco. She reports that she does not drink alcohol or use drugs.  Allergies: No Known Allergies  Medications Prior to Admission  Medication Sig Dispense Refill  . albuterol (PROVENTIL HFA;VENTOLIN HFA) 108 (90 Base) MCG/ACT inhaler Inhale 1-2 puffs into the lungs every 6 (six) hours as needed for wheezing or shortness of breath. 1 Inhaler 0  . aspirin EC 81 MG tablet Take 81 mg by mouth daily.    Marland Kitchen atorvastatin (LIPITOR) 10 MG tablet Take 1 tablet by mouth daily.    . benztropine (COGENTIN) 1 MG tablet Take 1 tablet (1 mg total) by mouth at bedtime. 30 tablet 2  . BYDUREON BCISE 2 MG/0.85ML AUIJ Inject 2 mg into the skin every Monday.     Marland Kitchen  carvedilol (COREG) 12.5 MG tablet Take 12.5 mg by mouth 2 (two) times daily with a meal.     . dicyclomine (BENTYL) 10 MG capsule Take 1 capsule (10 mg total) by mouth 3 (three) times daily before meals. 90 capsule 2  . DULoxetine (CYMBALTA) 60 MG capsule Take 1 capsule (60 mg total) by mouth 2 (two) times daily. 60 capsule 2  . famotidine (PEPCID) 20 MG tablet Take 1 tablet (20 mg total) by mouth 2 (two) times daily. 20 tablet 0  . FLOVENT HFA 44 MCG/ACT inhaler Inhale 2 puffs into the lungs 2 (two) times daily as needed (wheezing or shortness of breath).     . gabapentin (NEURONTIN) 300 MG capsule Take 300 mg by mouth 3 (three) times daily.     . Insulin Glargine, 2 Unit Dial,  (TOUJEO MAX SOLOSTAR) 300 UNIT/ML SOPN Inject 135 Units into the skin daily.     Marland Kitchen JANUVIA 100 MG tablet Take 100 mg by mouth daily.    Marland Kitchen levothyroxine (SYNTHROID, LEVOTHROID) 25 MCG tablet Take 25 mcg by mouth daily before breakfast.     . LINZESS 145 MCG CAPS capsule Take 145 mcg by mouth daily before breakfast.     . losartan (COZAAR) 25 MG tablet Take 25 mg daily by mouth.    . NP THYROID 60 MG tablet Take 60 mg by mouth daily before breakfast.    . omeprazole (PRILOSEC) 20 MG capsule Take 1 capsule (20 mg total) by mouth daily before supper.    . risperiDONE (RISPERDAL) 2 MG tablet Take one in the am and three at bedtime (Patient taking differently: Take 2-6 mg by mouth See admin instructions. Take 2 mg by mouth in the morning and 6 mg at bedtime) 120 tablet 2  . traMADol (ULTRAM) 50 MG tablet Take 1 tablet (50 mg total) by mouth every 6 (six) hours as needed. 10 tablet 0  . traZODone (DESYREL) 100 MG tablet Take 2 tablets (200 mg total) by mouth at bedtime. 60 tablet 2  . amoxicillin (AMOXIL) 500 MG capsule Take 1 capsule (500 mg total) by mouth 3 (three) times daily. (Patient not taking: Reported on 12/29/2018) 21 capsule 0  . ondansetron (ZOFRAN) 4 MG tablet Take 1 tablet (4 mg total) by mouth every 8 (eight) hours as needed. (Patient not taking: Reported on 12/29/2018) 6 tablet 0  . potassium chloride (K-DUR) 10 MEQ tablet Take 1 tablet (10 mEq total) by mouth 2 (two) times daily. (Patient not taking: Reported on 12/29/2018) 10 tablet 0    Results for orders placed or performed during the hospital encounter of 12/31/18 (from the past 48 hour(s))  Glucose, capillary     Status: Abnormal   Collection Time: 12/31/18 12:58 PM  Result Value Ref Range   Glucose-Capillary 128 (H) 70 - 99 mg/dL   No results found.  ROS  Last menstrual period 01/23/2013. Physical Exam  Constitutional: She appears well-developed and well-nourished.  HENT:  Mouth/Throat: Oropharynx is clear and moist.  Eyes:  Conjunctivae are normal. No scleral icterus.  Neck: No thyromegaly present.  Cardiovascular: Normal rate, regular rhythm and normal heart sounds.  No murmur heard. Respiratory: Effort normal and breath sounds normal.  GI:  Abdomen is full.  She has right subcostal scar.  Abdomen is soft and nontender with organomegaly or masses.  Musculoskeletal:        General: No edema.  Neurological: She is alert.  Skin: Skin is warm and dry.  Assessment/Plan Esophageal dysphagia in a patient with chronic GERD. Abnormal esophagogram suggesting filling defect in hypopharynx. EGD with possible ED.  Hildred Laser, MD 12/31/2018, 1:43 PM

## 2018-12-31 NOTE — Anesthesia Postprocedure Evaluation (Signed)
Anesthesia Post Note  Patient: Kathryn [name]  Procedure(s) Performed: ESOPHAGOGASTRODUODENOSCOPY (EGD) WITH PROPOFOL (N/A ) ESOPHAGEAL DILATION  Patient location during evaluation: Phase II Anesthesia Type: General Level of consciousness: awake and alert and patient cooperative Pain management: satisfactory to patient Vital Signs Assessment: post-procedure vital signs reviewed and stable Respiratory status: spontaneous breathing Cardiovascular status: stable Postop Assessment: no apparent nausea or vomiting Anesthetic complications: no     Last Vitals:  Vitals:   12/31/18 1515 12/31/18 1525  BP: (!) 170/90   Pulse: 87 92  Resp: 15 (!) 23  Temp:    SpO2: 97% 98%    Last Pain:  Vitals:   12/31/18 1515  TempSrc:   PainSc: 0-No pain                 Carden Teel

## 2018-12-31 NOTE — Discharge Instructions (Signed)
Upper Endoscopy, Adult, Care After This sheet gives you information about how to care for yourself after your procedure. Your health care provider may also give you more specific instructions. If you have problems or questions, contact your health care provider. What can I expect after the procedure? After the procedure, it is common to have:  A sore throat.  Mild stomach pain or discomfort.  Bloating.  Nausea. Follow these instructions at home:   Follow instructions from your health care provider about what to eat or drink after your procedure.  Return to your normal activities as told by your health care provider. Ask your health care provider what activities are safe for you.  Take over-the-counter and prescription medicines only as told by your health care provider.  Do not drive for 24 hours if you were given a sedative during your procedure.  Keep all follow-up visits as told by your health care provider. This is important. Contact a health care provider if you have:  A sore throat that lasts longer than one day.  Trouble swallowing. Get help right away if:  You vomit blood or your vomit looks like coffee grounds.  You have: ? A fever. ? Bloody, black, or tarry stools. ? A severe sore throat or you cannot swallow. ? Difficulty breathing. ? Severe pain in your chest or abdomen. Summary  After the procedure, it is common to have a sore throat, mild stomach discomfort, bloating, and nausea.  Do not drive for 24 hours if you were given a sedative during the procedure.  Follow instructions from your health care provider about what to eat or drink after your procedure.  Return to your normal activities as told by your health care provider. This information is not intended to replace advice given to you by your health care provider. Make sure you discuss any questions you have with your health care provider. Document Released: 11/11/2011 Document Revised: 11/03/2017  Document Reviewed: 10/12/2017 Elsevier Patient Education  2020 [place] usual medications as before. Resume usual diet. Remember to eat slowly and chew your food well. No driving for 24 hours. Please call office with progress report in 1 week.

## 2019-01-05 ENCOUNTER — Telehealth (INDEPENDENT_AMBULATORY_CARE_PROVIDER_SITE_OTHER): Payer: Self-pay | Admitting: Nurse Practitioner

## 2019-01-05 NOTE — Telephone Encounter (Signed)
Patient called stated she had an EGD done on 8/7 - she is having some cramping and pain - wanted something sent to pharmacy  -  Ph# [phone]

## 2019-01-05 NOTE — Telephone Encounter (Signed)
I called patient and left her a msg to call me tomorrow, I would like to know more specifics about her pain and cramping which is not typical post EGD, await pt's return call

## 2019-01-10 ENCOUNTER — Other Ambulatory Visit: Payer: Self-pay

## 2019-01-10 ENCOUNTER — Ambulatory Visit (HOSPITAL_COMMUNITY): Payer: Medicare Other | Admitting: Psychiatry

## 2019-01-11 ENCOUNTER — Encounter (HOSPITAL_COMMUNITY): Payer: Self-pay | Admitting: Internal Medicine

## 2019-01-18 ENCOUNTER — Ambulatory Visit (INDEPENDENT_AMBULATORY_CARE_PROVIDER_SITE_OTHER): Payer: Medicare Other | Admitting: Psychiatry

## 2019-01-18 ENCOUNTER — Other Ambulatory Visit: Payer: Self-pay

## 2019-01-18 ENCOUNTER — Encounter (HOSPITAL_COMMUNITY): Payer: Self-pay | Admitting: Psychiatry

## 2019-01-18 DIAGNOSIS — F251 Schizoaffective disorder, depressive type: Secondary | ICD-10-CM | POA: Diagnosis not present

## 2019-01-18 DIAGNOSIS — F4310 Post-traumatic stress disorder, unspecified: Secondary | ICD-10-CM

## 2019-01-18 MED ORDER — TRAZODONE HCL 100 MG PO TABS: 200.0000 mg | ORAL_TABLET | Freq: Every day | ORAL | 2 refills | Status: DC

## 2019-01-18 MED ORDER — DULOXETINE HCL 60 MG PO CPEP: 60.0000 mg | ORAL_CAPSULE | Freq: Two times a day (BID) | ORAL | 2 refills | Status: DC

## 2019-01-18 MED ORDER — BENZTROPINE MESYLATE 1 MG PO TABS: 1.0000 mg | ORAL_TABLET | Freq: Every day | ORAL | 2 refills | Status: DC

## 2019-01-18 MED ORDER — RISPERIDONE 2 MG PO TABS: ORAL_TABLET | ORAL | 2 refills | Status: DC

## 2019-01-18 NOTE — Progress Notes (Signed)
[facility] MD/PA/NP OP Progress Note  01/18/2019 11:36 AM Kathryn [name]  MRN:  [id number] Virtual Visit via Telephone Note  I connected with Kathryn [name] on 01/18/19 at 11:20 AM EDT by telephone and verified that I am speaking with the correct person using two identifiers.   I discussed the limitations, risks, security and privacy concerns of performing an evaluation and management service by telephone and the availability of in person appointments. I also discussed with the patient that there may be a patient responsible charge related to this service. The patient expressed understanding and agreed to proceed.      I discussed the assessment and treatment plan with the patient. The patient was provided an opportunity to ask questions and all were answered. The patient agreed with the plan and demonstrated an understanding of the instructions.   The patient was advised to call back or seek an in-person evaluation if the symptoms worsen or if the condition fails to improve as anticipated.  I provided 15 minutes of non-face-to-face time during this encounter.   Levonne Spiller, MD   Chief Complaint:  Chief Complaint    Schizophrenia; Depression     HPI: This patient is a 48 year old separated black female who lives with her daughter in [place]..  She has 2 other daughters and 4 grandchildren.  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  The patient returns after 6 weeks for follow-up regarding her skin so affective disorder.  Last time she told me that her mother had died in 11/08/22 and since then she had not been doing well.  Her auditory hallucinations had increased.  She was having difficulty sleeping off and felt frightened at night.  She was more depressed and sleeping more of the time.  I had increased her Risperdal to 6 mg at bedtime and she states it has helped.  She is only waking up 2 or 3 times a week now and is really  hearing the voices.  Her energy has improved.  She is spending time with her family and her husband.  She states they are still friends even though they are separated.  They are reminiscing about the good times with her mother and she is feeling less stressed about her mother's recent death.  She denies any thoughts of suicidal ideation.  Her blood sugar has been uncontrolled recently but she is working with her PCP to improve this. Visit Diagnosis:    ICD-10-CM   1. Schizoaffective disorder, depressive type ([place])  F25.1   2. PTSD (post-traumatic stress disorder)  F43.10     Past Psychiatric History: 2 previous psychiatric hospitalizations for schizophrenia  Past Medical History:  Past Medical History:  Diagnosis Date  . Anemia   . Anxiety   . Asthma   . Bipolar disorder ([place])   . Depression   . Diabetes mellitus   . GERD (gastroesophageal reflux disease)   . Hypertension   . Hypothyroidism   . Neuropathy   . Schizoaffective disorder [facility])     Past Surgical History:  Procedure Laterality Date  . BREAST SURGERY Right    biopsy  . CESAREAN SECTION    . CHOLECYSTECTOMY  06/02/2012   Procedure: LAPAROSCOPIC CHOLECYSTECTOMY;  Surgeon: Jamesetta So, MD;  Location: [facility];  Service: General;  Laterality: N/A;  Attempted laparoscopic cholecystectomy  . CHOLECYSTECTOMY  06/02/2012   Procedure: CHOLECYSTECTOMY;  Surgeon: Jamesetta So, MD;  Location: [facility];  Service: General;  Laterality: N/A;  converted to open at  0905  . ESOPHAGEAL DILATION  12/31/2018   Procedure: ESOPHAGEAL DILATION;  Surgeon: Rogene Houston, MD;  Location: [facility];  Service: Endoscopy;;  . ESOPHAGOGASTRODUODENOSCOPY (EGD) WITH PROPOFOL N/A 08/24/2015   Procedure: ESOPHAGOGASTRODUODENOSCOPY (EGD) WITH PROPOFOL;  Surgeon: Rogene Houston, MD;  Location: [facility];  Service: Endoscopy;  Laterality: N/A;  1:10 - Ann to notify pt to arrive at 11:30  . ESOPHAGOGASTRODUODENOSCOPY (EGD) WITH PROPOFOL N/A 12/31/2018    Procedure: ESOPHAGOGASTRODUODENOSCOPY (EGD) WITH PROPOFOL;  Surgeon: Rogene Houston, MD;  Location: [facility];  Service: Endoscopy;  Laterality: N/A;  . tooth removal  2019   all teeth removed  . TUBAL LIGATION     X3    Family Psychiatric History: see below  Family History:  Family History  Problem Relation Age of Onset  . Hypertension Mother   . Alcohol abuse Mother   . Hypertension Father   . Alcohol abuse Sister   . Stroke Other   . Diabetes Other   . Cancer Other   . Seizures Other   . Alcohol abuse Maternal Aunt   . Alcohol abuse Paternal Aunt   . Alcohol abuse Maternal Grandfather   . Alcohol abuse Maternal Grandmother   . Alcohol abuse Cousin     Social History:  Social History   Socioeconomic History  . Marital status: Married    Spouse name: Not on file  . Number of children: Not on file  . Years of education: Not on file  . Highest education level: Not on file  Occupational History  . Not on file  Social Needs  . Financial resource strain: Not on file  . Food insecurity    Worry: Not on file    Inability: Not on file  . Transportation needs    Medical: Not on file    Non-medical: Not on file  Tobacco Use  . Smoking status: Never Smoker  . Smokeless tobacco: Never Used  Substance and Sexual Activity  . Alcohol use: No    Alcohol/week: 0.0 standard drinks  . Drug use: No  . Sexual activity: Not Currently    Birth control/protection: Surgical  Lifestyle  . Physical activity    Days per week: Not on file    Minutes per session: Not on file  . Stress: Not on file  Relationships  . Social Herbalist on phone: Not on file    Gets together: Not on file    Attends religious service: Not on file    Active member of club or organization: Not on file    Attends meetings of clubs or organizations: Not on file    Relationship status: Not on file  Other Topics Concern  . Not on file  Social History Narrative  . Not on file     Allergies: No Known Allergies  Metabolic Disorder Labs: Lab Results  Component Value Date   HGBA1C 11.6 (H) 06/02/2012   MPG 286 (H) 06/02/2012   MPG 292 06/01/2009   No results found for: PROLACTIN No results found for: CHOL, TRIG, HDL, CHOLHDL, VLDL, LDLCALC No results found for: TSH  Therapeutic Level Labs: Lab Results  Component Value Date   LITHIUM <0.06 (L) 07/03/2016   LITHIUM 1.17 09/09/2015   No results found for: VALPROATE No components found for:  CBMZ  Current Medications: Current Outpatient Medications  Medication Sig Dispense Refill  . albuterol (PROVENTIL  HFA;VENTOLIN HFA) 108 (90 Base) MCG/ACT inhaler Inhale 1-2 puffs into the lungs every 6 (six) hours as needed for wheezing or shortness of breath. 1 Inhaler 0  . aspirin EC 81 MG tablet Take 81 mg by mouth daily.    Marland Kitchen atorvastatin (LIPITOR) 10 MG tablet Take 1 tablet by mouth daily.    . benztropine (COGENTIN) 1 MG tablet Take 1 tablet (1 mg total) by mouth at bedtime. 30 tablet 2  . BYDUREON BCISE 2 MG/0.85ML AUIJ Inject 2 mg into the skin every Monday.     . carvedilol (COREG) 12.5 MG tablet Take 12.5 mg by mouth 2 (two) times daily with a meal.     . dicyclomine (BENTYL) 10 MG capsule Take 1 capsule (10 mg total) by mouth 3 (three) times daily before meals. 90 capsule 2  . DULoxetine (CYMBALTA) 60 MG capsule Take 1 capsule (60 mg total) by mouth 2 (two) times daily. 60 capsule 2  . famotidine (PEPCID) 20 MG tablet Take 1 tablet (20 mg total) by mouth 2 (two) times daily. 20 tablet 0  . FLOVENT HFA 44 MCG/ACT inhaler Inhale 2 puffs into the lungs 2 (two) times daily as needed (wheezing or shortness of breath).     . gabapentin (NEURONTIN) 300 MG capsule Take 300 mg by mouth 3 (three) times daily.     . Insulin Glargine, 2 Unit Dial, (TOUJEO MAX SOLOSTAR) 300 UNIT/ML SOPN Inject 135 Units into the skin daily.     Marland Kitchen JANUVIA 100 MG tablet Take 100 mg by mouth daily.    Marland Kitchen levothyroxine (SYNTHROID, LEVOTHROID)  25 MCG tablet Take 25 mcg by mouth daily before breakfast.     . LINZESS 145 MCG CAPS capsule Take 145 mcg by mouth daily before breakfast.     . losartan (COZAAR) 25 MG tablet Take 25 mg daily by mouth.    . NP THYROID 60 MG tablet Take 60 mg by mouth daily before breakfast.    . omeprazole (PRILOSEC) 20 MG capsule Take 1 capsule (20 mg total) by mouth daily before supper.    . risperiDONE (RISPERDAL) 2 MG tablet Take one in the am and three at bedtime 120 tablet 2  . traMADol (ULTRAM) 50 MG tablet Take 1 tablet (50 mg total) by mouth every 6 (six) hours as needed. 10 tablet 0  . traZODone (DESYREL) 100 MG tablet Take 2 tablets (200 mg total) by mouth at bedtime. 60 tablet 2   No current facility-administered medications for this visit.      Musculoskeletal: Strength & Muscle Tone: within normal limits Gait & Station: normal Patient leans: N/A  Psychiatric Specialty Exam: Review of Systems  All other systems reviewed and are negative.   Last menstrual period 01/23/2013.There is no height or weight on file to calculate BMI.  General Appearance: NA  Eye Contact:  NA  Speech:  Clear and Coherent  Volume:  Normal  Mood:  Euthymic  Affect:  Appropriate and Congruent  Thought Process:  Goal Directed  Orientation:  Full (Time, Place, and Person)  Thought Content: Rumination occasional hallucinations but they are much less frequent  Suicidal Thoughts:  No  Homicidal Thoughts:  No  Memory:  Immediate;   Good Recent;   Good Remote;   Fair  Judgement:  Fair  Insight:  Fair  Psychomotor Activity:  Decreased  Concentration:  Concentration: Fair and Attention Span: Fair  Recall:  Good  Fund of Knowledge: Fair  Language: Good  Akathisia:  No  Handed:  Right  AIMS (if indicated): not done  Assets:  Communication Skills Desire for Improvement Resilience Social Support  ADL's:  Intact  Cognition: WNL  Sleep:  Fair   Screenings: PHQ2-9     Office Visit from 08/03/2017 in [facility] Telephone from 03/03/2017 in [facility] Telephone from 02/04/2017 in [facility] Telephone from 01/07/2017 in [facility] Out[facility] Telephone from 12/12/2016 in [facility]  PHQ-2 Total Score  0  0  0  1  1       Assessment and Plan: This patient is a 48 year old female this patient is a 48 year old with a history of schizoaffective disorder and posttraumatic stress disorder.  She is doing better since we increased her Risperdal to 2 mg in the morning and 6 mg at bedtime.  She will continue Cogentin 1 mg at bedtime to prevent side effects from Risperdal, continue Cymbalta 60 mg twice daily for depression and trazodone 200 mg at bedtime for sleep.  She will return to see me in 3 months   Levonne Spiller, MD 01/18/2019, 11:36 AM

## 2019-01-28 ENCOUNTER — Other Ambulatory Visit (INDEPENDENT_AMBULATORY_CARE_PROVIDER_SITE_OTHER): Payer: Self-pay | Admitting: Nurse Practitioner

## 2019-01-28 DIAGNOSIS — E119 Type 2 diabetes mellitus without complications: Secondary | ICD-10-CM

## 2019-03-02 ENCOUNTER — Other Ambulatory Visit (INDEPENDENT_AMBULATORY_CARE_PROVIDER_SITE_OTHER): Payer: Self-pay | Admitting: Nurse Practitioner

## 2019-03-11 ENCOUNTER — Other Ambulatory Visit (INDEPENDENT_AMBULATORY_CARE_PROVIDER_SITE_OTHER): Payer: Self-pay | Admitting: Nurse Practitioner

## 2019-03-12 ENCOUNTER — Other Ambulatory Visit (HOSPITAL_COMMUNITY): Payer: Self-pay | Admitting: Psychiatry

## 2019-04-07 ENCOUNTER — Ambulatory Visit (INDEPENDENT_AMBULATORY_CARE_PROVIDER_SITE_OTHER): Payer: Medicare Other | Admitting: Internal Medicine

## 2019-04-07 ENCOUNTER — Encounter (INDEPENDENT_AMBULATORY_CARE_PROVIDER_SITE_OTHER): Payer: Self-pay | Admitting: Internal Medicine

## 2019-04-07 ENCOUNTER — Other Ambulatory Visit: Payer: Self-pay

## 2019-04-07 VITALS — BP 130/88 | HR 95 | Temp 97.0°F | Resp 18 | Ht 62.0 in | Wt 174.4 lb

## 2019-04-07 DIAGNOSIS — Z23 Encounter for immunization: Secondary | ICD-10-CM | POA: Diagnosis not present

## 2019-04-07 DIAGNOSIS — I10 Essential (primary) hypertension: Secondary | ICD-10-CM | POA: Diagnosis not present

## 2019-04-07 DIAGNOSIS — E559 Vitamin D deficiency, unspecified: Secondary | ICD-10-CM

## 2019-04-07 DIAGNOSIS — E785 Hyperlipidemia, unspecified: Secondary | ICD-10-CM | POA: Insufficient documentation

## 2019-04-07 DIAGNOSIS — E782 Mixed hyperlipidemia: Secondary | ICD-10-CM

## 2019-04-07 DIAGNOSIS — E66811 Obesity, class 1: Secondary | ICD-10-CM

## 2019-04-07 DIAGNOSIS — F251 Schizoaffective disorder, depressive type: Secondary | ICD-10-CM

## 2019-04-07 DIAGNOSIS — E1142 Type 2 diabetes mellitus with diabetic polyneuropathy: Secondary | ICD-10-CM | POA: Diagnosis not present

## 2019-04-07 DIAGNOSIS — E039 Hypothyroidism, unspecified: Secondary | ICD-10-CM | POA: Diagnosis not present

## 2019-04-07 DIAGNOSIS — E119 Type 2 diabetes mellitus without complications: Secondary | ICD-10-CM | POA: Diagnosis not present

## 2019-04-07 DIAGNOSIS — E669 Obesity, unspecified: Secondary | ICD-10-CM

## 2019-04-07 DIAGNOSIS — Z794 Long term (current) use of insulin: Secondary | ICD-10-CM

## 2019-04-07 HISTORY — DX: Obesity, class 1: E66.811

## 2019-04-07 HISTORY — DX: Obesity, unspecified: E66.9

## 2019-04-07 HISTORY — DX: Hypothyroidism, unspecified: E03.9

## 2019-04-07 HISTORY — DX: Hyperlipidemia, unspecified: E78.5

## 2019-04-07 NOTE — Progress Notes (Signed)
Metrics: Intervention Frequency ACO  Documented Smoking Status Yearly  Screened one or more times in 24 months  Cessation Counseling or  Active cessation medication Past 24 months  Past 24 months   Guideline developer: UpToDate (See UpToDate for funding source) Date Released: 2014       Wellness Office Visit  Subjective:  Patient ID: Kathryn [name], female    DOB: [birth year]  Age: 48 y.o. MRN: [id number]  CC: This lady comes in for follow-up regarding her diabetes, hypertension, hypothyroidism, obesity and vitamin D deficiency. HPI  She is frustrated that she has not lost as much weight as she thinks but she in fact has lost about 8 pounds since the last time I saw her 3 months ago. She continues on insulin for her diabetes and Januvia.  She does have diabetic neuropathy and would like a diabetic shoes. She continues to take desiccated thyroid for her hypothyroidism and is tolerating this well. She also has underlying schizophrenia which makes difficult management of her chronic medical conditions. She continues to take antihypertensive therapy for her hypertension.  She denies any chest pain, dyspnea, palpitations or limb weakness. She continues on statin therapy for hyperlipidemia in the face of diabetes.  She denies any myalgias. Past Medical History:  Diagnosis Date  . Anemia   . Anxiety   . Asthma   . Bipolar disorder ([place])   . Depression   . Diabetes mellitus   . GERD (gastroesophageal reflux disease)   . HLD (hyperlipidemia) 04/07/2019  . Hypertension   . Hypothyroidism   . Hypothyroidism, adult 04/07/2019  . Neuropathy   . Obesity (BMI 30.0-34.9) 04/07/2019  . Schizoaffective disorder ([place])       Family History  Problem Relation Age of Onset  . Hypertension Mother   . Alcohol abuse Mother   . Hypertension Father   . Alcohol abuse Sister   . Stroke Other   . Diabetes Other   . Cancer Other   . Seizures Other   . Alcohol abuse Maternal Aunt   . Alcohol  abuse Paternal Aunt   . Alcohol abuse Maternal Grandfather   . Alcohol abuse Maternal Grandmother   . Alcohol abuse Cousin     Social History   Social History Narrative   Married for 22 years.On disability secondary to schizophrenia.Lives with husband.   Social History   Tobacco Use  . Smoking status: Never Smoker  . Smokeless tobacco: Never Used  Substance Use Topics  . Alcohol use: No    Alcohol/week: 0.0 standard drinks    Current Meds  Medication Sig  . albuterol (PROVENTIL HFA;VENTOLIN HFA) 108 (90 Base) MCG/ACT inhaler Inhale 1-2 puffs into the lungs every 6 (six) hours as needed for wheezing or shortness of breath.  Marland Kitchen aspirin EC 81 MG tablet Take 81 mg by mouth daily.  Marland Kitchen atorvastatin (LIPITOR) 10 MG tablet Take 1 tablet by mouth daily.  . benztropine (COGENTIN) 1 MG tablet Take 1 tablet (1 mg total) by mouth at bedtime.  Marland Kitchen BYDUREON BCISE 2 MG/0.85ML AUIJ INJECT 2 MG INTO THE SKIN EVERY MONDAY  . carvedilol (COREG) 12.5 MG tablet Take 12.5 mg by mouth 2 (two) times daily with a meal.   . Cholecalciferol (VITAMIN D-3) 125 MCG (5000 UT) TABS Take 2 tablets by mouth daily.  Marland Kitchen dicyclomine (BENTYL) 10 MG capsule Take 1 capsule (10 mg total) by mouth 3 (three) times daily before meals.  . DULoxetine (CYMBALTA) 60 MG capsule TAKE 1 CAPSULE(60 MG) BY  MOUTH TWICE DAILY  . famotidine (PEPCID) 20 MG tablet Take 1 tablet (20 mg total) by mouth 2 (two) times daily.  Marland Kitchen FLOVENT HFA 44 MCG/ACT inhaler Inhale 2 puffs into the lungs 2 (two) times daily as needed (wheezing or shortness of breath).   . gabapentin (NEURONTIN) 300 MG capsule TAKE 1 CAPSULE BY MOUTH THREE TIMES DAILY  . Insulin Glargine, 2 Unit Dial, (TOUJEO MAX SOLOSTAR) 300 UNIT/ML SOPN Inject 135 Units into the skin daily.   Marland Kitchen JANUVIA 100 MG tablet Take 100 mg by mouth daily.  Marland Kitchen LINZESS 145 MCG CAPS capsule Take 145 mcg by mouth daily before breakfast.   . losartan (COZAAR) 25 MG tablet TAKE 1 TABLET BY MOUTH EVERY DAY  . NP  THYROID 60 MG tablet Take 60 mg by mouth daily before breakfast.  . omeprazole (PRILOSEC) 20 MG capsule Take 1 capsule (20 mg total) by mouth daily before supper.  . risperiDONE (RISPERDAL) 2 MG tablet Take one in the am and three at bedtime  . traZODone (DESYREL) 100 MG tablet Take 2 tablets (200 mg total) by mouth at bedtime.  . [DISCONTINUED] traMADol (ULTRAM) 50 MG tablet Take 1 tablet (50 mg total) by mouth every 6 (six) hours as needed.      Objective:   Today's Vitals: BP 130/88 (BP Location: Right Arm, Patient Position: Sitting, Cuff Size: Normal)   Pulse 95   Temp (!) 97 F (36.1 C) (Temporal)   Resp 18   Ht _0  (1.575 m)   Wt 174 lb 6.4 oz (79.1 kg)   LMP 01/23/2013   SpO2 98%   BMI 31.90 kg/m  Vitals with BMI 04/07/2019 12/31/2018 12/31/2018  Height _1  - -  Weight 174 lbs 6 oz - -  BMI 16.10 - -  Systolic 960 - 454  Diastolic 88 - 90  Pulse 95 92 87  Some encounter information is confidential and restricted. Go to Review Flowsheets activity to see all data.     Physical Exam    She looks systemically well, remains obese but has lost 8 pounds since last time I saw her in the office.  Blood pressure is reasonable.  She is alert and orientated without any new focal neurological signs.   Assessment   1. Schizoaffective disorder, depressive type ([place])   2. Essential hypertension   3. Type 2 diabetes mellitus without complication, with long-term current use of insulin ([place])   4. Hypothyroidism, adult   5. Obesity (BMI 30.0-34.9)   6. Mixed hyperlipidemia   7. Vitamin D deficiency disease   8. Diabetic polyneuropathy associated with type 2 diabetes mellitus ([place])       Tests ordered Orders Placed This Encounter  Procedures  . CMP with eGFR([facility])  . Hemoglobin A1c  . Lipid Panel  . Vitamin D, 25-hydroxy  . T3, Free  . TSH  . PR DIABETIC CUSTOM MOLDED SHOE     Plan: 1. Blood work is ordered as above. 2. She will continue with antihypertensive  therapy and her blood pressure is under reasonable control. 3. She will continue with insulin for the time being as well as Januvia but I would like to hopefully discontinue insulin in the future for her diabetes.  We discussed nutrition again and the concept of intermittent fasting.  If she can fast for 16 hours every single day, I think she will get some benefit and improve her diabetic control.  I have given her also a diet sheet regarding nutrition.  4. She will continue with desiccated thyroid and we will check thyroid levels today. 5. She will continue with vitamin D3 supplementation and we will check levels today. 6. She was given influenza vaccination today. 7. I have ordered diabetic shoes for her through the pharmacy. 8. Further recommendations will depend on blood results and I will see her in a few weeks for annual physical exam.   No orders of the defined types were placed in this encounter.   Doree Albee, MD

## 2019-04-07 NOTE — Patient Instructions (Signed)
[name] Dietary Recommendations for Weight Loss What to Avoid . Avoid added sugars o Often added sugar can be found in processed foods such as many condiments, dry cereals, cakes, cookies, chips, crisps, crackers, candies, sweetened drinks, etc.  o Read labels and AVOID/DECREASE use of foods with the following in their ingredient list: Sugar, fructose, high fructose corn syrup, sucrose, glucose, maltose, dextrose, molasses, cane sugar, brown sugar, any type of syrup, agave nectar, etc.   . Avoid snacking in between meals . Avoid foods made with flour o If you are going to eat food made with flour, choose those made with whole-grains; and, minimize your consumption as much as is tolerable . Avoid processed foods o These foods are generally stocked in the middle of the grocery store. Focus on shopping on the perimeter of the grocery.  What to Include . Vegetables o GREEN LEAFY VEGETABLES: Kale, spinach, mustard greens, collard greens, cabbage, broccoli, etc. o OTHER: Asparagus, cauliflower, eggplant, carrots, peas, Brussel sprouts, tomatoes, bell peppers, zucchini, beets, cucumbers, etc. . Grains, seeds, and legumes o Beans: kidney beans, black eyed peas, garbanzo beans, black beans, pinto beans, etc. o Whole, unrefined grains: brown rice, barley, bulgur, oatmeal, etc. . Healthy fats  o Avoid highly processed fats such as vegetable oil o Examples of healthy fats: avocado, olives, virgin olive oil, dark chocolate (?72% Cocoa), nuts (peanuts, almonds, walnuts, cashews, pecans, etc.) . Low - Moderate Intake of Animal Sources of Protein o Meat sources: chicken, turkey, salmon, tuna. Limit to 4 ounces of meat at one time. o Consider limiting dairy sources, but when choosing dairy focus on: PLAIN Greek yogurt, cottage cheese, high-protein milk . Fruit o Choose berries  When to Eat . Intermittent Fasting: o Choosing not to eat for a specific time period, but DO FOCUS ON HYDRATION  when fasting o Multiple Techniques: - Time Restricted Eating: eat 3 meals in a day, each meal lasting no more than 60 minutes, no snacks between meals - 16-18 hour fast: fast for 16 to 18 hours up to 7 days a week. Often suggested to start with 2-3 nonconsecutive days per week.  . Remember the time you sleep is counted as fasting.  . Examples of eating schedule: Fast from 7:00pm-11:00am. Eat between 11:00am-7:00pm.  - 24-hour fast: fast for 24 hours up to every other day. Often suggested to start with 1 day per week . Remember the time you sleep is counted as fasting . Examples of eating schedule:  o Eating day: eat 2-3 meals on your eating day. If doing 2 meals, each meal should last no more than 90 minutes. If doing 3 meals, each meal should last no more than 60 minutes. Finish last meal by 7:00pm. o Fasting day: Fast until 7:00pm.  o IF YOU FEEL UNWELL FOR ANY REASON/IN ANY WAY WHEN FASTING, STOP FASTING BY EATING A NUTRITIOUS SNACK OR LIGHT MEAL o ALWAYS FOCUS ON HYDRATION DURING FASTS - Acceptable Hydration sources: water, broths, tea/coffee (black tea/coffee is best but using a small amount of whole-fat dairy products in coffee/tea is acceptable).  - Poor Hydration Sources: anything with sugar or artificial sweeteners added to it  These recommendations have been developed for patients that are actively receiving medical care from either Dr. Suresh Audi or Sarah Gray, DNP, NP-C at [facility]. These recommendations are developed for patients with specific medical conditions and are not meant to be distributed or used by others that are not actively receiving care from either provider listed   above at [facility]. It is not appropriate to participate in the above eating plans without proper medical supervision.   Reference: Fung, J. The obesity code. [place]: [place]; 2016.   

## 2019-04-08 LAB — HEMOGLOBIN A1C
Hgb A1c MFr Bld: 13.7 % of total Hgb — ABNORMAL HIGH (ref <5.7)
Mean Plasma Glucose: 346 (calc)
eAG (mmol/L): 19.2 (calc)

## 2019-04-08 LAB — COMPLETE METABOLIC PANEL WITH GFR
AG Ratio: 1.4 (calc) (ref 1.0–2.5)
ALT: 17 U/L (ref 6–29)
AST: 11 U/L (ref 10–35)
Albumin: 4 g/dL (ref 3.6–5.1)
Alkaline phosphatase (APISO): 83 U/L (ref 31–125)
BUN/Creatinine Ratio: 15 (calc) (ref 6–22)
BUN: 17 mg/dL (ref 7–25)
CO2: 27 mmol/L (ref 20–32)
Calcium: 9.5 mg/dL (ref 8.6–10.2)
Chloride: 101 mmol/L (ref 98–110)
Creat: 1.14 mg/dL — ABNORMAL HIGH (ref 0.50–1.10)
GFR, Est African American: 66 mL/min/{1.73_m2} (ref >=60)
GFR, Est Non African American: 57 mL/min/{1.73_m2} — ABNORMAL LOW (ref >=60)
Globulin: 2.8 g/dL (calc) (ref 1.9–3.7)
Glucose, Bld: 211 mg/dL — ABNORMAL HIGH (ref 65–99)
Potassium: 3.9 mmol/L (ref 3.5–5.3)
Sodium: 139 mmol/L (ref 135–146)
Total Bilirubin: 0.6 mg/dL (ref 0.2–1.2)
Total Protein: 6.8 g/dL (ref 6.1–8.1)

## 2019-04-08 LAB — LIPID PANEL
Cholesterol: 166 mg/dL (ref <200)
HDL: 44 mg/dL — ABNORMAL LOW (ref >=50)
LDL Cholesterol (Calc): 78 mg/dL (calc)
Non-HDL Cholesterol (Calc): 122 mg/dL (calc) (ref <130)
Total CHOL/HDL Ratio: 3.8 (calc) (ref <5.0)
Triglycerides: 362 mg/dL — ABNORMAL HIGH (ref <150)

## 2019-04-08 LAB — VITAMIN D 25 HYDROXY (VIT D DEFICIENCY, FRACTURES): Vit D, 25-Hydroxy: 41 ng/mL (ref 30–100)

## 2019-04-08 LAB — T3, FREE: T3, Free: 2.9 pg/mL (ref 2.3–4.2)

## 2019-04-08 LAB — TSH: TSH: 1.57 mIU/L

## 2019-04-11 NOTE — Progress Notes (Signed)
Set appt for 12/2 11:40 am

## 2019-04-13 ENCOUNTER — Other Ambulatory Visit (HOSPITAL_COMMUNITY): Payer: Self-pay | Admitting: Psychiatry

## 2019-04-17 ENCOUNTER — Other Ambulatory Visit (INDEPENDENT_AMBULATORY_CARE_PROVIDER_SITE_OTHER): Payer: Self-pay | Admitting: Nurse Practitioner

## 2019-04-19 ENCOUNTER — Ambulatory Visit (HOSPITAL_COMMUNITY): Payer: Medicare Other | Admitting: Psychiatry

## 2019-04-19 ENCOUNTER — Other Ambulatory Visit: Payer: Self-pay

## 2019-04-27 ENCOUNTER — Other Ambulatory Visit: Payer: Self-pay

## 2019-04-27 ENCOUNTER — Encounter (INDEPENDENT_AMBULATORY_CARE_PROVIDER_SITE_OTHER): Payer: Self-pay | Admitting: Internal Medicine

## 2019-04-27 ENCOUNTER — Ambulatory Visit (INDEPENDENT_AMBULATORY_CARE_PROVIDER_SITE_OTHER): Payer: Medicare Other | Admitting: Internal Medicine

## 2019-04-27 VITALS — BP 180/100 | HR 72 | Ht 62.0 in | Wt 172.8 lb

## 2019-04-27 DIAGNOSIS — IMO0002 Reserved for concepts with insufficient information to code with codable children: Secondary | ICD-10-CM

## 2019-04-27 DIAGNOSIS — I10 Essential (primary) hypertension: Secondary | ICD-10-CM

## 2019-04-27 DIAGNOSIS — E1165 Type 2 diabetes mellitus with hyperglycemia: Secondary | ICD-10-CM | POA: Insufficient documentation

## 2019-04-27 DIAGNOSIS — E039 Hypothyroidism, unspecified: Secondary | ICD-10-CM

## 2019-04-27 DIAGNOSIS — Z794 Long term (current) use of insulin: Secondary | ICD-10-CM | POA: Insufficient documentation

## 2019-04-27 HISTORY — DX: Type 2 diabetes mellitus with hyperglycemia: E11.65

## 2019-04-27 HISTORY — DX: Reserved for concepts with insufficient information to code with codable children: IMO0002

## 2019-04-27 MED ORDER — LOSARTAN POTASSIUM 50 MG PO TABS: 50.0000 mg | ORAL_TABLET | Freq: Every day | ORAL | 0 refills | Status: DC

## 2019-04-27 MED ORDER — THYROID 90 MG PO TABS: 90.0000 mg | ORAL_TABLET | Freq: Every day | ORAL | 3 refills | Status: DC

## 2019-04-27 NOTE — Progress Notes (Signed)
Metrics: Intervention Frequency ACO  Documented Smoking Status Yearly  Screened one or more times in 24 months  Cessation Counseling or  Active cessation medication Past 24 months  Past 24 months   Guideline developer: UpToDate (See UpToDate for funding source) Date Released: 2014       Wellness Office Visit  Subjective:  Patient ID: Kathryn [name], female    DOB: 12-30-1970  Age: 48 y.o. MRN: [id number]  CC: This lady comes in for follow-up at my request because of extremely uncontrolled diabetes. HPI  When I had seen her recently, her hemoglobin A1c was 13.7%.  She says that she has been fasting for approximately 14 to 15 hours every day.  Her blood glucose levels in the morning are in the 200 range.  She continues to take the same medications including insulin. I also reviewed with her her blood work including suboptimal T3 levels. Past Medical History:  Diagnosis Date   Anemia    Anxiety    Asthma    Bipolar disorder ([place])    Depression    Diabetes mellitus    GERD (gastroesophageal reflux disease)    HLD (hyperlipidemia) 04/07/2019   Hypertension    Hypothyroidism    Hypothyroidism, adult 04/07/2019   Neuropathy    Obesity (BMI 30.0-34.9) 04/07/2019   Schizoaffective disorder ([place])    Type II diabetes mellitus, uncontrolled ([place]) 04/27/2019      Family History  Problem Relation Age of Onset   Hypertension Mother    Alcohol abuse Mother    Hypertension Father    Alcohol abuse Sister    Stroke Other    Diabetes Other    Cancer Other    Seizures Other    Alcohol abuse Maternal Aunt    Alcohol abuse Paternal Aunt    Alcohol abuse Maternal Grandfather    Alcohol abuse Maternal Grandmother    Alcohol abuse Cousin     Social History   Social History Narrative   Married for 22 years.On disability secondary to schizophrenia.Lives with husband.   Social History   Tobacco Use   Smoking status: Never Smoker   Smokeless tobacco:  Never Used  Substance Use Topics   Alcohol use: No    Alcohol/week: 0.0 standard drinks    Current Meds  Medication Sig   albuterol (PROVENTIL HFA;VENTOLIN HFA) 108 (90 Base) MCG/ACT inhaler Inhale 1-2 puffs into the lungs every 6 (six) hours as needed for wheezing or shortness of breath.   aspirin EC 81 MG tablet Take 81 mg by mouth daily.   atorvastatin (LIPITOR) 10 MG tablet Take 1 tablet by mouth daily.   benztropine (COGENTIN) 1 MG tablet Take 1 tablet (1 mg total) by mouth at bedtime.   carvedilol (COREG) 12.5 MG tablet TAKE 1 TABLET BY MOUTH TWICE DAILY   Cholecalciferol (VITAMIN D-3) 125 MCG (5000 UT) TABS Take 2 tablets by mouth daily.   dicyclomine (BENTYL) 10 MG capsule Take 1 capsule (10 mg total) by mouth 3 (three) times daily before meals.   DULoxetine (CYMBALTA) 60 MG capsule TAKE 1 CAPSULE(60 MG) BY MOUTH TWICE DAILY   famotidine (PEPCID) 20 MG tablet Take 1 tablet (20 mg total) by mouth 2 (two) times daily.   FLOVENT HFA 44 MCG/ACT inhaler Inhale 2 puffs into the lungs 2 (two) times daily as needed (wheezing or shortness of breath).    Insulin Glargine, 2 Unit Dial, (TOUJEO MAX SOLOSTAR) 300 UNIT/ML SOPN Inject 135 Units into the skin daily.    JANUVIA  100 MG tablet Take 100 mg by mouth daily.   LINZESS 145 MCG CAPS capsule Take 145 mcg by mouth daily before breakfast.    losartan (COZAAR) 25 MG tablet TAKE 1 TABLET BY MOUTH EVERY DAY   NP THYROID 60 MG tablet Take 60 mg by mouth daily before breakfast.   omeprazole (PRILOSEC) 20 MG capsule TAKE ONE CAPSULE BY MOUTH EVERY DAY   risperiDONE (RISPERDAL) 2 MG tablet TAKE 1 TABLET BY MOUTH EVERY MORNING AND 3 TABLETS BY MOUTH EVERY NIGHT AT BEDTIME   traZODone (DESYREL) 100 MG tablet Take 2 tablets (200 mg total) by mouth at bedtime.      Objective:   Today's Vitals: BP (!) 180/100    Pulse 72    Ht 5\' 2"  (1.575 m)    Wt 172 lb 12.8 oz (78.4 kg)    LMP 01/23/2013    BMI 31.61 kg/m  Vitals with BMI  04/27/2019 04/07/2019 12/31/2018  Height 5\' 2"  5\' 2"  -  Weight 172 lbs 13 oz 174 lbs 6 oz -  BMI [id number] -  Systolic [id number] -  Diastolic 123XX123 88 -  Pulse 72 95 92  Some encounter information is confidential and restricted. Go to Review Flowsheets activity to see all data.     Physical Exam   She has lost almost 2 pounds since the last visit but her blood pressure is significantly elevated today.  She is alert and orientated without any focal neurological signs.    Assessment   1. Essential hypertension   2. Uncontrolled type 2 diabetes mellitus with hyperglycemia ([place])   3. Hypothyroidism, adult       Tests ordered No orders of the defined types were placed in this encounter.    Plan: 1. I am not happy with the control of her hypertension and I have told her to increase the losartan to 50 mg daily so she will take 2 tablets of losartan 25 mg tablet and I have sent a new prescription for losartan 50 mg tablet, take 1 daily. 2. I am also can increase her NP thyroid dose to 90 mg tablet, take 1 daily and I have sent a new prescription to reflect this. 3. We discussed her uncontrolled diabetes and for the time being I wanted to prolonged intermittent fasting to approximately 17 hours every day and I think she can do this.  She does tend to eat white potatoes every day and I have told her not to do this and replace this with sweet potatoes if she wants to. 4. I will see her at the end of December for annual physical exam and we will see how she is doing then.   Meds ordered this encounter  Medications   thyroid (NP THYROID) 90 MG tablet    Sig: Take 1 tablet (90 mg total) by mouth daily.    Dispense:  30 tablet    Refill:  3   losartan (COZAAR) 50 MG tablet    Sig: Take 1 tablet (50 mg total) by mouth daily.    Dispense:  90 tablet    Refill:  0    Rhandi Despain Luther Parody, MD

## 2019-04-29 ENCOUNTER — Other Ambulatory Visit (INDEPENDENT_AMBULATORY_CARE_PROVIDER_SITE_OTHER): Payer: Self-pay | Admitting: Nurse Practitioner

## 2019-05-25 ENCOUNTER — Encounter (INDEPENDENT_AMBULATORY_CARE_PROVIDER_SITE_OTHER): Payer: Self-pay | Admitting: Internal Medicine

## 2019-05-25 ENCOUNTER — Other Ambulatory Visit: Payer: Self-pay

## 2019-05-25 ENCOUNTER — Ambulatory Visit (INDEPENDENT_AMBULATORY_CARE_PROVIDER_SITE_OTHER): Payer: Medicare Other | Admitting: Internal Medicine

## 2019-05-25 VITALS — BP 140/82 | HR 83 | Resp 98 | Ht 62.0 in | Wt 167.2 lb

## 2019-05-25 DIAGNOSIS — E782 Mixed hyperlipidemia: Secondary | ICD-10-CM | POA: Diagnosis not present

## 2019-05-25 DIAGNOSIS — Z1231 Encounter for screening mammogram for malignant neoplasm of breast: Secondary | ICD-10-CM

## 2019-05-25 DIAGNOSIS — E1165 Type 2 diabetes mellitus with hyperglycemia: Secondary | ICD-10-CM

## 2019-05-25 DIAGNOSIS — E039 Hypothyroidism, unspecified: Secondary | ICD-10-CM | POA: Diagnosis not present

## 2019-05-25 DIAGNOSIS — Z0001 Encounter for general adult medical examination with abnormal findings: Secondary | ICD-10-CM | POA: Diagnosis not present

## 2019-05-25 DIAGNOSIS — Z114 Encounter for screening for human immunodeficiency virus [HIV]: Secondary | ICD-10-CM

## 2019-05-25 DIAGNOSIS — I10 Essential (primary) hypertension: Secondary | ICD-10-CM

## 2019-05-25 NOTE — Progress Notes (Signed)
Chief Complaint: This 48 year old lady comes in for an annual physical exam and to address her chronic conditions which are described below. HPI: She has a history of uncontrolled diabetes and when I saw her in the beginning of this month, we had a long discussion about her diet and she has been following my instructions and to her credit, her blood sugar levels have been improving and she has been losing weight. I also increased her desiccated NP thyroid for hypothyroidism and she has tolerated the higher dose. She also tells me on closer questioning that she has not had a menstrual cycle for about 6 years.  She denies any hot flashes, night sweats although she does describe poor sleep for which she takes trazodone. She continues on insulin for diabetes as well as other medications.  She has seen an eye doctor sometime this year and there was no evidence of diabetic retinopathy.  She does describe paresthesia in both her feet more on the left than the right. She continues with antihypertensive therapy.  I did increase the dose on the last visit and she has tolerated the higher dose.  She denies any chest pain, dyspnea, palpitations or limb weakness. She continues with statin therapy in the face of diabetes.  There are no myalgias. She continues with vitamin D3 supplementation for vitamin D deficiency.  Past Medical History:  Diagnosis Date  . Anemia   . Anxiety   . Asthma   . Depression   . GERD (gastroesophageal reflux disease)   . HLD (hyperlipidemia) 04/07/2019  . Hypertension   . Hypothyroidism, adult 04/07/2019  . Neuropathy   . Obesity (BMI 30.0-34.9) 04/07/2019  . Schizoaffective disorder ([place])   . Type II diabetes mellitus, uncontrolled ([place]) 04/27/2019   Past Surgical History:  Procedure Laterality Date  . BREAST SURGERY Right    biopsy  . CESAREAN SECTION    . CHOLECYSTECTOMY  06/02/2012   Procedure: LAPAROSCOPIC CHOLECYSTECTOMY;  Surgeon: Jamesetta So, MD;  Location: [facility];  Service: General;  Laterality: N/A;  Attempted laparoscopic cholecystectomy  . CHOLECYSTECTOMY  06/02/2012   Procedure: CHOLECYSTECTOMY;  Surgeon: Jamesetta So, MD;  Location: [facility] ORS;  Service: General;  Laterality: N/A;  converted to open at  0905  . ESOPHAGEAL DILATION  12/31/2018   Procedure: ESOPHAGEAL DILATION;  Surgeon: Rogene Houston, MD;  Location: [facility];  Service: Endoscopy;;  . ESOPHAGOGASTRODUODENOSCOPY (EGD) WITH PROPOFOL N/A 08/24/2015   Procedure: ESOPHAGOGASTRODUODENOSCOPY (EGD) WITH PROPOFOL;  Surgeon: Rogene Houston, MD;  Location: [facility];  Service: Endoscopy;  Laterality: N/A;  1:10 - Ann to notify pt to arrive at 11:30  . ESOPHAGOGASTRODUODENOSCOPY (EGD) WITH PROPOFOL N/A 12/31/2018   Procedure: ESOPHAGOGASTRODUODENOSCOPY (EGD) WITH PROPOFOL;  Surgeon: Rogene Houston, MD;  Location: [facility];  Service: Endoscopy;  Laterality: N/A;  . tooth removal  2019   all teeth removed  . TUBAL LIGATION     X3     Social History   Social History Narrative   Married for 22 years.On disability secondary to schizophrenia.Lives with husband.    Social History   Tobacco Use  . Smoking status: Never Smoker  . Smokeless tobacco: Never Used  Substance Use Topics  . Alcohol use: No    Alcohol/week: 0.0 standard drinks      Allergies: No Known Allergies   Current Meds  Medication Sig  . albuterol (PROVENTIL HFA;VENTOLIN HFA) 108 (90 Base) MCG/ACT inhaler Inhale 1-2 puffs into the lungs  every 6 (six) hours as needed for wheezing or shortness of breath.  Marland Kitchen aspirin EC 81 MG tablet Take 81 mg by mouth daily.  Marland Kitchen atorvastatin (LIPITOR) 10 MG tablet Take 1 tablet by mouth daily.  . benztropine (COGENTIN) 1 MG tablet Take 1 tablet (1 mg total) by mouth at bedtime.  Marland Kitchen BYDUREON BCISE 2 MG/0.85ML AUIJ INJECT 2 MG INTO THE SKIN EVERY MONDAY  . carvedilol (COREG) 12.5 MG tablet TAKE 1 TABLET BY MOUTH TWICE DAILY  . Cholecalciferol (VITAMIN D-3) 125 MCG (5000 UT)  TABS Take 2 tablets by mouth daily.  Marland Kitchen dicyclomine (BENTYL) 10 MG capsule Take 1 capsule (10 mg total) by mouth 3 (three) times daily before meals.  . DULoxetine (CYMBALTA) 60 MG capsule TAKE 1 CAPSULE(60 MG) BY MOUTH TWICE DAILY  . famotidine (PEPCID) 20 MG tablet Take 1 tablet (20 mg total) by mouth 2 (two) times daily.  Marland Kitchen FLOVENT HFA 44 MCG/ACT inhaler Inhale 2 puffs into the lungs 2 (two) times daily as needed (wheezing or shortness of breath).   . Insulin Glargine, 2 Unit Dial, (TOUJEO MAX SOLOSTAR) 300 UNIT/ML SOPN Inject 130 Units into the skin daily.   Marland Kitchen JANUVIA 100 MG tablet TAKE 1 TABLET BY MOUTH EVERY DAY  . LINZESS 145 MCG CAPS capsule Take 145 mcg by mouth daily before breakfast.   . losartan (COZAAR) 50 MG tablet Take 1 tablet (50 mg total) by mouth daily.  Marland Kitchen omeprazole (PRILOSEC) 20 MG capsule TAKE ONE CAPSULE BY MOUTH EVERY DAY  . risperiDONE (RISPERDAL) 2 MG tablet TAKE 1 TABLET BY MOUTH EVERY MORNING AND 3 TABLETS BY MOUTH EVERY NIGHT AT BEDTIME  . thyroid (NP THYROID) 90 MG tablet Take 1 tablet (90 mg total) by mouth daily.  . traZODone (DESYREL) 100 MG tablet Take 2 tablets (200 mg total) by mouth at bedtime.     Nutrition She continues with intermittent fasting and is trying to eat healthier overall.  Sleep Poor sleep and she takes trazodone for this.  Exercise None regular.  Bio-identical hormones None.  [id number] from the symptoms mentioned above,there are no other symptoms referable to all systems reviewed.  Physical Exam: Blood pressure 140/82, pulse 83, resp. rate (!) 98, height 5\' 2"  (1.575 m), weight 167 lb 3.2 oz (75.8 kg), last menstrual period 01/23/2013. Vitals with BMI 05/25/2019 04/27/2019 04/07/2019  Height 5\' 2"  5\' 2"  5\' 2"   Weight 167 lbs 3 oz 172 lbs 13 oz 174 lbs 6 oz  BMI 30.57 [id number]  Systolic [id number]  Diastolic 82 123XX123 88  Pulse 83 72 95  Some encounter information is confidential and restricted. Go to Review Flowsheets activity  to see all data.      She looks systemically well but remains obese.  She has lost 5 pounds since the last visit which was less than a month ago. General: Alert, cooperative, and appears to be the stated age.No pallor.  No jaundice.  No clubbing. Head: Normocephalic Eyes: Sclera white, pupils equal and reactive to light, red reflex x 2,  Ears: Normal bilaterally Oral cavity: Lips, mucosa, and tongue normal: Teeth and gums normal Neck: No adenopathy, supple, symmetrical, trachea midline, and thyroid does not appear enlarged. Breast: No masses felt. Respiratory: Clear to auscultation bilaterally.No wheezing, crackles or bronchial breathing. Cardiovascular: Heart sounds are present and appear to be normal without murmurs or added sounds.  No carotid bruits.  Peripheral pulses are present and equal bilaterally.: Gastrointestinal:positive bowel sounds, no hepatosplenomegaly.  No  masses felt.No tenderness. Skin: Clear, No rashes noted.No worrisome skin lesions seen. Neurological: Grossly intact without focal findings, cranial nerves II through XII intact, muscle strength equal bilaterally.  I tested her feet with microfilament and there is evidence of peripheral neuropathy on her left foot. Musculoskeletal: No acute joint abnormalities noted.Full range of movement noted with joints. Psychiatric: Affect appropriate, non-anxious.    Assessment  1. Encounter for screening mammogram for malignant neoplasm of breast   2. Encounter for screening for HIV   3. Uncontrolled type 2 diabetes mellitus with hyperglycemia ([place])   4. Hypothyroidism, adult   5. Mixed hyperlipidemia   6. Essential hypertension   7. Encounter for general adult medical examination with abnormal findings     Tests Ordered:   Orders Placed This Encounter  Procedures  . MM 3D SCREEN BREAST BILATERAL  . HIV antibody (with reflex)     Plan  1. I will send her for mammogram. 2. I encouraged her to continue with  medications for diabetes and she will need to reduce her insulin dose as she continues to improve with weight loss and her blood sugar levels continue to improve hopefully.  I directed her how to do this. 3. She will continue with antihypertensive therapy and her blood pressure is better controlled now. 4. She will continue with current dose of increased NP thyroid 90 mg daily for her hypothyroidism.  She is tolerating this and we will check levels on the next visit. 5. Follow-up in about 6 weeks to 8 weeks.  Then we will do blood work. 6. Today, in addition to a preventative visit, I performed an office visit to address her chronic conditions above     No orders of the defined types were placed in this encounter.    Eulogio Requena C Goerge Mohr   05/25/2019, 9:31 AM

## 2019-06-01 ENCOUNTER — Ambulatory Visit (HOSPITAL_COMMUNITY)
Admission: RE | Admit: 2019-06-01 | Discharge: 2019-06-01 | Disposition: A | Discharge status: Home / Self Care | Payer: Medicare Other | Source: Ambulatory Visit | Attending: Internal Medicine | Admitting: Internal Medicine

## 2019-06-01 ENCOUNTER — Other Ambulatory Visit: Payer: Self-pay

## 2019-06-01 DIAGNOSIS — Z1231 Encounter for screening mammogram for malignant neoplasm of breast: Secondary | ICD-10-CM | POA: Insufficient documentation

## 2019-06-02 ENCOUNTER — Other Ambulatory Visit (INDEPENDENT_AMBULATORY_CARE_PROVIDER_SITE_OTHER): Payer: Self-pay | Admitting: Internal Medicine

## 2019-06-02 ENCOUNTER — Ambulatory Visit (HOSPITAL_COMMUNITY): Payer: Medicare Other | Admitting: Psychiatry

## 2019-06-03 ENCOUNTER — Other Ambulatory Visit: Payer: Self-pay

## 2019-06-03 ENCOUNTER — Ambulatory Visit (INDEPENDENT_AMBULATORY_CARE_PROVIDER_SITE_OTHER): Payer: Medicare Other | Admitting: Psychiatry

## 2019-06-03 ENCOUNTER — Encounter (HOSPITAL_COMMUNITY): Payer: Self-pay | Admitting: Psychiatry

## 2019-06-03 DIAGNOSIS — F251 Schizoaffective disorder, depressive type: Secondary | ICD-10-CM | POA: Diagnosis not present

## 2019-06-03 DIAGNOSIS — F4310 Post-traumatic stress disorder, unspecified: Secondary | ICD-10-CM | POA: Diagnosis not present

## 2019-06-03 MED ORDER — RISPERIDONE 2 MG PO TABS: ORAL_TABLET | ORAL | 2 refills | Status: DC

## 2019-06-03 MED ORDER — BENZTROPINE MESYLATE 1 MG PO TABS: 1.0000 mg | ORAL_TABLET | Freq: Every day | ORAL | 2 refills | Status: DC

## 2019-06-03 MED ORDER — TRAZODONE HCL 100 MG PO TABS: 200.0000 mg | ORAL_TABLET | Freq: Every day | ORAL | 2 refills | Status: DC

## 2019-06-03 MED ORDER — DULOXETINE HCL 60 MG PO CPEP: ORAL_CAPSULE | ORAL | 2 refills | Status: DC

## 2019-06-03 NOTE — Progress Notes (Signed)
Virtual Visit via Video Note  I connected with Kathryn [name] on 06/03/19 at  8:40 AM EST by a video enabled telemedicine application and verified that I am speaking with the correct person using two identifiers.   I discussed the limitations of evaluation and management by telemedicine and the availability of in person appointments. The patient expressed understanding and agreed to proceed   I discussed the assessment and treatment plan with the patient. The patient was provided an opportunity to ask questions and all were answered. The patient agreed with the plan and demonstrated an understanding of the instructions.   The patient was advised to call back or seek an in-person evaluation if the symptoms worsen or if the condition fails to improve as anticipated.  I provided 15 minutes of non-face-to-face time during this encounter.   Levonne Spiller, MD  [facility] MD/PA/NP OP Progress Note  06/03/2019 8:58 AM Kathryn [name]  MRN:  [id number]  Chief Complaint:  Chief Complaint    Depression; Anxiety; Hallucinations; Schizophrenia     HPI: This patient is a 49 year old separated black female who lives with her daughter in [place]..  She has 2 other daughters and 4 grandchildren.  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  The patient returns for follow-up after 5 months.  She states that for the most part she is doing okay.  She did have some crying spells over the holidays because she misses her mother who died last year.  She still has some auditory hallucinations at night but nothing telling her to hurt her self.  She awakens sometimes during the night but is able to get back to sleep.  She is already on 8 mg of Risperdal and I explained to her that I cannot increase this any further.  She thinks she is doing okay the way she is.  Her A1c is quite high at 13 and she is working with her primary care physician to try to get her blood  sugar down.  She is only eating during certain times of day trying to exercise and she has lost about 13 pounds.  She states overall her mood has been stable and she denies any thoughts of self-harm or suicide.  She is spending a lot of time with family during the day.   Visit Diagnosis:    ICD-10-CM   1. Schizoaffective disorder, depressive type ([place])  F25.1   2. PTSD (post-traumatic stress disorder)  F43.10     Past Psychiatric History: 2 previous psychiatric hospitalizations for schizophrenia  Past Medical History:  Past Medical History:  Diagnosis Date  . Anemia   . Anxiety   . Asthma   . Depression   . GERD (gastroesophageal reflux disease)   . HLD (hyperlipidemia) 04/07/2019  . Hypertension   . Hypothyroidism, adult 04/07/2019  . Neuropathy   . Obesity (BMI 30.0-34.9) 04/07/2019  . Schizoaffective disorder ([place])   . Type II diabetes mellitus, uncontrolled ([place]) 04/27/2019    Past Surgical History:  Procedure Laterality Date  . BREAST SURGERY Right    biopsy  . CESAREAN SECTION    . CHOLECYSTECTOMY  06/02/2012   Procedure: LAPAROSCOPIC CHOLECYSTECTOMY;  Surgeon: Jamesetta So, MD;  Location: [facility];  Service: General;  Laterality: N/A;  Attempted laparoscopic cholecystectomy  . CHOLECYSTECTOMY  06/02/2012   Procedure: CHOLECYSTECTOMY;  Surgeon: Jamesetta So, MD;  Location: [facility];  Service: General;  Laterality: N/A;  converted  to open at  0905  . ESOPHAGEAL DILATION  12/31/2018   Procedure: ESOPHAGEAL DILATION;  Surgeon: Rogene Houston, MD;  Location: [facility];  Service: Endoscopy;;  . ESOPHAGOGASTRODUODENOSCOPY (EGD) WITH PROPOFOL N/A 08/24/2015   Procedure: ESOPHAGOGASTRODUODENOSCOPY (EGD) WITH PROPOFOL;  Surgeon: Rogene Houston, MD;  Location: [facility];  Service: Endoscopy;  Laterality: N/A;  1:10 - Ann to notify pt to arrive at 11:30  . ESOPHAGOGASTRODUODENOSCOPY (EGD) WITH PROPOFOL N/A 12/31/2018   Procedure: ESOPHAGOGASTRODUODENOSCOPY (EGD) WITH PROPOFOL;   Surgeon: Rogene Houston, MD;  Location: [facility];  Service: Endoscopy;  Laterality: N/A;  . tooth removal  2019   all teeth removed  . TUBAL LIGATION     X3    Family Psychiatric History: see below  Family History:  Family History  Problem Relation Age of Onset  . Hypertension Mother   . Alcohol abuse Mother   . Heart disease Mother   . Kidney disease Mother   . Hypertension Father   . Alcohol abuse Sister   . Stroke Other   . Diabetes Other   . Cancer Other   . Seizures Other   . Alcohol abuse Maternal Aunt   . Alcohol abuse Paternal Aunt   . Alcohol abuse Maternal Grandfather   . Alcohol abuse Maternal Grandmother   . Alcohol abuse Cousin   . Hearing loss Daughter     Social History:  Social History   Socioeconomic History  . Marital status: Married    Spouse name: Not on file  . Number of children: Not on file  . Years of education: Not on file  . Highest education level: Not on file  Occupational History  . Not on file  Tobacco Use  . Smoking status: Never Smoker  . Smokeless tobacco: Never Used  Substance and Sexual Activity  . Alcohol use: No    Alcohol/week: 0.0 standard drinks  . Drug use: No  . Sexual activity: Not Currently    Birth control/protection: Surgical  Other Topics Concern  . Not on file  Social History Narrative   Married for 22 years.On disability secondary to schizophrenia.Lives with husband.   Social Determinants of Health   Financial Resource Strain:   . Difficulty of Paying Living Expenses: Not on file  Food Insecurity:   . Worried About Charity fundraiser in the Last Year: Not on file  . Ran Out of Food in the Last Year: Not on file  Transportation Needs:   . Lack of Transportation (Medical): Not on file  . Lack of Transportation (Non-Medical): Not on file  Physical Activity:   . Days of Exercise per Week: Not on file  . Minutes of Exercise per Session: Not on file  Stress:   . Feeling of Stress : Not on file   Social Connections:   . Frequency of Communication with Friends and Family: Not on file  . Frequency of Social Gatherings with Friends and Family: Not on file  . Attends Religious Services: Not on file  . Active Member of Clubs or Organizations: Not on file  . Attends Archivist Meetings: Not on file  . Marital Status: Not on file    Allergies: No Known Allergies  Metabolic Disorder Labs: Lab Results  Component Value Date   HGBA1C 13.7 (H) 04/07/2019   MPG 346 04/07/2019   MPG 286 (H) 06/02/2012   No results found for: PROLACTIN Lab Results  Component Value Date   CHOL 166  04/07/2019   TRIG 362 (H) 04/07/2019   HDL 44 (L) 04/07/2019   CHOLHDL 3.8 04/07/2019   LDLCALC 78 04/07/2019   Lab Results  Component Value Date   TSH 1.57 04/07/2019    Therapeutic Level Labs: Lab Results  Component Value Date   LITHIUM <0.06 (L) 07/03/2016   LITHIUM 1.17 09/09/2015   No results found for: VALPROATE No components found for:  CBMZ  Current Medications: Current Outpatient Medications  Medication Sig Dispense Refill  . albuterol (PROVENTIL HFA;VENTOLIN HFA) 108 (90 Base) MCG/ACT inhaler Inhale 1-2 puffs into the lungs every 6 (six) hours as needed for wheezing or shortness of breath. 1 Inhaler 0  . aspirin EC 81 MG tablet Take 81 mg by mouth daily.    Marland Kitchen atorvastatin (LIPITOR) 10 MG tablet Take 1 tablet by mouth daily.    . benztropine (COGENTIN) 1 MG tablet Take 1 tablet (1 mg total) by mouth at bedtime. 30 tablet 2  . BYDUREON BCISE 2 MG/0.85ML AUIJ INJECT 2 MG INTO THE SKIN EVERY MONDAY 6.8 mL 3  . carvedilol (COREG) 12.5 MG tablet TAKE 1 TABLET BY MOUTH TWICE DAILY 180 tablet 0  . Cholecalciferol (VITAMIN D-3) 125 MCG (5000 UT) TABS Take 2 tablets by mouth daily.    Marland Kitchen dicyclomine (BENTYL) 10 MG capsule Take 1 capsule (10 mg total) by mouth 3 (three) times daily before meals. 90 capsule 2  . DULoxetine (CYMBALTA) 60 MG capsule TAKE 1 CAPSULE(60 MG) BY MOUTH TWICE  DAILY 60 capsule 2  . famotidine (PEPCID) 20 MG tablet Take 1 tablet (20 mg total) by mouth 2 (two) times daily. 20 tablet 0  . FLOVENT HFA 44 MCG/ACT inhaler Inhale 2 puffs into the lungs 2 (two) times daily as needed (wheezing or shortness of breath).     . gabapentin (NEURONTIN) 300 MG capsule TAKE 1 CAPSULE BY MOUTH THREE TIMES DAILY 90 capsule 3  . Insulin Glargine, 2 Unit Dial, (TOUJEO MAX SOLOSTAR) 300 UNIT/ML SOPN Inject 130 Units into the skin daily.     Marland Kitchen JANUVIA 100 MG tablet TAKE 1 TABLET BY MOUTH EVERY DAY 90 tablet 0  . LINZESS 145 MCG CAPS capsule Take 145 mcg by mouth daily before breakfast.     . losartan (COZAAR) 50 MG tablet Take 1 tablet (50 mg total) by mouth daily. 90 tablet 0  . omeprazole (PRILOSEC) 20 MG capsule TAKE ONE CAPSULE BY MOUTH EVERY DAY 90 capsule 0  . risperiDONE (RISPERDAL) 2 MG tablet TAKE 1 TABLET BY MOUTH EVERY MORNING AND 3 TABLETS BY MOUTH EVERY NIGHT AT BEDTIME 120 tablet 2  . thyroid (NP THYROID) 90 MG tablet Take 1 tablet (90 mg total) by mouth daily. 30 tablet 3  . traZODone (DESYREL) 100 MG tablet Take 2 tablets (200 mg total) by mouth at bedtime. 60 tablet 2   No current facility-administered medications for this visit.     Musculoskeletal: Strength & Muscle Tone: within normal limits Gait & Station: normal Patient leans: N/A  Psychiatric Specialty Exam: Review of Systems  Psychiatric/Behavioral: Positive for hallucinations. The patient is nervous/anxious.   All other systems reviewed and are negative.   Last menstrual period 01/23/2013.There is no height or weight on file to calculate BMI.  General Appearance: Casual and Fairly Groomed  Eye Contact:  Good  Speech:  Clear and Coherent  Volume:  Normal  Mood:  Anxious  Affect:  Blunt  Thought Process:  Goal Directed  Orientation:  Full (Time, Place, and Person)  Thought Content: Hallucinations: Auditory Visual and Rumination   Suicidal Thoughts:  No  Homicidal Thoughts:  No   Memory:  Immediate;   Good Recent;   Good Remote;   Fair  Judgement:  Good  Insight:  Fair  Psychomotor Activity:  Normal  Concentration:  Concentration: Good and Attention Span: Good  Recall:  [employer] of Knowledge: Fair  Language: Good  Akathisia:  No  Handed:  Right  AIMS (if indicated): not done  Assets:  Communication Skills Desire for Improvement Resilience Social Support Talents/Skills  ADL's:  Intact  Cognition: WNL  Sleep:  Fair   Screenings: PHQ2-9     Office Visit from 08/03/2017 in [facility] Telephone from 03/03/2017 in [facility] Telephone from 02/04/2017 in [facility] Telephone from 01/07/2017 in [facility] Telephone from 12/12/2016 in [facility]  PHQ-2 Total Score  0  0  0  1  1       Assessment and Plan: This patient is a 49 year old female with a history of schizoaffective disorder and posttraumatic stress disorder.  She seems to be stable at this time.  She still has a few residual hallucinations but nothing that is really disturbing her.  Therefore she will continue respite all to 2 mg in the morning and 6 mg at bedtime for schizophrenia, Cogentin 1 mg at bedtime to prevent side effects from Risperdal, Cymbalta 60 mg twice daily for depression and trazodone 200 mg at bedtime for sleep.  She will return to see me in 3 months   Levonne Spiller, MD 06/03/2019, 8:58 AM

## 2019-06-07 ENCOUNTER — Telehealth: Payer: Self-pay | Admitting: Cardiology

## 2019-06-07 NOTE — Telephone Encounter (Signed)

## 2019-06-08 ENCOUNTER — Telehealth (INDEPENDENT_AMBULATORY_CARE_PROVIDER_SITE_OTHER): Payer: Medicare Other | Admitting: Student

## 2019-06-08 ENCOUNTER — Encounter: Payer: Self-pay | Admitting: Student

## 2019-06-08 VITALS — Ht 62.0 in | Wt 163.0 lb

## 2019-06-08 DIAGNOSIS — E1165 Type 2 diabetes mellitus with hyperglycemia: Secondary | ICD-10-CM

## 2019-06-08 DIAGNOSIS — I10 Essential (primary) hypertension: Secondary | ICD-10-CM

## 2019-06-08 DIAGNOSIS — Z87898 Personal history of other specified conditions: Secondary | ICD-10-CM

## 2019-06-08 DIAGNOSIS — E782 Mixed hyperlipidemia: Secondary | ICD-10-CM | POA: Diagnosis not present

## 2019-06-08 NOTE — Patient Instructions (Signed)
Medication Instructions:  Your physician recommends that you continue on your current medications as directed. Please refer to the Current Medication list given to you today.  *If you need a refill on your cardiac medications before your next appointment, please call your pharmacy*  Lab Work: NONE  If you have labs (blood work) drawn today and your tests are completely normal, you will receive your results only by: Marland Kitchen MyChart Message (if you have MyChart) OR . A paper copy in the mail If you have any lab test that is abnormal or we need to change your treatment, we will call you to review the results.  Testing/Procedures: NONE   Follow-Up: At [facility], you and your health needs are our priority.  As part of our continuing mission to provide you with exceptional heart care, we have created designated Provider Care Teams.  These Care Teams include your primary Cardiologist (physician) and Advanced Practice Providers (APPs -  Physician Assistants and Nurse Practitioners) who all work together to provide you with the care you need, when you need it.  Your next appointment:   1 year(s)  The format for your next appointment:   In Person  Provider:   Carlyle Dolly, MD  Other Instructions Thank you for choosing [facility]!

## 2019-06-08 NOTE — Progress Notes (Signed)
Virtual Visit via Telephone Note   This visit type was conducted due to national recommendations for restrictions regarding the COVID-19 Pandemic (e.g. social distancing) in an effort to limit this patient's exposure and mitigate transmission in our community.  Due to her co-morbid illnesses, this patient is at least at moderate risk for complications without adequate follow up.  This format is felt to be most appropriate for this patient at this time.  The patient did not have access to video technology/had technical difficulties with video requiring transitioning to audio format only (telephone).  All issues noted in this document were discussed and addressed.  No physical exam could be performed with this format.  Please refer to the patient's chart for her  consent to telehealth for [facility].   Date:  06/08/2019   ID:  Kathryn [name], DOB [birth year], MRN [id number]  Patient Location: Home Provider Location: Office  PCP:  Doree Albee, MD  Cardiologist:  Carlyle Dolly, MD  Electrophysiologist:  None   Evaluation Performed:  Follow-Up Visit  Chief Complaint:  Annual Follow-up  History of Present Illness:    Kathryn [name] is a 49 y.o. female with past medical history of HTN, HLD, uncontrolled IDDM, hypothyroidism and prior chest pain who presents for a 1-year telehealth follow-up visit.   She was last examined by Dr. Harl Bowie in 09/2017 following recent echocardiogram and stress testing which were overall reassuring. She was still having episodes of sharp pain which was worse with movement or deep breathing. Symptoms were overall felt to be atypical and given her recent stress test, further testing was not pursued and risk factor modification was recommended.   In talking with the patient today, she denies any recent chest pain or dyspnea on exertion. 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  She denies any specific orthopnea, PND, lower extremity edema or palpitations.  She does not have a BP cuff at home but this was at 140/82 when checked by her PCP a few weeks ago.  The patient does not have symptoms concerning for COVID-19 infection (fever, chills, cough, or new shortness of breath).    Past Medical History:  Diagnosis Date  . Anemia   . Anxiety   . Asthma   . Depression   . GERD (gastroesophageal reflux disease)   . HLD (hyperlipidemia) 04/07/2019  . Hypertension   . Hypothyroidism, adult 04/07/2019  . Neuropathy   . Obesity (BMI 30.0-34.9) 04/07/2019  . Schizoaffective disorder ([place])   . Type II diabetes mellitus, uncontrolled ([place]) 04/27/2019   Past Surgical History:  Procedure Laterality Date  . BREAST SURGERY Right    biopsy  . CESAREAN SECTION    . CHOLECYSTECTOMY  06/02/2012   Procedure: LAPAROSCOPIC CHOLECYSTECTOMY;  Surgeon: Jamesetta So, MD;  Location: [facility];  Service: General;  Laterality: N/A;  Attempted laparoscopic cholecystectomy  . CHOLECYSTECTOMY  06/02/2012   Procedure: CHOLECYSTECTOMY;  Surgeon: Jamesetta So, MD;  Location: [facility];  Service: General;  Laterality: N/A;  converted to open at  0905  . ESOPHAGEAL DILATION  12/31/2018   Procedure: ESOPHAGEAL DILATION;  Surgeon: Rogene Houston, MD;  [facility];  Service: Endoscopy;;  . ESOPHAGOGASTRODUODENOSCOPY (EGD) WITH PROPOFOL N/A 08/24/2015   Procedure: ESOPHAGOGASTRODUODENOSCOPY (EGD) WITH PROPOFOL;  Surgeon: Rogene Houston, MD;  Location: [facility];  Service: Endoscopy;  Laterality:  N/A;  1:10 - Ann to notify pt to arrive at 11:30  . ESOPHAGOGASTRODUODENOSCOPY (EGD) WITH PROPOFOL N/A 12/31/2018   Procedure: ESOPHAGOGASTRODUODENOSCOPY (EGD) WITH PROPOFOL;  Surgeon: Rogene Houston, MD;  [facility];  Service: Endoscopy;  Laterality: N/A;  . tooth removal  2019   all teeth removed  . TUBAL LIGATION     X3     Current Meds  Medication Sig   . albuterol (PROVENTIL HFA;VENTOLIN HFA) 108 (90 Base) MCG/ACT inhaler Inhale 1-2 puffs into the lungs every 6 (six) hours as needed for wheezing or shortness of breath.  Marland Kitchen aspirin EC 81 MG tablet Take 81 mg by mouth daily.  Marland Kitchen atorvastatin (LIPITOR) 10 MG tablet Take 1 tablet by mouth daily.  . benztropine (COGENTIN) 1 MG tablet Take 1 tablet (1 mg total) by mouth at bedtime.  Marland Kitchen BYDUREON BCISE 2 MG/0.85ML AUIJ INJECT 2 MG INTO THE SKIN EVERY MONDAY  . carvedilol (COREG) 12.5 MG tablet TAKE 1 TABLET BY MOUTH TWICE DAILY  . Cholecalciferol (VITAMIN D-3) 125 MCG (5000 UT) TABS Take 2 tablets by mouth daily.  Marland Kitchen dicyclomine (BENTYL) 10 MG capsule Take 1 capsule (10 mg total) by mouth 3 (three) times daily before meals.  . DULoxetine (CYMBALTA) 60 MG capsule TAKE 1 CAPSULE(60 MG) BY MOUTH TWICE DAILY  . famotidine (PEPCID) 20 MG tablet Take 1 tablet (20 mg total) by mouth 2 (two) times daily.  Marland Kitchen FLOVENT HFA 44 MCG/ACT inhaler Inhale 2 puffs into the lungs 2 (two) times daily as needed (wheezing or shortness of breath).   . gabapentin (NEURONTIN) 300 MG capsule TAKE 1 CAPSULE BY MOUTH THREE TIMES DAILY  . Insulin Glargine, 2 Unit Dial, (TOUJEO MAX SOLOSTAR) 300 UNIT/ML SOPN Inject 130 Units into the skin daily.   Marland Kitchen JANUVIA 100 MG tablet TAKE 1 TABLET BY MOUTH EVERY DAY  . LINZESS 145 MCG CAPS capsule Take 145 mcg by mouth daily before breakfast.   . losartan (COZAAR) 50 MG tablet Take 1 tablet (50 mg total) by mouth daily.  Marland Kitchen omeprazole (PRILOSEC) 20 MG capsule TAKE ONE CAPSULE BY MOUTH EVERY DAY  . risperiDONE (RISPERDAL) 2 MG tablet TAKE 1 TABLET BY MOUTH EVERY MORNING AND 3 TABLETS BY MOUTH EVERY NIGHT AT BEDTIME  . thyroid (NP THYROID) 90 MG tablet Take 1 tablet (90 mg total) by mouth daily.  . traZODone (DESYREL) 100 MG tablet Take 2 tablets (200 mg total) by mouth at bedtime.     Allergies:   Patient has no known allergies.   Social History   Tobacco Use  . Smoking status: Never Smoker    . Smokeless tobacco: Never Used  Substance Use Topics  . Alcohol use: No    Alcohol/week: 0.0 standard drinks  . Drug use: No     Family Hx: The patient's family history includes Alcohol abuse in her cousin, maternal aunt, maternal grandfather, maternal grandmother, mother, paternal aunt, and sister; Cancer in an other family member; Diabetes in an other family member; Hearing loss in her daughter; Heart disease in her mother; Hypertension in her father and mother; Kidney disease in her mother; Seizures in an other family member; Stroke in an other family member.  ROS:   Please see the history of present illness.     All other systems reviewed and are negative.   Prior CV studies:   The following studies were reviewed today:  Echocardiogram: 01/2017 Study Conclusions  - Left ventricle: The cavity size was normal. Wall  thickness was   increased in a pattern of mild LVH. Systolic function was normal.   The estimated ejection fraction was in the range of 60% to 65%.   Wall motion was normal; there were no regional wall motion   abnormalities. - Right atrium: Central venous pressure (est): 3 mm Hg. - Atrial septum: No defect or patent foramen ovale was identified. - Tricuspid valve: There was trivial regurgitation. - Pulmonary arteries: Systolic pressure could not be accurately   estimated. - Pericardium, extracardiac: A small pericardial effusion was   identified circumferential to the heart.  Impressions:  - Mild LVH with LVEF 60-65%. Indeterminate diastolic function.   Trivial tricuspid regurgitation. Small circumferential   pericardial effusion.  NST: 03/2017  Sinus rhythm with nonspecific T wave abnormalities seen throughout study.  The study is normal with normal myocardial perfusion.  This is a low risk study.  Nuclear stress EF: 62%.  Labs/Other Tests and Data Reviewed:    EKG:  No ECG reviewed.  Recent Labs: 04/07/2019: ALT 17; BUN 17; Creat 1.14;  Potassium 3.9; Sodium 139; TSH 1.57   Recent Lipid Panel Lab Results  Component Value Date/Time   CHOL 166 04/07/2019 02:21 PM   TRIG 362 (H) 04/07/2019 02:21 PM   HDL 44 (L) 04/07/2019 02:21 PM   CHOLHDL 3.8 04/07/2019 02:21 PM   LDLCALC 78 04/07/2019 02:21 PM    Wt Readings from Last 3 Encounters:  06/08/19 163 lb (73.9 kg)  05/25/19 167 lb 3.2 oz (75.8 kg)  04/27/19 172 lb 12.8 oz (78.4 kg)     Objective:    Vital Signs:  Ht 5\' 2"  (1.575 m)   Wt 163 lb (73.9 kg)   LMP 01/23/2013   BMI 29.81 kg/m    General: Pleasant female sounding in NAD. Psych: Normal affect. Was taking a nap earlier and had just woken up at the time of this encounter. Neuro: Alert and oriented X 3.  Lungs:  Resp regular and unlabored while talking on the phone.    ASSESSMENT & PLAN:    1. History of Chest Pain - She denies any recent chest pain or dyspnea on exertion. 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 - NST in 2018 showed normal perfusion and was overall a low risk study. Given her prior reassuring work-up and no recurrent symptoms, would not anticipate further testing at this time.  2. HTN - BP was slightly elevated at 140/82 when checked by her PCP several weeks ago. She does not have a cuff at home but reports having access to one. I did encourage her to check her readings in the ambulatory setting if able. Continue current medication regimen for now with Coreg 12.5 mg twice daily and Losartan 50 mg daily  3. HLD - FLP in 03/2019 showed total cholesterol 166, HDL 44, triglycerides 362 and LDL 78. - She remains on Atorvastatin 10 mg daily.  4. IDDM - Hgb A1c elevated to 13.7 when checked in 03/2019. Followed by PCP.   COVID-19 Education: The signs and symptoms of COVID-19 were discussed with the patient and how to seek care for testing (follow up with PCP or arrange E-visit).  The importance of social  distancing was discussed today.  Time:   Today, I have spent 13 minutes with the patient with telehealth technology discussing the above problems.     Medication Adjustments/Labs and Tests Ordered: Current medicines  are reviewed at length with the patient today.  Concerns regarding medicines are outlined above.   Tests Ordered: No orders of the defined types were placed in this encounter.   Medication Changes: No orders of the defined types were placed in this encounter.   Follow Up:  In Person in 1 year(s)  Signed, Erma Heritage, PA-C  06/08/2019 3:05 PM    [place]

## 2019-07-08 ENCOUNTER — Other Ambulatory Visit (INDEPENDENT_AMBULATORY_CARE_PROVIDER_SITE_OTHER): Payer: Self-pay | Admitting: Internal Medicine

## 2019-07-10 ENCOUNTER — Other Ambulatory Visit (INDEPENDENT_AMBULATORY_CARE_PROVIDER_SITE_OTHER): Payer: Self-pay | Admitting: Internal Medicine

## 2019-07-15 ENCOUNTER — Telehealth (INDEPENDENT_AMBULATORY_CARE_PROVIDER_SITE_OTHER): Payer: Self-pay | Admitting: Nurse Practitioner

## 2019-07-15 ENCOUNTER — Ambulatory Visit (INDEPENDENT_AMBULATORY_CARE_PROVIDER_SITE_OTHER): Payer: Medicare Other | Admitting: Nurse Practitioner

## 2019-07-15 ENCOUNTER — Other Ambulatory Visit (INDEPENDENT_AMBULATORY_CARE_PROVIDER_SITE_OTHER): Payer: Self-pay | Admitting: Internal Medicine

## 2019-07-15 ENCOUNTER — Encounter (INDEPENDENT_AMBULATORY_CARE_PROVIDER_SITE_OTHER): Payer: Self-pay | Admitting: Nurse Practitioner

## 2019-07-15 ENCOUNTER — Other Ambulatory Visit: Payer: Self-pay

## 2019-07-15 DIAGNOSIS — E782 Mixed hyperlipidemia: Secondary | ICD-10-CM

## 2019-07-15 DIAGNOSIS — E039 Hypothyroidism, unspecified: Secondary | ICD-10-CM

## 2019-07-15 DIAGNOSIS — K219 Gastro-esophageal reflux disease without esophagitis: Secondary | ICD-10-CM | POA: Diagnosis not present

## 2019-07-15 DIAGNOSIS — K580 Irritable bowel syndrome with diarrhea: Secondary | ICD-10-CM

## 2019-07-15 DIAGNOSIS — E119 Type 2 diabetes mellitus without complications: Secondary | ICD-10-CM

## 2019-07-15 DIAGNOSIS — I10 Essential (primary) hypertension: Secondary | ICD-10-CM

## 2019-07-15 DIAGNOSIS — E1165 Type 2 diabetes mellitus with hyperglycemia: Secondary | ICD-10-CM

## 2019-07-15 DIAGNOSIS — E559 Vitamin D deficiency, unspecified: Secondary | ICD-10-CM | POA: Insufficient documentation

## 2019-07-15 MED ORDER — BYDUREON BCISE 2 MG/0.85ML ~~LOC~~ AUIJ: 2.0000 mg | AUTO-INJECTOR | SUBCUTANEOUS | 3 refills | Status: DC

## 2019-07-15 MED ORDER — ATORVASTATIN CALCIUM 10 MG PO TABS: 10.0000 mg | ORAL_TABLET | Freq: Every day | ORAL | 0 refills | Status: DC

## 2019-07-15 MED ORDER — OMEPRAZOLE 20 MG PO CPDR: 20.0000 mg | DELAYED_RELEASE_CAPSULE | Freq: Every day | ORAL | 0 refills | Status: DC

## 2019-07-15 MED ORDER — FAMOTIDINE 20 MG PO TABS: 20.0000 mg | ORAL_TABLET | Freq: Two times a day (BID) | ORAL | 2 refills | Status: DC

## 2019-07-15 MED ORDER — DICYCLOMINE HCL 10 MG PO CAPS: 10.0000 mg | ORAL_CAPSULE | Freq: Three times a day (TID) | ORAL | 2 refills | Status: DC

## 2019-07-15 NOTE — Assessment & Plan Note (Signed)
>>  ASSESSMENT AND PLAN FOR UNCONTROLLED DIABETES MELLITUS WITH HYPERGLYCEMIA, WITH LONG-TERM CURRENT USE OF INSULIN  ([facility]) WRITTEN ON 07/15/2019  8:09 AM BY GRAY, SARAH E, NP  I am concerned that patient's A1c is quite elevated.  I believe that she was trying to gain better control of her diabetes by participating in intermittent fasting as well as trying to reduce her intake of processed carbohydrates.  I do not think that she has been making these changes consistently.  At this point I am going to refer her to endocrinology for assistance in management.  I also told her that I think she needs to follow-up with her eye doctor as soon as possible based on the fact that she still has some thigh pain.  I did tell her that if she experiences any redness to her eye or vision loss that she should proceed to the emergency department for evaluation.  She tells me she understands.  She was also told to proceed to the lab sometime in the next couple of weeks at which time I will recheck her A1c and check her urine for albumin.

## 2019-07-15 NOTE — Patient Instructions (Signed)
Thank you for choosing [facility] as your medical provider! If you have any questions or concerns regarding your health care, please do not hesitate to call our office.  Continue medication as prescribed.  MAKE sure that you go to the [facility] lab across the street from [facility] within the next couple of weeks of your blood work.  You should hear from the endocrinologist office to schedule an appointment within the next 2 weeks.  Make sure to call your eye doctor to schedule an appointment with them as soon as possible as well.  Please follow-up as scheduled in 3 months. We look forward to seeing you again soon!   At [facility] we value your feedback. You may receive a survey about your visit today. Please share your experience as we strive to create trusting relationships with our patients to provide genuine, compassionate, quality care.  We appreciate your understanding and patience as we review any laboratory studies, imaging, and other diagnostic tests that are ordered as we care for you. We do our best to address any and all results in a timely manner. If you do not hear about test results within 1 week, please do not hesitate to contact us. If we referred you to a specialist during your visit or ordered imaging testing, contact the office if you have not been contacted to be scheduled within 1 weeks.  We also encourage the use of MyChart, which contains your medical information for your review as well. If you are not enrolled in this feature, an access code is on this after visit summary for your convenience. Thank you for allowing [place] to be involved in your care.

## 2019-07-15 NOTE — Assessment & Plan Note (Addendum)
I am concerned that patient's A1c is quite elevated.  I believe that she was trying to gain better control of her diabetes by participating in intermittent fasting as well as trying to reduce her intake of processed carbohydrates.  I do not think that she has been making these changes consistently.  At this point I am going to refer her to endocrinology for assistance in management.  I also told her that I think she needs to follow-up with her eye doctor as soon as possible based on the fact that she still has some thigh pain.  I did tell her that if she experiences any redness to her eye or vision loss that she should proceed to the emergency department for evaluation.  She tells me she understands.  She was also told to proceed to the lab sometime in the next couple of weeks at which time I will recheck her A1c and check her urine for albumin.

## 2019-07-15 NOTE — Assessment & Plan Note (Signed)
She will continue on her desiccated thyroid, and I have told her to go to the lab for blood work within the next couple of weeks to check her thyroid panel.

## 2019-07-15 NOTE — Assessment & Plan Note (Signed)
She would like a refill on her omeprazole and famotidine.  I will refill these today.

## 2019-07-15 NOTE — Assessment & Plan Note (Signed)
She will continue on her current medication regimen as prescribed for now.

## 2019-07-15 NOTE — Telephone Encounter (Signed)
Please call this patient sometime during the week of 07/18/19 and remind Kathryn [name] to go to the [facility] lab across the street from [facility] for Kathryn [name] blood work.  Please also try to get Kathryn [name] scheduled for a blood pressure check sometime within the next month at our office.  Thank you.

## 2019-07-15 NOTE — Assessment & Plan Note (Addendum)
I will refill her atorvastatin today.  She will continue on this medication.

## 2019-07-15 NOTE — Progress Notes (Signed)
Due to national recommendations of social distancing related to the [place] pandemic, an audio/visual tele-health visit was felt to be the most appropriate encounter type for this patient today. I connected with  Kathryn [name] on 07/15/19 utilizing audio/visual technology and verified that I am speaking with the correct person using two identifiers. The patient was located at their home, and I was located at home during the encounter. I discussed the limitations of evaluation and management by telemedicine. The patient expressed understanding and agreed to proceed.      Subjective:  Patient ID: Kathryn [name], female    DOB: 03/28/1971  Age: 49 y.o. MRN: [id number]  CC:  Chief Complaint  Patient presents with  . Hypertension  . Diabetes  . Hyperlipidemia  . Hypothyroidism  . Other    Vitamin D deficiency      HPI  This patient presents for virtual visit for the above.  Hypertension: She continues on her Coreg and losartan.  Unfortunately she tells me she has not been checking her blood pressure at home.  She does continue to be asymptomatic.  Diabetes: Last A1c was collected in November 2020.  It was 13.7.  She continues on insulin and GLP-1 therapy.  She tells me she has seen an eye doctor in the last year, but during review of systems does tell me that she has some eye pain in her left eye.  This is been present for many months at this point.  She tells me when she saw her eye doctor in August 2020 it was present at that time as well.  She denies any vision changes or redness to her eye.  Hyperlipidemia: Last lipid panel was collected in November 2020.  It looks fairly well controlled except for mildly elevated triglycerides. She continues on atorvastatin 10 mg daily, however she notes that she is out of this prescription and needs a refill.Lipid Panel         Component                Value               Date/Time                 CHOL                     166                  04/07/2019 1421           TRIG                     362 (H)             04/07/2019 1421           HDL                      44 (L)              04/07/2019 1421           CHOLHDL                  3.8                 04/07/2019 1421           LDLCALC                  78  04/07/2019 1421         Hypothyroidism: She continues on her desiccated thyroid, and tells me she is tolerating this well.  Vitamin D deficiency: Last serum level was checked in November 2020.  It was 41.  She is on 10,000 IUs of vitamin D3 daily.   Past Medical History:  Diagnosis Date  . Anemia   . Anxiety   . Asthma   . Depression   . GERD (gastroesophageal reflux disease)   . HLD (hyperlipidemia) 04/07/2019  . Hypertension   . Hypothyroidism, adult 04/07/2019  . Neuropathy   . Obesity (BMI 30.0-34.9) 04/07/2019  . Schizoaffective disorder ([place])   . Type II diabetes mellitus, uncontrolled ([place]) 04/27/2019      Family History  Problem Relation Age of Onset  . Hypertension Mother   . Alcohol abuse Mother   . Heart disease Mother   . Kidney disease Mother   . Hypertension Father   . Alcohol abuse Sister   . Stroke Other   . Diabetes Other   . Cancer Other   . Seizures Other   . Alcohol abuse Maternal Aunt   . Alcohol abuse Paternal Aunt   . Alcohol abuse Maternal Grandfather   . Alcohol abuse Maternal Grandmother   . Alcohol abuse Cousin   . Hearing loss Daughter     Social History   Social History Narrative   Married for 22 years.On disability secondary to schizophrenia.Lives with husband.   Social History   Tobacco Use  . Smoking status: Never Smoker  . Smokeless tobacco: Never Used  Substance Use Topics  . Alcohol use: No    Alcohol/week: 0.0 standard drinks     Current Meds  Medication Sig  . albuterol (PROVENTIL HFA;VENTOLIN HFA) 108 (90 Base) MCG/ACT inhaler Inhale 1-2 puffs into the lungs every 6 (six) hours as needed for wheezing or shortness of breath.  Marland Kitchen  aspirin EC 81 MG tablet Take 81 mg by mouth daily.  . benztropine (COGENTIN) 1 MG tablet Take 1 tablet (1 mg total) by mouth at bedtime.  . carvedilol (COREG) 12.5 MG tablet TAKE 1 TABLET BY MOUTH TWICE DAILY  . Cholecalciferol (VITAMIN D-3) 125 MCG (5000 UT) TABS Take 2 tablets by mouth daily.  . DULoxetine (CYMBALTA) 60 MG capsule TAKE 1 CAPSULE(60 MG) BY MOUTH TWICE DAILY  . famotidine (PEPCID) 20 MG tablet Take 1 tablet (20 mg total) by mouth 2 (two) times daily.  Marland Kitchen FLOVENT HFA 44 MCG/ACT inhaler Inhale 2 puffs into the lungs 2 (two) times daily as needed (wheezing or shortness of breath).   . gabapentin (NEURONTIN) 300 MG capsule TAKE 1 CAPSULE BY MOUTH THREE TIMES DAILY  . Insulin Glargine, 2 Unit Dial, (TOUJEO MAX SOLOSTAR) 300 UNIT/ML SOPN Inject 130 Units into the skin daily.   Marland Kitchen LINZESS 145 MCG CAPS capsule Take 145 mcg by mouth daily before breakfast.   . losartan (COZAAR) 50 MG tablet TAKE 1 TABLET BY MOUTH DAILY  . omeprazole (PRILOSEC) 20 MG capsule TAKE 1 CAPSULE BY MOUTH EVERY DAY  . risperiDONE (RISPERDAL) 2 MG tablet TAKE 1 TABLET BY MOUTH EVERY MORNING AND 3 TABLETS BY MOUTH EVERY NIGHT AT BEDTIME  . thyroid (NP THYROID) 90 MG tablet Take 1 tablet (90 mg total) by mouth daily.  . traZODone (DESYREL) 100 MG tablet Take 2 tablets (200 mg total) by mouth at bedtime.  . [DISCONTINUED] JANUVIA 100 MG tablet TAKE 1 TABLET BY MOUTH EVERY DAY    ROS:  Review  of Systems  Constitutional: Negative for fever and malaise/fatigue.  Eyes: Positive for pain. Negative for blurred vision and double vision.  Respiratory: Negative for cough, shortness of breath and wheezing.   Cardiovascular: Negative for chest pain and palpitations.  Neurological: Negative for dizziness and headaches.  Endo/Heme/Allergies: Negative for polydipsia.     Objective:   Today's Vitals: LMP 01/23/2013  Vitals with BMI 07/15/2019 06/08/2019 05/25/2019  Height - 5\' 2"  5\' 2"   Weight - 163 lbs 167 lbs 3 oz  BMI  - 123XX123 123XX123  Systolic (No Data) - [id number]  Diastolic (No Data) - 82  Pulse - - 83  Some encounter information is confidential and restricted. Go to Review Flowsheets activity to see all data.     Physical Exam Comprehensive physical exam not completed today as office visit was conducted remotely.  She appears well on the phone and is well-groomed, she is answering questions appropriately.  Patient was alert and oriented, and appeared to have appropriate judgment.       Assessment   1. Type 2 diabetes mellitus without complication, without long-term current use of insulin (HCC)       Tests ordered No orders of the defined types were placed in this encounter.    Plan: Please see assessment and plan per problem list below.   No orders of the defined types were placed in this encounter.   Patient to follow-up in 3 months  Ailene Ards, NP

## 2019-07-15 NOTE — Assessment & Plan Note (Signed)
She was told to go to the lab within the next couple of weeks at which time I will check her serum vitamin D and metabolic panel.

## 2019-07-18 ENCOUNTER — Ambulatory Visit (INDEPENDENT_AMBULATORY_CARE_PROVIDER_SITE_OTHER): Payer: Medicare Other | Admitting: Internal Medicine

## 2019-07-18 NOTE — Telephone Encounter (Signed)
Called Kathryn [name] and let her know to go gfet blood work. She said she would today or in morning.

## 2019-07-20 ENCOUNTER — Other Ambulatory Visit (INDEPENDENT_AMBULATORY_CARE_PROVIDER_SITE_OTHER): Payer: Self-pay | Admitting: Nurse Practitioner

## 2019-07-20 DIAGNOSIS — E1165 Type 2 diabetes mellitus with hyperglycemia: Secondary | ICD-10-CM

## 2019-07-20 LAB — COMPLETE METABOLIC PANEL WITH GFR
AG Ratio: 1.3 (calc) (ref 1.0–2.5)
ALT: 12 U/L (ref 6–29)
AST: 9 U/L — ABNORMAL LOW (ref 10–35)
Albumin: 4.4 g/dL (ref 3.6–5.1)
Alkaline phosphatase (APISO): 92 U/L (ref 31–125)
BUN: 15 mg/dL (ref 7–25)
CO2: 28 mmol/L (ref 20–32)
Calcium: 10 mg/dL (ref 8.6–10.2)
Chloride: 93 mmol/L — ABNORMAL LOW (ref 98–110)
Creat: 1.01 mg/dL (ref 0.50–1.10)
GFR, Est African American: 76 mL/min/{1.73_m2} (ref >=60)
GFR, Est Non African American: 66 mL/min/{1.73_m2} (ref >=60)
Globulin: 3.4 g/dL (calc) (ref 1.9–3.7)
Glucose, Bld: 478 mg/dL — ABNORMAL HIGH (ref 65–99)
Potassium: 4.1 mmol/L (ref 3.5–5.3)
Sodium: 131 mmol/L — ABNORMAL LOW (ref 135–146)
Total Bilirubin: 0.8 mg/dL (ref 0.2–1.2)
Total Protein: 7.8 g/dL (ref 6.1–8.1)

## 2019-07-20 LAB — MICROALBUMIN / CREATININE URINE RATIO
Creatinine, Urine: 33 mg/dL (ref 20–275)
Microalb, Ur: <0.2 mg/dL

## 2019-07-20 LAB — VITAMIN D 25 HYDROXY (VIT D DEFICIENCY, FRACTURES): Vit D, 25-Hydroxy: 32 ng/mL (ref 30–100)

## 2019-07-20 LAB — TSH: TSH: 1.15 mIU/L

## 2019-07-20 LAB — HEMOGLOBIN A1C: Hgb A1c MFr Bld: >14 % of total Hgb — ABNORMAL HIGH (ref <5.7)

## 2019-07-20 LAB — T4, FREE: Free T4: 1.3 ng/dL (ref 0.8–1.8)

## 2019-07-20 LAB — T3, FREE: T3, Free: 3.5 pg/mL (ref 2.3–4.2)

## 2019-07-20 NOTE — Progress Notes (Signed)
Called patient left voice mail for her to contact me back asap. Also appt to dr Dorris Fetch is 08/01/19.

## 2019-07-20 NOTE — Progress Notes (Signed)
referral to endocrinology for assistance with managing her diabetes.

## 2019-07-21 NOTE — Progress Notes (Signed)
Called today ; left message to call office back. No word back from patient.

## 2019-07-25 ENCOUNTER — Telehealth (INDEPENDENT_AMBULATORY_CARE_PROVIDER_SITE_OTHER): Payer: Self-pay | Admitting: Nurse Practitioner

## 2019-07-25 ENCOUNTER — Telehealth (INDEPENDENT_AMBULATORY_CARE_PROVIDER_SITE_OTHER): Payer: Self-pay | Admitting: Internal Medicine

## 2019-07-25 NOTE — Telephone Encounter (Signed)
This patient requested that I call her back regarding her lab work.  I did give her a call today, and she tells me that her blood sugars have been in the 300s when she checks it in the morning.  She is scheduled to see her endocrinologist in approximately 1 week.  I recommended that she increase her insulin from 130 units daily to 132 units daily.  I also encouraged her to focus on hydration while she is waiting to see her endocrinologist and that if she feels unwell in any way between now and her appointment next week that she should be evaluated emergently in the ER.  She tells me she understands.  She also mentions to me that she thinks that being on her Risperdal is what is spiking her blood sugars, I recommended that she discuss this with her endocrinologist and psychiatrist to determine if there are other medication she can try instead of her Risperdal.  She wonders if she should just stop Risperdal now, I told her that I would recommend she continue on the medication for now until she discusses other options with her endocrinologist and psychiatrist.  She tells me she understands.

## 2019-07-25 NOTE — Progress Notes (Signed)
Pt was called given instruction of referral and lab results. Pt wanted to talk to you to discuss her pshyiarty stated to her that the resipidol would run her sugars up . But she was afraid to stop at this time.

## 2019-07-25 NOTE — Telephone Encounter (Signed)
Nellie, Please call this patient back regarding her lab results.  If she is scheduled to see Dr. Dorris Fetch within the next week then she does not need to be seen by [place] first.  However if her appointment with Dr. Dorris Fetch is 2 weeks or longer away then she does need to be seen by [place] prior to that appointment.  Thank you.

## 2019-07-30 ENCOUNTER — Other Ambulatory Visit (INDEPENDENT_AMBULATORY_CARE_PROVIDER_SITE_OTHER): Payer: Self-pay | Admitting: Nurse Practitioner

## 2019-07-30 ENCOUNTER — Other Ambulatory Visit (INDEPENDENT_AMBULATORY_CARE_PROVIDER_SITE_OTHER): Payer: Self-pay | Admitting: Internal Medicine

## 2019-08-01 ENCOUNTER — Encounter: Payer: Self-pay | Admitting: "Endocrinology

## 2019-08-01 ENCOUNTER — Encounter: Payer: Self-pay | Admitting: Nutrition

## 2019-08-01 ENCOUNTER — Other Ambulatory Visit: Payer: Self-pay

## 2019-08-01 ENCOUNTER — Ambulatory Visit (INDEPENDENT_AMBULATORY_CARE_PROVIDER_SITE_OTHER): Payer: Medicare Other | Admitting: "Endocrinology

## 2019-08-01 ENCOUNTER — Encounter: Payer: Medicare Other | Attending: Internal Medicine | Admitting: Nutrition

## 2019-08-01 VITALS — BP 114/78 | HR 85 | Ht 62.0 in | Wt 158.4 lb

## 2019-08-01 DIAGNOSIS — E782 Mixed hyperlipidemia: Secondary | ICD-10-CM | POA: Diagnosis present

## 2019-08-01 DIAGNOSIS — I10 Essential (primary) hypertension: Secondary | ICD-10-CM

## 2019-08-01 DIAGNOSIS — E669 Obesity, unspecified: Secondary | ICD-10-CM | POA: Insufficient documentation

## 2019-08-01 DIAGNOSIS — E039 Hypothyroidism, unspecified: Secondary | ICD-10-CM

## 2019-08-01 DIAGNOSIS — F250 Schizoaffective disorder, bipolar type: Secondary | ICD-10-CM | POA: Insufficient documentation

## 2019-08-01 DIAGNOSIS — E1165 Type 2 diabetes mellitus with hyperglycemia: Secondary | ICD-10-CM | POA: Diagnosis not present

## 2019-08-01 MED ORDER — GLIPIZIDE ER 5 MG PO TB24: 5.0000 mg | ORAL_TABLET | Freq: Every day | ORAL | 3 refills | Status: DC

## 2019-08-01 MED ORDER — LEVOTHYROXINE SODIUM 75 MCG PO TABS: 75.0000 ug | ORAL_TABLET | Freq: Every day | ORAL | 3 refills | Status: DC

## 2019-08-01 NOTE — Progress Notes (Signed)
Endocrinology Consult Note       08/01/2019, 9:44 PM   Subjective:    Patient ID: Kathryn [name], female    DOB: 01-16-1971.  Kathryn [name] is being seen in consultation for management of currently uncontrolled symptomatic diabetes requested by  Doree Albee, MD.   Past Medical History:  Diagnosis Date  . Anemia   . Anxiety   . Asthma   . Depression   . GERD (gastroesophageal reflux disease)   . HLD (hyperlipidemia) 04/07/2019  . Hypertension   . Hypothyroidism, adult 04/07/2019  . Neuropathy   . Obesity (BMI 30.0-34.9) 04/07/2019  . Schizoaffective disorder ([place])   . Type II diabetes mellitus, uncontrolled ([place]) 04/27/2019    Past Surgical History:  Procedure Laterality Date  . BREAST SURGERY Right    biopsy  . CESAREAN SECTION    . CHOLECYSTECTOMY  06/02/2012   Procedure: LAPAROSCOPIC CHOLECYSTECTOMY;  Surgeon: Jamesetta So, MD;  Location: [facility];  Service: General;  Laterality: N/A;  Attempted laparoscopic cholecystectomy  . CHOLECYSTECTOMY  06/02/2012   Procedure: CHOLECYSTECTOMY;  Surgeon: Jamesetta So, MD;  Location: [facility];  Service: General;  Laterality: N/A;  converted to open at  0905  . ESOPHAGEAL DILATION  12/31/2018   Procedure: ESOPHAGEAL DILATION;  Surgeon: Rogene Houston, MD;  Location: [facility];  Service: Endoscopy;;  . ESOPHAGOGASTRODUODENOSCOPY (EGD) WITH PROPOFOL N/A 08/24/2015   Procedure: ESOPHAGOGASTRODUODENOSCOPY (EGD) WITH PROPOFOL;  Surgeon: Rogene Houston, MD;  Location: [facility];  Service: Endoscopy;  Laterality: N/A;  1:10 - Ann to notify pt to arrive at 11:30  . ESOPHAGOGASTRODUODENOSCOPY (EGD) WITH PROPOFOL N/A 12/31/2018   Procedure: ESOPHAGOGASTRODUODENOSCOPY (EGD) WITH PROPOFOL;  Surgeon: Rogene Houston, MD;  Location: [facility];  Service: Endoscopy;  Laterality: N/A;  . tooth removal  2019   all teeth removed  . TUBAL LIGATION     X3     Social History   Socioeconomic History  . Marital status: Married    Spouse name: Not on file  . Number of children: Not on file  . Years of education: Not on file  . Highest education level: Not on file  Occupational History  . Not on file  Tobacco Use  . Smoking status: Never Smoker  . Smokeless tobacco: Never Used  Substance and Sexual Activity  . Alcohol use: No    Alcohol/week: 0.0 standard drinks  . Drug use: No  . Sexual activity: Not Currently    Birth control/protection: Surgical  Other Topics Concern  . Not on file  Social History Narrative   Married for 22 years.On disability secondary to schizophrenia.Lives with husband.   Social Determinants of Health   Financial Resource Strain:   . Difficulty of Paying Living Expenses: Not on file  Food Insecurity:   . Worried About Charity fundraiser in the Last Year: Not on file  . Ran Out of Food in the Last Year: Not on file  Transportation Needs:   . Lack of Transportation (Medical): Not on file  . Lack of Transportation (Non-Medical): Not on file  Physical Activity:   .  Days of Exercise per Week: Not on file  . Minutes of Exercise per Session: Not on file  Stress:   . Feeling of Stress : Not on file  Social Connections:   . Frequency of Communication with Friends and Family: Not on file  . Frequency of Social Gatherings with Friends and Family: Not on file  . Attends Religious Services: Not on file  . Active Member of Clubs or Organizations: Not on file  . Attends Archivist Meetings: Not on file  . Marital Status: Not on file    Family History  Problem Relation Age of Onset  . Hypertension Mother   . Alcohol abuse Mother   . Heart disease Mother   . Kidney disease Mother   . Hypertension Father   . Alcohol abuse Sister   . Stroke Other   . Diabetes Other   . Cancer Other   . Seizures Other   . Alcohol abuse Maternal Aunt   . Alcohol abuse Paternal Aunt   . Alcohol abuse Maternal  Grandfather   . Alcohol abuse Maternal Grandmother   . Alcohol abuse Cousin   . Hearing loss Daughter     Outpatient Encounter Medications as of 08/01/2019  Medication Sig  . albuterol (PROVENTIL HFA;VENTOLIN HFA) 108 (90 Base) MCG/ACT inhaler Inhale 1-2 puffs into the lungs every 6 (six) hours as needed for wheezing or shortness of breath.  Marland Kitchen aspirin EC 81 MG tablet Take 81 mg by mouth daily.  Marland Kitchen atorvastatin (LIPITOR) 10 MG tablet Take 1 tablet (10 mg total) by mouth daily.  . benztropine (COGENTIN) 1 MG tablet Take 1 tablet (1 mg total) by mouth at bedtime.  . carvedilol (COREG) 12.5 MG tablet TAKE 1 TABLET BY MOUTH TWICE DAILY  . Cholecalciferol (VITAMIN D-3) 125 MCG (5000 UT) TABS Take 2 tablets by mouth daily.  Marland Kitchen dicyclomine (BENTYL) 10 MG capsule Take 1 capsule (10 mg total) by mouth 3 (three) times daily before meals.  . DULoxetine (CYMBALTA) 60 MG capsule TAKE 1 CAPSULE(60 MG) BY MOUTH TWICE DAILY  . Exenatide ER (BYDUREON BCISE) 2 MG/0.85ML AUIJ Inject 2 mg into the skin every Monday.  . famotidine (PEPCID) 20 MG tablet Take 1 tablet (20 mg total) by mouth 2 (two) times daily.  Marland Kitchen FLOVENT HFA 44 MCG/ACT inhaler Inhale 2 puffs into the lungs 2 (two) times daily as needed (wheezing or shortness of breath).   . gabapentin (NEURONTIN) 300 MG capsule TAKE 1 CAPSULE BY MOUTH THREE TIMES DAILY  . glipiZIDE (GLUCOTROL XL) 5 MG 24 hr tablet Take 1 tablet (5 mg total) by mouth daily with breakfast.  . Insulin Glargine, 2 Unit Dial, (TOUJEO MAX SOLOSTAR) 300 UNIT/ML SOPN Inject 100 Units into the skin daily.  Marland Kitchen levothyroxine (SYNTHROID) 75 MCG tablet Take 1 tablet (75 mcg total) by mouth daily before breakfast.  . LINZESS 145 MCG CAPS capsule TAKE 1 CAPSULE BY MOUTH DAILY  . losartan (COZAAR) 50 MG tablet TAKE 1 TABLET BY MOUTH DAILY  . omeprazole (PRILOSEC) 20 MG capsule Take 1 capsule (20 mg total) by mouth daily.  . risperiDONE (RISPERDAL) 2 MG tablet TAKE 1 TABLET BY MOUTH EVERY MORNING AND  3 TABLETS BY MOUTH EVERY NIGHT AT BEDTIME  . traZODone (DESYREL) 100 MG tablet Take 2 tablets (200 mg total) by mouth at bedtime.  . [DISCONTINUED] BYDUREON BCISE 2 MG/0.85ML AUIJ Inject 2 mg into the skin every Monday.   . [DISCONTINUED] carvedilol (COREG) 12.5 MG tablet Take 12.5 mg  by mouth 2 (two) times daily with a meal.   . [DISCONTINUED] gabapentin (NEURONTIN) 300 MG capsule Take 300 mg by mouth 3 (three) times daily.   . [DISCONTINUED] gabapentin (NEURONTIN) 300 MG capsule TAKE 1 CAPSULE BY MOUTH THREE TIMES DAILY  . [DISCONTINUED] JANUVIA 100 MG tablet Take 100 mg by mouth daily.  . [DISCONTINUED] JANUVIA 100 MG tablet TAKE 1 TABLET BY MOUTH EVERY DAY  . [DISCONTINUED] LINZESS 145 MCG CAPS capsule Take 145 mcg by mouth daily before breakfast.   . [DISCONTINUED] losartan (COZAAR) 25 MG tablet Take 25 mg daily by mouth.  . [DISCONTINUED] losartan (COZAAR) 50 MG tablet Take 1 tablet (50 mg total) by mouth daily.  . [DISCONTINUED] omeprazole (PRILOSEC) 20 MG capsule Take 1 capsule (20 mg total) by mouth daily before supper.  . [DISCONTINUED] thyroid (NP THYROID) 90 MG tablet Take 1 tablet (90 mg total) by mouth daily.   No facility-administered encounter medications on file as of 08/01/2019.    ALLERGIES: No Known Allergies  VACCINATION STATUS: Immunization History  Administered Date(s) Administered  . Influenza Split 06/03/2012  . Influenza,inj,Quad PF,6+ Mos 04/07/2019  . Pneumococcal Polysaccharide-23 06/03/2012  . Tdap 05/24/2014    Diabetes She presents for her initial diabetic visit. She has type 2 diabetes mellitus. Onset time: She was diagnosed at approximate age of 11 years. Her disease course has been worsening (Patient was previously seen in this practice however disappeared from care for unclear reasons.). There are no hypoglycemic associated symptoms. Pertinent negatives for hypoglycemia include no confusion, headaches, pallor or seizures. Associated symptoms include  fatigue, polydipsia and polyuria. Pertinent negatives for diabetes include no chest pain and no polyphagia. There are no hypoglycemic complications. Symptoms are worsening. Diabetic complications include peripheral neuropathy. Risk factors for coronary artery disease include dyslipidemia, diabetes mellitus, hypertension, obesity and sedentary lifestyle. Current diabetic treatments: She is currently on Bydureon 2 mg weekly, Toujeo 132 units daily. Her weight is fluctuating minimally. She is following a generally unhealthy diet. When asked about meal planning, she reported none. She has not had a previous visit with a dietitian. She rarely participates in exercise. Her home blood glucose trend is fluctuating minimally. (She did not bring any logs nor meter for review.  Her recent labs show a1c of  >14% .) An ACE inhibitor/angiotensin II receptor blocker is being taken. Eye exam is current.  Hyperlipidemia This is a chronic problem. The current episode started more than 1 year ago. The problem is uncontrolled. Recent lipid tests were reviewed and are high. Exacerbating diseases include diabetes and obesity. Pertinent negatives include no chest pain, myalgias or shortness of breath. Current antihyperlipidemic treatment includes statins. Risk factors for coronary artery disease include family history, dyslipidemia, obesity, hypertension, diabetes mellitus and a sedentary lifestyle.  Hypertension This is a chronic problem. The current episode started more than 1 year ago. The problem is controlled. Pertinent negatives include no chest pain, headaches, palpitations or shortness of breath. Risk factors for coronary artery disease include dyslipidemia, diabetes mellitus, obesity, family history and sedentary lifestyle. Past treatments include angiotensin blockers. Identifiable causes of hypertension include a thyroid problem.  Thyroid Problem Presents for initial visit. Onset time: She was diagnosed at approximate age  of 81 years. Symptoms include depressed mood and fatigue. Patient reports no cold intolerance, diarrhea, heat intolerance or palpitations. The symptoms have been worsening. Treatments tried: She is currently on natures Armour 90 mg p.o. daily. Her past medical history is significant for diabetes and hyperlipidemia.  Review of Systems  Constitutional: Positive for fatigue. Negative for chills, fever and unexpected weight change.  HENT: Negative for trouble swallowing and voice change.   Eyes: Negative for visual disturbance.  Respiratory: Negative for cough, shortness of breath and wheezing.   Cardiovascular: Negative for chest pain, palpitations and leg swelling.  Gastrointestinal: Negative for diarrhea, nausea and vomiting.  Endocrine: Positive for polydipsia and polyuria. Negative for cold intolerance, heat intolerance and polyphagia.  Musculoskeletal: Negative for arthralgias and myalgias.  Skin: Negative for color change, pallor, rash and wound.  Neurological: Negative for seizures and headaches.  Psychiatric/Behavioral: Negative for confusion and suicidal ideas.    Objective:    Vitals with BMI 08/01/2019 07/15/2019 06/08/2019  Height 5\' 2"  - 5\' 2"   Weight 158 lbs 6 oz - 163 lbs  BMI 123XX123 - 123XX123  Systolic 99991111 (No Data) -  Diastolic 78 (No Data) -  Pulse 85 - -  Some encounter information is confidential and restricted. Go to Review Flowsheets activity to see all data.    BP 114/78   Pulse 85   Ht 5\' 2"  (1.575 m)   Wt 158 lb 6.4 oz (71.8 kg)   LMP 01/23/2013   BMI 28.97 kg/m   Wt Readings from Last 3 Encounters:  08/01/19 158 lb 6.4 oz (71.8 kg)  06/08/19 163 lb (73.9 kg)  05/25/19 167 lb 3.2 oz (75.8 kg)     Physical Exam Constitutional:      Appearance: She is well-developed.  HENT:     Head: Normocephalic and atraumatic.  Neck:     Thyroid: No thyromegaly.     Trachea: No tracheal deviation.     Comments: She has palpable thyromegaly. Cardiovascular:      Rate and Rhythm: Normal rate and regular rhythm.  Pulmonary:     Effort: Pulmonary effort is normal.  Abdominal:     General: Abdomen is flat.     Tenderness: There is no abdominal tenderness. There is no guarding.  Musculoskeletal:        General: Normal range of motion.     Cervical back: Normal range of motion and neck supple.  Skin:    General: Skin is warm and dry.     Coloration: Skin is not pale.     Findings: No erythema or rash.  Neurological:     Mental Status: She is alert and oriented to person, place, and time.     Cranial Nerves: No cranial nerve deficit.     Coordination: Coordination normal.     Deep Tendon Reflexes: Reflexes are normal and symmetric.  Psychiatric:        Judgment: Judgment normal.     CMP ( most recent) CMP     Component Value Date/Time   NA 131 (L) 07/19/2019 0938   K 4.1 07/19/2019 0938   CL 93 (L) 07/19/2019 0938   CO2 28 07/19/2019 0938   GLUCOSE 478 (H) 07/19/2019 0938   BUN 15 07/19/2019 0938   CREATININE 1.01 07/19/2019 0938   CALCIUM 10.0 07/19/2019 0938   PROT 7.8 07/19/2019 0938   ALBUMIN 4.2 02/16/2017 0300   AST 9 (L) 07/19/2019 0938   ALT 12 07/19/2019 0938   ALKPHOS 91 02/16/2017 0300   BILITOT 0.8 07/19/2019 0938   GFRNONAA 66 07/19/2019 0938   GFRAA 76 07/19/2019 0938     Diabetic Labs (most recent): Lab Results  Component Value Date   HGBA1C >14.0 (H) 07/19/2019   HGBA1C 13.7 (H) 04/07/2019  HGBA1C 11.6 (H) 06/02/2012     Lipid Panel ( most recent) Lipid Panel     Component Value Date/Time   CHOL 166 04/07/2019 1421   TRIG 362 (H) 04/07/2019 1421   HDL 44 (L) 04/07/2019 1421   CHOLHDL 3.8 04/07/2019 1421   LDLCALC 78 04/07/2019 1421      Lab Results  Component Value Date   TSH 1.15 07/19/2019   TSH 1.57 04/07/2019   FREET4 1.3 07/19/2019      Assessment & Plan:   1. Uncontrolled type 2 diabetes mellitus with hyperglycemia (HCC)  - Kathryn [name] has currently uncontrolled symptomatic  type 2 DM since  49 years of age,  with most recent A1c of >14 %. Recent labs reviewed. - I had a long discussion with her about the progressive nature of diabetes and the pathology behind its complications. -her diabetes is complicated by noncompliance/nonadherence, obesity/sedentary life and she remains at a high risk for more acute and chronic complications which include CAD, CVA, CKD, retinopathy, and neuropathy. These are all discussed in detail with her.  - I have counseled her on diet  and weight management  by adopting a carbohydrate restricted/protein rich diet. Patient is encouraged to switch to  unprocessed or minimally processed     complex starch and increased protein intake (animal or plant source), fruits, and vegetables. -  she is advised to stick to a routine mealtimes to eat 3 meals  a day and avoid unnecessary snacks ( to snack only to correct hypoglycemia).   - she admits that there is a room for improvement in her food and drink choices. - Suggestion is made for her to avoid simple carbohydrates  from her diet including Cakes, Sweet Desserts, Ice Cream, Soda (diet and regular), Sweet Tea, Candies, Chips, Cookies, Store Bought Juices, Alcohol in Excess of  1-2 drinks a day, Artificial Sweeteners,  Coffee Creamer, and "Sugar-free" Products. This will help patient to have more stable blood glucose profile and potentially avoid unintended weight gain.  - she will be scheduled with Jearld Fenton, RDN, CDE for diabetes education.  - I have approached her with the following individualized plan to manage  her diabetes and patient agrees:   -Based on her chronic glycemic burden, she will likely need intensive treatment with basal/bolus insulin in order for her to achieve control of diabetes to target. In preparation, she is approached to initiate strict monitoring of blood glucose 4 times a day-  -before meals and at bedtime, and return in 10 days with her meter and logs for  evaluation. -She is on a particularly large dose of basal insulin, advised to lower her Toujeo to 100 units daily at bedtime. -She reports intolerance to Metformin.  She may benefit from glipizide therapy.  I discussed and added glipizide 5 mg XL p.o. at breakfast. -She is advised to continue Bydureon 2 mg subcutaneously weekly.  - she is encouraged to call clinic for blood glucose levels less than 70 or above 300 mg /dl.   - Specific targets for  A1c;  LDL, HDL,  and Triglycerides were discussed with the patient.  2) Blood Pressure /Hypertension:  her blood pressure is  controlled to target.   she is advised to continue her current medications including losartan 50 mg p.o. daily with breakfast . 3) Lipids/Hyperlipidemia:   Review of her recent lipid panel showed un controlled triglycerides at 362, and LDL at  67.  she  is advised to continue  atorvastatin 10 mg p.o. daily at bedtime.  Side effects and precautions discussed with her.  4)  Weight/Diet:  Body mass index is 28.97 kg/m.  -     she is  a candidate for modest weight loss. I discussed with her the fact that loss of 5 - 10% of her  current body weight will have the most impact on her diabetes management.  Exercise, and detailed carbohydrates information provided  -  detailed on discharge instructions.  5) hypothyroidism: Longstanding diagnosis.  She would benefit from levothyroxine instead of Armour Thyroid.  I discussed and switched her to levothyroxine 75 mcg p.o. daily before breakfast.   - We discussed about the correct intake of her thyroid hormone, on empty stomach at fasting, with water, separated by at least 30 minutes from breakfast and other medications,  and separated by more than 4 hours from calcium, iron, multivitamins, acid reflux medications (PPIs). -Patient is made aware of the fact that thyroid hormone replacement is needed for life, dose to be adjusted by periodic monitoring of thyroid function tests. -He has  palpable thyroid, will be considered for thyroid ultrasound after her next visit.   6) Chronic Care/Health Maintenance:  -she  is on ACEI/ARB and Statin medications and  is encouraged to initiate and continue to follow up with Ophthalmology, Dentist,  Podiatrist at least yearly or according to recommendations, and advised to   stay away from smoking. I have recommended yearly flu vaccine and pneumonia vaccine at least every 5 years; moderate intensity exercise for up to 150 minutes weekly; and  sleep for at least 7 hours a day.  - she is  advised to maintain close follow up with Doree Albee, MD for primary care needs, as well as her other providers for optimal and coordinated care.   - Time spent in this patient care: 65 min, of which > 50% was spent in  counseling  her about her chronically uncontrolled type 2 diabetes, hyperlipidemia, hypertension, hypothyroidism, goiter and the rest reviewing her blood glucose logs , discussing her hypoglycemia and hyperglycemia episodes, reviewing her current and  previous labs / studies  ( including abstraction from other facilities) and medications  doses and developing a  long term treatment plan based on the latest standards of care/ guidelines; and documenting her care.    Please refer to Patient Instructions for Blood Glucose Monitoring and Insulin/Medications Dosing Guide"  in media tab for additional information. Please  also refer to " Patient Self Inventory" in the Media  tab for reviewed elements of pertinent patient history.  Kathryn [name] participated in the discussions, expressed understanding, and voiced agreement with the above plans.  All questions were answered to her satisfaction. she is encouraged to contact clinic should she have any questions or concerns prior to her return visit.   Follow up plan: - Return in about 10 days (around 08/11/2019), or office, for Follow up with Meter and Logs Only - no Labs.  Glade Lloyd, MD [facility] [address] [place], [place] [address] Phone: [phone]  Fax: [phone]    08/01/2019, 9:44 PM  This note was partially dictated with voice recognition software. Similar sounding words can be transcribed inadequately or may not  be corrected upon review.

## 2019-08-01 NOTE — Patient Instructions (Signed)

## 2019-08-01 NOTE — Patient Instructions (Signed)
Goals  Ea three meals a day Increase walking water Increase lower carb vegetables. Drink more water Don't skip meals Get A1C down to 7% Blood sugars before meals less than 150 mg/dl Toujeo 100 units a day Bydureon weekly Glipizide daily.

## 2019-08-01 NOTE — Progress Notes (Signed)
  Medical Nutrition Therapy:  Appt start time: [id number] end time:  1600.   Assessment:  Primary concerns today: Diabetes Type 2, Walk in in from Dr. Dorris Fetch. Will do safety questions next visit. LIves with her husband. She and husband shop and cook. Eats 2 meals per day.  Toujeo 100 units once a day. Bydureon weekly. Glipizixzde 5 mg once a day.  Is on Respiradol and it can cause hyperglycemia. Complains of thirst, frequent urination and sleepy often. Has been working on cutting out junk food and sweets. Motivated to make changes with diet and exercise to help improve DM.   Preferred Learning Style:     No preference indicated   Learning Readiness:   Ready  Change in progress   MEDICATIONS: see list   DIETARY INTAKE:  Eats 2 meals a day usually. Skips breakfast   Usual physical activity: walks  Estimated energy needs: 1200 calories 135 g carbohydrates 90 g protein 33 g fat  Progress Towards Goal(s):  In progress.   Nutritional Diagnosis:  NB-1.1 Food and nutrition-related knowledge deficit As related to Diabetes Type 2.  As evidenced by A1C > 14%.    Intervention: Nutrition and Diabetes education provided on My Plate, CHO counting, meal planning, portion sizes, timing of meals, avoiding snacks between meals unless having a low blood sugar, target ranges for A1C and blood sugars, signs/symptoms and treatment of hyper/hypoglycemia, monitoring blood sugars, taking medications as prescribed, benefits of exercising 30 minutes per day and prevention of complications of DM. Marland Kitchen Goals  Ea three meals a day Increase walking water Increase lower carb vegetables. Drink more water Don't skip meals Get A1C down to 7% Blood sugars before meals less than 150 mg/dl Toujeo 100 units a day Bydureon weekly Glipizide daily   Teaching Method Utilized:  Visual Auditory Hands on  Handouts given during visit include:  The Plate Mthod   Meal Plan Card  Diabetes Instructions.      Barriers to learning/adherence to lifestyle change: mental health  Demonstrated degree of understanding via:  Teach Back   Monitoring/Evaluation:  Dietary intake, exercise, , and body weight in 2 weeks (s).

## 2019-08-03 ENCOUNTER — Telehealth (HOSPITAL_COMMUNITY): Payer: Self-pay | Admitting: "Endocrinology

## 2019-08-03 NOTE — Telephone Encounter (Signed)
08/03/19~LM on VM. MF

## 2019-08-12 ENCOUNTER — Ambulatory Visit: Payer: Medicare Other | Admitting: "Endocrinology

## 2019-08-12 ENCOUNTER — Other Ambulatory Visit: Payer: Self-pay

## 2019-08-12 ENCOUNTER — Ambulatory Visit (HOSPITAL_COMMUNITY)
Admission: RE | Admit: 2019-08-12 | Discharge: 2019-08-12 | Disposition: A | Discharge status: Home / Self Care | Payer: Medicare Other | Source: Ambulatory Visit | Attending: "Endocrinology | Admitting: "Endocrinology

## 2019-08-12 DIAGNOSIS — E039 Hypothyroidism, unspecified: Secondary | ICD-10-CM | POA: Insufficient documentation

## 2019-08-15 ENCOUNTER — Encounter: Payer: Medicare Other | Attending: "Endocrinology | Admitting: Nutrition

## 2019-08-15 ENCOUNTER — Encounter: Payer: Self-pay | Admitting: Nutrition

## 2019-08-15 ENCOUNTER — Other Ambulatory Visit: Payer: Self-pay

## 2019-08-15 ENCOUNTER — Encounter: Payer: Self-pay | Admitting: "Endocrinology

## 2019-08-15 ENCOUNTER — Ambulatory Visit (INDEPENDENT_AMBULATORY_CARE_PROVIDER_SITE_OTHER): Payer: Medicare Other | Admitting: "Endocrinology

## 2019-08-15 VITALS — BP 139/86 | HR 72 | Ht 62.0 in | Wt 164.4 lb

## 2019-08-15 DIAGNOSIS — E1165 Type 2 diabetes mellitus with hyperglycemia: Secondary | ICD-10-CM

## 2019-08-15 DIAGNOSIS — E782 Mixed hyperlipidemia: Secondary | ICD-10-CM | POA: Diagnosis not present

## 2019-08-15 DIAGNOSIS — E669 Obesity, unspecified: Secondary | ICD-10-CM | POA: Insufficient documentation

## 2019-08-15 DIAGNOSIS — E039 Hypothyroidism, unspecified: Secondary | ICD-10-CM | POA: Diagnosis not present

## 2019-08-15 DIAGNOSIS — I10 Essential (primary) hypertension: Secondary | ICD-10-CM

## 2019-08-15 MED ORDER — INSULIN LISPRO (1 UNIT DIAL) 100 UNIT/ML (KWIKPEN): 10.0000 [IU] | PEN_INJECTOR | Freq: Three times a day (TID) | SUBCUTANEOUS | 2 refills | Status: DC

## 2019-08-15 NOTE — Progress Notes (Signed)
  Medical Nutrition Therapy:  Appt start time: 1030  end time:  1100 Assessment:  Primary concerns today: Diabetes Type 2. Saw Dr. Dorris Fetch. Has been working on eating breakst now. Feels better.  FBS 200's in am due to respiradol  BS improving with insulin now.. Dr. Dorris Fetch Added Humalog 10 units and sliding scale with meals. Motivated to continue to make lifestyle changes. Cooks at home and doesn't eat out..  100 units Toujeo, Bydureon and Glipizide.  CMP Latest Ref Rng & Units 07/19/2019 04/07/2019 12/30/2018  Glucose 65 - 99 mg/dL 478(H) 211(H) 409(H)  BUN 7 - 25 mg/dL 15 17 16   Creatinine 0.50 - 1.10 mg/dL 1.01 1.14(H) 0.87  Sodium 135 - 146 mmol/L 131(L) 139 133(L)  Potassium 3.5 - 5.3 mmol/L 4.1 3.9 3.9  Chloride 98 - 110 mmol/L 93(L) 101 98  CO2 20 - 32 mmol/L 28 27 27   Calcium 8.6 - 10.2 mg/dL 10.0 9.5 8.9  Total Protein 6.1 - 8.1 g/dL 7.8 6.8 -  Total Bilirubin 0.2 - 1.2 mg/dL 0.8 0.6 -  Alkaline Phos 38 - 126 U/L - - -  AST 10 - 35 U/L 9(L) 11 -  ALT 6 - 29 U/L 12 17 -   Lab Results  Component Value Date   HGBA1C >14.0 (H) 07/19/2019     Preferred Learning Style:     No preference indicated   Learning Readiness:   Ready  Change in progress   MEDICATIONS:    DIETARY INTAKE:  Eats 2 meals a day usually. Skips breakfast   Usual physical activity: walks  Estimated energy needs: 1200 calories 135 g carbohydrates 90 g protein 33 g fat  Progress Towards Goal(s):  In progress.   Nutritional Diagnosis:  NB-1.1 Food and nutrition-related knowledge deficit As related to Diabetes Type 2.  As evidenced by A1C > 14%.    Intervention: Nutrition and Diabetes education provided on My Plate, CHO counting, meal planning, portion sizes, timing of meals, avoiding snacks between meals unless having a low blood sugar, target ranges for A1C and blood sugars, signs/symptoms and treatment of hyper/hypoglycemia, monitoring blood sugars, taking medications as prescribed, benefits of  exercising 30 minutes per day and prevention of complications of DM. Marland Kitchen Goals Keep up the good job! Eat three meals a day Increase walking and  Water intake. Increase lower carb vegetables.  Eat 30-45 grams of carbs per meal Watch UTU[employer] cooking videos for meal ideas. Get A1C to 7%  Teaching Method Utilized:  Visual Auditory Hands on  Handouts given during visit include:  The Plate Mthod   Meal Plan Card  Diabetes Instructions.     Barriers to learning/adherence to lifestyle change: mental health  Demonstrated degree of understanding via:  Teach Back   Monitoring/Evaluation:  Dietary intake, exercise, , and body weight in 2 weeks (s).

## 2019-08-15 NOTE — Patient Instructions (Signed)

## 2019-08-15 NOTE — Patient Instructions (Signed)
.   Goals Keep up the good job! Eat three meals a day Increase walking and  Water intake. Increase lower carb vegetables.  Eat 30-45 grams of carbs per meal Watch [employer]UBE cooking videos for meal ideas. Get A1C to 7%

## 2019-08-15 NOTE — Progress Notes (Signed)
08/15/2019, 6:32 PM  Endocrinology follow-up note   Subjective:    Patient ID: Kathryn [name], female    DOB: 09/25/1970.  [name] is being seen in follow-up after she was seen in consultation for management of currently uncontrolled symptomatic diabetes requested by  Kathryn Albee, MD.   Past Medical History:  Diagnosis Date  . Anemia   . Anxiety   . Asthma   . Depression   . GERD (gastroesophageal reflux disease)   . HLD (hyperlipidemia) 04/07/2019  . Hypertension   . Hypothyroidism, adult 04/07/2019  . Neuropathy   . Obesity (BMI 30.0-34.9) 04/07/2019  . Schizoaffective disorder ([place])   . Type II diabetes mellitus, uncontrolled ([place]) 04/27/2019    Past Surgical History:  Procedure Laterality Date  . BREAST SURGERY Right    biopsy  . CESAREAN SECTION    . CHOLECYSTECTOMY  06/02/2012   Procedure: LAPAROSCOPIC CHOLECYSTECTOMY;  Surgeon: Jamesetta So, MD;  Location: [facility];  Service: General;  Laterality: N/A;  Attempted laparoscopic cholecystectomy  . CHOLECYSTECTOMY  06/02/2012   Procedure: CHOLECYSTECTOMY;  Surgeon: Jamesetta So, MD;  Location: [facility];  Service: General;  Laterality: N/A;  converted to open at  0905  . ESOPHAGEAL DILATION  12/31/2018   Procedure: ESOPHAGEAL DILATION;  Surgeon: Rogene Houston, MD;  Location: [facility];  Service: Endoscopy;;  . ESOPHAGOGASTRODUODENOSCOPY (EGD) WITH PROPOFOL N/A 08/24/2015   Procedure: ESOPHAGOGASTRODUODENOSCOPY (EGD) WITH PROPOFOL;  Surgeon: Rogene Houston, MD;  Location: [facility];  Service: Endoscopy;  Laterality: N/A;  1:10 - Ann to notify pt to arrive at 11:30  . ESOPHAGOGASTRODUODENOSCOPY (EGD) WITH PROPOFOL N/A 12/31/2018   Procedure: ESOPHAGOGASTRODUODENOSCOPY (EGD) WITH PROPOFOL;  Surgeon: Rogene Houston, MD;  Location: [facility];  Service: Endoscopy;  Laterality: N/A;  . tooth removal  2019   all teeth removed  . TUBAL  LIGATION     X3    Social History   Socioeconomic History  . Marital status: Married    Spouse name: Not on file  . Number of children: Not on file  . Years of education: Not on file  . Highest education level: Not on file  Occupational History  . Not on file  Tobacco Use  . Smoking status: Never Smoker  . Smokeless tobacco: Never Used  Substance and Sexual Activity  . Alcohol use: No    Alcohol/week: 0.0 standard drinks  . Drug use: No  . Sexual activity: Not Currently    Birth control/protection: Surgical  Other Topics Concern  . Not on file  Social History Narrative   Married for 22 years.On disability secondary to schizophrenia.Lives with husband.   Social Determinants of Health   Financial Resource Strain:   . Difficulty of Paying Living Expenses:   Food Insecurity:   . Worried About Charity fundraiser in the Last Year:   . Arboriculturist in the Last Year:   Transportation Needs:   . Film/video editor (Medical):   Marland Kitchen Lack of Transportation (Non-Medical):   Physical Activity:   . Days of Exercise per Week:   . Minutes of Exercise per Session:   Stress:   . Feeling of  Stress :   Social Connections:   . Frequency of Communication with Friends and Family:   . Frequency of Social Gatherings with Friends and Family:   . Attends Religious Services:   . Active Member of Clubs or Organizations:   . Attends Archivist Meetings:   Marland Kitchen Marital Status:     Family History  Problem Relation Age of Onset  . Hypertension Mother   . Alcohol abuse Mother   . Heart disease Mother   . Kidney disease Mother   . Hypertension Father   . Alcohol abuse Sister   . Stroke Other   . Diabetes Other   . Cancer Other   . Seizures Other   . Alcohol abuse Maternal Aunt   . Alcohol abuse Paternal Aunt   . Alcohol abuse Maternal Grandfather   . Alcohol abuse Maternal Grandmother   . Alcohol abuse Cousin   . Hearing loss Daughter     Outpatient Encounter  Medications as of 08/15/2019  Medication Sig  . albuterol (PROVENTIL HFA;VENTOLIN HFA) 108 (90 Base) MCG/ACT inhaler Inhale 1-2 puffs into the lungs every 6 (six) hours as needed for wheezing or shortness of breath.  Marland Kitchen aspirin EC 81 MG tablet Take 81 mg by mouth daily.  Marland Kitchen atorvastatin (LIPITOR) 10 MG tablet Take 1 tablet (10 mg total) by mouth daily.  . benztropine (COGENTIN) 1 MG tablet Take 1 tablet (1 mg total) by mouth at bedtime.  . carvedilol (COREG) 12.5 MG tablet TAKE 1 TABLET BY MOUTH TWICE DAILY  . Cholecalciferol (VITAMIN D-3) 125 MCG (5000 UT) TABS Take 2 tablets by mouth daily.  Marland Kitchen dicyclomine (BENTYL) 10 MG capsule Take 1 capsule (10 mg total) by mouth 3 (three) times daily before meals.  . DULoxetine (CYMBALTA) 60 MG capsule TAKE 1 CAPSULE(60 MG) BY MOUTH TWICE DAILY  . Exenatide ER (BYDUREON BCISE) 2 MG/0.85ML AUIJ Inject 2 mg into the skin every Monday.  . famotidine (PEPCID) 20 MG tablet Take 1 tablet (20 mg total) by mouth 2 (two) times daily.  Marland Kitchen FLOVENT HFA 44 MCG/ACT inhaler Inhale 2 puffs into the lungs 2 (two) times daily as needed (wheezing or shortness of breath).   . gabapentin (NEURONTIN) 300 MG capsule TAKE 1 CAPSULE BY MOUTH THREE TIMES DAILY  . glipiZIDE (GLUCOTROL XL) 5 MG 24 hr tablet Take 1 tablet (5 mg total) by mouth daily with breakfast.  . Insulin Glargine, 2 Unit Dial, (TOUJEO MAX SOLOSTAR) 300 UNIT/ML SOPN Inject 100 Units into the skin daily.  . insulin lispro (HUMALOG KWIKPEN) 100 UNIT/ML KwikPen Inject 0.1-0.16 mLs (10-16 Units total) into the skin 3 (three) times daily.  Marland Kitchen levothyroxine (SYNTHROID) 75 MCG tablet Take 1 tablet (75 mcg total) by mouth daily before breakfast.  . LINZESS 145 MCG CAPS capsule TAKE 1 CAPSULE BY MOUTH DAILY  . losartan (COZAAR) 50 MG tablet TAKE 1 TABLET BY MOUTH DAILY  . omeprazole (PRILOSEC) 20 MG capsule Take 1 capsule (20 mg total) by mouth daily.  . risperiDONE (RISPERDAL) 2 MG tablet TAKE 1 TABLET BY MOUTH EVERY MORNING  AND 3 TABLETS BY MOUTH EVERY NIGHT AT BEDTIME  . traZODone (DESYREL) 100 MG tablet Take 2 tablets (200 mg total) by mouth at bedtime.  . [DISCONTINUED] BYDUREON BCISE 2 MG/0.85ML AUIJ Inject 2 mg into the skin every Monday.   . [DISCONTINUED] carvedilol (COREG) 12.5 MG tablet Take 12.5 mg by mouth 2 (two) times daily with a meal.   . [DISCONTINUED] gabapentin (NEURONTIN) 300 MG  capsule Take 300 mg by mouth 3 (three) times daily.   . [DISCONTINUED] gabapentin (NEURONTIN) 300 MG capsule TAKE 1 CAPSULE BY MOUTH THREE TIMES DAILY  . [DISCONTINUED] JANUVIA 100 MG tablet Take 100 mg by mouth daily.  . [DISCONTINUED] LINZESS 145 MCG CAPS capsule Take 145 mcg by mouth daily before breakfast.   . [DISCONTINUED] losartan (COZAAR) 25 MG tablet Take 25 mg daily by mouth.  . [DISCONTINUED] losartan (COZAAR) 50 MG tablet Take 1 tablet (50 mg total) by mouth daily.  . [DISCONTINUED] omeprazole (PRILOSEC) 20 MG capsule Take 1 capsule (20 mg total) by mouth daily before supper.   No facility-administered encounter medications on file as of 08/15/2019.    ALLERGIES: No Known Allergies  VACCINATION STATUS: Immunization History  Administered Date(s) Administered  . Influenza Split 06/03/2012  . Influenza,inj,Quad PF,6+ Mos 04/07/2019  . Pneumococcal Polysaccharide-23 06/03/2012  . Tdap 05/24/2014    Diabetes She presents for her follow-up diabetic visit. She has type 2 diabetes mellitus. Onset time: She was diagnosed at approximate age of 89 years. Her disease course has been worsening (Patient was previously seen in this practice however disappeared from care for unclear reasons.). There are no hypoglycemic associated symptoms. Pertinent negatives for hypoglycemia include no confusion, headaches, pallor or seizures. Associated symptoms include fatigue, polydipsia and polyuria. Pertinent negatives for diabetes include no chest pain and no polyphagia. There are no hypoglycemic complications. Symptoms are  worsening. Diabetic complications include peripheral neuropathy. Risk factors for coronary artery disease include dyslipidemia, diabetes mellitus, hypertension, obesity and sedentary lifestyle. Current diabetic treatments: She is currently on Bydureon 2 mg weekly, Toujeo 132 units daily. Her weight is fluctuating minimally. She is following a generally unhealthy diet. When asked about meal planning, she reported none. She has not had a previous visit with a dietitian. She rarely participates in exercise. Her home blood glucose trend is increasing steadily. Her breakfast blood glucose range is generally >200 mg/dl. Her lunch blood glucose range is generally >200 mg/dl. Her dinner blood glucose range is generally >200 mg/dl. Her bedtime blood glucose range is generally >200 mg/dl. Her overall blood glucose range is >200 mg/dl. (She presents with significantly above target glycemic profile was fasting and postprandial.    Her recent labs show a1c of  >14% .) An ACE inhibitor/angiotensin II receptor blocker is being taken. Eye exam is current.  Hyperlipidemia This is a chronic problem. The current episode started more than 1 year ago. The problem is uncontrolled. Recent lipid tests were reviewed and are high. Exacerbating diseases include diabetes and obesity. Pertinent negatives include no chest pain, myalgias or shortness of breath. Current antihyperlipidemic treatment includes statins. Risk factors for coronary artery disease include family history, dyslipidemia, obesity, hypertension, diabetes mellitus and a sedentary lifestyle.  Hypertension This is a chronic problem. The current episode started more than 1 year ago. The problem is controlled. Pertinent negatives include no chest pain, headaches, palpitations or shortness of breath. Risk factors for coronary artery disease include dyslipidemia, diabetes mellitus, obesity, family history and sedentary lifestyle. Past treatments include angiotensin blockers.  Identifiable causes of hypertension include a thyroid problem.  Thyroid Problem Presents for initial visit. Onset time: She was diagnosed at approximate age of 77 years. Symptoms include depressed mood and fatigue. Patient reports no cold intolerance, diarrhea, heat intolerance or palpitations. The symptoms have been worsening. Treatments tried: She is currently on natures Armour 90 mg p.o. daily. Her past medical history is significant for diabetes and hyperlipidemia.  Review of Systems  Constitutional: Positive for fatigue. Negative for chills, fever and unexpected weight change.  HENT: Negative for trouble swallowing and voice change.   Eyes: Negative for visual disturbance.  Respiratory: Negative for cough, shortness of breath and wheezing.   Cardiovascular: Negative for chest pain, palpitations and leg swelling.  Gastrointestinal: Negative for diarrhea, nausea and vomiting.  Endocrine: Positive for polydipsia and polyuria. Negative for cold intolerance, heat intolerance and polyphagia.  Musculoskeletal: Negative for arthralgias and myalgias.  Skin: Negative for color change, pallor, rash and wound.  Neurological: Negative for seizures and headaches.  Psychiatric/Behavioral: Negative for confusion and suicidal ideas.    Objective:    Vitals with BMI 08/15/2019 08/01/2019 07/15/2019  Height 5\' 2"  5\' 2"  -  Weight 164 lbs 6 oz 158 lbs 6 oz -  BMI 123456 [id number] -  Systolic [id number] (No Data)  Diastolic 86 78 (No Data)  Pulse 72 85 -  Some encounter information is confidential and restricted. Go to Review Flowsheets activity to see all data.    BP 139/86   Pulse 72   Ht 5\' 2"  (1.575 m)   Wt 164 lb 6.4 oz (74.6 kg)   LMP 01/23/2013   BMI 30.07 kg/m   Wt Readings from Last 3 Encounters:  08/15/19 164 lb 6.4 oz (74.6 kg)  08/01/19 158 lb 6.4 oz (71.8 kg)  06/08/19 163 lb (73.9 kg)     Physical Exam Constitutional:      Appearance: She is well-developed.  HENT:     Head:  Normocephalic and atraumatic.  Neck:     Thyroid: No thyromegaly.     Trachea: No tracheal deviation.     Comments: She has palpable thyromegaly. Cardiovascular:     Rate and Rhythm: Normal rate and regular rhythm.  Pulmonary:     Effort: Pulmonary effort is normal.  Abdominal:     General: Abdomen is flat.     Tenderness: There is no abdominal tenderness. There is no guarding.  Musculoskeletal:        General: Normal range of motion.     Cervical back: Normal range of motion and neck supple.  Skin:    General: Skin is warm and dry.     Coloration: Skin is not pale.     Findings: No erythema or rash.  Neurological:     Mental Status: She is alert and oriented to person, place, and time.     Cranial Nerves: No cranial nerve deficit.     Coordination: Coordination normal.     Deep Tendon Reflexes: Reflexes are normal and symmetric.  Psychiatric:        Judgment: Judgment normal.     CMP     Component Value Date/Time   NA 131 (L) 07/19/2019 0938   K 4.1 07/19/2019 0938   CL 93 (L) 07/19/2019 0938   CO2 28 07/19/2019 0938   GLUCOSE 478 (H) 07/19/2019 0938   BUN 15 07/19/2019 0938   CREATININE 1.01 07/19/2019 0938   CALCIUM 10.0 07/19/2019 0938   PROT 7.8 07/19/2019 0938   ALBUMIN 4.2 02/16/2017 0300   AST 9 (L) 07/19/2019 0938   ALT 12 07/19/2019 0938   ALKPHOS 91 02/16/2017 0300   BILITOT 0.8 07/19/2019 0938   GFRNONAA 66 07/19/2019 0938   GFRAA 76 07/19/2019 0938     Diabetic Labs (most recent): Lab Results  Component Value Date   HGBA1C >14.0 (H) 07/19/2019   HGBA1C 13.7 (H) 04/07/2019   HGBA1C 11.6 (  H) 06/02/2012     Lipid Panel ( most recent) Lipid Panel     Component Value Date/Time   CHOL 166 04/07/2019 1421   TRIG 362 (H) 04/07/2019 1421   HDL 44 (L) 04/07/2019 1421   CHOLHDL 3.8 04/07/2019 1421   LDLCALC 78 04/07/2019 1421      Lab Results  Component Value Date   TSH 1.15 07/19/2019   TSH 1.57 04/07/2019   FREET4 1.3 07/19/2019       Assessment & Plan:   1. Uncontrolled type 2 diabetes mellitus with hyperglycemia (HCC)  - Kathryn [name] has currently uncontrolled symptomatic type 2 DM since  49 years of age,  with most recent A1c of >14 %. Recent labs reviewed.  She presents with significantly above target glycemic profile was fasting and postprandial.    Her recent labs show a1c of  >14% .  - I had a long discussion with her about the progressive nature of diabetes and the pathology behind its complications. -her diabetes is complicated by noncompliance/nonadherence, obesity/sedentary life and she remains at a high risk for more acute and chronic complications which include CAD, CVA, CKD, retinopathy, and neuropathy. These are all discussed in detail with her.  - I have counseled her on diet  and weight management  by adopting a carbohydrate restricted/protein rich diet. Patient is encouraged to switch to  unprocessed or minimally processed     complex starch and increased protein intake (animal or plant source), fruits, and vegetables. -  she is advised to stick to a routine mealtimes to eat 3 meals  a day and avoid unnecessary snacks ( to snack only to correct hypoglycemia).   - she  admits there is a room for improvement in her diet and drink choices. -  Suggestion is made for her to avoid simple carbohydrates  from her diet including Cakes, Sweet Desserts / Pastries, Ice Cream, Soda (diet and regular), Sweet Tea, Candies, Chips, Cookies, Sweet Pastries,  Store Bought Juices, Alcohol in Excess of  1-2 drinks a day, Artificial Sweeteners, Coffee Creamer, and "Sugar-free" Products. This will help patient to have stable blood glucose profile and potentially avoid unintended weight gain.  - she will be scheduled with Jearld Fenton, RDN, CDE for diabetes education.  - I have approached her with the following individualized plan to manage  her diabetes and patient agrees:   -Based on her chronic glycemic burden, she  will  need intensive treatment with basal/bolus insulin in order for her to achieve control of diabetes to target. -She is advised to continue Toujeo 100 units nightly, discussed and initiated Humalog 10 units 3 times daily AC for blood glucose readings between 90-150 mg per DL, plus correction dose Humalog for readings above 150 mg per DL. -She is urged to continue monitoring blood glucose strictly 4 times a day-before meals and at bedtime, and as needed. -She would benefit from a CGM device, will be considered for a CGM device on her next visit. -She reports intolerance to Metformin.  She may benefit from glipizide therapy.  She is advised to continue  glipizide 5 mg XL p.o. at breakfast. -She is advised to continue Bydureon 2 mg subcutaneously weekly.  - she is encouraged to call clinic for blood glucose levels less than 70 or above 300 mg /dl.   - Specific targets for  A1c;  LDL, HDL,  and Triglycerides were discussed with the patient.  2) Blood Pressure /Hypertension: Her blood pressure is controlled to  target. she is advised to continue her current medications including losartan 50 mg p.o. daily with breakfast .  3) Lipids/Hyperlipidemia:   Review of her recent lipid panel showed un controlled triglycerides at 362, and LDL at  67.  she  is advised to continue   atorvastatin 10 mg daily at bedtime.  Side effects and precautions discussed with her.  4)  Weight/Diet:  Body mass index is 30.07 kg/m.  -     she is  a candidate for modest weight loss. I discussed with her the fact that loss of 5 - 10% of her  current body weight will have the most impact on her diabetes management.  Exercise, and detailed carbohydrates information provided  -  detailed on discharge instructions.  5) hypothyroidism: Longstanding diagnosis.  She would benefit from levothyroxine instead of Armour Thyroid.  I discussed and switched her to levothyroxine 75 mcg p.o. daily before breakfast.   - We discussed about the  correct intake of her thyroid hormone, on empty stomach at fasting, with water, separated by at least 30 minutes from breakfast and other medications,  and separated by more than 4 hours from calcium, iron, multivitamins, acid reflux medications (PPIs). -Patient is made aware of the fact that thyroid hormone replacement is needed for life, dose to be adjusted by periodic monitoring of thyroid function tests.   6) Chronic Care/Health Maintenance:  -she  is on ACEI/ARB and Statin medications and  is encouraged to initiate and continue to follow up with Ophthalmology, Dentist,  Podiatrist at least yearly or according to recommendations, and advised to   stay away from smoking. I have recommended yearly flu vaccine and pneumonia vaccine at least every 5 years; moderate intensity exercise for up to 150 minutes weekly; and  sleep for at least 7 hours a day.  - she is  advised to maintain close follow up with Kathryn Albee, MD for primary care needs, as well as her other providers for optimal and coordinated care.   - Time spent on this patient care encounter:  45 min, of which > 50% was spent in  counseling and the rest reviewing her blood glucose logs , discussing her hypoglycemia and hyperglycemia episodes, reviewing her current and  previous labs / studies  ( including abstraction from other facilities) and medications  doses and developing a  long term treatment plan and documenting her care.   Please refer to Patient Instructions for Blood Glucose Monitoring and Insulin/Medications Dosing Guide"  in media tab for additional information. Please  also refer to " Patient Self Inventory" in the Media  tab for reviewed elements of pertinent patient history.  Sarha A Weiss participated in the discussions, expressed understanding, and voiced agreement with the above plans.  All questions were answered to her satisfaction. she is encouraged to contact clinic should she have any questions or concerns prior  to her return visit.    Follow up plan: - Return in about 3 weeks (around 09/05/2019) for Follow up with Meter and Logs Only - no Labs.  Glade Lloyd, MD [facility] [address] [place], [place] [address] Phone: [phone]  Fax: [phone]    08/15/2019, 6:32 PM  This note was partially dictated with voice recognition software. Similar sounding words can be transcribed inadequately or may not  be corrected upon review.

## 2019-08-17 ENCOUNTER — Other Ambulatory Visit (INDEPENDENT_AMBULATORY_CARE_PROVIDER_SITE_OTHER): Payer: Self-pay

## 2019-08-17 ENCOUNTER — Encounter: Payer: Self-pay | Admitting: Nutrition

## 2019-08-17 MED ORDER — INSULIN LISPRO (1 UNIT DIAL) 100 UNIT/ML (KWIKPEN): 10.0000 [IU] | PEN_INJECTOR | Freq: Three times a day (TID) | SUBCUTANEOUS | 2 refills | Status: DC

## 2019-09-05 ENCOUNTER — Other Ambulatory Visit: Payer: Self-pay

## 2019-09-05 ENCOUNTER — Encounter (HOSPITAL_COMMUNITY): Payer: Self-pay | Admitting: Psychiatry

## 2019-09-05 ENCOUNTER — Ambulatory Visit (INDEPENDENT_AMBULATORY_CARE_PROVIDER_SITE_OTHER): Payer: Medicare Other | Admitting: Psychiatry

## 2019-09-05 DIAGNOSIS — F4310 Post-traumatic stress disorder, unspecified: Secondary | ICD-10-CM | POA: Diagnosis not present

## 2019-09-05 DIAGNOSIS — F251 Schizoaffective disorder, depressive type: Secondary | ICD-10-CM

## 2019-09-05 MED ORDER — BENZTROPINE MESYLATE 1 MG PO TABS: 1.0000 mg | ORAL_TABLET | Freq: Every day | ORAL | 2 refills | Status: DC

## 2019-09-05 MED ORDER — DULOXETINE HCL 60 MG PO CPEP: ORAL_CAPSULE | ORAL | 2 refills | Status: DC

## 2019-09-05 MED ORDER — RISPERIDONE 2 MG PO TABS: ORAL_TABLET | ORAL | 2 refills | Status: DC

## 2019-09-05 MED ORDER — TRAZODONE HCL 100 MG PO TABS: 200.0000 mg | ORAL_TABLET | Freq: Every day | ORAL | 2 refills | Status: DC

## 2019-09-05 NOTE — Progress Notes (Signed)
Virtual Visit via Telephone Note  I connected with Kathryn [name] on 09/05/19 at  3:00 PM EDT by telephone and verified that I am speaking with the correct person using two identifiers.   I discussed the limitations, risks, security and privacy concerns of performing an evaluation and management service by telephone and the availability of in person appointments. I also discussed with the patient that there may be a patient responsible charge related to this service. The patient expressed understanding and agreed to proceed.    I discussed the assessment and treatment plan with the patient. The patient was provided an opportunity to ask questions and all were answered. The patient agreed with the plan and demonstrated an understanding of the instructions.   The patient was advised to call back or seek an in-person evaluation if the symptoms worsen or if the condition fails to improve as anticipated.  I provided 15 minutes of non-face-to-face time during this encounter.   Kathryn Spiller, MD  [facility] MD/PA/NP OP Progress Note  09/05/2019 3:30 PM Kathryn [name]  MRN:  [id number]  Chief Complaint: Depression, anxiety, hallucinations HPI: This patient is a 49 year old separated black female who lives with her daughter in [place].  She has 2 other daughters and 4 grandchildren.  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  The patient returns after 3 months.  She states that she is doing okay from mental status standpoint.  She denies significant hallucinations paranoia difficulty sleeping or depression.  Apparently she is seeing a new endocrinologist, Dr. Dorris Fetch and he has added Humalog insulin and also has her seeing a nutritionist.  She states that she is working hard on her diet and drinking more fluids and trying to exercise 30 minutes a day.  She has lost some weight.  She asked if we could cut down her respite all but given her long  history of schizoaffective disorder and her regression into psychosis without enough antipsychotic I am reluctant to do this right now.  I want to see if she can get the A1c and blood sugar down with the new medication and dietary changes.  She voices agreement. Visit Diagnosis:    ICD-10-CM   1. Schizoaffective disorder, depressive type ([place])  F25.1   2. PTSD (post-traumatic stress disorder)  F43.10     Past Psychiatric History: 2 previous psychiatric hospitalizations for schizoaffective disorder  Past Medical History:  Past Medical History:  Diagnosis Date  . Anemia   . Anxiety   . Asthma   . Depression   . GERD (gastroesophageal reflux disease)   . HLD (hyperlipidemia) 04/07/2019  . Hypertension   . Hypothyroidism, adult 04/07/2019  . Neuropathy   . Obesity (BMI 30.0-34.9) 04/07/2019  . Schizoaffective disorder ([place])   . Type II diabetes mellitus, uncontrolled ([place]) 04/27/2019    Past Surgical History:  Procedure Laterality Date  . BREAST SURGERY Right    biopsy  . CESAREAN SECTION    . CHOLECYSTECTOMY  06/02/2012   Procedure: LAPAROSCOPIC CHOLECYSTECTOMY;  Surgeon: Jamesetta So, MD;  Location: [facility];  Service: General;  Laterality: N/A;  Attempted laparoscopic cholecystectomy  . CHOLECYSTECTOMY  06/02/2012   Procedure: CHOLECYSTECTOMY;  Surgeon: Jamesetta So, MD;  Location: [facility];  Service: General;  Laterality: N/A;  converted to open at  0905  . ESOPHAGEAL DILATION  12/31/2018   Procedure: ESOPHAGEAL DILATION;  Surgeon: Rogene Houston, MD;  Location: [facility];  Service: Endoscopy;;  . ESOPHAGOGASTRODUODENOSCOPY (EGD) WITH PROPOFOL N/A 08/24/2015   Procedure: ESOPHAGOGASTRODUODENOSCOPY (EGD) WITH PROPOFOL;  Surgeon: Rogene Houston, MD;  Location: [facility];  Service: Endoscopy;  Laterality: N/A;  1:10 - Ann to notify pt to arrive at 11:30  . ESOPHAGOGASTRODUODENOSCOPY (EGD) WITH PROPOFOL N/A 12/31/2018   Procedure: ESOPHAGOGASTRODUODENOSCOPY (EGD) WITH PROPOFOL;   Surgeon: Rogene Houston, MD;  Location: [facility];  Service: Endoscopy;  Laterality: N/A;  . tooth removal  2019   all teeth removed  . TUBAL LIGATION     X3    Family Psychiatric History: see below  Family History:  Family History  Problem Relation Age of Onset  . Hypertension Mother   . Alcohol abuse Mother   . Heart disease Mother   . Kidney disease Mother   . Hypertension Father   . Alcohol abuse Sister   . Stroke Other   . Diabetes Other   . Cancer Other   . Seizures Other   . Alcohol abuse Maternal Aunt   . Alcohol abuse Paternal Aunt   . Alcohol abuse Maternal Grandfather   . Alcohol abuse Maternal Grandmother   . Alcohol abuse Cousin   . Hearing loss Daughter     Social History:  Social History   Socioeconomic History  . Marital status: Married    Spouse name: Not on file  . Number of children: Not on file  . Years of education: Not on file  . Highest education level: Not on file  Occupational History  . Not on file  Tobacco Use  . Smoking status: Never Smoker  . Smokeless tobacco: Never Used  Substance and Sexual Activity  . Alcohol use: No    Alcohol/week: 0.0 standard drinks  . Drug use: No  . Sexual activity: Not Currently    Birth control/protection: Surgical  Other Topics Concern  . Not on file  Social History Narrative   Married for 22 years.On disability secondary to schizophrenia.Lives with husband.   Social Determinants of Health   Financial Resource Strain:   . Difficulty of Paying Living Expenses:   Food Insecurity:   . Worried About Charity fundraiser in the Last Year:   . Arboriculturist in the Last Year:   Transportation Needs:   . Film/video editor (Medical):   Marland Kitchen Lack of Transportation (Non-Medical):   Physical Activity:   . Days of Exercise per Week:   . Minutes of Exercise per Session:   Stress:   . Feeling of Stress :   Social Connections:   . Frequency of Communication with Friends and Family:   .  Frequency of Social Gatherings with Friends and Family:   . Attends Religious Services:   . Active Member of Clubs or Organizations:   . Attends Archivist Meetings:   Marland Kitchen Marital Status:     Allergies: No Known Allergies  Metabolic Disorder Labs: Lab Results  Component Value Date   HGBA1C >14.0 (H) 07/19/2019   MPG  07/19/2019     Comment:     eAG cannot be calculated. Hemoglobin A1c result exceeds the linearity of the assay.    MPG 346 04/07/2019   No results found for: PROLACTIN Lab Results  Component Value Date   CHOL 166 04/07/2019   TRIG 362 (H) 04/07/2019   HDL 44 (L) 04/07/2019   CHOLHDL 3.8 04/07/2019   LDLCALC 78 04/07/2019   Lab Results  Component Value Date   TSH  1.15 07/19/2019   TSH 1.57 04/07/2019    Therapeutic Level Labs: Lab Results  Component Value Date   LITHIUM <0.06 (L) 07/03/2016   LITHIUM 1.17 09/09/2015   No results found for: VALPROATE No components found for:  CBMZ  Current Medications: Current Outpatient Medications  Medication Sig Dispense Refill  . albuterol (PROVENTIL HFA;VENTOLIN HFA) 108 (90 Base) MCG/ACT inhaler Inhale 1-2 puffs into the lungs every 6 (six) hours as needed for wheezing or shortness of breath. 1 Inhaler 0  . aspirin EC 81 MG tablet Take 81 mg by mouth daily.    Marland Kitchen atorvastatin (LIPITOR) 10 MG tablet Take 1 tablet (10 mg total) by mouth daily. 90 tablet 0  . benztropine (COGENTIN) 1 MG tablet Take 1 tablet (1 mg total) by mouth at bedtime. 30 tablet 2  . carvedilol (COREG) 12.5 MG tablet TAKE 1 TABLET BY MOUTH TWICE DAILY 180 tablet 0  . Cholecalciferol (VITAMIN D-3) 125 MCG (5000 UT) TABS Take 2 tablets by mouth daily.    Marland Kitchen dicyclomine (BENTYL) 10 MG capsule Take 1 capsule (10 mg total) by mouth 3 (three) times daily before meals. 90 capsule 2  . DULoxetine (CYMBALTA) 60 MG capsule TAKE 1 CAPSULE(60 MG) BY MOUTH TWICE DAILY 60 capsule 2  . Exenatide ER (BYDUREON BCISE) 2 MG/0.85ML AUIJ Inject 2 mg into the  skin every Monday. 6.8 mL 3  . famotidine (PEPCID) 20 MG tablet Take 1 tablet (20 mg total) by mouth 2 (two) times daily. 60 tablet 2  . FLOVENT HFA 44 MCG/ACT inhaler Inhale 2 puffs into the lungs 2 (two) times daily as needed (wheezing or shortness of breath).     . gabapentin (NEURONTIN) 300 MG capsule TAKE 1 CAPSULE BY MOUTH THREE TIMES DAILY 90 capsule 3  . glipiZIDE (GLUCOTROL XL) 5 MG 24 hr tablet Take 1 tablet (5 mg total) by mouth daily with breakfast. 30 tablet 3  . Insulin Glargine, 2 Unit Dial, (TOUJEO MAX SOLOSTAR) 300 UNIT/ML SOPN Inject 100 Units into the skin daily.    . insulin lispro (HUMALOG KWIKPEN) 100 UNIT/ML KwikPen Inject 0.1-0.16 mLs (10-16 Units total) into the skin 3 (three) times daily. 5 pen 2  . levothyroxine (SYNTHROID) 75 MCG tablet Take 1 tablet (75 mcg total) by mouth daily before breakfast. 30 tablet 3  . LINZESS 145 MCG CAPS capsule TAKE 1 CAPSULE BY MOUTH DAILY 90 capsule 0  . losartan (COZAAR) 50 MG tablet TAKE 1 TABLET BY MOUTH DAILY 90 tablet 0  . omeprazole (PRILOSEC) 20 MG capsule Take 1 capsule (20 mg total) by mouth daily. 90 capsule 0  . risperiDONE (RISPERDAL) 2 MG tablet TAKE 1 TABLET BY MOUTH EVERY MORNING AND 3 TABLETS BY MOUTH EVERY NIGHT AT BEDTIME 120 tablet 2  . traZODone (DESYREL) 100 MG tablet Take 2 tablets (200 mg total) by mouth at bedtime. 60 tablet 2   No current facility-administered medications for this visit.     Musculoskeletal: Strength & Muscle Tone: within normal limits Gait & Station: normal Patient leans: N/A  Psychiatric Specialty Exam: Review of Systems  All other systems reviewed and are negative.   Last menstrual period 01/23/2013.There is no height or weight on file to calculate BMI.  General Appearance: NA  Eye Contact:  NA  Speech:  Clear and Coherent  Volume:  Normal  Mood:  Euthymic  Affect:  NA  Thought Process:  Goal Directed  Orientation:  Full (Time, Place, and Person)  Thought Content: WDL  Suicidal Thoughts:  No  Homicidal Thoughts:  No  Memory:  Immediate;   Good Recent;   Fair Remote;   Fair  Judgement:  Fair  Insight:  Fair  Psychomotor Activity:  Normal  Concentration:  Concentration: Good and Attention Span: Good  Recall:  [employer] of Knowledge: Fair  Language: Good  Akathisia:  No  Handed:  Right  AIMS (if indicated): not done  Assets:  Communication Skills Desire for Improvement Resilience Social Support Talents/Skills  ADL's:  Intact  Cognition: WNL  Sleep:  Good   Screenings: PHQ2-9     Nutrition from 08/15/2019 in [facility] Visit from 08/03/2017 in [place] [facility] Telephone from 03/03/2017 in [place] [facility] Telephone from 02/04/2017 in [facility] Telephone from 01/07/2017 in [place]  PHQ-2 Total Score  0  0  0  0  1       Assessment and Plan: This patient is a 49 year old female with a history of's severe schizoaffective disorder and posttraumatic stress disorder.  From a psychiatric standpoint she has been stable.  She will continue Risperdal 2 mg in the morning and 6 mg in the bedtime for schizophrenia, Cogentin 1 mg at bedtime to prevent side effects from Risperdal, Cymbalta 60 mg twice daily for depression and trazodone 200 mg at bedtime for sleep.  She will return to see me in 3 months.  By that time her endocrinologist will review check her A1c and we will see if she has made some progress.   Kathryn Spiller, MD 09/05/2019, 3:30 PM

## 2019-09-06 ENCOUNTER — Telehealth (INDEPENDENT_AMBULATORY_CARE_PROVIDER_SITE_OTHER): Payer: Self-pay

## 2019-09-06 NOTE — Telephone Encounter (Signed)
Thank you. She has appointment tomorrow, we will take care of her refill needs.

## 2019-09-07 ENCOUNTER — Ambulatory Visit: Payer: Medicare Other | Admitting: Nutrition

## 2019-09-07 ENCOUNTER — Ambulatory Visit: Payer: Medicare Other | Admitting: "Endocrinology

## 2019-09-15 ENCOUNTER — Encounter (HOSPITAL_COMMUNITY): Payer: Self-pay

## 2019-09-15 ENCOUNTER — Emergency Department (HOSPITAL_COMMUNITY)
Admission: EM | Admit: 2019-09-15 | Discharge: 2019-09-15 | Disposition: A | Discharge status: Home / Self Care | Payer: Medicare Other | Attending: Emergency Medicine | Admitting: Emergency Medicine

## 2019-09-15 ENCOUNTER — Other Ambulatory Visit: Payer: Self-pay

## 2019-09-15 DIAGNOSIS — R112 Nausea with vomiting, unspecified: Secondary | ICD-10-CM | POA: Diagnosis present

## 2019-09-15 DIAGNOSIS — I10 Essential (primary) hypertension: Secondary | ICD-10-CM | POA: Diagnosis not present

## 2019-09-15 DIAGNOSIS — R197 Diarrhea, unspecified: Secondary | ICD-10-CM | POA: Diagnosis not present

## 2019-09-15 DIAGNOSIS — Z7982 Long term (current) use of aspirin: Secondary | ICD-10-CM | POA: Diagnosis not present

## 2019-09-15 DIAGNOSIS — J45909 Unspecified asthma, uncomplicated: Secondary | ICD-10-CM | POA: Insufficient documentation

## 2019-09-15 DIAGNOSIS — E039 Hypothyroidism, unspecified: Secondary | ICD-10-CM | POA: Insufficient documentation

## 2019-09-15 DIAGNOSIS — Z794 Long term (current) use of insulin: Secondary | ICD-10-CM | POA: Diagnosis not present

## 2019-09-15 DIAGNOSIS — Z79899 Other long term (current) drug therapy: Secondary | ICD-10-CM | POA: Insufficient documentation

## 2019-09-15 DIAGNOSIS — E1140 Type 2 diabetes mellitus with diabetic neuropathy, unspecified: Secondary | ICD-10-CM | POA: Insufficient documentation

## 2019-09-15 LAB — URINALYSIS, ROUTINE W REFLEX MICROSCOPIC
Bacteria, UA: NONE SEEN
Bilirubin Urine: NEGATIVE
Glucose, UA: >=500 mg/dL — AB
Hgb urine dipstick: NEGATIVE
Ketones, ur: NEGATIVE mg/dL
Leukocytes,Ua: NEGATIVE
Nitrite: NEGATIVE
Protein, ur: NEGATIVE mg/dL
Specific Gravity, Urine: 1.013 (ref 1.005–1.030)
pH: 6 (ref 5.0–8.0)

## 2019-09-15 LAB — CBC WITH DIFFERENTIAL/PLATELET
Abs Immature Granulocytes: 0 10*3/uL (ref 0.00–0.07)
Basophils Absolute: 0 10*3/uL (ref 0.0–0.1)
Basophils Relative: 0 %
Eosinophils Absolute: 0.1 10*3/uL (ref 0.0–0.5)
Eosinophils Relative: 2 %
HCT: 37.1 % (ref 36.0–46.0)
Hemoglobin: 12.5 g/dL (ref 12.0–15.0)
Immature Granulocytes: 0 %
Lymphocytes Relative: 42 %
Lymphs Abs: 1.7 10*3/uL (ref 0.7–4.0)
MCH: 27.4 pg (ref 26.0–34.0)
MCHC: 33.7 g/dL (ref 30.0–36.0)
MCV: 81.4 fL (ref 80.0–100.0)
Monocytes Absolute: 0.4 10*3/uL (ref 0.1–1.0)
Monocytes Relative: 10 %
Neutro Abs: 1.8 10*3/uL (ref 1.7–7.7)
Neutrophils Relative %: 46 %
Platelets: 244 10*3/uL (ref 150–400)
RBC: 4.56 MIL/uL (ref 3.87–5.11)
RDW: 12.1 % (ref 11.5–15.5)
WBC: 4 10*3/uL (ref 4.0–10.5)
nRBC: 0 % (ref 0.0–0.2)

## 2019-09-15 LAB — COMPREHENSIVE METABOLIC PANEL
ALT: 21 U/L (ref 0–44)
AST: 20 U/L (ref 15–41)
Albumin: 3.6 g/dL (ref 3.5–5.0)
Alkaline Phosphatase: 78 U/L (ref 38–126)
Anion gap: 11 (ref 5–15)
BUN: 13 mg/dL (ref 6–20)
CO2: 28 mmol/L (ref 22–32)
Calcium: 8.8 mg/dL — ABNORMAL LOW (ref 8.9–10.3)
Chloride: 96 mmol/L — ABNORMAL LOW (ref 98–111)
Creatinine, Ser: 0.78 mg/dL (ref 0.44–1.00)
GFR calc Af Amer: >60 mL/min (ref >60)
GFR calc non Af Amer: >60 mL/min (ref >60)
Glucose, Bld: 271 mg/dL — ABNORMAL HIGH (ref 70–99)
Potassium: 3.8 mmol/L (ref 3.5–5.1)
Sodium: 135 mmol/L (ref 135–145)
Total Bilirubin: 1 mg/dL (ref 0.3–1.2)
Total Protein: 7.2 g/dL (ref 6.5–8.1)

## 2019-09-15 LAB — PREGNANCY, URINE: Preg Test, Ur: NEGATIVE

## 2019-09-15 LAB — LIPASE, BLOOD: Lipase: 14 U/L (ref 11–51)

## 2019-09-15 MED ORDER — ONDANSETRON HCL 4 MG PO TABS: 4.0000 mg | ORAL_TABLET | Freq: Four times a day (QID) | ORAL | 0 refills | Status: DC

## 2019-09-15 MED ORDER — SODIUM CHLORIDE 0.9 % IV BOLUS
1000.0000 mL | Freq: Once | INTRAVENOUS | Status: AC
  Administered 2019-09-15: 11:00:00 1000 mL via INTRAVENOUS

## 2019-09-15 MED ORDER — ONDANSETRON HCL 4 MG/2ML IJ SOLN
4.0000 mg | Freq: Once | INTRAMUSCULAR | Status: AC
  Administered 2019-09-15: 4 mg via INTRAVENOUS
  Filled 2019-09-15: qty 2

## 2019-09-15 NOTE — ED Triage Notes (Signed)
Pt reports her stomach began hurting approx 7pm last night, then began vomiting until 4 am. Body feels achey

## 2019-09-15 NOTE — ED Provider Notes (Signed)
[facility] EMERGENCY DEPARTMENT Provider Note   CSN: [id number] Arrival date & time: 09/15/19  6226     History Chief Complaint  Patient presents with  . Abdominal Pain  . Emesis    Chrisie A Capurro is a 49 y.o. female.  HPI     Elani A Morrisette is a 49 y.o. female with past medical history of hypertension, type 2 diabetes, schizoaffective disorder, GERD, and hypothyroidism who presents to the Emergency Department complaining of sudden onset of nausea vomiting and loose stools.  She states that she developed diffuse crampy abdominal pain around 7 PM last evening while at work.  She left work early and woke at 4 AM with nausea and vomiting.  She also reports having some loose stools this morning.  States she has been unable to keep down any liquids this morning.  She has not taken her diabetes medications today due to her vomiting.  She states that she checked her blood sugars this morning and her level was in the 200s.  She describes having crampy abdominal pain that is constant but worsens just prior to vomiting.  She states that her granddaughter had similar symptoms earlier in the week and believes she may have "picked up a bug" from her.  She denies known Covid exposures.  No chest pain, fever, chills, dysuria, shortness of breath, hematemesis, melena or hematochezia.    Past Medical History:  Diagnosis Date  . Anemia   . Anxiety   . Asthma   . Depression   . GERD (gastroesophageal reflux disease)   . HLD (hyperlipidemia) 04/07/2019  . Hypertension   . Hypothyroidism, adult 04/07/2019  . Neuropathy   . Obesity (BMI 30.0-34.9) 04/07/2019  . Schizoaffective disorder ([place])   . Type II diabetes mellitus, uncontrolled ([place]) 04/27/2019    Patient Active Problem List   Diagnosis Date Noted  . Vitamin D deficiency 07/15/2019  . Type II diabetes mellitus, uncontrolled ([place]) 04/27/2019  . Hypothyroidism 04/07/2019  . Obesity (BMI 30.0-34.9) 04/07/2019  . Mixed hyperlipidemia  04/07/2019  . Abnormal esophagram 11/23/2018  . GERD (gastroesophageal reflux disease) 10/08/2015  . IBS (irritable colon syndrome) 10/08/2015  . Essential hypertension, benign 08/22/2015  . Schizoaffective disorder ([place]) 02/08/2015  . PTSD (post-traumatic stress disorder) 02/08/2015    Past Surgical History:  Procedure Laterality Date  . BREAST SURGERY Right    biopsy  . CESAREAN SECTION    . CHOLECYSTECTOMY  06/02/2012   Procedure: LAPAROSCOPIC CHOLECYSTECTOMY;  Surgeon: Jamesetta So, MD;  Location: [facility];  Service: General;  Laterality: N/A;  Attempted laparoscopic cholecystectomy  . CHOLECYSTECTOMY  06/02/2012   Procedure: CHOLECYSTECTOMY;  Surgeon: Jamesetta So, MD;  Location: [facility];  Service: General;  Laterality: N/A;  converted to open at  0905  . ESOPHAGEAL DILATION  12/31/2018   Procedure: ESOPHAGEAL DILATION;  Surgeon: Rogene Houston, MD;  Location: [facility];  Service: Endoscopy;;  . ESOPHAGOGASTRODUODENOSCOPY (EGD) WITH PROPOFOL N/A 08/24/2015   Procedure: ESOPHAGOGASTRODUODENOSCOPY (EGD) WITH PROPOFOL;  Surgeon: Rogene Houston, MD;  [facility]: [facility];  Service: Endoscopy;  Laterality: N/A;  1:10 - Ann to notify pt to arrive at 11:30  . ESOPHAGOGASTRODUODENOSCOPY (EGD) WITH PROPOFOL N/A 12/31/2018   Procedure: ESOPHAGOGASTRODUODENOSCOPY (EGD) WITH PROPOFOL;  Surgeon: Rogene Houston, MD;  [facility];  Service: Endoscopy;  Laterality: N/A;  . tooth removal  2019   all teeth removed  . TUBAL LIGATION     X3     OB  History    Gravida  3   Para  3   Term  3   Preterm      AB      Living  3     SAB      TAB      Ectopic      Multiple      Live Births              Family History  Problem Relation Age of Onset  . Hypertension Mother   . Alcohol abuse Mother   . Heart disease Mother   . Kidney disease Mother   . Hypertension Father   . Alcohol abuse Sister   . Stroke Other   . Diabetes Other   . Cancer Other   .  Seizures Other   . Alcohol abuse Maternal Aunt   . Alcohol abuse Paternal Aunt   . Alcohol abuse Maternal Grandfather   . Alcohol abuse Maternal Grandmother   . Alcohol abuse Cousin   . Hearing loss Daughter     Social History   Tobacco Use  . Smoking status: Never Smoker  . Smokeless tobacco: Never Used  Substance Use Topics  . Alcohol use: No    Alcohol/week: 0.0 standard drinks  . Drug use: No    Home Medications Prior to Admission medications   Medication Sig Start Date End Date Taking? Authorizing Provider  albuterol (PROVENTIL HFA;VENTOLIN HFA) 108 (90 Base) MCG/ACT inhaler Inhale 1-2 puffs into the lungs every 6 (six) hours as needed for wheezing or shortness of breath. 09/24/17   Joene Gelder, PA-C  aspirin EC 81 MG tablet Take 81 mg by mouth daily.    [provider]  atorvastatin (LIPITOR) 10 MG tablet Take 1 tablet (10 mg total) by mouth daily. 07/15/19   Ailene Ards, NP  benztropine (COGENTIN) 1 MG tablet Take 1 tablet (1 mg total) by mouth at bedtime. 09/05/19 09/04/20  Cloria Spring, MD  carvedilol (COREG) 12.5 MG tablet TAKE 1 TABLET BY MOUTH TWICE DAILY 07/15/19 10/13/19  Ailene Ards, NP  Cholecalciferol (VITAMIN D-3) 125 MCG (5000 UT) TABS Take 2 tablets by mouth daily.    [provider]  dicyclomine (BENTYL) 10 MG capsule Take 1 capsule (10 mg total) by mouth 3 (three) times daily before meals. 07/15/19   Ailene Ards, NP  DULoxetine (CYMBALTA) 60 MG capsule TAKE 1 CAPSULE(60 MG) BY MOUTH TWICE DAILY 09/05/19   Cloria Spring, MD  Exenatide ER (BYDUREON BCISE) 2 MG/0.85ML AUIJ Inject 2 mg into the skin every Monday. 07/18/19 08/17/19  Ailene Ards, NP  famotidine (PEPCID) 20 MG tablet Take 1 tablet (20 mg total) by mouth 2 (two) times daily. 07/15/19   Ailene Ards, NP  FLOVENT HFA 44 MCG/ACT inhaler Inhale 2 puffs into the lungs 2 (two) times daily as needed (wheezing or shortness of breath).  09/07/18   [provider]  gabapentin  (NEURONTIN) 300 MG capsule TAKE 1 CAPSULE BY MOUTH THREE TIMES DAILY 07/10/19 08/09/19  Hurshel Party C, MD  glipiZIDE (GLUCOTROL XL) 5 MG 24 hr tablet Take 1 tablet (5 mg total) by mouth daily with breakfast. 08/01/19   [name], Marella Chimes, MD  Insulin Glargine, 2 Unit Dial, (TOUJEO MAX SOLOSTAR) 300 UNIT/ML SOPN Inject 100 Units into the skin daily.    [provider]  insulin lispro (HUMALOG KWIKPEN) 100 UNIT/ML KwikPen Inject 0.1-0.16 mLs (10-16 Units total) into the skin 3 (three) times  daily. 08/17/19   Doree Albee, MD  levothyroxine (SYNTHROID) 75 MCG tablet Take 1 tablet (75 mcg total) by mouth daily before breakfast. 08/01/19   Cassandria Anger, MD  LINZESS 145 MCG CAPS capsule TAKE 1 CAPSULE BY MOUTH DAILY 07/30/19 10/28/19  Hurshel Party C, MD  losartan (COZAAR) 50 MG tablet TAKE 1 TABLET BY MOUTH DAILY 07/08/19   Hurshel Party C, MD  omeprazole (PRILOSEC) 20 MG capsule Take 1 capsule (20 mg total) by mouth daily. 07/15/19   Ailene Ards, NP  risperiDONE (RISPERDAL) 2 MG tablet TAKE 1 TABLET BY MOUTH EVERY MORNING AND 3 TABLETS BY MOUTH EVERY NIGHT AT BEDTIME 09/05/19   Cloria Spring, MD  traZODone (DESYREL) 100 MG tablet Take 2 tablets (200 mg total) by mouth at bedtime. 09/05/19   Cloria Spring, MD  BYDUREON BCISE 2 MG/0.85ML AUIJ Inject 2 mg into the skin every Monday.  08/12/16   [provider]  carvedilol (COREG) 12.5 MG tablet Take 12.5 mg by mouth 2 (two) times daily with a meal.     [provider]  gabapentin (NEURONTIN) 300 MG capsule Take 300 mg by mouth 3 (three) times daily.  03/28/15   [provider]  gabapentin (NEURONTIN) 300 MG capsule TAKE 1 CAPSULE BY MOUTH THREE TIMES DAILY 03/11/19   Gosrani, Nimish C, MD  JANUVIA 100 MG tablet Take 100 mg by mouth daily. 10/28/18   [provider]  LINZESS 145 MCG CAPS capsule Take 145 mcg by mouth daily before breakfast.  02/04/17   [provider]  losartan (COZAAR) 25 MG  tablet Take 25 mg daily by mouth.    [provider]  losartan (COZAAR) 50 MG tablet Take 1 tablet (50 mg total) by mouth daily. 04/27/19   Doree Albee, MD  omeprazole (PRILOSEC) 20 MG capsule Take 1 capsule (20 mg total) by mouth daily before supper. 04/08/16   Rogene Houston, MD    Allergies    Patient has no known allergies.  Review of Systems   Review of Systems  Constitutional: Negative for appetite change, chills and fever.  Respiratory: Negative for shortness of breath.   Cardiovascular: Negative for chest pain.  Gastrointestinal: Positive for abdominal pain, diarrhea, nausea and vomiting. Negative for blood in stool.  Genitourinary: Negative for decreased urine volume, difficulty urinating, dysuria and flank pain.  Musculoskeletal: Negative for back pain and myalgias.  Skin: Negative for color change and rash.  Neurological: Negative for dizziness, weakness, numbness and headaches.  Hematological: Negative for adenopathy.    Physical Exam Updated Vital Signs BP (!) 146/101 (BP Location: Right Arm)   Temp 99.3 F (37.4 C) (Oral)   Resp 18   Ht _0  (1.575 m)   Wt 76.2 kg   LMP 01/23/2013   SpO2 97%   BMI 30.73 kg/m   Physical Exam Vitals and nursing note reviewed.  Constitutional:      General: She is not in acute distress.    Appearance: She is well-developed.  HENT:     Mouth/Throat:     Mouth: Mucous membranes are dry.     Comments: Mucous membranes are slightly dry. Cardiovascular:     Rate and Rhythm: Normal rate and regular rhythm.     Pulses: Normal pulses.  Pulmonary:     Effort: Pulmonary effort is normal.  Chest:     Chest wall: No tenderness.  Abdominal:     General: There is no distension.  Palpations: Abdomen is soft. There is no mass.     Tenderness: There is no right CVA tenderness, left CVA tenderness or guarding.     Comments: Abdomen is soft, non-tender.  No distention, guarding, or rebound tenderness.     Musculoskeletal:     Cervical back: Normal range of motion.     Right lower leg: No edema.     Left lower leg: No edema.  Lymphadenopathy:     Cervical: No cervical adenopathy.  Skin:    General: Skin is warm.     Findings: No rash.  Neurological:     General: No focal deficit present.     Mental Status: She is alert.     Sensory: No sensory deficit.     Motor: No weakness.     ED Results / Procedures / Treatments   Labs (all labs ordered are listed, but only abnormal results are displayed) Labs Reviewed  COMPREHENSIVE METABOLIC PANEL - Abnormal; Notable for the following components:      Result Value   Chloride 96 (*)    Glucose, Bld 271 (*)    Calcium 8.8 (*)    All other components within normal limits  URINALYSIS, ROUTINE W REFLEX MICROSCOPIC - Abnormal; Notable for the following components:   Glucose, UA >=500 (*)    All other components within normal limits  LIPASE, BLOOD  CBC WITH DIFFERENTIAL/PLATELET  PREGNANCY, URINE    EKG None  Radiology No results found.  Procedures Procedures (including critical care time)  Medications Ordered in ED Medications  sodium chloride 0.9 % bolus 1,000 mL (1,000 mLs Intravenous New Bag/Given 09/15/19 1032)  ondansetron (ZOFRAN) injection 4 mg (4 mg Intravenous Given 09/15/19 1032)    ED Course  I have reviewed the triage vital signs and the nursing notes.  Pertinent labs & imaging results that were available during my care of the patient were reviewed by me and considered in my medical decision making (see chart for details).    MDM Rules/Calculators/A&P                      Patient is type II diabetic here with multiple episodes of vomiting, nausea, and loose stools.  She appears well and nontoxic.  States that blood sugars were in the 200s this morning.  Clinically, she does not appear dehydrated.  no significant abdominal tenderness on exam.  She does endorse recent sick contacts earlier this week.  Symptoms are  most likely viral.  Doubt acute abdomen.  Pt is a diabetic so I will give IV saline, antiemetic and obtain labs.   On recheck, pt is feeling better after antiemetic and IVF's.  No further vomiting during ER stay.  Abdomen remains soft and nontender.  No leukocytosis, significant fever or respiratory symptoms.  C-Met shows blood sugar of 271 with bicarb and anion gap within normal limits.  No hypokalemia.  Patient does admit that she did not take her diabetes medication this morning due to her vomiting.  Will try oral fluid challenge  On recheck after oral fluids, patient has tolerated well.  No further vomiting.  She states she is ready for discharge home.  I feel this is appropriate.  I have advised her to take her diabetes medication when she returns home.  She agrees to plan.  Strict return precautions discussed.  Final Clinical Impression(s) / ED Diagnoses Final diagnoses:  Nausea vomiting and diarrhea    Rx / DC Orders ED Discharge Orders  None       Kem Parkinson, PA-C 09/15/19 1421    Long, Wonda Olds, MD 09/16/19 [id number]

## 2019-09-15 NOTE — Discharge Instructions (Signed)
Small, frequent sips of clear fluids today, then bland diet as tolerated.  Be sure to take your diabetes medications when you return home.  Follow-up with your primary doctor for recheck.  Return to the emergency department if you develop any worsening symptoms such as abdominal pain, fever, or persistent vomiting

## 2019-10-08 ENCOUNTER — Other Ambulatory Visit (INDEPENDENT_AMBULATORY_CARE_PROVIDER_SITE_OTHER): Payer: Self-pay | Admitting: Internal Medicine

## 2019-10-12 ENCOUNTER — Ambulatory Visit (INDEPENDENT_AMBULATORY_CARE_PROVIDER_SITE_OTHER): Payer: Medicare Other | Admitting: Internal Medicine

## 2019-10-12 ENCOUNTER — Other Ambulatory Visit (INDEPENDENT_AMBULATORY_CARE_PROVIDER_SITE_OTHER): Payer: Self-pay | Admitting: Nurse Practitioner

## 2019-10-12 ENCOUNTER — Encounter (INDEPENDENT_AMBULATORY_CARE_PROVIDER_SITE_OTHER): Payer: Self-pay | Admitting: Internal Medicine

## 2019-10-12 ENCOUNTER — Other Ambulatory Visit: Payer: Self-pay

## 2019-10-12 VITALS — BP 120/90 | HR 104 | Temp 97.0°F | Ht 62.0 in | Wt 170.4 lb

## 2019-10-12 DIAGNOSIS — E039 Hypothyroidism, unspecified: Secondary | ICD-10-CM | POA: Diagnosis not present

## 2019-10-12 DIAGNOSIS — I10 Essential (primary) hypertension: Secondary | ICD-10-CM

## 2019-10-12 DIAGNOSIS — E782 Mixed hyperlipidemia: Secondary | ICD-10-CM | POA: Diagnosis not present

## 2019-10-12 NOTE — Progress Notes (Signed)
Metrics: Intervention Frequency ACO  Documented Smoking Status Yearly  Screened one or more times in 24 months  Cessation Counseling or  Active cessation medication Past 24 months  Past 24 months   Guideline developer: UpToDate (See UpToDate for funding source) Date Released: 2014       Wellness Office Visit  Subjective:  Patient ID: Kathryn [name], female    DOB: Oct 13, 1970  Age: 49 y.o. MRN: [id number]  CC: This lady comes in for follow-up of hypertension, hypothyroidism, obesity, diabetes. HPI  She is now seeing Dr. Dorris Fetch for her type 2 diabetes and is on insulin. She continues on statin therapy without any problems.  She denies any myalgia. She continues on levothyroxine for hypothyroidism. She continues on carvedilol for hypertension. She has no other significant complaints today. Past Medical History:  Diagnosis Date  . Anemia   . Anxiety   . Asthma   . Depression   . GERD (gastroesophageal reflux disease)   . HLD (hyperlipidemia) 04/07/2019  . Hypertension   . Hypothyroidism, adult 04/07/2019  . Neuropathy   . Obesity (BMI 30.0-34.9) 04/07/2019  . Schizoaffective disorder ([place])   . Type II diabetes mellitus, uncontrolled ([place]) 04/27/2019      Family History  Problem Relation Age of Onset  . Hypertension Mother   . Alcohol abuse Mother   . Heart disease Mother   . Kidney disease Mother   . Hypertension Father   . Alcohol abuse Sister   . Stroke Other   . Diabetes Other   . Cancer Other   . Seizures Other   . Alcohol abuse Maternal Aunt   . Alcohol abuse Paternal Aunt   . Alcohol abuse Maternal Grandfather   . Alcohol abuse Maternal Grandmother   . Alcohol abuse Cousin   . Hearing loss Daughter     Social History   Social History Narrative   Married for 22 years.On disability secondary to schizophrenia.Lives with husband.   Social History   Tobacco Use  . Smoking status: Never Smoker  . Smokeless tobacco: Never Used  Substance Use Topics  .  Alcohol use: No    Alcohol/week: 0.0 standard drinks    Current Meds  Medication Sig  . aspirin EC 81 MG tablet Take 81 mg by mouth daily.  Marland Kitchen atorvastatin (LIPITOR) 10 MG tablet TAKE 1 TABLET(10 MG) BY MOUTH DAILY  . benztropine (COGENTIN) 1 MG tablet Take 1 tablet (1 mg total) by mouth at bedtime.  . carvedilol (COREG) 12.5 MG tablet TAKE 1 TABLET BY MOUTH TWICE DAILY  . Cholecalciferol (VITAMIN D-3) 125 MCG (5000 UT) TABS Take 2 tablets by mouth daily.  Marland Kitchen dicyclomine (BENTYL) 10 MG capsule Take 1 capsule (10 mg total) by mouth 3 (three) times daily before meals.  . DULoxetine (CYMBALTA) 60 MG capsule TAKE 1 CAPSULE(60 MG) BY MOUTH TWICE DAILY (Patient taking differently: Take 60 mg by mouth 2 (two) times daily. TAKE 1 CAPSULE(60 MG) BY MOUTH TWICE DAILY)  . Exenatide ER (BYDUREON BCISE) 2 MG/0.85ML AUIJ Inject 2 mg into the skin every Monday.  . famotidine (PEPCID) 20 MG tablet Take 1 tablet (20 mg total) by mouth 2 (two) times daily.  Marland Kitchen FLOVENT HFA 44 MCG/ACT inhaler Inhale 2 puffs into the lungs 2 (two) times daily as needed (wheezing or shortness of breath).   . gabapentin (NEURONTIN) 300 MG capsule TAKE 1 CAPSULE BY MOUTH THREE TIMES DAILY (Patient taking differently: Take 300 mg by mouth 3 (three) times daily. )  .  glipiZIDE (GLUCOTROL XL) 5 MG 24 hr tablet Take 1 tablet (5 mg total) by mouth daily with breakfast.  . Insulin Glargine, 2 Unit Dial, (TOUJEO MAX SOLOSTAR) 300 UNIT/ML SOPN Inject 100 Units into the skin daily.  . insulin lispro (HUMALOG KWIKPEN) 100 UNIT/ML KwikPen Inject 0.1-0.16 mLs (10-16 Units total) into the skin 3 (three) times daily.  Marland Kitchen levothyroxine (SYNTHROID) 75 MCG tablet Take 1 tablet (75 mcg total) by mouth daily before breakfast.  . LINZESS 145 MCG CAPS capsule TAKE 1 CAPSULE BY MOUTH DAILY (Patient taking differently: Take 145 mcg by mouth daily before breakfast. )  . losartan (COZAAR) 50 MG tablet TAKE 1 TABLET(50 MG) BY MOUTH DAILY  . ondansetron (ZOFRAN) 4  MG tablet Take 1 tablet (4 mg total) by mouth every 6 (six) hours. as needed for nausea or vomiting.  . risperiDONE (RISPERDAL) 2 MG tablet TAKE 1 TABLET BY MOUTH EVERY MORNING AND 3 TABLETS BY MOUTH EVERY NIGHT AT BEDTIME (Patient taking differently: Take 2-6 mg by mouth 2 (two) times daily. TAKE 1 TABLET BY MOUTH EVERY MORNING AND 3 TABLETS BY MOUTH EVERY NIGHT AT BEDTIME)  . traZODone (DESYREL) 100 MG tablet Take 2 tablets (200 mg total) by mouth at bedtime.        Objective:   Today's Vitals: BP 120/90 (BP Location: Right Arm, Patient Position: Sitting, Cuff Size: Normal)   Pulse (!) 104   Temp (!) 97 F (36.1 C) (Temporal)   Ht 5\' 2"  (1.575 m)   Wt 170 lb 6.4 oz (77.3 kg)   LMP 01/23/2013   SpO2 97%   BMI 31.17 kg/m  Vitals with BMI 10/12/2019 09/15/2019 09/15/2019  Height 5\' 2"  - -  Weight 170 lbs 6 oz - -  BMI [id number] - -  Systolic [id number] - [id number]  Diastolic 90 - 87  Pulse [id number] 89 -     Physical Exam   She remains obese and in fact has gained weight since the last time I saw her in December.  Blood pressure borderline control.  Alert and orientated without any obvious focal neurological signs.    Assessment   1. Essential hypertension   2. Hypothyroidism, adult   3. Mixed hyperlipidemia       Tests ordered No orders of the defined types were placed in this encounter.    Plan: 1. She will continue with antihypertensive therapy and I think that if she were to lose weight, her blood pressure would improve and I am not very keen to add further antihypertensives at this stage. 2. She will continue with levothyroxine as before. 3. She will continue with statin therapy which she seems to tolerate well.  As far as her diabetes is concerned, Dr. Dorris Fetch is managing this. 4. She will follow-up with Judson Roch in about 3 months time.   No orders of the defined types were placed in this encounter.   Doree Albee, MD

## 2019-10-13 ENCOUNTER — Encounter: Payer: Self-pay | Admitting: "Endocrinology

## 2019-10-13 ENCOUNTER — Other Ambulatory Visit (INDEPENDENT_AMBULATORY_CARE_PROVIDER_SITE_OTHER): Payer: Self-pay | Admitting: Nurse Practitioner

## 2019-10-13 ENCOUNTER — Ambulatory Visit (INDEPENDENT_AMBULATORY_CARE_PROVIDER_SITE_OTHER): Payer: Medicare Other | Admitting: "Endocrinology

## 2019-10-13 ENCOUNTER — Encounter: Payer: Self-pay | Admitting: Nutrition

## 2019-10-13 ENCOUNTER — Encounter: Payer: Medicare Other | Attending: "Endocrinology | Admitting: Nutrition

## 2019-10-13 VITALS — Ht 62.0 in | Wt 171.2 lb

## 2019-10-13 VITALS — BP 155/99 | HR 84 | Ht 62.0 in | Wt 171.2 lb

## 2019-10-13 DIAGNOSIS — E66811 Obesity, class 1: Secondary | ICD-10-CM

## 2019-10-13 DIAGNOSIS — I10 Essential (primary) hypertension: Secondary | ICD-10-CM

## 2019-10-13 DIAGNOSIS — E669 Obesity, unspecified: Secondary | ICD-10-CM | POA: Diagnosis present

## 2019-10-13 DIAGNOSIS — E119 Type 2 diabetes mellitus without complications: Secondary | ICD-10-CM

## 2019-10-13 DIAGNOSIS — E1165 Type 2 diabetes mellitus with hyperglycemia: Secondary | ICD-10-CM | POA: Diagnosis present

## 2019-10-13 DIAGNOSIS — E782 Mixed hyperlipidemia: Secondary | ICD-10-CM

## 2019-10-13 DIAGNOSIS — E039 Hypothyroidism, unspecified: Secondary | ICD-10-CM

## 2019-10-13 MED ORDER — LEVOTHYROXINE SODIUM 75 MCG PO TABS: 75.0000 ug | ORAL_TABLET | Freq: Every day | ORAL | 1 refills | Status: DC

## 2019-10-13 MED ORDER — BYDUREON BCISE 2 MG/0.85ML ~~LOC~~ AUIJ: 2.0000 mg | AUTO-INJECTOR | SUBCUTANEOUS | 2 refills | Status: DC

## 2019-10-13 MED ORDER — VITAMIN D-3 125 MCG (5000 UT) PO TABS: 2.0000 | ORAL_TABLET | Freq: Every day | ORAL | 1 refills | Status: AC

## 2019-10-13 MED ORDER — ACCU-CHEK GUIDE VI STRP: ORAL_STRIP | 2 refills | Status: DC

## 2019-10-13 MED ORDER — TOUJEO MAX SOLOSTAR 300 UNIT/ML ~~LOC~~ SOPN: 80.0000 [IU] | PEN_INJECTOR | Freq: Every day | SUBCUTANEOUS | 2 refills | Status: DC

## 2019-10-13 MED ORDER — GLIPIZIDE ER 5 MG PO TB24: 5.0000 mg | ORAL_TABLET | Freq: Every day | ORAL | 1 refills | Status: DC

## 2019-10-13 MED ORDER — ATORVASTATIN CALCIUM 10 MG PO TABS: ORAL_TABLET | ORAL | 1 refills | Status: DC

## 2019-10-13 MED ORDER — CARVEDILOL 12.5 MG PO TABS: 12.5000 mg | ORAL_TABLET | Freq: Two times a day (BID) | ORAL | 1 refills | Status: DC

## 2019-10-13 MED ORDER — LOSARTAN POTASSIUM 50 MG PO TABS: ORAL_TABLET | ORAL | 1 refills | Status: DC

## 2019-10-13 NOTE — Patient Instructions (Signed)

## 2019-10-13 NOTE — Progress Notes (Signed)
  Medical Nutrition Therapy:  Appt start time: 900  end time:  0930 Assessment:  Primary concerns today: Diabetes Type 2. Has been walking a lot more. Has cut back on bread. Cut out sodas. Drinking only water. Eating more fruits and vegetables.  FBS: 200-250's  Before lunch170-180's and before dinner: 160-170's Toujeo max 80 units and was on Humalog 15 units plus sliding scale.. Saw Dr. Dorris Fetch today. She will resume her bydureon, and Glipizide 5 mg daily. Will stop  Humalog with meals for the next week to see what blood sugars are with the changes.  Feels a lot better. BS are improving. Eating much better quality of foods and watching portions.   CMP Latest Ref Rng & Units 09/15/2019 07/19/2019 04/07/2019  Glucose 70 - 99 mg/dL 271(H) 478(H) 211(H)  BUN 6 - 20 mg/dL 13 15 17   Creatinine 0.44 - 1.00 mg/dL 0.78 1.01 1.14(H)  Sodium 135 - 145 mmol/L 135 131(L) 139  Potassium 3.5 - 5.1 mmol/L 3.8 4.1 3.9  Chloride 98 - 111 mmol/L 96(L) 93(L) 101  CO2 22 - 32 mmol/L 28 28 27   Calcium 8.9 - 10.3 mg/dL 8.8(L) 10.0 9.5  Total Protein 6.5 - 8.1 g/dL 7.2 7.8 6.8  Total Bilirubin 0.3 - 1.2 mg/dL 1.0 0.8 0.6  Alkaline Phos 38 - 126 U/L 78 - -  AST 15 - 41 U/L 20 9(L) 11  ALT 0 - 44 U/L 21 12 17    Lab Results  Component Value Date   HGBA1C >14.0 (H) 07/19/2019   Preferred Learning Style:     No preference indicated   Learning Readiness:   Ready  Change in progress   MEDICATIONS:    DIETARY INTAKE: B) 2 boiled eggs, 1/2 bagel and 1 cup 1% milk.  L) ground [place] meat, wheat noodles and alfredo sauce, broccoli and cheese, water D) [place], sweet potato, green beans, 1 cup milk.  Usual physical activity: walks  Estimated energy needs: 1200 calories 135 g carbohydrates 90 g protein 33 g fat  Progress Towards Goal(s):  In progress.   Nutritional Diagnosis:  NB-1.1 Food and nutrition-related knowledge deficit As related to Diabetes Type 2.  As evidenced by A1C > 14%.     Intervention: Nutrition and Diabetes education provided on My Plate, CHO counting, meal planning, portion sizes, timing of meals, avoiding snacks between meals unless having a low blood sugar, target ranges for A1C and blood sugars, signs/symptoms and treatment of hyper/hypoglycemia, monitoring blood sugars, taking medications as prescribed, benefits of exercising 30 minutes per day and prevention of complications of DM. Marland Kitchen Goals Keep up the good job! Eat three meals a day Increase walking and  Water intake. Increase lower carb vegetables.  Eat 30-45 grams of carbs per meal Watch [employer] cooking videos for meal ideas. Get A1C to 7%  Teaching Method Utilized:  Visual Auditory Hands on  Handouts given during visit include:  The Plate Mthod   Meal Plan Card  Diabetes Instructions.     Barriers to learning/adherence to lifestyle change: mental health  Demonstrated degree of understanding via:  Teach Back   Monitoring/Evaluation:  Dietary intake, exercise, , and body weight in 2 weeks (s).
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
## 2019-10-13 NOTE — Patient Instructions (Signed)
Goals Keep up the good job! Eat three meals a day Increase walking and  Water intake. Increase lower carb vegetables.  Eat 30-45 grams of carbs per meal Watch [employer] cooking videos for meal ideas. Get A1C to 7%

## 2019-10-19 ENCOUNTER — Ambulatory Visit: Payer: Medicare Other | Admitting: Orthopedic Surgery

## 2019-10-25 ENCOUNTER — Other Ambulatory Visit: Payer: Self-pay

## 2019-10-25 ENCOUNTER — Encounter: Payer: Self-pay | Admitting: "Endocrinology

## 2019-10-25 ENCOUNTER — Ambulatory Visit (INDEPENDENT_AMBULATORY_CARE_PROVIDER_SITE_OTHER): Payer: Medicare Other | Admitting: "Endocrinology

## 2019-10-25 VITALS — BP 174/99 | HR 85 | Ht 62.0 in | Wt 174.8 lb

## 2019-10-25 DIAGNOSIS — E1165 Type 2 diabetes mellitus with hyperglycemia: Secondary | ICD-10-CM | POA: Diagnosis not present

## 2019-10-25 DIAGNOSIS — E782 Mixed hyperlipidemia: Secondary | ICD-10-CM | POA: Diagnosis not present

## 2019-10-25 DIAGNOSIS — I10 Essential (primary) hypertension: Secondary | ICD-10-CM | POA: Diagnosis not present

## 2019-10-25 DIAGNOSIS — E039 Hypothyroidism, unspecified: Secondary | ICD-10-CM

## 2019-10-25 LAB — POCT GLYCOSYLATED HEMOGLOBIN (HGB A1C): Hemoglobin A1C: 10.9 % — AB (ref 4.0–5.6)

## 2019-10-25 MED ORDER — GLIPIZIDE ER 5 MG PO TB24: 10.0000 mg | ORAL_TABLET | Freq: Every day | ORAL | 1 refills | Status: DC

## 2019-10-25 NOTE — Progress Notes (Signed)
10/25/2019, 9:30 AM  Endocrinology follow-up note   Subjective:    Patient ID: Kathryn [name], female    DOB: 10/20/1970.  Kathryn [name] is being seen in follow-up after she was seen in consultation for management of currently uncontrolled symptomatic diabetes requested by  Ailene Ards, NP.   Past Medical History:  Diagnosis Date  . Anemia   . Anxiety   . Asthma   . Depression   . GERD (gastroesophageal reflux disease)   . HLD (hyperlipidemia) 04/07/2019  . Hypertension   . Hypothyroidism, adult 04/07/2019  . Neuropathy   . Obesity (BMI 30.0-34.9) 04/07/2019  . Schizoaffective disorder ([place])   . Type II diabetes mellitus, uncontrolled ([place]) 04/27/2019    Past Surgical History:  Procedure Laterality Date  . BREAST SURGERY Right    biopsy  . CESAREAN SECTION    . CHOLECYSTECTOMY  06/02/2012   Procedure: LAPAROSCOPIC CHOLECYSTECTOMY;  Surgeon: Jamesetta So, MD;  Location: [facility];  Service: General;  Laterality: N/A;  Attempted laparoscopic cholecystectomy  . CHOLECYSTECTOMY  06/02/2012   Procedure: CHOLECYSTECTOMY;  Surgeon: Jamesetta So, MD;  Location: [facility];  Service: General;  Laterality: N/A;  converted to open at  0905  . ESOPHAGEAL DILATION  12/31/2018   Procedure: ESOPHAGEAL DILATION;  Surgeon: Rogene Houston, MD;  Location: [facility];  Service: Endoscopy;;  . ESOPHAGOGASTRODUODENOSCOPY (EGD) WITH PROPOFOL N/A 08/24/2015   Procedure: ESOPHAGOGASTRODUODENOSCOPY (EGD) WITH PROPOFOL;  Surgeon: Rogene Houston, MD;  Location: [facility];  Service: Endoscopy;  Laterality: N/A;  1:10 - Ann to notify pt to arrive at 11:30  . ESOPHAGOGASTRODUODENOSCOPY (EGD) WITH PROPOFOL N/A 12/31/2018   Procedure: ESOPHAGOGASTRODUODENOSCOPY (EGD) WITH PROPOFOL;  Surgeon: Rogene Houston, MD;  Location: [facility];  Service: Endoscopy;  Laterality: N/A;  . tooth removal  2019   all teeth removed  . TUBAL LIGATION      X3    Social History   Socioeconomic History  . Marital status: Married    Spouse name: Not on file  . Number of children: Not on file  . Years of education: Not on file  . Highest education level: Not on file  Occupational History  . Not on file  Tobacco Use  . Smoking status: Never Smoker  . Smokeless tobacco: Never Used  Substance and Sexual Activity  . Alcohol use: No    Alcohol/week: 0.0 standard drinks  . Drug use: No  . Sexual activity: Not Currently    Birth control/protection: Surgical  Other Topics Concern  . Not on file  Social History Narrative   Married for 22 years.On disability secondary to schizophrenia.Lives with husband.   Social Determinants of Health   Financial Resource Strain:   . Difficulty of Paying Living Expenses:   Food Insecurity:   . Worried About Charity fundraiser in the Last Year:   . Arboriculturist in the Last Year:   Transportation Needs:   . Film/video editor (Medical):   Marland Kitchen Lack of Transportation (Non-Medical):   Physical Activity:   . Days of Exercise per Week:   . Minutes of Exercise per Session:   Stress:   . Feeling of  Stress :   Social Connections:   . Frequency of Communication with Friends and Family:   . Frequency of Social Gatherings with Friends and Family:   . Attends Religious Services:   . Active Member of Clubs or Organizations:   . Attends Archivist Meetings:   Marland Kitchen Marital Status:     Family History  Problem Relation Age of Onset  . Hypertension Mother   . Alcohol abuse Mother   . Heart disease Mother   . Kidney disease Mother   . Hypertension Father   . Alcohol abuse Sister   . Stroke Other   . Diabetes Other   . Cancer Other   . Seizures Other   . Alcohol abuse Maternal Aunt   . Alcohol abuse Paternal Aunt   . Alcohol abuse Maternal Grandfather   . Alcohol abuse Maternal Grandmother   . Alcohol abuse Cousin   . Hearing loss Daughter     Outpatient Encounter Medications as of  10/25/2019  Medication Sig  . [DISCONTINUED] insulin lispro (HUMALOG) 100 UNIT/ML injection Inject into the skin 3 (three) times daily before meals.  Marland Kitchen aspirin EC 81 MG tablet Take 81 mg by mouth daily.  Marland Kitchen atorvastatin (LIPITOR) 10 MG tablet TAKE 1 TABLET(10 MG) BY MOUTH DAILY  . benztropine (COGENTIN) 1 MG tablet Take 1 tablet (1 mg total) by mouth at bedtime.  . carvedilol (COREG) 12.5 MG tablet Take 1 tablet (12.5 mg total) by mouth 2 (two) times daily.  . Cholecalciferol (VITAMIN D-3) 125 MCG (5000 UT) TABS Take 2 tablets by mouth daily.  Marland Kitchen dicyclomine (BENTYL) 10 MG capsule Take 1 capsule (10 mg total) by mouth 3 (three) times daily before meals.  . DULoxetine (CYMBALTA) 60 MG capsule TAKE 1 CAPSULE(60 MG) BY MOUTH TWICE DAILY (Patient taking differently: Take 60 mg by mouth 2 (two) times daily. TAKE 1 CAPSULE(60 MG) BY MOUTH TWICE DAILY)  . Exenatide ER (BYDUREON BCISE) 2 MG/0.85ML AUIJ Inject 2 mg into the skin every Monday.  . famotidine (PEPCID) 20 MG tablet Take 1 tablet (20 mg total) by mouth 2 (two) times daily.  Marland Kitchen FLOVENT HFA 44 MCG/ACT inhaler Inhale 2 puffs into the lungs 2 (two) times daily as needed (wheezing or shortness of breath).   . gabapentin (NEURONTIN) 300 MG capsule TAKE 1 CAPSULE BY MOUTH THREE TIMES DAILY (Patient taking differently: Take 300 mg by mouth 3 (three) times daily. )  . glipiZIDE (GLUCOTROL XL) 5 MG 24 hr tablet Take 2 tablets (10 mg total) by mouth daily with breakfast.  . glucose blood (ACCU-CHEK GUIDE) test strip Use as instructed  . insulin glargine, 2 Unit Dial, (TOUJEO MAX SOLOSTAR) 300 UNIT/ML Solostar Pen Inject 80 Units into the skin daily.  Marland Kitchen levothyroxine (SYNTHROID) 75 MCG tablet Take 1 tablet (75 mcg total) by mouth daily before breakfast.  . LINZESS 145 MCG CAPS capsule TAKE 1 CAPSULE BY MOUTH DAILY (Patient taking differently: Take 145 mcg by mouth daily before breakfast. )  . losartan (COZAAR) 50 MG tablet TAKE 1 TABLET(50 MG) BY MOUTH DAILY   . omeprazole (PRILOSEC) 20 MG capsule TAKE 1 CAPSULE BY MOUTH EVERY DAY  . ondansetron (ZOFRAN) 4 MG tablet Take 1 tablet (4 mg total) by mouth every 6 (six) hours. as needed for nausea or vomiting.  . risperiDONE (RISPERDAL) 2 MG tablet TAKE 1 TABLET BY MOUTH EVERY MORNING AND 3 TABLETS BY MOUTH EVERY NIGHT AT BEDTIME (Patient taking differently: Take 2-6 mg by mouth 2 (two)  times daily. TAKE 1 TABLET BY MOUTH EVERY MORNING AND 3 TABLETS BY MOUTH EVERY NIGHT AT BEDTIME)  . traZODone (DESYREL) 100 MG tablet Take 2 tablets (200 mg total) by mouth at bedtime.  . [DISCONTINUED] atorvastatin (LIPITOR) 10 MG tablet Take 1 tablet (10 mg total) by mouth daily.  . [DISCONTINUED] BYDUREON BCISE 2 MG/0.85ML AUIJ Inject 2 mg into the skin every Monday.   . [DISCONTINUED] carvedilol (COREG) 12.5 MG tablet Take 12.5 mg by mouth 2 (two) times daily with a meal.   . [DISCONTINUED] carvedilol (COREG) 12.5 MG tablet TAKE 1 TABLET BY MOUTH TWICE DAILY (Patient taking differently: Take 12.5 mg by mouth in the morning and at bedtime. )  . [DISCONTINUED] gabapentin (NEURONTIN) 300 MG capsule Take 300 mg by mouth 3 (three) times daily.   . [DISCONTINUED] gabapentin (NEURONTIN) 300 MG capsule TAKE 1 CAPSULE BY MOUTH THREE TIMES DAILY  . [DISCONTINUED] glipiZIDE (GLUCOTROL XL) 5 MG 24 hr tablet Take 1 tablet (5 mg total) by mouth daily with breakfast.  . [DISCONTINUED] JANUVIA 100 MG tablet Take 100 mg by mouth daily.  . [DISCONTINUED] LINZESS 145 MCG CAPS capsule Take 145 mcg by mouth daily before breakfast.   . [DISCONTINUED] losartan (COZAAR) 25 MG tablet Take 25 mg daily by mouth.  . [DISCONTINUED] losartan (COZAAR) 50 MG tablet Take 1 tablet (50 mg total) by mouth daily.  . [DISCONTINUED] losartan (COZAAR) 50 MG tablet TAKE 1 TABLET BY MOUTH DAILY (Patient taking differently: Take 50 mg by mouth daily. )  . [DISCONTINUED] omeprazole (PRILOSEC) 20 MG capsule Take 1 capsule (20 mg total) by mouth daily before supper.   . [DISCONTINUED] omeprazole (PRILOSEC) 20 MG capsule TAKE 1 CAPSULE BY MOUTH EVERY DAY   No facility-administered encounter medications on file as of 10/25/2019.    ALLERGIES: No Known Allergies  VACCINATION STATUS: Immunization History  Administered Date(s) Administered  . Influenza Split 06/03/2012  . Influenza,inj,Quad PF,6+ Mos 04/07/2019  . Moderna SARS-COVID-2 Vaccination 09/02/2019, 09/30/2019  . Pneumococcal Polysaccharide-23 06/03/2012  . Tdap 05/24/2014    Diabetes She presents for her follow-up diabetic visit. She has type 2 diabetes mellitus. Onset time: She was diagnosed at approximate age of 80 years. Her disease course has been improving (Patient was previously seen in this practice however disappeared from care for unclear reasons.). There are no hypoglycemic associated symptoms. Pertinent negatives for hypoglycemia include no confusion, headaches, pallor or seizures. Associated symptoms include fatigue, polydipsia and polyuria. Pertinent negatives for diabetes include no chest pain and no polyphagia. There are no hypoglycemic complications. Symptoms are improving. Diabetic complications include peripheral neuropathy. Risk factors for coronary artery disease include dyslipidemia, diabetes mellitus, hypertension, obesity and sedentary lifestyle. Current diabetic treatments: She is currently on Bydureon 2 mg weekly, Toujeo 132 units daily. Her weight is fluctuating minimally. She is following a generally unhealthy diet. When asked about meal planning, she reported none. She has not had a previous visit with a dietitian. She rarely participates in exercise. Her home blood glucose trend is decreasing steadily. Her breakfast blood glucose range is generally 140-180 mg/dl. Her lunch blood glucose range is generally 180-200 mg/dl. Her dinner blood glucose range is generally 180-200 mg/dl. Her bedtime blood glucose range is generally 180-200 mg/dl. Her overall blood glucose range is  180-200 mg/dl. (She presents with significant improvement in her glycemic profile.  Her point-of-care A1c is 10.9% improving from   >14% .  Her fasting glycemic profile is near target, no hypoglycemia.) An ACE inhibitor/angiotensin II receptor  blocker is being taken. Eye exam is current.  Hyperlipidemia This is a chronic problem. The current episode started more than 1 year ago. The problem is uncontrolled. Recent lipid tests were reviewed and are high. Exacerbating diseases include diabetes and obesity. Pertinent negatives include no chest pain, myalgias or shortness of breath. Current antihyperlipidemic treatment includes statins. Risk factors for coronary artery disease include family history, dyslipidemia, obesity, hypertension, diabetes mellitus and a sedentary lifestyle.  Hypertension This is a chronic problem. The current episode started more than 1 year ago. The problem is controlled. Pertinent negatives include no chest pain, headaches, palpitations or shortness of breath. Risk factors for coronary artery disease include dyslipidemia, diabetes mellitus, obesity, family history and sedentary lifestyle. Past treatments include angiotensin blockers. Identifiable causes of hypertension include a thyroid problem.  Thyroid Problem Presents for initial visit. Onset time: She was diagnosed at approximate age of 53 years. Symptoms include depressed mood and fatigue. Patient reports no cold intolerance, diarrhea, heat intolerance or palpitations. The symptoms have been worsening. Treatments tried: She is currently on natures Armour 90 mg p.o. daily. Her past medical history is significant for diabetes and hyperlipidemia.     Review of Systems  Constitutional: Positive for fatigue. Negative for chills, fever and unexpected weight change.  HENT: Negative for trouble swallowing and voice change.   Eyes: Negative for visual disturbance.  Respiratory: Negative for cough, shortness of breath and wheezing.    Cardiovascular: Negative for chest pain, palpitations and leg swelling.  Gastrointestinal: Negative for diarrhea, nausea and vomiting.  Endocrine: Positive for polydipsia and polyuria. Negative for cold intolerance, heat intolerance and polyphagia.  Musculoskeletal: Negative for arthralgias and myalgias.  Skin: Negative for color change, pallor, rash and wound.  Neurological: Negative for seizures and headaches.  Psychiatric/Behavioral: Negative for confusion and suicidal ideas.    Objective:    Vitals with BMI 10/25/2019 10/13/2019 10/13/2019  Height 5\' 2"  5\' 2"  5\' 2"   Weight 174 lbs 13 oz 171 lbs 3 oz 171 lbs 3 oz  BMI 31.96 [id number]  Systolic [id number]  Diastolic 99 - 99  Pulse 85 - 84  Some encounter information is confidential and restricted. Go to Review Flowsheets activity to see all data.    BP (!) 174/99   Pulse 85   Ht 5\' 2"  (1.575 m)   Wt 174 lb 12.8 oz (79.3 kg)   LMP 01/23/2013   BMI 31.97 kg/m   Wt Readings from Last 3 Encounters:  10/25/19 174 lb 12.8 oz (79.3 kg)  10/13/19 171 lb 3.2 oz (77.7 kg)  10/13/19 171 lb 3.2 oz (77.7 kg)     Physical Exam Constitutional:      Appearance: She is well-developed.  HENT:     Head: Normocephalic and atraumatic.  Neck:     Thyroid: No thyromegaly.     Trachea: No tracheal deviation.     Comments: She has palpable thyromegaly. Cardiovascular:     Rate and Rhythm: Normal rate and regular rhythm.  Pulmonary:     Effort: Pulmonary effort is normal.  Abdominal:     General: Abdomen is flat.     Tenderness: There is no abdominal tenderness. There is no guarding.  Musculoskeletal:        General: Normal range of motion.     Cervical back: Normal range of motion and neck supple.  Skin:    General: Skin is warm and dry.     Coloration: Skin is not pale.  Findings: No erythema or rash.  Neurological:     Mental Status: She is alert and oriented to person, place, and time.     Cranial Nerves: No cranial nerve  deficit.     Coordination: Coordination normal.     Deep Tendon Reflexes: Reflexes are normal and symmetric.  Psychiatric:        Judgment: Judgment normal.     CMP     Component Value Date/Time   NA 135 09/15/2019 1028   K 3.8 09/15/2019 1028   CL 96 (L) 09/15/2019 1028   CO2 28 09/15/2019 1028   GLUCOSE 271 (H) 09/15/2019 1028   BUN 13 09/15/2019 1028   CREATININE 0.78 09/15/2019 1028   CREATININE 1.01 07/19/2019 0938   CALCIUM 8.8 (L) 09/15/2019 1028   PROT 7.2 09/15/2019 1028   ALBUMIN 3.6 09/15/2019 1028   AST 20 09/15/2019 1028   ALT 21 09/15/2019 1028   ALKPHOS 78 09/15/2019 1028   BILITOT 1.0 09/15/2019 1028   GFRNONAA >60 09/15/2019 1028   GFRNONAA 66 07/19/2019 0938   GFRAA >60 09/15/2019 1028   GFRAA 76 07/19/2019 0938     Diabetic Labs (most recent): Lab Results  Component Value Date   HGBA1C 10.9 (A) 10/25/2019   HGBA1C >14.0 (H) 07/19/2019   HGBA1C 13.7 (H) 04/07/2019     Lipid Panel ( most recent) Lipid Panel     Component Value Date/Time   CHOL 166 04/07/2019 1421   TRIG 362 (H) 04/07/2019 1421   HDL 44 (L) 04/07/2019 1421   CHOLHDL 3.8 04/07/2019 1421   LDLCALC 78 04/07/2019 1421      Lab Results  Component Value Date   TSH 1.15 07/19/2019   TSH 1.57 04/07/2019   FREET4 1.3 07/19/2019      Assessment & Plan:   1. Uncontrolled type 2 diabetes mellitus with hyperglycemia (HCC)  - Kathryn [name] has currently uncontrolled symptomatic type 2 DM since  49 years of age.  She presents with significant improvement in her glycemic profile.  Her point-of-care A1c is 10.9% improving from   >14% .  Her fasting glycemic profile is near target, no hypoglycemia.  Recent labs reviewed.  She presents with significantly above target glycemic profile was fasting and postprandial.    Her recent labs show a1c of  >14% .  - I had a long discussion with her about the progressive nature of diabetes and the pathology behind its complications. -her  diabetes is complicated by noncompliance/nonadherence, obesity/sedentary life and she remains at a high risk for more acute and chronic complications which include CAD, CVA, CKD, retinopathy, and neuropathy. These are all discussed in detail with her.  - I have counseled her on diet  and weight management  by adopting a carbohydrate restricted/protein rich diet. Patient is encouraged to switch to  unprocessed or minimally processed     complex starch and increased protein intake (animal or plant source), fruits, and vegetables. -  she is advised to stick to a routine mealtimes to eat 3 meals  a day and avoid unnecessary snacks ( to snack only to correct hypoglycemia).   - she  admits there is a room for improvement in her diet and drink choices. -  Suggestion is made for her to avoid simple carbohydrates  from her diet including Cakes, Sweet Desserts / Pastries, Ice Cream, Soda (diet and regular), Sweet Tea, Candies, Chips, Cookies, Sweet Pastries,  Store Bought Juices, Alcohol in Excess of  1-2 drinks a  day, Artificial Sweeteners, Coffee Creamer, and "Sugar-free" Products. This will help patient to have stable blood glucose profile and potentially avoid unintended weight gain.   - she will be scheduled with Jearld Fenton, RDN, CDE for diabetes education.  - I have approached her with the following individualized plan to manage  her diabetes and patient agrees:   -She has responded adequately to basal insulin reduction.  She will not need prandial insulin for now.   -She is advised to continue Toujeo 80 units nightly, will continue to monitor blood glucose twice daily-daily before breakfast and at bedtime.    -She is advised to continue Bydureon 2 mg subcutaneous weekly, advised to increase her glipizide to 10 mg p.o. daily at breakfast.   -She is urged to continue monitoring blood glucose strictly 4 times a day-before meals and at bedtime, and as needed.  -She reports intolerance to Metformin.     - she is encouraged to call clinic for blood glucose levels less than 70 or above 300 mg /dl.   - Specific targets for  A1c;  LDL, HDL,  and Triglycerides were discussed with the patient.  2) Blood Pressure /Hypertension: Her blood pressure is not controlled to target.   she is advised to continue her current medications including losartan 50 mg p.o. daily with breakfast .  3) Lipids/Hyperlipidemia:   Review of her recent lipid panel showed un controlled triglycerides at 362, and LDL at  67.  she  is advised to continue atorvastatin 10 mg p.o. daily at bedtime.  Side effects and precautions discussed with her.  4)  Weight/Diet:  Body mass index is 31.97 kg/m.  -     she is  a candidate for modest weight loss. I discussed with her the fact that loss of 5 - 10% of her  current body weight will have the most impact on her diabetes management.  Exercise, and detailed carbohydrates information provided  -  detailed on discharge instructions.  5) hypothyroidism: Longstanding diagnosis.  A new prescription for her levothyroxine 75 mcg was sent to her pharmacy.  She is advised to continue 25 mcg of levothyroxine daily before breakfast.    - We discussed about the correct intake of her thyroid hormone, on empty stomach at fasting, with water, separated by at least 30 minutes from breakfast and other medications,  and separated by more than 4 hours from calcium, iron, multivitamins, acid reflux medications (PPIs). -Patient is made aware of the fact that thyroid hormone replacement is needed for life, dose to be adjusted by periodic monitoring of thyroid function tests.   6) Chronic Care/Health Maintenance:  -she  is on ACEI/ARB and Statin medications and  is encouraged to initiate and continue to follow up with Ophthalmology, Dentist,  Podiatrist at least yearly or according to recommendations, and advised to   stay away from smoking. I have recommended yearly flu vaccine and pneumonia vaccine at least  every 5 years; moderate intensity exercise for up to 150 minutes weekly; and  sleep for at least 7 hours a day.  - she is  advised to maintain close follow up with Ailene Ards, NP for primary care needs, as well as her other providers for optimal and coordinated care.  - Time spent on this patient care encounter:  35 min, of which > 50% was spent in  counseling and the rest reviewing her blood glucose logs , discussing her hypoglycemia and hyperglycemia episodes, reviewing her current and  previous labs /  studies  ( including abstraction from other facilities) and medications  doses and developing a  long term treatment plan and documenting her care.   Please refer to Patient Instructions for Blood Glucose Monitoring and Insulin/Medications Dosing Guide"  in media tab for additional information. Please  also refer to " Patient Self Inventory" in the Media  tab for reviewed elements of pertinent patient history.  Kathryn [name] participated in the discussions, expressed understanding, and voiced agreement with the above plans.  All questions were answered to her satisfaction. she is encouraged to contact clinic should she have any questions or concerns prior to her return visit.   Follow up plan: - Return in about 3 months (around 01/25/2020) for Bring Meter and Logs- A1c in Office.  Glade Lloyd, MD [facility] [address] [place],  [address] Phone: [phone]  Fax: [phone]    10/25/2019, 9:30 AM  This note was partially dictated with voice recognition software. Similar sounding words can be transcribed inadequately or may not  be corrected upon review.

## 2019-10-25 NOTE — Patient Instructions (Signed)

## 2019-11-01 ENCOUNTER — Other Ambulatory Visit (INDEPENDENT_AMBULATORY_CARE_PROVIDER_SITE_OTHER): Payer: Self-pay | Admitting: Internal Medicine

## 2019-11-09 ENCOUNTER — Other Ambulatory Visit (INDEPENDENT_AMBULATORY_CARE_PROVIDER_SITE_OTHER): Payer: Self-pay

## 2019-11-09 MED ORDER — GABAPENTIN 300 MG PO CAPS: 300.0000 mg | ORAL_CAPSULE | Freq: Three times a day (TID) | ORAL | 3 refills | Status: DC

## 2019-11-13 ENCOUNTER — Other Ambulatory Visit (INDEPENDENT_AMBULATORY_CARE_PROVIDER_SITE_OTHER): Payer: Self-pay | Admitting: Nurse Practitioner

## 2019-11-13 DIAGNOSIS — K580 Irritable bowel syndrome with diarrhea: Secondary | ICD-10-CM

## 2019-11-16 ENCOUNTER — Other Ambulatory Visit: Payer: Self-pay | Admitting: "Endocrinology

## 2019-11-23 ENCOUNTER — Ambulatory Visit: Payer: Medicare Other | Admitting: Orthopedic Surgery

## 2019-12-10 ENCOUNTER — Other Ambulatory Visit (HOSPITAL_COMMUNITY): Payer: Self-pay | Admitting: Psychiatry

## 2019-12-11 ENCOUNTER — Other Ambulatory Visit (HOSPITAL_COMMUNITY): Payer: Self-pay | Admitting: Psychiatry

## 2019-12-12 NOTE — Telephone Encounter (Signed)
Call for appt

## 2019-12-14 ENCOUNTER — Ambulatory Visit: Payer: Medicare Other | Admitting: Orthopedic Surgery

## 2019-12-30 ENCOUNTER — Encounter (HOSPITAL_COMMUNITY): Payer: Self-pay | Admitting: Psychiatry

## 2019-12-30 ENCOUNTER — Other Ambulatory Visit: Payer: Self-pay

## 2019-12-30 ENCOUNTER — Telehealth (INDEPENDENT_AMBULATORY_CARE_PROVIDER_SITE_OTHER): Payer: Medicare Other | Admitting: Psychiatry

## 2019-12-30 DIAGNOSIS — F251 Schizoaffective disorder, depressive type: Secondary | ICD-10-CM

## 2019-12-30 DIAGNOSIS — F4310 Post-traumatic stress disorder, unspecified: Secondary | ICD-10-CM

## 2019-12-30 MED ORDER — RISPERIDONE 2 MG PO TABS: ORAL_TABLET | ORAL | 2 refills | Status: DC

## 2019-12-30 MED ORDER — BENZTROPINE MESYLATE 1 MG PO TABS: ORAL_TABLET | ORAL | 2 refills | Status: DC

## 2019-12-30 MED ORDER — TRAZODONE HCL 100 MG PO TABS: ORAL_TABLET | ORAL | 2 refills | Status: DC

## 2019-12-30 MED ORDER — DULOXETINE HCL 60 MG PO CPEP: 60.0000 mg | ORAL_CAPSULE | Freq: Two times a day (BID) | ORAL | 2 refills | Status: DC

## 2019-12-30 NOTE — Progress Notes (Signed)
Virtual Visit via Telephone Note  I connected with Kathryn [name] on 12/30/19 at  9:00 AM EDT by telephone and verified that I am speaking with the correct person using two identifiers.   I discussed the limitations, risks, security and privacy concerns of performing an evaluation and management service by telephone and the availability of in person appointments. I also discussed with the patient that there may be a patient responsible charge related to this service. The patient expressed understanding and agreed to proceed.    I discussed the assessment and treatment plan with the patient. The patient was provided an opportunity to ask questions and all were answered. The patient agreed with the plan and demonstrated an understanding of the instructions.   The patient was advised to call back or seek an in-person evaluation if the symptoms worsen or if the condition fails to improve as anticipated.  I provided 15 minutes of non-face-to-face time during this encounter. Location: Provider Home, patient home   Levonne Spiller, MD  [employer] MD/PA/NP OP Progress Note  12/30/2019 9:11 AM Kathryn [name]  MRN:  [id number]  Chief Complaint:  Chief Complaint    Schizophrenia; Depression; Anxiety; Follow-up     HPI: This patient is a 49 year old separated black female who lives with her daughter in [place].  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  The patient returns after 4 months.  She states that she got a part-time job and she really likes it.  Is been good for her to have something to do on her and some extra money.  She continues to try to work on her diabetes and has gotten her A1c down from 14-10.  She is trying to diet and exercise more she states that she only occasionally has hallucinations and they are not bothering her.  She is sleeping well.  She denies being depressed or having thoughts of suicide or self-harm.  She states that overall  she feels she is doing well. Visit Diagnosis:    ICD-10-CM   1. Schizoaffective disorder, depressive type ([place])  F25.1   2. PTSD (post-traumatic stress disorder)  F43.10     Past Psychiatric History: 2 previous psychiatric hospitalizations for schizoaffective disorder  Past Medical History:  Past Medical History:  Diagnosis Date  . Anemia   . Anxiety   . Asthma   . Depression   . GERD (gastroesophageal reflux disease)   . HLD (hyperlipidemia) 04/07/2019  . Hypertension   . Hypothyroidism, adult 04/07/2019  . Neuropathy   . Obesity (BMI 30.0-34.9) 04/07/2019  . Schizoaffective disorder ([place])   . Type II diabetes mellitus, uncontrolled ([place]) 04/27/2019    Past Surgical History:  Procedure Laterality Date  . BREAST SURGERY Right    biopsy  . CESAREAN SECTION    . CHOLECYSTECTOMY  06/02/2012   Procedure: LAPAROSCOPIC CHOLECYSTECTOMY;  Surgeon: Jamesetta So, MD;  Location: [facility];  Service: General;  Laterality: N/A;  Attempted laparoscopic cholecystectomy  . CHOLECYSTECTOMY  06/02/2012   Procedure: CHOLECYSTECTOMY;  Surgeon: Jamesetta So, MD;  Location: [facility];  Service: General;  Laterality: N/A;  converted to open at  0905  . ESOPHAGEAL DILATION  12/31/2018   Procedure: ESOPHAGEAL DILATION;  Surgeon: Rogene Houston, MD;  Location: [facility];  Service: Endoscopy;;  . ESOPHAGOGASTRODUODENOSCOPY (EGD) WITH PROPOFOL N/A 08/24/2015   Procedure: ESOPHAGOGASTRODUODENOSCOPY (EGD) WITH PROPOFOL;  Surgeon: Rogene Houston, MD;  Location: [facility];  Service: Endoscopy;  Laterality: N/A;  1:10 - Ann to notify pt to arrive at 11:30  . ESOPHAGOGASTRODUODENOSCOPY (EGD) WITH PROPOFOL N/A 12/31/2018   Procedure: ESOPHAGOGASTRODUODENOSCOPY (EGD) WITH PROPOFOL;  Surgeon: Rogene Houston, MD;  Location: [facility];  Service: Endoscopy;  Laterality: N/A;  . tooth removal  2019   all teeth removed  . TUBAL LIGATION     X3    Family Psychiatric History: see below  Family History:   Family History  Problem Relation Age of Onset  . Hypertension Mother   . Alcohol abuse Mother   . Heart disease Mother   . Kidney disease Mother   . Hypertension Father   . Alcohol abuse Sister   . Stroke Other   . Diabetes Other   . Cancer Other   . Seizures Other   . Alcohol abuse Maternal Aunt   . Alcohol abuse Paternal Aunt   . Alcohol abuse Maternal Grandfather   . Alcohol abuse Maternal Grandmother   . Alcohol abuse Cousin   . Hearing loss Daughter     Social History:  Social History   Socioeconomic History  . Marital status: Married    Spouse name: Not on file  . Number of children: Not on file  . Years of education: Not on file  . Highest education level: Not on file  Occupational History  . Not on file  Tobacco Use  . Smoking status: Never Smoker  . Smokeless tobacco: Never Used  Vaping Use  . Vaping Use: Never used  Substance and Sexual Activity  . Alcohol use: No    Alcohol/week: 0.0 standard drinks  . Drug use: No  . Sexual activity: Not Currently    Birth control/protection: Surgical  Other Topics Concern  . Not on file  Social History Narrative   Married for 22 years.On disability secondary to schizophrenia.Lives with husband.   Social Determinants of Health   Financial Resource Strain:   . Difficulty of Paying Living Expenses:   Food Insecurity:   . Worried About Charity fundraiser in the Last Year:   . Arboriculturist in the Last Year:   Transportation Needs:   . Film/video editor (Medical):   Marland Kitchen Lack of Transportation (Non-Medical):   Physical Activity:   . Days of Exercise per Week:   . Minutes of Exercise per Session:   Stress:   . Feeling of Stress :   Social Connections:   . Frequency of Communication with Friends and Family:   . Frequency of Social Gatherings with Friends and Family:   . Attends Religious Services:   . Active Member of Clubs or Organizations:   . Attends Archivist Meetings:   Marland Kitchen Marital Status:      Allergies: No Known Allergies  Metabolic Disorder Labs: Lab Results  Component Value Date   HGBA1C 10.9 (A) 10/25/2019   MPG  07/19/2019     Comment:     eAG cannot be calculated. Hemoglobin A1c result exceeds the linearity of the assay.    MPG 346 04/07/2019   No results found for: PROLACTIN Lab Results  Component Value Date   CHOL 166 04/07/2019   TRIG 362 (H) 04/07/2019   HDL 44 (L) 04/07/2019   CHOLHDL 3.8 04/07/2019   LDLCALC 78 04/07/2019   Lab Results  Component Value Date   TSH 1.15 07/19/2019   TSH 1.57 04/07/2019    Therapeutic Level Labs: Lab Results  Component Value Date  LITHIUM <0.06 (L) 07/03/2016   LITHIUM 1.17 09/09/2015   No results found for: VALPROATE No components found for:  CBMZ  Current Medications: Current Outpatient Medications  Medication Sig Dispense Refill  . aspirin EC 81 MG tablet Take 81 mg by mouth daily.    Marland Kitchen atorvastatin (LIPITOR) 10 MG tablet TAKE 1 TABLET(10 MG) BY MOUTH DAILY 90 tablet 1  . benztropine (COGENTIN) 1 MG tablet TAKE 1 TABLET(1 MG) BY MOUTH AT BEDTIME 30 tablet 2  . carvedilol (COREG) 12.5 MG tablet Take 1 tablet (12.5 mg total) by mouth 2 (two) times daily. 180 tablet 1  . Cholecalciferol (VITAMIN D-3) 125 MCG (5000 UT) TABS Take 2 tablets by mouth daily. 30 tablet 1  . dicyclomine (BENTYL) 10 MG capsule TAKE 1 CAPSULE(10 MG) BY MOUTH THREE TIMES DAILY BEFORE MEALS 90 capsule 2  . DULoxetine (CYMBALTA) 60 MG capsule Take 1 capsule (60 mg total) by mouth 2 (two) times daily. 60 capsule 2  . Exenatide ER (BYDUREON BCISE) 2 MG/0.85ML AUIJ Inject 2 mg into the skin every Monday. 4 pen 2  . famotidine (PEPCID) 20 MG tablet Take 1 tablet (20 mg total) by mouth 2 (two) times daily. 60 tablet 2  . FLOVENT HFA 44 MCG/ACT inhaler Inhale 2 puffs into the lungs 2 (two) times daily as needed (wheezing or shortness of breath).     . gabapentin (NEURONTIN) 300 MG capsule Take 1 capsule (300 mg total) by mouth 3 (three)  times daily. 90 capsule 3  . glipiZIDE (GLUCOTROL XL) 5 MG 24 hr tablet Take 2 tablets (10 mg total) by mouth daily with breakfast. 180 tablet 1  . glucose blood (ACCU-CHEK GUIDE) test strip Use as instructed 150 each 2  . insulin glargine, 2 Unit Dial, (TOUJEO MAX SOLOSTAR) 300 UNIT/ML Solostar Pen Inject 80 Units into the skin daily. 4 pen 2  . levothyroxine (SYNTHROID) 75 MCG tablet Take 1 tablet (75 mcg total) by mouth daily before breakfast. 90 tablet 1  . LINZESS 145 MCG CAPS capsule TAKE 1 CAPSULE BY MOUTH DAILY (Patient taking differently: Take 145 mcg by mouth daily before breakfast. ) 90 capsule 0  . losartan (COZAAR) 50 MG tablet TAKE 1 TABLET(50 MG) BY MOUTH DAILY 90 tablet 1  . omeprazole (PRILOSEC) 20 MG capsule TAKE 1 CAPSULE BY MOUTH EVERY DAY 90 capsule 0  . ondansetron (ZOFRAN) 4 MG tablet Take 1 tablet (4 mg total) by mouth every 6 (six) hours. as needed for nausea or vomiting. 10 tablet 0  . risperiDONE (RISPERDAL) 2 MG tablet Take one tablet qam three tablets at bedtime 120 tablet 2  . traZODone (DESYREL) 100 MG tablet TAKE 2 TABLETS(200 MG) BY MOUTH AT BEDTIME 60 tablet 2   No current facility-administered medications for this visit.     Musculoskeletal: Strength & Muscle Tone: within normal limits Gait & Station: normal Patient leans: N/A  Psychiatric Specialty Exam: Review of Systems  Neurological: Positive for headaches.  All other systems reviewed and are negative.   Last menstrual period 01/23/2013.There is no height or weight on file to calculate BMI.  General Appearance: NA  Eye Contact:  NA  Speech:  Clear and Coherent  Volume:  Normal  Mood:  Euthymic  Affect:  NA  Thought Process:  Goal Directed  Orientation:  Full (Time, Place, and Person)  Thought Content: WDL   Suicidal Thoughts:  No  Homicidal Thoughts:  No  Memory:  Immediate;   Good Recent;   Good Remote;  Fair  Judgement:  Good  Insight:  Fair  Psychomotor Activity:  Normal   Concentration:  Concentration: Good and Attention Span: Good  Recall:  Good  Fund of Knowledge: Fair  Language: Good  Akathisia:  No  Handed:  Right  AIMS (if indicated): not done  Assets:  Communication Skills Desire for Improvement Resilience Social Support Talents/Skills  ADL's:  Intact  Cognition: WNL  Sleep:  Good   Screenings: PHQ2-9     Office Visit from 10/12/2019 in [place] from 08/15/2019 in [facility] Office Visit from 08/03/2017 in [place] Patient Outreach Telephone from 03/03/2017 in [place] Patient Outreach Telephone from 02/04/2017 in [place]  PHQ-2 Total Score 0 0 0 0 0       Assessment and Plan: This patient is a 49 year old female with a history of schizoaffective disorder and posttraumatic stress disorder.  She has been stable on her current regimen.  She did not ask about cutting down the risperdal this time and I think we will leave it as she is making progress on her A1c.  She will continue Risperdal 2 mg in the morning and 6 mg in the evening for schizophrenia, Cogentin 1 mg at bedtime to prevent side effects from Risperdal, Cymbalta 60 mg twice daily for depression and trazodone 200 mg at bedtime for sleep.  She will return to see me in 3 months   Levonne Spiller, MD 12/30/2019, 9:11 AM

## 2020-01-09 ENCOUNTER — Ambulatory Visit (INDEPENDENT_AMBULATORY_CARE_PROVIDER_SITE_OTHER): Payer: Medicare Other | Admitting: Gastroenterology

## 2020-01-10 ENCOUNTER — Other Ambulatory Visit (INDEPENDENT_AMBULATORY_CARE_PROVIDER_SITE_OTHER): Payer: Self-pay | Admitting: Internal Medicine

## 2020-01-10 ENCOUNTER — Other Ambulatory Visit (INDEPENDENT_AMBULATORY_CARE_PROVIDER_SITE_OTHER): Payer: Self-pay | Admitting: Nurse Practitioner

## 2020-01-10 DIAGNOSIS — E782 Mixed hyperlipidemia: Secondary | ICD-10-CM

## 2020-01-12 ENCOUNTER — Ambulatory Visit (INDEPENDENT_AMBULATORY_CARE_PROVIDER_SITE_OTHER): Payer: Medicare Other | Admitting: Nurse Practitioner

## 2020-01-13 ENCOUNTER — Other Ambulatory Visit (INDEPENDENT_AMBULATORY_CARE_PROVIDER_SITE_OTHER): Payer: Self-pay | Admitting: Internal Medicine

## 2020-01-16 ENCOUNTER — Other Ambulatory Visit (INDEPENDENT_AMBULATORY_CARE_PROVIDER_SITE_OTHER): Payer: Self-pay | Admitting: Nurse Practitioner

## 2020-01-19 ENCOUNTER — Other Ambulatory Visit: Payer: Self-pay

## 2020-01-19 ENCOUNTER — Ambulatory Visit (INDEPENDENT_AMBULATORY_CARE_PROVIDER_SITE_OTHER): Payer: Medicare Other | Admitting: Gastroenterology

## 2020-01-19 ENCOUNTER — Encounter (INDEPENDENT_AMBULATORY_CARE_PROVIDER_SITE_OTHER): Payer: Self-pay | Admitting: Gastroenterology

## 2020-01-19 VITALS — BP 130/87 | HR 89 | Temp 98.7°F | Ht 62.0 in | Wt 168.0 lb

## 2020-01-19 DIAGNOSIS — K5909 Other constipation: Secondary | ICD-10-CM | POA: Diagnosis not present

## 2020-01-19 DIAGNOSIS — R1013 Epigastric pain: Secondary | ICD-10-CM | POA: Diagnosis not present

## 2020-01-19 DIAGNOSIS — K219 Gastro-esophageal reflux disease without esophagitis: Secondary | ICD-10-CM | POA: Diagnosis not present

## 2020-01-19 MED ORDER — LINACLOTIDE 290 MCG PO CAPS: 290.0000 ug | ORAL_CAPSULE | Freq: Every day | ORAL | 1 refills | Status: DC

## 2020-01-19 MED ORDER — PANTOPRAZOLE SODIUM 40 MG PO TBEC: 40.0000 mg | DELAYED_RELEASE_TABLET | Freq: Two times a day (BID) | ORAL | 3 refills | Status: DC

## 2020-01-19 NOTE — Progress Notes (Addendum)
Patient profile: Kathryn [name] is a 49 y.o. female seen for evaluation of n/v.  History of Present Illness: Kathryn [name] is seen today for follow-up, she reports over the past 2 months developing issues with epigastric pain, she describes "bloating inflammation tenderness" in the epigastric area.  Seems to worsen with eating.  She is typically having a fullness after taking 3-4 bites of food.  She has some nausea with the symptoms but denies frequent vomiting.  She does have GERD symptoms despite Pepcid twice a day.  She is even more symptomatic if she misses the Pepcid.  She denies any new dysphagia-chronic mild dysphagia relatively unchanged after endoscopy with empiric dilation a year ago.  She is having constipation despite Linzess 145 mcg, usually goes q2 to 3 days, but occasionally up to a week between bowel movements.  If she misses more than 2 to 3 days she will take double dose of Linzess which seems to help.  She denies hematochezia or melena.  Abdominal pain resolves with defecation.  Denies frequent nsaids, alcohol, non smoker.   Wt Readings from Last 3 Encounters:  01/19/20 168 lb (76.2 kg)  10/25/19 174 lb 12.8 oz (79.3 kg)  10/13/19 171 lb 3.2 oz (77.7 kg)     Last Colonoscopy: none prior  Last Endoscopy: 12/2018-- Normal hypopharynx and laryngeal inlet. - Normal esophagus. - Z-line regular, 38 cm from the incisors. - No endoscopic esophageal abnormality to explain patient's dysphagia. Esophagus dilated. Dilated. - Normal stomach. - Normal duodenal bulb and second portion of the duodenum. - No specimens collected.   Past Medical History:  Past Medical History:  Diagnosis Date  . Anemia   . Anxiety   . Asthma   . Depression   . GERD (gastroesophageal reflux disease)   . HLD (hyperlipidemia) 04/07/2019  . Hypertension   . Hypothyroidism, adult 04/07/2019  . Neuropathy   . Obesity (BMI 30.0-34.9) 04/07/2019  . Schizoaffective disorder ([place])   . Type II  diabetes mellitus, uncontrolled ([place]) 04/27/2019    Problem List: Patient Active Problem List   Diagnosis Date Noted  . Vitamin D deficiency 07/15/2019  . Type II diabetes mellitus, uncontrolled ([place]) 04/27/2019  . Hypothyroidism 04/07/2019  . Obesity (BMI 30.0-34.9) 04/07/2019  . Mixed hyperlipidemia 04/07/2019  . Abnormal esophagram 11/23/2018  . GERD (gastroesophageal reflux disease) 10/08/2015  . IBS (irritable colon syndrome) 10/08/2015  . Essential hypertension, benign 08/22/2015  . Schizoaffective disorder ([place]) 02/08/2015  . PTSD (post-traumatic stress disorder) 02/08/2015    Past Surgical History: Past Surgical History:  Procedure Laterality Date  . BREAST SURGERY Right    biopsy  . CESAREAN SECTION    . CHOLECYSTECTOMY  06/02/2012   Procedure: LAPAROSCOPIC CHOLECYSTECTOMY;  Surgeon: Jamesetta So, MD;  Location: [facility];  Service: General;  Laterality: N/A;  Attempted laparoscopic cholecystectomy  . CHOLECYSTECTOMY  06/02/2012   Procedure: CHOLECYSTECTOMY;  Surgeon: Jamesetta So, MD;  Location: [facility];  Service: General;  Laterality: N/A;  converted to open at  0905  . ESOPHAGEAL DILATION  12/31/2018   Procedure: ESOPHAGEAL DILATION;  Surgeon: Rogene Houston, MD;  [facility];  Service: Endoscopy;;  . ESOPHAGOGASTRODUODENOSCOPY (EGD) WITH PROPOFOL N/A 08/24/2015   Procedure: ESOPHAGOGASTRODUODENOSCOPY (EGD) WITH PROPOFOL;  Surgeon: Rogene Houston, MD;  [facility];  Service: Endoscopy;  Laterality: N/A;  1:10 - Ann to notify pt to arrive at 11:30  . ESOPHAGOGASTRODUODENOSCOPY (EGD) WITH PROPOFOL N/A 12/31/2018   Procedure: ESOPHAGOGASTRODUODENOSCOPY (EGD)  WITH PROPOFOL;  Surgeon: Rogene Houston, MD;  [facility];  Service: Endoscopy;  Laterality: N/A;  . tooth removal  2019   all teeth removed  . TUBAL LIGATION     X3    Allergies: No Known Allergies    Home Medications:  Current Outpatient Medications:  .  aspirin EC 81 MG  tablet, Take 81 mg by mouth daily., Disp: , Rfl:  .  atorvastatin (LIPITOR) 10 MG tablet, TAKE 1 TABLET(10 MG) BY MOUTH DAILY, Disp: 90 tablet, Rfl: 1 .  benztropine (COGENTIN) 1 MG tablet, TAKE 1 TABLET(1 MG) BY MOUTH AT BEDTIME, Disp: 30 tablet, Rfl: 2 .  carvedilol (COREG) 12.5 MG tablet, TAKE 1 TABLET BY MOUTH TWICE DAILY, Disp: 180 tablet, Rfl: 1 .  Cholecalciferol (VITAMIN D-3) 125 MCG (5000 UT) TABS, Take 2 tablets by mouth daily., Disp: 30 tablet, Rfl: 1 .  dicyclomine (BENTYL) 10 MG capsule, TAKE 1 CAPSULE(10 MG) BY MOUTH THREE TIMES DAILY BEFORE MEALS, Disp: 90 capsule, Rfl: 2 .  DULoxetine (CYMBALTA) 60 MG capsule, Take 1 capsule (60 mg total) by mouth 2 (two) times daily., Disp: 60 capsule, Rfl: 2 .  Exenatide ER (BYDUREON BCISE) 2 MG/0.85ML AUIJ, Inject 2 mg into the skin every Monday., Disp: 4 pen, Rfl: 2 .  famotidine (PEPCID) 20 MG tablet, Take 1 tablet (20 mg total) by mouth 2 (two) times daily., Disp: 60 tablet, Rfl: 2 .  FLOVENT HFA 44 MCG/ACT inhaler, Inhale 2 puffs into the lungs 2 (two) times daily as needed (wheezing or shortness of breath). , Disp: , Rfl:  .  gabapentin (NEURONTIN) 300 MG capsule, Take 1 capsule (300 mg total) by mouth 3 (three) times daily., Disp: 90 capsule, Rfl: 3 .  glipiZIDE (GLUCOTROL XL) 5 MG 24 hr tablet, Take 2 tablets (10 mg total) by mouth daily with breakfast., Disp: 180 tablet, Rfl: 1 .  glucose blood (ACCU-CHEK GUIDE) test strip, Use as instructed, Disp: 150 each, Rfl: 2 .  insulin glargine, 2 Unit Dial, (TOUJEO MAX SOLOSTAR) 300 UNIT/ML Solostar Pen, Inject 80 Units into the skin daily., Disp: 4 pen, Rfl: 2 .  levothyroxine (SYNTHROID) 75 MCG tablet, Take 1 tablet (75 mcg total) by mouth daily before breakfast., Disp: 90 tablet, Rfl: 1 .  losartan (COZAAR) 50 MG tablet, TAKE 1 TABLET(50 MG) BY MOUTH DAILY, Disp: 30 tablet, Rfl: 0 .  ondansetron (ZOFRAN) 4 MG tablet, Take 1 tablet (4 mg total) by mouth every 6 (six) hours. as needed for nausea or  vomiting., Disp: 10 tablet, Rfl: 0 .  risperiDONE (RISPERDAL) 2 MG tablet, Take one tablet qam three tablets at bedtime, Disp: 120 tablet, Rfl: 2 .  traZODone (DESYREL) 100 MG tablet, TAKE 2 TABLETS(200 MG) BY MOUTH AT BEDTIME, Disp: 60 tablet, Rfl: 2 .  linaclotide (LINZESS) 290 MCG CAPS capsule, Take 1 capsule (290 mcg total) by mouth daily before breakfast., Disp: 30 capsule, Rfl: 1 .  pantoprazole (PROTONIX) 40 MG tablet, Take 1 tablet (40 mg total) by mouth 2 (two) times daily before a meal., Disp: 60 tablet, Rfl: 3   Family History: family history includes Alcohol abuse in her cousin, maternal aunt, maternal grandfather, maternal grandmother, mother, paternal aunt, and sister; Cancer in an other family member; Diabetes in an other family member; Hearing loss in her daughter; Heart disease in her mother; Hypertension in her father and mother; Kidney disease in her mother; Seizures in an other family member; Stroke in an other family member.  Social History:   reports that she has never smoked. She has never used smokeless tobacco. She reports that she does not drink alcohol and does not use drugs.   Review of Systems: Constitutional: Denies weight loss/weight gain  Eyes: No changes in vision. ENT: No oral lesions, sore throat.  GI: see HPI.  Heme/Lymph: No easy bruising.  CV: No chest pain.  GU: No hematuria.  Integumentary: No rashes.  Neuro: No headaches.  Psych: No depression/anxiety.  Endocrine: No heat/cold intolerance.  Allergic/Immunologic: No urticaria.  Resp: No cough, SOB.  Musculoskeletal: No joint swelling.    Physical Examination: BP 130/87 (BP Location: Right Arm, Patient Position: Sitting, Cuff Size: Normal)   Pulse 89   Temp 98.7 F (37.1 C) (Oral)   Ht 5\' 2"  (1.575 m)   Wt 168 lb (76.2 kg)   LMP 01/23/2013   BMI 30.73 kg/m  Gen: NAD, alert and oriented x 4 HEENT: PEERLA, EOMI, Neck: supple, no JVD Chest: CTA bilaterally, no wheezes, crackles, or other  adventitious sounds CV: RRR, no m/g/c/r Abd: soft, NT, ND, +BS in all four quadrants; no HSM, guarding, ridigity, or rebound tenderness Ext: no edema, well perfused with 2+ pulses, Skin: no rash or lesions noted on observed skin Lymph: no noted LAD  Data Reviewed:  Barium study and 10/2018 IMPRESSION: Polypoid filling defect at the anterior aspect of the hypopharynx on lateral swallowing views, not definitely confirmed on [facility] imaging; hypopharyngeal mass is not excluded and further assessment by endoscopy is recommended to exclude tumor.   08/2019--cbc normal, lipase normal, Ca 8.8, glucose 271, A1c 10.9  Assessment/Plan: Ms. Lassalle is a 49 y.o. female seen for evaluation for nausea & epigastric pain  1.  Nausea/epigastric pain-with associated GERD symptoms, currently on Pepcid twice daily.  We will try her on a PPI as may have gastritis, peptic ulcer disease, etc.  She had an endoscopy last year.  If does not respond to empiric therapy would schedule gastric emptying scan for evaluation of gastoparesis as she does have underlying poorly controlled diabetes.  She reports labs at her PCP recently and will request these.  2.  Constipation-currently on Linzess 145 mcg but taking 2 pills some days and feels higher dose works better.  Will Rx 290 mcg.  Encouraged her to make sure she is getting adequate fiber and fluid  Kimmy was seen today for nausea, emesis and dysphagia.  Diagnoses and all orders for this visit:  Epigastric pain  Chronic GERD  Chronic constipation  Other orders -     pantoprazole (PROTONIX) 40 MG tablet; Take 1 tablet (40 mg total) by mouth 2 (two) times daily before a meal. -     linaclotide (LINZESS) 290 MCG CAPS capsule; Take 1 capsule (290 mcg total) by mouth daily before breakfast.     Office visit in 4 weeks to follow-up and assess response to medication changes.  She will call sooner if not improving.   I personally performed the service, non-incident  to. (WP)  Laurine Blazer, [facility]

## 2020-01-19 NOTE — Patient Instructions (Signed)
Start Protonix 40 mg twice a day-take 30 minutes before meals.  This was for stomach ulcers.  We will see back in about 6 weeks if you are not improved we will get labs and a gastric emptying scan

## 2020-01-27 ENCOUNTER — Ambulatory Visit: Payer: Medicare Other | Admitting: Nurse Practitioner

## 2020-02-07 ENCOUNTER — Encounter: Payer: Self-pay | Admitting: Nurse Practitioner

## 2020-02-07 ENCOUNTER — Other Ambulatory Visit: Payer: Self-pay

## 2020-02-07 ENCOUNTER — Encounter (INDEPENDENT_AMBULATORY_CARE_PROVIDER_SITE_OTHER): Payer: Self-pay | Admitting: Nurse Practitioner

## 2020-02-07 ENCOUNTER — Ambulatory Visit (INDEPENDENT_AMBULATORY_CARE_PROVIDER_SITE_OTHER): Payer: Medicare Other | Admitting: Nurse Practitioner

## 2020-02-07 VITALS — BP 130/70 | HR 78 | Temp 97.5°F | Resp 18 | Ht 63.0 in | Wt 164.0 lb

## 2020-02-07 DIAGNOSIS — E559 Vitamin D deficiency, unspecified: Secondary | ICD-10-CM | POA: Diagnosis not present

## 2020-02-07 DIAGNOSIS — E039 Hypothyroidism, unspecified: Secondary | ICD-10-CM

## 2020-02-07 DIAGNOSIS — K219 Gastro-esophageal reflux disease without esophagitis: Secondary | ICD-10-CM | POA: Diagnosis not present

## 2020-02-07 DIAGNOSIS — I10 Essential (primary) hypertension: Secondary | ICD-10-CM

## 2020-02-07 DIAGNOSIS — E782 Mixed hyperlipidemia: Secondary | ICD-10-CM | POA: Diagnosis not present

## 2020-02-07 DIAGNOSIS — E1165 Type 2 diabetes mellitus with hyperglycemia: Secondary | ICD-10-CM

## 2020-02-07 DIAGNOSIS — J4540 Moderate persistent asthma, uncomplicated: Secondary | ICD-10-CM

## 2020-02-07 DIAGNOSIS — R079 Chest pain, unspecified: Secondary | ICD-10-CM

## 2020-02-07 MED ORDER — CARVEDILOL 12.5 MG PO TABS: 12.5000 mg | ORAL_TABLET | Freq: Two times a day (BID) | ORAL | 1 refills | Status: DC

## 2020-02-07 MED ORDER — LOSARTAN POTASSIUM 50 MG PO TABS
50.0000 mg | ORAL_TABLET | Freq: Every day | ORAL | 1 refills | Status: DC
Start: 2020-02-07 — End: 2020-06-30

## 2020-02-07 MED ORDER — DULOXETINE HCL 60 MG PO CPEP: 60.0000 mg | ORAL_CAPSULE | Freq: Two times a day (BID) | ORAL | 2 refills | Status: DC

## 2020-02-07 MED ORDER — GABAPENTIN 300 MG PO CAPS: 300.0000 mg | ORAL_CAPSULE | Freq: Three times a day (TID) | ORAL | 3 refills | Status: DC

## 2020-02-07 MED ORDER — FLOVENT HFA 44 MCG/ACT IN AERO: 2.0000 | INHALATION_SPRAY | Freq: Two times a day (BID) | RESPIRATORY_TRACT | 3 refills | Status: DC

## 2020-02-07 MED ORDER — ATORVASTATIN CALCIUM 10 MG PO TABS: 10.0000 mg | ORAL_TABLET | Freq: Every day | ORAL | 1 refills | Status: DC

## 2020-02-07 MED ORDER — GLIPIZIDE ER 5 MG PO TB24: 10.0000 mg | ORAL_TABLET | Freq: Every day | ORAL | 1 refills | Status: DC

## 2020-02-07 MED ORDER — ONDANSETRON HCL 4 MG PO TABS: 4.0000 mg | ORAL_TABLET | Freq: Four times a day (QID) | ORAL | 2 refills | Status: DC

## 2020-02-07 MED ORDER — ALBUTEROL SULFATE HFA 108 (90 BASE) MCG/ACT IN AERS: 2.0000 | INHALATION_SPRAY | Freq: Four times a day (QID) | RESPIRATORY_TRACT | 0 refills | Status: DC | PRN

## 2020-02-07 NOTE — Patient Instructions (Signed)
Flovent - use every day to maintain airway function. This inhaler IS NOT for rescue. Swish and spit with water after use.  Albuterol - use this AS A RESCUE inhaler. Take when you have acute shortness of breath or wheezing. May inhale 2 puffs every 6 hours only when needed.

## 2020-02-07 NOTE — Progress Notes (Signed)
Subjective:  Patient ID: Kathryn [name], female    DOB: April 15, 1971  Age: 49 y.o. MRN: [id number]  CC:  Chief Complaint  Patient presents with  . Follow-up  . Hypertension  . Hyperlipidemia  . Other    Vitamin D deficiency, chest pain  . Hypothyroidism      HPI  This patient arrives today for the above.  Hypertension: She continues on Coreg and losartan is tolerating these well.  Hyperlipidemia: She continues on 81 mg aspirin and atorvastatin.  Last lipid panel showed LDL 78.  Vitamin D deficiency: Last vitamin D level was 32 this was collected approximately 7 months ago.  She continues on her vitamin D3 supplement.  Chest pain: She tells me over the last couple months been having intermittent episodes of chest pain.  The last for about 60 to 90 minutes.  She tells me they seem to be triggered by anxiety and resting while listening to relaxing music resolves the pain.  She denies any nausea, vomiting, referred pain, or shortness of breath during episodes.  She tells me the usually occur when she is driving home from a relaxing weekend away with her husband.  She believes she is due to see her cardiologist in March.  Hypothyroidism: Last TSH was collected approximately 7 months ago and it was within the normal range.  She continues on levothyroxine 75 mcg daily.  Past Medical History:  Diagnosis Date  . Anemia   . Anxiety   . Asthma   . Depression   . GERD (gastroesophageal reflux disease)   . HLD (hyperlipidemia) 04/07/2019  . Hypertension   . Hypothyroidism, adult 04/07/2019  . Neuropathy   . Obesity (BMI 30.0-34.9) 04/07/2019  . Schizoaffective disorder ([place])   . Type II diabetes mellitus, uncontrolled ([place]) 04/27/2019      Family History  Problem Relation Age of Onset  . Hypertension Mother   . Alcohol abuse Mother   . Heart disease Mother   . Kidney disease Mother   . Hypertension Father   . Alcohol abuse Sister   . Stroke Other   . Diabetes Other    . Cancer Other   . Seizures Other   . Alcohol abuse Maternal Aunt   . Alcohol abuse Paternal Aunt   . Alcohol abuse Maternal Grandfather   . Alcohol abuse Maternal Grandmother   . Alcohol abuse Cousin   . Hearing loss Daughter     Social History   Social History Narrative   Married for 22 years.On disability secondary to schizophrenia.Lives with husband.   Social History   Tobacco Use  . Smoking status: Never Smoker  . Smokeless tobacco: Never Used  Substance Use Topics  . Alcohol use: No    Alcohol/week: 0.0 standard drinks     Current Meds  Medication Sig  . aspirin EC 81 MG tablet Take 81 mg by mouth daily.  Marland Kitchen atorvastatin (LIPITOR) 10 MG tablet Take 1 tablet (10 mg total) by mouth at bedtime.  . benztropine (COGENTIN) 1 MG tablet TAKE 1 TABLET(1 MG) BY MOUTH AT BEDTIME  . carvedilol (COREG) 12.5 MG tablet Take 1 tablet (12.5 mg total) by mouth 2 (two) times daily.  . Cholecalciferol (VITAMIN D-3) 125 MCG (5000 UT) TABS Take 2 tablets by mouth daily.  Marland Kitchen dicyclomine (BENTYL) 10 MG capsule TAKE 1 CAPSULE(10 MG) BY MOUTH THREE TIMES DAILY BEFORE MEALS  . DULoxetine (CYMBALTA) 60 MG capsule Take 1 capsule (60 mg total) by mouth 2 (two)  times daily.  . famotidine (PEPCID) 20 MG tablet Take 1 tablet (20 mg total) by mouth 2 (two) times daily.  Marland Kitchen FLOVENT HFA 44 MCG/ACT inhaler Inhale 2 puffs into the lungs in the morning and at bedtime.  Marland Kitchen glipiZIDE (GLUCOTROL XL) 5 MG 24 hr tablet Take 2 tablets (10 mg total) by mouth daily with breakfast.  . glucose blood (ACCU-CHEK GUIDE) test strip Use as instructed  . insulin glargine, 2 Unit Dial, (TOUJEO MAX SOLOSTAR) 300 UNIT/ML Solostar Pen Inject 80 Units into the skin daily.  Marland Kitchen levothyroxine (SYNTHROID) 75 MCG tablet Take 1 tablet (75 mcg total) by mouth daily before breakfast.  . linaclotide (LINZESS) 290 MCG CAPS capsule Take 1 capsule (290 mcg total) by mouth daily before breakfast.  . losartan (COZAAR) 50 MG tablet Take 1 tablet  (50 mg total) by mouth daily.  . ondansetron (ZOFRAN) 4 MG tablet Take 1 tablet (4 mg total) by mouth every 6 (six) hours. as needed for nausea or vomiting.  . pantoprazole (PROTONIX) 40 MG tablet Take 1 tablet (40 mg total) by mouth 2 (two) times daily before a meal.  . risperiDONE (RISPERDAL) 2 MG tablet Take one tablet qam three tablets at bedtime  . traZODone (DESYREL) 100 MG tablet TAKE 2 TABLETS(200 MG) BY MOUTH AT BEDTIME  . [DISCONTINUED] atorvastatin (LIPITOR) 10 MG tablet TAKE 1 TABLET(10 MG) BY MOUTH DAILY  . [DISCONTINUED] carvedilol (COREG) 12.5 MG tablet TAKE 1 TABLET BY MOUTH TWICE DAILY  . [DISCONTINUED] DULoxetine (CYMBALTA) 60 MG capsule Take 1 capsule (60 mg total) by mouth 2 (two) times daily.  . [DISCONTINUED] FLOVENT HFA 44 MCG/ACT inhaler Inhale 2 puffs into the lungs 2 (two) times daily as needed (wheezing or shortness of breath).   . [DISCONTINUED] glipiZIDE (GLUCOTROL XL) 5 MG 24 hr tablet Take 2 tablets (10 mg total) by mouth daily with breakfast.  . [DISCONTINUED] losartan (COZAAR) 50 MG tablet TAKE 1 TABLET(50 MG) BY MOUTH DAILY  . [DISCONTINUED] ondansetron (ZOFRAN) 4 MG tablet Take 1 tablet (4 mg total) by mouth every 6 (six) hours. as needed for nausea or vomiting.    ROS:  Review of Systems  Constitutional: Negative for malaise/fatigue.  Eyes: Negative for blurred vision.  Respiratory: Negative for shortness of breath.   Cardiovascular: Positive for chest pain.  Neurological: Positive for headaches. Negative for dizziness.     Objective:   Today's Vitals: BP 130/70 (BP Location: Left Arm, Patient Position: Sitting, Cuff Size: Normal)   Pulse 78   Temp (!) 97.5 F (36.4 C) (Temporal)   Resp 18   Ht _0  (1.6 m)   Wt 164 lb (74.4 kg)   LMP 01/23/2013   SpO2 98%   BMI 29.05 kg/m  Vitals with BMI 02/07/2020 01/19/2020 10/25/2019  Height _1  _2  _3   Weight 164 lbs 168 lbs 174 lbs 13 oz  BMI 29.06 16.10 96.04  Systolic 540 981 191  Diastolic 70  87 99  Pulse 78 89 85  Some encounter information is confidential and restricted. Go to Review Flowsheets activity to see all data.     Physical Exam Vitals reviewed.  Constitutional:      General: She is not in acute distress.    Appearance: Normal appearance.  HENT:     Head: Normocephalic and atraumatic.  Neck:     Vascular: No carotid bruit.  Cardiovascular:     Rate and Rhythm: Normal rate and regular rhythm.     Pulses: Normal  pulses.     Heart sounds: Normal heart sounds.  Pulmonary:     Effort: Pulmonary effort is normal.     Breath sounds: Normal breath sounds.  Skin:    General: Skin is warm and dry.  Neurological:     General: No focal deficit present.     Mental Status: She is alert and oriented to person, place, and time.  Psychiatric:        Mood and Affect: Mood normal.        Behavior: Behavior normal.        Judgment: Judgment normal.      EKG: Normal sinus rhythm    Assessment and Plan   1. Essential hypertension, benign   2. Mixed hyperlipidemia   3. Hypothyroidism, unspecified type   4. Uncontrolled type 2 diabetes mellitus with hyperglycemia ([place])   5. Gastroesophageal reflux disease without esophagitis   6. Moderate persistent asthma, unspecified whether complicated   7. Vitamin D deficiency   8. Chest pain, unspecified type      Plan: 1.  Should continue on current medication as prescribed to control hypertension 2.  She continue on her atorvastatin aspirin for treatment of her hyperlipidemia 3.  We will check TSH level and will refill levothyroxine based on blood work results. 4.  She will continue to follow-up with Dr. Dorris Fetch I have refilled some of her medications per her request related to her diabetes today. 5.  She will follow-up with gastroenterology I have refilled medications per her request today. 6.  I will refill her Flovent and we did discuss the difference between maintenance and rescue inhalers.  I will prescribe albuterol  that she will use as rescue.  We also discussed the importance of cleaning her mouth out with water after using the Flovent.  She tells me she understands. 7.  We will check vitamin D and CMP today for further evaluation. 8.  It does appear this is related to anxiety, I encouraged her to follow-up with her psychiatrist and cardiologist as scheduled.     Tests ordered Orders Placed This Encounter  Procedures  . TSH  . Vitamin D, 25-hydroxy  . CMP with eGFR([facility])      Meds ordered this encounter  Medications  . atorvastatin (LIPITOR) 10 MG tablet    Sig: Take 1 tablet (10 mg total) by mouth at bedtime.    Dispense:  90 tablet    Refill:  1    Order Specific Question:   Supervising Provider    Answer:   Hurshel Party C [[id number]]  . carvedilol (COREG) 12.5 MG tablet    Sig: Take 1 tablet (12.5 mg total) by mouth 2 (two) times daily.    Dispense:  180 tablet    Refill:  1    Order Specific Question:   Supervising Provider    Answer:   Hurshel Party C [[id number]]  . DULoxetine (CYMBALTA) 60 MG capsule    Sig: Take 1 capsule (60 mg total) by mouth 2 (two) times daily.    Dispense:  60 capsule    Refill:  2    Order Specific Question:   Supervising Provider    Answer:   Hurshel Party C [[id number]]  . FLOVENT HFA 44 MCG/ACT inhaler    Sig: Inhale 2 puffs into the lungs in the morning and at bedtime.    Dispense:  1 each    Refill:  3    Order Specific Question:   Supervising Provider  AnswerAnastasio Champion, NIMISH C [[id number]]  . gabapentin (NEURONTIN) 300 MG capsule    Sig: Take 1 capsule (300 mg total) by mouth 3 (three) times daily.    Dispense:  90 capsule    Refill:  3    Order Specific Question:   Supervising Provider    Answer:   Hurshel Party C [[id number]]  . glipiZIDE (GLUCOTROL XL) 5 MG 24 hr tablet    Sig: Take 2 tablets (10 mg total) by mouth daily with breakfast.    Dispense:  180 tablet    Refill:  1    Order Specific Question:   Supervising Provider    Answer:   Hurshel Party C [[id number]]  . albuterol (VENTOLIN HFA) 108 (90 Base) MCG/ACT inhaler    Sig: Inhale 2 puffs into the lungs every 6 (six) hours as needed for wheezing or shortness of breath.    Dispense:  8 g    Refill:  0    Order Specific Question:   Supervising Provider    Answer:   Anastasio Champion, NIMISH C [[id number]]  . losartan (COZAAR) 50 MG tablet    Sig: Take 1 tablet (50 mg total) by mouth daily.    Dispense:  90 tablet    Refill:  1    Patient needs to be seen in the office before a 90-day supply can be prescribed.  Please advise the patient to make an appointment to be seen soon.  Thanks.    Order Specific Question:   Supervising Provider    Answer:   Doree Albee [[id number]]  . ondansetron (ZOFRAN) 4 MG tablet    Sig: Take 1 tablet (4 mg total) by mouth every 6 (six) hours. as needed for nausea or vomiting.    Dispense:  10 tablet    Refill:  2    Order Specific Question:   Supervising Provider    Answer:   Doree Albee [[id number]]    Patient to follow-up in 3 months for annual physical exam with Dr. Anastasio Champion.  Ailene Ards, NP

## 2020-02-08 ENCOUNTER — Telehealth (INDEPENDENT_AMBULATORY_CARE_PROVIDER_SITE_OTHER): Payer: Self-pay | Admitting: Nurse Practitioner

## 2020-02-08 ENCOUNTER — Other Ambulatory Visit (INDEPENDENT_AMBULATORY_CARE_PROVIDER_SITE_OTHER): Payer: Self-pay | Admitting: Nurse Practitioner

## 2020-02-08 ENCOUNTER — Emergency Department (HOSPITAL_COMMUNITY)
Admission: EM | Admit: 2020-02-08 | Discharge: 2020-02-08 | Disposition: A | Discharge status: Home / Self Care | Payer: Medicare Other | Attending: Emergency Medicine | Admitting: Emergency Medicine

## 2020-02-08 ENCOUNTER — Encounter (HOSPITAL_COMMUNITY): Payer: Self-pay

## 2020-02-08 ENCOUNTER — Other Ambulatory Visit: Payer: Self-pay

## 2020-02-08 DIAGNOSIS — R5383 Other fatigue: Secondary | ICD-10-CM | POA: Insufficient documentation

## 2020-02-08 DIAGNOSIS — E039 Hypothyroidism, unspecified: Secondary | ICD-10-CM | POA: Insufficient documentation

## 2020-02-08 DIAGNOSIS — Z79890 Hormone replacement therapy (postmenopausal): Secondary | ICD-10-CM | POA: Insufficient documentation

## 2020-02-08 DIAGNOSIS — J45909 Unspecified asthma, uncomplicated: Secondary | ICD-10-CM | POA: Diagnosis not present

## 2020-02-08 DIAGNOSIS — I10 Essential (primary) hypertension: Secondary | ICD-10-CM | POA: Diagnosis not present

## 2020-02-08 DIAGNOSIS — Z79899 Other long term (current) drug therapy: Secondary | ICD-10-CM | POA: Insufficient documentation

## 2020-02-08 DIAGNOSIS — Z7982 Long term (current) use of aspirin: Secondary | ICD-10-CM | POA: Diagnosis not present

## 2020-02-08 DIAGNOSIS — E1165 Type 2 diabetes mellitus with hyperglycemia: Secondary | ICD-10-CM | POA: Insufficient documentation

## 2020-02-08 DIAGNOSIS — Z794 Long term (current) use of insulin: Secondary | ICD-10-CM | POA: Diagnosis not present

## 2020-02-08 DIAGNOSIS — E1140 Type 2 diabetes mellitus with diabetic neuropathy, unspecified: Secondary | ICD-10-CM | POA: Diagnosis not present

## 2020-02-08 DIAGNOSIS — R739 Hyperglycemia, unspecified: Secondary | ICD-10-CM

## 2020-02-08 LAB — COMPLETE METABOLIC PANEL WITH GFR
AG Ratio: 1.5 (calc) (ref 1.0–2.5)
ALT: 13 U/L (ref 6–29)
AST: 7 U/L — ABNORMAL LOW (ref 10–35)
Albumin: 4.6 g/dL (ref 3.6–5.1)
Alkaline phosphatase (APISO): 103 U/L (ref 31–125)
BUN: 17 mg/dL (ref 7–25)
CO2: 29 mmol/L (ref 20–32)
Calcium: 9.9 mg/dL (ref 8.6–10.2)
Chloride: 95 mmol/L — ABNORMAL LOW (ref 98–110)
Creat: 1.03 mg/dL (ref 0.50–1.10)
GFR, Est African American: 74 mL/min/{1.73_m2} (ref >=60)
GFR, Est Non African American: 64 mL/min/{1.73_m2} (ref >=60)
Globulin: 3.1 g/dL (calc) (ref 1.9–3.7)
Glucose, Bld: 594 mg/dL (ref 65–99)
Potassium: 3.9 mmol/L (ref 3.5–5.3)
Sodium: 134 mmol/L — ABNORMAL LOW (ref 135–146)
Total Bilirubin: 0.7 mg/dL (ref 0.2–1.2)
Total Protein: 7.7 g/dL (ref 6.1–8.1)

## 2020-02-08 LAB — CBG MONITORING, ED
Glucose-Capillary: 389 mg/dL — ABNORMAL HIGH (ref 70–99)
Glucose-Capillary: 363 mg/dL — ABNORMAL HIGH (ref 70–99)
Glucose-Capillary: 295 mg/dL — ABNORMAL HIGH (ref 70–99)

## 2020-02-08 LAB — CBC WITH DIFFERENTIAL/PLATELET
Abs Immature Granulocytes: 0.02 10*3/uL (ref 0.00–0.07)
Basophils Absolute: 0.1 10*3/uL (ref 0.0–0.1)
Basophils Relative: 1 %
Eosinophils Absolute: 0.2 10*3/uL (ref 0.0–0.5)
Eosinophils Relative: 3 %
HCT: 37.8 % (ref 36.0–46.0)
Hemoglobin: 12.8 g/dL (ref 12.0–15.0)
Immature Granulocytes: 0 %
Lymphocytes Relative: 48 %
Lymphs Abs: 3.3 10*3/uL (ref 0.7–4.0)
MCH: 27.7 pg (ref 26.0–34.0)
MCHC: 33.9 g/dL (ref 30.0–36.0)
MCV: 81.8 fL (ref 80.0–100.0)
Monocytes Absolute: 0.5 10*3/uL (ref 0.1–1.0)
Monocytes Relative: 7 %
Neutro Abs: 2.7 10*3/uL (ref 1.7–7.7)
Neutrophils Relative %: 41 %
Platelets: 257 10*3/uL (ref 150–400)
RBC: 4.62 MIL/uL (ref 3.87–5.11)
RDW: 11.9 % (ref 11.5–15.5)
WBC: 6.7 10*3/uL (ref 4.0–10.5)
nRBC: 0 % (ref 0.0–0.2)

## 2020-02-08 LAB — COMPREHENSIVE METABOLIC PANEL
ALT: 14 U/L (ref 0–44)
AST: 11 U/L — ABNORMAL LOW (ref 15–41)
Albumin: 4.1 g/dL (ref 3.5–5.0)
Alkaline Phosphatase: 90 U/L (ref 38–126)
Anion gap: 10 (ref 5–15)
BUN: 16 mg/dL (ref 6–20)
CO2: 29 mmol/L (ref 22–32)
Calcium: 9.4 mg/dL (ref 8.9–10.3)
Chloride: 96 mmol/L — ABNORMAL LOW (ref 98–111)
Creatinine, Ser: 0.88 mg/dL (ref 0.44–1.00)
GFR calc Af Amer: >60 mL/min (ref >60)
GFR calc non Af Amer: >60 mL/min (ref >60)
Glucose, Bld: 427 mg/dL — ABNORMAL HIGH (ref 70–99)
Potassium: 3.7 mmol/L (ref 3.5–5.1)
Sodium: 135 mmol/L (ref 135–145)
Total Bilirubin: 1 mg/dL (ref 0.3–1.2)
Total Protein: 7.9 g/dL (ref 6.5–8.1)

## 2020-02-08 LAB — VITAMIN D 25 HYDROXY (VIT D DEFICIENCY, FRACTURES): Vit D, 25-Hydroxy: 32 ng/mL (ref 30–100)

## 2020-02-08 LAB — TSH: TSH: 1.26 mIU/L

## 2020-02-08 MED ORDER — SODIUM CHLORIDE 0.9 % IV BOLUS
1000.0000 mL | Freq: Once | INTRAVENOUS | Status: AC
  Administered 2020-02-08: 1000 mL via INTRAVENOUS

## 2020-02-08 MED ORDER — LEVOTHYROXINE SODIUM 75 MCG PO TABS: 75.0000 ug | ORAL_TABLET | Freq: Every day | ORAL | 1 refills | Status: DC

## 2020-02-08 MED ORDER — INSULIN ASPART 100 UNIT/ML ~~LOC~~ SOLN
5.0000 [IU] | Freq: Once | SUBCUTANEOUS | Status: AC
  Administered 2020-02-08: 5 [IU] via SUBCUTANEOUS
  Filled 2020-02-08: qty 1

## 2020-02-08 NOTE — ED Triage Notes (Signed)
Pt reports had blood work done yesterday and was told her glucose was 600.  Pt denies any symptoms.

## 2020-02-08 NOTE — Telephone Encounter (Signed)
This patient did call me back and I was able to tell her about her blood work results and that she should go to the emergency department immediately.  She tells me she understands and will do so.

## 2020-02-08 NOTE — Telephone Encounter (Signed)
Nellie/Karen, no additional intervention needed from either of you at this time. Thank you for your help.

## 2020-02-08 NOTE — Telephone Encounter (Signed)
I see she is already at [name]. Did you need me to do anything ?

## 2020-02-08 NOTE — ED Provider Notes (Signed)
Ultrasound ED Peripheral IV (Provider)  Date/Time: 02/08/2020 12:23 PM Performed by: Tedd Sias, PA Authorized by: Tedd Sias, PA   Procedure details:    Indications: hydration, multiple failed IV attempts and poor IV access     Skin Prep: isopropyl alcohol     Location: Right upper arm (above AC)   Angiocath:  20 G   Bedside Ultrasound Guided: Yes     Images: not archived     Patient tolerated procedure without complications: Yes     Dressing applied: Yes        Tedd Sias, PA 02/08/20 1223    Milton Ferguson, MD 02/08/20 1252

## 2020-02-08 NOTE — Telephone Encounter (Signed)
I attempted to call this patient and her husband was listed on her emergency contact list to discuss the critical value of her glucose.  I was unable to get a hold of either and left a voicemail asking them to call me back urgently.I then called her daughter Laray Anger who is also listed as an emergency contact in the patient's chart.  She did answer the phone and I did explain to her the patient has a critically high blood sugar level and this needs to be emergently addressed in the emergency department where she can be closely monitored.  The daughter tells me she will go to the patient's home and have her call this office as soon as she can.  Karen/Nellie, if this patient calls the office and I am with another patient please come get me so that I can discuss recommendations with this patient as soon as possible.

## 2020-02-08 NOTE — Discharge Instructions (Addendum)
Drink plenty of fluids and contact your family doctor today or tomorrow and see when they want to see you back to have your sugar checked again

## 2020-02-08 NOTE — ED Provider Notes (Signed)
[facility] EMERGENCY DEPARTMENT Provider Note   CSN: [id number] Arrival date & time: 02/08/20  3762     History Chief Complaint  Patient presents with  . Hyperglycemia    Tierra A Bova is a 49 y.o. female.    The was sent to the emergency department because her sugar yesterday was greater than 600.  Her doctor's office stated she needs some fluids.  She has been feeling fine  The history is provided by the patient. No language interpreter was used.  Hyperglycemia Blood sugar level PTA:  600 Severity:  Moderate Onset quality:  Sudden Timing:  Constant Progression:  Waxing and waning Chronicity:  New Diabetes status:  Controlled with insulin Context: not change in medication   Associated symptoms: fatigue   Associated symptoms: no abdominal pain and no chest pain        Past Medical History:  Diagnosis Date  . Anemia   . Anxiety   . Asthma   . Depression   . GERD (gastroesophageal reflux disease)   . HLD (hyperlipidemia) 04/07/2019  . Hypertension   . Hypothyroidism, adult 04/07/2019  . Neuropathy   . Obesity (BMI 30.0-34.9) 04/07/2019  . Schizoaffective disorder ([place])   . Type II diabetes mellitus, uncontrolled ([place]) 04/27/2019    Patient Active Problem List   Diagnosis Date Noted  . Vitamin D deficiency 07/15/2019  . Type II diabetes mellitus, uncontrolled ([place]) 04/27/2019  . Hypothyroidism 04/07/2019  . Obesity (BMI 30.0-34.9) 04/07/2019  . Mixed hyperlipidemia 04/07/2019  . Abnormal esophagram 11/23/2018  . GERD (gastroesophageal reflux disease) 10/08/2015  . IBS (irritable colon syndrome) 10/08/2015  . Essential hypertension, benign 08/22/2015  . Schizoaffective disorder ([place]) 02/08/2015  . PTSD (post-traumatic stress disorder) 02/08/2015    Past Surgical History:  Procedure Laterality Date  . BREAST SURGERY Right    biopsy  . CESAREAN SECTION    . CHOLECYSTECTOMY  06/02/2012   Procedure: LAPAROSCOPIC CHOLECYSTECTOMY;  Surgeon: Jamesetta So,  MD;  Location: [facility] ORS;  Service: General;  Laterality: N/A;  Attempted laparoscopic cholecystectomy  . CHOLECYSTECTOMY  06/02/2012   Procedure: CHOLECYSTECTOMY;  Surgeon: Jamesetta So, MD;  Location: [facility] ORS;  Service: General;  Laterality: N/A;  converted to open at  0905  . ESOPHAGEAL DILATION  12/31/2018   Procedure: ESOPHAGEAL DILATION;  Surgeon: Rogene Houston, MD;  Location: [facility] ENDO SUITE;  Service: Endoscopy;;  . ESOPHAGOGASTRODUODENOSCOPY (EGD) WITH PROPOFOL N/A 08/24/2015   Procedure: ESOPHAGOGASTRODUODENOSCOPY (EGD) WITH PROPOFOL;  Surgeon: Rogene Houston, MD;  Location: [facility] ENDO SUITE;  Service: Endoscopy;  Laterality: N/A;  1:10 - Ann to notify pt to arrive at 11:30  . ESOPHAGOGASTRODUODENOSCOPY (EGD) WITH PROPOFOL N/A 12/31/2018   Procedure: ESOPHAGOGASTRODUODENOSCOPY (EGD) WITH PROPOFOL;  Surgeon: Rogene Houston, MD;  Location: [facility] END[facility] SUITE;  Service: Endoscopy;  Laterality: N/A;  . tooth removal  2019   all teeth removed  . TUBAL LIGATION     X3     OB History    Gravida  3   Para  3   Term  3   Preterm      AB      Living  3     SAB      TAB      Ectopic      Multiple      Live Births              Family History  Problem Relation Age of Onset  . Hypertension Mother   .  Alcohol abuse Mother   . Heart disease Mother   . Kidney disease Mother   . Hypertension Father   . Alcohol abuse Sister   . Stroke Other   . Diabetes Other   . Cancer Other   . Seizures Other   . Alcohol abuse Maternal Aunt   . Alcohol abuse Paternal Aunt   . Alcohol abuse Maternal Grandfather   . Alcohol abuse Maternal Grandmother   . Alcohol abuse Cousin   . Hearing loss Daughter     Social History   Tobacco Use  . Smoking status: Never Smoker  . Smokeless tobacco: Never Used  Vaping Use  . Vaping Use: Never used  Substance Use Topics  . Alcohol use: No    Alcohol/week: 0.0 standard drinks  . Drug use: No    Home Medications Prior to Admission medications    Medication Sig Start Date End Date Taking? Authorizing Provider  acetaminophen-codeine (TYLENOL #3) 300-30 MG tablet Take 1 tablet by mouth every 6 (six) hours as needed for moderate pain.  10/17/19  Yes [provider]  albuterol (VENTOLIN HFA) 108 (90 Base) MCG/ACT inhaler Inhale 2 puffs into the lungs every 6 (six) hours as needed for wheezing or shortness of breath. Patient taking differently: Inhale 2 puffs into the lungs daily.  02/07/20  Yes Ailene Ards, NP  aspirin EC 81 MG tablet Take 81 mg by mouth daily.   Yes [provider]  atorvastatin (LIPITOR) 10 MG tablet Take 1 tablet (10 mg total) by mouth at bedtime. 02/07/20  Yes Ailene Ards, NP  benztropine (COGENTIN) 1 MG tablet TAKE 1 TABLET(1 MG) BY MOUTH AT BEDTIME Patient taking differently: Take 1 mg by mouth at bedtime.  12/30/19  Yes Cloria Spring, MD  carvedilol (COREG) 12.5 MG tablet Take 1 tablet (12.5 mg total) by mouth 2 (two) times daily. 02/07/20  Yes Ailene Ards, NP  Cholecalciferol (VITAMIN D-3) 125 MCG (5000 UT) TABS Take 2 tablets by mouth daily. 10/13/19  Yes [name], Marella Chimes, MD  dicyclomine (BENTYL) 10 MG capsule TAKE 1 CAPSULE(10 MG) BY MOUTH THREE TIMES DAILY BEFORE MEALS Patient taking differently: Take 10 mg by mouth 3 (three) times daily before meals.  11/14/19  Yes Ailene Ards, NP  DULoxetine (CYMBALTA) 60 MG capsule Take 1 capsule (60 mg total) by mouth 2 (two) times daily. 02/07/20  Yes Ailene Ards, NP  Exenatide ER (BYDUREON BCISE) 2 MG/0.85ML AUIJ Inject 2 mg into the skin every Monday. 10/17/19 02/08/20 Yes [name], Marella Chimes, MD  famotidine (PEPCID) 20 MG tablet Take 1 tablet (20 mg total) by mouth 2 (two) times daily. 07/15/19  Yes Ailene Ards, NP  gabapentin (NEURONTIN) 300 MG capsule Take 1 capsule (300 mg total) by mouth 3 (three) times daily. 02/07/20 03/08/20 Yes Ailene Ards, NP  glipiZIDE (GLUCOTROL XL) 5 MG 24 hr tablet Take 2 tablets (10 mg total) by mouth daily with  breakfast. 02/07/20  Yes Ailene Ards, NP  HUMALOG KWIKPEN 100 UNIT/ML KwikPen Inject 100 Units into the skin daily as needed (Sugar).  11/15/19  Yes [provider]  insulin glargine, 2 Unit Dial, (TOUJEO MAX SOLOSTAR) 300 UNIT/ML Solostar Pen Inject 80 Units into the skin daily. 10/13/19  Yes Cassandria Anger, MD  levothyroxine (SYNTHROID) 75 MCG tablet Take 1 tablet (75 mcg total) by mouth daily before breakfast. 02/08/20  Yes Ailene Ards, NP  linaclotide Rolan Lipa) 290 MCG CAPS capsule Take 1  capsule (290 mcg total) by mouth daily before breakfast. 01/19/20  Yes Minus Liberty, PA-C  promethazine (PHENERGAN) 25 MG tablet Take 25 mg by mouth every 6 (six) hours as needed for nausea or vomiting.  12/14/19  Yes [provider]  risperiDONE (RISPERDAL) 2 MG tablet Take one tablet qam three tablets at bedtime Patient taking differently: Take 2-6 mg by mouth See admin instructions. Take one tablet every morning and three tablets at bedtime 12/30/19  Yes Cloria Spring, MD  traZODone (DESYREL) 100 MG tablet TAKE 2 TABLETS(200 MG) BY MOUTH AT BEDTIME Patient taking differently: Take 200 mg by mouth at bedtime.  12/30/19  Yes Cloria Spring, MD  FLOVENT HFA 44 MCG/ACT inhaler Inhale 2 puffs into the lungs in the morning and at bedtime. Patient not taking: Reported on 02/08/2020 02/07/20   Ailene Ards, NP  glucose blood (ACCU-CHEK GUIDE) test strip Use as instructed 10/13/19   Cassandria Anger, MD  losartan (COZAAR) 50 MG tablet Take 1 tablet (50 mg total) by mouth daily. Patient not taking: Reported on 02/08/2020 02/07/20   Ailene Ards, NP  ondansetron (ZOFRAN) 4 MG tablet Take 1 tablet (4 mg total) by mouth every 6 (six) hours. as needed for nausea or vomiting. Patient not taking: Reported on 02/08/2020 02/07/20   Ailene Ards, NP  pantoprazole (PROTONIX) 40 MG tablet Take 1 tablet (40 mg total) by mouth 2 (two) times daily before a meal. Patient not taking: Reported on 02/08/2020  01/19/20   Laurine Blazer A, PA-C  atorvastatin (LIPITOR) 10 MG tablet Take 1 tablet (10 mg total) by mouth daily. 07/15/19   Ailene Ards, NP  BYDUREON BCISE 2 MG/0.85ML AUIJ Inject 2 mg into the skin every Monday.  08/12/16   [provider]  carvedilol (COREG) 12.5 MG tablet Take 12.5 mg by mouth 2 (two) times daily with a meal.     [provider]  carvedilol (COREG) 12.5 MG tablet TAKE 1 TABLET BY MOUTH TWICE DAILY Patient taking differently: Take 12.5 mg by mouth in the morning and at bedtime.  07/15/19   Ailene Ards, NP  gabapentin (NEURONTIN) 300 MG capsule Take 300 mg by mouth 3 (three) times daily.  03/28/15   [provider]  gabapentin (NEURONTIN) 300 MG capsule TAKE 1 CAPSULE BY MOUTH THREE TIMES DAILY 03/11/19   Gosrani, Nimish C, MD  JANUVIA 100 MG tablet Take 100 mg by mouth daily. 10/28/18   [provider]  LINZESS 145 MCG CAPS capsule Take 145 mcg by mouth daily before breakfast.  02/04/17   [provider]  losartan (COZAAR) 25 MG tablet Take 25 mg daily by mouth.    [provider]  losartan (COZAAR) 50 MG tablet Take 1 tablet (50 mg total) by mouth daily. 04/27/19   Doree Albee, MD  losartan (COZAAR) 50 MG tablet TAKE 1 TABLET BY MOUTH DAILY Patient taking differently: Take 50 mg by mouth daily.  07/08/19   Doree Albee, MD  omeprazole (PRILOSEC) 20 MG capsule Take 1 capsule (20 mg total) by mouth daily before supper. 04/08/16   Rogene Houston, MD  omeprazole (PRILOSEC) 20 MG capsule TAKE 1 CAPSULE BY MOUTH EVERY DAY 07/15/19   Ailene Ards, NP    Allergies    Patient has no known allergies.  Review of Systems   Review of Systems  Constitutional: Positive for fatigue. Negative for appetite change.  HENT: Negative for congestion, ear discharge  and sinus pressure.   Eyes: Negative for discharge.  Respiratory: Negative for cough.   Cardiovascular: Negative for chest pain.  Gastrointestinal: Negative for  abdominal pain and diarrhea.  Genitourinary: Negative for frequency and hematuria.  Musculoskeletal: Negative for back pain.  Skin: Negative for rash.  Neurological: Negative for seizures and headaches.  Psychiatric/Behavioral: Negative for hallucinations.    Physical Exam Updated Vital Signs BP (!) 142/86 (BP Location: Right Arm)   Pulse 98   Temp 98.9 F (37.2 C) (Oral)   Resp 18   Ht 5\' 3"  (1.6 m)   Wt 69.9 kg   LMP 01/23/2013   SpO2 96%   BMI 27.28 kg/m   Physical Exam Vitals and nursing note reviewed.  Constitutional:      Appearance: She is well-developed.  HENT:     Head: Normocephalic.     Nose: Nose normal.  Eyes:     General: No scleral icterus.    Conjunctiva/sclera: Conjunctivae normal.  Neck:     Thyroid: No thyromegaly.  Cardiovascular:     Rate and Rhythm: Normal rate and regular rhythm.     Heart sounds: No murmur heard.  No friction rub. No gallop.   Pulmonary:     Breath sounds: No stridor. No wheezing or rales.  Chest:     Chest wall: No tenderness.  Abdominal:     General: There is no distension.     Tenderness: There is no abdominal tenderness. There is no rebound.  Musculoskeletal:        General: Normal range of motion.     Cervical back: Neck supple.  Lymphadenopathy:     Cervical: No cervical adenopathy.  Skin:    Findings: No erythema or rash.  Neurological:     Mental Status: She is alert and oriented to person, place, and time.     Motor: No abnormal muscle tone.     Coordination: Coordination normal.  Psychiatric:        Behavior: Behavior normal.     ED Results / Procedures / Treatments   Labs (all labs ordered are listed, but only abnormal results are displayed) Labs Reviewed  COMPREHENSIVE METABOLIC PANEL - Abnormal; Notable for the following components:      Result Value   Chloride 96 (*)    Glucose, Bld 427 (*)    AST 11 (*)    All other components within normal limits  CBG MONITORING, ED - Abnormal; Notable for  the following components:   Glucose-Capillary 389 (*)    All other components within normal limits  CBG MONITORING, ED - Abnormal; Notable for the following components:   Glucose-Capillary 363 (*)    All other components within normal limits  CBG MONITORING, ED - Abnormal; Notable for the following components:   Glucose-Capillary 295 (*)    All other components within normal limits  CBC WITH DIFFERENTIAL/PLATELET    EKG None  Radiology No results found.  Procedures Procedures (including critical care time)  Medications Ordered in ED Medications  sodium chloride 0.9 % bolus 1,000 mL (1,000 mLs Intravenous New Bag/Given 02/08/20 1213)  insulin aspart (novoLOG) injection 5 Units (5 Units Subcutaneous Given 02/08/20 1214)    ED Course  I have reviewed the triage vital signs and the nursing notes.  Pertinent labs & imaging results that were available during my care of the patient were reviewed by me and considered in my medical decision making (see chart for details).    MDM Rules/Calculators/A&P  Patient with hyperglycemia.  Patient improved with fluids and insulin she will follow up with her PCP     This patient presents to the ED for concern of elevated glucose, this involves an extensive number of treatment options, and is a complaint that carries with it a high risk of complications and morbidity.  The differential diagnosis includes poorly controlled diabetes   Lab Tests:   I Ordered, reviewed, and interpreted labs, which included CBC and chemistries which showed normal CBC with glucose of 400  Medicines ordered:   I ordered medication normal saline and insulin  Imaging Studies ordered:     Additional history obtained:   Additional history obtained from records  Previous records obtained and reviewed.  Consultations Obtained:     Reevaluation:  After the interventions stated above, I reevaluated the patient and found  improved  Critical Interventions:  .   Final Clinical Impression(s) / ED Diagnoses Final diagnoses:  Hyperglycemia    Rx / DC Orders ED Discharge Orders    None       Milton Ferguson, MD 02/08/20 1812

## 2020-02-09 ENCOUNTER — Other Ambulatory Visit: Payer: Self-pay | Admitting: "Endocrinology

## 2020-02-14 ENCOUNTER — Other Ambulatory Visit (INDEPENDENT_AMBULATORY_CARE_PROVIDER_SITE_OTHER): Payer: Self-pay | Admitting: Internal Medicine

## 2020-02-29 NOTE — Progress Notes (Deleted)
Patient profile: Kathryn [name] is a 49 y.o. female seen for follow-up.  Last seen August 2020 for endoscopy for chronic GERD and dysphagia.  History of Present Illness: Kathryn [name] is seen today for   Wt Readings from Last 3 Encounters:  02/08/20 154 lb (69.9 kg)  02/07/20 164 lb (74.4 kg)  01/19/20 168 lb (76.2 kg)     Last Colonoscopy: none on file Last Endoscopy: 12/2018- Normal hypopharynx and laryngeal inlet. - Normal esophagus. - Z-line regular, 38 cm from the incisors. - No endoscopic esophageal abnormality to explain patient's dysphagia. Esophagus dilated. Dilated. - Normal stomach. - Normal duodenal bulb and second portion of the duodenum. - No specimens collected.    Past Medical History:  Past Medical History:  Diagnosis Date  . Anemia   . Anxiety   . Asthma   . Depression   . GERD (gastroesophageal reflux disease)   . HLD (hyperlipidemia) 04/07/2019  . Hypertension   . Hypothyroidism, adult 04/07/2019  . Neuropathy   . Obesity (BMI 30.0-34.9) 04/07/2019  . Schizoaffective disorder ([place])   . Type II diabetes mellitus, uncontrolled ([place]) 04/27/2019    Problem List: Patient Active Problem List   Diagnosis Date Noted  . Vitamin D deficiency 07/15/2019  . Type II diabetes mellitus, uncontrolled ([place]) 04/27/2019  . Hypothyroidism 04/07/2019  . Obesity (BMI 30.0-34.9) 04/07/2019  . Mixed hyperlipidemia 04/07/2019  . Abnormal esophagram 11/23/2018  . GERD (gastroesophageal reflux disease) 10/08/2015  . IBS (irritable colon syndrome) 10/08/2015  . Essential hypertension, benign 08/22/2015  . Schizoaffective disorder ([place]) 02/08/2015  . PTSD (post-traumatic stress disorder) 02/08/2015    Past Surgical History: Past Surgical History:  Procedure Laterality Date  . BREAST SURGERY Right    biopsy  . CESAREAN SECTION    . CHOLECYSTECTOMY  06/02/2012   Procedure: LAPAROSCOPIC CHOLECYSTECTOMY;  Surgeon: Jamesetta So, MD;  Location: [facility];  Service:  General;  Laterality: N/A;  Attempted laparoscopic cholecystectomy  . CHOLECYSTECTOMY  06/02/2012   Procedure: CHOLECYSTECTOMY;  Surgeon: Jamesetta So, MD;  Location: [facility];  Service: General;  Laterality: N/A;  converted to open at  0905  . ESOPHAGEAL DILATION  12/31/2018   Procedure: ESOPHAGEAL DILATION;  Surgeon: Rogene Houston, MD;  Location[facility];  Service: Endoscopy;;  . ESOPHAGOGASTRODUODENOSCOPY (EGD) WITH PROPOFOL N/A 08/24/2015   Procedure: ESOPHAGOGASTRODUODENOSCOPY (EGD) WITH PROPOFOL;  Surgeon: Rogene Houston, MD;  [facility];  Service: Endoscopy;  Laterality: N/A;  1:10 - Ann to notify pt to arrive at 11:30  . ESOPHAGOGASTRODUODENOSCOPY (EGD) WITH PROPOFOL N/A 12/31/2018   Procedure: ESOPHAGOGASTRODUODENOSCOPY (EGD) WITH PROPOFOL;  Surgeon: Rogene Houston, MD;  [facility];  Service: Endoscopy;  Laterality: N/A;  . tooth removal  2019   all teeth removed  . TUBAL LIGATION     X3    Allergies: No Known Allergies    Home Medications:  Current Outpatient Medications:  .  acetaminophen-codeine (TYLENOL #3) 300-30 MG tablet, Take 1 tablet by mouth every 6 (six) hours as needed for moderate pain. , Disp: , Rfl:  .  albuterol (VENTOLIN HFA) 108 (90 Base) MCG/ACT inhaler, Inhale 2 puffs into the lungs every 6 (six) hours as needed for wheezing or shortness of breath. (Patient taking differently: Inhale 2 puffs into the lungs daily. ), Disp: 8 g, Rfl: 0 .  aspirin EC 81 MG tablet, Take 81 mg by mouth daily., Disp: , Rfl:  .  atorvastatin (LIPITOR) 10 MG  tablet, Take 1 tablet (10 mg total) by mouth at bedtime., Disp: 90 tablet, Rfl: 1 .  benztropine (COGENTIN) 1 MG tablet, TAKE 1 TABLET(1 MG) BY MOUTH AT BEDTIME (Patient taking differently: Take 1 mg by mouth at bedtime. ), Disp: 30 tablet, Rfl: 2 .  carvedilol (COREG) 12.5 MG tablet, Take 1 tablet (12.5 mg total) by mouth 2 (two) times daily., Disp: 180 tablet, Rfl: 1 .  Cholecalciferol (VITAMIN D-3)  125 MCG (5000 UT) TABS, Take 2 tablets by mouth daily., Disp: 30 tablet, Rfl: 1 .  dicyclomine (BENTYL) 10 MG capsule, TAKE 1 CAPSULE(10 MG) BY MOUTH THREE TIMES DAILY BEFORE MEALS (Patient taking differently: Take 10 mg by mouth 3 (three) times daily before meals. ), Disp: 90 capsule, Rfl: 2 .  DULoxetine (CYMBALTA) 60 MG capsule, Take 1 capsule (60 mg total) by mouth 2 (two) times daily., Disp: 60 capsule, Rfl: 2 .  Exenatide ER (BYDUREON BCISE) 2 MG/0.85ML AUIJ, Inject 2 mg into the skin every Monday., Disp: 4 pen, Rfl: 2 .  famotidine (PEPCID) 20 MG tablet, Take 1 tablet (20 mg total) by mouth 2 (two) times daily., Disp: 60 tablet, Rfl: 2 .  FLOVENT HFA 44 MCG/ACT inhaler, Inhale 2 puffs into the lungs in the morning and at bedtime. (Patient not taking: Reported on 02/08/2020), Disp: 1 each, Rfl: 3 .  gabapentin (NEURONTIN) 300 MG capsule, Take 1 capsule (300 mg total) by mouth 3 (three) times daily., Disp: 90 capsule, Rfl: 3 .  glipiZIDE (GLUCOTROL XL) 5 MG 24 hr tablet, Take 2 tablets (10 mg total) by mouth daily with breakfast., Disp: 180 tablet, Rfl: 1 .  glucose blood (ACCU-CHEK GUIDE) test strip, USE TO CHECK BLOOD SUGAR TWO TIMES DAILY, Disp: 150 strip, Rfl: 0 .  HUMALOG KWIKPEN 100 UNIT/ML KwikPen, INJECT 10 TO 16 UNITS INTO THE SKIN THREE TIMES DAILY, Disp: 15 mL, Rfl: 1 .  insulin glargine, 2 Unit Dial, (TOUJEO MAX SOLOSTAR) 300 UNIT/ML Solostar Pen, Inject 80 Units into the skin daily., Disp: 4 pen, Rfl: 2 .  levothyroxine (SYNTHROID) 75 MCG tablet, Take 1 tablet (75 mcg total) by mouth daily before breakfast., Disp: 90 tablet, Rfl: 1 .  linaclotide (LINZESS) 290 MCG CAPS capsule, Take 1 capsule (290 mcg total) by mouth daily before breakfast., Disp: 30 capsule, Rfl: 1 .  losartan (COZAAR) 50 MG tablet, Take 1 tablet (50 mg total) by mouth daily. (Patient not taking: Reported on 02/08/2020), Disp: 90 tablet, Rfl: 1 .  ondansetron (ZOFRAN) 4 MG tablet, Take 1 tablet (4 mg total) by mouth  every 6 (six) hours. as needed for nausea or vomiting. (Patient not taking: Reported on 02/08/2020), Disp: 10 tablet, Rfl: 2 .  pantoprazole (PROTONIX) 40 MG tablet, Take 1 tablet (40 mg total) by mouth 2 (two) times daily before a meal. (Patient not taking: Reported on 02/08/2020), Disp: 60 tablet, Rfl: 3 .  promethazine (PHENERGAN) 25 MG tablet, Take 25 mg by mouth every 6 (six) hours as needed for nausea or vomiting. , Disp: , Rfl:  .  risperiDONE (RISPERDAL) 2 MG tablet, Take one tablet qam three tablets at bedtime (Patient taking differently: Take 2-6 mg by mouth See admin instructions. Take one tablet every morning and three tablets at bedtime), Disp: 120 tablet, Rfl: 2 .  traZODone (DESYREL) 100 MG tablet, TAKE 2 TABLETS(200 MG) BY MOUTH AT BEDTIME (Patient taking differently: Take 200 mg by mouth at bedtime. ), Disp: 60 tablet, Rfl: 2   Family History: family history  includes Alcohol abuse in her cousin, maternal aunt, maternal grandfather, maternal grandmother, mother, paternal aunt, and sister; Cancer in an other family member; Diabetes in an other family member; Hearing loss in her daughter; Heart disease in her mother; Hypertension in her father and mother; Kidney disease in her mother; Seizures in an other family member; Stroke in an other family member.    Social History:   reports that she has never smoked. She has never used smokeless tobacco. She reports that she does not drink alcohol and does not use drugs.   Review of Systems: Constitutional: Denies weight loss/weight gain  Eyes: No changes in vision. ENT: No oral lesions, sore throat.  GI: see HPI.  Heme/Lymph: No easy bruising.  CV: No chest pain.  GU: No hematuria.  Integumentary: No rashes.  Neuro: No headaches.  Psych: No depression/anxiety.  Endocrine: No heat/cold intolerance.  Allergic/Immunologic: No urticaria.  Resp: No cough, SOB.  Musculoskeletal: No joint swelling.    Physical Examination: LMP 01/23/2013    Gen: NAD, alert and oriented x 4 HEENT: PEERLA, EOMI, Neck: supple, no JVD Chest: CTA bilaterally, no wheezes, crackles, or other adventitious sounds CV: RRR, no m/g/c/r Abd: soft, NT, ND, +BS in all four quadrants; no HSM, guarding, ridigity, or rebound tenderness Ext: no edema, well perfused with 2+ pulses, Skin: no rash or lesions noted on observed skin Lymph: no noted LAD  Data Reviewed:   02/08/20-glucose 427, otherwise CMP normal, CBC normal. Vit D normal.   10/2019-A1c 10.9  Assessment/Plan: Ms. Tello is a 49 y.o. female    There are no diagnoses linked to this encounter.   Recommendations:   I personally performed the service, non-incident to. (WP)  Laurine Blazer, [facility]

## 2020-03-01 ENCOUNTER — Telehealth (INDEPENDENT_AMBULATORY_CARE_PROVIDER_SITE_OTHER): Payer: Self-pay

## 2020-03-01 ENCOUNTER — Ambulatory Visit (INDEPENDENT_AMBULATORY_CARE_PROVIDER_SITE_OTHER): Payer: Medicare Other | Admitting: Gastroenterology

## 2020-03-01 NOTE — Telephone Encounter (Signed)
noshow for Kathryn [name], [place] on 03/01/20

## 2020-03-03 ENCOUNTER — Other Ambulatory Visit (INDEPENDENT_AMBULATORY_CARE_PROVIDER_SITE_OTHER): Payer: Self-pay | Admitting: Nurse Practitioner

## 2020-03-03 DIAGNOSIS — K580 Irritable bowel syndrome with diarrhea: Secondary | ICD-10-CM

## 2020-03-11 ENCOUNTER — Other Ambulatory Visit (INDEPENDENT_AMBULATORY_CARE_PROVIDER_SITE_OTHER): Payer: Self-pay | Admitting: Nurse Practitioner

## 2020-03-11 DIAGNOSIS — E782 Mixed hyperlipidemia: Secondary | ICD-10-CM

## 2020-03-11 DIAGNOSIS — J4540 Moderate persistent asthma, uncomplicated: Secondary | ICD-10-CM

## 2020-03-11 DIAGNOSIS — K219 Gastro-esophageal reflux disease without esophagitis: Secondary | ICD-10-CM

## 2020-03-11 DIAGNOSIS — E1165 Type 2 diabetes mellitus with hyperglycemia: Secondary | ICD-10-CM

## 2020-03-11 DIAGNOSIS — E039 Hypothyroidism, unspecified: Secondary | ICD-10-CM

## 2020-03-11 DIAGNOSIS — I10 Essential (primary) hypertension: Secondary | ICD-10-CM

## 2020-03-13 ENCOUNTER — Telehealth: Payer: Self-pay

## 2020-03-13 NOTE — Telephone Encounter (Signed)
Received patient won't qualify as she injects once daily and tests twice daily.

## 2020-03-13 NOTE — Telephone Encounter (Signed)
[employer] [facility] left a VM checking to see if we received a form for her glucose monitor. If not call back at [phone]

## 2020-03-15 ENCOUNTER — Ambulatory Visit (INDEPENDENT_AMBULATORY_CARE_PROVIDER_SITE_OTHER): Payer: Medicare Other

## 2020-03-15 ENCOUNTER — Other Ambulatory Visit: Payer: Self-pay

## 2020-03-15 DIAGNOSIS — Z23 Encounter for immunization: Secondary | ICD-10-CM

## 2020-03-15 DIAGNOSIS — E119 Type 2 diabetes mellitus without complications: Secondary | ICD-10-CM | POA: Diagnosis not present

## 2020-03-15 DIAGNOSIS — H04123 Dry eye syndrome of bilateral lacrimal glands: Secondary | ICD-10-CM | POA: Diagnosis not present

## 2020-03-15 DIAGNOSIS — H2513 Age-related nuclear cataract, bilateral: Secondary | ICD-10-CM | POA: Diagnosis not present

## 2020-03-15 NOTE — Progress Notes (Signed)
Gave patient her regular quad flu vaccine in her Left Deltoid.  Patient tolerated the injection well.

## 2020-03-23 ENCOUNTER — Other Ambulatory Visit (INDEPENDENT_AMBULATORY_CARE_PROVIDER_SITE_OTHER): Payer: Self-pay | Admitting: Gastroenterology

## 2020-03-27 ENCOUNTER — Encounter (HOSPITAL_COMMUNITY): Payer: Self-pay | Admitting: Psychiatry

## 2020-03-27 ENCOUNTER — Telehealth (INDEPENDENT_AMBULATORY_CARE_PROVIDER_SITE_OTHER): Payer: Self-pay | Admitting: Psychiatry

## 2020-03-27 ENCOUNTER — Other Ambulatory Visit: Payer: Self-pay

## 2020-03-27 DIAGNOSIS — I10 Essential (primary) hypertension: Secondary | ICD-10-CM

## 2020-03-27 DIAGNOSIS — E782 Mixed hyperlipidemia: Secondary | ICD-10-CM

## 2020-03-27 DIAGNOSIS — J4540 Moderate persistent asthma, uncomplicated: Secondary | ICD-10-CM

## 2020-03-27 DIAGNOSIS — F4310 Post-traumatic stress disorder, unspecified: Secondary | ICD-10-CM

## 2020-03-27 DIAGNOSIS — F251 Schizoaffective disorder, depressive type: Secondary | ICD-10-CM

## 2020-03-27 DIAGNOSIS — K219 Gastro-esophageal reflux disease without esophagitis: Secondary | ICD-10-CM

## 2020-03-27 DIAGNOSIS — E039 Hypothyroidism, unspecified: Secondary | ICD-10-CM

## 2020-03-27 DIAGNOSIS — E1165 Type 2 diabetes mellitus with hyperglycemia: Secondary | ICD-10-CM

## 2020-03-27 MED ORDER — RISPERIDONE 2 MG PO TABS
ORAL_TABLET | ORAL | 2 refills | Status: DC
Start: 2020-03-27 — End: 2020-07-06

## 2020-03-27 MED ORDER — TRAZODONE HCL 100 MG PO TABS
ORAL_TABLET | ORAL | 2 refills | Status: DC
Start: 2020-03-27 — End: 2020-07-06

## 2020-03-27 MED ORDER — DULOXETINE HCL 60 MG PO CPEP: 60.0000 mg | ORAL_CAPSULE | Freq: Two times a day (BID) | ORAL | 2 refills | Status: DC

## 2020-03-27 MED ORDER — BENZTROPINE MESYLATE 1 MG PO TABS
ORAL_TABLET | ORAL | 2 refills | Status: DC
Start: 2020-03-27 — End: 2020-07-06

## 2020-03-27 MED ORDER — CLONAZEPAM 0.5 MG PO TABS: 0.5000 mg | ORAL_TABLET | Freq: Two times a day (BID) | ORAL | 2 refills | Status: DC | PRN

## 2020-03-27 NOTE — Progress Notes (Signed)
Virtual Visit via Telephone Note  I connected with Kathryn [name] on 03/27/20 at  9:40 AM EDT by telephone and verified that I am speaking with the correct person using two identifiers.  Location: Patient: home Provider: office   I discussed the limitations, risks, security and privacy concerns of performing an evaluation and management service by telephone and the availability of in person appointments. I also discussed with the patient that there may be a patient responsible charge related to this service. The patient expressed understanding and agreed to proceed.     I discussed the assessment and treatment plan with the patient. The patient was provided an opportunity to ask questions and all were answered. The patient agreed with the plan and demonstrated an understanding of the instructions.   The patient was advised to call back or seek an in-person evaluation if the symptoms worsen or if the condition fails to improve as anticipated.  I provided 15 minutes of non-face-to-face time during this encounter.   Levonne Spiller, MD  [facility] MD/PA/NP OP Progress Note  03/27/2020 9:55 AM Kathryn [name]  MRN:  [id number]  Chief Complaint:  Chief Complaint    Schizophrenia; Depression; Follow-up     HPI: This patient is a 49 year old separated black female who lives with her daughter in [place].  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  The patient returns for follow-up after 3 months.  She continues to work in [employer] and is joined and has made new friends.  She is also really working hard on her diabetic management.  She has lost a lot of weight and is down to 138 pounds by her report.  She states her blood sugars are running in the 100s now.  Unfortunately back in September she had to go to the emergency room because her sugar got over 600.  She states that her mood is generally good.  She occasionally hears voices but she tries to  ignore them but staying busy.  She denies being depressed.  She states that a few days ago she had a panic attack while sitting in the car with her husband.  She states they were talking about anything stressful.  I asked her she thinks it was a panic attack or low sugar and she is not entirely sure.  I suggested that I add some clonazepam that she can use as needed and she agrees. Visit Diagnosis:    ICD-10-CM   1. Schizoaffective disorder, depressive type ([place])  F25.1   2. Mixed hyperlipidemia  E78.2 DULoxetine (CYMBALTA) 60 MG capsule  3. Essential hypertension, benign  I10 DULoxetine (CYMBALTA) 60 MG capsule  4. Hypothyroidism, unspecified type  E03.9 DULoxetine (CYMBALTA) 60 MG capsule  5. Uncontrolled type 2 diabetes mellitus with hyperglycemia (HCC)  E11.65 DULoxetine (CYMBALTA) 60 MG capsule  6. Gastroesophageal reflux disease without esophagitis  K21.9 DULoxetine (CYMBALTA) 60 MG capsule  7. Moderate persistent asthma, unspecified whether complicated  D78.24 DULoxetine (CYMBALTA) 60 MG capsule  8. PTSD (post-traumatic stress disorder)  F43.10     Past Psychiatric History: 2 previous psychiatric hospitalizations for schizoaffective disorder  Past Medical History:  Past Medical History:  Diagnosis Date  . Anemia   . Anxiety   . Asthma   . Depression   . GERD (gastroesophageal reflux disease)   . HLD (hyperlipidemia) 04/07/2019  . Hypertension   . Hypothyroidism, adult 04/07/2019  . Neuropathy   . Obesity (BMI 30.0-34.9)  04/07/2019  . Schizoaffective disorder ([place])   . Type II diabetes mellitus, uncontrolled ([place]) 04/27/2019    Past Surgical History:  Procedure Laterality Date  . BREAST SURGERY Right    biopsy  . CESAREAN SECTION    . CHOLECYSTECTOMY  06/02/2012   Procedure: LAPAROSCOPIC CHOLECYSTECTOMY;  Surgeon: Jamesetta So, MD;  Location: [facility];  Service: General;  Laterality: N/A;  Attempted laparoscopic cholecystectomy  . CHOLECYSTECTOMY  06/02/2012   Procedure:  CHOLECYSTECTOMY;  Surgeon: Jamesetta So, MD;  Location: [facility] ORS;  Service: General;  Laterality: N/A;  converted to open at  0905  . ESOPHAGEAL DILATION  12/31/2018   Procedure: ESOPHAGEAL DILATION;  Surgeon: Rogene Houston, MD;  Location: [facility];  Service: Endoscopy;;  . ESOPHAGOGASTRODUODENOSCOPY (EGD) WITH PROPOFOL N/A 08/24/2015   Procedure: ESOPHAGOGASTRODUODENOSCOPY (EGD) WITH PROPOFOL;  Surgeon: Rogene Houston, MD;  Location: [facility];  Service: Endoscopy;  Laterality: N/A;  1:10 - Ann to notify pt to arrive at 11:30  . ESOPHAGOGASTRODUODENOSCOPY (EGD) WITH PROPOFOL N/A 12/31/2018   Procedure: ESOPHAGOGASTRODUODENOSCOPY (EGD) WITH PROPOFOL;  Surgeon: Rogene Houston, MD;  Location[facility];  Service: Endoscopy;  Laterality: N/A;  . tooth removal  2019   all teeth removed  . TUBAL LIGATION     X3    Family Psychiatric History: see below  Family History:  Family History  Problem Relation Age of Onset  . Hypertension Mother   . Alcohol abuse Mother   . Heart disease Mother   . Kidney disease Mother   . Hypertension Father   . Alcohol abuse Sister   . Stroke Other   . Diabetes Other   . Cancer Other   . Seizures Other   . Alcohol abuse Maternal Aunt   . Alcohol abuse Paternal Aunt   . Alcohol abuse Maternal Grandfather   . Alcohol abuse Maternal Grandmother   . Alcohol abuse Cousin   . Hearing loss Daughter     Social History:  Social History   Socioeconomic History  . Marital status: Married    Spouse name: Not on file  . Number of children: Not on file  . Years of education: Not on file  . Highest education level: Not on file  Occupational History  . Not on file  Tobacco Use  . Smoking status: Never Smoker  . Smokeless tobacco: Never Used  Vaping Use  . Vaping Use: Never used  Substance and Sexual Activity  . Alcohol use: No    Alcohol/week: 0.0 standard drinks  . Drug use: No  . Sexual activity: Not Currently    Birth  control/protection: Surgical  Other Topics Concern  . Not on file  Social History Narrative   Married for 22 years.On disability secondary to schizophrenia.Lives with husband.   Social Determinants of Health   Financial Resource Strain:   . Difficulty of Paying Living Expenses: Not on file  Food Insecurity:   . Worried About Charity fundraiser in the Last Year: Not on file  . Ran Out of Food in the Last Year: Not on file  Transportation Needs:   . Lack of Transportation (Medical): Not on file  . Lack of Transportation (Non-Medical): Not on file  Physical Activity:   . Days of Exercise per Week: Not on file  . Minutes of Exercise per Session: Not on file  Stress:   . Feeling of Stress : Not on file  Social Connections:   . Frequency of Communication  with Friends and Family: Not on file  . Frequency of Social Gatherings with Friends and Family: Not on file  . Attends Religious Services: Not on file  . Active Member of Clubs or Organizations: Not on file  . Attends Archivist Meetings: Not on file  . Marital Status: Not on file    Allergies: No Known Allergies  Metabolic Disorder Labs: Lab Results  Component Value Date   HGBA1C 10.9 (A) 10/25/2019   MPG  07/19/2019     Comment:     eAG cannot be calculated. Hemoglobin A1c result exceeds the linearity of the assay.    MPG 346 04/07/2019   No results found for: PROLACTIN Lab Results  Component Value Date   CHOL 166 04/07/2019   TRIG 362 (H) 04/07/2019   HDL 44 (L) 04/07/2019   CHOLHDL 3.8 04/07/2019   LDLCALC 78 04/07/2019   Lab Results  Component Value Date   TSH 1.26 02/07/2020   TSH 1.15 07/19/2019    Therapeutic Level Labs: Lab Results  Component Value Date   LITHIUM <0.06 (L) 07/03/2016   LITHIUM 1.17 09/09/2015   No results found for: VALPROATE No components found for:  CBMZ  Current Medications: Current Outpatient Medications  Medication Sig Dispense Refill  . acetaminophen-codeine  (TYLENOL #3) 300-30 MG tablet Take 1 tablet by mouth every 6 (six) hours as needed for moderate pain.     Marland Kitchen albuterol (VENTOLIN HFA) 108 (90 Base) MCG/ACT inhaler INHALE 2 PUFFS INTO THE LUNGS EVERY 6 HOURS AS NEEDED FOR WHEEZING OR SHORTNESS OF BREATH 6.7 g 1  . aspirin EC 81 MG tablet Take 81 mg by mouth daily.    Marland Kitchen atorvastatin (LIPITOR) 10 MG tablet Take 1 tablet (10 mg total) by mouth at bedtime. 90 tablet 1  . benztropine (COGENTIN) 1 MG tablet TAKE 1 TABLET(1 MG) BY MOUTH AT BEDTIME 30 tablet 2  . carvedilol (COREG) 12.5 MG tablet Take 1 tablet (12.5 mg total) by mouth 2 (two) times daily. 180 tablet 1  . Cholecalciferol (VITAMIN D-3) 125 MCG (5000 UT) TABS Take 2 tablets by mouth daily. 30 tablet 1  . clonazePAM (KLONOPIN) 0.5 MG tablet Take 1 tablet (0.5 mg total) by mouth 2 (two) times daily as needed for anxiety. 60 tablet 2  . dicyclomine (BENTYL) 10 MG capsule TAKE 1 CAPSULE(10 MG) BY MOUTH THREE TIMES DAILY BEFORE MEALS 90 capsule 2  . DULoxetine (CYMBALTA) 60 MG capsule Take 1 capsule (60 mg total) by mouth 2 (two) times daily. 60 capsule 2  . Exenatide ER (BYDUREON BCISE) 2 MG/0.85ML AUIJ Inject 2 mg into the skin every Monday. 4 pen 2  . famotidine (PEPCID) 20 MG tablet Take 1 tablet (20 mg total) by mouth 2 (two) times daily. 60 tablet 2  . FLOVENT HFA 44 MCG/ACT inhaler Inhale 2 puffs into the lungs in the morning and at bedtime. (Patient not taking: Reported on 02/08/2020) 1 each 3  . gabapentin (NEURONTIN) 300 MG capsule Take 1 capsule (300 mg total) by mouth 3 (three) times daily. 90 capsule 3  . glipiZIDE (GLUCOTROL XL) 5 MG 24 hr tablet Take 2 tablets (10 mg total) by mouth daily with breakfast. 180 tablet 1  . glucose blood (ACCU-CHEK GUIDE) test strip USE TO CHECK BLOOD SUGAR TWO TIMES DAILY 150 strip 0  . HUMALOG KWIKPEN 100 UNIT/ML KwikPen INJECT 10 TO 16 UNITS INTO THE SKIN THREE TIMES DAILY 15 mL 1  . insulin glargine, 2 Unit Dial, (TOUJEO  MAX SOLOSTAR) 300 UNIT/ML  Solostar Pen Inject 80 Units into the skin daily. 4 pen 2  . levothyroxine (SYNTHROID) 75 MCG tablet Take 1 tablet (75 mcg total) by mouth daily before breakfast. 90 tablet 1  . LINZESS 290 MCG CAPS capsule TAKE 1 CAPSULE(290 MCG) BY MOUTH DAILY BEFORE BREAKFAST 90 capsule 3  . losartan (COZAAR) 50 MG tablet Take 1 tablet (50 mg total) by mouth daily. (Patient not taking: Reported on 02/08/2020) 90 tablet 1  . ondansetron (ZOFRAN) 4 MG tablet Take 1 tablet (4 mg total) by mouth every 6 (six) hours. as needed for nausea or vomiting. (Patient not taking: Reported on 02/08/2020) 10 tablet 2  . pantoprazole (PROTONIX) 40 MG tablet Take 1 tablet (40 mg total) by mouth 2 (two) times daily before a meal. (Patient not taking: Reported on 02/08/2020) 60 tablet 3  . promethazine (PHENERGAN) 25 MG tablet Take 25 mg by mouth every 6 (six) hours as needed for nausea or vomiting.     . risperiDONE (RISPERDAL) 2 MG tablet Take one tablet qam three tablets at bedtime 120 tablet 2  . traZODone (DESYREL) 100 MG tablet TAKE 2 TABLETS(200 MG) BY MOUTH AT BEDTIME 60 tablet 2   No current facility-administered medications for this visit.     Musculoskeletal: Strength & Muscle Tone: within normal limits Gait & Station: normal Patient leans: N/A  Psychiatric Specialty Exam: Review of Systems  Psychiatric/Behavioral: The patient is nervous/anxious.   All other systems reviewed and are negative.   Last menstrual period 01/23/2013.There is no height or weight on file to calculate BMI.  General Appearance: NA  Eye Contact:  NA  Speech:  Clear and Coherent  Volume:  Normal  Mood:  Euthymic  Affect:  NA  Thought Process:  Goal Directed  Orientation:  Full (Time, Place, and Person)  Thought Content: Hallucinations: Auditory   Suicidal Thoughts:  No  Homicidal Thoughts:  No  Memory:  Immediate;   Good Recent;   Good Remote;   Good  Judgement:  Good  Insight:  Fair  Psychomotor Activity:  Normal   Concentration:  Concentration: Good and Attention Span: Good  Recall:  Good  Fund of Knowledge: Good  Language: Good  Akathisia:  No  Handed:  Right  AIMS (if indicated): not done  Assets:  Communication Skills Desire for Improvement Resilience Social Support Talents/Skills  ADL's:  Intact  Cognition: WNL  Sleep:  Good   Screenings: PHQ2-9     Office Visit from 10/12/2019 in [employer] from 08/15/2019 in [facility] Office Visit from 08/03/2017 in [place] [facility] Telephone from 03/03/2017 in [place] [facility] Telephone from 02/04/2017 in [place]  PHQ-2 Total Score 0 0 0 0 0       Assessment and Plan: This patient is a 49 year old female with a history of affective disorder and posttraumatic stress disorder.  She has had some episodes of anxiety so we will add clonazepam 0.5 mg up to twice daily as needed.  She will continue Risperdal 2 mg in the morning and 6 mg in the evening for schizophrenia, Cogentin 1 mg at bedtime to prevent side effects from Risperdal, Cymbalta 60 mg twice daily for depression and trazodone 200 mg at bedtime for sleep.  She will return to see me in 3 months   Levonne Spiller, MD 03/27/2020, 9:55 AM

## 2020-04-02 ENCOUNTER — Ambulatory Visit (INDEPENDENT_AMBULATORY_CARE_PROVIDER_SITE_OTHER): Payer: Medicare Other | Admitting: Gastroenterology

## 2020-04-30 ENCOUNTER — Other Ambulatory Visit: Payer: Self-pay | Admitting: "Endocrinology

## 2020-06-04 ENCOUNTER — Encounter (INDEPENDENT_AMBULATORY_CARE_PROVIDER_SITE_OTHER): Payer: Medicare Other | Admitting: Internal Medicine

## 2020-06-07 ENCOUNTER — Other Ambulatory Visit: Payer: Self-pay

## 2020-06-07 ENCOUNTER — Ambulatory Visit (INDEPENDENT_AMBULATORY_CARE_PROVIDER_SITE_OTHER): Payer: BC Managed Care – PPO | Admitting: Gastroenterology

## 2020-06-07 ENCOUNTER — Other Ambulatory Visit (INDEPENDENT_AMBULATORY_CARE_PROVIDER_SITE_OTHER): Payer: Self-pay

## 2020-06-07 ENCOUNTER — Telehealth (INDEPENDENT_AMBULATORY_CARE_PROVIDER_SITE_OTHER): Payer: Self-pay

## 2020-06-07 ENCOUNTER — Encounter (INDEPENDENT_AMBULATORY_CARE_PROVIDER_SITE_OTHER): Payer: Self-pay | Admitting: Gastroenterology

## 2020-06-07 ENCOUNTER — Encounter (INDEPENDENT_AMBULATORY_CARE_PROVIDER_SITE_OTHER): Payer: Self-pay

## 2020-06-07 VITALS — BP 135/91 | HR 106 | Temp 98.4°F | Ht 63.0 in | Wt 161.0 lb

## 2020-06-07 DIAGNOSIS — K581 Irritable bowel syndrome with constipation: Secondary | ICD-10-CM

## 2020-06-07 DIAGNOSIS — Z1211 Encounter for screening for malignant neoplasm of colon: Secondary | ICD-10-CM

## 2020-06-07 DIAGNOSIS — K30 Functional dyspepsia: Secondary | ICD-10-CM | POA: Insufficient documentation

## 2020-06-07 MED ORDER — PANTOPRAZOLE SODIUM 40 MG PO TBEC: 40.0000 mg | DELAYED_RELEASE_TABLET | Freq: Every day | ORAL | 1 refills | Status: DC

## 2020-06-07 MED ORDER — NA SULFATE-K SULFATE-MG SULF 17.5-3.13-1.6 GM/177ML PO SOLN: 354.0000 mL | Freq: Once | ORAL | 0 refills | Status: AC

## 2020-06-07 NOTE — Telephone Encounter (Signed)
Kathryn [name], CMA  

## 2020-06-07 NOTE — Progress Notes (Signed)
Kathryn [name], M.D. Gastroenterology & Hepatology [facility] [address] [place], [place] [address]  Primary Care Physician: Doree Albee, MD [place] [address]  I will communicate my assessment and recommendations to the referring MD via EMR.  Problems: 1. Chronic upper abdominal pain, possible functional dyspepsia 2. IBS-C  History of Present Illness: Kathryn [name] is a 50 y.o. female with past medical history of anxiety, depression, asthma, GERD, hyperlipidemia, hypertension, hypothyroidism, schizoaffective disorder, IBS-C and diabetes, who presents for follow up of chronic upper abdominal pain.  The patient was last seen on 01/19/2020. At that time, the patient was switched to Protonix 40 mg twice a day for management of persistent epigastric pain.  Was also prescribed Linzess to 90 mcg every day for constipation.  The patient fell asleep multiple times during the visit encounter.  She reported that "she was very tired as she has to work from 7 PM to 7 AM".  States that her daughter will pick her up from the office today.  Patient states that she is feeling better after taking compliantly the pantoprazole 40 mg every day.  States her abdominal pain has much more improved and is not feeling nauseated or with vomiting.  Has been eating as usual and has not lost any weight.  Very occasionally she takes Zofran as needed.  Also reports that Linzess has been useful to have bowel movements.  She has a bowel movement every other day while taking Linzess 290 mcg every day.  Denies any bloating or tenesmus. The patient denies having any  fever, chills, hematochezia, melena, hematemesis, diarrhea, jaundice, pruritus or weight loss.  Last EGD: 12/31/2018 Normal hypopharynx and laryngeal inlet. - Normal esophagus. - Z-line regular, 38 cm from the incisors. - No endoscopic esophageal abnormality to explain patient's  dysphagia. Esophagus dilated. - Normal stomach. - Normal duodenal bulb and second portion of the duodenum.  Last Colonoscopy: Never  Past Medical History: Past Medical History:  Diagnosis Date  . Anemia   . Anxiety   . Asthma   . Depression   . GERD (gastroesophageal reflux disease)   . HLD (hyperlipidemia) 04/07/2019  . Hypertension   . Hypothyroidism, adult 04/07/2019  . Neuropathy   . Obesity (BMI 30.0-34.9) 04/07/2019  . Schizoaffective disorder ([place])   . Type II diabetes mellitus, uncontrolled ([place]) 04/27/2019    Past Surgical History: Past Surgical History:  Procedure Laterality Date  . BREAST SURGERY Right    biopsy  . CESAREAN SECTION    . CHOLECYSTECTOMY  06/02/2012   Procedure: LAPAROSCOPIC CHOLECYSTECTOMY;  Surgeon: Jamesetta So, MD;  Location: [facility];  Service: General;  Laterality: N/A;  Attempted laparoscopic cholecystectomy  . CHOLECYSTECTOMY  06/02/2012   Procedure: CHOLECYSTECTOMY;  Surgeon: Jamesetta So, MD;  Location: [facility] OR[facility];  Service: General;  Laterality: N/A;  converted to open at  0905  . ESOPHAGEAL DILATION  12/31/2018   Procedure: ESOPHAGEAL DILATION;  Surgeon: Rogene Houston, MD;  [facility];  Service: Endoscopy;;  . ESOPHAGOGASTRODUODENOSCOPY (EGD) WITH PROPOFOL N/A 08/24/2015   Procedure: ESOPHAGOGASTRODUODENOSCOPY (EGD) WITH PROPOFOL;  Surgeon: Rogene Houston, MD;  [facility];  Service: Endoscopy;  Laterality: N/A;  1:10 - Ann to notify pt to arrive at 11:30  . ESOPHAGOGASTRODUODENOSCOPY (EGD) WITH PROPOFOL N/A 12/31/2018   Procedure: ESOPHAGOGASTRODUODENOSCOPY (EGD) WITH PROPOFOL;  Surgeon: Rogene Houston, MD;  [facility];  Service: Endoscopy;  Laterality: N/A;  .  tooth removal  2019   all teeth removed  . TUBAL LIGATION     X3    Family History: Family History  Problem Relation Age of Onset  . Hypertension Mother   . Alcohol abuse Mother   . Heart disease Mother   . Kidney disease Mother   .  Hypertension Father   . Alcohol abuse Sister   . Stroke Other   . Diabetes Other   . Cancer Other   . Seizures Other   . Alcohol abuse Maternal Aunt   . Alcohol abuse Paternal Aunt   . Alcohol abuse Maternal Grandfather   . Alcohol abuse Maternal Grandmother   . Alcohol abuse Cousin   . Hearing loss Daughter     Social History: Social History   Tobacco Use  Smoking Status Never Smoker  Smokeless Tobacco Never Used   Social History   Substance and Sexual Activity  Alcohol Use No  . Alcohol/week: 0.0 standard drinks   Social History   Substance and Sexual Activity  Drug Use No    Allergies: No Known Allergies  Medications: Current Outpatient Medications  Medication Sig Dispense Refill  . acetaminophen-codeine (TYLENOL #3) 300-30 MG tablet Take 1 tablet by mouth every 6 (six) hours as needed for moderate pain.     Marland Kitchen albuterol (VENTOLIN HFA) 108 (90 Base) MCG/ACT inhaler INHALE 2 PUFFS INTO THE LUNGS EVERY 6 HOURS AS NEEDED FOR WHEEZING OR SHORTNESS OF BREATH 6.7 g 1  . aspirin EC 81 MG tablet Take 81 mg by mouth daily.    Marland Kitchen atorvastatin (LIPITOR) 10 MG tablet Take 1 tablet (10 mg total) by mouth at bedtime. 90 tablet 1  . benztropine (COGENTIN) 1 MG tablet TAKE 1 TABLET(1 MG) BY MOUTH AT BEDTIME 30 tablet 2  . carvedilol (COREG) 12.5 MG tablet Take 1 tablet (12.5 mg total) by mouth 2 (two) times daily. 180 tablet 1  . Cholecalciferol (VITAMIN D-3) 125 MCG (5000 UT) TABS Take 2 tablets by mouth daily. 30 tablet 1  . clonazePAM (KLONOPIN) 0.5 MG tablet Take 1 tablet (0.5 mg total) by mouth 2 (two) times daily as needed for anxiety. 60 tablet 2  . dicyclomine (BENTYL) 10 MG capsule TAKE 1 CAPSULE(10 MG) BY MOUTH THREE TIMES DAILY BEFORE MEALS 90 capsule 2  . DULoxetine (CYMBALTA) 60 MG capsule Take 1 capsule (60 mg total) by mouth 2 (two) times daily. 60 capsule 2  . famotidine (PEPCID) 20 MG tablet Take 1 tablet (20 mg total) by mouth 2 (two) times daily. 60 tablet 2  .  FLOVENT HFA 44 MCG/ACT inhaler Inhale 2 puffs into the lungs in the morning and at bedtime. 1 each 3  . glipiZIDE (GLUCOTROL XL) 5 MG 24 hr tablet Take 2 tablets (10 mg total) by mouth daily with breakfast. 180 tablet 1  . glucose blood (ACCU-CHEK GUIDE) test strip USE TO CHECK BLOOD SUGAR TWICE DAILY 150 strip 0  . HUMALOG KWIKPEN 100 UNIT/ML KwikPen INJECT 10 TO 16 UNITS INTO THE SKIN THREE TIMES DAILY 15 mL 1  . insulin glargine, 2 Unit Dial, (TOUJEO MAX SOLOSTAR) 300 UNIT/ML Solostar Pen Inject 80 Units into the skin daily. 4 pen 2  . levothyroxine (SYNTHROID) 75 MCG tablet Take 1 tablet (75 mcg total) by mouth daily before breakfast. 90 tablet 1  . LINZESS 290 MCG CAPS capsule TAKE 1 CAPSULE(290 MCG) BY MOUTH DAILY BEFORE BREAKFAST 90 capsule 3  . losartan (COZAAR) 50 MG tablet Take 1 tablet (50 mg  total) by mouth daily. 90 tablet 1  . ondansetron (ZOFRAN) 4 MG tablet Take 1 tablet (4 mg total) by mouth every 6 (six) hours. as needed for nausea or vomiting. 10 tablet 2  . pantoprazole (PROTONIX) 40 MG tablet Take 1 tablet (40 mg total) by mouth 2 (two) times daily before a meal. 60 tablet 3  . promethazine (PHENERGAN) 25 MG tablet Take 25 mg by mouth every 6 (six) hours as needed for nausea or vomiting.     . risperiDONE (RISPERDAL) 2 MG tablet Take one tablet qam three tablets at bedtime 120 tablet 2  . traZODone (DESYREL) 100 MG tablet TAKE 2 TABLETS(200 MG) BY MOUTH AT BEDTIME 60 tablet 2  . Exenatide ER (BYDUREON BCISE) 2 MG/0.85ML AUIJ Inject 2 mg into the skin every Monday. 4 pen 2  . gabapentin (NEURONTIN) 300 MG capsule Take 1 capsule (300 mg total) by mouth 3 (three) times daily. 90 capsule 3   No current facility-administered medications for this visit.    Review of Systems: GENERAL: negative for malaise, night sweats HEENT: No changes in hearing or vision, no nose bleeds or other nasal problems. NECK: Negative for lumps, goiter, pain and significant neck swelling RESPIRATORY:  Negative for cough, wheezing CARDIOVASCULAR: Negative for chest pain, leg swelling, palpitations, orthopnea GI: SEE HPI MUSCULOSKELETAL: Negative for joint pain or swelling, back pain, and muscle pain. SKIN: Negative for lesions, rash PSYCH: Negative for sleep disturbance, mood disorder and recent psychosocial stressors. HEMATOLOGY Negative for prolonged bleeding, bruising easily, and swollen nodes. ENDOCRINE: Negative for cold or heat intolerance, polyuria, polydipsia and goiter. NEURO: negative for tremor, gait imbalance, syncope and seizures. The remainder of the review of systems is noncontributory.   Physical Exam: BP (!) 135/91 (BP Location: Left Arm, Patient Position: Sitting, Cuff Size: Large)   Pulse (!) 106   Temp 98.4 F (36.9 C) (Oral)   Ht 5\' 3"  (1.6 m)   Wt 161 lb (73 kg)   LMP 01/23/2013   BMI 28.52 kg/m  GENERAL: The patient is AO x3 but is extremely somnolent and falls asleep multiple times during the visit encounter, in no acute distress.  HEENT: Head is normocephalic and atraumatic. EOMI are intact. Mouth is well hydrated and without lesions. NECK: Supple. No masses LUNGS: Clear to auscultation. No presence of rhonchi/wheezing/rales. Adequate chest expansion HEART: RRR, normal s1 and s2. ABDOMEN: Mildly tender upon palpation of the right upper quadrant, no guarding, no peritoneal signs, and nondistended. BS +. No masses. EXTREMITIES: Without any cyanosis, clubbing, rash, lesions or edema. NEUROLOGIC: AOx3, no focal motor deficit. SKIN: no jaundice, no rashes  Imaging/Labs: as above  I personally reviewed and interpreted the available labs, imaging and endoscopic files.  Impression and Plan: Kathryn [name] is a 50 y.o. female with past medical history of anxiety, depression, asthma, GERD, hyperlipidemia, hypertension, hypothyroidism, schizoaffective disorder, IBS-C and diabetes, who presents for follow up of chronic upper abdominal pain.  The patient has had  major improvement in her symptoms while taking pantoprazole once a day.  Given the relatively recent EGD performed that did not show any alterations explain her pain, it is very likely her symptoms are related to functional dyspepsia as she has not presented any red flag signs.  We will continue with pantoprazole at current dose.  Also, her IBS-C has been well controlled with Linzess 290 mcg every day.  If she were to develop any worsening symptoms, may consider performing a CT of the abdomen versus gastric  emptying study given her longstanding history of diabetes.  Finally, the patient is due for colorectal cancer screening, for which I will order a colonoscopy.  - Schedule colonoscopy - Continue pantoprazole 40 mg qday - Continue Linzess 290 mcg qday - RTC 6 months  All questions were answered.      [name], MD Gastroenterology and Hepatology [facility]

## 2020-06-07 NOTE — Patient Instructions (Addendum)
Schedule colonoscopy Continue pantoprazole 40 mg qday Continue Linzess 290 mcg qday

## 2020-06-19 ENCOUNTER — Other Ambulatory Visit (INDEPENDENT_AMBULATORY_CARE_PROVIDER_SITE_OTHER): Payer: Self-pay | Admitting: Gastroenterology

## 2020-06-19 DIAGNOSIS — K30 Functional dyspepsia: Secondary | ICD-10-CM

## 2020-06-27 ENCOUNTER — Other Ambulatory Visit: Payer: Self-pay

## 2020-06-27 ENCOUNTER — Telehealth (HOSPITAL_COMMUNITY): Payer: BC Managed Care – PPO | Admitting: Psychiatry

## 2020-06-28 ENCOUNTER — Encounter (INDEPENDENT_AMBULATORY_CARE_PROVIDER_SITE_OTHER): Payer: Self-pay | Admitting: Nurse Practitioner

## 2020-06-28 ENCOUNTER — Ambulatory Visit (INDEPENDENT_AMBULATORY_CARE_PROVIDER_SITE_OTHER): Payer: BC Managed Care – PPO | Admitting: Nurse Practitioner

## 2020-06-28 ENCOUNTER — Telehealth (INDEPENDENT_AMBULATORY_CARE_PROVIDER_SITE_OTHER): Payer: Self-pay | Admitting: Nurse Practitioner

## 2020-06-28 ENCOUNTER — Other Ambulatory Visit: Payer: Self-pay

## 2020-06-28 VITALS — BP 142/90 | HR 95 | Temp 97.1°F | Ht 61.0 in | Wt 158.8 lb

## 2020-06-28 DIAGNOSIS — E039 Hypothyroidism, unspecified: Secondary | ICD-10-CM | POA: Diagnosis not present

## 2020-06-28 DIAGNOSIS — I10 Essential (primary) hypertension: Secondary | ICD-10-CM

## 2020-06-28 DIAGNOSIS — E782 Mixed hyperlipidemia: Secondary | ICD-10-CM | POA: Diagnosis not present

## 2020-06-28 DIAGNOSIS — E559 Vitamin D deficiency, unspecified: Secondary | ICD-10-CM

## 2020-06-28 DIAGNOSIS — R292 Abnormal reflex: Secondary | ICD-10-CM

## 2020-06-28 DIAGNOSIS — E1165 Type 2 diabetes mellitus with hyperglycemia: Secondary | ICD-10-CM

## 2020-06-28 DIAGNOSIS — R152 Fecal urgency: Secondary | ICD-10-CM

## 2020-06-28 DIAGNOSIS — R159 Full incontinence of feces: Secondary | ICD-10-CM

## 2020-06-28 DIAGNOSIS — M5441 Lumbago with sciatica, right side: Secondary | ICD-10-CM

## 2020-06-28 DIAGNOSIS — M5442 Lumbago with sciatica, left side: Secondary | ICD-10-CM

## 2020-06-28 MED ORDER — GABAPENTIN 300 MG PO CAPS: 300.0000 mg | ORAL_CAPSULE | Freq: Three times a day (TID) | ORAL | 3 refills | Status: DC

## 2020-06-28 MED ORDER — CARVEDILOL 12.5 MG PO TABS: 12.5000 mg | ORAL_TABLET | Freq: Two times a day (BID) | ORAL | 1 refills | Status: DC

## 2020-06-28 NOTE — Progress Notes (Signed)
Subjective:  Patient ID: Kathryn [name], female    DOB: 1970/06/15  Age: 50 y.o. MRN: [id number]  CC:  Chief Complaint  Patient presents with  . Hip Pain      HPI  This patient arrives today for the above.  She reports having bilateral hip pain that started approximately 3 weeks ago.  She denies history of traumatic event or known trigger for initiating the pain.  Tells me that her hips will be stiff especially at night and upon her starting to walk but will seem to loosen up as the day goes on.  She reports the pain as 10/10 and will describe it as stiffness, sharp, aching, and dull.  She will take ibuprofen which seems to relieve the pain for approximately 4 hours and she is also using heating pad as needed.  She does report reduced sensation in her lower extremities as well as fecal urgency which resulted in fecal incontinence.  She tells me the fecal incontinence been going on for approximately 3 months and she also says that she does have some saddle paresthesias that have been going on for about 3 months.  She reports intermittent urinary incontinence as well.  Of note, to [place] supposed to be patient's annual physical exam however she requested to have this scheduled later in the year due to her job requirements.  She is requesting refills on carvedilol and gabapentin.  She is being monitored by endocrinology for treatment of her type 2 diabetes, she tells me she plans on calling her endocrinologist to set up an appointment shortly.  She had to cancel her previous appointment because of insurance issues but these have now been resolved.  She is due for some blood work today.  Past Medical History:  Diagnosis Date  . Anemia   . Anxiety   . Asthma   . Depression   . GERD (gastroesophageal reflux disease)   . HLD (hyperlipidemia) 04/07/2019  . Hypertension   . Hypothyroidism, adult 04/07/2019  . Neuropathy   . Obesity (BMI 30.0-34.9) 04/07/2019  . Schizoaffective disorder ([place])    . Type II diabetes mellitus, uncontrolled ([place]) 04/27/2019      Family History  Problem Relation Age of Onset  . Hypertension Mother   . Alcohol abuse Mother   . Heart disease Mother   . Kidney disease Mother   . Hypertension Father   . Alcohol abuse Sister   . Stroke Other   . Diabetes Other   . Cancer Other   . Seizures Other   . Alcohol abuse Maternal Aunt   . Alcohol abuse Paternal Aunt   . Alcohol abuse Maternal Grandfather   . Alcohol abuse Maternal Grandmother   . Alcohol abuse Cousin   . Hearing loss Daughter     Social History   Social History Narrative   Married for 22 years.On disability secondary to schizophrenia.Lives with husband.   Social History   Tobacco Use  . Smoking status: Never Smoker  . Smokeless tobacco: Never Used  Substance Use Topics  . Alcohol use: No    Alcohol/week: 0.0 standard drinks     Current Meds  Medication Sig  . albuterol (VENTOLIN HFA) 108 (90 Base) MCG/ACT inhaler INHALE 2 PUFFS INTO THE LUNGS EVERY 6 HOURS AS NEEDED FOR WHEEZING OR SHORTNESS OF BREATH  . aspirin EC 81 MG tablet Take 81 mg by mouth daily.  Marland Kitchen atorvastatin (LIPITOR) 10 MG tablet Take 1 tablet (10 mg total) by mouth  at bedtime.  . benztropine (COGENTIN) 1 MG tablet TAKE 1 TABLET(1 MG) BY MOUTH AT BEDTIME  . Cholecalciferol (VITAMIN D-3) 125 MCG (5000 UT) TABS Take 2 tablets by mouth daily.  . clonazePAM (KLONOPIN) 0.5 MG tablet Take 1 tablet (0.5 mg total) by mouth 2 (two) times daily as needed for anxiety.  . DULoxetine (CYMBALTA) 60 MG capsule Take 1 capsule (60 mg total) by mouth 2 (two) times daily.  . Exenatide ER (BYDUREON BCISE) 2 MG/0.85ML AUIJ Inject 2 mg into the skin every Monday.  Marland Kitchen FLOVENT HFA 44 MCG/ACT inhaler Inhale 2 puffs into the lungs in the morning and at bedtime.  Marland Kitchen glipiZIDE (GLUCOTROL XL) 5 MG 24 hr tablet Take 2 tablets (10 mg total) by mouth daily with breakfast.  . glucose blood (ACCU-CHEK GUIDE) test strip USE TO CHECK BLOOD SUGAR  TWICE DAILY  . insulin glargine, 2 Unit Dial, (TOUJEO MAX SOLOSTAR) 300 UNIT/ML Solostar Pen Inject 80 Units into the skin daily.  Marland Kitchen LINZESS 290 MCG CAPS capsule TAKE 1 CAPSULE(290 MCG) BY MOUTH DAILY BEFORE BREAKFAST  . losartan (COZAAR) 50 MG tablet Take 1 tablet (50 mg total) by mouth daily.  . ondansetron (ZOFRAN) 4 MG tablet Take 1 tablet (4 mg total) by mouth every 6 (six) hours. as needed for nausea or vomiting.  . pantoprazole (PROTONIX) 40 MG tablet Take 1 tablet (40 mg total) by mouth daily.  . promethazine (PHENERGAN) 25 MG tablet Take 25 mg by mouth every 6 (six) hours as needed for nausea or vomiting.   . risperiDONE (RISPERDAL) 2 MG tablet Take one tablet qam three tablets at bedtime  . traZODone (DESYREL) 100 MG tablet TAKE 2 TABLETS(200 MG) BY MOUTH AT BEDTIME  . [DISCONTINUED] carvedilol (COREG) 12.5 MG tablet Take 1 tablet (12.5 mg total) by mouth 2 (two) times daily.  . [DISCONTINUED] gabapentin (NEURONTIN) 300 MG capsule Take 1 capsule (300 mg total) by mouth 3 (three) times daily.    ROS:  Review of Systems  Constitutional: Negative for diaphoresis and fever.  Gastrointestinal:       (+) fecal incontinence  Genitourinary:       (+) urge incontinence  Musculoskeletal: Positive for joint pain. Negative for falls.  Neurological: Positive for sensory change and weakness.     Objective:   Today's Vitals: BP (!) 142/90   Pulse 95   Temp (!) 97.1 F (36.2 C) (Temporal)   Ht '5\' 1"'  (1.549 m)   Wt 158 lb 12.8 oz (72 kg)   LMP 01/23/2013   SpO2 99%   BMI 30.00 kg/m  Vitals with BMI 06/28/2020 06/07/2020 02/08/2020  Height '5\' 1"'  '5\' 3"'  -  Weight 158 lbs 13 oz 161 lbs -  BMI 85.63 14.97 -  Systolic 026 378 588  Diastolic 90 91 86  Pulse 95 106 98  Some encounter information is confidential and restricted. Go to Review Flowsheets activity to see all data.     Physical Exam Vitals reviewed.  Constitutional:      General: She is not in acute distress.    Appearance:  Normal appearance.  HENT:     Head: Normocephalic and atraumatic.  Neck:     Vascular: No carotid bruit.  Cardiovascular:     Rate and Rhythm: Normal rate and regular rhythm.     Pulses: Normal pulses.     Heart sounds: Normal heart sounds.  Pulmonary:     Effort: Pulmonary effort is normal.     Breath sounds: Normal  breath sounds.  Musculoskeletal:     Cervical back: Normal.     Thoracic back: Normal.     Lumbar back: Tenderness present. Positive right straight leg raise test and positive left straight leg raise test.  Skin:    General: Skin is warm and dry.  Neurological:     General: No focal deficit present.     Mental Status: She is alert and oriented to person, place, and time.     Sensory: Sensory deficit (reduced sensation to right lower extremity) present.     Motor: Motor function is intact.     Gait: Gait is intact.     Deep Tendon Reflexes:     Reflex Scores:      Brachioradialis reflexes are 2+ on the right side and 2+ on the left side.      Patellar reflexes are 0 on the right side and 2+ on the left side. Psychiatric:        Mood and Affect: Mood normal.        Behavior: Behavior normal.        Judgment: Judgment normal.          Assessment and Plan   1. Vitamin D deficiency   2. Mixed hyperlipidemia   3. Essential hypertension, benign   4. Hypothyroidism, unspecified type   5. Uncontrolled type 2 diabetes mellitus with hyperglycemia ([place])   6. Acute bilateral low back pain with bilateral sciatica   7. Incontinence of feces with fecal urgency   8. Reflex abnormality      Plan: 1.  Should continue on her vitamin D3 supplement we will check serum level today. 2.  We will check a lipid panel for further evaluation today. 3.  Blood pressure a bit elevated today but patient is in pain, will hold off on making changes to blood pressure medications we will monitor this closely. 4.  We will check TSH today. 5.  We will refill gabapentin patient will  follow up with endocrinology. 6.-8.  I am concerned that her hip pain may actually be radiculopathy from her back.  In addition she has lost her right patellar reflex, has a sensory abnormalities in the right leg, and reports fecal urgency and incontinence as well as saddle paresthesias.  Her fecal incontinence and saddle paresthesias have been ongoing for approximately 3 months so while I do not think she needs stat MRI of the lumbar spine I do think getting imaging of the lumbar spine is warranted.  I have discussed this with Dr. Anastasio Champion and he is agreeable to the above plan, I will order MRI without contrast of lumbar spine for further evaluation.  In the meantime patient was encouraged to take Tylenol 500 mg by mouth every 4-6 hours while awake as needed, and ibuprofen as needed in between Tylenol doses for pain.  She was told if her fecal incontinence, urinary incontinence, sensory abnormalities in legs, or if she experiences weakness in her legs acutely she needs to call [phone].  She tells me she understands.  Tests ordered Orders Placed This Encounter  Procedures  . MR Lumbar Spine Wo Contrast  . TSH  . Vitamin D, 25-hydroxy  . CMP with eGFR([facility])  . Lipid Panel      Meds ordered this encounter  Medications  . gabapentin (NEURONTIN) 300 MG capsule    Sig: Take 1 capsule (300 mg total) by mouth 3 (three) times daily.    Dispense:  90 capsule    Refill:  3    Order Specific Question:   Supervising Provider    Answer:   Hurshel Party C [[id number]]  . carvedilol (COREG) 12.5 MG tablet    Sig: Take 1 tablet (12.5 mg total) by mouth 2 (two) times daily.    Dispense:  180 tablet    Refill:  1    Order Specific Question:   Supervising Provider    Answer:   Doree Albee [[id number]]    Patient to follow-up in 1 month or sooner as needed.  Ailene Ards, NP

## 2020-06-28 NOTE — Telephone Encounter (Signed)
Getting Pa submitted; Then will schedule.

## 2020-06-28 NOTE — Telephone Encounter (Signed)
I have ordered MRI without contrast for this patient at her office visit today of the lumbar spine.  While the order is NOT STAT, I would like this worked on urgently if possible.  Please make sure this is run through insurance and then subsequently scheduled.  Thank you.

## 2020-06-29 ENCOUNTER — Other Ambulatory Visit (HOSPITAL_COMMUNITY)
Admission: RE | Admit: 2020-06-29 | Discharge: 2020-06-29 | Disposition: A | Discharge status: Home / Self Care | Payer: BC Managed Care – PPO | Source: Ambulatory Visit | Attending: Gastroenterology | Admitting: Gastroenterology

## 2020-06-29 LAB — COMPLETE METABOLIC PANEL WITHOUT GFR
AG Ratio: 1.4 (calc) (ref 1.0–2.5)
ALT: 9 U/L (ref 6–29)
AST: 8 U/L — ABNORMAL LOW (ref 10–35)
Albumin: 4.2 g/dL (ref 3.6–5.1)
Alkaline phosphatase (APISO): 71 U/L (ref 31–125)
BUN: 25 mg/dL (ref 7–25)
CO2: 26 mmol/L (ref 20–32)
Calcium: 9.5 mg/dL (ref 8.6–10.2)
Chloride: 103 mmol/L (ref 98–110)
Creat: 1.09 mg/dL (ref 0.50–1.10)
GFR, Est African American: 69 mL/min/1.73m2
GFR, Est Non African American: 60 mL/min/1.73m2
Globulin: 2.9 g/dL (ref 1.9–3.7)
Glucose, Bld: 310 mg/dL — ABNORMAL HIGH (ref 65–99)
Potassium: 3.8 mmol/L (ref 3.5–5.3)
Sodium: 139 mmol/L (ref 135–146)
Total Bilirubin: 0.7 mg/dL (ref 0.2–1.2)
Total Protein: 7.1 g/dL (ref 6.1–8.1)

## 2020-06-29 LAB — LIPID PANEL
Cholesterol: 112 mg/dL
HDL: 51 mg/dL
LDL Cholesterol (Calc): 42 mg/dL
Non-HDL Cholesterol (Calc): 61 mg/dL
Total CHOL/HDL Ratio: 2.2 (calc)
Triglycerides: 108 mg/dL

## 2020-06-29 LAB — VITAMIN D 25 HYDROXY (VIT D DEFICIENCY, FRACTURES): Vit D, 25-Hydroxy: 33 ng/mL (ref 30–100)

## 2020-06-29 LAB — TSH: TSH: 1.18 m[IU]/L

## 2020-06-30 ENCOUNTER — Other Ambulatory Visit (INDEPENDENT_AMBULATORY_CARE_PROVIDER_SITE_OTHER): Payer: Self-pay | Admitting: Nurse Practitioner

## 2020-06-30 DIAGNOSIS — J4540 Moderate persistent asthma, uncomplicated: Secondary | ICD-10-CM

## 2020-06-30 DIAGNOSIS — I10 Essential (primary) hypertension: Secondary | ICD-10-CM

## 2020-06-30 DIAGNOSIS — K219 Gastro-esophageal reflux disease without esophagitis: Secondary | ICD-10-CM

## 2020-06-30 DIAGNOSIS — E039 Hypothyroidism, unspecified: Secondary | ICD-10-CM

## 2020-06-30 DIAGNOSIS — E782 Mixed hyperlipidemia: Secondary | ICD-10-CM

## 2020-06-30 DIAGNOSIS — E1165 Type 2 diabetes mellitus with hyperglycemia: Secondary | ICD-10-CM

## 2020-07-03 ENCOUNTER — Encounter (HOSPITAL_COMMUNITY): Admission: RE | Payer: Self-pay | Source: Home / Self Care

## 2020-07-03 ENCOUNTER — Ambulatory Visit (HOSPITAL_COMMUNITY)
Admission: RE | Admit: 2020-07-03 | Payer: BC Managed Care – PPO | Source: Home / Self Care | Admitting: Gastroenterology

## 2020-07-03 SURGERY — COLONOSCOPY WITH PROPOFOL
Anesthesia: Monitor Anesthesia Care

## 2020-07-04 NOTE — Telephone Encounter (Signed)
Will you see if this has been started? It was initiated last week, but I am not sure if it has been completed or scheduled yet.

## 2020-07-04 NOTE — Telephone Encounter (Signed)
She is scheduled for 07/16/20 4:30 at [facility]. I called her twice but had to leave voicemail with appt info and to call back to confirm she was aware.

## 2020-07-06 ENCOUNTER — Telehealth (INDEPENDENT_AMBULATORY_CARE_PROVIDER_SITE_OTHER): Payer: BC Managed Care – PPO | Admitting: Psychiatry

## 2020-07-06 ENCOUNTER — Encounter (HOSPITAL_COMMUNITY): Payer: Self-pay | Admitting: Psychiatry

## 2020-07-06 ENCOUNTER — Other Ambulatory Visit: Payer: Self-pay

## 2020-07-06 DIAGNOSIS — E782 Mixed hyperlipidemia: Secondary | ICD-10-CM

## 2020-07-06 DIAGNOSIS — J4540 Moderate persistent asthma, uncomplicated: Secondary | ICD-10-CM | POA: Diagnosis not present

## 2020-07-06 DIAGNOSIS — F251 Schizoaffective disorder, depressive type: Secondary | ICD-10-CM

## 2020-07-06 DIAGNOSIS — F4310 Post-traumatic stress disorder, unspecified: Secondary | ICD-10-CM

## 2020-07-06 MED ORDER — DULOXETINE HCL 60 MG PO CPEP: 60.0000 mg | ORAL_CAPSULE | Freq: Two times a day (BID) | ORAL | 2 refills | Status: DC

## 2020-07-06 MED ORDER — TRAZODONE HCL 100 MG PO TABS
ORAL_TABLET | ORAL | 2 refills | Status: DC
Start: 2020-07-06 — End: 2020-07-17

## 2020-07-06 MED ORDER — RISPERIDONE 2 MG PO TABS
ORAL_TABLET | ORAL | 2 refills | Status: DC
Start: 2020-07-06 — End: 2020-07-17

## 2020-07-06 MED ORDER — CLONAZEPAM 0.5 MG PO TABS: 0.5000 mg | ORAL_TABLET | Freq: Two times a day (BID) | ORAL | 2 refills | Status: DC | PRN

## 2020-07-06 MED ORDER — BENZTROPINE MESYLATE 1 MG PO TABS
ORAL_TABLET | ORAL | 2 refills | Status: DC
Start: 2020-07-06 — End: 2020-07-17

## 2020-07-06 NOTE — Progress Notes (Signed)
Virtual Visit via Telephone Note  I connected with Kathryn [name] on 07/06/20 at  9:40 AM EST by telephone and verified that I am speaking with the correct person using two identifiers.  Location: Patient: home Provider: home   I discussed the limitations, risks, security and privacy concerns of performing an evaluation and management service by telephone and the availability of in person appointments. I also discussed with the patient that there may be a patient responsible charge related to this service. The patient expressed understanding and agreed to proceed.     I discussed the assessment and treatment plan with the patient. The patient was provided an opportunity to ask questions and all were answered. The patient agreed with the plan and demonstrated an understanding of the instructions.   The patient was advised to call back or seek an in-person evaluation if the symptoms worsen or if the condition fails to improve as anticipated.  I provided 15 minutes of non-face-to-face time during this encounter.   Levonne Spiller, MD  [facility] MD/PA/NP OP Progress Note  07/06/2020 10:04 AM Kathryn [name]  MRN:  [id number]  Chief Complaint:  Chief Complaint    Depression; Anxiety; Schizophrenia; Follow-up     HPI: This patient is a 50 year old separated black female who lives with her daughter in [place]. 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  The patient returns for follow-up after 3 months.  She is continuing to work part-time and she enjoys it.  She has made a lot of friends at the plant.  She states that her mood is good and she is much less anxious since we added clonazepam.  She denies any recent auditory hallucinations.  She is sleeping well at night.  She denies any paranoia.  She denies thoughts of suicide or self-harm Visit Diagnosis:    ICD-10-CM   1. Schizoaffective disorder, depressive type ([place])  F25.1   2. Moderate  persistent asthma, unspecified whether complicated  W09.81 DULoxetine (CYMBALTA) 60 MG capsule  3. PTSD (post-traumatic stress disorder)  F43.10     Past Psychiatric History: 2 previous psychiatric hospitalizations for schizoaffective disorder  Past Medical History:  Past Medical History:  Diagnosis Date  . Anemia   . Anxiety   . Asthma   . Depression   . GERD (gastroesophageal reflux disease)   . HLD (hyperlipidemia) 04/07/2019  . Hypertension   . Hypothyroidism, adult 04/07/2019  . Neuropathy   . Obesity (BMI 30.0-34.9) 04/07/2019  . Schizoaffective disorder ([place])   . Type II diabetes mellitus, uncontrolled ([place]) 04/27/2019    Past Surgical History:  Procedure Laterality Date  . BREAST SURGERY Right    biopsy  . CESAREAN SECTION    . CHOLECYSTECTOMY  06/02/2012   Procedure: LAPAROSCOPIC CHOLECYSTECTOMY;  Surgeon: Jamesetta So, MD;  Location: [facility];  Service: General;  Laterality: N/A;  Attempted laparoscopic cholecystectomy  . CHOLECYSTECTOMY  06/02/2012   Procedure: CHOLECYSTECTOMY;  Surgeon: Jamesetta So, MD;  Location: [facility];  Service: General;  Laterality: N/A;  converted to open at  0905  . ESOPHAGEAL DILATION  12/31/2018   Procedure: ESOPHAGEAL DILATION;  Surgeon: Rogene Houston, MD;  Location: [facility];  Service: Endoscopy;;  . ESOPHAGOGASTRODUODENOSCOPY (EGD) WITH PROPOFOL N/A 08/24/2015   Procedure: ESOPHAGOGASTRODUODENOSCOPY (EGD) WITH PROPOFOL;  Surgeon: Rogene Houston, MD;  Location: [facility];  Service: Endoscopy;  Laterality: N/A;  1:10 - Ann to notify pt to arrive  at 11:30  . ESOPHAGOGASTRODUODENOSCOPY (EGD) WITH PROPOFOL N/A 12/31/2018   Procedure: ESOPHAGOGASTRODUODENOSCOPY (EGD) WITH PROPOFOL;  Surgeon: Rogene Houston, MD;  Location: [facility];  Service: Endoscopy;  Laterality: N/A;  . tooth removal  2019   all teeth removed  . TUBAL LIGATION     X3    Family Psychiatric History: see below  Family History:  Family History  Problem  Relation Age of Onset  . Hypertension Mother   . Alcohol abuse Mother   . Heart disease Mother   . Kidney disease Mother   . Hypertension Father   . Alcohol abuse Sister   . Stroke Other   . Diabetes Other   . Cancer Other   . Seizures Other   . Alcohol abuse Maternal Aunt   . Alcohol abuse Paternal Aunt   . Alcohol abuse Maternal Grandfather   . Alcohol abuse Maternal Grandmother   . Alcohol abuse Cousin   . Hearing loss Daughter     Social History:  Social History   Socioeconomic History  . Marital status: Married    Spouse name: Not on file  . Number of children: Not on file  . Years of education: Not on file  . Highest education level: Not on file  Occupational History  . Not on file  Tobacco Use  . Smoking status: Never Smoker  . Smokeless tobacco: Never Used  Vaping Use  . Vaping Use: Never used  Substance and Sexual Activity  . Alcohol use: No    Alcohol/week: 0.0 standard drinks  . Drug use: No  . Sexual activity: Not Currently    Birth control/protection: Surgical  Other Topics Concern  . Not on file  Social History Narrative   Married for 22 years.On disability secondary to schizophrenia.Lives with husband.   Social Determinants of Health   Financial Resource Strain: Not on file  Food Insecurity: Not on file  Transportation Needs: Not on file  Physical Activity: Not on file  Stress: Not on file  Social Connections: Not on file    Allergies: No Known Allergies  Metabolic Disorder Labs: Lab Results  Component Value Date   HGBA1C 10.9 (A) 10/25/2019   MPG  07/19/2019     Comment:     eAG cannot be calculated. Hemoglobin A1c result exceeds the linearity of the assay.    MPG 346 04/07/2019   No results found for: PROLACTIN Lab Results  Component Value Date   CHOL 112 06/28/2020   TRIG 108 06/28/2020   HDL 51 06/28/2020   CHOLHDL 2.2 06/28/2020   LDLCALC 42 06/28/2020   LDLCALC 78 04/07/2019   Lab Results  Component Value Date   TSH  1.18 06/28/2020   TSH 1.26 02/07/2020    Therapeutic Level Labs: Lab Results  Component Value Date   LITHIUM <0.06 (L) 07/03/2016   LITHIUM 1.17 09/09/2015   No results found for: VALPROATE No components found for:  CBMZ  Current Medications: Current Outpatient Medications  Medication Sig Dispense Refill  . albuterol (VENTOLIN HFA) 108 (90 Base) MCG/ACT inhaler INHALE 2 PUFFS INTO THE LUNGS EVERY 6 HOURS AS NEEDED FOR WHEEZING OR SHORTNESS OF BREATH 6.7 g 1  . aspirin EC 81 MG tablet Take 81 mg by mouth daily.    Marland Kitchen atorvastatin (LIPITOR) 10 MG tablet TAKE 1 TABLET BY MOUTH AT BEDTIME 90 tablet 1  . benztropine (COGENTIN) 1 MG tablet TAKE 1 TABLET(1 MG) BY MOUTH AT BEDTIME 30 tablet 2  . carvedilol (  COREG) 12.5 MG tablet Take 1 tablet (12.5 mg total) by mouth 2 (two) times daily. 180 tablet 1  . Cholecalciferol (VITAMIN D-3) 125 MCG (5000 UT) TABS Take 2 tablets by mouth daily. 30 tablet 1  . clonazePAM (KLONOPIN) 0.5 MG tablet Take 1 tablet (0.5 mg total) by mouth 2 (two) times daily as needed for anxiety. 60 tablet 2  . DULoxetine (CYMBALTA) 60 MG capsule Take 1 capsule (60 mg total) by mouth 2 (two) times daily. 60 capsule 2  . Exenatide ER (BYDUREON BCISE) 2 MG/0.85ML AUIJ Inject 2 mg into the skin every Monday. 4 pen 2  . FLOVENT HFA 44 MCG/ACT inhaler Inhale 2 puffs into the lungs in the morning and at bedtime. 1 each 3  . gabapentin (NEURONTIN) 300 MG capsule Take 1 capsule (300 mg total) by mouth 3 (three) times daily. 90 capsule 3  . glipiZIDE (GLUCOTROL XL) 5 MG 24 hr tablet Take 2 tablets (10 mg total) by mouth daily with breakfast. 180 tablet 1  . glucose blood (ACCU-CHEK GUIDE) test strip USE TO CHECK BLOOD SUGAR TWICE DAILY 150 strip 0  . insulin glargine, 2 Unit Dial, (TOUJEO MAX SOLOSTAR) 300 UNIT/ML Solostar Pen Inject 80 Units into the skin daily. 4 pen 2  . LINZESS 290 MCG CAPS capsule TAKE 1 CAPSULE(290 MCG) BY MOUTH DAILY BEFORE BREAKFAST 90 capsule 3  . losartan  (COZAAR) 50 MG tablet TAKE 1 TABLET BY MOUTH EVERY DAY 90 tablet 1  . ondansetron (ZOFRAN) 4 MG tablet Take 1 tablet (4 mg total) by mouth every 6 (six) hours. as needed for nausea or vomiting. 10 tablet 2  . pantoprazole (PROTONIX) 40 MG tablet Take 1 tablet (40 mg total) by mouth daily. 90 tablet 1  . promethazine (PHENERGAN) 25 MG tablet Take 25 mg by mouth every 6 (six) hours as needed for nausea or vomiting.     . risperiDONE (RISPERDAL) 2 MG tablet Take one tablet qam three tablets at bedtime 120 tablet 2  . traZODone (DESYREL) 100 MG tablet TAKE 2 TABLETS(200 MG) BY MOUTH AT BEDTIME 60 tablet 2   No current facility-administered medications for this visit.     Musculoskeletal: Strength & Muscle Tone: within normal limits Gait & Station: normal Patient leans: N/A  Psychiatric Specialty Exam: Review of Systems  All other systems reviewed and are negative.   Last menstrual period 01/23/2013.There is no height or weight on file to calculate BMI.  General Appearance: NA  Eye Contact:  NA  Speech:  Clear and Coherent  Volume:  Normal  Mood:  Euthymic  Affect:  NA  Thought Process:  Goal Directed  Orientation:  Full (Time, Place, and Person)  Thought Content: WDL   Suicidal Thoughts:  No  Homicidal Thoughts:  No  Memory:  Immediate;   Good Recent;   Good Remote;   Fair  Judgement:  Good  Insight:  Fair  Psychomotor Activity:  Normal  Concentration:  Concentration: Good and Attention Span: Good  Recall:  Good  Fund of Knowledge: Good  Language: Good  Akathisia:  No  Handed:  Right  AIMS (if indicated): not done  Assets:  Communication Skills Desire for Improvement Resilience Social Support Talents/Skills  ADL's:  Intact  Cognition: WNL  Sleep:  Good   Screenings: PHQ2-9   [place] Office Visit from 06/28/2020 in [place] Visit from 10/12/2019 in [place] from 08/15/2019 in [facility]  [facility] Office Visit from 08/03/2017  in [place] Patient Outreach Telephone from 03/03/2017 in [place]  PHQ-2 Total Score 4 0 0 0 0  PHQ-9 Total Score 11 - - - -       Assessment and Plan: This patient is a 50 year old female with a history of schizoaffective disorder and posttraumatic stress disorder.  She states that she is feeling much better since we added clonazepam 0.5 mg for anxiety use twice daily.  She will continue this as well as Risperdal 2 mg every morning and 6 mg in the evening for schizophrenia, Cogentin 1 mg at bedtime to prevent side effects from Risperdal, Cymbalta 60 mg twice daily for depression and trazodone 200 mg at bedtime for sleep.  She will return to see me in 3 months.   Levonne Spiller, MD 07/06/2020, 10:04 AM

## 2020-07-10 ENCOUNTER — Other Ambulatory Visit (INDEPENDENT_AMBULATORY_CARE_PROVIDER_SITE_OTHER): Payer: Self-pay | Admitting: Gastroenterology

## 2020-07-10 ENCOUNTER — Telehealth (INDEPENDENT_AMBULATORY_CARE_PROVIDER_SITE_OTHER): Payer: Self-pay | Admitting: *Deleted

## 2020-07-10 MED ORDER — OMEPRAZOLE 40 MG PO CPDR: 40.0000 mg | DELAYED_RELEASE_CAPSULE | Freq: Every day | ORAL | 3 refills | Status: DC

## 2020-07-10 NOTE — Telephone Encounter (Signed)
meds for ulcer not covered by insurance - do we have alternative please call [phone]

## 2020-07-10 NOTE — Telephone Encounter (Signed)
I switched her prescription to omeprazole, no need to take more pantoprazole. Thanks

## 2020-07-10 NOTE — Telephone Encounter (Signed)
Hi Kathryn [name], please check which PPI is covered by her insurance. Thanks

## 2020-07-12 NOTE — Telephone Encounter (Signed)
[facility] Patient was called,unable to reach by phone. Called the patient's pharmacy,[facility] in [place]. The Pharmacist states that the PPI was declined due to the patient's age. This is the response that they got when they tried to run I The pharmacy was ask to reach out to the patient and ask her to follow up with her Insurance to see what they will cover, as I was unable to reach the patient.

## 2020-07-12 NOTE — Telephone Encounter (Signed)
Patient to call back regardign approved PPI by her insurance

## 2020-07-13 ENCOUNTER — Other Ambulatory Visit: Payer: Self-pay

## 2020-07-13 ENCOUNTER — Other Ambulatory Visit (HOSPITAL_COMMUNITY)
Admission: RE | Admit: 2020-07-13 | Discharge: 2020-07-13 | Disposition: A | Discharge status: Home / Self Care | Payer: BC Managed Care – PPO | Source: Ambulatory Visit | Attending: Gastroenterology | Admitting: Gastroenterology

## 2020-07-13 DIAGNOSIS — Z01812 Encounter for preprocedural laboratory examination: Secondary | ICD-10-CM | POA: Diagnosis not present

## 2020-07-13 DIAGNOSIS — Z20822 Contact with and (suspected) exposure to covid-19: Secondary | ICD-10-CM | POA: Insufficient documentation

## 2020-07-13 LAB — SARS CORONAVIRUS 2 (TAT 6-24 HRS): SARS Coronavirus 2: NEGATIVE

## 2020-07-16 ENCOUNTER — Ambulatory Visit (HOSPITAL_COMMUNITY): Admission: RE | Admit: 2020-07-16 | Payer: BC Managed Care – PPO | Source: Ambulatory Visit

## 2020-07-17 ENCOUNTER — Ambulatory Visit (HOSPITAL_COMMUNITY): Payer: BC Managed Care – PPO | Admitting: Certified Registered Nurse Anesthetist

## 2020-07-17 ENCOUNTER — Ambulatory Visit (HOSPITAL_COMMUNITY)
Admission: RE | Admit: 2020-07-17 | Discharge: 2020-07-17 | Disposition: A | Discharge status: Home / Self Care | Payer: BC Managed Care – PPO | Attending: Gastroenterology | Admitting: Gastroenterology

## 2020-07-17 ENCOUNTER — Telehealth (INDEPENDENT_AMBULATORY_CARE_PROVIDER_SITE_OTHER): Payer: Self-pay

## 2020-07-17 ENCOUNTER — Other Ambulatory Visit: Payer: Self-pay

## 2020-07-17 ENCOUNTER — Encounter (HOSPITAL_COMMUNITY): Payer: Self-pay | Admitting: Gastroenterology

## 2020-07-17 ENCOUNTER — Other Ambulatory Visit (HOSPITAL_COMMUNITY): Payer: Self-pay | Admitting: Psychiatry

## 2020-07-17 ENCOUNTER — Encounter (HOSPITAL_COMMUNITY)
Admission: RE | Disposition: A | Discharge status: Home / Self Care | Payer: Self-pay | Source: Home / Self Care | Attending: Gastroenterology

## 2020-07-17 DIAGNOSIS — K649 Unspecified hemorrhoids: Secondary | ICD-10-CM | POA: Diagnosis not present

## 2020-07-17 DIAGNOSIS — Z9119 Patient's noncompliance with other medical treatment and regimen: Secondary | ICD-10-CM | POA: Diagnosis not present

## 2020-07-17 DIAGNOSIS — Z833 Family history of diabetes mellitus: Secondary | ICD-10-CM | POA: Insufficient documentation

## 2020-07-17 DIAGNOSIS — F209 Schizophrenia, unspecified: Secondary | ICD-10-CM | POA: Diagnosis not present

## 2020-07-17 DIAGNOSIS — E1140 Type 2 diabetes mellitus with diabetic neuropathy, unspecified: Secondary | ICD-10-CM | POA: Diagnosis not present

## 2020-07-17 DIAGNOSIS — Z794 Long term (current) use of insulin: Secondary | ICD-10-CM | POA: Diagnosis not present

## 2020-07-17 DIAGNOSIS — Z1211 Encounter for screening for malignant neoplasm of colon: Secondary | ICD-10-CM | POA: Diagnosis not present

## 2020-07-17 DIAGNOSIS — K219 Gastro-esophageal reflux disease without esophagitis: Secondary | ICD-10-CM | POA: Diagnosis not present

## 2020-07-17 DIAGNOSIS — E669 Obesity, unspecified: Secondary | ICD-10-CM | POA: Insufficient documentation

## 2020-07-17 DIAGNOSIS — K581 Irritable bowel syndrome with constipation: Secondary | ICD-10-CM | POA: Insufficient documentation

## 2020-07-17 DIAGNOSIS — Z9049 Acquired absence of other specified parts of digestive tract: Secondary | ICD-10-CM | POA: Insufficient documentation

## 2020-07-17 DIAGNOSIS — E785 Hyperlipidemia, unspecified: Secondary | ICD-10-CM | POA: Insufficient documentation

## 2020-07-17 DIAGNOSIS — Z79899 Other long term (current) drug therapy: Secondary | ICD-10-CM | POA: Insufficient documentation

## 2020-07-17 DIAGNOSIS — K648 Other hemorrhoids: Secondary | ICD-10-CM | POA: Diagnosis not present

## 2020-07-17 DIAGNOSIS — Z8249 Family history of ischemic heart disease and other diseases of the circulatory system: Secondary | ICD-10-CM | POA: Insufficient documentation

## 2020-07-17 DIAGNOSIS — I10 Essential (primary) hypertension: Secondary | ICD-10-CM | POA: Diagnosis not present

## 2020-07-17 DIAGNOSIS — Z7982 Long term (current) use of aspirin: Secondary | ICD-10-CM | POA: Diagnosis not present

## 2020-07-17 HISTORY — PX: FLEXIBLE SIGMOIDOSCOPY: SHX5431

## 2020-07-17 LAB — GLUCOSE, CAPILLARY
Glucose-Capillary: 365 mg/dL — ABNORMAL HIGH (ref 70–99)
Glucose-Capillary: 377 mg/dL — ABNORMAL HIGH (ref 70–99)

## 2020-07-17 SURGERY — SIGMOIDOSCOPY, FLEXIBLE
Anesthesia: General

## 2020-07-17 MED ORDER — BENZTROPINE MESYLATE 1 MG PO TABS: ORAL_TABLET | ORAL | 2 refills | Status: DC

## 2020-07-17 MED ORDER — LIDOCAINE HCL (PF) 2 % IJ SOLN
INTRAMUSCULAR | Status: AC
  Filled 2020-07-17: qty 5

## 2020-07-17 MED ORDER — TRAZODONE HCL 100 MG PO TABS: ORAL_TABLET | ORAL | 2 refills | Status: DC

## 2020-07-17 MED ORDER — PEG 3350-KCL-NA BICARB-NACL 420 G PO SOLR: 4000.0000 mL | ORAL | 0 refills | Status: DC

## 2020-07-17 MED ORDER — INSULIN ASPART 100 UNIT/ML ~~LOC~~ SOLN
10.0000 [IU] | Freq: Once | SUBCUTANEOUS | Status: AC
  Administered 2020-07-17: 10 [IU] via SUBCUTANEOUS
  Filled 2020-07-17: qty 0.1

## 2020-07-17 MED ORDER — RISPERIDONE 2 MG PO TABS: ORAL_TABLET | ORAL | 2 refills | Status: DC

## 2020-07-17 MED ORDER — PROPOFOL 10 MG/ML IV BOLUS
INTRAVENOUS | Status: AC
  Filled 2020-07-17: qty 80

## 2020-07-17 MED ORDER — LACTATED RINGERS IV SOLN: INTRAVENOUS | Status: DC

## 2020-07-17 MED ORDER — CLONAZEPAM 0.5 MG PO TABS: 0.5000 mg | ORAL_TABLET | Freq: Two times a day (BID) | ORAL | 2 refills | Status: DC | PRN

## 2020-07-17 MED ORDER — PROPOFOL 500 MG/50ML IV EMUL
INTRAVENOUS | Status: DC | PRN
  Administered 2020-07-17: 100 ug/kg/min via INTRAVENOUS

## 2020-07-17 MED ORDER — LIDOCAINE HCL (CARDIAC) PF 100 MG/5ML IV SOSY
PREFILLED_SYRINGE | INTRAVENOUS | Status: DC | PRN
  Administered 2020-07-17: 50 mg via INTRATRACHEAL

## 2020-07-17 MED ORDER — PROPOFOL 10 MG/ML IV BOLUS
INTRAVENOUS | Status: DC | PRN
  Administered 2020-07-17: 100 mg via INTRAVENOUS

## 2020-07-17 NOTE — Op Note (Signed)
[facility] Patient Name: Kathryn [name] Procedure Date: 07/17/2020 7:17 AM MRN: [id number] Date of Birth: 1970-11-22 Attending MD: Maylon Peppers ,  CSN: [id number] Age: 50 Admit Type: Outpatient Procedure:                Flexible Sigmoidoscopy Indications:              Screening for malignant neoplasm in the colon Providers:                Maylon Peppers, Janeece Riggers, RN, Kristine L.                            Risa Grill, Technician Referring MD:              Medicines:                Monitored Anesthesia Care Complications:            No immediate complications. Estimated Blood Loss:     Estimated blood loss: none. Procedure:                Pre-Anesthesia Assessment:                           - Prior to the procedure, a History and Physical                            was performed, and patient medications, allergies                            and sensitivities were reviewed. The patient's                            tolerance of previous anesthesia was reviewed.                           - The risks and benefits of the procedure and the                            sedation options and risks were discussed with the                            patient. All questions were answered and informed                            consent was obtained.                           - ASA Grade Assessment: II - A patient with mild                            systemic disease.                           The PCF-HQ190L ([id number]) scope was introduced                            through the anus and advanced to  the the left                            transverse colon. The flexible sigmoidoscopy was                            performed with difficulty due to poor bowel prep.                            The patient tolerated the procedure well. The                            quality of the bowel preparation was poor. Scope                            withdrawal time was 6 minutes. Scope In: 7:49:59  AM Scope Out: 8:00:04 AM Total Procedure Duration: 0 hours 10 minutes 5 seconds  Findings:      The perianal and digital rectal examinations were normal.      The proximal descending colon appeared normal.      Extensive amounts of semi-solid stool was found at the splenic flexure       and in the distal transverse colon, precluding visualization.      Non-bleeding internal hemorrhoids were found during retroflexion. The       hemorrhoids were small. Impression:               - Preparation of the colon was poor.                           - The proximal descending colon is normal.                           - Stool at the splenic flexure and in the distal                            transverse colon.                           - No specimens collected.                           - Hemorrhoids. Moderate Sedation:      Per Anesthesia Care Recommendation:           - Discharge patient to home (ambulatory).                           - Clear liquid diet today.                           - Perfom full colonoscopy tomorrow for screening                            purposes. Procedure Code(s):        --- Professional ---  G0104, Colorectal cancer screening; flexible                            sigmoidoscopy Diagnosis Code(s):        --- Professional ---                           Z12.11, Encounter for screening for malignant                            neoplasm of colon CPT copyright 2019 [employer]. All rights reserved. The codes documented in this report are preliminary and upon coder review may  be revised to meet current compliance requirements. Maylon Peppers, MD Maylon Peppers,  07/17/2020 8:10:11 AM This report has been signed electronically. Number of Addenda: 0

## 2020-07-17 NOTE — Telephone Encounter (Signed)
LeighAnn Hafsa Lohn, CMA  

## 2020-07-17 NOTE — Anesthesia Postprocedure Evaluation (Signed)
Anesthesia Post Note  Patient: Lynsay A Tumbleson  Procedure(s) Performed: [place]  Patient location during evaluation: Endoscopy Anesthesia Type: General Level of consciousness: awake and alert Pain management: pain level controlled Vital Signs Assessment: post-procedure vital signs reviewed and stable Respiratory status: spontaneous breathing, nonlabored ventilation, respiratory function stable and patient connected to nasal cannula oxygen Cardiovascular status: blood pressure returned to baseline and stable Postop Assessment: no apparent nausea or vomiting Anesthetic complications: no   No complications documented.   Last Vitals:  Vitals:   07/17/20 0716 07/17/20 0807  BP: 119/76 103/67  Pulse:  90  Resp: 17 14  Temp: 37 C 36.6 C  SpO2: 97% 100%    Last Pain:  Vitals:   07/17/20 0807  TempSrc: Oral  PainSc:                  Talitha Givens

## 2020-07-17 NOTE — Transfer of Care (Signed)
Immediate Anesthesia Transfer of Care Note  Patient: Kathryn [name]  Procedure(s) Performed: FLEXIBLE SIGMOIDOSCOPY  Patient Location: PACU  Anesthesia Type:General  Level of Consciousness: awake, alert  and oriented  Airway & Oxygen Therapy: Patient Spontanous Breathing  Post-op Assessment: Report given to RN, Post -op Vital signs reviewed and stable and Patient moving all extremities X 4  Post vital signs: Reviewed and stable  Last Vitals:  Vitals Value Taken Time  BP 103/67 07/17/20 0807  Temp 36.6 C 07/17/20 0807  Pulse 90 07/17/20 0807  Resp 14 07/17/20 0807  SpO2 100 % 07/17/20 0807    Last Pain:  Vitals:   07/17/20 0807  TempSrc: Oral  PainSc:       Patients Stated Pain Goal: 7 (24/81/85 9093)  Complications: No complications documented.

## 2020-07-17 NOTE — OR Nursing (Signed)
Spoke with Darius Bump at GI office to send in a prescription for additional prep. Informed patient of arrival time tomorrow at 0800 to repeat colonoscopy. Dr. Jenetta Downer informed patient to remain on clear liquids only today and to avoid sugary drinks due to elevated blood glucose. Pt verbalized understanding. Informed husband as well and he verbalized understanding.   CBG [id number], Dr. Jenetta Downer notified and aware. Informed patient to avoid sugary drinks today and remain on clear liquids.

## 2020-07-17 NOTE — H&P (Signed)
Kathryn [name] is an 50 y.o. female.   Chief Complaint: CRC screening HPI: Kathryn [name] is a 50 y.o. female with past medical history of anxiety, depression, asthma, GERD, hyperlipidemia, hypertension, hypothyroidism, schizoaffective disorder, IBS-C and diabetes, coming for screening colonoscopy. The patient has never had a colonoscopy in the past.  The patient denies having any complaints such as melena, hematochezia, abdominal pain or distention, change in her bowel movement consistency or frequency, no changes in her weight recently.  No family history of colorectal cancer.  Had previous abdominal pain tht has resolved with the intake of PPI daily. Also has chronic constipation due to IBS, managed with Linzess.   Past Medical History:  Diagnosis Date  . Anemia   . Anxiety   . Asthma   . Depression   . GERD (gastroesophageal reflux disease)   . HLD (hyperlipidemia) 04/07/2019  . Hypertension   . Hypothyroidism, adult 04/07/2019  . Neuropathy   . Obesity (BMI 30.0-34.9) 04/07/2019  . Schizoaffective disorder ([place])   . Type II diabetes mellitus, uncontrolled ([place]) 04/27/2019    Past Surgical History:  Procedure Laterality Date  . BREAST SURGERY Right    biopsy  . CESAREAN SECTION    . CHOLECYSTECTOMY  06/02/2012   Procedure: LAPAROSCOPIC CHOLECYSTECTOMY;  Surgeon: Jamesetta So, MD;  Location: [facility];  Service: General;  Laterality: N/A;  Attempted laparoscopic cholecystectomy  . CHOLECYSTECTOMY  06/02/2012   Procedure: CHOLECYSTECTOMY;  Surgeon: Jamesetta So, MD;  Location: [facility];  Service: General;  Laterality: N/A;  converted to open at  0905  . ESOPHAGEAL DILATION  12/31/2018   Procedure: ESOPHAGEAL DILATION;  Surgeon: Rogene Houston, MD;  Location: [facility];  Service: Endoscopy;;  . ESOPHAGOGASTRODUODENOSCOPY (EGD) WITH PROPOFOL N/A 08/24/2015   Procedure: ESOPHAGOGASTRODUODENOSCOPY (EGD) WITH PROPOFOL;  Surgeon: Rogene Houston, MD;  Location: [facility];  Service:  Endoscopy;  Laterality: N/A;  1:10 - Ann to notify pt to arrive at 11:30  . ESOPHAGOGASTRODUODENOSCOPY (EGD) WITH PROPOFOL N/A 12/31/2018   Procedure: ESOPHAGOGASTRODUODENOSCOPY (EGD) WITH PROPOFOL;  Surgeon: Rogene Houston, MD;  Location: [facility];  Service: Endoscopy;  Laterality: N/A;  . tooth removal  2019   all teeth removed  . TUBAL LIGATION     X3    Family History  Problem Relation Age of Onset  . Hypertension Mother   . Alcohol abuse Mother   . Heart disease Mother   . Kidney disease Mother   . Hypertension Father   . Alcohol abuse Sister   . Stroke Other   . Diabetes Other   . Cancer Other   . Seizures Other   . Alcohol abuse Maternal Aunt   . Alcohol abuse Paternal Aunt   . Alcohol abuse Maternal Grandfather   . Alcohol abuse Maternal Grandmother   . Alcohol abuse Cousin   . Hearing loss Daughter    Social History:  reports that she has never smoked. She has never used smokeless tobacco. She reports that she does not drink alcohol and does not use drugs.  Allergies: No Known Allergies  Medications Prior to Admission  Medication Sig Dispense Refill  . aspirin EC 81 MG tablet Take 81 mg by mouth daily.    Marland Kitchen atorvastatin (LIPITOR) 10 MG tablet TAKE 1 TABLET BY MOUTH AT BEDTIME (Patient taking differently: Take 10 mg by mouth daily.) 90 tablet 1  . benztropine (COGENTIN) 1 MG tablet TAKE 1 TABLET(1 MG) BY MOUTH AT BEDTIME (Patient taking  differently: Take 1 mg by mouth at bedtime. TAKE 1 TABLET(1 MG) BY MOUTH AT BEDTIME) 30 tablet 2  . carvedilol (COREG) 12.5 MG tablet Take 1 tablet (12.5 mg total) by mouth 2 (two) times daily. 180 tablet 1  . Cholecalciferol (VITAMIN D-3) 125 MCG (5000 UT) TABS Take 2 tablets by mouth daily. 30 tablet 1  . clonazePAM (KLONOPIN) 0.5 MG tablet Take 1 tablet (0.5 mg total) by mouth 2 (two) times daily as needed for anxiety. 60 tablet 2  . DULoxetine (CYMBALTA) 60 MG capsule Take 1 capsule (60 mg total) by mouth 2 (two) times daily.  60 capsule 2  . Exenatide ER (BYDUREON BCISE) 2 MG/0.85ML AUIJ Inject 2 mg into the skin every Monday.    Marland Kitchen FLOVENT HFA 44 MCG/ACT inhaler Inhale 2 puffs into the lungs in the morning and at bedtime. 1 each 3  . gabapentin (NEURONTIN) 300 MG capsule Take 1 capsule (300 mg total) by mouth 3 (three) times daily. 90 capsule 3  . glipiZIDE (GLUCOTROL XL) 5 MG 24 hr tablet Take 2 tablets (10 mg total) by mouth daily with breakfast. 180 tablet 1  . insulin glargine, 2 Unit Dial, (TOUJEO MAX SOLOSTAR) 300 UNIT/ML Solostar Pen Inject 80 Units into the skin daily. 4 pen 2  . LINZESS 290 MCG CAPS capsule TAKE 1 CAPSULE(290 MCG) BY MOUTH DAILY BEFORE BREAKFAST (Patient taking differently: Take 290 mcg by mouth daily before breakfast.) 90 capsule 3  . losartan (COZAAR) 50 MG tablet TAKE 1 TABLET BY MOUTH EVERY DAY (Patient taking differently: Take 50 mg by mouth daily.) 90 tablet 1  . omeprazole (PRILOSEC) 40 MG capsule Take 1 capsule (40 mg total) by mouth daily. 90 capsule 3  . ondansetron (ZOFRAN) 4 MG tablet Take 1 tablet (4 mg total) by mouth every 6 (six) hours. as needed for nausea or vomiting. (Patient taking differently: Take 4 mg by mouth every 6 (six) hours as needed for nausea or vomiting. as needed for nausea or vomiting.) 10 tablet 2  . promethazine (PHENERGAN) 25 MG tablet Take 25 mg by mouth every 6 (six) hours as needed for nausea or vomiting.     . risperiDONE (RISPERDAL) 2 MG tablet Take one tablet qam three tablets at bedtime (Patient taking differently: Take 2-6 mg by mouth See admin instructions. 2 mg in the morning, 6 mg at bedtime) 120 tablet 2  . traZODone (DESYREL) 100 MG tablet TAKE 2 TABLETS(200 MG) BY MOUTH AT BEDTIME (Patient taking differently: Take 200 mg by mouth at bedtime. TAKE 2 TABLETS(200 MG) BY MOUTH AT BEDTIME) 60 tablet 2  . albuterol (VENTOLIN HFA) 108 (90 Base) MCG/ACT inhaler INHALE 2 PUFFS INTO THE LUNGS EVERY 6 HOURS AS NEEDED FOR WHEEZING OR SHORTNESS OF BREATH  (Patient taking differently: Inhale 2 puffs into the lungs every 6 (six) hours as needed for wheezing or shortness of breath.) 6.7 g 1  . glucose blood (ACCU-CHEK GUIDE) test strip USE TO CHECK BLOOD SUGAR TWICE DAILY 150 strip 0    Results for orders placed or performed during the hospital encounter of 07/17/20 (from the past 48 hour(s))  Glucose, capillary     Status: Abnormal   Collection Time: 07/17/20  7:10 AM  Result Value Ref Range   Glucose-Capillary 365 (H) 70 - 99 mg/dL    Comment: Glucose reference range applies only to samples taken after fasting for at least 8 hours.   No results found.  Review of Systems  Constitutional: Negative.  HENT: Negative.   Eyes: Negative.   Respiratory: Negative.   Cardiovascular: Negative.   Gastrointestinal: Negative.   Endocrine: Negative.   Genitourinary: Negative.   Musculoskeletal: Negative.   Skin: Negative.   Allergic/Immunologic: Negative.   Neurological: Negative.   Hematological: Negative.   Psychiatric/Behavioral: Negative.     Blood pressure 119/76, temperature 98.6 F (37 C), temperature source Oral, resp. rate 17, height 5\' 2"  (1.575 m), weight 68.5 kg, last menstrual period 01/23/2013, SpO2 97 %. Physical Exam  GENERAL: The patient is AO x3, in no acute distress. HEENT: Head is normocephalic and atraumatic. EOMI are intact. Mouth is well hydrated and without lesions. NECK: Supple. No masses LUNGS: Clear to auscultation. No presence of rhonchi/wheezing/rales. Adequate chest expansion HEART: RRR, normal s1 and s2. ABDOMEN: Soft, nontender, no guarding, no peritoneal signs, and nondistended. BS +. No masses.  EXTREMITIES: Without any cyanosis, clubbing, rash, lesions or edema. NEUROLOGIC: AOx3, no focal motor deficit. SKIN: no jaundice, no rashes  Assessment/Plan Kathryn [name] is a 50 y.o. female with past medical history of anxiety, depression, asthma, GERD, hyperlipidemia, hypertension, hypothyroidism,  schizoaffective disorder, IBS-C and diabetes, coming for screening colonoscopy. The patient is at average risk for colorectal cancer.  We will proceed with colonoscopy today.   [name], MD 07/17/2020, 7:33 AM

## 2020-07-17 NOTE — H&P (View-Only) (Signed)
Kathryn [name] is an 50 y.o. female.   Chief Complaint: CRC screening HPI: Kathryn [name] is a 50 y.o. female with past medical history of anxiety, depression, asthma, GERD, hyperlipidemia, hypertension, hypothyroidism, schizoaffective disorder, IBS-C and diabetes, coming for screening colonoscopy. The patient has never had a colonoscopy in the past.  The patient denies having any complaints such as melena, hematochezia, abdominal pain or distention, change in her bowel movement consistency or frequency, no changes in her weight recently.  No family history of colorectal cancer.  Had previous abdominal pain tht has resolved with the intake of PPI daily. Also has chronic constipation due to IBS, managed with Linzess.   Past Medical History:  Diagnosis Date  . Anemia   . Anxiety   . Asthma   . Depression   . GERD (gastroesophageal reflux disease)   . HLD (hyperlipidemia) 04/07/2019  . Hypertension   . Hypothyroidism, adult 04/07/2019  . Neuropathy   . Obesity (BMI 30.0-34.9) 04/07/2019  . Schizoaffective disorder ([place])   . Type II diabetes mellitus, uncontrolled ([place]) 04/27/2019    Past Surgical History:  Procedure Laterality Date  . BREAST SURGERY Right    biopsy  . CESAREAN SECTION    . CHOLECYSTECTOMY  06/02/2012   Procedure: LAPAROSCOPIC CHOLECYSTECTOMY;  Surgeon: Jamesetta So, MD;  Location: [facility];  Service: General;  Laterality: N/A;  Attempted laparoscopic cholecystectomy  . CHOLECYSTECTOMY  06/02/2012   Procedure: CHOLECYSTECTOMY;  Surgeon: Jamesetta So, MD;  Location: [facility];  Service: General;  Laterality: N/A;  converted to open at  0905  . ESOPHAGEAL DILATION  12/31/2018   Procedure: ESOPHAGEAL DILATION;  Surgeon: Rogene Houston, MD;  [facility];  Service: Endoscopy;;  . ESOPHAGOGASTRODUODENOSCOPY (EGD) WITH PROPOFOL N/A 08/24/2015   Procedure: ESOPHAGOGASTRODUODENOSCOPY (EGD) WITH PROPOFOL;  Surgeon: Rogene Houston, MD;  [facility];  Service:  Endoscopy;  Laterality: N/A;  1:10 - Ann to notify pt to arrive at 11:30  . ESOPHAGOGASTRODUODENOSCOPY (EGD) WITH PROPOFOL N/A 12/31/2018   Procedure: ESOPHAGOGASTRODUODENOSCOPY (EGD) WITH PROPOFOL;  Surgeon: Rogene Houston, MD;  [facility];  Service: Endoscopy;  Laterality: N/A;  . tooth removal  2019   all teeth removed  . TUBAL LIGATION     X3    Family History  Problem Relation Age of Onset  . Hypertension Mother   . Alcohol abuse Mother   . Heart disease Mother   . Kidney disease Mother   . Hypertension Father   . Alcohol abuse Sister   . Stroke Other   . Diabetes Other   . Cancer Other   . Seizures Other   . Alcohol abuse Maternal Aunt   . Alcohol abuse Paternal Aunt   . Alcohol abuse Maternal Grandfather   . Alcohol abuse Maternal Grandmother   . Alcohol abuse Cousin   . Hearing loss Daughter    Social History:  reports that she has never smoked. She has never used smokeless tobacco. She reports that she does not drink alcohol and does not use drugs.  Allergies: No Known Allergies  Medications Prior to Admission  Medication Sig Dispense Refill  . aspirin EC 81 MG tablet Take 81 mg by mouth daily.    Marland Kitchen atorvastatin (LIPITOR) 10 MG tablet TAKE 1 TABLET BY MOUTH AT BEDTIME (Patient taking differently: Take 10 mg by mouth daily.) 90 tablet 1  . benztropine (COGENTIN) 1 MG tablet TAKE 1 TABLET(1 MG) BY MOUTH AT BEDTIME (Patient taking  differently: Take 1 mg by mouth at bedtime. TAKE 1 TABLET(1 MG) BY MOUTH AT BEDTIME) 30 tablet 2  . carvedilol (COREG) 12.5 MG tablet Take 1 tablet (12.5 mg total) by mouth 2 (two) times daily. 180 tablet 1  . Cholecalciferol (VITAMIN D-3) 125 MCG (5000 UT) TABS Take 2 tablets by mouth daily. 30 tablet 1  . clonazePAM (KLONOPIN) 0.5 MG tablet Take 1 tablet (0.5 mg total) by mouth 2 (two) times daily as needed for anxiety. 60 tablet 2  . DULoxetine (CYMBALTA) 60 MG capsule Take 1 capsule (60 mg total) by mouth 2 (two) times daily.  60 capsule 2  . Exenatide ER (BYDUREON BCISE) 2 MG/0.85ML AUIJ Inject 2 mg into the skin every Monday.    Marland Kitchen FLOVENT HFA 44 MCG/ACT inhaler Inhale 2 puffs into the lungs in the morning and at bedtime. 1 each 3  . gabapentin (NEURONTIN) 300 MG capsule Take 1 capsule (300 mg total) by mouth 3 (three) times daily. 90 capsule 3  . glipiZIDE (GLUCOTROL XL) 5 MG 24 hr tablet Take 2 tablets (10 mg total) by mouth daily with breakfast. 180 tablet 1  . insulin glargine, 2 Unit Dial, (TOUJEO MAX SOLOSTAR) 300 UNIT/ML Solostar Pen Inject 80 Units into the skin daily. 4 pen 2  . LINZESS 290 MCG CAPS capsule TAKE 1 CAPSULE(290 MCG) BY MOUTH DAILY BEFORE BREAKFAST (Patient taking differently: Take 290 mcg by mouth daily before breakfast.) 90 capsule 3  . losartan (COZAAR) 50 MG tablet TAKE 1 TABLET BY MOUTH EVERY DAY (Patient taking differently: Take 50 mg by mouth daily.) 90 tablet 1  . omeprazole (PRILOSEC) 40 MG capsule Take 1 capsule (40 mg total) by mouth daily. 90 capsule 3  . ondansetron (ZOFRAN) 4 MG tablet Take 1 tablet (4 mg total) by mouth every 6 (six) hours. as needed for nausea or vomiting. (Patient taking differently: Take 4 mg by mouth every 6 (six) hours as needed for nausea or vomiting. as needed for nausea or vomiting.) 10 tablet 2  . promethazine (PHENERGAN) 25 MG tablet Take 25 mg by mouth every 6 (six) hours as needed for nausea or vomiting.     . risperiDONE (RISPERDAL) 2 MG tablet Take one tablet qam three tablets at bedtime (Patient taking differently: Take 2-6 mg by mouth See admin instructions. 2 mg in the morning, 6 mg at bedtime) 120 tablet 2  . traZODone (DESYREL) 100 MG tablet TAKE 2 TABLETS(200 MG) BY MOUTH AT BEDTIME (Patient taking differently: Take 200 mg by mouth at bedtime. TAKE 2 TABLETS(200 MG) BY MOUTH AT BEDTIME) 60 tablet 2  . albuterol (VENTOLIN HFA) 108 (90 Base) MCG/ACT inhaler INHALE 2 PUFFS INTO THE LUNGS EVERY 6 HOURS AS NEEDED FOR WHEEZING OR SHORTNESS OF BREATH  (Patient taking differently: Inhale 2 puffs into the lungs every 6 (six) hours as needed for wheezing or shortness of breath.) 6.7 g 1  . glucose blood (ACCU-CHEK GUIDE) test strip USE TO CHECK BLOOD SUGAR TWICE DAILY 150 strip 0    Results for orders placed or performed during the hospital encounter of 07/17/20 (from the past 48 hour(s))  Glucose, capillary     Status: Abnormal   Collection Time: 07/17/20  7:10 AM  Result Value Ref Range   Glucose-Capillary 365 (H) 70 - 99 mg/dL    Comment: Glucose reference range applies only to samples taken after fasting for at least 8 hours.   No results found.  Review of Systems  Constitutional: Negative.  HENT: Negative.   Eyes: Negative.   Respiratory: Negative.   Cardiovascular: Negative.   Gastrointestinal: Negative.   Endocrine: Negative.   Genitourinary: Negative.   Musculoskeletal: Negative.   Skin: Negative.   Allergic/Immunologic: Negative.   Neurological: Negative.   Hematological: Negative.   Psychiatric/Behavioral: Negative.     Blood pressure 119/76, temperature 98.6 F (37 C), temperature source Oral, resp. rate 17, height 5\' 2"  (1.575 m), weight 68.5 kg, last menstrual period 01/23/2013, SpO2 97 %. Physical Exam  GENERAL: The patient is AO x3, in no acute distress. HEENT: Head is normocephalic and atraumatic. EOMI are intact. Mouth is well hydrated and without lesions. NECK: Supple. No masses LUNGS: Clear to auscultation. No presence of rhonchi/wheezing/rales. Adequate chest expansion HEART: RRR, normal s1 and s2. ABDOMEN: Soft, nontender, no guarding, no peritoneal signs, and nondistended. BS +. No masses.  EXTREMITIES: Without any cyanosis, clubbing, rash, lesions or edema. NEUROLOGIC: AOx3, no focal motor deficit. SKIN: no jaundice, no rashes  Assessment/Plan Kathryn [name] is a 50 y.o. female with past medical history of anxiety, depression, asthma, GERD, hyperlipidemia, hypertension, hypothyroidism,  schizoaffective disorder, IBS-C and diabetes, coming for screening colonoscopy. The patient is at average risk for colorectal cancer.  We will proceed with colonoscopy today.   [name], MD 07/17/2020, 7:33 AM

## 2020-07-17 NOTE — Discharge Instructions (Signed)
Colonoscopy, Adult, Care After This sheet gives you information about how to care for yourself after your procedure. Your doctor may also give you more specific instructions. If you have problems or questions, call your doctor. What can I expect after the procedure? After the procedure, it is common to have:  A small amount of blood in your poop (stool) for 24 hours.  Some gas.  Mild cramping or bloating in your belly (abdomen). Follow these instructions at home: Eating and drinking  Drink enough fluid to keep your pee (urine) pale yellow.  Follow instructions from your doctor about what you cannot eat or drink.  Return to your normal diet as told by your doctor. Avoid heavy or fried foods that are hard to digest.   Activity  Rest as told by your doctor.  Do not sit for a long time without moving. Get up to take short walks every 1-2 hours. This is important. Ask for help if you feel weak or unsteady.  Return to your normal activities as told by your doctor. Ask your doctor what activities are safe for you. To help cramping and bloating:  Try walking around.  Put heat on your belly as told by your doctor. Use the heat source that your doctor recommends, such as a moist heat pack or a heating pad. ? Put a towel between your skin and the heat source. ? Leave the heat on for 20-30 minutes. ? Remove the heat if your skin turns bright red. This is very important if you are unable to feel pain, heat, or cold. You may have a greater risk of getting burned.   General instructions  If you were given a medicine to help you relax (sedative) during your procedure, it can affect you for many hours. Do not drive or use machinery until your doctor says that it is safe.  For the first 24 hours after the procedure: ? Do not sign important documents. ? Do not drink alcohol. ? Do your daily activities more slowly than normal. ? Eat foods that are soft and easy to digest.  Take  over-the-counter or prescription medicines only as told by your doctor.  Keep all follow-up visits as told by your doctor. This is important. Contact a doctor if:  You have blood in your poop 2-3 days after the procedure. Get help right away if:  You have more than a small amount of blood in your poop.  You see large clumps of tissue (blood clots) in your poop.  Your belly is swollen.  You feel like you may vomit (nauseous).  You vomit.  You have a fever.  You have belly pain that gets worse, and medicine does not help your pain. Summary  After the procedure, it is common to have a small amount of blood in your poop. You may also have mild cramping and bloating in your belly.  If you were given a medicine to help you relax (sedative) during your procedure, it can affect you for many hours. Do not drive or use machinery until your doctor says that it is safe.  Get help right away if you have a lot of blood in your poop, feel like you may vomit, have a fever, or have more belly pain. This information is not intended to replace advice given to you by your health care provider. Make sure you discuss any questions you have with your health care provider. Document Revised: 03/18/2019 Document Reviewed: 12/06/2018 Elsevier Patient Education  2021  Kathryn [name] are being discharged to home.  Take a clear liquid diet today.  Your physician has recommended a repeat full colonoscopy tomorrow for screening purposes.

## 2020-07-17 NOTE — Anesthesia Preprocedure Evaluation (Signed)
Anesthesia Evaluation  Patient identified by MRN, date of birth, ID band Patient awake    Reviewed: Allergy & Precautions, H&P , NPO status , Patient's Chart, lab work & pertinent test results, reviewed documented beta blocker date and time   Airway Mallampati: III  TM Distance: >3 FB Neck ROM: full    Dental no notable dental hx. (+) Teeth Intact   Pulmonary asthma ,    Pulmonary exam normal breath sounds clear to auscultation       Cardiovascular Exercise Tolerance: Good hypertension, negative cardio ROS   Rhythm:regular Rate:Normal     Neuro/Psych PSYCHIATRIC DISORDERS Anxiety Depression Schizophrenia negative neurological ROS     GI/Hepatic Neg liver ROS, GERD  Medicated,  Endo/Other  diabetesHypothyroidism   Renal/GU negative Renal ROS  negative genitourinary   Musculoskeletal   Abdominal   Peds  Hematology  (+) Blood dyscrasia, anemia ,   Anesthesia Other Findings   Reproductive/Obstetrics negative OB ROS                             Anesthesia Physical Anesthesia Plan  ASA: II  Anesthesia Plan: General   Post-op Pain Management:    Induction:   PONV Risk Score and Plan: Propofol infusion  Airway Management Planned:   Additional Equipment:   Intra-op Plan:   Post-operative Plan:   Informed Consent: I have reviewed the patients History and Physical, chart, labs and discussed the procedure including the risks, benefits and alternatives for the proposed anesthesia with the patient or authorized representative who has indicated his/her understanding and acceptance.     Dental Advisory Given  Plan Discussed with: CRNA  Anesthesia Plan Comments:         Anesthesia Quick Evaluation

## 2020-07-18 ENCOUNTER — Encounter (HOSPITAL_COMMUNITY): Payer: Self-pay | Admitting: Gastroenterology

## 2020-07-18 ENCOUNTER — Encounter (HOSPITAL_COMMUNITY)
Admission: RE | Disposition: A | Discharge status: Home / Self Care | Payer: Self-pay | Source: Home / Self Care | Attending: Gastroenterology

## 2020-07-18 ENCOUNTER — Ambulatory Visit (HOSPITAL_COMMUNITY): Payer: BC Managed Care – PPO | Admitting: Anesthesiology

## 2020-07-18 ENCOUNTER — Ambulatory Visit (HOSPITAL_BASED_OUTPATIENT_CLINIC_OR_DEPARTMENT_OTHER)
Admission: RE | Admit: 2020-07-18 | Discharge: 2020-07-18 | Disposition: A | Discharge status: Home / Self Care | Payer: BC Managed Care – PPO | Source: Home / Self Care | Attending: Gastroenterology | Admitting: Gastroenterology

## 2020-07-18 ENCOUNTER — Other Ambulatory Visit: Payer: Self-pay

## 2020-07-18 DIAGNOSIS — I10 Essential (primary) hypertension: Secondary | ICD-10-CM | POA: Insufficient documentation

## 2020-07-18 DIAGNOSIS — Z9119 Patient's noncompliance with other medical treatment and regimen: Secondary | ICD-10-CM | POA: Diagnosis not present

## 2020-07-18 DIAGNOSIS — Z811 Family history of alcohol abuse and dependence: Secondary | ICD-10-CM | POA: Insufficient documentation

## 2020-07-18 DIAGNOSIS — Z833 Family history of diabetes mellitus: Secondary | ICD-10-CM | POA: Insufficient documentation

## 2020-07-18 DIAGNOSIS — Z1211 Encounter for screening for malignant neoplasm of colon: Secondary | ICD-10-CM | POA: Diagnosis not present

## 2020-07-18 DIAGNOSIS — Z8249 Family history of ischemic heart disease and other diseases of the circulatory system: Secondary | ICD-10-CM | POA: Insufficient documentation

## 2020-07-18 DIAGNOSIS — E785 Hyperlipidemia, unspecified: Secondary | ICD-10-CM | POA: Insufficient documentation

## 2020-07-18 DIAGNOSIS — K5140 Inflammatory polyps of colon without complications: Secondary | ICD-10-CM | POA: Insufficient documentation

## 2020-07-18 DIAGNOSIS — E1165 Type 2 diabetes mellitus with hyperglycemia: Secondary | ICD-10-CM | POA: Diagnosis not present

## 2020-07-18 DIAGNOSIS — Z7984 Long term (current) use of oral hypoglycemic drugs: Secondary | ICD-10-CM | POA: Insufficient documentation

## 2020-07-18 DIAGNOSIS — D122 Benign neoplasm of ascending colon: Secondary | ICD-10-CM

## 2020-07-18 DIAGNOSIS — Z9049 Acquired absence of other specified parts of digestive tract: Secondary | ICD-10-CM | POA: Diagnosis not present

## 2020-07-18 DIAGNOSIS — Z6827 Body mass index (BMI) 27.0-27.9, adult: Secondary | ICD-10-CM | POA: Insufficient documentation

## 2020-07-18 DIAGNOSIS — E119 Type 2 diabetes mellitus without complications: Secondary | ICD-10-CM | POA: Insufficient documentation

## 2020-07-18 DIAGNOSIS — Z794 Long term (current) use of insulin: Secondary | ICD-10-CM | POA: Insufficient documentation

## 2020-07-18 DIAGNOSIS — Z7982 Long term (current) use of aspirin: Secondary | ICD-10-CM | POA: Insufficient documentation

## 2020-07-18 DIAGNOSIS — E669 Obesity, unspecified: Secondary | ICD-10-CM | POA: Insufficient documentation

## 2020-07-18 DIAGNOSIS — Z820 Family history of epilepsy and other diseases of the nervous system: Secondary | ICD-10-CM | POA: Insufficient documentation

## 2020-07-18 DIAGNOSIS — F419 Anxiety disorder, unspecified: Secondary | ICD-10-CM | POA: Insufficient documentation

## 2020-07-18 DIAGNOSIS — K635 Polyp of colon: Secondary | ICD-10-CM | POA: Diagnosis not present

## 2020-07-18 DIAGNOSIS — Z79899 Other long term (current) drug therapy: Secondary | ICD-10-CM | POA: Insufficient documentation

## 2020-07-18 DIAGNOSIS — F259 Schizoaffective disorder, unspecified: Secondary | ICD-10-CM | POA: Insufficient documentation

## 2020-07-18 DIAGNOSIS — E039 Hypothyroidism, unspecified: Secondary | ICD-10-CM | POA: Insufficient documentation

## 2020-07-18 DIAGNOSIS — E1140 Type 2 diabetes mellitus with diabetic neuropathy, unspecified: Secondary | ICD-10-CM | POA: Diagnosis not present

## 2020-07-18 DIAGNOSIS — Z841 Family history of disorders of kidney and ureter: Secondary | ICD-10-CM | POA: Insufficient documentation

## 2020-07-18 DIAGNOSIS — K649 Unspecified hemorrhoids: Secondary | ICD-10-CM | POA: Diagnosis not present

## 2020-07-18 DIAGNOSIS — F32A Depression, unspecified: Secondary | ICD-10-CM | POA: Insufficient documentation

## 2020-07-18 DIAGNOSIS — K219 Gastro-esophageal reflux disease without esophagitis: Secondary | ICD-10-CM | POA: Diagnosis not present

## 2020-07-18 DIAGNOSIS — K581 Irritable bowel syndrome with constipation: Secondary | ICD-10-CM | POA: Insufficient documentation

## 2020-07-18 HISTORY — PX: COLONOSCOPY WITH PROPOFOL: SHX5780

## 2020-07-18 HISTORY — PX: POLYPECTOMY: SHX149

## 2020-07-18 LAB — HM COLONOSCOPY

## 2020-07-18 SURGERY — COLONOSCOPY WITH PROPOFOL
Anesthesia: General

## 2020-07-18 MED ORDER — SODIUM CHLORIDE 0.9 % IV SOLN: INTRAVENOUS | Status: DC

## 2020-07-18 MED ORDER — LACTATED RINGERS IV SOLN
INTRAVENOUS | Status: DC
  Administered 2020-07-18: 1000 mL via INTRAVENOUS

## 2020-07-18 MED ORDER — PROPOFOL 500 MG/50ML IV EMUL
INTRAVENOUS | Status: DC | PRN
  Administered 2020-07-18: 150 ug/kg/min via INTRAVENOUS

## 2020-07-18 MED ORDER — STERILE WATER FOR IRRIGATION IR SOLN
Status: DC | PRN
  Administered 2020-07-18: 200 mL

## 2020-07-18 MED ORDER — PROPOFOL 10 MG/ML IV BOLUS
INTRAVENOUS | Status: DC | PRN
  Administered 2020-07-18: 50 mg via INTRAVENOUS

## 2020-07-18 MED ORDER — PROPOFOL 10 MG/ML IV BOLUS
INTRAVENOUS | Status: AC
  Filled 2020-07-18: qty 60

## 2020-07-18 NOTE — Interval H&P Note (Signed)
History and Physical Interval Note:  07/18/2020 9:35 AM  Kathryn [name] is a 50 y.o. female with past medical history of anxiety, depression, asthma, GERD, hyperlipidemia, hypertension, hypothyroidism, schizoaffective disorder, IBS-C and diabetes, coming for screening colonoscopy. Had a flexible sigmoidoscopy yesterday, prep was poor so she had a repeat bowel prep yesterday evening.  The patient denies having any complaints such as melena, hematochezia, abdominal pain or distention, change in her bowel movement consistency or frequency, no changes in her weight recently.  No family history of colorectal cancer.  BP (!) 148/93   Pulse 78   Temp 97.9 F (36.6 C) (Oral)   Resp 12   Ht 5\' 2"  (1.575 m)   Wt 68.5 kg   LMP 01/23/2013   SpO2 100%   BMI 27.62 kg/m  GENERAL: The patient is AO x3, in no acute distress. HEENT: Head is normocephalic and atraumatic. EOMI are intact. Mouth is well hydrated and without lesions. NECK: Supple. No masses LUNGS: Clear to auscultation. No presence of rhonchi/wheezing/rales. Adequate chest expansion HEART: RRR, normal s1 and s2. ABDOMEN: Soft, nontender, no guarding, no peritoneal signs, and nondistended. BS +. No masses. EXTREMITIES: Without any cyanosis, clubbing, rash, lesions or edema. NEUROLOGIC: AOx3, no focal motor deficit. SKIN: no jaundice, no rashes  Jolane A Bergerson  has presented today for surgery, with the diagnosis of screening.  The various methods of treatment have been discussed with the patient and family. After consideration of risks, benefits and other options for treatment, the patient has consented to  Procedure(s): COLONOSCOPY WITH PROPOFOL (N/A) as a surgical intervention.  The patient's history has been reviewed, patient examined, no change in status, stable for surgery.  I have reviewed the patient's chart and labs.  Questions were answered to the patient's satisfaction.     [name]

## 2020-07-18 NOTE — Op Note (Signed)
[facility] Patient Name: Kathryn [name] Procedure Date: 07/18/2020 10:48 AM MRN: [id number] Date of Birth: December 13, 1970 Attending MD: Maylon Peppers ,  CSN: [id number] Age: 50 Admit Type: Outpatient Procedure:                Colonoscopy Indications:              Screening for colorectal malignant neoplasm Providers:                Maylon Peppers, Crystal Page, Nelma Rothman,                            Technician Referring MD:              Medicines:                Monitored Anesthesia Care Complications:            No immediate complications. Estimated Blood Loss:     Estimated blood loss: none. Procedure:                Pre-Anesthesia Assessment:                           - Prior to the procedure, a History and Physical                            was performed, and patient medications, allergies                            and sensitivities were reviewed. The patient's                            tolerance of previous anesthesia was reviewed.                           - The risks and benefits of the procedure and the                            sedation options and risks were discussed with the                            patient. All questions were answered and informed                            consent was obtained.                           - ASA Grade Assessment: II - A patient with mild                            systemic disease.                           After obtaining informed consent, the colonoscope                            was passed under direct vision. Throughout the  procedure, the patient's blood pressure, pulse, and                            oxygen saturations were monitored continuously. The                            PCF-[id number] ([id number]) was introduced through the                            anus and advanced to the the cecum, identified by                            appendiceal orifice and ileocecal valve. The                             colonoscopy was performed with difficulty due to                            inadequate bowel prep. Successful completion of the                            procedure was aided by lavage. The patient                            tolerated the procedure well. The quality of the                            bowel preparation was fair. Scope withdrawal time                            was 12 minutes. Scope In: 10:56:16 AM Scope Out: 11:27:11 AM Scope Withdrawal Time: 0 hours 13 minutes 1 second  Total Procedure Duration: 0 hours 30 minutes 55 seconds  Findings:      The perianal and digital rectal examinations were normal.      A 4 mm polyp was found in the ascending colon. The polyp was sessile.       The polyp was removed with a cold snare. Resection and retrieval were       complete.      Solid stool was found in the descending colon, in the transverse colon       and in the ascending colon, interfering with visualization. Lavage of       the area was performed, resulting in clearance with good visualization.      The retroflexed view of the distal rectum and anal verge was normal and       showed no anal or rectal abnormalities. Impression:               - Preparation of the colon was fair.                           - One 4 mm polyp in the ascending colon, removed                            with a cold snare. Resected and  retrieved.                           - Stool in the descending colon, in the transverse                            colon and in the ascending colon.                           - The distal rectum and anal verge are normal on                            retroflexion view. Moderate Sedation:      Per Anesthesia Care Recommendation:           - Discharge patient to home (ambulatory).                           - Resume previous diet.                           - Repeat colonoscopy in 3 years for surveillance                            given prep quality - will need a to day  prep and                            low residue diet for 7 days.                           - Await pathology results. Procedure Code(s):        --- Professional ---                           [phone], Colonoscopy, flexible; with removal of                            tumor(s), polyp(s), or other lesion(s) by snare                            technique Diagnosis Code(s):        --- Professional ---                           Z12.11, Encounter for screening for malignant                            neoplasm of colon                           K63.5, Polyp of colon CPT copyright 2019 American [employer]. All rights reserved. The codes documented in this report are preliminary and upon coder review may  be revised to meet current compliance requirements. Maylon Peppers, MD Maylon Peppers,  07/18/2020 11:34:45 AM This report has been signed electronically. Number of Addenda: 0

## 2020-07-18 NOTE — Transfer of Care (Signed)
Immediate Anesthesia Transfer of Care Note  Patient: Kathryn [name]  Procedure(s) Performed: COLONOSCOPY WITH PROPOFOL (N/A ) POLYPECTOMY INTESTINAL  Patient Location: Endoscopy Unit  Anesthesia Type:General  Level of Consciousness: awake  Airway & Oxygen Therapy: Patient Spontanous Breathing  Post-op Assessment: Report given to RN  Post vital signs: Reviewed and stable  Last Vitals:  Vitals Value Taken Time  BP    Temp    Pulse    Resp    SpO2      Last Pain:  Vitals:   07/18/20 1051  TempSrc:   PainSc: 0-No pain      Patients Stated Pain Goal: 8 (83/38/25 0539)  Complications: No complications documented.

## 2020-07-18 NOTE — Anesthesia Postprocedure Evaluation (Signed)
Anesthesia Post Note  Patient: Kathryn [name]  Procedure(s) Performed: COLONOSCOPY WITH PROPOFOL (N/A ) POLYPECTOMY INTESTINAL  Patient location during evaluation: Endoscopy Anesthesia Type: General Level of consciousness: awake and alert and oriented Pain management: pain level controlled Vital Signs Assessment: post-procedure vital signs reviewed and stable Respiratory status: spontaneous breathing Cardiovascular status: blood pressure returned to baseline and stable Postop Assessment: no apparent nausea or vomiting Anesthetic complications: no   No complications documented.   Last Vitals:  Vitals:   07/18/20 0850 07/18/20 1134  BP: (!) 148/93 (!) 144/92  Pulse: 78 85  Resp: 12 14  Temp: 36.6 C 36.4 C  SpO2: 100% 100%    Last Pain:  Vitals:   07/18/20 1134  TempSrc: Oral  PainSc: 0-No pain                 Hetty Linhart

## 2020-07-18 NOTE — Anesthesia Preprocedure Evaluation (Signed)
Anesthesia Evaluation  Patient identified by MRN, date of birth, ID band Patient awake    Reviewed: Allergy & Precautions, H&P , NPO status , Patient's Chart, lab work & pertinent test results, reviewed documented beta blocker date and time   Airway Mallampati: III  TM Distance: >3 FB Neck ROM: full    Dental no notable dental hx. (+) Teeth Intact   Pulmonary asthma ,    Pulmonary exam normal breath sounds clear to auscultation       Cardiovascular Exercise Tolerance: Good hypertension, negative cardio ROS   Rhythm:regular Rate:Normal     Neuro/Psych PSYCHIATRIC DISORDERS Anxiety Depression Schizophrenia negative neurological ROS     GI/Hepatic Neg liver ROS, GERD  Medicated,  Endo/Other  diabetesHypothyroidism   Renal/GU negative Renal ROS  negative genitourinary   Musculoskeletal   Abdominal   Peds  Hematology  (+) Blood dyscrasia, anemia ,   Anesthesia Other Findings   Reproductive/Obstetrics negative OB ROS                             Anesthesia Physical  Anesthesia Plan  ASA: II  Anesthesia Plan: General   Post-op Pain Management:    Induction:   PONV Risk Score and Plan: Propofol infusion  Airway Management Planned:   Additional Equipment:   Intra-op Plan:   Post-operative Plan:   Informed Consent: I have reviewed the patients History and Physical, chart, labs and discussed the procedure including the risks, benefits and alternatives for the proposed anesthesia with the patient or authorized representative who has indicated his/her understanding and acceptance.     Dental Advisory Given  Plan Discussed with: CRNA  Anesthesia Plan Comments:         Anesthesia Quick Evaluation

## 2020-07-18 NOTE — Discharge Instructions (Signed)
You are being discharged to home.  Resume your previous diet.  Your physician has recommended a repeat colonoscopy in three years for surveillance given prep quality - will need a to day prep and low residue diet for 7 days.  We are waiting for your pathology results.    Colonoscopy, Adult, Care After This sheet gives you information about how to care for yourself after your procedure. Your health care provider may also give you more specific instructions. If you have problems or questions, contact your health care provider. What can I expect after the procedure? After the procedure, it is common to have:  A small amount of blood in your stool for 24 hours after the procedure.  Some gas.  Mild cramping or bloating of your abdomen. Follow these instructions at home: Eating and drinking  Drink enough fluid to keep your urine pale yellow.  Follow instructions from your health care provider about eating or drinking restrictions.  Resume your normal diet as instructed by your health care provider.   Activity  Rest as told by your health care provider.  Avoid sitting for a long time without moving. Get up to take short walks every 1-2 hours. This is important to improve blood flow and breathing. Ask for help if you feel weak or unsteady. Managing cramping and bloating  Try walking around when you have cramps or feel bloated.    General instructions  If you were given a sedative during the procedure, it can affect you for several hours. Do not drive or operate machinery until your health care provider says that it is safe.  For the first 24 hours after the procedure: ? Do not sign important documents. ? Do not drink alcohol. ? Do your regular daily activities at a slower pace than normal. ? Eat soft foods that are easy to digest.  Take over-the-counter and prescription medicines only as told by your health care provider.  Keep all follow-up visits as told by your health care  provider. This is important. Contact a health care provider if:  You have blood in your stool 2-3 days after the procedure. Get help right away if you have:  More than a small spotting of blood in your stool.  Large blood clots in your stool.  Swelling of your abdomen.  Nausea or vomiting.  A fever.  Increasing pain in your abdomen that is not relieved with medicine. Summary  After the procedure, it is common to have a small amount of blood in your stool. You may also have mild cramping and bloating of your abdomen.  If you were given a sedative during the procedure, it can affect you for several hours. Do not drive or operate machinery until your health care provider says that it is safe.  Get help right away if you have a lot of blood in your stool, nausea or vomiting, a fever, or increased pain in your abdomen. This information is not intended to replace advice given to you by your health care provider. Make sure you discuss any questions you have with your health care provider. Document Revised: 05/06/2019 Document Reviewed: 12/06/2018 Elsevier Patient Education  [place].   Colon Polyps  Colon polyps are tissue growths inside the colon, which is part of the large intestine. They are one of the types of polyps that can grow in the body. A polyp may be a round bump or a mushroom-shaped growth. You could have one polyp or more than one. Most  colon polyps are noncancerous (benign). However, some colon polyps can become cancerous over time. Finding and removing the polyps early can help prevent this. What are the causes? The exact cause of colon polyps is not known. What increases the risk? The following factors may make you more likely to develop this condition:  Having a family history of colorectal cancer or colon polyps.  Being older than 50 years of age.  Being younger than 50 years of age and having a significant family history of colorectal cancer or colon  polyps or a genetic condition that puts you at higher risk of getting colon polyps.  Having inflammatory bowel disease, such as ulcerative colitis or Crohn's disease.  Having certain conditions passed from parent to child (hereditary conditions), such as: ? Familial adenomatous polyposis (FAP). ? Lynch syndrome. ? Turcot syndrome. ? Peutz-Jeghers syndrome. ? MUTYH-associated polyposis (MAP).  Being overweight.  Certain lifestyle factors. These include smoking cigarettes, drinking too much alcohol, not getting enough exercise, and eating a diet that is high in fat and red meat and low in fiber.  Having had childhood cancer that was treated with radiation of the abdomen. What are the signs or symptoms? Many times, there are no symptoms. If you have symptoms, they may include:  Blood coming from the rectum during a bowel movement.  Blood in the stool (feces). The blood may be bright red or very dark in color.  Pain in the abdomen.  A change in bowel habits, such as constipation or diarrhea. How is this diagnosed? This condition is diagnosed with a colonoscopy. This is a procedure in which a lighted, flexible scope is inserted into the opening between the buttocks (anus) and then passed into the colon to examine the area. Polyps are sometimes found when a colonoscopy is done as part of routine cancer screening tests. How is this treated? This condition is treated by removing any polyps that are found. Most polyps can be removed during a colonoscopy. Those polyps will then be tested for cancer. Additional treatment may be needed depending on the results of testing. Follow these instructions at home: Eating and drinking  Eat foods that are high in fiber, such as fruits, vegetables, and whole grains.  Eat foods that are high in calcium and vitamin D, such as milk, cheese, yogurt, eggs, liver, fish, and broccoli.  Limit foods that are high in fat, such as fried foods and  desserts.  Limit the amount of red meat, precooked or cured meat, or other processed meat that you eat, such as hot dogs, sausages, bacon, or meat loaves.  Limit sugary drinks.   Lifestyle  Maintain a healthy weight, or lose weight if recommended by your health care provider.  Exercise every day or as told by your health care provider.  Do not use any products that contain nicotine or tobacco, such as cigarettes, e-cigarettes, and chewing tobacco. If you need help quitting, ask your health care provider.  Do not drink alcohol if: ? Your health care provider tells you not to drink. ? You are pregnant, may be pregnant, or are planning to become pregnant.  If you drink alcohol: ? Limit how much you use to:  0-1 drink a day for women.  0-2 drinks a day for men. ? Know how much alcohol is in your drink. In the [place] one drink equals one 12 oz bottle of beer (355 mL), one 5 oz glass of wine (148 mL), or one 1 oz glass of hard liquor (  44 mL). General instructions  Take over-the-counter and prescription medicines only as told by your health care provider.  Keep all follow-up visits. This is important. This includes having regularly scheduled colonoscopies. Talk to your health care provider about when you need a colonoscopy. Contact a health care provider if:  You have new or worsening bleeding during a bowel movement.  You have new or increased blood in your stool.  You have a change in bowel habits.  You lose weight for no known reason. Summary  Colon polyps are tissue growths inside the colon, which is part of the large intestine. They are one type of polyp that can grow in the body.  Most colon polyps are noncancerous (benign), but some can become cancerous over time.  This condition is diagnosed with a colonoscopy.  This condition is treated by removing any polyps that are found. Most polyps can be removed during a colonoscopy. This information is not intended to replace  advice given to you by your health care provider. Make sure you discuss any questions you have with your health care provider. Document Revised: 08/31/2019 Document Reviewed: 08/31/2019 Elsevier Patient Education  2021 [employer].

## 2020-07-19 LAB — SURGICAL PATHOLOGY

## 2020-07-20 ENCOUNTER — Encounter (HOSPITAL_COMMUNITY): Payer: Self-pay | Admitting: Gastroenterology

## 2020-07-20 ENCOUNTER — Other Ambulatory Visit (HOSPITAL_COMMUNITY): Payer: Self-pay | Admitting: Psychiatry

## 2020-07-24 ENCOUNTER — Other Ambulatory Visit (HOSPITAL_COMMUNITY): Payer: Self-pay | Admitting: Psychiatry

## 2020-07-24 ENCOUNTER — Encounter (INDEPENDENT_AMBULATORY_CARE_PROVIDER_SITE_OTHER): Payer: Self-pay | Admitting: *Deleted

## 2020-07-24 DIAGNOSIS — J4540 Moderate persistent asthma, uncomplicated: Secondary | ICD-10-CM

## 2020-07-24 MED ORDER — DULOXETINE HCL 60 MG PO CPEP: 60.0000 mg | ORAL_CAPSULE | Freq: Two times a day (BID) | ORAL | 2 refills | Status: DC

## 2020-07-26 ENCOUNTER — Ambulatory Visit (INDEPENDENT_AMBULATORY_CARE_PROVIDER_SITE_OTHER): Payer: BC Managed Care – PPO | Admitting: Internal Medicine

## 2020-07-30 ENCOUNTER — Encounter (INDEPENDENT_AMBULATORY_CARE_PROVIDER_SITE_OTHER): Payer: Self-pay | Admitting: Internal Medicine

## 2020-07-30 ENCOUNTER — Other Ambulatory Visit (HOSPITAL_COMMUNITY): Payer: Self-pay | Admitting: Internal Medicine

## 2020-07-30 ENCOUNTER — Other Ambulatory Visit: Payer: Self-pay

## 2020-07-30 ENCOUNTER — Ambulatory Visit (INDEPENDENT_AMBULATORY_CARE_PROVIDER_SITE_OTHER): Payer: BC Managed Care – PPO | Admitting: Internal Medicine

## 2020-07-30 VITALS — BP 110/78 | HR 85 | Temp 96.6°F | Ht 61.0 in | Wt 155.6 lb

## 2020-07-30 DIAGNOSIS — Z1231 Encounter for screening mammogram for malignant neoplasm of breast: Secondary | ICD-10-CM

## 2020-07-30 DIAGNOSIS — E782 Mixed hyperlipidemia: Secondary | ICD-10-CM

## 2020-07-30 DIAGNOSIS — I10 Essential (primary) hypertension: Secondary | ICD-10-CM | POA: Diagnosis not present

## 2020-07-30 DIAGNOSIS — E1165 Type 2 diabetes mellitus with hyperglycemia: Secondary | ICD-10-CM | POA: Diagnosis not present

## 2020-07-30 NOTE — Progress Notes (Signed)
Metrics: Intervention Frequency ACO  Documented Smoking Status Yearly  Screened one or more times in 24 months  Cessation Counseling or  Active cessation medication Past 24 months  Past 24 months   Guideline developer: UpToDate (See UpToDate for funding source) Date Released: 2014       Wellness Office Visit  Subjective:  Patient ID: Kathryn [name], female    DOB: 05/24/1971  Age: 50 y.o. MRN: [id number]  CC: This lady comes in for follow-up of uncontrolled diabetes, hypertension, dyslipidemia. HPI  She is on several medications for diabetes but has not seen the endocrinologist for 9 months.  She needs to make an appointment. She continues on carvedilol and losartan for hypertension and is tolerating this well. She continues on statin therapy in the face of diabetes.  Thankfully, she does not have a history of coronary artery disease or cerebrovascular disease. Past Medical History:  Diagnosis Date  . Anemia   . Anxiety   . Asthma   . Depression   . GERD (gastroesophageal reflux disease)   . HLD (hyperlipidemia) 04/07/2019  . Hypertension   . Hypothyroidism, adult 04/07/2019  . Neuropathy   . Obesity (BMI 30.0-34.9) 04/07/2019  . Schizoaffective disorder ([place])   . Type II diabetes mellitus, uncontrolled ([place]) 04/27/2019   Past Surgical History:  Procedure Laterality Date  . BREAST SURGERY Right    biopsy  . CESAREAN SECTION    . CHOLECYSTECTOMY  06/02/2012   Procedure: LAPAROSCOPIC CHOLECYSTECTOMY;  Surgeon: Jamesetta So, MD;  Location: [facility];  Service: General;  Laterality: N/A;  Attempted laparoscopic cholecystectomy  . CHOLECYSTECTOMY  06/02/2012   Procedure: CHOLECYSTECTOMY;  Surgeon: Jamesetta So, MD;  Location: [facility];  Service: General;  Laterality: N/A;  converted to open at  0905  . COLONOSCOPY WITH PROPOFOL N/A 07/18/2020   Procedure: COLONOSCOPY WITH PROPOFOL;  Surgeon: [name], MD;  [facility];  Service: Gastroenterology;   Laterality: N/A;  . ESOPHAGEAL DILATION  12/31/2018   Procedure: ESOPHAGEAL DILATION;  Surgeon: Rogene Houston, MD;  [facility];  Service: Endoscopy;;  . ESOPHAGOGASTRODUODENOSCOPY (EGD) WITH PROPOFOL N/A 08/24/2015   Procedure: ESOPHAGOGASTRODUODENOSCOPY (EGD) WITH PROPOFOL;  Surgeon: Rogene Houston, MD;  [facility];  Service: Endoscopy;  Laterality: N/A;  1:10 - Ann to notify pt to arrive at 11:30  . ESOPHAGOGASTRODUODENOSCOPY (EGD) WITH PROPOFOL N/A 12/31/2018   Procedure: ESOPHAGOGASTRODUODENOSCOPY (EGD) WITH PROPOFOL;  Surgeon: Rogene Houston, MD;  [facility];  Service: Endoscopy;  Laterality: N/A;  . FLEXIBLE SIGMOIDOSCOPY  07/17/2020   Procedure: FLEXIBLE SIGMOIDOSCOPY;  Surgeon: [name], MD;  [facility];  Service: Gastroenterology;;  . POLYPECTOMY  07/18/2020   Procedure: POLYPECTOMY INTESTINAL;  Surgeon: [name], [name], MD;  [facility];  Service: Gastroenterology;;  ascending colon polyp;   . tooth removal  2019   all teeth removed  . TUBAL LIGATION     X3     Family History  Problem Relation Age of Onset  . Hypertension Mother   . Alcohol abuse Mother   . Heart disease Mother   . Kidney disease Mother   . Hypertension Father   . Alcohol abuse Sister   . Stroke Other   . Diabetes Other   . Cancer Other   . Seizures Other   . Alcohol abuse Maternal Aunt   . Alcohol abuse Paternal Aunt   . Alcohol abuse Maternal Grandfather   . Alcohol  abuse Maternal Grandmother   . Alcohol abuse Cousin   . Hearing loss Daughter     Social History   Social History Narrative   Married for 22 years.On disability secondary to schizophrenia.Lives with husband.   Social History   Tobacco Use  . Smoking status: Never Smoker  . Smokeless tobacco: Never Used  Substance Use Topics  . Alcohol use: No    Alcohol/week: 0.0 standard drinks    Current Meds  Medication Sig  . albuterol (VENTOLIN  HFA) 108 (90 Base) MCG/ACT inhaler INHALE 2 PUFFS INTO THE LUNGS EVERY 6 HOURS AS NEEDED FOR WHEEZING OR SHORTNESS OF BREATH (Patient taking differently: Inhale 2 puffs into the lungs every 6 (six) hours as needed for wheezing or shortness of breath.)  . aspirin EC 81 MG tablet Take 81 mg by mouth daily.  Marland Kitchen atorvastatin (LIPITOR) 10 MG tablet TAKE 1 TABLET BY MOUTH AT BEDTIME (Patient taking differently: Take 10 mg by mouth daily.)  . benztropine (COGENTIN) 1 MG tablet TAKE 1 TABLET(1 MG) BY MOUTH AT BEDTIME  . carvedilol (COREG) 12.5 MG tablet Take 1 tablet (12.5 mg total) by mouth 2 (two) times daily.  . Cholecalciferol (VITAMIN D-3) 125 MCG (5000 UT) TABS Take 2 tablets by mouth daily.  . clonazePAM (KLONOPIN) 0.5 MG tablet Take 1 tablet (0.5 mg total) by mouth 2 (two) times daily as needed for anxiety.  . DULoxetine (CYMBALTA) 60 MG capsule Take 1 capsule (60 mg total) by mouth 2 (two) times daily.  . Exenatide ER (BYDUREON BCISE) 2 MG/0.85ML AUIJ Inject 2 mg into the skin every Monday.  Marland Kitchen FLOVENT HFA 44 MCG/ACT inhaler Inhale 2 puffs into the lungs in the morning and at bedtime.  Marland Kitchen glipiZIDE (GLUCOTROL XL) 5 MG 24 hr tablet Take 2 tablets (10 mg total) by mouth daily with breakfast.  . glucose blood (ACCU-CHEK GUIDE) test strip USE TO CHECK BLOOD SUGAR TWICE DAILY  . insulin glargine, 2 Unit Dial, (TOUJEO MAX SOLOSTAR) 300 UNIT/ML Solostar Pen Inject 80 Units into the skin daily.  Marland Kitchen LINZESS 290 MCG CAPS capsule TAKE 1 CAPSULE(290 MCG) BY MOUTH DAILY BEFORE BREAKFAST (Patient taking differently: Take 290 mcg by mouth daily before breakfast.)  . losartan (COZAAR) 50 MG tablet TAKE 1 TABLET BY MOUTH EVERY DAY (Patient taking differently: Take 50 mg by mouth daily.)  . omeprazole (PRILOSEC) 40 MG capsule Take 1 capsule (40 mg total) by mouth daily.  . ondansetron (ZOFRAN) 4 MG tablet Take 1 tablet (4 mg total) by mouth every 6 (six) hours. as needed for nausea or vomiting. (Patient taking  differently: Take 4 mg by mouth every 6 (six) hours as needed for nausea or vomiting. as needed for nausea or vomiting.)  . promethazine (PHENERGAN) 25 MG tablet Take 25 mg by mouth every 6 (six) hours as needed for nausea or vomiting.   . risperiDONE (RISPERDAL) 2 MG tablet Take one tablet qam three tablets at bedtime  . traZODone (DESYREL) 100 MG tablet TAKE 2 TABLETS(200 MG) BY MOUTH AT BEDTIME     Flowsheet Row Office Visit from 06/28/2020 in [facility]  PHQ-9 Total Score 11      Objective:   Today's Vitals: BP 110/78   Pulse 85   Temp (!) 96.6 F (35.9 C) (Temporal)   Ht 5\' 1"  (1.549 m)   Wt 155 lb 9.6 oz (70.6 kg)   LMP 01/23/2013   SpO2 98%   BMI 29.40 kg/m  Vitals with BMI 07/30/2020 07/18/2020  07/18/2020  Height 5\' 1"  - 5\' 2"   Weight 155 lbs 10 oz - 151 lbs  BMI 48.18 - 56.31  Systolic 497 026 378  Diastolic 78 92 93  Pulse 85 85 78  Some encounter information is confidential and restricted. Go to Review Flowsheets activity to see all data.     Physical Exam  She remains overweight and has gained more weight.  Blood pressure is in good control.     Assessment   1. Uncontrolled type 2 diabetes mellitus with hyperglycemia ([place])   2. Essential hypertension, benign   3. Mixed hyperlipidemia       Tests ordered Orders Placed This Encounter  Procedures  . COMPLETE METABOLIC PANEL WITH GFR  . Hemoglobin A1c  . Lipid panel     Plan: 1. She will continue with all the diabetic medications and we will check an A1c.  I have urged her to make an appointment to see her endocrinologist. 2. She will continue with losartan and carvedilol for hypertension which is controlling her blood pressure. 3. She will continue on statin therapy and we will check a lipid panel. 4. Follow-up with Judson Roch in about 3 to 4 months time.   No orders of the defined types were placed in this encounter.   Doree Albee, MD

## 2020-07-31 ENCOUNTER — Ambulatory Visit (INDEPENDENT_AMBULATORY_CARE_PROVIDER_SITE_OTHER): Payer: BC Managed Care – PPO | Admitting: Cardiology

## 2020-07-31 ENCOUNTER — Encounter: Payer: Self-pay | Admitting: Cardiology

## 2020-07-31 ENCOUNTER — Encounter: Payer: Self-pay | Admitting: *Deleted

## 2020-07-31 ENCOUNTER — Telehealth (INDEPENDENT_AMBULATORY_CARE_PROVIDER_SITE_OTHER): Payer: Self-pay

## 2020-07-31 VITALS — BP 138/80 | HR 88 | Ht 62.0 in | Wt 156.6 lb

## 2020-07-31 DIAGNOSIS — R079 Chest pain, unspecified: Secondary | ICD-10-CM | POA: Diagnosis not present

## 2020-07-31 DIAGNOSIS — I10 Essential (primary) hypertension: Secondary | ICD-10-CM | POA: Diagnosis not present

## 2020-07-31 DIAGNOSIS — E782 Mixed hyperlipidemia: Secondary | ICD-10-CM | POA: Diagnosis not present

## 2020-07-31 LAB — HEMOGLOBIN A1C
Hgb A1c MFr Bld: 13.2 % of total Hgb — ABNORMAL HIGH (ref <5.7)
Mean Plasma Glucose: 332 mg/dL
eAG (mmol/L): 18.4 mmol/L

## 2020-07-31 LAB — COMPLETE METABOLIC PANEL WITH GFR
AG Ratio: 1.3 (calc) (ref 1.0–2.5)
ALT: 13 U/L (ref 6–29)
AST: 9 U/L — ABNORMAL LOW (ref 10–35)
Albumin: 3.9 g/dL (ref 3.6–5.1)
Alkaline phosphatase (APISO): 75 U/L (ref 31–125)
BUN: 14 mg/dL (ref 7–25)
CO2: 28 mmol/L (ref 20–32)
Calcium: 9.3 mg/dL (ref 8.6–10.2)
Chloride: 102 mmol/L (ref 98–110)
Creat: 0.9 mg/dL (ref 0.50–1.10)
GFR, Est African American: 87 mL/min/{1.73_m2} (ref >=60)
GFR, Est Non African American: 75 mL/min/{1.73_m2} (ref >=60)
Globulin: 2.9 g/dL (calc) (ref 1.9–3.7)
Glucose, Bld: 422 mg/dL — ABNORMAL HIGH (ref 65–139)
Potassium: 3.8 mmol/L (ref 3.5–5.3)
Sodium: 136 mmol/L (ref 135–146)
Total Bilirubin: 0.7 mg/dL (ref 0.2–1.2)
Total Protein: 6.8 g/dL (ref 6.1–8.1)

## 2020-07-31 LAB — LIPID PANEL
Cholesterol: 107 mg/dL (ref <200)
HDL: 53 mg/dL (ref >=50)
LDL Cholesterol (Calc): 37 mg/dL (calc)
Non-HDL Cholesterol (Calc): 54 mg/dL (calc) (ref <130)
Total CHOL/HDL Ratio: 2 (calc) (ref <5.0)
Triglycerides: 89 mg/dL (ref <150)

## 2020-07-31 NOTE — Telephone Encounter (Signed)
Yes, I saw that.  Please let the patient know that she needs to call her endocrinologist ASAP so that they can treat her diabetes.

## 2020-07-31 NOTE — Progress Notes (Signed)
Clinical Summary Ms. Sweetser is a 50 y.o.female seen for follow up of the following medical problems.  1. Chest pain 01/2017 Echo LVEF 60-65%, no WMAs, small effusion. Diastolic function not described. By reported parameters abnormal indeterminate grade.  03/2017 nuclear stress no ischemia.   - sharp pain LUQ, 10/10. Can occur at rest or with exertion. Worst with movement, breathing.  - lasts few minutes, occurs daily - SOB improved since last visit  Recent left sided chest pain, sharp pain. Can occur at rest or with exertion. Worst with position. Lasts about 5 minutes. Can feel, hot, sweaty. Different from prior pain.  -some recent DOE.    2. HTN - compliant with meds - yesterday pcp visit 110/78. Just took meds this AM   3. Hyperlipidemia - 07/2020 TC 107 HDL 53 TG 89 LDL 37 - she is compliant with statin  4. DM2 - poorly controlled, recent Hgb A1c 13.2 - followed by pcp, is to reestablish with endocrine.  Past Medical History:  Diagnosis Date  . Anemia   . Anxiety   . Asthma   . Depression   . GERD (gastroesophageal reflux disease)   . HLD (hyperlipidemia) 04/07/2019  . Hypertension   . Hypothyroidism, adult 04/07/2019  . Neuropathy   . Obesity (BMI 30.0-34.9) 04/07/2019  . Schizoaffective disorder ([place])   . Type II diabetes mellitus, uncontrolled ([place]) 04/27/2019     No Known Allergies   Current Outpatient Medications  Medication Sig Dispense Refill  . albuterol (VENTOLIN HFA) 108 (90 Base) MCG/ACT inhaler INHALE 2 PUFFS INTO THE LUNGS EVERY 6 HOURS AS NEEDED FOR WHEEZING OR SHORTNESS OF BREATH (Patient taking differently: Inhale 2 puffs into the lungs every 6 (six) hours as needed for wheezing or shortness of breath.) 6.7 g 1  . aspirin EC 81 MG tablet Take 81 mg by mouth daily.    Marland Kitchen atorvastatin (LIPITOR) 10 MG tablet TAKE 1 TABLET BY MOUTH AT BEDTIME (Patient taking differently: Take 10 mg by mouth daily.) 90 tablet 1  . benztropine (COGENTIN) 1 MG  tablet TAKE 1 TABLET(1 MG) BY MOUTH AT BEDTIME 90 tablet 2  . carvedilol (COREG) 12.5 MG tablet Take 1 tablet (12.5 mg total) by mouth 2 (two) times daily. 180 tablet 1  . Cholecalciferol (VITAMIN D-3) 125 MCG (5000 UT) TABS Take 2 tablets by mouth daily. 30 tablet 1  . clonazePAM (KLONOPIN) 0.5 MG tablet Take 1 tablet (0.5 mg total) by mouth 2 (two) times daily as needed for anxiety. 180 tablet 2  . DULoxetine (CYMBALTA) 60 MG capsule Take 1 capsule (60 mg total) by mouth 2 (two) times daily. 180 capsule 2  . Exenatide ER (BYDUREON BCISE) 2 MG/0.85ML AUIJ Inject 2 mg into the skin every Monday.    Marland Kitchen FLOVENT HFA 44 MCG/ACT inhaler Inhale 2 puffs into the lungs in the morning and at bedtime. 1 each 3  . gabapentin (NEURONTIN) 300 MG capsule Take 1 capsule (300 mg total) by mouth 3 (three) times daily. 90 capsule 3  . glipiZIDE (GLUCOTROL XL) 5 MG 24 hr tablet Take 2 tablets (10 mg total) by mouth daily with breakfast. 180 tablet 1  . glucose blood (ACCU-CHEK GUIDE) test strip USE TO CHECK BLOOD SUGAR TWICE DAILY 150 strip 0  . insulin glargine, 2 Unit Dial, (TOUJEO MAX SOLOSTAR) 300 UNIT/ML Solostar Pen Inject 80 Units into the skin daily. 4 pen 2  . LINZESS 290 MCG CAPS capsule TAKE 1 CAPSULE(290 MCG) BY MOUTH DAILY  BEFORE BREAKFAST (Patient taking differently: Take 290 mcg by mouth daily before breakfast.) 90 capsule 3  . losartan (COZAAR) 50 MG tablet TAKE 1 TABLET BY MOUTH EVERY DAY (Patient taking differently: Take 50 mg by mouth daily.) 90 tablet 1  . omeprazole (PRILOSEC) 40 MG capsule Take 1 capsule (40 mg total) by mouth daily. 90 capsule 3  . ondansetron (ZOFRAN) 4 MG tablet Take 1 tablet (4 mg total) by mouth every 6 (six) hours. as needed for nausea or vomiting. (Patient taking differently: Take 4 mg by mouth every 6 (six) hours as needed for nausea or vomiting. as needed for nausea or vomiting.) 10 tablet 2  . promethazine (PHENERGAN) 25 MG tablet Take 25 mg by mouth every 6 (six) hours as  needed for nausea or vomiting.     . risperiDONE (RISPERDAL) 2 MG tablet Take one tablet qam three tablets at bedtime 360 tablet 2  . traZODone (DESYREL) 100 MG tablet TAKE 2 TABLETS(200 MG) BY MOUTH AT BEDTIME 180 tablet 2   No current facility-administered medications for this visit.     Past Surgical History:  Procedure Laterality Date  . BREAST SURGERY Right    biopsy  . CESAREAN SECTION    . CHOLECYSTECTOMY  06/02/2012   Procedure: LAPAROSCOPIC CHOLECYSTECTOMY;  Surgeon: Jamesetta So, MD;  Location: [facility];  Service: General;  Laterality: N/A;  Attempted laparoscopic cholecystectomy  . CHOLECYSTECTOMY  06/02/2012   Procedure: CHOLECYSTECTOMY;  Surgeon: Jamesetta So, MD;  Location: [facility];  Service: General;  Laterality: N/A;  converted to open at  0905  . COLONOSCOPY WITH PROPOFOL N/A 07/18/2020   Procedure: COLONOSCOPY WITH PROPOFOL;  Surgeon: [name], MD;  [facility];  Service: Gastroenterology;  Laterality: N/A;  . ESOPHAGEAL DILATION  12/31/2018   Procedure: ESOPHAGEAL DILATION;  Surgeon: Rogene Houston, MD;  [facility];  Service: Endoscopy;;  . ESOPHAGOGASTRODUODENOSCOPY (EGD) WITH PROPOFOL N/A 08/24/2015   Procedure: ESOPHAGOGASTRODUODENOSCOPY (EGD) WITH PROPOFOL;  Surgeon: Rogene Houston, MD;  [facility];  Service: Endoscopy;  Laterality: N/A;  1:10 - Ann to notify pt to arrive at 11:30  . ESOPHAGOGASTRODUODENOSCOPY (EGD) WITH PROPOFOL N/A 12/31/2018   Procedure: ESOPHAGOGASTRODUODENOSCOPY (EGD) WITH PROPOFOL;  Surgeon: Rogene Houston, MD;  [facility];  Service: Endoscopy;  Laterality: N/A;  . FLEXIBLE SIGMOIDOSCOPY  07/17/2020   Procedure: FLEXIBLE SIGMOIDOSCOPY;  Surgeon: [name], MD;  [facility];  Service: Gastroenterology;;  . POLYPECTOMY  07/18/2020   Procedure: POLYPECTOMY INTESTINAL;  Surgeon: [name], [name], MD;  [facility];  Service:  Gastroenterology;;  ascending colon polyp;   . tooth removal  2019   all teeth removed  . TUBAL LIGATION     X3     No Known Allergies    Family History  Problem Relation Age of Onset  . Hypertension Mother   . Alcohol abuse Mother   . Heart disease Mother   . Kidney disease Mother   . Hypertension Father   . Alcohol abuse Sister   . Stroke Other   . Diabetes Other   . Cancer Other   . Seizures Other   . Alcohol abuse Maternal Aunt   . Alcohol abuse Paternal Aunt   . Alcohol abuse Maternal Grandfather   . Alcohol abuse Maternal Grandmother   . Alcohol abuse Cousin   . Hearing loss Daughter      Social History Ms. Gassert reports that she  has never smoked. She has never used smokeless tobacco. Ms. Ake reports no history of alcohol use.   Review of Systems CONSTITUTIONAL: No weight loss, fever, chills, weakness or fatigue.  HEENT: Eyes: No visual loss, blurred vision, double vision or yellow sclerae.No hearing loss, sneezing, congestion, runny nose or sore throat.  SKIN: No rash or itching.  CARDIOVASCULAR: per hpi RESPIRATORY: No shortness of breath, cough or sputum.  GASTROINTESTINAL: No anorexia, nausea, vomiting or diarrhea. No abdominal pain or blood.  GENITOURINARY: No burning on urination, no polyuria NEUROLOGICAL: No headache, dizziness, syncope, paralysis, ataxia, numbness or tingling in the extremities. No change in bowel or bladder control.  MUSCULOSKELETAL: No muscle, back pain, joint pain or stiffness.  LYMPHATICS: No enlarged nodes. No history of splenectomy.  PSYCHIATRIC: No history of depression or anxiety.  ENDOCRINOLOGIC: No reports of sweating, cold or heat intolerance. No polyuria or polydipsia.  Marland Kitchen   Physical Examination Today's Vitals   07/31/20 0810  BP: 138/80  Pulse: 88  SpO2: 99%  Weight: 156 lb 9.6 oz (71 kg)  Height: 5\' 2"  (1.575 m)   Body mass index is 28.64 kg/m.  Gen: resting comfortably, no acute distress HEENT: no  scleral icterus, pupils equal round and reactive, no palptable cervical adenopathy,  CV: RRR, no m/r/g, no jvd Resp: Clear to auscultation bilaterally GI: abdomen is soft, non-tender, non-distended, normal bowel sounds, no hepatosplenomegaly MSK: extremities are warm, no edema.  Skin: warm, no rash Neuro:  no focal deficits Psych: appropriate affect   Diagnostic Studies 01/2017 echo Study Conclusions  - Left ventricle: The cavity size was normal. Wall thickness was increased in a pattern of mild LVH. Systolic function was normal. The estimated ejection fraction was in the range of 60% to 65%. Wall motion was normal; there were no regional wall motion abnormalities. - Right atrium: Central venous pressure (est): 3 mm Hg. - Atrial septum: No defect or patent foramen ovale was identified. - Tricuspid valve: There was trivial regurgitation. - Pulmonary arteries: Systolic pressure could not be accurately estimated. - Pericardium, extracardiac: A small pericardial effusion was identified circumferential to the heart.  Impressions:  - Mild LVH with LVEF 60-65%. Indeterminate diastolic function. Trivial tricuspid regurgitation. Small circumferential pericardial effusion.    03/2017 Nuclear stress  Sinus rhythm with nonspecific T wave abnormalities seen throughout study.  The study is normal with normal myocardial perfusion.  This is a low risk study.  Nuclear stress EF: 62%.     Assessment and Plan   1. Chest pain -long history of chest pain, negative stress test in 2018 - recent chest pain symptoms different from prior, some assocaited SOB - mulitple CAD risk factors including very poorly controlled DM2 with HgbA1c 13 - will repeat lexiscan for further evaluatin - EKG today shows SR, no acute ischemic changes  2. HTN - mildly elevated but just took meds, well controlled at recent pcp visit - continue current meds  3. Hyperlipidemia -at  goal, continue statin   F/u 6 months   Arnoldo Lenis, M.D.,

## 2020-07-31 NOTE — Patient Instructions (Signed)
Medication Instructions:  ?Your physician recommends that you continue on your current medications as directed. Please refer to the Current Medication list given to you today. ? ?*If you need a refill on your cardiac medications before your next appointment, please call your pharmacy* ? ? ?Lab Work: ?NONE  ? ?If you have labs (blood work) drawn today and your tests are completely normal, you will receive your results only by: ?MyChart Message (if you have MyChart) OR ?A paper copy in the mail ?If you have any lab test that is abnormal or we need to change your treatment, we will call you to review the results. ? ? ?Testing/Procedures: ?Your physician has requested that you have a lexiscan myoview. For further information please visit [email or web address]. Please follow instruction sheet, as given. ? ? ? ?Follow-Up: ?At [facility], you and your health needs are our priority.  As part of our continuing mission to provide you with exceptional heart care, we have created designated Provider Care Teams.  These Care Teams include your primary Cardiologist (physician) and Advanced Practice Providers (APPs -  Physician Assistants and Nurse Practitioners) who all work together to provide you with the care you need, when you need it. ? ?We recommend signing up for the patient portal called "MyChart".  Sign up information is provided on this After Visit Summary.  MyChart is used to connect with patients for Virtual Visits (Telemedicine).  Patients are able to view lab/test results, encounter notes, upcoming appointments, etc.  Non-urgent messages can be sent to your provider as well.   ?To learn more about what you can do with MyChart, go to [email or web address].   ? ?Your next appointment:   ?6 month(s) ? ?The format for your next appointment:   ?In Person ? ?Provider:   ?Jonathan Branch, MD  ? ? ?Other Instructions ?Thank you for choosing [facility]! ? ?  ?

## 2020-07-31 NOTE — Progress Notes (Signed)
Please call this patient and let her know that her diabetes is extremely uncontrolled and she needs to call the endocrinologist ASAP for an appointment.

## 2020-07-31 NOTE — Telephone Encounter (Signed)
Talked to Kathryn [name] and given her the lab results today. Pt was instruction to call Dr Dorris Fetch office to schedule a appt. Her labs flagged very high for A1c & Glucose.  A1c:13.2 & glucose : 422 HIGH.

## 2020-08-01 NOTE — Telephone Encounter (Signed)
Morning! Thank you. I faxed over [facility] labs end of day . Did you get those this morning?

## 2020-08-01 NOTE — Telephone Encounter (Signed)
Called patient-was forwarded to VM-Left a Message asking for a return call to get on the schedule to be seen ASAP.

## 2020-08-01 NOTE — Telephone Encounter (Signed)
Yes, they were resulted in Epic as well.

## 2020-08-02 ENCOUNTER — Ambulatory Visit (INDEPENDENT_AMBULATORY_CARE_PROVIDER_SITE_OTHER): Payer: BC Managed Care – PPO | Admitting: "Endocrinology

## 2020-08-02 ENCOUNTER — Telehealth (INDEPENDENT_AMBULATORY_CARE_PROVIDER_SITE_OTHER): Payer: Self-pay

## 2020-08-02 ENCOUNTER — Telehealth (INDEPENDENT_AMBULATORY_CARE_PROVIDER_SITE_OTHER): Payer: BC Managed Care – PPO

## 2020-08-02 ENCOUNTER — Encounter: Payer: Self-pay | Admitting: "Endocrinology

## 2020-08-02 ENCOUNTER — Other Ambulatory Visit: Payer: Self-pay

## 2020-08-02 VITALS — BP 153/89 | HR 98 | Ht 62.0 in | Wt 157.0 lb

## 2020-08-02 DIAGNOSIS — E1165 Type 2 diabetes mellitus with hyperglycemia: Secondary | ICD-10-CM | POA: Diagnosis not present

## 2020-08-02 DIAGNOSIS — I10 Essential (primary) hypertension: Secondary | ICD-10-CM | POA: Diagnosis not present

## 2020-08-02 DIAGNOSIS — E039 Hypothyroidism, unspecified: Secondary | ICD-10-CM

## 2020-08-02 DIAGNOSIS — Z9119 Patient's noncompliance with other medical treatment and regimen: Secondary | ICD-10-CM | POA: Insufficient documentation

## 2020-08-02 DIAGNOSIS — Z91199 Patient's noncompliance with other medical treatment and regimen due to unspecified reason: Secondary | ICD-10-CM | POA: Insufficient documentation

## 2020-08-02 DIAGNOSIS — E782 Mixed hyperlipidemia: Secondary | ICD-10-CM | POA: Diagnosis not present

## 2020-08-02 MED ORDER — GLIPIZIDE ER 5 MG PO TB24
5.0000 mg | ORAL_TABLET | Freq: Every day | ORAL | 1 refills | Status: DC
Start: 2020-08-02 — End: 2021-01-07

## 2020-08-02 MED ORDER — ACCU-CHEK GUIDE VI STRP: ORAL_STRIP | 2 refills | Status: DC

## 2020-08-02 MED ORDER — ACCU-CHEK GUIDE ME W/DEVICE KIT: 1.0000 | PACK | 0 refills | Status: DC

## 2020-08-02 NOTE — Progress Notes (Signed)
08/02/2020, 5:37 PM  Endocrinology follow-up note   Subjective:    Patient ID: Kathryn [name], female    DOB: 12-23-1970.  Kathryn [name] is being seen in follow-up after she was seen in consultation for management of currently uncontrolled symptomatic diabetes requested by  Ailene Ards, NP.  She missed her appointment since June 2021.   Past Medical History:  Diagnosis Date  . Anemia   . Anxiety   . Asthma   . Depression   . GERD (gastroesophageal reflux disease)   . HLD (hyperlipidemia) 04/07/2019  . Hypertension   . Hypothyroidism, adult 04/07/2019  . Neuropathy   . Obesity (BMI 30.0-34.9) 04/07/2019  . Schizoaffective disorder ([place])   . Type II diabetes mellitus, uncontrolled ([place]) 04/27/2019    Past Surgical History:  Procedure Laterality Date  . BREAST SURGERY Right    biopsy  . CESAREAN SECTION    . CHOLECYSTECTOMY  06/02/2012   Procedure: LAPAROSCOPIC CHOLECYSTECTOMY;  Surgeon: Jamesetta So, MD;  Location: [facility];  Service: General;  Laterality: N/A;  Attempted laparoscopic cholecystectomy  . CHOLECYSTECTOMY  06/02/2012   Procedure: CHOLECYSTECTOMY;  Surgeon: Jamesetta So, MD;  Location: [facility];  Service: General;  Laterality: N/A;  converted to open at  0905  . COLONOSCOPY WITH PROPOFOL N/A 07/18/2020   Procedure: COLONOSCOPY WITH PROPOFOL;  Surgeon: [name], MD;  Location: [facility];  Service: Gastroenterology;  Laterality: N/A;  . ESOPHAGEAL DILATION  12/31/2018   Procedure: ESOPHAGEAL DILATION;  Surgeon: Rogene Houston, MD;  Location: [facility];  Service: Endoscopy;;  . ESOPHAGOGASTRODUODENOSCOPY (EGD) WITH PROPOFOL N/A 08/24/2015   Procedure: ESOPHAGOGASTRODUODENOSCOPY (EGD) WITH PROPOFOL;  Surgeon: Rogene Houston, MD;  [facility]: [facility];  Service: Endoscopy;  Laterality: N/A;  1:10 - Ann to notify pt to arrive at 11:30  . ESOPHAGOGASTRODUODENOSCOPY (EGD) WITH  PROPOFOL N/A 12/31/2018   Procedure: ESOPHAGOGASTRODUODENOSCOPY (EGD) WITH PROPOFOL;  Surgeon: Rogene Houston, MD;  [facility];  Service: Endoscopy;  Laterality: N/A;  . FLEXIBLE SIGMOIDOSCOPY  07/17/2020   Procedure: FLEXIBLE SIGMOIDOSCOPY;  Surgeon: [name], MD;  Location: [facility];  Service: Gastroenterology;;  . POLYPECTOMY  07/18/2020   Procedure: POLYPECTOMY INTESTINAL;  Surgeon: [name], [name], MD;  Location: [facility];  Service: Gastroenterology;;  ascending colon polyp;   . tooth removal  2019   all teeth removed  . TUBAL LIGATION     X3    Social History   Socioeconomic History  . Marital status: Married    Spouse name: Not on file  . Number of children: Not on file  . Years of education: Not on file  . Highest education level: Not on file  Occupational History  . Not on file  Tobacco Use  . Smoking status: Never Smoker  . Smokeless tobacco: Never Used  Vaping Use  . Vaping Use: Never used  Substance and Sexual Activity  . Alcohol use: No    Alcohol/week: 0.0 standard drinks  . Drug use: No  . Sexual activity: Not Currently    Birth control/protection: Surgical  Other Topics Concern  . Not on file  Social History Narrative   Married for  22 years.On disability secondary to schizophrenia.Lives with husband.   Social Determinants of Health   Financial Resource Strain: Not on file  Food Insecurity: Not on file  Transportation Needs: Not on file  Physical Activity: Not on file  Stress: Not on file  Social Connections: Not on file    Family History  Problem Relation Age of Onset  . Hypertension Mother   . Alcohol abuse Mother   . Heart disease Mother   . Kidney disease Mother   . Hypertension Father   . Alcohol abuse Sister   . Stroke Other   . Diabetes Other   . Cancer Other   . Seizures Other   . Alcohol abuse Maternal Aunt   . Alcohol abuse Paternal Aunt   . Alcohol abuse Maternal Grandfather   .  Alcohol abuse Maternal Grandmother   . Alcohol abuse Cousin   . Hearing loss Daughter     Outpatient Encounter Medications as of 08/02/2020  Medication Sig  . Blood Glucose Monitoring Suppl (ACCU-CHEK GUIDE ME) w/Device KIT 1 Piece by Does not apply route as directed.  Marland Kitchen glucose blood (ACCU-CHEK GUIDE) test strip Use as instructed  . albuterol (VENTOLIN HFA) 108 (90 Base) MCG/ACT inhaler INHALE 2 PUFFS INTO THE LUNGS EVERY 6 HOURS AS NEEDED FOR WHEEZING OR SHORTNESS OF BREATH (Patient taking differently: Inhale 2 puffs into the lungs every 6 (six) hours as needed for wheezing or shortness of breath.)  . aspirin EC 81 MG tablet Take 81 mg by mouth daily.  Marland Kitchen atorvastatin (LIPITOR) 10 MG tablet TAKE 1 TABLET BY MOUTH AT BEDTIME (Patient taking differently: Take 10 mg by mouth daily.)  . benztropine (COGENTIN) 1 MG tablet TAKE 1 TABLET(1 MG) BY MOUTH AT BEDTIME  . carvedilol (COREG) 12.5 MG tablet Take 1 tablet (12.5 mg total) by mouth 2 (two) times daily.  . Cholecalciferol (VITAMIN D-3) 125 MCG (5000 UT) TABS Take 2 tablets by mouth daily.  . clonazePAM (KLONOPIN) 0.5 MG tablet Take 1 tablet (0.5 mg total) by mouth 2 (two) times daily as needed for anxiety.  . DULoxetine (CYMBALTA) 60 MG capsule Take 1 capsule (60 mg total) by mouth 2 (two) times daily.  . Exenatide ER (BYDUREON BCISE) 2 MG/0.85ML AUIJ Inject 2 mg into the skin every Monday.  Marland Kitchen FLOVENT HFA 44 MCG/ACT inhaler Inhale 2 puffs into the lungs in the morning and at bedtime.  . gabapentin (NEURONTIN) 300 MG capsule Take 1 capsule (300 mg total) by mouth 3 (three) times daily.  Marland Kitchen glipiZIDE (GLUCOTROL XL) 5 MG 24 hr tablet Take 1 tablet (5 mg total) by mouth daily with breakfast.  . insulin glargine, 2 Unit Dial, (TOUJEO MAX SOLOSTAR) 300 UNIT/ML Solostar Pen Inject 80 Units into the skin daily.  Marland Kitchen LINZESS 290 MCG CAPS capsule TAKE 1 CAPSULE(290 MCG) BY MOUTH DAILY BEFORE BREAKFAST (Patient taking differently: Take 290 mcg by mouth daily  before breakfast.)  . losartan (COZAAR) 50 MG tablet TAKE 1 TABLET BY MOUTH EVERY DAY (Patient taking differently: Take 50 mg by mouth daily.)  . omeprazole (PRILOSEC) 40 MG capsule Take 1 capsule (40 mg total) by mouth daily.  . ondansetron (ZOFRAN) 4 MG tablet Take 1 tablet (4 mg total) by mouth every 6 (six) hours. as needed for nausea or vomiting. (Patient taking differently: Take 4 mg by mouth every 6 (six) hours as needed for nausea or vomiting. as needed for nausea or vomiting.)  . promethazine (PHENERGAN) 25 MG tablet Take 25 mg by  mouth every 6 (six) hours as needed for nausea or vomiting.   . risperiDONE (RISPERDAL) 2 MG tablet Take one tablet qam three tablets at bedtime  . traZODone (DESYREL) 100 MG tablet TAKE 2 TABLETS(200 MG) BY MOUTH AT BEDTIME  . [DISCONTINUED] glipiZIDE (GLUCOTROL XL) 5 MG 24 hr tablet Take 2 tablets (10 mg total) by mouth daily with breakfast.  . [DISCONTINUED] glucose blood (ACCU-CHEK GUIDE) test strip USE TO CHECK BLOOD SUGAR TWICE DAILY   No facility-administered encounter medications on file as of 08/02/2020.    ALLERGIES: No Known Allergies  VACCINATION STATUS: Immunization History  Administered Date(s) Administered  . Influenza Split 06/03/2012  . Influenza,inj,Quad PF,6+ Mos 04/07/2019, 03/15/2020  . Moderna Sars-Covid-2 Vaccination 09/02/2019, 09/30/2019  . Pneumococcal Polysaccharide-23 06/03/2012  . Tdap 05/24/2014    Diabetes She presents for her follow-up diabetic visit. She has type 2 diabetes mellitus. Onset time: She was diagnosed at approximate age of 72 years. Her disease course has been worsening (Patient was previously seen in this practice however disappeared from care for unclear reasons.). There are no hypoglycemic associated symptoms. Pertinent negatives for hypoglycemia include no confusion, headaches, pallor or seizures. Associated symptoms include fatigue, polydipsia and polyuria. Pertinent negatives for diabetes include no chest  pain and no polyphagia. There are no hypoglycemic complications. Symptoms are worsening. Diabetic complications include peripheral neuropathy. Risk factors for coronary artery disease include dyslipidemia, diabetes mellitus, hypertension, obesity and sedentary lifestyle. Current diabetic treatments: She is currently on Bydureon 2 mg weekly, Toujeo 132 units daily. Her weight is fluctuating minimally. She is following a generally unhealthy diet. When asked about meal planning, she reported none. She has not had a previous visit with a dietitian. She rarely participates in exercise. Her home blood glucose trend is increasing steadily. (She missed her appointment since June 2021.  Presents with no meter nor logs.  Her previsit labs show A1c of 13.2% increasing from 10.9%.   She had A1c as high as greater than 14% last year.) An ACE inhibitor/angiotensin II receptor blocker is being taken. Eye exam is current.  Hyperlipidemia This is a chronic problem. The current episode started more than 1 year ago. The problem is uncontrolled. Recent lipid tests were reviewed and are high. Exacerbating diseases include diabetes and obesity. Pertinent negatives include no chest pain, myalgias or shortness of breath. Current antihyperlipidemic treatment includes statins. Risk factors for coronary artery disease include family history, dyslipidemia, obesity, hypertension, diabetes mellitus and a sedentary lifestyle.  Hypertension This is a chronic problem. The current episode started more than 1 year ago. The problem is controlled. Pertinent negatives include no chest pain, headaches, palpitations or shortness of breath. Risk factors for coronary artery disease include dyslipidemia, diabetes mellitus, obesity, family history and sedentary lifestyle. Past treatments include angiotensin blockers. Identifiable causes of hypertension include a thyroid problem.  Thyroid Problem Presents for initial visit. Onset time: She was diagnosed  at approximate age of 42 years. Symptoms include depressed mood and fatigue. Patient reports no cold intolerance, diarrhea, heat intolerance or palpitations. The symptoms have been worsening. Treatments tried: She is currently on natures Armour 90 mg p.o. daily. Her past medical history is significant for diabetes and hyperlipidemia.     Review of Systems  Constitutional: Positive for fatigue. Negative for chills, fever and unexpected weight change.  HENT: Negative for trouble swallowing and voice change.   Eyes: Negative for visual disturbance.  Respiratory: Negative for cough, shortness of breath and wheezing.   Cardiovascular: Negative for chest pain,  palpitations and leg swelling.  Gastrointestinal: Negative for diarrhea, nausea and vomiting.  Endocrine: Positive for polydipsia and polyuria. Negative for cold intolerance, heat intolerance and polyphagia.  Musculoskeletal: Negative for arthralgias and myalgias.  Skin: Negative for color change, pallor, rash and wound.  Neurological: Negative for seizures and headaches.  Psychiatric/Behavioral: Negative for confusion and suicidal ideas.    Objective:    Vitals with BMI 08/02/2020 07/31/2020 07/30/2020  Height '5\' 2"'  '5\' 2"'  '5\' 1"'   Weight 157 lbs 156 lbs 10 oz 155 lbs 10 oz  BMI 28.71 53.64 68.03  Systolic 212 248 250  Diastolic 89 80 78  Pulse 98 88 85  Some encounter information is confidential and restricted. Go to Review Flowsheets activity to see all data.    BP (!) 153/89   Pulse 98   Ht '5\' 2"'  (1.575 m)   Wt 157 lb (71.2 kg)   LMP 01/23/2013   BMI 28.72 kg/m   Wt Readings from Last 3 Encounters:  08/02/20 157 lb (71.2 kg)  07/31/20 156 lb 9.6 oz (71 kg)  07/30/20 155 lb 9.6 oz (70.6 kg)     Physical Exam Constitutional:      Appearance: She is well-developed.  HENT:     Head: Normocephalic and atraumatic.  Neck:     Thyroid: No thyromegaly.     Trachea: No tracheal deviation.     Comments: She has palpable  thyromegaly. Cardiovascular:     Rate and Rhythm: Normal rate and regular rhythm.  Pulmonary:     Effort: Pulmonary effort is normal.  Abdominal:     General: Abdomen is flat.     Tenderness: There is no abdominal tenderness. There is no guarding.  Musculoskeletal:        General: Normal range of motion.     Cervical back: Normal range of motion and neck supple.  Skin:    General: Skin is warm and dry.     Coloration: Skin is not pale.     Findings: No erythema or rash.  Neurological:     Mental Status: She is alert and oriented to person, place, and time.     Cranial Nerves: No cranial nerve deficit.     Coordination: Coordination normal.     Deep Tendon Reflexes: Reflexes are normal and symmetric.  Psychiatric:        Judgment: Judgment normal.     CMP     Component Value Date/Time   NA 136 07/30/2020 1317   K 3.8 07/30/2020 1317   CL 102 07/30/2020 1317   CO2 28 07/30/2020 1317   GLUCOSE 422 (H) 07/30/2020 1317   BUN 14 07/30/2020 1317   CREATININE 0.90 07/30/2020 1317   CALCIUM 9.3 07/30/2020 1317   PROT 6.8 07/30/2020 1317   ALBUMIN 4.1 02/08/2020 0909   AST 9 (L) 07/30/2020 1317   ALT 13 07/30/2020 1317   ALKPHOS 90 02/08/2020 0909   BILITOT 0.7 07/30/2020 1317   GFRNONAA 75 07/30/2020 1317   GFRAA 87 07/30/2020 1317     Diabetic Labs (most recent): Lab Results  Component Value Date   HGBA1C 13.2 (H) 07/30/2020   HGBA1C 10.9 (A) 10/25/2019   HGBA1C >14.0 (H) 07/19/2019     Lipid Panel ( most recent) Lipid Panel     Component Value Date/Time   CHOL 107 07/30/2020 1317   TRIG 89 07/30/2020 1317   HDL 53 07/30/2020 1317   CHOLHDL 2.0 07/30/2020 1317   LDLCALC 37 07/30/2020 1317  Lab Results  Component Value Date   TSH 1.18 06/28/2020   TSH 1.26 02/07/2020   TSH 1.15 07/19/2019   TSH 1.57 04/07/2019   FREET4 1.3 07/19/2019      Assessment & Plan:   1. Uncontrolled type 2 diabetes mellitus with hyperglycemia ([facility])  - Kathryn [name] has currently uncontrolled symptomatic type 2 DM since  50 years of age.  She missed her appointment since June 2021.  Presents with no meter nor logs.  Her previsit labs show A1c of 13.2% increasing from 10.9%.   She had A1c as high as greater than 14% last year. Recent labs reviewed.  She presents with significantly above target glycemic profile was fasting and postprandial.    Her recent labs show a1c of  >14% .  - I had a long discussion with her about the progressive nature of diabetes and the pathology behind its complications. -her diabetes is complicated by noncompliance/nonadherence, obesity/sedentary life and she remains at exceedingly high risk for more acute and chronic complications which include CAD, CVA, CKD, retinopathy, and neuropathy. These are all discussed in detail with her.  - I have counseled her on diet  and weight management  by adopting a carbohydrate restricted/protein rich diet. Patient is encouraged to switch to  unprocessed or minimally processed     complex starch and increased protein intake (animal or plant source), fruits, and vegetables. -  she is advised to stick to a routine mealtimes to eat 3 meals  a day and avoid unnecessary snacks ( to snack only to correct hypoglycemia).   - she acknowledges that there is a room for improvement in her food and drink choices. - Suggestion is made for her to avoid simple carbohydrates  from her diet including Cakes, Sweet Desserts, Ice Cream, Soda (diet and regular), Sweet Tea, Candies, Chips, Cookies, Store Bought Juices, Alcohol in Excess of  1-2 drinks a day, Artificial Sweeteners,  Coffee Creamer, and "Sugar-free" Products, Lemonade. This will help patient to have more stable blood glucose profile and potentially avoid unintended weight gain.   - she will be scheduled with Jearld Fenton, RDN, CDE for diabetes education.  - I have approached her with the following individualized plan to manage  her diabetes and  patient agrees:   -Patient remains alarmingly noncompliant.  In light of her presentation with significantly above target A1c of 13.2%, she will likely need intensive treatment with basal/bolus insulin in order for her to achieve control of diabetes to target.    -In preparation, she is approached to monitor blood glucose 4 times a day-before meals and at bedtime and return in 10 days with her meter and logs.  New testing supplies prescription was sent to her pharmacy.    -She is advised to resume and continue Toujeo 80 units nightly, continue Bydureon 2 mg subcutaneously weekly, continue glipizide 5 mg p.o. daily at breakfast.   -She is urged to continue monitoring blood glucose strictly 4 times a day-before meals and at bedtime, and as needed.  -She reports intolerance to Metformin.    - she is encouraged to call clinic for blood glucose levels less than 70 or above 300 mg /dl.   - Specific targets for  A1c;  LDL, HDL,  and Triglycerides were discussed with the patient.  2) Blood Pressure /Hypertension: Her blood pressure is not controlled to target.  she is advised to continue her current medications including losartan 50 mg p.o. daily with breakfast .  3)  Lipids/Hyperlipidemia:   Review of her recent lipid panel showed un controlled triglycerides at 362, and LDL at  67.  she  is advised to continue atorvastatin 10 mg p.o. daily at bedtime.  Side effects and precautions discussed with her.  4)  Weight/Diet:  Body mass index is 28.72 kg/m.  -     she is  a candidate for modest weight loss. I discussed with her the fact that loss of 5 - 10% of her  current body weight will have the most impact on her diabetes management.  Exercise, and detailed carbohydrates information provided  -  detailed on discharge instructions.  5) hypothyroidism: Longstanding diagnosis.  A new prescription for her levothyroxine 75 mcg was sent to her pharmacy.  She is advised to continue 25 mcg of levothyroxine daily  before breakfast.     - We discussed about the correct intake of her thyroid hormone, on empty stomach at fasting, with water, separated by at least 30 minutes from breakfast and other medications,  and separated by more than 4 hours from calcium, iron, multivitamins, acid reflux medications (PPIs). -Patient is made aware of the fact that thyroid hormone replacement is needed for life, dose to be adjusted by periodic monitoring of thyroid function tests.  6) Chronic Care/Health Maintenance:  -she  is on ACEI/ARB and Statin medications and  is encouraged to initiate and continue to follow up with Ophthalmology, Dentist,  Podiatrist at least yearly or according to recommendations, and advised to   stay away from smoking. I have recommended yearly flu vaccine and pneumonia vaccine at least every 5 years; moderate intensity exercise for up to 150 minutes weekly; and  sleep for at least 7 hours a day.  - she is  advised to maintain close follow up with Ailene Ards, NP for primary care needs, as well as her other providers for optimal and coordinated care.  - Time spent on this patient care encounter:  45 min, of which > 50% was spent in  counseling and the rest reviewing her blood glucose logs , discussing her hypoglycemia and hyperglycemia episodes, reviewing her current and  previous labs / studies  ( including abstraction from other facilities) and medications  doses and developing a  long term treatment plan and documenting her care.   Please refer to Patient Instructions for Blood Glucose Monitoring and Insulin/Medications Dosing Guide"  in media tab for additional information. Please  also refer to " Patient Self Inventory" in the Media  tab for reviewed elements of pertinent patient history.  Kathryn [name] participated in the discussions, expressed understanding, and voiced agreement with the above plans.  All questions were answered to her satisfaction. she is encouraged to contact clinic should  she have any questions or concerns prior to her return visit.  Follow up plan: - Return in about 10 days (around 08/12/2020) for F/U with Meter and Logs Only - no Labs.  Glade Lloyd, MD [facility] [address] [place], [place] [address] Phone: [phone]  Fax: [phone]    08/02/2020, 5:37 PM  This note was partially dictated with voice recognition software. Similar sounding words can be transcribed inadequately or may not  be corrected upon review.

## 2020-08-02 NOTE — Patient Instructions (Signed)
                                     Advice for Weight Management  -For most of us the best way to lose weight is by diet management. Generally speaking, diet management means consuming less calories intentionally which over time brings about progressive weight loss.  This can be achieved more effectively by restricting carbohydrate consumption to the minimum possible.  So, it is critically important to know your numbers: how much calorie you are consuming and how much calorie you need. More importantly, our carbohydrates sources should be unprocessed or minimally processed complex starch food items.   Sometimes, it is important to balance nutrition by increasing protein intake (animal or plant source), fruits, and vegetables.  -Sticking to a routine mealtime to eat 3 meals a day and avoiding unnecessary snacks is shown to have a big role in weight control. Under normal circumstances, the only time we lose real weight is when we are hungry, so allow hunger to take place- hunger means no food between meal times, only water.  It is not advisable to starve.   -It is better to avoid simple carbohydrates including: Cakes, Sweet Desserts, Ice Cream, Soda (diet and regular), Sweet Tea, Candies, Chips, Cookies, Store Bought Juices, Alcohol in Excess of  1-2 drinks a day, Lemonade,  Artificial Sweeteners, Doughnuts, Coffee Creamers, "Sugar-free" Products, etc, etc.  This is not a complete list.....    -Consulting with certified diabetes educators is proven to provide you with the most accurate and current information on diet.  Also, you may be  interested in discussing diet options/exchanges , we can schedule a visit with Kathryn [name], RDN, CDE for individualized nutrition education.  -Exercise: If you are able: 30 -60 minutes a day ,4 days a week, or 150 minutes a week.  The longer the better.  Combine stretch, strength, and aerobic activities.  If you were told in the  past that you have high risk for cardiovascular diseases, you may seek evaluation by your heart doctor prior to initiating moderate to intense exercise programs.                                  Additional Care Considerations for Diabetes   -Diabetes  is a chronic disease.  The most important care consideration is regular follow-up with your diabetes care provider with the goal being avoiding or delaying its complications and to take advantage of advances in medications and technology.    -Type 2 diabetes is known to coexist with other important comorbidities such as high blood pressure and high cholesterol.  It is critical to control not only the diabetes but also the high blood pressure and high cholesterol to minimize and delay the risk of complications including coronary artery disease, stroke, amputations, blindness, etc.    - Studies showed that people with diabetes will benefit from a class of medications known as ACE inhibitors and statins.  Unless there are specific reasons not to be on these medications, the standard of care is to consider getting one from these groups of medications at an optimal doses.  These medications are generally considered safe and proven to help protect the heart and the kidneys.    - People with diabetes are encouraged to initiate and maintain regular follow-up with eye doctors, foot   doctors, dentists , and if necessary heart and kidney doctors.     - It is highly recommended that people with diabetes quit smoking or stay away from smoking, and get yearly  flu vaccine and pneumonia vaccine at least every 5 years.  One other important lifestyle recommendation is to ensure adequate sleep - at least 6-7 hours of uninterrupted sleep at night.  -Exercise: If you are able: 30 -60 minutes a day, 4 days a week, or 150 minutes a week.  The longer the better.  Combine stretch, strength, and aerobic activities.  If you were told in the past that you have high risk for  cardiovascular diseases, you may seek evaluation by your heart doctor prior to initiating moderate to intense exercise programs.          

## 2020-08-02 NOTE — Telephone Encounter (Signed)
Great, thanks

## 2020-08-02 NOTE — Telephone Encounter (Signed)
Prior auth was approve/done on 07/04/20. Is schedule now; 08/06/20. This was when I was out of work on [place] w/ dad.

## 2020-08-02 NOTE — Telephone Encounter (Signed)
Patient called and stated that her back and leg are really hurting her and she is asking if something can be called in to help her feel better so she is not in so much pain?  Also patient wants to know when her MRI will be? I see that a referral was put in on 06/28/2020 and patient has not heard anything back about this being done?

## 2020-08-02 NOTE — Telephone Encounter (Signed)
Kathryn [name] order an MRI of the lumbar spine more than a month ago-can you find out why this was not done?  Also, call the patient and let her know that she needs to make an appointment with Sarah to address this issue as she had dealt with this previously.  If Kathryn [name] has an opening today that would be great or perhaps she can do a telemedicine visit.

## 2020-08-06 ENCOUNTER — Ambulatory Visit (HOSPITAL_COMMUNITY)
Admission: RE | Admit: 2020-08-06 | Discharge: 2020-08-06 | Disposition: A | Discharge status: Home / Self Care | Payer: BC Managed Care – PPO | Source: Ambulatory Visit | Attending: Internal Medicine | Admitting: Internal Medicine

## 2020-08-06 ENCOUNTER — Telehealth (INDEPENDENT_AMBULATORY_CARE_PROVIDER_SITE_OTHER): Payer: Self-pay

## 2020-08-06 DIAGNOSIS — Z1231 Encounter for screening mammogram for malignant neoplasm of breast: Secondary | ICD-10-CM

## 2020-08-06 NOTE — Telephone Encounter (Signed)
Patient called and stated that she is returning a call to [facility]. Patient did not know what she was calling for and thought maybe it was for the MRI, I let her know this is scheduled on 08/14/20 and arrive at 2:30pm. Patient verbalized an understanding and stated that she knows where to go for radiology.  Please advise if there is more information to give her. Thank you.

## 2020-08-06 NOTE — Telephone Encounter (Signed)
I think this is the information she was looking for so we are good.

## 2020-08-07 NOTE — Progress Notes (Deleted)
   WELL-WOMAN EXAMINATION Patient name: Kathryn [name] MRN [id number]  Date of birth: [birth year] Chief Complaint:   No chief complaint on file.  History of Present Illness:   Kathryn [name] is a 50 y.o. G3P3003 {Race/ethnicity:17218} female being seen today for a routine well-woman exam.  Today she notes: ***   Patient's last menstrual period was 01/23/2013. Denies issues with her menses The current method of family planning is {contraceptive method:5051}.    Last pap 07/2017, []  PAP Last mammogram: 05/2019 Last colonoscopy:06/2020  Depression screen [facility] 2/9 06/28/2020 10/12/2019 08/15/2019 08/03/2017 03/03/2017  Decreased Interest 2 0 0 0 0  Down, Depressed, Hopeless 2 0 0 0 0  PHQ - 2 Score 4 0 0 0 0  Altered sleeping 2 - - - -  Tired, decreased energy 2 - - - -  Change in appetite 2 - - - -  Feeling bad or failure about yourself  0 - - - -  Trouble concentrating 1 - - - -  Moving slowly or fidgety/restless 0 - - - -  Suicidal thoughts 0 - - - -  PHQ-9 Score 11 - - - -  Difficult doing work/chores Somewhat difficult - - - -  Some recent data might be hidden      Review of Systems:   Pertinent items are noted in HPI Denies any headaches, blurred vision, fatigue, shortness of breath, chest pain, abdominal pain, bowel movements, urination, or intercourse unless otherwise stated above.  Pertinent History Reviewed:  Reviewed past medical,surgical, social and family history.  Reviewed problem list, medications and allergies. Physical Assessment:  There were no vitals filed for this visit.There is no height or weight on file to calculate BMI.        Physical Examination:   General appearance - well appearing, and in no distress  Mental status - alert, oriented to person, place, and time  Psych:  She has a normal mood and affect  Skin - warm and dry, normal color, no suspicious lesions noted  Chest - effort normal, all lung fields clear to auscultation bilaterally  Heart -  normal rate and regular rhythm  Neck:  midline trachea, no thyromegaly or nodules  Breasts - breasts appear normal, no suspicious masses, no skin or nipple changes or  axillary nodes  Abdomen - soft, nontender, nondistended, no masses or organomegaly  Pelvic - VULVA: normal appearing vulva with no masses, tenderness or lesions  VAGINA: normal appearing vagina with normal color and discharge, no lesions  CERVIX: normal appearing cervix without discharge or lesions, no CMT  Thin prep pap is {Desc; done/not:10129} *** HR HPV cotesting  UTERUS: uterus is felt to be normal size, shape, consistency and nontender   ADNEXA: No adnexal masses or tenderness noted.  Extremities:  No swelling or varicosities noted  Chaperone: {Chaperone:19197::"N/A","Latisha Cresenzo","Janet Young","Amanda Andrews","Peggy Dones","Nicole Jones","Angel Neas"}     Assessment & Plan:  1) Well-Woman Exam *** Mam and colonoscopy up to date, reviewed guidelines  2) ***  No orders of the defined types were placed in this encounter.   Meds: No orders of the defined types were placed in this encounter.   Follow-up: No follow-ups on file.   Janyth Pupa, DO Attending [place], [facility] for [employer], [place]

## 2020-08-09 ENCOUNTER — Ambulatory Visit (HOSPITAL_COMMUNITY)
Admission: RE | Admit: 2020-08-09 | Discharge: 2020-08-09 | Disposition: A | Discharge status: Home / Self Care | Payer: BC Managed Care – PPO | Source: Ambulatory Visit | Attending: Cardiology | Admitting: Cardiology

## 2020-08-09 ENCOUNTER — Encounter (HOSPITAL_COMMUNITY): Payer: Self-pay

## 2020-08-09 ENCOUNTER — Encounter (HOSPITAL_COMMUNITY)
Admission: RE | Admit: 2020-08-09 | Discharge: 2020-08-09 | Disposition: A | Discharge status: Home / Self Care | Payer: BC Managed Care – PPO | Source: Ambulatory Visit | Attending: Cardiology | Admitting: Cardiology

## 2020-08-09 DIAGNOSIS — R079 Chest pain, unspecified: Secondary | ICD-10-CM | POA: Insufficient documentation

## 2020-08-09 LAB — NM MYOCAR MULTI W/SPECT W/WALL MOTION / EF
LV dias vol: 56 mL (ref 46–106)
LV sys vol: 19 mL
Peak HR: 96 {beats}/min
RATE: 0.39
Rest HR: 82 {beats}/min
SDS: 2
SRS: 0
SSS: 2
TID: 1.14

## 2020-08-09 MED ORDER — AMINOPHYLLINE 25 MG/ML IV SOLN
INTRAVENOUS | Status: AC
  Filled 2020-08-09: qty 10

## 2020-08-09 MED ORDER — REGADENOSON 0.4 MG/5ML IV SOLN
INTRAVENOUS | Status: AC
  Administered 2020-08-09: 0.4 mg via INTRAVENOUS
  Filled 2020-08-09: qty 5

## 2020-08-09 MED ORDER — TECHNETIUM TC 99M TETROFOSMIN IV KIT
10.0000 | PACK | Freq: Once | INTRAVENOUS | Status: AC | PRN
  Administered 2020-08-09: 10.55 via INTRAVENOUS

## 2020-08-09 MED ORDER — SODIUM CHLORIDE FLUSH 0.9 % IV SOLN
INTRAVENOUS | Status: AC
  Administered 2020-08-09: 10 mL via INTRAVENOUS
  Filled 2020-08-09: qty 10

## 2020-08-09 MED ORDER — TECHNETIUM TC 99M TETROFOSMIN IV KIT
30.0000 | PACK | Freq: Once | INTRAVENOUS | Status: AC | PRN
  Administered 2020-08-09: 29 via INTRAVENOUS

## 2020-08-10 ENCOUNTER — Other Ambulatory Visit: Payer: BC Managed Care – PPO | Admitting: Obstetrics & Gynecology

## 2020-08-10 ENCOUNTER — Telehealth: Payer: Self-pay | Admitting: *Deleted

## 2020-08-10 NOTE — Telephone Encounter (Signed)
-----   Message from Arnoldo Lenis, MD sent at 08/10/2020  8:58 AM EDT ----- Stress test looks good, no signs of any significant blockages in the heart. Needs to f/u with pcp to discuss alternative causes of her symptoms   Zandra Abts MD

## 2020-08-10 NOTE — Telephone Encounter (Signed)
Pt voiced understanding

## 2020-08-13 ENCOUNTER — Other Ambulatory Visit: Payer: Self-pay

## 2020-08-13 ENCOUNTER — Ambulatory Visit: Payer: Medicare Other | Admitting: "Endocrinology

## 2020-08-14 ENCOUNTER — Ambulatory Visit (HOSPITAL_COMMUNITY)
Admission: RE | Admit: 2020-08-14 | Discharge: 2020-08-14 | Disposition: A | Discharge status: Home / Self Care | Payer: BC Managed Care – PPO | Source: Ambulatory Visit | Attending: Nurse Practitioner | Admitting: Nurse Practitioner

## 2020-08-14 DIAGNOSIS — R159 Full incontinence of feces: Secondary | ICD-10-CM | POA: Insufficient documentation

## 2020-08-14 DIAGNOSIS — M545 Low back pain, unspecified: Secondary | ICD-10-CM | POA: Diagnosis not present

## 2020-08-14 DIAGNOSIS — R292 Abnormal reflex: Secondary | ICD-10-CM | POA: Diagnosis not present

## 2020-08-14 DIAGNOSIS — R152 Fecal urgency: Secondary | ICD-10-CM | POA: Insufficient documentation

## 2020-08-14 DIAGNOSIS — M5442 Lumbago with sciatica, left side: Secondary | ICD-10-CM | POA: Diagnosis not present

## 2020-08-14 DIAGNOSIS — M5441 Lumbago with sciatica, right side: Secondary | ICD-10-CM | POA: Diagnosis not present

## 2020-08-15 ENCOUNTER — Other Ambulatory Visit (INDEPENDENT_AMBULATORY_CARE_PROVIDER_SITE_OTHER): Payer: Self-pay | Admitting: Nurse Practitioner

## 2020-08-15 DIAGNOSIS — G549 Nerve root and plexus disorder, unspecified: Secondary | ICD-10-CM

## 2020-08-15 NOTE — Progress Notes (Signed)
Sent to Dr Christella Noa, Call pt to answer all unknown calls to set new appt.

## 2020-08-15 NOTE — Progress Notes (Signed)
Will send to now over  with chart.

## 2020-08-15 NOTE — Progress Notes (Signed)
Thank you :)

## 2020-08-15 NOTE — Progress Notes (Signed)
Please work on this urgent referral to neurosurgery.  MRI shows possible compression of nerve root which is most likely contributing to the patient's pain and neurologic symptoms.  I will have [place] call the patient to let her know the MRI results but if you could work on the referral that would be great.  Thank you.

## 2020-08-27 ENCOUNTER — Ambulatory Visit: Payer: BC Managed Care – PPO | Admitting: "Endocrinology

## 2020-08-27 DIAGNOSIS — R030 Elevated blood-pressure reading, without diagnosis of hypertension: Secondary | ICD-10-CM | POA: Insufficient documentation

## 2020-08-27 DIAGNOSIS — Z6829 Body mass index (BMI) 29.0-29.9, adult: Secondary | ICD-10-CM | POA: Insufficient documentation

## 2020-08-27 DIAGNOSIS — M5126 Other intervertebral disc displacement, lumbar region: Secondary | ICD-10-CM | POA: Insufficient documentation

## 2020-09-06 DIAGNOSIS — M5126 Other intervertebral disc displacement, lumbar region: Secondary | ICD-10-CM | POA: Diagnosis not present

## 2020-09-06 HISTORY — PX: BACK SURGERY: SHX140

## 2020-09-09 NOTE — Progress Notes (Deleted)
   WELL-WOMAN EXAMINATION Patient name: Kathryn [name] MRN [id number]  Date of birth: 02/16/1971 Chief Complaint:   No chief complaint on file.  History of Present Illness:   Kathryn [name] is a 50 y.o. [id number] postmenopausal female being seen today for a routine well-woman exam.  Today she notes: ***   Patient's last menstrual period was 01/23/2013. Denies issues with her menses The current method of family planning is BTL  Last pap 2019.  Last mammogram: 07/2018-[facility]. [facility] Last colonoscopy: 06/2020  Depression screen [facility] 2/9 06/28/2020 10/12/2019 08/15/2019 08/03/2017 03/03/2017  Decreased Interest 2 0 0 0 0  Down, Depressed, Hopeless 2 0 0 0 0  PHQ - 2 Score 4 0 0 0 0  Altered sleeping 2 - - - -  Tired, decreased energy 2 - - - -  Change in appetite 2 - - - -  Feeling bad or failure about yourself  0 - - - -  Trouble concentrating 1 - - - -  Moving slowly or fidgety/restless 0 - - - -  Suicidal thoughts 0 - - - -  PHQ-9 Score 11 - - - -  Difficult doing work/chores Somewhat difficult - - - -  Some recent data might be hidden      Review of Systems:   Pertinent items are noted in HPI Denies any headaches, blurred vision, fatigue, shortness of breath, chest pain, abdominal pain, bowel movements, urination, or intercourse unless otherwise stated above.  Pertinent History Reviewed:  Reviewed past medical,surgical, social and family history.  Reviewed problem list, medications and allergies. Physical Assessment:  There were no vitals filed for this visit.There is no height or weight on file to calculate BMI.        Physical Examination:   General appearance - well appearing, and in no distress  Mental status - alert, oriented to person, place, and time  Psych:  She has a normal mood and affect  Skin - warm and dry, normal color, no suspicious lesions noted  Chest - effort normal, all lung fields clear to auscultation bilaterally  Heart - normal rate and regular rhythm  Neck:   midline trachea, no thyromegaly or nodules  Breasts - breasts appear normal, no suspicious masses, no skin or nipple changes or  axillary nodes  Abdomen - soft, nontender, nondistended, no masses or organomegaly  Pelvic - VULVA: normal appearing vulva with no masses, tenderness or lesions  VAGINA: normal appearing vagina with normal color and discharge, no lesions  CERVIX: normal appearing cervix without discharge or lesions, no CMT  Thin prep pap is {Desc; done/not:10129} *** HR HPV cotesting  UTERUS: uterus is felt to be normal size, shape, consistency and nontender   ADNEXA: No adnexal masses or tenderness noted.  Rectal - normal rectal, good sphincter tone, no masses felt. Hemoccult: ***  Extremities:  No swelling or varicosities noted  Chaperone: {Chaperone:19197::"N/A","Latisha Cresenzo","Janet Young","Amanda Andrews","Peggy Dones","Nicole Jones","Angel Neas"}     Assessment & Plan:  1) Well-Woman Exam ***  2) ***  No orders of the defined types were placed in this encounter.   Meds: No orders of the defined types were placed in this encounter.   Follow-up: No follow-ups on file.   Janyth Pupa, DO Attending [place], [facility] for [employer], [place]

## 2020-09-13 ENCOUNTER — Encounter (INDEPENDENT_AMBULATORY_CARE_PROVIDER_SITE_OTHER): Payer: Self-pay

## 2020-09-14 ENCOUNTER — Other Ambulatory Visit: Payer: BC Managed Care – PPO | Admitting: Obstetrics & Gynecology

## 2020-09-28 ENCOUNTER — Telehealth (INDEPENDENT_AMBULATORY_CARE_PROVIDER_SITE_OTHER): Payer: BC Managed Care – PPO | Admitting: Psychiatry

## 2020-09-28 ENCOUNTER — Encounter (HOSPITAL_COMMUNITY): Payer: Self-pay | Admitting: Psychiatry

## 2020-09-28 DIAGNOSIS — J4540 Moderate persistent asthma, uncomplicated: Secondary | ICD-10-CM | POA: Diagnosis not present

## 2020-09-28 DIAGNOSIS — F251 Schizoaffective disorder, depressive type: Secondary | ICD-10-CM

## 2020-09-28 MED ORDER — BENZTROPINE MESYLATE 1 MG PO TABS: ORAL_TABLET | ORAL | 2 refills | Status: DC

## 2020-09-28 MED ORDER — TRAZODONE HCL 100 MG PO TABS: ORAL_TABLET | ORAL | 2 refills | Status: DC

## 2020-09-28 MED ORDER — DULOXETINE HCL 60 MG PO CPEP: 60.0000 mg | ORAL_CAPSULE | Freq: Two times a day (BID) | ORAL | 2 refills | Status: DC

## 2020-09-28 MED ORDER — RISPERIDONE 2 MG PO TABS: ORAL_TABLET | ORAL | 2 refills | Status: DC

## 2020-09-28 MED ORDER — CLONAZEPAM 0.5 MG PO TABS: 0.5000 mg | ORAL_TABLET | Freq: Two times a day (BID) | ORAL | 2 refills | Status: DC | PRN

## 2020-09-28 NOTE — Progress Notes (Signed)
Virtual Visit via Telephone Note  I connected with Kathryn [name] on 09/28/20 at 11:00 AM EDT by telephone and verified that I am speaking with the correct person using two identifiers.  Location: Patient: home  Provider: home office   I discussed the limitations, risks, security and privacy concerns of performing an evaluation and management service by telephone and the availability of in person appointments. I also discussed with the patient that there may be a patient responsible charge related to this service. The patient expressed understanding and agreed to proceed.      I discussed the assessment and treatment plan with the patient. The patient was provided an opportunity to ask questions and all were answered. The patient agreed with the plan and demonstrated an understanding of the instructions.   The patient was advised to call back or seek an in-person evaluation if the symptoms worsen or if the condition fails to improve as anticipated.  I provided 15 minutes of non-face-to-face time during this encounter.   Levonne Spiller, MD  [facility] [place] MD/PA/NP OP Progress Note  09/28/2020 11:17 AM Kathryn [name]  MRN:  [id number]  Chief Complaint:  Chief Complaint    Anxiety; Depression; Schizophrenia; Follow-up     HPI: This patient is a 50 year old separated black female who lives with her daughter in [place]. 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  She states that she had back surgery last month in the lumbar area.  It sounds as if she had 2 discs removed.  She does not see that she has had much improvement from the surgery at least not yet.  She still having numbness and tingling down her right leg.  She is going back to orthopedics next week.  Nevertheless she states her mood has been good and she denies severe depression anxiety or thoughts of self-harm or suicide.  She denies  auditory or visual hallucinations.  She is sleeping well.  She denies any muscle jerking twitching blinking tics or other abnormal movements Visit Diagnosis:    ICD-10-CM   1. Schizoaffective disorder, depressive type ([place])  F25.1     Past Psychiatric History: 2 previous psychiatric hospitalizations for schizoaffective disorder  Past Medical History:  Past Medical History:  Diagnosis Date  . Anemia   . Anxiety   . Asthma   . Depression   . GERD (gastroesophageal reflux disease)   . HLD (hyperlipidemia) 04/07/2019  . Hypertension   . Hypothyroidism, adult 04/07/2019  . Neuropathy   . Obesity (BMI 30.0-34.9) 04/07/2019  . Schizoaffective disorder ([place])   . Type II diabetes mellitus, uncontrolled ([place]) 04/27/2019    Past Surgical History:  Procedure Laterality Date  . BREAST SURGERY Right    biopsy  . CESAREAN SECTION    . CHOLECYSTECTOMY  06/02/2012   Procedure: LAPAROSCOPIC CHOLECYSTECTOMY;  Surgeon: Jamesetta So, MD;  Location: [facility];  Service: General;  Laterality: N/A;  Attempted laparoscopic cholecystectomy  . CHOLECYSTECTOMY  06/02/2012   Procedure: CHOLECYSTECTOMY;  Surgeon: Jamesetta So, MD;  Location: [facility];  Service: General;  Laterality: N/A;  converted to open at  0905  . COLONOSCOPY WITH PROPOFOL N/A 07/18/2020   Procedure: COLONOSCOPY WITH PROPOFOL;  Surgeon: [name], MD;  Location: [facility];  Service: Gastroenterology;  Laterality: N/A;  . ESOPHAGEAL DILATION  12/31/2018   Procedure: ESOPHAGEAL DILATION;  Surgeon: Rogene Houston, MD;  Location: [facility];  Service: Endoscopy;;  . ESOPHAGOGASTRODUODENOSCOPY (EGD) WITH PROPOFOL N/A 08/24/2015   Procedure: ESOPHAGOGASTRODUODENOSCOPY (EGD) WITH PROPOFOL;  Surgeon: Rogene Houston, MD;  [facility]: [facility];  Service: Endoscopy;  Laterality: N/A;  1:10 - Ann to notify pt to arrive at 11:30  . ESOPHAGOGASTRODUODENOSCOPY (EGD) WITH PROPOFOL N/A 12/31/2018   Procedure: ESOPHAGOGASTRODUODENOSCOPY  (EGD) WITH PROPOFOL;  Surgeon: Rogene Houston, MD;  [facility];  Service: Endoscopy;  Laterality: N/A;  . FLEXIBLE SIGMOIDOSCOPY  07/17/2020   Procedure: FLEXIBLE SIGMOIDOSCOPY;  Surgeon: [name], MD;  Location: [facility];  Service: Gastroenterology;;  . POLYPECTOMY  07/18/2020   Procedure: POLYPECTOMY INTESTINAL;  Surgeon: [name], [name], MD;  Location: [facility];  Service: Gastroenterology;;  ascending colon polyp;   . tooth removal  2019   all teeth removed  . TUBAL LIGATION     X3    Family Psychiatric History: see below  Family History:  Family History  Problem Relation Age of Onset  . Hypertension Mother   . Alcohol abuse Mother   . Heart disease Mother   . Kidney disease Mother   . Hypertension Father   . Alcohol abuse Sister   . Stroke Other   . Diabetes Other   . Cancer Other   . Seizures Other   . Alcohol abuse Maternal Aunt   . Alcohol abuse Paternal Aunt   . Alcohol abuse Maternal Grandfather   . Alcohol abuse Maternal Grandmother   . Alcohol abuse Cousin   . Hearing loss Daughter     Social History:  Social History   Socioeconomic History  . Marital status: Married    Spouse name: Not on file  . Number of children: Not on file  . Years of education: Not on file  . Highest education level: Not on file  Occupational History  . Not on file  Tobacco Use  . Smoking status: Never Smoker  . Smokeless tobacco: Never Used  Vaping Use  . Vaping Use: Never used  Substance and Sexual Activity  . Alcohol use: No    Alcohol/week: 0.0 standard drinks  . Drug use: No  . Sexual activity: Not Currently    Birth control/protection: Surgical  Other Topics Concern  . Not on file  Social History Narrative   Married for 22 years.On disability secondary to schizophrenia.Lives with husband.   Social Determinants of Health   Financial Resource Strain: Not on file  Food Insecurity: Not on file  Transportation Needs:  Not on file  Physical Activity: Not on file  Stress: Not on file  Social Connections: Not on file    Allergies: No Known Allergies  Metabolic Disorder Labs: Lab Results  Component Value Date   HGBA1C 13.2 (H) 07/30/2020   MPG 332 07/30/2020   MPG  07/19/2019     Comment:     eAG cannot be calculated. Hemoglobin A1c result exceeds the linearity of the assay.    No results found for: PROLACTIN Lab Results  Component Value Date   CHOL 107 07/30/2020   TRIG 89 07/30/2020   HDL 53 07/30/2020   CHOLHDL 2.0 07/30/2020   LDLCALC 37 07/30/2020   LDLCALC 42 06/28/2020   Lab Results  Component Value Date   TSH 1.18 06/28/2020   TSH 1.26 02/07/2020    Therapeutic Level Labs: Lab Results  Component Value Date   LITHIUM <0.06 (L) 07/03/2016   LITHIUM 1.17 09/09/2015   No results found  for: VALPROATE No components found for:  CBMZ  Current Medications: Current Outpatient Medications  Medication Sig Dispense Refill  . albuterol (VENTOLIN HFA) 108 (90 Base) MCG/ACT inhaler INHALE 2 PUFFS INTO THE LUNGS EVERY 6 HOURS AS NEEDED FOR WHEEZING OR SHORTNESS OF BREATH (Patient taking differently: Inhale 2 puffs into the lungs every 6 (six) hours as needed for wheezing or shortness of breath.) 6.7 g 1  . aspirin EC 81 MG tablet Take 81 mg by mouth daily.    Marland Kitchen atorvastatin (LIPITOR) 10 MG tablet TAKE 1 TABLET BY MOUTH AT BEDTIME (Patient taking differently: Take 10 mg by mouth daily.) 90 tablet 1  . benztropine (COGENTIN) 1 MG tablet TAKE 1 TABLET(1 MG) BY MOUTH AT BEDTIME 90 tablet 2  . Blood Glucose Monitoring Suppl (ACCU-CHEK GUIDE ME) w/Device KIT 1 Piece by Does not apply route as directed. 1 kit 0  . carvedilol (COREG) 12.5 MG tablet Take 1 tablet (12.5 mg total) by mouth 2 (two) times daily. 180 tablet 1  . Cholecalciferol (VITAMIN D-3) 125 MCG (5000 UT) TABS Take 2 tablets by mouth daily. 30 tablet 1  . clonazePAM (KLONOPIN) 0.5 MG tablet Take 1 tablet (0.5 mg total) by mouth 2  (two) times daily as needed for anxiety. 180 tablet 2  . DULoxetine (CYMBALTA) 60 MG capsule Take 1 capsule (60 mg total) by mouth 2 (two) times daily. 180 capsule 2  . Exenatide ER (BYDUREON BCISE) 2 MG/0.85ML AUIJ Inject 2 mg into the skin every Monday.    Marland Kitchen FLOVENT HFA 44 MCG/ACT inhaler Inhale 2 puffs into the lungs in the morning and at bedtime. 1 each 3  . gabapentin (NEURONTIN) 300 MG capsule Take 1 capsule (300 mg total) by mouth 3 (three) times daily. 90 capsule 3  . glipiZIDE (GLUCOTROL XL) 5 MG 24 hr tablet Take 1 tablet (5 mg total) by mouth daily with breakfast. 90 tablet 1  . glucose blood (ACCU-CHEK GUIDE) test strip Use as instructed 150 each 2  . insulin glargine, 2 Unit Dial, (TOUJEO MAX SOLOSTAR) 300 UNIT/ML Solostar Pen Inject 80 Units into the skin daily. 4 pen 2  . LINZESS 290 MCG CAPS capsule TAKE 1 CAPSULE(290 MCG) BY MOUTH DAILY BEFORE BREAKFAST (Patient taking differently: Take 290 mcg by mouth daily before breakfast.) 90 capsule 3  . losartan (COZAAR) 50 MG tablet TAKE 1 TABLET BY MOUTH EVERY DAY (Patient taking differently: Take 50 mg by mouth daily.) 90 tablet 1  . omeprazole (PRILOSEC) 40 MG capsule Take 1 capsule (40 mg total) by mouth daily. 90 capsule 3  . ondansetron (ZOFRAN) 4 MG tablet Take 1 tablet (4 mg total) by mouth every 6 (six) hours. as needed for nausea or vomiting. (Patient taking differently: Take 4 mg by mouth every 6 (six) hours as needed for nausea or vomiting. as needed for nausea or vomiting.) 10 tablet 2  . pantoprazole (PROTONIX) 40 MG tablet TAKE 1 TABLET BY MOUTH EVERY DAY 90 tablet 1  . promethazine (PHENERGAN) 25 MG tablet Take 25 mg by mouth every 6 (six) hours as needed for nausea or vomiting.     . risperiDONE (RISPERDAL) 2 MG tablet Take one tablet qam three tablets at bedtime 360 tablet 2  . traZODone (DESYREL) 100 MG tablet TAKE 2 TABLETS(200 MG) BY MOUTH AT BEDTIME 180 tablet 2   No current facility-administered medications for this  visit.     Musculoskeletal: Strength & Muscle Tone: within normal limits Gait & Station: normal Patient leans:  N/A  Psychiatric Specialty Exam: Review of Systems  Musculoskeletal: Positive for back pain.  All other systems reviewed and are negative.   Last menstrual period 01/23/2013.There is no height or weight on file to calculate BMI.  General Appearance: NA  Eye Contact:  NA  Speech:  Clear and Coherent  Volume:  Normal  Mood:  Euthymic  Affect:  NA  Thought Process:  Goal Directed  Orientation:  Full (Time, Place, and Person)  Thought Content: WDL   Suicidal Thoughts:  No  Homicidal Thoughts:  No  Memory:  Immediate;   Good Recent;   Good Remote;   NA  Judgement:  Good  Insight:  Fair  Psychomotor Activity:  Normal  Concentration:  Concentration: Good and Attention Span: Good  Recall:  Good  Fund of Knowledge: Good  Language: Good  Akathisia:  No  Handed:  Right  AIMS (if indicated): not done  Assets:  Communication Skills Desire for Improvement Resilience Social Support Talents/Skills  ADL's:  Intact  Cognition: WNL  Sleep:  Good   Screenings: PHQ2-9   Flowsheet Row Video Visit from 09/28/2020 in [place] Office Visit from 06/28/2020 in [place] Visit from 10/12/2019 in [place] from 08/15/2019 in [facility] Office Visit from 08/03/2017 in [facility]  PHQ-2 Total Score 0 4 0 0 0  PHQ-9 Total Score -- 11 -- -- --    Flowsheet Row Video Visit from 09/28/2020 in [facility] Ad[facility] (Discharged) from 07/18/2020 in [place] Admission (Discharged) from 07/17/2020 in [place] No Risk No Risk No Risk       Assessment and Plan: This patient is a 50 year old female with a history of schizoaffective disorder and posttraumatic stress disorder.   She is feeling better with the addition of clonazepam.  She will continue clonazepam 0.5 mg twice daily for anxiety, Risperdal 2 mg in the morning and 6 mg in the evening for schizophrenia, Cogentin 1 mg at bedtime to prevent side effects from Risperdal and Cymbalta 60 mg twice daily for depression and trazodone 200 mg at bedtime for sleep.Marland Kitchen  She will return to see me in 3 months   Levonne Spiller, MD 09/28/2020, 11:17 AM

## 2020-10-01 DIAGNOSIS — Z6830 Body mass index (BMI) 30.0-30.9, adult: Secondary | ICD-10-CM | POA: Insufficient documentation

## 2020-10-01 DIAGNOSIS — I10 Essential (primary) hypertension: Secondary | ICD-10-CM | POA: Insufficient documentation

## 2020-10-03 ENCOUNTER — Telehealth (HOSPITAL_COMMUNITY): Payer: BC Managed Care – PPO | Admitting: Psychiatry

## 2020-10-03 ENCOUNTER — Other Ambulatory Visit: Payer: Self-pay | Admitting: Neurosurgery

## 2020-10-03 ENCOUNTER — Other Ambulatory Visit (HOSPITAL_COMMUNITY): Payer: Self-pay | Admitting: Neurosurgery

## 2020-10-03 DIAGNOSIS — M5126 Other intervertebral disc displacement, lumbar region: Secondary | ICD-10-CM

## 2020-10-13 LAB — HM DIABETES EYE EXAM

## 2020-10-17 ENCOUNTER — Ambulatory Visit (HOSPITAL_COMMUNITY)
Admission: RE | Admit: 2020-10-17 | Discharge: 2020-10-17 | Disposition: A | Discharge status: Home / Self Care | Payer: BC Managed Care – PPO | Source: Ambulatory Visit | Attending: Neurosurgery | Admitting: Neurosurgery

## 2020-10-17 DIAGNOSIS — M5126 Other intervertebral disc displacement, lumbar region: Secondary | ICD-10-CM | POA: Insufficient documentation

## 2020-10-17 DIAGNOSIS — M545 Low back pain, unspecified: Secondary | ICD-10-CM | POA: Diagnosis not present

## 2020-10-17 MED ORDER — GADOBUTROL 1 MMOL/ML IV SOLN
7.0000 mL | Freq: Once | INTRAVENOUS | Status: AC | PRN
  Administered 2020-10-17: 7 mL via INTRAVENOUS

## 2020-10-18 ENCOUNTER — Telehealth: Payer: Self-pay

## 2020-10-18 NOTE — Telephone Encounter (Signed)
Called in a verbal order to switch testing supplies to one touch verio, the preferred brand on pt's insurance.

## 2020-10-18 NOTE — Telephone Encounter (Signed)
[facility] called and needs a return call because the Accu Chek Guide test strips are not covered by insurance. They said they can take a verbal for one touch verio [phone] reference number [id number]

## 2020-10-19 ENCOUNTER — Other Ambulatory Visit: Payer: BC Managed Care – PPO | Admitting: Obstetrics & Gynecology

## 2020-11-01 ENCOUNTER — Ambulatory Visit (INDEPENDENT_AMBULATORY_CARE_PROVIDER_SITE_OTHER): Payer: BC Managed Care – PPO | Admitting: Nurse Practitioner

## 2020-11-01 ENCOUNTER — Encounter (INDEPENDENT_AMBULATORY_CARE_PROVIDER_SITE_OTHER): Payer: Self-pay | Admitting: Nurse Practitioner

## 2020-11-01 ENCOUNTER — Other Ambulatory Visit: Payer: Self-pay

## 2020-11-01 VITALS — BP 118/84 | HR 88 | Temp 97.2°F | Ht 62.0 in | Wt 159.0 lb

## 2020-11-01 DIAGNOSIS — E782 Mixed hyperlipidemia: Secondary | ICD-10-CM

## 2020-11-01 DIAGNOSIS — E039 Hypothyroidism, unspecified: Secondary | ICD-10-CM

## 2020-11-01 DIAGNOSIS — E559 Vitamin D deficiency, unspecified: Secondary | ICD-10-CM

## 2020-11-01 DIAGNOSIS — I10 Essential (primary) hypertension: Secondary | ICD-10-CM | POA: Diagnosis not present

## 2020-11-01 MED ORDER — LEVOTHYROXINE SODIUM 75 MCG PO TABS: 75.0000 ug | ORAL_TABLET | Freq: Every day | ORAL | 1 refills | Status: DC

## 2020-11-01 NOTE — Progress Notes (Signed)
Subjective:  Patient ID: Kathryn [name], female    DOB: [birth year]  Age: 50 y.o. MRN: [id number]  CC:  Chief Complaint  Patient presents with   Hypertension   Hyperlipidemia   Hypothyroidism   Other    Vitamin D deficiency      HPI  This patient arrives today for the above.  She recently had back surgery completed and tells me she follows up with her surgeon on the 16th.  Hypertension: she continues on Coreg and losartan.  She is tolerating medication well.  Hyperlipidemia: She continues on aspirin 81 mg daily and atorvastatin 10 mg daily.  Last LDL was collected about 3 months ago and it was 37.  Hypothyroidism: She is prescribed levothyroxine 75 mcg daily, but tells me she is been out of it for about 1 week.  Last TSH was collected about 4 months ago and it was 1.18.  Vitamin D deficiency: She continues on 10,000 IUs of vitamin D3 daily.  Last serum check was 33 and this was collected about 4 months ago.  Of note, she also has type 2 diabetes.  She is followed by endocrinology.  She tells me she expects to go back to her endocrinologist in the near future and will call them to schedule an appointment.   Past Medical History:  Diagnosis Date   Anemia    Anxiety    Asthma    Depression    GERD (gastroesophageal reflux disease)    HLD (hyperlipidemia) 04/07/2019   Hypertension    Hypothyroidism, adult 04/07/2019   Neuropathy    Obesity (BMI 30.0-34.9) 04/07/2019   Schizoaffective disorder ([place])    Type II diabetes mellitus, uncontrolled ([place]) 04/27/2019      Family History  Problem Relation Age of Onset   Hypertension Mother    Alcohol abuse Mother    Heart disease Mother    Kidney disease Mother    Hypertension Father    Alcohol abuse Sister    Stroke Other    Diabetes Other    Cancer Other    Seizures Other    Alcohol abuse Maternal Aunt    Alcohol abuse Paternal Aunt    Alcohol abuse Maternal Grandfather    Alcohol abuse Maternal Grandmother     Alcohol abuse Cousin    Hearing loss Daughter     Social History   Social History Narrative   Married for 22 years.On disability secondary to schizophrenia.Lives with husband.   Social History   Tobacco Use   Smoking status: Never   Smokeless tobacco: Never  Substance Use Topics   Alcohol use: No    Alcohol/week: 0.0 standard drinks     Current Meds  Medication Sig   albuterol (VENTOLIN HFA) 108 (90 Base) MCG/ACT inhaler INHALE 2 PUFFS INTO THE LUNGS EVERY 6 HOURS AS NEEDED FOR WHEEZING OR SHORTNESS OF BREATH (Patient taking differently: Inhale 2 puffs into the lungs every 6 (six) hours as needed for wheezing or shortness of breath.)   aspirin EC 81 MG tablet Take 81 mg by mouth daily.   atorvastatin (LIPITOR) 10 MG tablet TAKE 1 TABLET BY MOUTH AT BEDTIME (Patient taking differently: Take 10 mg by mouth daily.)   benztropine (COGENTIN) 1 MG tablet TAKE 1 TABLET(1 MG) BY MOUTH AT BEDTIME   Blood Glucose Monitoring Suppl (ACCU-CHEK GUIDE ME) w/Device KIT 1 Piece by Does not apply route as directed.   carvedilol (COREG) 12.5 MG tablet Take 1 tablet (12.5 mg total) by  mouth 2 (two) times daily.   Cholecalciferol (VITAMIN D-3) 125 MCG (5000 UT) TABS Take 2 tablets by mouth daily.   clonazePAM (KLONOPIN) 0.5 MG tablet Take 1 tablet (0.5 mg total) by mouth 2 (two) times daily as needed for anxiety.   dicyclomine (BENTYL) 10 MG capsule Take by mouth.   DULoxetine (CYMBALTA) 60 MG capsule Take 1 capsule (60 mg total) by mouth 2 (two) times daily.   Exenatide ER (BYDUREON BCISE) 2 MG/0.85ML AUIJ Inject 2 mg into the skin every Monday.   FLOVENT HFA 44 MCG/ACT inhaler Inhale 2 puffs into the lungs in the morning and at bedtime.   glipiZIDE (GLUCOTROL XL) 5 MG 24 hr tablet Take 1 tablet (5 mg total) by mouth daily with breakfast.   glucose blood (ACCU-CHEK GUIDE) test strip Use as instructed   HYDROcodone-acetaminophen (NORCO/VICODIN) 5-325 MG tablet Take 1 tablet by mouth every 6 (six)  hours as needed.   insulin glargine, 2 Unit Dial, (TOUJEO MAX SOLOSTAR) 300 UNIT/ML Solostar Pen Inject 80 Units into the skin daily.   levothyroxine (SYNTHROID) 75 MCG tablet Take 1 tablet (75 mcg total) by mouth daily.   LINZESS 290 MCG CAPS capsule TAKE 1 CAPSULE(290 MCG) BY MOUTH DAILY BEFORE BREAKFAST (Patient taking differently: Take 290 mcg by mouth daily before breakfast.)   losartan (COZAAR) 50 MG tablet TAKE 1 TABLET BY MOUTH EVERY DAY (Patient taking differently: Take 50 mg by mouth daily.)   ondansetron (ZOFRAN) 4 MG tablet Take 1 tablet (4 mg total) by mouth every 6 (six) hours. as needed for nausea or vomiting. (Patient taking differently: Take 4 mg by mouth every 6 (six) hours as needed for nausea or vomiting. as needed for nausea or vomiting.)   pantoprazole (PROTONIX) 40 MG tablet TAKE 1 TABLET BY MOUTH EVERY DAY   promethazine (PHENERGAN) 25 MG tablet Take 25 mg by mouth every 6 (six) hours as needed for nausea or vomiting.    promethazine (PHENERGAN) 25 MG tablet Take by mouth.   risperiDONE (RISPERDAL) 2 MG tablet Take one tablet qam three tablets at bedtime   traZODone (DESYREL) 100 MG tablet TAKE 2 TABLETS(200 MG) BY MOUTH AT BEDTIME    ROS:  Review of Systems  Eyes:  Negative for blurred vision.  Respiratory:  Negative for shortness of breath.   Cardiovascular:  Negative for chest pain.  Neurological:  Negative for dizziness, loss of consciousness and headaches.    Objective:   Today's Vitals: BP 118/84   Pulse 88   Temp (!) 97.2 F (36.2 C) (Temporal)   Ht '5\' 2"'  (1.575 m)   Wt 159 lb (72.1 kg)   LMP 01/23/2013   SpO2 99%   BMI 29.08 kg/m  Vitals with BMI 11/01/2020 08/02/2020 07/31/2020  Height '5\' 2"'  '5\' 2"'  '5\' 2"'   Weight 159 lbs 157 lbs 156 lbs 10 oz  BMI 29.07 31.54 00.86  Systolic 761 950 932  Diastolic 84 89 80  Pulse 88 98 88  Some encounter information is confidential and restricted. Go to Review Flowsheets activity to see all data.     Physical  Exam Vitals reviewed.  Constitutional:      General: She is not in acute distress.    Appearance: Normal appearance.  HENT:     Head: Normocephalic and atraumatic.  Neck:     Vascular: No carotid bruit.  Cardiovascular:     Rate and Rhythm: Normal rate and regular rhythm.     Pulses: Normal pulses.  Heart sounds: Normal heart sounds.  Pulmonary:     Effort: Pulmonary effort is normal.     Breath sounds: Normal breath sounds.  Skin:    General: Skin is warm and dry.  Neurological:     General: No focal deficit present.     Mental Status: She is alert and oriented to person, place, and time.  Psychiatric:        Mood and Affect: Mood normal.        Behavior: Behavior normal.        Judgment: Judgment normal.         Assessment and Plan   1. Hypothyroidism, unspecified type   2. Essential hypertension, benign   3. Vitamin D deficiency   4. Mixed hyperlipidemia      Plan: 1.  We will check TSH today we will refill levothyroxine. 2.  She will continue on her antihypertensives as currently prescribed.  We will check BMP today. 3.  We will check serum vitamin D level today, in the meantime she will continue taking her supplement. 4.  She will continue taking her medications for hyperlipidemia.  She was encouraged to make sure she follows up with her endocrinologist and to schedule an appoint with them in the near future.  She tells me she understands.  Tests ordered Orders Placed This Encounter  Procedures   TSH   Vitamin D, 25-hydroxy   BMP with eGFR([facility])      Meds ordered this encounter  Medications   levothyroxine (SYNTHROID) 75 MCG tablet    Sig: Take 1 tablet (75 mcg total) by mouth daily.    Dispense:  90 tablet    Refill:  1    Order Specific Question:   Supervising Provider    Answer:   Doree Albee [[id number]]    Patient to follow-up in 3 months or sooner as needed.  Ailene Ards, NP

## 2020-11-05 LAB — BASIC METABOLIC PANEL WITH GFR

## 2020-11-05 LAB — TSH

## 2020-11-05 LAB — VITAMIN D 25 HYDROXY (VIT D DEFICIENCY, FRACTURES)

## 2020-11-06 ENCOUNTER — Other Ambulatory Visit (INDEPENDENT_AMBULATORY_CARE_PROVIDER_SITE_OTHER): Payer: Self-pay | Admitting: Nurse Practitioner

## 2020-11-06 DIAGNOSIS — E039 Hypothyroidism, unspecified: Secondary | ICD-10-CM

## 2020-11-06 DIAGNOSIS — I10 Essential (primary) hypertension: Secondary | ICD-10-CM

## 2020-11-06 DIAGNOSIS — E559 Vitamin D deficiency, unspecified: Secondary | ICD-10-CM

## 2020-11-06 NOTE — Progress Notes (Signed)
Please call this patient and get her rescheduled for lab draw as her blood work never resulted due to the blood samples not being picked up over the weekend. I have reordered the labs. Thank you.

## 2020-12-13 DIAGNOSIS — M5416 Radiculopathy, lumbar region: Secondary | ICD-10-CM | POA: Insufficient documentation

## 2020-12-20 ENCOUNTER — Other Ambulatory Visit: Payer: BC Managed Care – PPO | Admitting: Obstetrics & Gynecology

## 2020-12-20 ENCOUNTER — Other Ambulatory Visit (INDEPENDENT_AMBULATORY_CARE_PROVIDER_SITE_OTHER): Payer: Self-pay | Admitting: Nurse Practitioner

## 2020-12-20 DIAGNOSIS — I10 Essential (primary) hypertension: Secondary | ICD-10-CM

## 2020-12-20 DIAGNOSIS — E782 Mixed hyperlipidemia: Secondary | ICD-10-CM

## 2020-12-20 DIAGNOSIS — E039 Hypothyroidism, unspecified: Secondary | ICD-10-CM

## 2020-12-20 DIAGNOSIS — E559 Vitamin D deficiency, unspecified: Secondary | ICD-10-CM

## 2020-12-20 DIAGNOSIS — E1165 Type 2 diabetes mellitus with hyperglycemia: Secondary | ICD-10-CM

## 2021-01-01 ENCOUNTER — Other Ambulatory Visit (HOSPITAL_COMMUNITY): Payer: Self-pay | Admitting: Psychiatry

## 2021-01-01 MED ORDER — BENZTROPINE MESYLATE 1 MG PO TABS: ORAL_TABLET | ORAL | 2 refills | Status: DC

## 2021-01-07 ENCOUNTER — Other Ambulatory Visit (INDEPENDENT_AMBULATORY_CARE_PROVIDER_SITE_OTHER): Payer: Self-pay

## 2021-01-07 DIAGNOSIS — J4540 Moderate persistent asthma, uncomplicated: Secondary | ICD-10-CM

## 2021-01-07 DIAGNOSIS — I10 Essential (primary) hypertension: Secondary | ICD-10-CM

## 2021-01-07 DIAGNOSIS — E1165 Type 2 diabetes mellitus with hyperglycemia: Secondary | ICD-10-CM

## 2021-01-07 DIAGNOSIS — K30 Functional dyspepsia: Secondary | ICD-10-CM

## 2021-01-07 DIAGNOSIS — E559 Vitamin D deficiency, unspecified: Secondary | ICD-10-CM

## 2021-01-07 DIAGNOSIS — E039 Hypothyroidism, unspecified: Secondary | ICD-10-CM

## 2021-01-07 DIAGNOSIS — K219 Gastro-esophageal reflux disease without esophagitis: Secondary | ICD-10-CM

## 2021-01-07 DIAGNOSIS — E782 Mixed hyperlipidemia: Secondary | ICD-10-CM

## 2021-01-09 MED ORDER — LEVOTHYROXINE SODIUM 75 MCG PO TABS: 75.0000 ug | ORAL_TABLET | Freq: Every day | ORAL | 0 refills | Status: DC

## 2021-01-09 MED ORDER — ALBUTEROL SULFATE HFA 108 (90 BASE) MCG/ACT IN AERS: 2.0000 | INHALATION_SPRAY | Freq: Four times a day (QID) | RESPIRATORY_TRACT | 2 refills | Status: DC | PRN

## 2021-01-09 MED ORDER — ATORVASTATIN CALCIUM 10 MG PO TABS
10.0000 mg | ORAL_TABLET | Freq: Every day | ORAL | 0 refills | Status: DC
Start: 2021-01-09 — End: 2021-08-29

## 2021-01-09 MED ORDER — CARVEDILOL 12.5 MG PO TABS: 12.5000 mg | ORAL_TABLET | Freq: Two times a day (BID) | ORAL | 0 refills | Status: DC

## 2021-01-09 MED ORDER — LOSARTAN POTASSIUM 50 MG PO TABS: 50.0000 mg | ORAL_TABLET | Freq: Every day | ORAL | 0 refills | Status: DC

## 2021-01-09 MED ORDER — PANTOPRAZOLE SODIUM 40 MG PO TBEC: 40.0000 mg | DELAYED_RELEASE_TABLET | Freq: Every day | ORAL | 0 refills | Status: DC

## 2021-01-09 MED ORDER — GLIPIZIDE ER 5 MG PO TB24: 5.0000 mg | ORAL_TABLET | Freq: Every day | ORAL | 0 refills | Status: DC

## 2021-01-09 MED ORDER — GABAPENTIN 300 MG PO CAPS: 300.0000 mg | ORAL_CAPSULE | Freq: Every day | ORAL | 0 refills | Status: DC

## 2021-01-14 ENCOUNTER — Telehealth (INDEPENDENT_AMBULATORY_CARE_PROVIDER_SITE_OTHER): Payer: Self-pay

## 2021-01-14 DIAGNOSIS — E1165 Type 2 diabetes mellitus with hyperglycemia: Secondary | ICD-10-CM

## 2021-01-14 DIAGNOSIS — E039 Hypothyroidism, unspecified: Secondary | ICD-10-CM

## 2021-01-14 DIAGNOSIS — J4540 Moderate persistent asthma, uncomplicated: Secondary | ICD-10-CM

## 2021-01-14 DIAGNOSIS — I10 Essential (primary) hypertension: Secondary | ICD-10-CM

## 2021-01-14 DIAGNOSIS — K219 Gastro-esophageal reflux disease without esophagitis: Secondary | ICD-10-CM

## 2021-01-14 DIAGNOSIS — E782 Mixed hyperlipidemia: Secondary | ICD-10-CM

## 2021-01-15 NOTE — Telephone Encounter (Signed)
Do you know of any specific meds she is requesting? Looks like I refilled a lot of her meds on 01/09/21.

## 2021-01-22 DIAGNOSIS — R030 Elevated blood-pressure reading, without diagnosis of hypertension: Secondary | ICD-10-CM | POA: Diagnosis not present

## 2021-01-22 DIAGNOSIS — M5416 Radiculopathy, lumbar region: Secondary | ICD-10-CM | POA: Diagnosis not present

## 2021-01-22 DIAGNOSIS — Z6826 Body mass index (BMI) 26.0-26.9, adult: Secondary | ICD-10-CM | POA: Diagnosis not present

## 2021-01-22 DIAGNOSIS — M5126 Other intervertebral disc displacement, lumbar region: Secondary | ICD-10-CM | POA: Diagnosis not present

## 2021-01-31 ENCOUNTER — Ambulatory Visit (INDEPENDENT_AMBULATORY_CARE_PROVIDER_SITE_OTHER): Payer: BC Managed Care – PPO | Admitting: Internal Medicine

## 2021-01-31 ENCOUNTER — Ambulatory Visit: Payer: BC Managed Care – PPO | Admitting: Cardiology

## 2021-01-31 NOTE — Progress Notes (Deleted)
Clinical Summary Ms. Capece is a 50 y.o.female seen for follow up of the following medical problems.    1. Chest pain 01/2017 Echo LVEF 60-65%, no WMAs, small effusion. Diastolic function not described. By reported parameters abnormal indeterminate grade.  03/2017 nuclear stress no ischemia.    - sharp pain LUQ, 10/10. Can occur at rest or with exertion. Worst with movement, breathing.  - lasts few minutes, occurs daily - SOB improved since last visit   Recent left sided chest pain, sharp pain. Can occur at rest or with exertion. Worst with position. Lasts about 5 minutes. Can feel, hot, sweaty. Different from prior pain.  -some recent DOE.    07/2020 nuclear stress: no ischemia     2. HTN - compliant with meds - yesterday pcp visit 110/78. Just took meds this AM     3. Hyperlipidemia - 07/2020 TC 107 HDL 53 TG 89 LDL 37 - she is compliant with statin   4. DM2 - poorly controlled, recent Hgb A1c 13.2 - followed by pcp, is to reestablish with endocrine.    Past Medical History:  Diagnosis Date   Anemia    Anxiety    Asthma    Depression    GERD (gastroesophageal reflux disease)    HLD (hyperlipidemia) 04/07/2019   Hypertension    Hypothyroidism, adult 04/07/2019   Neuropathy    Obesity (BMI 30.0-34.9) 04/07/2019   Schizoaffective disorder ([place])    Type II diabetes mellitus, uncontrolled ([place]) 04/27/2019     No Known Allergies   Current Outpatient Medications  Medication Sig Dispense Refill   albuterol (VENTOLIN HFA) 108 (90 Base) MCG/ACT inhaler Inhale 2 puffs into the lungs every 6 (six) hours as needed for wheezing or shortness of breath. 8 g 2   aspirin EC 81 MG tablet Take 81 mg by mouth daily.     atorvastatin (LIPITOR) 10 MG tablet Take 1 tablet (10 mg total) by mouth at bedtime. 90 tablet 0   benztropine (COGENTIN) 1 MG tablet TAKE 1 TABLET(1 MG) BY MOUTH AT BEDTIME 90 tablet 2   Blood Glucose Monitoring Suppl (ACCU-CHEK GUIDE ME) w/Device KIT 1  Piece by Does not apply route as directed. 1 kit 0   carvedilol (COREG) 12.5 MG tablet Take 1 tablet (12.5 mg total) by mouth 2 (two) times daily. 180 tablet 0   Cholecalciferol (VITAMIN D-3) 125 MCG (5000 UT) TABS Take 2 tablets by mouth daily. 30 tablet 1   clonazePAM (KLONOPIN) 0.5 MG tablet Take 1 tablet (0.5 mg total) by mouth 2 (two) times daily as needed for anxiety. 180 tablet 2   dicyclomine (BENTYL) 10 MG capsule Take by mouth.     DULoxetine (CYMBALTA) 60 MG capsule Take 1 capsule (60 mg total) by mouth 2 (two) times daily. 180 capsule 2   Exenatide ER (BYDUREON BCISE) 2 MG/0.85ML AUIJ Inject 2 mg into the skin every Monday.     FLOVENT HFA 44 MCG/ACT inhaler Inhale 2 puffs into the lungs in the morning and at bedtime. 1 each 3   gabapentin (NEURONTIN) 300 MG capsule Take 1 capsule (300 mg total) by mouth daily. 90 capsule 0   glipiZIDE (GLUCOTROL XL) 5 MG 24 hr tablet Take 1 tablet (5 mg total) by mouth daily with breakfast. 90 tablet 0   glucose blood (ACCU-CHEK GUIDE) test strip Use as instructed 150 each 2   HYDROcodone-acetaminophen (NORCO/VICODIN) 5-325 MG tablet Take 1 tablet by mouth every 6 (six) hours as needed.  insulin glargine, 2 Unit Dial, (TOUJEO MAX SOLOSTAR) 300 UNIT/ML Solostar Pen Inject 80 Units into the skin daily. 4 pen 2   levothyroxine (SYNTHROID) 75 MCG tablet Take 1 tablet (75 mcg total) by mouth daily. 90 tablet 0   LINZESS 290 MCG CAPS capsule TAKE 1 CAPSULE(290 MCG) BY MOUTH DAILY BEFORE BREAKFAST (Patient taking differently: Take 290 mcg by mouth daily before breakfast.) 90 capsule 3   losartan (COZAAR) 50 MG tablet Take 1 tablet (50 mg total) by mouth daily. 90 tablet 0   ondansetron (ZOFRAN) 4 MG tablet Take 1 tablet (4 mg total) by mouth every 6 (six) hours. as needed for nausea or vomiting. (Patient taking differently: Take 4 mg by mouth every 6 (six) hours as needed for nausea or vomiting. as needed for nausea or vomiting.) 10 tablet 2   pantoprazole  (PROTONIX) 40 MG tablet Take by mouth. (Patient not taking: Reported on 11/01/2020)     pantoprazole (PROTONIX) 40 MG tablet Take 1 tablet (40 mg total) by mouth daily. 90 tablet 0   promethazine (PHENERGAN) 25 MG tablet Take 25 mg by mouth every 6 (six) hours as needed for nausea or vomiting.      promethazine (PHENERGAN) 25 MG tablet Take by mouth.     risperiDONE (RISPERDAL) 2 MG tablet Take one tablet qam three tablets at bedtime 360 tablet 2   traZODone (DESYREL) 100 MG tablet TAKE 2 TABLETS(200 MG) BY MOUTH AT BEDTIME 180 tablet 2   No current facility-administered medications for this visit.     Past Surgical History:  Procedure Laterality Date   BACK SURGERY  09/06/2020   BREAST SURGERY Right    biopsy   CESAREAN SECTION     CHOLECYSTECTOMY  06/02/2012   Procedure: LAPAROSCOPIC CHOLECYSTECTOMY;  Surgeon: Jamesetta So, MD;  Location: [facility];  Service: General;  Laterality: N/A;  Attempted laparoscopic cholecystectomy   CHOLECYSTECTOMY  06/02/2012   Procedure: CHOLECYSTECTOMY;  Surgeon: Jamesetta So, MD;  Location: [facility];  Service: General;  Laterality: N/A;  converted to open at  0998   COLONOSCOPY WITH PROPOFOL N/A 07/18/2020   Procedure: COLONOSCOPY WITH PROPOFOL;  Surgeon: [name], MD;  Location: [facility];  Service: Gastroenterology;  Laterality: N/A;   ESOPHAGEAL DILATION  12/31/2018   Procedure: ESOPHAGEAL DILATION;  Surgeon: Rogene Houston, MD;  Location: [facility];  Service: Endoscopy;;   ESOPHAGOGASTRODUODENOSCOPY (EGD) WITH PROPOFOL N/A 08/24/2015   Procedure: ESOPHAGOGASTRODUODENOSCOPY (EGD) WITH PROPOFOL;  Surgeon: Rogene Houston, MD;  [facility]: [facility];  Service: Endoscopy;  Laterality: N/A;  1:10 - Ann to notify pt to arrive at 11:30   ESOPHAGOGASTRODUODENOSCOPY (EGD) WITH PROPOFOL N/A 12/31/2018   Procedure: ESOPHAGOGASTRODUODENOSCOPY (EGD) WITH PROPOFOL;  Surgeon: Rogene Houston, MD;  Location: [facility];  Service:  Endoscopy;  Laterality: N/A;   FLEXIBLE SIGMOIDOSCOPY  07/17/2020   Procedure: FLEXIBLE SIGMOIDOSCOPY;  Surgeon: [name], MD;  Location: [facility];  Service: Gastroenterology;;   POLYPECTOMY  07/18/2020   Procedure: POLYPECTOMY INTESTINAL;  Surgeon: [name], [name], MD;  Location: [facility];  Service: Gastroenterology;;  ascending colon polyp;    tooth removal  2019   all teeth removed   TUBAL LIGATION     X3     No Known Allergies    Family History  Problem Relation Age of Onset   Hypertension Mother    Alcohol abuse Mother    Heart disease Mother    Kidney disease  Mother    Hypertension Father    Alcohol abuse Sister    Stroke Other    Diabetes Other    Cancer Other    Seizures Other    Alcohol abuse Maternal Aunt    Alcohol abuse Paternal Aunt    Alcohol abuse Maternal Grandfather    Alcohol abuse Maternal Grandmother    Alcohol abuse Cousin    Hearing loss Daughter      Social History Ms. Grisanti reports that she has never smoked. She has never used smokeless tobacco. Ms. Luepke reports no history of alcohol use.   Review of Systems CONSTITUTIONAL: No weight loss, fever, chills, weakness or fatigue.  HEENT: Eyes: No visual loss, blurred vision, double vision or yellow sclerae.No hearing loss, sneezing, congestion, runny nose or sore throat.  SKIN: No rash or itching.  CARDIOVASCULAR:  RESPIRATORY: No shortness of breath, cough or sputum.  GASTROINTESTINAL: No anorexia, nausea, vomiting or diarrhea. No abdominal pain or blood.  GENITOURINARY: No burning on urination, no polyuria NEUROLOGICAL: No headache, dizziness, syncope, paralysis, ataxia, numbness or tingling in the extremities. No change in bowel or bladder control.  MUSCULOSKELETAL: No muscle, back pain, joint pain or stiffness.  LYMPHATICS: No enlarged nodes. No history of splenectomy.  PSYCHIATRIC: No history of depression or anxiety.  ENDOCRINOLOGIC: No reports of  sweating, cold or heat intolerance. No polyuria or polydipsia.  Marland Kitchen   Physical Examination There were no vitals filed for this visit. There were no vitals filed for this visit.  Gen: resting comfortably, no acute distress HEENT: no scleral icterus, pupils equal round and reactive, no palptable cervical adenopathy,  CV Resp: Clear to auscultation bilaterally GI: abdomen is soft, non-tender, non-distended, normal bowel sounds, no hepatosplenomegaly MSK: extremities are warm, no edema.  Skin: warm, no rash Neuro:  no focal deficits Psych: appropriate affect   Diagnostic Studies  01/2017 echo Study Conclusions   - Left ventricle: The cavity size was normal. Wall thickness was   increased in a pattern of mild LVH. Systolic function was normal.   The estimated ejection fraction was in the range of 60% to 65%.   Wall motion was normal; there were no regional wall motion   abnormalities. - Right atrium: Central venous pressure (est): 3 mm Hg. - Atrial septum: No defect or patent foramen ovale was identified. - Tricuspid valve: There was trivial regurgitation. - Pulmonary arteries: Systolic pressure could not be accurately   estimated. - Pericardium, extracardiac: A small pericardial effusion was   identified circumferential to the heart.   Impressions:   - Mild LVH with LVEF 60-65%. Indeterminate diastolic function.   Trivial tricuspid regurgitation. Small circumferential   pericardial effusion.       03/2017 Nuclear stress Sinus rhythm with nonspecific T wave abnormalities seen throughout study. The study is normal with normal myocardial perfusion. This is a low risk study. Nuclear stress EF: 62%.  07/2020 nuclear stress No diagnostic ST segment changes to indicate ischemia. Small, mild intensity, inferior apical defect that is fixed, more prominent on rest imaging and consistent with soft tissue attenuation. There is evidence of variable breast attenuation artifact. Summed  stress score is low at 2. This is a low risk study. Nuclear stress EF: 66%.    Assessment and Plan  1. Chest pain - long history of chest pain, negative stress test in 2018 - recent chest pain symptoms different from prior, some assocaited SOB - mulitple CAD risk factors including very poorly controlled DM2 with HgbA1c 13 -  will repeat lexiscan for further evaluatin - EKG today shows SR, no acute ischemic changes   2. HTN - mildly elevated but just took meds, well controlled at recent pcp visit - continue current meds   3. Hyperlipidemia -at goal, continue statin      Arnoldo Lenis, M.D.

## 2021-02-05 DIAGNOSIS — M5416 Radiculopathy, lumbar region: Secondary | ICD-10-CM | POA: Diagnosis not present

## 2021-02-06 ENCOUNTER — Other Ambulatory Visit: Payer: BC Managed Care – PPO | Admitting: Obstetrics & Gynecology

## 2021-02-23 ENCOUNTER — Other Ambulatory Visit (INDEPENDENT_AMBULATORY_CARE_PROVIDER_SITE_OTHER): Payer: Self-pay | Admitting: Nurse Practitioner

## 2021-02-23 DIAGNOSIS — E782 Mixed hyperlipidemia: Secondary | ICD-10-CM

## 2021-02-23 DIAGNOSIS — I10 Essential (primary) hypertension: Secondary | ICD-10-CM

## 2021-02-23 DIAGNOSIS — E039 Hypothyroidism, unspecified: Secondary | ICD-10-CM

## 2021-02-23 DIAGNOSIS — E1165 Type 2 diabetes mellitus with hyperglycemia: Secondary | ICD-10-CM

## 2021-02-28 ENCOUNTER — Encounter (INDEPENDENT_AMBULATORY_CARE_PROVIDER_SITE_OTHER): Payer: Self-pay | Admitting: Gastroenterology

## 2021-02-28 ENCOUNTER — Ambulatory Visit (INDEPENDENT_AMBULATORY_CARE_PROVIDER_SITE_OTHER): Payer: BC Managed Care – PPO | Admitting: Gastroenterology

## 2021-03-04 DIAGNOSIS — R030 Elevated blood-pressure reading, without diagnosis of hypertension: Secondary | ICD-10-CM | POA: Diagnosis not present

## 2021-03-04 DIAGNOSIS — Z6828 Body mass index (BMI) 28.0-28.9, adult: Secondary | ICD-10-CM | POA: Insufficient documentation

## 2021-03-04 DIAGNOSIS — M5416 Radiculopathy, lumbar region: Secondary | ICD-10-CM | POA: Diagnosis not present

## 2021-03-19 ENCOUNTER — Other Ambulatory Visit (INDEPENDENT_AMBULATORY_CARE_PROVIDER_SITE_OTHER): Payer: Self-pay | Admitting: Nurse Practitioner

## 2021-03-19 DIAGNOSIS — E1165 Type 2 diabetes mellitus with hyperglycemia: Secondary | ICD-10-CM

## 2021-03-19 DIAGNOSIS — E782 Mixed hyperlipidemia: Secondary | ICD-10-CM

## 2021-03-19 DIAGNOSIS — E039 Hypothyroidism, unspecified: Secondary | ICD-10-CM

## 2021-03-19 DIAGNOSIS — K219 Gastro-esophageal reflux disease without esophagitis: Secondary | ICD-10-CM

## 2021-03-19 DIAGNOSIS — I10 Essential (primary) hypertension: Secondary | ICD-10-CM

## 2021-03-19 DIAGNOSIS — J4540 Moderate persistent asthma, uncomplicated: Secondary | ICD-10-CM

## 2021-03-24 ENCOUNTER — Other Ambulatory Visit (INDEPENDENT_AMBULATORY_CARE_PROVIDER_SITE_OTHER): Payer: Self-pay | Admitting: Nurse Practitioner

## 2021-03-24 ENCOUNTER — Other Ambulatory Visit (HOSPITAL_COMMUNITY): Payer: Self-pay | Admitting: Psychiatry

## 2021-03-24 DIAGNOSIS — E039 Hypothyroidism, unspecified: Secondary | ICD-10-CM

## 2021-03-24 DIAGNOSIS — E782 Mixed hyperlipidemia: Secondary | ICD-10-CM

## 2021-03-24 DIAGNOSIS — E1165 Type 2 diabetes mellitus with hyperglycemia: Secondary | ICD-10-CM

## 2021-03-24 DIAGNOSIS — I10 Essential (primary) hypertension: Secondary | ICD-10-CM

## 2021-03-28 DIAGNOSIS — G629 Polyneuropathy, unspecified: Secondary | ICD-10-CM | POA: Diagnosis not present

## 2021-04-09 DIAGNOSIS — G629 Polyneuropathy, unspecified: Secondary | ICD-10-CM | POA: Diagnosis not present

## 2021-04-23 ENCOUNTER — Ambulatory Visit (INDEPENDENT_AMBULATORY_CARE_PROVIDER_SITE_OTHER): Payer: BC Managed Care – PPO | Admitting: Gastroenterology

## 2021-04-23 ENCOUNTER — Encounter (INDEPENDENT_AMBULATORY_CARE_PROVIDER_SITE_OTHER): Payer: Self-pay | Admitting: Gastroenterology

## 2021-04-23 ENCOUNTER — Other Ambulatory Visit: Payer: Self-pay

## 2021-04-23 VITALS — BP 118/86 | HR 108 | Temp 98.5°F | Ht 62.0 in | Wt 152.5 lb

## 2021-04-23 DIAGNOSIS — K5900 Constipation, unspecified: Secondary | ICD-10-CM | POA: Diagnosis not present

## 2021-04-23 DIAGNOSIS — K30 Functional dyspepsia: Secondary | ICD-10-CM | POA: Diagnosis not present

## 2021-04-23 DIAGNOSIS — K921 Melena: Secondary | ICD-10-CM | POA: Diagnosis not present

## 2021-04-23 DIAGNOSIS — R1010 Upper abdominal pain, unspecified: Secondary | ICD-10-CM | POA: Diagnosis not present

## 2021-04-23 MED ORDER — LINACLOTIDE 290 MCG PO CAPS: 290.0000 ug | ORAL_CAPSULE | Freq: Every day | ORAL | 3 refills | Status: DC

## 2021-04-23 MED ORDER — PANTOPRAZOLE SODIUM 40 MG PO TBEC: 40.0000 mg | DELAYED_RELEASE_TABLET | Freq: Every day | ORAL | 3 refills | Status: DC

## 2021-04-23 NOTE — Patient Instructions (Signed)
I have sent a refill for you protonix and your linzess, please continue taking these daily. Please also add miralax 1 capful daily with your linzess to help with constipation. We will check some labs today and get you scheduled for EGD for further evaluation of your black stools, trouble swallowing and upper abdominal pain.

## 2021-04-23 NOTE — H&P (View-Only) (Signed)
Referring Provider: Ailene Ards, NP Primary Care Physician:  Ailene Ards, NP Primary GI Physician: Dr. Jenetta Downer  Chief Complaint  Patient presents with   Abdominal Pain    Complaining of abdominal pain that moves all around her stomach, patient states the pain has been on going for about 2 months now and has gotten worse over the last two weeks , states that she is somewhat constipated. Still taking the Linzess it makes her go a small amount. Denies any nausea or vomiting.    HPI:   Kathryn [name] is a 50 y.o. female with past medical history of anxiety, depression, asthma, GERD, HLD, HTN, Hypothyroidism, schizoaffective disorder, IBS-C, DM.  Patient presenting today for follow up of abdominal/epigastric pain, dysphagia and melena.   Patient last seen in clinic 06/07/20, she was feeling better since starting her pantoprazole 70m daily and taking it compliantly. She also reported that constipation was much better since starting Linzess 2923m with reported BM every other day.   Today, patient reports that she is having recurrent epigastric pain, worse over the past 2 weeks. She did run out of her pantoprazole about 2 weeks ago, She denies any blood in stools but does report some black stools over the past 2 weeks as well, she denies any new medications, no Iron supplements or pepto bismol. She does endorse some early satiety and difficulty finishing her meals recently. She has lost about 5 pounds over the past 2 weeks. Abdominal pain is worse postprandial. She denies NSAIDs or etoh. She does report that she has some dysphagia anytime she eats meats, has had no episodes of food impaction, can usually get food to pass with drinking liquids.she denies nausea or vomiting. No acid reflux or regurgitation.   She is still taking linzess 29026m She reports that previously she was having a BM about 30 minutes after taking the linzess, daily, now she is only having a BM once or twice a week. She  denies straining. She does report that she eats a diet high in greens. She is drinking a lot of water. Is taking her linzess compliantly once a day. She denies any diarrhea.   Last Colonoscopy:07/18/20 - Preparation of the colon was fair. - One 4 mm polyp in the ascending colon, (inflammatory polyp) - Stool in the descending colon, in the transverse colon and in the ascending colon. - The distal rectum and anal verge are normal on retroflexion view. Last Endoscopy:12/31/18 normal hypopharynx and laryngeal inlet, normal esophagus, z line regular, no abnormality to explain dysphagia, esophagus dilated, normal stomach and duodenal bulb/second portion of duodenum  Recommendations:  Repeat colonoscopy in feb 2025  Past Medical History:  Diagnosis Date   Anemia    Anxiety    Asthma    Depression    GERD (gastroesophageal reflux disease)    HLD (hyperlipidemia) 04/07/2019   Hypertension    Hypothyroidism, adult 04/07/2019   Neuropathy    Obesity (BMI 30.0-34.9) 04/07/2019   Schiz[place]ive disorder ([place]  Type II diabetes mellitus, uncontrolled 04/27/2019    Past Surgical History:  Procedure Laterality Date   BACK SURGERY  09/06/2020   BREAST SURGERY Right    biopsy   CESAREAN SECTION     CHOLECYSTECTOMY  06/02/2012   Procedure: LAPAROSCOPIC CHOLECYSTECTOMY;  Surgeon: MarJamesetta SoD;  Location: [facility];  Service: General;  Laterality: N/A;  Attempted laparoscopic cholecystectomy   CHOLECYSTECTOMY  06/02/2012   Procedure: CHOLECYSTECTOMY;  Surgeon: MarJamesetta SoD;  Location: [facility];  Service: General;  Laterality: N/A;  converted to open at  0905   COLONOSCOPY WITH PROPOFOL N/A 07/18/2020   Procedure: COLONOSCOPY WITH PROPOFOL;  Surgeon: [name], MD;  Location: [facility];  Service: Gastroenterology;  Laterality: N/A;   ESOPHAGEAL DILATION  12/31/2018   Procedure: ESOPHAGEAL DILATION;  Surgeon: Rogene Houston, MD;  Location: [facility];  Service:  Endoscopy;;   ESOPHAGOGASTRODUODENOSCOPY (EGD) WITH PROPOFOL N/A 08/24/2015   Procedure: ESOPHAGOGASTRODUODENOSCOPY (EGD) WITH PROPOFOL;  Surgeon: Rogene Houston, MD;  Location: [facility];  Service: Endoscopy;  Laterality: N/A;  1:10 - Ann to notify pt to arrive at 11:30   ESOPHAGOGASTRODUODENOSCOPY (EGD) WITH PROPOFOL N/A 12/31/2018   Procedure: ESOPHAGOGASTRODUODENOSCOPY (EGD) WITH PROPOFOL;  Surgeon: Rogene Houston, MD;  Location: [facility];  Service: Endoscopy;  Laterality: N/A;   FLEXIBLE SIGMOIDOSCOPY  07/17/2020   Procedure: FLEXIBLE SIGMOIDOSCOPY;  Surgeon: [name], [name], MD;  Location: [facility];  Service: Gastroenterology;;   POLYPECTOMY  07/18/2020   Procedure: POLYPECTOMY INTESTINAL;  Surgeon: [name], [name], MD;  Location: [facility];  Service: Gastroenterology;;  ascending colon polyp;    tooth removal  2019   all teeth removed   TUBAL LIGATION     X3    Current Outpatient Medications  Medication Sig Dispense Refill   albuterol (VENTOLIN HFA) 108 (90 Base) MCG/ACT inhaler Inhale 2 puffs into the lungs every 6 (six) hours as needed for wheezing or shortness of breath. 8 g 2   aspirin EC 81 MG tablet Take 81 mg by mouth daily.     atorvastatin (LIPITOR) 10 MG tablet Take 1 tablet (10 mg total) by mouth at bedtime. 90 tablet 0   benztropine (COGENTIN) 1 MG tablet TAKE 1 TABLET(1 MG) BY MOUTH AT BEDTIME 90 tablet 2   Blood Glucose Monitoring Suppl (ACCU-CHEK GUIDE ME) w/Device KIT 1 Piece by Does not apply route as directed. 1 kit 0   carvedilol (COREG) 12.5 MG tablet Take 1 tablet (12.5 mg total) by mouth 2 (two) times daily. 180 tablet 0   Cholecalciferol (VITAMIN D-3) 125 MCG (5000 UT) TABS Take 2 tablets by mouth daily. 30 tablet 1   clonazePAM (KLONOPIN) 0.5 MG tablet Take 1 tablet (0.5 mg total) by mouth 2 (two) times daily as needed for anxiety. 180 tablet 2   dicyclomine (BENTYL) 10 MG capsule Take by mouth.     DULoxetine  (CYMBALTA) 60 MG capsule Take 1 capsule (60 mg total) by mouth 2 (two) times daily. 180 capsule 2   Exenatide ER (BYDUREON BCISE) 2 MG/0.85ML AUIJ Inject 2 mg into the skin every Monday.     FLOVENT HFA 44 MCG/ACT inhaler Inhale 2 puffs into the lungs in the morning and at bedtime. 1 each 3   gabapentin (NEURONTIN) 300 MG capsule Take 1 capsule (300 mg total) by mouth daily. 90 capsule 0   glipiZIDE (GLUCOTROL XL) 5 MG 24 hr tablet Take 1 tablet (5 mg total) by mouth daily with breakfast. 90 tablet 0   glucose blood (ACCU-CHEK GUIDE) test strip Use as instructed 150 each 2   insulin glargine, 2 Unit Dial, (TOUJEO MAX SOLOSTAR) 300 UNIT/ML Solostar Pen Inject 80 Units into the skin daily. 4 pen 2   levothyroxine (SYNTHROID) 75 MCG tablet Take 1 tablet (75 mcg total) by mouth daily. 90 tablet 0   LINZESS 290 MCG CAPS capsule TAKE 1 CAPSULE(290 MCG) BY MOUTH DAILY BEFORE BREAKFAST (Patient taking  differently: Take 290 mcg by mouth daily before breakfast.) 90 capsule 3   losartan (COZAAR) 50 MG tablet Take 1 tablet (50 mg total) by mouth daily. 90 tablet 0   ondansetron (ZOFRAN) 4 MG tablet Take 1 tablet (4 mg total) by mouth every 6 (six) hours. as needed for nausea or vomiting. (Patient taking differently: Take 4 mg by mouth every 6 (six) hours as needed for nausea or vomiting. as needed for nausea or vomiting.) 10 tablet 2   pantoprazole (PROTONIX) 40 MG tablet Take by mouth.     promethazine (PHENERGAN) 25 MG tablet Take 25 mg by mouth every 6 (six) hours as needed for nausea or vomiting.      risperiDONE (RISPERDAL) 2 MG tablet Take one tablet qam three tablets at bedtime 360 tablet 2   traZODone (DESYREL) 100 MG tablet TAKE 2 TABLETS(200 MG) BY MOUTH AT BEDTIME 180 tablet 2   HYDROcodone-acetaminophen (NORCO/VICODIN) 5-325 MG tablet Take 1 tablet by mouth every 6 (six) hours as needed. (Patient not taking: Reported on 04/23/2021)     pantoprazole (PROTONIX) 40 MG tablet Take 1 tablet (40 mg total)  by mouth daily. (Patient not taking: Reported on 04/23/2021) 90 tablet 0   promethazine (PHENERGAN) 25 MG tablet Take by mouth. (Patient not taking: Reported on 04/23/2021)     No current facility-administered medications for this visit.    Allergies as of 04/23/2021   (No Known Allergies)    Family History  Problem Relation Age of Onset   Hypertension Mother    Alcohol abuse Mother    Heart disease Mother    Kidney disease Mother    Hypertension Father    Alcohol abuse Sister    Stroke Other    Diabetes Other    Cancer Other    Seizures Other    Alcohol abuse Maternal Aunt    Alcohol abuse Paternal Aunt    Alcohol abuse Maternal Grandfather    Alcohol abuse Maternal Grandmother    Alcohol abuse Cousin    Hearing loss Daughter     Social History   Socioeconomic History   Marital status: Married    Spouse name: Not on file   Number of children: Not on file   Years of education: Not on file   Highest education level: Not on file  Occupational History   Not on file  Tobacco Use   Smoking status: Never   Smokeless tobacco: Never  Vaping Use   Vaping Use: Never used  Substance and Sexual Activity   Alcohol use: No    Alcohol/week: 0.0 standard drinks   Drug use: No   Sexual activity: Not Currently    Birth control/protection: Surgical  Other Topics Concern   Not on file  Social History Narrative   Married for 22 years.On disability secondary to schizophrenia.Lives with husband.   Social Determinants of Health   Financial Resource Strain: Not on file  Food Insecurity: Not on file  Transportation Needs: Not on file  Physical Activity: Not on file  Stress: Not on file  Social Connections: Not on file    Review of systems General: negative for malaise, night sweats, fever, chills, +weight loss Neck: Negative for lumps, goiter, pain and significant neck swelling Resp: Negative for cough, wheezing, dyspnea at rest CV: Negative for chest pain, leg swelling,  palpitations, orthopnea GI: denies hematochezia, nausea, vomiting, diarrhea, odyonophagia, +dysphagia, +constipation, +melena, +epigastric pain +early satiety +weight loss MSK: Negative for joint pain or swelling, back pain, and  muscle pain. Derm: Negative for itching or rash Psych: Denies depression, anxiety, memory loss, confusion. No homicidal or suicidal ideation.  Heme: Negative for prolonged bleeding, bruising easily, and swollen nodes. Endocrine: Negative for cold or heat intolerance, polyuria, polydipsia and goiter. Neuro: negative for tremor, gait imbalance, syncope and seizures. The remainder of the review of systems is noncontributory.  Physical Exam: BP 118/86 (BP Location: Left Arm, Patient Position: Sitting, Cuff Size: Normal)   Pulse (!) 108   Temp 98.5 F (36.9 C) (Oral)   Ht _0  (1.575 m)   Wt 152 lb 8 oz (69.2 kg)   LMP 01/23/2013   BMI 27.89 kg/m  General:   Alert and oriented. No distress noted. Pleasant and cooperative.  Head:  Normocephalic and atraumatic. Eyes:  Conjuctiva clear without scleral icterus. Mouth:  Oral mucosa pink and moist. Good dentition. No lesions. Heart: Normal rate and rhythm, s1 and s2 heart sounds present.  Lungs: Clear lung sounds in all lobes. Respirations equal and unlabored. Abdomen:  +BS, soft, non-distended. No rebound or guarding. No HSM or masses noted. Mild TTP of epigastric region Derm: No palmar erythema or jaundice Msk:  Symmetrical without gross deformities. Normal posture. Extremities:  Without edema. Neurologic:  Alert and  oriented x4 Psych:  Alert and cooperative. Normal mood and affect.  Invalid input(s): 6 MONTHS   ASSESSMENT: Kathryn [name] is a 50 y.o. female presenting today for worsening epigastric pain, melena and dysphagia.   Upper abdominal pain was previously controlled on Pantoprazole 59m one daily, she reports that she ran out of this a couple weeks ago, epigastric pain is worse now with early satiety  and some weight loss, approx 5 lbs over the past few weeks. She is also endorsing melena, not in the setting of PO iron, pepto bismol or any other new medications. She is also having dysphagia anytime she eats meat without any episodes of food impaction. She should restart PPI. We will schedule EGD for further evaluation of dysphagia and melena. Dysphagia could be related to stenosis/stricture of esophagus or esophagitis and given presentation with some epigastric tenderness, PUD/esophagitis cannot be ruled out as cause of melena.   Constipation previously well controlled on linzess 2976m daily with one BM once a day, however, over the past few weeks she has only had a BM 1-2x/week without any reported straining. She should continue linzess 29021mdaily and add miralax 1 capful a day. She will let me know if constipation does not improve with this. Continue diet high in fruits/veggies and water.     PLAN:  Restart protonix 16m109mily, refill sent 2. Continue Linzess 290mc27mily, refill sent 3.  add Miralax 1 capful daily 4.  Proceed with EGD for further evaluation of melena and dysphagia 5. Check CBC today    Follow Up: TBD after EGD  Jasmine Mcbeth L. CarlaAlver Sorrow, APRN, AGNP-C Adult-Gerontology Nurse Practitioner [facility]

## 2021-04-23 NOTE — Progress Notes (Signed)
Referring Provider: Ailene Ards, NP Primary Care Physician:  Ailene Ards, NP Primary GI Physician: Dr. Jenetta Downer  Chief Complaint  Patient presents with   Abdominal Pain    Complaining of abdominal pain that moves all around her stomach, patient states the pain has been on going for about 2 months now and has gotten worse over the last two weeks , states that she is somewhat constipated. Still taking the Linzess it makes her go a small amount. Denies any nausea or vomiting.    HPI:   Kathryn [name] is a 50 y.o. female with past medical history of anxiety, depression, asthma, GERD, HLD, HTN, Hypothyroidism, schizoaffective disorder, IBS-C, DM.  Patient presenting today for follow up of abdominal/epigastric pain, dysphagia and melena.   Patient last seen in clinic 06/07/20, she was feeling better since starting her pantoprazole 70m daily and taking it compliantly. She also reported that constipation was much better since starting Linzess 2923m with reported BM every other day.   Today, patient reports that she is having recurrent epigastric pain, worse over the past 2 weeks. She did run out of her pantoprazole about 2 weeks ago, She denies any blood in stools but does report some black stools over the past 2 weeks as well, she denies any new medications, no Iron supplements or pepto bismol. She does endorse some early satiety and difficulty finishing her meals recently. She has lost about 5 pounds over the past 2 weeks. Abdominal pain is worse postprandial. She denies NSAIDs or etoh. She does report that she has some dysphagia anytime she eats meats, has had no episodes of food impaction, can usually get food to pass with drinking liquids.she denies nausea or vomiting. No acid reflux or regurgitation.   She is still taking linzess 29026m She reports that previously she was having a BM about 30 minutes after taking the linzess, daily, now she is only having a BM once or twice a week. She  denies straining. She does report that she eats a diet high in greens. She is drinking a lot of water. Is taking her linzess compliantly once a day. She denies any diarrhea.   Last Colonoscopy:07/18/20 - Preparation of the colon was fair. - One 4 mm polyp in the ascending colon, (inflammatory polyp) - Stool in the descending colon, in the transverse colon and in the ascending colon. - The distal rectum and anal verge are normal on retroflexion view. Last Endoscopy:12/31/18 normal hypopharynx and laryngeal inlet, normal esophagus, z line regular, no abnormality to explain dysphagia, esophagus dilated, normal stomach and duodenal bulb/second portion of duodenum  Recommendations:  Repeat colonoscopy in feb 2025  Past Medical History:  Diagnosis Date   Anemia    Anxiety    Asthma    Depression    GERD (gastroesophageal reflux disease)    HLD (hyperlipidemia) 04/07/2019   Hypertension    Hypothyroidism, adult 04/07/2019   Neuropathy    Obesity (BMI 30.0-34.9) 04/07/2019   Schiz[place]ffective disorder ([place]  Type II diabetes mellitus, uncontrolled 04/27/2019    Past Surgical History:  Procedure Laterality Date   BACK SURGERY  09/06/2020   BREAST SURGERY Right    biopsy   CESAREAN SECTION     CHOLECYSTECTOMY  06/02/2012   Procedure: LAPAROSCOPIC CHOLECYSTECTOMY;  Surgeon: MarJamesetta SoD;  Location: [facility];  Service: General;  Laterality: N/A;  Attempted laparoscopic cholecystectomy   CHOLECYSTECTOMY  06/02/2012   Procedure: CHOLECYSTECTOMY;  Surgeon: MarJamesetta SoD;  Location: [facility];  Service: General;  Laterality: N/A;  converted to open at  0905   COLONOSCOPY WITH PROPOFOL N/A 07/18/2020   Procedure: COLONOSCOPY WITH PROPOFOL;  Surgeon: [name], MD;  Location: [facility];  Service: Gastroenterology;  Laterality: N/A;   ESOPHAGEAL DILATION  12/31/2018   Procedure: ESOPHAGEAL DILATION;  Surgeon: Rogene Houston, MD;  Location: [facility];  Service:  Endoscopy;;   ESOPHAGOGASTRODUODENOSCOPY (EGD) WITH PROPOFOL N/A 08/24/2015   Procedure: ESOPHAGOGASTRODUODENOSCOPY (EGD) WITH PROPOFOL;  Surgeon: Rogene Houston, MD;  Location: [facility];  Service: Endoscopy;  Laterality: N/A;  1:10 - Ann to notify pt to arrive at 11:30   ESOPHAGOGASTRODUODENOSCOPY (EGD) WITH PROPOFOL N/A 12/31/2018   Procedure: ESOPHAGOGASTRODUODENOSCOPY (EGD) WITH PROPOFOL;  Surgeon: Rogene Houston, MD;  Location: [facility];  Service: Endoscopy;  Laterality: N/A;   FLEXIBLE SIGMOIDOSCOPY  07/17/2020   Procedure: FLEXIBLE SIGMOIDOSCOPY;  Surgeon: [name], [name], MD;  Location: [facility];  Service: Gastroenterology;;   POLYPECTOMY  07/18/2020   Procedure: POLYPECTOMY INTESTINAL;  Surgeon: [name], [name], MD;  Location: [facility];  Service: Gastroenterology;;  ascending colon polyp;    tooth removal  2019   all teeth removed   TUBAL LIGATION     X3    Current Outpatient Medications  Medication Sig Dispense Refill   albuterol (VENTOLIN HFA) 108 (90 Base) MCG/ACT inhaler Inhale 2 puffs into the lungs every 6 (six) hours as needed for wheezing or shortness of breath. 8 g 2   aspirin EC 81 MG tablet Take 81 mg by mouth daily.     atorvastatin (LIPITOR) 10 MG tablet Take 1 tablet (10 mg total) by mouth at bedtime. 90 tablet 0   benztropine (COGENTIN) 1 MG tablet TAKE 1 TABLET(1 MG) BY MOUTH AT BEDTIME 90 tablet 2   Blood Glucose Monitoring Suppl (ACCU-CHEK GUIDE ME) w/Device KIT 1 Piece by Does not apply route as directed. 1 kit 0   carvedilol (COREG) 12.5 MG tablet Take 1 tablet (12.5 mg total) by mouth 2 (two) times daily. 180 tablet 0   Cholecalciferol (VITAMIN D-3) 125 MCG (5000 UT) TABS Take 2 tablets by mouth daily. 30 tablet 1   clonazePAM (KLONOPIN) 0.5 MG tablet Take 1 tablet (0.5 mg total) by mouth 2 (two) times daily as needed for anxiety. 180 tablet 2   dicyclomine (BENTYL) 10 MG capsule Take by mouth.     DULoxetine  (CYMBALTA) 60 MG capsule Take 1 capsule (60 mg total) by mouth 2 (two) times daily. 180 capsule 2   Exenatide ER (BYDUREON BCISE) 2 MG/0.85ML AUIJ Inject 2 mg into the skin every Monday.     FLOVENT HFA 44 MCG/ACT inhaler Inhale 2 puffs into the lungs in the morning and at bedtime. 1 each 3   gabapentin (NEURONTIN) 300 MG capsule Take 1 capsule (300 mg total) by mouth daily. 90 capsule 0   glipiZIDE (GLUCOTROL XL) 5 MG 24 hr tablet Take 1 tablet (5 mg total) by mouth daily with breakfast. 90 tablet 0   glucose blood (ACCU-CHEK GUIDE) test strip Use as instructed 150 each 2   insulin glargine, 2 Unit Dial, (TOUJEO MAX SOLOSTAR) 300 UNIT/ML Solostar Pen Inject 80 Units into the skin daily. 4 pen 2   levothyroxine (SYNTHROID) 75 MCG tablet Take 1 tablet (75 mcg total) by mouth daily. 90 tablet 0   LINZESS 290 MCG CAPS capsule TAKE 1 CAPSULE(290 MCG) BY MOUTH DAILY BEFORE BREAKFAST (Patient taking  differently: Take 290 mcg by mouth daily before breakfast.) 90 capsule 3   losartan (COZAAR) 50 MG tablet Take 1 tablet (50 mg total) by mouth daily. 90 tablet 0   ondansetron (ZOFRAN) 4 MG tablet Take 1 tablet (4 mg total) by mouth every 6 (six) hours. as needed for nausea or vomiting. (Patient taking differently: Take 4 mg by mouth every 6 (six) hours as needed for nausea or vomiting. as needed for nausea or vomiting.) 10 tablet 2   pantoprazole (PROTONIX) 40 MG tablet Take by mouth.     promethazine (PHENERGAN) 25 MG tablet Take 25 mg by mouth every 6 (six) hours as needed for nausea or vomiting.      risperiDONE (RISPERDAL) 2 MG tablet Take one tablet qam three tablets at bedtime 360 tablet 2   traZODone (DESYREL) 100 MG tablet TAKE 2 TABLETS(200 MG) BY MOUTH AT BEDTIME 180 tablet 2   HYDROcodone-acetaminophen (NORCO/VICODIN) 5-325 MG tablet Take 1 tablet by mouth every 6 (six) hours as needed. (Patient not taking: Reported on 04/23/2021)     pantoprazole (PROTONIX) 40 MG tablet Take 1 tablet (40 mg total)  by mouth daily. (Patient not taking: Reported on 04/23/2021) 90 tablet 0   promethazine (PHENERGAN) 25 MG tablet Take by mouth. (Patient not taking: Reported on 04/23/2021)     No current facility-administered medications for this visit.    Allergies as of 04/23/2021   (No Known Allergies)    Family History  Problem Relation Age of Onset   Hypertension Mother    Alcohol abuse Mother    Heart disease Mother    Kidney disease Mother    Hypertension Father    Alcohol abuse Sister    Stroke Other    Diabetes Other    Cancer Other    Seizures Other    Alcohol abuse Maternal Aunt    Alcohol abuse Paternal Aunt    Alcohol abuse Maternal Grandfather    Alcohol abuse Maternal Grandmother    Alcohol abuse Cousin    Hearing loss Daughter     Social History   Socioeconomic History   Marital status: Married    Spouse name: Not on file   Number of children: Not on file   Years of education: Not on file   Highest education level: Not on file  Occupational History   Not on file  Tobacco Use   Smoking status: Never   Smokeless tobacco: Never  Vaping Use   Vaping Use: Never used  Substance and Sexual Activity   Alcohol use: No    Alcohol/week: 0.0 standard drinks   Drug use: No   Sexual activity: Not Currently    Birth control/protection: Surgical  Other Topics Concern   Not on file  Social History Narrative   Married for 22 years.On disability secondary to schizophrenia.Lives with husband.   Social Determinants of Health   Financial Resource Strain: Not on file  Food Insecurity: Not on file  Transportation Needs: Not on file  Physical Activity: Not on file  Stress: Not on file  Social Connections: Not on file    Review of systems General: negative for malaise, night sweats, fever, chills, +weight loss Neck: Negative for lumps, goiter, pain and significant neck swelling Resp: Negative for cough, wheezing, dyspnea at rest CV: Negative for chest pain, leg swelling,  palpitations, orthopnea GI: denies hematochezia, nausea, vomiting, diarrhea, odyonophagia, +dysphagia, +constipation, +melena, +epigastric pain +early satiety +weight loss MSK: Negative for joint pain or swelling, back pain, and  muscle pain. Derm: Negative for itching or rash Psych: Denies depression, anxiety, memory loss, confusion. No homicidal or suicidal ideation.  Heme: Negative for prolonged bleeding, bruising easily, and swollen nodes. Endocrine: Negative for cold or heat intolerance, polyuria, polydipsia and goiter. Neuro: negative for tremor, gait imbalance, syncope and seizures. The remainder of the review of systems is noncontributory.  Physical Exam: BP 118/86 (BP Location: Left Arm, Patient Position: Sitting, Cuff Size: Normal)   Pulse (!) 108   Temp 98.5 F (36.9 C) (Oral)   Ht _0  (1.575 m)   Wt 152 lb 8 oz (69.2 kg)   LMP 01/23/2013   BMI 27.89 kg/m  General:   Alert and oriented. No distress noted. Pleasant and cooperative.  Head:  Normocephalic and atraumatic. Eyes:  Conjuctiva clear without scleral icterus. Mouth:  Oral mucosa pink and moist. Good dentition. No lesions. Heart: Normal rate and rhythm, s1 and s2 heart sounds present.  Lungs: Clear lung sounds in all lobes. Respirations equal and unlabored. Abdomen:  +BS, soft, non-distended. No rebound or guarding. No HSM or masses noted. Mild TTP of epigastric region Derm: No palmar erythema or jaundice Msk:  Symmetrical without gross deformities. Normal posture. Extremities:  Without edema. Neurologic:  Alert and  oriented x4 Psych:  Alert and cooperative. Normal mood and affect.  Invalid input(s): 6 MONTHS   ASSESSMENT: Kathryn [name] is a 50 y.o. female presenting today for worsening epigastric pain, melena and dysphagia.   Upper abdominal pain was previously controlled on Pantoprazole 59m one daily, she reports that she ran out of this a couple weeks ago, epigastric pain is worse now with early satiety  and some weight loss, approx 5 lbs over the past few weeks. She is also endorsing melena, not in the setting of PO iron, pepto bismol or any other new medications. She is also having dysphagia anytime she eats meat without any episodes of food impaction. She should restart PPI. We will schedule EGD for further evaluation of dysphagia and melena. Dysphagia could be related to stenosis/stricture of esophagus or esophagitis and given presentation with some epigastric tenderness, PUD/esophagitis cannot be ruled out as cause of melena.   Constipation previously well controlled on linzess 2976m daily with one BM once a day, however, over the past few weeks she has only had a BM 1-2x/week without any reported straining. She should continue linzess 29021mdaily and add miralax 1 capful a day. She will let me know if constipation does not improve with this. Continue diet high in fruits/veggies and water.     PLAN:  Restart protonix 16m109mily, refill sent 2. Continue Linzess 290mc27mily, refill sent 3.  add Miralax 1 capful daily 4.  Proceed with EGD for further evaluation of melena and dysphagia 5. Check CBC today    Follow Up: TBD after EGD  Tanish Prien L. CarlaAlver Sorrow, APRN, AGNP-C Adult-Gerontology Nurse Practitioner [facility]

## 2021-04-24 DIAGNOSIS — K921 Melena: Secondary | ICD-10-CM | POA: Diagnosis not present

## 2021-04-24 LAB — CBC
HCT: 36.1 % (ref 35.0–45.0)
Hemoglobin: 11.8 g/dL (ref 11.7–15.5)
MCH: 28.4 pg (ref 27.0–33.0)
MCHC: 32.7 g/dL (ref 32.0–36.0)
MCV: 87 fL (ref 80.0–100.0)
MPV: 12 fL (ref 7.5–12.5)
Platelets: 288 10*3/uL (ref 140–400)
RBC: 4.15 10*6/uL (ref 3.80–5.10)
RDW: 12.1 % (ref 11.0–15.0)
WBC: 6 10*3/uL (ref 3.8–10.8)

## 2021-04-29 ENCOUNTER — Encounter (INDEPENDENT_AMBULATORY_CARE_PROVIDER_SITE_OTHER): Payer: Self-pay

## 2021-04-29 ENCOUNTER — Other Ambulatory Visit (INDEPENDENT_AMBULATORY_CARE_PROVIDER_SITE_OTHER): Payer: Self-pay

## 2021-04-29 DIAGNOSIS — R1013 Epigastric pain: Secondary | ICD-10-CM

## 2021-04-29 DIAGNOSIS — K921 Melena: Secondary | ICD-10-CM

## 2021-04-29 DIAGNOSIS — R1319 Other dysphagia: Secondary | ICD-10-CM

## 2021-05-01 ENCOUNTER — Encounter (INDEPENDENT_AMBULATORY_CARE_PROVIDER_SITE_OTHER): Payer: Self-pay

## 2021-05-03 ENCOUNTER — Ambulatory Visit (HOSPITAL_COMMUNITY)
Admission: RE | Admit: 2021-05-03 | Discharge: 2021-05-03 | Disposition: A | Discharge status: Home / Self Care | Payer: BC Managed Care – PPO | Attending: Gastroenterology | Admitting: Gastroenterology

## 2021-05-03 ENCOUNTER — Ambulatory Visit (HOSPITAL_COMMUNITY): Payer: BC Managed Care – PPO | Admitting: Anesthesiology

## 2021-05-03 ENCOUNTER — Other Ambulatory Visit: Payer: Self-pay

## 2021-05-03 ENCOUNTER — Encounter (HOSPITAL_COMMUNITY)
Admission: RE | Disposition: A | Discharge status: Home / Self Care | Payer: Self-pay | Source: Home / Self Care | Attending: Gastroenterology

## 2021-05-03 DIAGNOSIS — J45909 Unspecified asthma, uncomplicated: Secondary | ICD-10-CM | POA: Diagnosis not present

## 2021-05-03 DIAGNOSIS — I10 Essential (primary) hypertension: Secondary | ICD-10-CM | POA: Diagnosis not present

## 2021-05-03 DIAGNOSIS — R1310 Dysphagia, unspecified: Secondary | ICD-10-CM | POA: Diagnosis not present

## 2021-05-03 DIAGNOSIS — Z79899 Other long term (current) drug therapy: Secondary | ICD-10-CM | POA: Diagnosis not present

## 2021-05-03 DIAGNOSIS — K921 Melena: Secondary | ICD-10-CM

## 2021-05-03 DIAGNOSIS — R1319 Other dysphagia: Secondary | ICD-10-CM

## 2021-05-03 DIAGNOSIS — Z7984 Long term (current) use of oral hypoglycemic drugs: Secondary | ICD-10-CM | POA: Diagnosis not present

## 2021-05-03 DIAGNOSIS — K3189 Other diseases of stomach and duodenum: Secondary | ICD-10-CM

## 2021-05-03 DIAGNOSIS — K2970 Gastritis, unspecified, without bleeding: Secondary | ICD-10-CM | POA: Diagnosis not present

## 2021-05-03 DIAGNOSIS — R1013 Epigastric pain: Secondary | ICD-10-CM

## 2021-05-03 DIAGNOSIS — E039 Hypothyroidism, unspecified: Secondary | ICD-10-CM | POA: Diagnosis not present

## 2021-05-03 DIAGNOSIS — E1165 Type 2 diabetes mellitus with hyperglycemia: Secondary | ICD-10-CM | POA: Diagnosis not present

## 2021-05-03 DIAGNOSIS — K319 Disease of stomach and duodenum, unspecified: Secondary | ICD-10-CM | POA: Diagnosis not present

## 2021-05-03 DIAGNOSIS — K259 Gastric ulcer, unspecified as acute or chronic, without hemorrhage or perforation: Secondary | ICD-10-CM | POA: Diagnosis not present

## 2021-05-03 DIAGNOSIS — K219 Gastro-esophageal reflux disease without esophagitis: Secondary | ICD-10-CM | POA: Insufficient documentation

## 2021-05-03 DIAGNOSIS — K30 Functional dyspepsia: Secondary | ICD-10-CM

## 2021-05-03 HISTORY — PX: ESOPHAGOGASTRODUODENOSCOPY (EGD) WITH PROPOFOL: SHX5813

## 2021-05-03 HISTORY — PX: BIOPSY: SHX5522

## 2021-05-03 HISTORY — PX: SAVORY DILATION: SHX5439

## 2021-05-03 LAB — GLUCOSE, CAPILLARY: Glucose-Capillary: 265 mg/dL — ABNORMAL HIGH (ref 70–99)

## 2021-05-03 SURGERY — ESOPHAGOGASTRODUODENOSCOPY (EGD) WITH PROPOFOL
Anesthesia: General

## 2021-05-03 MED ORDER — LIDOCAINE HCL (CARDIAC) PF 100 MG/5ML IV SOSY
PREFILLED_SYRINGE | INTRAVENOUS | Status: DC | PRN
  Administered 2021-05-03: 50 mg via INTRAVENOUS

## 2021-05-03 MED ORDER — PANTOPRAZOLE SODIUM 40 MG PO TBEC: 40.0000 mg | DELAYED_RELEASE_TABLET | Freq: Two times a day (BID) | ORAL | 0 refills | Status: DC

## 2021-05-03 MED ORDER — LACTATED RINGERS IV SOLN
INTRAVENOUS | Status: DC
  Administered 2021-05-03: 1000 mL via INTRAVENOUS

## 2021-05-03 MED ORDER — PROPOFOL 500 MG/50ML IV EMUL
INTRAVENOUS | Status: DC | PRN
  Administered 2021-05-03: 150 ug/kg/min via INTRAVENOUS

## 2021-05-03 MED ORDER — PROPOFOL 10 MG/ML IV BOLUS
INTRAVENOUS | Status: DC | PRN
  Administered 2021-05-03: 100 mg via INTRAVENOUS

## 2021-05-03 NOTE — Anesthesia Preprocedure Evaluation (Addendum)
Anesthesia Evaluation  Patient identified by MRN, date of birth, ID band Patient awake    Reviewed: Allergy & Precautions, NPO status , Patient's Chart, lab work & pertinent test results  Airway Mallampati: II  TM Distance: >3 FB Neck ROM: Full    Dental  (+) Dental Advisory Given, Missing   Pulmonary asthma ,    Pulmonary exam normal breath sounds clear to auscultation       Cardiovascular Exercise Tolerance: Good hypertension, Pt. on medications Normal cardiovascular exam Rhythm:Regular Rate:Normal     Neuro/Psych PSYCHIATRIC DISORDERS Anxiety Depression Schizophrenia  Neuromuscular disease    GI/Hepatic GERD  Medicated,  Endo/Other  diabetes, Poorly Controlled, Type 2, Insulin Dependent, Oral Hypoglycemic AgentsHypothyroidism   Renal/GU      Musculoskeletal   Abdominal   Peds  Hematology  (+) Blood dyscrasia, anemia ,   Anesthesia Other Findings Back pain  Reproductive/Obstetrics                            Anesthesia Physical Anesthesia Plan  ASA: 3  Anesthesia Plan: General   Post-op Pain Management: Minimal or no pain anticipated   Induction: Intravenous  PONV Risk Score and Plan: TIVA  Airway Management Planned: Nasal Cannula and Natural Airway  Additional Equipment:   Intra-op Plan:   Post-operative Plan:   Informed Consent:     Dental advisory given  Plan Discussed with: CRNA and Surgeon  Anesthesia Plan Comments:         Anesthesia Quick Evaluation

## 2021-05-03 NOTE — Interval H&P Note (Signed)
History and Physical Interval Note:  05/03/2021 11:59 AM  Kathryn [name]  has presented today for surgery, with the diagnosis of Melena dysphyagia epigastric pain.  The various methods of treatment have been discussed with the patient and family. After consideration of risks, benefits and other options for treatment, the patient has consented to  Procedure(s) with comments: ESOPHAGOGASTRODUODENOSCOPY (EGD) WITH PROPOFOL (N/A) - 1:20 as a surgical intervention.  The patient's history has been reviewed, patient examined, no change in status, stable for surgery.  I have reviewed the patient's chart and labs.  Questions were answered to the patient's satisfaction.     [name]

## 2021-05-03 NOTE — Discharge Instructions (Addendum)
You are being discharged to home.  Resume your previous diet.  We are waiting for your pathology results.  Take Protonix (pantoprazole) 40 mg by mouth twice a day for three months.  Do not take any ibuprofen (including Advil, Motrin or Nuprin), naproxen, or other non-steroidal anti-inflammatory drugs.

## 2021-05-03 NOTE — Op Note (Addendum)
[facility] Patient Name: Kathryn [name] Procedure Date: 05/03/2021 12:01 PM MRN: [id number] Date of Birth: 03-15-1971 Attending MD: Maylon Peppers ,  CSN: [id number] Age: 50 Admit Type: Outpatient Procedure:                Upper GI endoscopy Indications:              Epigastric abdominal pain, Dysphagia Providers:                Maylon Peppers, Rosina Lowenstein, RN, Raphael Gibney,                            Technician Referring MD:              Medicines:                Monitored Anesthesia Care Complications:            No immediate complications. Estimated Blood Loss:     Estimated blood loss: none. Procedure:                Pre-Anesthesia Assessment:                           - Prior to the procedure, a History and Physical                            was performed, and patient medications, allergies                            and sensitivities were reviewed. The patient's                            tolerance of previous anesthesia was reviewed.                           - The risks and benefits of the procedure and the                            sedation options and risks were discussed with the                            patient. All questions were answered and informed                            consent was obtained.                           - ASA Grade Assessment: II - A patient with mild                            systemic disease.                           After obtaining informed consent, the endoscope was                            passed under direct vision. Throughout the  procedure, the patient's blood pressure, pulse, and                            oxygen saturations were monitored continuously. The                            GIF-H190 ([id number]) scope was introduced through the                            mouth, and advanced to the second part of duodenum.                            The upper GI endoscopy was accomplished without                             difficulty. The patient tolerated the procedure                            well. Scope In: 12:31:10 PM Scope Out: 12:41:17 PM Total Procedure Duration: 0 hours 10 minutes 7 seconds  Findings:      No endoscopic abnormality was evident in the esophagus to explain the       patient's complaint of dysphagia. It was decided, however, to proceed       with dilation of the entire esophagus. A guidewire was placed and the       scope was withdrawn. Dilation was performed with a Savary dilator with       no resistance at 18 mm. No hematin seen upon reisnpection.      Segmental mild inflammation characterized by erythema was found in the       gastric antrum. Biopsies were taken with a cold forceps for Helicobacter       pylori testing.      Localized mildly erythematous mucosa without active bleeding and with no       stigmata of bleeding was found in the first portion of the duodenum.       Biopsies from the normal duodenum were taken with a cold forceps for       histology. Impression:               - No endoscopic esophageal abnormality to explain                            patient's dysphagia. Esophagus dilated.                           - Gastritis. Biopsied.                           - Erythematous duodenopathy. Biopsied. Moderate Sedation:      Per Anesthesia Care Recommendation:           - Discharge patient to home (ambulatory).                           - Resume previous diet.                           -  Await pathology results.                           - Use Protonix (pantoprazole) 40 mg PO BID for 3                            months, then go down to once a day dosing.                           - No ibuprofen, naproxen, or other non-steroidal                            anti-inflammatory drugs.                           - Check H. pylori serology Procedure Code(s):        --- Professional ---                           [phone], Esophagogastroduodenoscopy, flexible,                             transoral; with insertion of guide wire followed by                            passage of dilator(s) through esophagus over guide                            wire                           43239, 59, Esophagogastroduodenoscopy, flexible,                            transoral; with biopsy, single or multiple Diagnosis Code(s):        --- Professional ---                           R13.10, Dysphagia, unspecified                           K29.70, Gastritis, unspecified, without bleeding                           K31.89, Other diseases of stomach and duodenum                           R10.13, Epigastric pain CPT copyright 2019 American Medical Association. All rights reserved. The codes documented in this report are preliminary and upon coder review may  be revised to meet current compliance requirements. Maylon Peppers, MD Maylon Peppers,  05/03/2021 12:57:09 PM This report has been signed electronically. Number of Addenda: 0

## 2021-05-03 NOTE — Anesthesia Procedure Notes (Signed)
Date/Time: 05/03/2021 12:30 PM Performed by: Orlie Dakin, CRNA Pre-anesthesia Checklist: Patient identified, Emergency Drugs available, Suction available and Patient being monitored Patient Re-evaluated:Patient Re-evaluated prior to induction Oxygen Delivery Method: Nasal cannula Induction Type: IV induction Placement Confirmation: positive ETCO2

## 2021-05-03 NOTE — Transfer of Care (Signed)
Immediate Anesthesia Transfer of Care Note  Patient: Kathryn [name]  Procedure(s) Performed: ESOPHAGOGASTRODUODENOSCOPY (EGD) WITH PROPOFOL BIOPSY SAVORY DILATION  Patient Location: Endoscopy Unit  Anesthesia Type:General  Level of Consciousness: drowsy  Airway & Oxygen Therapy: Patient Spontanous Breathing  Post-op Assessment: Report given to RN and Post -op Vital signs reviewed and stable  Post vital signs: Reviewed and stable  Last Vitals:  Vitals Value Taken Time  BP    Temp    Pulse    Resp    SpO2      Last Pain:  Vitals:   05/03/21 1230  TempSrc:   PainSc: 8       Patients Stated Pain Goal: 9 (52/17/47 1595)  Complications: No notable events documented.

## 2021-05-03 NOTE — Anesthesia Procedure Notes (Signed)
Date/Time: 05/03/2021 12:32 PM Performed by: Orlie Dakin, CRNA Pre-anesthesia Checklist: Patient identified, Emergency Drugs available, Suction available and Patient being monitored Patient Re-evaluated:Patient Re-evaluated prior to induction Oxygen Delivery Method: Nasal cannula Induction Type: IV induction Placement Confirmation: positive ETCO2

## 2021-05-03 NOTE — Anesthesia Postprocedure Evaluation (Signed)
Anesthesia Post Note  Patient: Kathryn [name]  Procedure(s) Performed: ESOPHAGOGASTRODUODENOSCOPY (EGD) WITH PROPOFOL BIOPSY SAVORY DILATION  Patient location during evaluation: Endoscopy Anesthesia Type: General Level of consciousness: awake and alert and oriented Pain management: pain level controlled Vital Signs Assessment: post-procedure vital signs reviewed and stable Respiratory status: spontaneous breathing, nonlabored ventilation and respiratory function stable Cardiovascular status: blood pressure returned to baseline and stable Postop Assessment: no apparent nausea or vomiting Anesthetic complications: no   No notable events documented.   Last Vitals:  Vitals:   05/03/21 1245 05/03/21 1248  BP: (!) 102/58 113/72  Pulse: 82 71  Resp: (!) 21 18  Temp: 36.6 C   SpO2: 99% 100%    Last Pain:  Vitals:   05/03/21 1248  TempSrc:   PainSc: 2                  [name] [name]

## 2021-05-06 LAB — H. PYLORI ANTIBODY, IGG: H Pylori IgG: 0.5 Index Value (ref 0.00–0.79)

## 2021-05-06 LAB — SURGICAL PATHOLOGY

## 2021-05-07 ENCOUNTER — Encounter (INDEPENDENT_AMBULATORY_CARE_PROVIDER_SITE_OTHER): Payer: Self-pay | Admitting: *Deleted

## 2021-05-07 ENCOUNTER — Encounter (HOSPITAL_COMMUNITY): Payer: Self-pay | Admitting: Gastroenterology

## 2021-05-08 ENCOUNTER — Telehealth (INDEPENDENT_AMBULATORY_CARE_PROVIDER_SITE_OTHER): Payer: Self-pay

## 2021-05-08 NOTE — Telephone Encounter (Signed)
Patient called for her results of the recent EGD done on 05/03/2021. I spoke with her and she is aware of all.       Pathology showed normal small bowel biopsies, stomach with changes consistent with erosion but negative for H. pylori, no need to repeat EGD.  She will need to continue with the PPI twice a day as directed.

## 2021-05-09 ENCOUNTER — Other Ambulatory Visit (HOSPITAL_COMMUNITY): Payer: Self-pay | Admitting: *Deleted

## 2021-05-09 DIAGNOSIS — E039 Hypothyroidism, unspecified: Secondary | ICD-10-CM | POA: Diagnosis not present

## 2021-05-09 DIAGNOSIS — I10 Essential (primary) hypertension: Secondary | ICD-10-CM | POA: Diagnosis not present

## 2021-05-09 DIAGNOSIS — Z1231 Encounter for screening mammogram for malignant neoplasm of breast: Secondary | ICD-10-CM

## 2021-05-09 DIAGNOSIS — Z1151 Encounter for screening for human papillomavirus (HPV): Secondary | ICD-10-CM | POA: Diagnosis not present

## 2021-05-09 DIAGNOSIS — Z0001 Encounter for general adult medical examination with abnormal findings: Secondary | ICD-10-CM | POA: Diagnosis not present

## 2021-05-09 DIAGNOSIS — E785 Hyperlipidemia, unspecified: Secondary | ICD-10-CM | POA: Diagnosis not present

## 2021-05-09 DIAGNOSIS — E119 Type 2 diabetes mellitus without complications: Secondary | ICD-10-CM | POA: Diagnosis not present

## 2021-05-09 DIAGNOSIS — Z599 Problem related to housing and economic circumstances, unspecified: Secondary | ICD-10-CM | POA: Diagnosis not present

## 2021-05-16 ENCOUNTER — Telehealth (INDEPENDENT_AMBULATORY_CARE_PROVIDER_SITE_OTHER): Payer: Self-pay | Admitting: *Deleted

## 2021-05-16 NOTE — Telephone Encounter (Signed)
Pt seen 04/24/21 for abdominal pain. She states she [facility] and was told her hemoglobin was low at 11.9 and states she was told you are bleeding somewhere and need to call GI doctor. Pt states she has not seen any blood in stools but abdominal pain has gotten worse since being seen. States 3 times in past week pain has made her ball up into fetal position. I advised if she is having pain that severe she should go to ED or if pain returned go to ED. Pt still wanted me to send back a message for chelsea.   [phone]

## 2021-05-16 NOTE — Telephone Encounter (Signed)
Left message to return call 

## 2021-05-22 NOTE — Telephone Encounter (Signed)
Thanks for the update.  If still having blood in stool, she may need a repeat colonoscopy (needs a 2 day prep and liquid diet for 5 days before procedure given inadequate prep in most recent colonoscopy). Please let us know if the patient is interested and we can schedule. Also, need to continue Protonix for abdominal pain for now.

## 2021-05-22 NOTE — Telephone Encounter (Signed)
Talked with pt. Pt states she did not go to ED because after taking protonix her abdominal pain is now gone. Has not had any pain since last week when I spoke with her. Patient did say she saw dark blood twice yesterday when wiping after having a bowel movement.

## 2021-05-22 NOTE — Telephone Encounter (Signed)
Called and discussed with pt per Dr. Jenetta Downer - If still having blood in stool, she may need a repeat colonoscopy (needs a 2 day prep and liquid diet for 5 days before procedure given inadequate prep in most recent colonoscopy). Please let us know if the patient is interested and we can schedule. Also, need to continue Protonix for abdominal pain for now.  Pt verbalized understanding and will let us know.

## 2021-05-23 ENCOUNTER — Telehealth: Payer: Self-pay | Admitting: "Endocrinology

## 2021-05-23 DIAGNOSIS — E119 Type 2 diabetes mellitus without complications: Secondary | ICD-10-CM

## 2021-05-23 DIAGNOSIS — E039 Hypothyroidism, unspecified: Secondary | ICD-10-CM

## 2021-05-23 NOTE — Telephone Encounter (Signed)
Mailed pt lab orders, informed pt to call us back once she completes labs.

## 2021-05-23 NOTE — Telephone Encounter (Signed)
Pt called and is requesting an appointment but has not been seen since march. Does pt need labs?

## 2021-05-29 ENCOUNTER — Telehealth (HOSPITAL_COMMUNITY): Payer: BC Managed Care – PPO | Admitting: Psychiatry

## 2021-05-29 ENCOUNTER — Other Ambulatory Visit: Payer: Self-pay

## 2021-05-31 ENCOUNTER — Other Ambulatory Visit (HOSPITAL_COMMUNITY): Payer: Self-pay | Admitting: Psychiatry

## 2021-05-31 NOTE — Telephone Encounter (Signed)
Call for appt, missed last appt

## 2021-06-04 ENCOUNTER — Other Ambulatory Visit: Payer: Self-pay

## 2021-06-04 ENCOUNTER — Telehealth (HOSPITAL_COMMUNITY): Payer: BC Managed Care – PPO | Admitting: Psychiatry

## 2021-06-07 ENCOUNTER — Other Ambulatory Visit: Payer: Self-pay

## 2021-06-07 ENCOUNTER — Telehealth (HOSPITAL_COMMUNITY): Payer: BC Managed Care – PPO | Admitting: Psychiatry

## 2021-06-12 NOTE — Telephone Encounter (Signed)
LMOM

## 2021-06-19 ENCOUNTER — Other Ambulatory Visit: Payer: Self-pay

## 2021-06-19 ENCOUNTER — Encounter (HOSPITAL_COMMUNITY): Payer: Self-pay | Admitting: Psychiatry

## 2021-06-19 ENCOUNTER — Telehealth (INDEPENDENT_AMBULATORY_CARE_PROVIDER_SITE_OTHER): Payer: BC Managed Care – PPO | Admitting: Psychiatry

## 2021-06-19 DIAGNOSIS — F4310 Post-traumatic stress disorder, unspecified: Secondary | ICD-10-CM

## 2021-06-19 DIAGNOSIS — J4540 Moderate persistent asthma, uncomplicated: Secondary | ICD-10-CM | POA: Diagnosis not present

## 2021-06-19 DIAGNOSIS — F251 Schizoaffective disorder, depressive type: Secondary | ICD-10-CM | POA: Diagnosis not present

## 2021-06-19 MED ORDER — CLONAZEPAM 0.5 MG PO TABS: 0.5000 mg | ORAL_TABLET | Freq: Two times a day (BID) | ORAL | 2 refills | Status: DC | PRN

## 2021-06-19 MED ORDER — BENZTROPINE MESYLATE 1 MG PO TABS: ORAL_TABLET | ORAL | 2 refills | Status: DC

## 2021-06-19 MED ORDER — RISPERIDONE 2 MG PO TABS: ORAL_TABLET | ORAL | 3 refills | Status: DC

## 2021-06-19 MED ORDER — DULOXETINE HCL 60 MG PO CPEP: 60.0000 mg | ORAL_CAPSULE | Freq: Two times a day (BID) | ORAL | 2 refills | Status: DC

## 2021-06-19 MED ORDER — TRAZODONE HCL 100 MG PO TABS: ORAL_TABLET | ORAL | 2 refills | Status: DC

## 2021-06-19 NOTE — Progress Notes (Signed)
Virtual Visit via Telephone Note  I connected with Kathryn [name] on 06/19/21 at  9:00 AM EST by telephone and verified that I am speaking with the correct person using two identifiers.  Location: Patient: home Provider: office   I discussed the limitations, risks, security and privacy concerns of performing an evaluation and management service by telephone and the availability of in person appointments. I also discussed with the patient that there may be a patient responsible charge related to this service. The patient expressed understanding and agreed to proceed.       I discussed the assessment and treatment plan with the patient. The patient was provided an opportunity to ask questions and all were answered. The patient agreed with the plan and demonstrated an understanding of the instructions.   The patient was advised to call back or seek an in-person evaluation if the symptoms worsen or if the condition fails to improve as anticipated.  I provided 14 minutes of non-face-to-face time during this encounter.   Levonne Spiller, MD  [facility] MD/PA/NP OP Progress Note  06/19/2021 9:27 AM Kathryn [name]  MRN:  [id number]  Chief Complaint:  Chief Complaint   Schizophrenia; Depression; Follow-up    HPI: This patient is a 51 year old separated black female who lives with her daughter in [place].  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  She states that she is continuing to work the night shift at her job.  She has to sleep during the day but on the weekends she sleeps both day and night.  Apparently her husband thinks she is sleeping too much.  She is probably worn out from the job.  I urged her to try to wake up a little bit in the afternoons.  In terms of mood she states she is doing okay.  She denies serious depression or thoughts of self-harm or suicidal ideation.  She still  sometimes hears voices telling her "be violent" but she states she would never act on this.  She does think that Risperdal has helped diminish these quite a bit.  She denies any other hallucinations.  She she denies any blinking or jerking or other side effects.  She does think the clonazepam helps when she feels anxious. Visit Diagnosis:    ICD-10-CM   1. Schizoaffective disorder, depressive type ([place])  F25.1     2. Moderate persistent asthma, unspecified whether complicated  H82.99 DULoxetine (CYMBALTA) 60 MG capsule    3. PTSD (post-traumatic stress disorder)  F43.10       Past Psychiatric History: 2 previous psychiatric hospitalizations for schizoaffective disorder  Past Medical History:  Past Medical History:  Diagnosis Date   Anemia    Anxiety    Asthma    Depression    GERD (gastroesophageal reflux disease)    HLD (hyperlipidemia) 04/07/2019   Hypertension    Hypothyroidism, adult 04/07/2019   Neuropathy    Obesity (BMI 30.0-34.9) 04/07/2019   Schizoaffective disorder ([place])    Type II diabetes mellitus, uncontrolled 04/27/2019    Past Surgical History:  Procedure Laterality Date   BACK SURGERY  09/06/2020   BIOPSY  05/03/2021   Procedure: BIOPSY;  Surgeon: [name], MD;  [facility];  Service: Gastroenterology;;   BREAST SURGERY Right    biopsy   CESAREAN SECTION     CHOLECYSTECTOMY  06/02/2012   Procedure: LAPAROSCOPIC  CHOLECYSTECTOMY;  Surgeon: Jamesetta So, MD;  Location: [facility];  Service: General;  Laterality: N/A;  Attempted laparoscopic cholecystectomy   CHOLECYSTECTOMY  06/02/2012   Procedure: CHOLECYSTECTOMY;  Surgeon: Jamesetta So, MD;  Location: [facility];  Service: General;  Laterality: N/A;  converted to open at  0905   COLONOSCOPY WITH PROPOFOL N/A 07/18/2020   prep was fair, one 33m polyp (inflammatory) stool in descending colon, transverse and ascending, distal rectum and anal verge normal   ESOPHAGEAL DILATION  12/31/2018    Procedure: ESOPHAGEAL DILATION;  Surgeon: RRogene Houston MD;  [facility];  Service: Endoscopy;;   ESOPHAGOGASTRODUODENOSCOPY (EGD) WITH PROPOFOL N/A 08/24/2015   Procedure: ESOPHAGOGASTRODUODENOSCOPY (EGD) WITH PROPOFOL;  Surgeon: NRogene Houston MD;  [facility];  Service: Endoscopy;  Laterality: N/A;  1:10 - Ann to notify pt to arrive at 11:30   ESOPHAGOGASTRODUODENOSCOPY (EGD) WITH PROPOFOL N/A 12/31/2018   normal, no abnormality to explain dysphagia, esophagus dilated.   ESOPHAGOGASTRODUODENOSCOPY (EGD) WITH PROPOFOL N/A 05/03/2021   Procedure: ESOPHAGOGASTRODUODENOSCOPY (EGD) WITH PROPOFOL;  Surgeon: CHarvel Quale MD;  [facility];  Service: Gastroenterology;  Laterality: N/A;  1:20   FLEXIBLE SIGMOIDOSCOPY  07/17/2020   Procedure: FLEXIBLE SIGMOIDOSCOPY;  Surgeon: CMontez Morita DQuillian Quince MD;  [facility];  Service: Gastroenterology;;   POLYPECTOMY  07/18/2020   Procedure: POLYPECTOMY INTESTINAL;  Surgeon: CMontez Morita DQuillian Quince MD;  [facility];  Service: Gastroenterology;;  ascending colon polyp;    SAVORY DILATION  05/03/2021   Procedure: SAVORY DILATION;  Surgeon: CHarvel Quale MD;  [facility];  Service: Gastroenterology;;   tooth removal  2019   all teeth removed   TUBAL LIGATION     X3    Family Psychiatric History: see below  Family History:  Family History  Problem Relation Age of Onset   Hypertension Mother    Alcohol abuse Mother    Heart disease Mother    Kidney disease Mother    Hypertension Father    Alcohol abuse Sister    Stroke Other    Diabetes Other    Cancer Other    Seizures Other    Alcohol abuse Maternal Aunt    Alcohol abuse Paternal Aunt    Alcohol abuse Maternal Grandfather    Alcohol abuse Maternal Grandmother    Alcohol abuse Cousin    Hearing loss Daughter     Social History:  Social History   Socioeconomic History   Marital  status: Married    Spouse name: Not on file   Number of children: Not on file   Years of education: Not on file   Highest education level: Not on file  Occupational History   Not on file  Tobacco Use   Smoking status: Never   Smokeless tobacco: Never  Vaping Use   Vaping Use: Never used  Substance and Sexual Activity   Alcohol use: No    Alcohol/week: 0.0 standard drinks   Drug use: No   Sexual activity: Not Currently    Birth control/protection: Surgical  Other Topics Concern   Not on file  Social History Narrative   Married for 22 years.On disability secondary to schizophrenia.Lives with husband.   Social Determinants of Health   Financial Resource Strain: Not on file  Food Insecurity: Not on file  Transportation Needs: Not on file  Physical Activity: Not on file  Stress: Not on file  Social Connections: Not on file  Allergies: No Known Allergies  Metabolic Disorder Labs: Lab Results  Component Value Date   HGBA1C 13.2 (H) 07/30/2020   MPG 332 07/30/2020   MPG  07/19/2019     Comment:     eAG cannot be calculated. Hemoglobin A1c result exceeds the linearity of the assay.    No results found for: PROLACTIN Lab Results  Component Value Date   CHOL 107 07/30/2020   TRIG 89 07/30/2020   HDL 53 07/30/2020   CHOLHDL 2.0 07/30/2020   LDLCALC 37 07/30/2020   LDLCALC 42 06/28/2020   Lab Results  Component Value Date   TSH CANCELED 11/01/2020   TSH 1.18 06/28/2020    Therapeutic Level Labs: Lab Results  Component Value Date   LITHIUM <0.06 (L) 07/03/2016   LITHIUM 1.17 09/09/2015   No results found for: VALPROATE No components found for:  CBMZ  Current Medications: Current Outpatient Medications  Medication Sig Dispense Refill   albuterol (VENTOLIN HFA) 108 (90 Base) MCG/ACT inhaler Inhale 2 puffs into the lungs every 6 (six) hours as needed for wheezing or shortness of breath. 8 g 2   aspirin EC 81 MG tablet Take 81 mg by mouth daily.      atorvastatin (LIPITOR) 10 MG tablet Take 1 tablet (10 mg total) by mouth at bedtime. 90 tablet 0   benztropine (COGENTIN) 1 MG tablet TAKE 1 TABLET(1 MG) BY MOUTH AT BEDTIME 90 tablet 2   Blood Glucose Monitoring Suppl (ACCU-CHEK GUIDE ME) w/Device KIT 1 Piece by Does not apply route as directed. 1 kit 0   carvedilol (COREG) 12.5 MG tablet Take 1 tablet (12.5 mg total) by mouth 2 (two) times daily. 180 tablet 0   Cholecalciferol (VITAMIN D-3) 125 MCG (5000 UT) TABS Take 2 tablets by mouth daily. 30 tablet 1   clonazePAM (KLONOPIN) 0.5 MG tablet Take 1 tablet (0.5 mg total) by mouth 2 (two) times daily as needed for anxiety. 180 tablet 2   dicyclomine (BENTYL) 10 MG capsule Take 10 mg by mouth daily.     DULoxetine (CYMBALTA) 60 MG capsule Take 1 capsule (60 mg total) by mouth 2 (two) times daily. 180 capsule 2   Exenatide ER (BYDUREON BCISE) 2 MG/0.85ML AUIJ Inject 2 mg into the skin every Monday.     FLOVENT HFA 44 MCG/ACT inhaler Inhale 2 puffs into the lungs in the morning and at bedtime. 1 each 3   gabapentin (NEURONTIN) 300 MG capsule Take 1 capsule (300 mg total) by mouth daily. 90 capsule 0   glipiZIDE (GLUCOTROL XL) 5 MG 24 hr tablet Take 1 tablet (5 mg total) by mouth daily with breakfast. 90 tablet 0   glucose blood (ACCU-CHEK GUIDE) test strip Use as instructed 150 each 2   HYDROcodone-acetaminophen (NORCO/VICODIN) 5-325 MG tablet Take 1 tablet by mouth every 6 (six) hours as needed for moderate pain.     insulin glargine, 2 Unit Dial, (TOUJEO MAX SOLOSTAR) 300 UNIT/ML Solostar Pen Inject 80 Units into the skin daily. 4 pen 2   levothyroxine (SYNTHROID) 75 MCG tablet Take 1 tablet (75 mcg total) by mouth daily. 90 tablet 0   linaclotide (LINZESS) 290 MCG CAPS capsule Take 1 capsule (290 mcg total) by mouth daily before breakfast. 90 capsule 3   losartan (COZAAR) 50 MG tablet Take 1 tablet (50 mg total) by mouth daily. 90 tablet 0   ondansetron (ZOFRAN) 4 MG tablet Take 1 tablet (4 mg  total) by mouth every 6 (six) hours. as needed  for nausea or vomiting. (Patient taking differently: Take 4 mg by mouth every 6 (six) hours as needed for nausea or vomiting. as needed for nausea or vomiting.) 10 tablet 2   pantoprazole (PROTONIX) 40 MG tablet Take 1 tablet (40 mg total) by mouth 2 (two) times daily. 180 tablet 0   Polyvinyl Alcohol-Povidone PF (REFRESH) 1.4-0.6 % SOLN Place 1 drop into both eyes daily as needed (dry eyes).     promethazine (PHENERGAN) 25 MG tablet Take 25 mg by mouth every 6 (six) hours as needed for nausea or vomiting.      risperiDONE (RISPERDAL) 2 MG tablet TAKE 1 TABLET EVERY MORNING AND 3 TABLETS AT BEDTIME 360 tablet 3   traZODone (DESYREL) 100 MG tablet TAKE 2 TABLETS(200 MG) BY MOUTH AT BEDTIME 180 tablet 2   No current facility-administered medications for this visit.     Musculoskeletal: Strength & Muscle Tone: na Gait & Station: na Patient leans: N/A  Psychiatric Specialty Exam: Review of Systems  Psychiatric/Behavioral:  Positive for hallucinations.   All other systems reviewed and are negative.  Last menstrual period 01/23/2013.There is no height or weight on file to calculate BMI.  General Appearance: NA  Eye Contact:  NA  Speech:  Clear and Coherent  Volume:  Normal  Mood:  Euthymic  Affect:  NA  Thought Process:  Goal Directed  Orientation:  Full (Time, Place, and Person)  Thought Content: WDL and Hallucinations: Auditory   Suicidal Thoughts:  No  Homicidal Thoughts:  No  Memory:  Immediate;   Good Recent;   Good Remote;   Fair  Judgement:  Good  Insight:  Fair  Psychomotor Activity:  Normal  Concentration:  Concentration: Good and Attention Span: Good  Recall:  Good  Fund of Knowledge: Good  Language: Good  Akathisia:  No  Handed:  Right  AIMS (if indicated): not done  Assets:  Communication Skills Desire for Improvement Resilience Social Support Talents/Skills  ADL's:  Intact  Cognition: WNL  Sleep:  Good    Screenings: PHQ2-9    Flowsheet Row Video Visit from 06/19/2021 in [place] Office Visit from 11/01/2020 in [place] Video Visit from 09/28/2020 in [place] [facility] Visit from 06/28/2020 in [place] Visit from 10/12/2019 in [facility]  PHQ-2 Total Score 2 1 0 4 0  PHQ-9 Total Score 4 1 -- 11 --      Flowsheet Row Video Visit from 06/19/2021 in [place] Admission (Discharged) from 05/03/2021 in [place] Video Visit from 09/28/2020 in [place] No Risk Low Risk No Risk        Assessment and Plan: This patient is a 51 year old female with a history of schizoaffective disorder and posttraumatic stress disorder.  For the most part she is doing okay.  She still has occasional auditory hallucinations but she knows how to manage them.  She will continue clonazepam 0.5 mg twice daily for anxiety, Risperdal 2 mg in the morning and 6 mg in the evening for schizophrenia, Cogentin 1 mg at bedtime to prevent side effects from Risperdal, Cymbalta 60 mg twice daily for depression and trazodone 200 mg at bedtime for sleep.  She will return to see me in 3 months   Levonne Spiller, MD 06/19/2021, 9:27 AM

## 2021-06-27 ENCOUNTER — Ambulatory Visit: Payer: BC Managed Care – PPO | Admitting: Cardiology

## 2021-07-01 ENCOUNTER — Ambulatory Visit (INDEPENDENT_AMBULATORY_CARE_PROVIDER_SITE_OTHER): Payer: BC Managed Care – PPO | Admitting: Nurse Practitioner

## 2021-07-01 ENCOUNTER — Encounter: Payer: Self-pay | Admitting: Nurse Practitioner

## 2021-07-01 ENCOUNTER — Other Ambulatory Visit: Payer: Self-pay

## 2021-07-01 VITALS — BP 132/88 | HR 99 | Ht 60.0 in | Wt 154.0 lb

## 2021-07-01 DIAGNOSIS — E039 Hypothyroidism, unspecified: Secondary | ICD-10-CM | POA: Diagnosis not present

## 2021-07-01 DIAGNOSIS — Z23 Encounter for immunization: Secondary | ICD-10-CM | POA: Diagnosis not present

## 2021-07-01 DIAGNOSIS — E559 Vitamin D deficiency, unspecified: Secondary | ICD-10-CM

## 2021-07-01 DIAGNOSIS — E782 Mixed hyperlipidemia: Secondary | ICD-10-CM

## 2021-07-01 DIAGNOSIS — F419 Anxiety disorder, unspecified: Secondary | ICD-10-CM

## 2021-07-01 DIAGNOSIS — I10 Essential (primary) hypertension: Secondary | ICD-10-CM | POA: Diagnosis not present

## 2021-07-01 DIAGNOSIS — K581 Irritable bowel syndrome with constipation: Secondary | ICD-10-CM

## 2021-07-01 DIAGNOSIS — J4540 Moderate persistent asthma, uncomplicated: Secondary | ICD-10-CM

## 2021-07-01 DIAGNOSIS — E1165 Type 2 diabetes mellitus with hyperglycemia: Secondary | ICD-10-CM

## 2021-07-01 DIAGNOSIS — K30 Functional dyspepsia: Secondary | ICD-10-CM

## 2021-07-01 DIAGNOSIS — E1143 Type 2 diabetes mellitus with diabetic autonomic (poly)neuropathy: Secondary | ICD-10-CM | POA: Insufficient documentation

## 2021-07-01 DIAGNOSIS — F250 Schizoaffective disorder, bipolar type: Secondary | ICD-10-CM

## 2021-07-01 DIAGNOSIS — K5900 Constipation, unspecified: Secondary | ICD-10-CM

## 2021-07-01 MED ORDER — BUDESONIDE-FORMOTEROL FUMARATE 80-4.5 MCG/ACT IN AERO
2.0000 | INHALATION_SPRAY | Freq: Two times a day (BID) | RESPIRATORY_TRACT | 3 refills | Status: DC
Start: 2021-07-01 — End: 2021-11-06

## 2021-07-01 MED ORDER — GABAPENTIN 300 MG PO CAPS: 300.0000 mg | ORAL_CAPSULE | Freq: Every day | ORAL | 0 refills | Status: DC

## 2021-07-01 MED ORDER — LEVOTHYROXINE SODIUM 75 MCG PO TABS: 75.0000 ug | ORAL_TABLET | Freq: Every day | ORAL | 0 refills | Status: DC

## 2021-07-01 NOTE — Assessment & Plan Note (Signed)
Takes clonazepam 0.5 mg 2 times daily as needed. Patient is followed by psych.

## 2021-07-01 NOTE — Assessment & Plan Note (Signed)
>>  ASSESSMENT AND PLAN FOR UNCONTROLLED DIABETES MELLITUS WITH HYPERGLYCEMIA, WITH LONG-TERM CURRENT USE OF INSULIN  (HCC) WRITTEN ON 07/01/2021  9:16 AM BY [place] Cookson, THOMES SAUNDERS, FNP  Lab Results  Component Value Date   HGBA1C 13.2 (H) 07/30/2020   Followed by Dr Lenis Mask  Exenatide  2 mg injection, glipizide  5mg  daily, toujeo  80mg  daily, she last saw Dr Lenis, in march 2022, pt stated that she will have labs ordered by Dr Lenis done by this week Saturday and she will call them to schedule a follow up appointment. Pt told to call this office as well to notify me that blood work has been done. Pt is on atorvastatin  10 mg daily, losartan  50 mg daily. Patient had a diabetic eye exam done last year, we will get records from [facility]. Patient is due for foot exam, will do these at her next visit. Importance of following up with Endo was discussed with patient, she verbalized understanding.

## 2021-07-01 NOTE — Assessment & Plan Note (Signed)
Patient educated on CDC recommendation for the vaccine. Verbal consent was obtained from the patient, vaccine administered by nurse, no sign of adverse reactions noted at this time. Patient education on arm soreness and use of tylenol or ibuprofen (if safe) for this patient  was discussed. Patient educated on the signs and symptoms of adverse effect and advise to contact the office if they occur °

## 2021-07-01 NOTE — Assessment & Plan Note (Signed)
Lab Results  Component Value Date   HGBA1C 13.2 (H) 07/30/2020   Followed by Dr Harvel Ricks  Exenatide 2 mg injection, glipizide 5mg  daily, toujeo 80mg  daily, she last saw Dr Dorris Fetch, in march 2022, pt stated that she will have labs ordered by Dr Dorris Fetch done by this week Saturday and she will call them to schedule a follow up appointment. Pt told to call this office as well to notify me that blood work has been done. Pt is on atorvastatin 10 mg daily, losartan 50 mg daily. Patient had a diabetic eye exam done last year, we will get records from [facility]. Patient is due for foot exam, will do these at her next visit. Importance of following up with Endo was discussed with patient, she verbalized understanding.

## 2021-07-01 NOTE — Assessment & Plan Note (Signed)
DASH diet and commitment to daily physical activity for a minimum of 30 minutes discussed and encouraged, as a part of hypertension management. The importance of attaining a healthy weight is also discussed.  BP/Weight 07/01/2021 05/03/2021 04/23/2021 11/01/2020 08/02/2020 11/25/5668 05/29/1028  Systolic BP 131 438 887 579 728 206 015  Diastolic BP 88 72 86 84 89 80 78  Wt. (Lbs) 154 152.5 152.5 159 157 156.6 155.6  BMI 30.08 27.89 27.89 29.08 28.72 28.64 29.4  Some encounter information is confidential and restricted. Go to Review Flowsheets activity to see all data.  Takes carvedilol 12.5 mg 2 times daily. Losartan 50 mg daily. EGFR ordered by Dr. Dorris Fetch.

## 2021-07-01 NOTE — Assessment & Plan Note (Signed)
Takes Albuterol inhaler,Flovent inhaler. She has been using  albuterol inhaler twice daily, flovent inhaler BID.  Reports that she wheezes a lot with SOB and that where she works has a  lot of mist and this makes her asthma flare up.   Discontinue Flovent inhaler, start Symbicort, use 2 puffs 2 times daily.  Continue albuterol as needed.

## 2021-07-01 NOTE — Assessment & Plan Note (Signed)
Lipid panel ordered by Dr. Harvel Ricks atorvastatin 10 mg daily Lab Results  Component Value Date   CHOL 107 07/30/2020   HDL 53 07/30/2020   LDLCALC 37 07/30/2020   TRIG 89 07/30/2020   CHOLHDL 2.0 07/30/2020

## 2021-07-01 NOTE — Assessment & Plan Note (Signed)
TSH ordered by Dr. Dorris Fetch.  Patient will have labs done this week.  Takes Synthroid 75 mcg daily.

## 2021-07-01 NOTE — Assessment & Plan Note (Signed)
Takes Linzess 290 mcg daily. Followed by GI

## 2021-07-01 NOTE — Progress Notes (Signed)
New Patient Office Visit  Subjective:  Patient ID: Kathryn [name], female    DOB: [birth year]  Age: 51 y.o. MRN: [id number]  CC:  Chief Complaint  Patient presents with   New Patient (Initial Visit)    NP     HPI Kathryn [name] presents to establish care. Previous PCP Dr. [name].   Asthma. Albuterol inhaler PRN. Flovent inhaler. She has been using  albuterol inhaler twice daily. Reports that she wheezes a lot with SOB and that where she works has a  lot of mist and this makes her asthma flare up.  HLD. Atorvastatin 10m. Psy meds. Congetin 126m klonazepam 0.22m71mid prn, cymbalta 59m78mD, RISPERIDONE 2mg 17ms prescribed by Dr. DebraMargurite Auerbachreports that she sees the practitioner every 3 months.  Vitamin d deff. Vitamin d 10000 units daily. HTN. Cavedilol 12.22mg B422m lorsartan 50mg d73m T2D. Followed by Dr [name]. EDorris Fetchtide 2 mg injection, glipizide 22mg dai74m toujeo 80mg dai36mshe last saw Dr [name], in [name] 2022, pt stated that she will have labs ordered by Dr [name] doneDorris Fetchthis week Saturday and she will call them to schedule a follow up appointment. Pt told to call this office as well to notify me that blood work has been done.  Diabetic Neuropathy, she is out of gabapentin 300mg dail14mHypothyroidism. Synthroid 722mcg. She322mout of med.  IBS with Constipation. Linzess 290mcg, foll22m by Dr Ramon. She hShonna Chocken out of bentyl , she will call GI to know if med is still indicated.  GERD. Protonix 40mg BID.  I11mnia. Trazodone 100mg .    Pat74m will get shingles and flu vaccines today.   Patient stated that she had her Pap smear done [facility] last year, she stated that the result was normal, patient stated that she had her diabetic eye exam done last year June. Pt will authorize us to get her [place] results.     Past Medical History:  Diagnosis Date   Anemia    Anxiety    Asthma    Depression    GERD (gastroesophageal reflux disease)    HLD  (hyperlipidemia) 04/07/2019   Hypertension    Hypothyroidism, adult 04/07/2019   Neuropathy    Obesity (BMI 30.0-34.9) 04/07/2019   Schizoaffective disorder (HCC)    Type IShackelfordiabetes mellitus, uncontrolled 04/27/2019    Past Surgical History:  Procedure Laterality Date   BACK SURGERY  09/06/2020   BIOPSY  05/03/2021   Procedure: BIOPSY;  Surgeon: Castaneda MayoHarvel Qualen: [facility];  Service: Gastroenterology;;   BREAST SURGERY Right    biopsy   CESAREAN SECTION     CHOLECYSTECTOMY  06/02/2012   Procedure: LAPAROSCOPIC CHOLECYSTECTOMY;  Surgeon: [name] Son: [facility];  Service: General;  Laterality: N/A;  Attempted laparoscopic cholecystectomy   CHOLECYSTECTOMY  06/02/2012   Procedure: CHOLECYSTECTOMY;  Surgeon: [name] Son: [facility];  Service: General;  Laterality: N/A;  converted to open at  0905   [id number] WITH PROPOFOL N/A 07/18/2020   prep was fair, one 4mm polyp (inf68mmatory) stool in descending colon, transverse and ascending, distal rectum and anal verge normal   ESOPHAGEAL DILATION  12/31/2018   Procedure: ESOPHAGEAL DILATION;  Surgeon: [name], [name]: [facility];  Service: Endoscopy;;   ESOPHAGOGASTRODUODENOSCOPY (EGD) WITH PROPOFOL N/A 08/24/2015   Procedure: ESOPHAGOGASTRODUODENOSCOPY (EGD) WITH PROPOFOL;  Surgeon: Najeeb U RehmanRogene Houston: [facility];  Service: Endoscopy;  Laterality: N/A;  1:10 - Ann to notify pt to arrive at 11:30   ESOPHAGOGASTRODUODENOSCOPY (EGD) WITH PROPOFOL N/A 12/31/2018   normal, no abnormality to explain dysphagia, esophagus dilated.   ESOPHAGOGASTRODUODENOSCOPY (EGD) WITH PROPOFOL N/A 05/03/2021   Procedure: ESOPHAGOGASTRODUODENOSCOPY (EGD) WITH PROPOFOL;  Surgeon: [name], MD;  [facility];  Service: Gastroenterology;  Laterality: N/A;  1:20   FLEXIBLE SIGMOIDOSCOPY  07/17/2020   Procedure: FLEXIBLE SIGMOIDOSCOPY;   Surgeon: [name], [name], MD;  [facility];  Service: Gastroenterology;;   POLYPECTOMY  07/18/2020   Procedure: POLYPECTOMY INTESTINAL;  Surgeon: [name], [name], MD;  [facility];  Service: Gastroenterology;;  ascending colon polyp;    SAVORY DILATION  05/03/2021   Procedure: SAVORY DILATION;  Surgeon: [name], MD;  [facility];  Service: Gastroenterology;;   tooth removal  2019   all teeth removed   TUBAL LIGATION     X3    Family History  Problem Relation Age of Onset   Hypertension Mother    Alcohol abuse Mother    Heart disease Mother    Kidney disease Mother    Hypertension Father    Alcohol abuse Sister    Stroke Other    Diabetes Other    Cancer Other    Seizures Other    Alcohol abuse Maternal Aunt    Alcohol abuse Paternal Aunt    Alcohol abuse Maternal Grandfather    Alcohol abuse Maternal Grandmother    Alcohol abuse Cousin    Hearing loss Daughter     Social History   Socioeconomic History   Marital status: Married    Spouse name: Not on file   Number of children: Not on file   Years of education: Not on file   Highest education level: Not on file  Occupational History   Not on file  Tobacco Use   Smoking status: Never   Smokeless tobacco: Never  Vaping Use   Vaping Use: Never used  Substance and Sexual Activity   Alcohol use: No    Alcohol/week: 0.0 standard drinks   Drug use: No   Sexual activity: Not Currently    Birth control/protection: Surgical  Other Topics Concern   Not on file  Social History Narrative   Married for 22 years.On disability secondary to schizophrenia.Lives with husband.   Social Determinants of Health   Financial Resource Strain: Not on file  Food Insecurity: Not on file  Transportation Needs: Not on file  Physical Activity: Not on file  Stress: Not on file  Social Connections: Not on file  Intimate Partner  Violence: Not on file    ROS Review of Systems  Constitutional: Negative.   Respiratory:  Positive for shortness of breath and wheezing.   Cardiovascular: Negative.   Gastrointestinal: Negative.   Neurological:        Reports tingling and numbness of both hands and feet, well controlled with gabapentin.   Psychiatric/Behavioral: Negative.     Objective:   Today's Vitals: BP 132/88 (BP Location: Right Arm, Patient Position: Sitting, Cuff Size: Normal)    Pulse 99    Ht 5' (1.524 m)    Wt 154 lb (69.9 kg)    LMP 01/23/2013    BMI 30.08 kg/m   Physical Exam Constitutional:      General: She is not in acute distress.    Appearance: Normal appearance. She is not ill-appearing, toxic-appearing or diaphoretic.  Cardiovascular:     Rate and Rhythm: Normal rate and regular rhythm.     Heart sounds: No murmur heard.   No friction rub.  Pulmonary:     Effort: Pulmonary effort is normal. No respiratory distress.     Breath sounds: Normal breath sounds. No stridor. No wheezing, rhonchi or rales.  Chest:     Chest wall: No tenderness.  Abdominal:     Palpations: Abdomen is soft.  Neurological:     Mental Status: She is alert.  Psychiatric:        Mood and Affect: Mood normal.        Behavior: Behavior normal.        Thought Content: Thought content normal.        Judgment: Judgment normal.    Assessment & Plan:   Problem List Items Addressed This Visit   None   Outpatient Encounter Medications as of 07/01/2021  Medication Sig   albuterol (VENTOLIN HFA) 108 (90 Base) MCG/ACT inhaler Inhale 2 puffs into the lungs every 6 (six) hours as needed for wheezing or shortness of breath.   aspirin EC 81 MG tablet Take 81 mg by mouth daily.   atorvastatin (LIPITOR) 10 MG tablet Take 1 tablet (10 mg total) by mouth at bedtime.   benztropine (COGENTIN) 1 MG tablet TAKE 1 TABLET(1 MG) BY MOUTH AT BEDTIME   Blood Glucose Monitoring Suppl (ACCU-CHEK GUIDE ME) w/Device KIT 1 Piece by Does  not apply route as directed.   carvedilol (COREG) 12.5 MG tablet Take 1 tablet (12.5 mg total) by mouth 2 (two) times daily.   clonazePAM (KLONOPIN) 0.5 MG tablet Take 1 tablet (0.5 mg total) by mouth 2 (two) times daily as needed for anxiety.   DULoxetine (CYMBALTA) 60 MG capsule Take 1 capsule (60 mg total) by mouth 2 (two) times daily.   Exenatide ER (BYDUREON BCISE) 2 MG/0.85ML AUIJ Inject 2 mg into the skin every Monday.   FLOVENT HFA 44 MCG/ACT inhaler Inhale 2 puffs into the lungs in the morning and at bedtime.   gabapentin (NEURONTIN) 300 MG capsule Take 1 capsule (300 mg total) by mouth daily.   glipiZIDE (GLUCOTROL XL) 5 MG 24 hr tablet Take 1 tablet (5 mg total) by mouth daily with breakfast.   glucose blood (ACCU-CHEK GUIDE) test strip Use as instructed   insulin glargine, 2 Unit Dial, (TOUJEO MAX SOLOSTAR) 300 UNIT/ML Solostar Pen Inject 80 Units into the skin daily.   levothyroxine (SYNTHROID) 75 MCG tablet Take 1 tablet (75 mcg total) by mouth daily.   linaclotide (LINZESS) 290 MCG CAPS capsule Take 1 capsule (290 mcg total) by mouth daily before breakfast.   losartan (COZAAR) 50 MG tablet Take 1 tablet (50 mg total) by mouth daily.   ondansetron (ZOFRAN) 4 MG tablet Take 1 tablet (4 mg total) by mouth every 6 (six) hours. as needed for nausea or vomiting. (Patient taking differently: Take 4 mg by mouth every 6 (six) hours as needed for nausea or vomiting. as needed for nausea or vomiting.)   pantoprazole (PROTONIX) 40 MG tablet Take 1 tablet (40 mg total) by mouth 2 (two) times daily.   Polyvinyl Alcohol-Povidone PF (REFRESH) 1.4-0.6 % SOLN Place 1 drop into both eyes daily as needed (dry eyes).   promethazine (PHENERGAN) 25 MG tablet Take 25 mg by mouth every 6 (six) hours as needed for nausea or vomiting.    risperiDONE (RISPERDAL) 2 MG tablet TAKE 1 TABLET EVERY MORNING AND 3 TABLETS AT  BEDTIME   traZODone (DESYREL) 100 MG tablet TAKE 2 TABLETS(200 MG) BY MOUTH  AT BEDTIME   Cholecalciferol (VITAMIN D-3) 125 MCG (5000 UT) TABS Take 2 tablets by mouth daily.   dicyclomine (BENTYL) 10 MG capsule Take 10 mg by mouth daily. (Patient not taking: Reported on 07/01/2021)   HYDROcodone-acetaminophen (NORCO/VICODIN) 5-325 MG tablet Take 1 tablet by mouth every 6 (six) hours as needed for moderate pain. (Patient not taking: Reported on 07/01/2021)   No facility-administered encounter medications on file as of 07/01/2021.    Follow-up: No follow-ups on file.   Renee Rival, FNP

## 2021-07-01 NOTE — Assessment & Plan Note (Addendum)
Followed by Dr. Harrington Challenger.  Patient reports that she follows up with Dr. Every 3 months,  Takes risperidone 2 mg daily, Cogentin 1 mg daily. Denies SI, HI

## 2021-07-01 NOTE — Assessment & Plan Note (Signed)
Refilled gabapentin 300 mg daily. Reports tingling and numbness of both hands and feet, states symptoms are well controlled with gabapentin.

## 2021-07-01 NOTE — Addendum Note (Signed)
Addended by: Jill Side on: 07/01/2021 10:41 AM   Modules accepted: Orders

## 2021-07-01 NOTE — Assessment & Plan Note (Signed)
Followed by GI.  Takes Linzess 290 mcg daily.

## 2021-07-01 NOTE — Assessment & Plan Note (Signed)
Takes vitamin D 10,000 units daily. Vitamin D labs ordered by Dr. Dorris Fetch Patient will have labs done this week.

## 2021-07-01 NOTE — Assessment & Plan Note (Signed)
Takes Protonix 40 mg 2 times daily. Followed by GI

## 2021-07-01 NOTE — Patient Instructions (Signed)
Please get your covid vaccine at your pharmacy Get your shingles and flu vaccine today.  It is important that you exercise regularly at least 30 minutes 5 times a week.  Think about what you will eat, plan ahead. Choose " clean, green, fresh or frozen" over canned, processed or packaged foods which are more sugary, salty and fatty. 70 to 75% of food eaten should be vegetables and fruit. Three meals at set times with snacks allowed between meals, but they must be fruit or vegetables. Aim to eat over a 12 hour period , example 7 am to 7 pm, and STOP after  your last meal of the day. Drink water,generally about 64 ounces per day, no other drink is as healthy. Fruit juice is best enjoyed in a healthy way, by EATING the fruit.  Thanks for choosing [facility], we consider it a privelige to serve you.

## 2021-07-17 ENCOUNTER — Emergency Department (HOSPITAL_COMMUNITY)
Admission: EM | Admit: 2021-07-17 | Discharge: 2021-07-17 | Disposition: A | Discharge status: Home / Self Care | Payer: BC Managed Care – PPO | Attending: Emergency Medicine | Admitting: Emergency Medicine

## 2021-07-17 ENCOUNTER — Other Ambulatory Visit: Payer: Self-pay

## 2021-07-17 DIAGNOSIS — E876 Hypokalemia: Secondary | ICD-10-CM | POA: Insufficient documentation

## 2021-07-17 DIAGNOSIS — Z794 Long term (current) use of insulin: Secondary | ICD-10-CM | POA: Diagnosis not present

## 2021-07-17 DIAGNOSIS — I10 Essential (primary) hypertension: Secondary | ICD-10-CM | POA: Diagnosis not present

## 2021-07-17 DIAGNOSIS — E1165 Type 2 diabetes mellitus with hyperglycemia: Secondary | ICD-10-CM | POA: Insufficient documentation

## 2021-07-17 DIAGNOSIS — Z7982 Long term (current) use of aspirin: Secondary | ICD-10-CM | POA: Diagnosis not present

## 2021-07-17 DIAGNOSIS — Z79899 Other long term (current) drug therapy: Secondary | ICD-10-CM | POA: Diagnosis not present

## 2021-07-17 DIAGNOSIS — R7989 Other specified abnormal findings of blood chemistry: Secondary | ICD-10-CM | POA: Diagnosis not present

## 2021-07-17 DIAGNOSIS — E871 Hypo-osmolality and hyponatremia: Secondary | ICD-10-CM | POA: Insufficient documentation

## 2021-07-17 DIAGNOSIS — G629 Polyneuropathy, unspecified: Secondary | ICD-10-CM | POA: Insufficient documentation

## 2021-07-17 DIAGNOSIS — R739 Hyperglycemia, unspecified: Secondary | ICD-10-CM

## 2021-07-17 LAB — COMPREHENSIVE METABOLIC PANEL
ALT: 13 U/L (ref 0–44)
AST: 11 U/L — ABNORMAL LOW (ref 15–41)
Albumin: 4.2 g/dL (ref 3.5–5.0)
Alkaline Phosphatase: 86 U/L (ref 38–126)
Anion gap: 7 (ref 5–15)
BUN: 8 mg/dL (ref 6–20)
CO2: 26 mmol/L (ref 22–32)
Calcium: 8.8 mg/dL — ABNORMAL LOW (ref 8.9–10.3)
Chloride: 94 mmol/L — ABNORMAL LOW (ref 98–111)
Creatinine, Ser: 0.77 mg/dL (ref 0.44–1.00)
GFR, Estimated: >60 mL/min (ref >60)
Glucose, Bld: 390 mg/dL — ABNORMAL HIGH (ref 70–99)
Potassium: 3.1 mmol/L — ABNORMAL LOW (ref 3.5–5.1)
Sodium: 127 mmol/L — ABNORMAL LOW (ref 135–145)
Total Bilirubin: 1.5 mg/dL — ABNORMAL HIGH (ref 0.3–1.2)
Total Protein: 8.1 g/dL (ref 6.5–8.1)

## 2021-07-17 LAB — CBC WITH DIFFERENTIAL/PLATELET
Abs Immature Granulocytes: 0.02 10*3/uL (ref 0.00–0.07)
Basophils Absolute: 0 10*3/uL (ref 0.0–0.1)
Basophils Relative: 1 %
Eosinophils Absolute: 0.1 10*3/uL (ref 0.0–0.5)
Eosinophils Relative: 1 %
HCT: 37.1 % (ref 36.0–46.0)
Hemoglobin: 12.9 g/dL (ref 12.0–15.0)
Immature Granulocytes: 0 %
Lymphocytes Relative: 46 %
Lymphs Abs: 3.2 10*3/uL (ref 0.7–4.0)
MCH: 29.1 pg (ref 26.0–34.0)
MCHC: 34.8 g/dL (ref 30.0–36.0)
MCV: 83.7 fL (ref 80.0–100.0)
Monocytes Absolute: 0.4 10*3/uL (ref 0.1–1.0)
Monocytes Relative: 6 %
Neutro Abs: 3.2 10*3/uL (ref 1.7–7.7)
Neutrophils Relative %: 46 %
Platelets: 275 10*3/uL (ref 150–400)
RBC: 4.43 MIL/uL (ref 3.87–5.11)
RDW: 11.8 % (ref 11.5–15.5)
WBC: 6.9 10*3/uL (ref 4.0–10.5)
nRBC: 0 % (ref 0.0–0.2)

## 2021-07-17 LAB — URINALYSIS, ROUTINE W REFLEX MICROSCOPIC
Bilirubin Urine: NEGATIVE
Glucose, UA: >=500 mg/dL — AB
Hgb urine dipstick: NEGATIVE
Ketones, ur: 20 mg/dL — AB
Nitrite: NEGATIVE
Protein, ur: NEGATIVE mg/dL
Specific Gravity, Urine: 1.037 — ABNORMAL HIGH (ref 1.005–1.030)
pH: 5 (ref 5.0–8.0)

## 2021-07-17 LAB — BLOOD GAS, VENOUS
Acid-Base Excess: 5.3 mmol/L — ABNORMAL HIGH (ref 0.0–2.0)
Bicarbonate: 31.5 mmol/L — ABNORMAL HIGH (ref 20.0–28.0)
Drawn by: 5678
FIO2: 21 %
O2 Saturation: 45 %
Patient temperature: 36.6
pCO2, Ven: 51 mmHg (ref 44–60)
pH, Ven: 7.4 (ref 7.25–7.43)
pO2, Ven: <31 mmHg — CL (ref 32–45)

## 2021-07-17 LAB — CBG MONITORING, ED
Glucose-Capillary: 411 mg/dL — ABNORMAL HIGH (ref 70–99)
Glucose-Capillary: 300 mg/dL — ABNORMAL HIGH (ref 70–99)

## 2021-07-17 LAB — LIPASE, BLOOD: Lipase: 22 U/L (ref 11–51)

## 2021-07-17 MED ORDER — TOUJEO MAX SOLOSTAR 300 UNIT/ML ~~LOC~~ SOPN: 80.0000 [IU] | PEN_INJECTOR | Freq: Every day | SUBCUTANEOUS | 0 refills | Status: DC

## 2021-07-17 MED ORDER — INSULIN ASPART 100 UNIT/ML IJ SOLN
5.0000 [IU] | Freq: Once | INTRAMUSCULAR | Status: AC
  Administered 2021-07-17: 5 [IU] via SUBCUTANEOUS
  Filled 2021-07-17: qty 1

## 2021-07-17 MED ORDER — CARVEDILOL 12.5 MG PO TABS
12.5000 mg | ORAL_TABLET | Freq: Once | ORAL | Status: AC
  Administered 2021-07-17: 12.5 mg via ORAL
  Filled 2021-07-17: qty 1

## 2021-07-17 MED ORDER — SODIUM CHLORIDE 0.9 % IV BOLUS
1000.0000 mL | Freq: Once | INTRAVENOUS | Status: AC
  Administered 2021-07-17: 1000 mL via INTRAVENOUS

## 2021-07-17 MED ORDER — CARVEDILOL 12.5 MG PO TABS: 12.5000 mg | ORAL_TABLET | Freq: Once | ORAL | Status: DC

## 2021-07-17 MED ORDER — POTASSIUM CHLORIDE CRYS ER 20 MEQ PO TBCR
40.0000 meq | EXTENDED_RELEASE_TABLET | Freq: Once | ORAL | Status: AC
Start: 2021-07-17 — End: 2021-07-17
  Administered 2021-07-17: 40 meq via ORAL
  Filled 2021-07-17: qty 2

## 2021-07-17 MED ORDER — LOSARTAN POTASSIUM 25 MG PO TABS
50.0000 mg | ORAL_TABLET | Freq: Once | ORAL | Status: AC
  Administered 2021-07-17: 50 mg via ORAL
  Filled 2021-07-17: qty 2

## 2021-07-17 MED ORDER — MAGNESIUM OXIDE -MG SUPPLEMENT 400 (240 MG) MG PO TABS
400.0000 mg | ORAL_TABLET | Freq: Once | ORAL | Status: AC
  Administered 2021-07-17: 400 mg via ORAL
  Filled 2021-07-17: qty 1

## 2021-07-17 MED ORDER — CARVEDILOL 12.5 MG PO TABS
12.5000 mg | ORAL_TABLET | Freq: Two times a day (BID) | ORAL | Status: DC
  Filled 2021-07-17: qty 1

## 2021-07-17 NOTE — ED Provider Notes (Signed)
[facility] Provider Note   CSN: [id number] Arrival date & time: 07/17/21  1236     History  Chief Complaint  Patient presents with   Hyperglycemia   Hypertension    Kathryn [name] is a 51 y.o. female.  HPI  Patient with medical history including hypertension, schizoaffective disorder, type 2 diabetes presents with chief complaint of elevated blood sugar as well as hypertension.  Patient states that she was at her dentist office today and they noted that her blood sugar was elevated, the also that her blood pressure was also high and sent here for further evaluation.  She states that she has been without her insulin for the last 3 days, and she states that she has had increase thirst and urination, as well as some loose stools, denies any stomach pains, nausea vomiting.  She states that she has been compliant with her blood pressure medications, does not endorse any headaches, change in vision, paresthesia or weakness upper lower extremities.  Patient has no other complaints.  She has no URI-like symptoms, no fevers or chills.  Reviewed patient's charts patient takes carvedilol for blood pressure, as well as low-dose losartan, if her diabetes she takes glipizide as well as glargine.   Home Medications Prior to Admission medications   Medication Sig Start Date End Date Taking? Authorizing Provider  albuterol (VENTOLIN HFA) 108 (90 Base) MCG/ACT inhaler Inhale 2 puffs into the lungs every 6 (six) hours as needed for wheezing or shortness of breath. 01/09/21   Ailene Ards, NP  aspirin EC 81 MG tablet Take 81 mg by mouth daily.    [provider]  atorvastatin (LIPITOR) 10 MG tablet Take 1 tablet (10 mg total) by mouth at bedtime. 01/09/21   Ailene Ards, NP  benztropine (COGENTIN) 1 MG tablet TAKE 1 TABLET(1 MG) BY MOUTH AT BEDTIME 06/19/21   Cloria Spring, MD  Blood Glucose Monitoring Suppl (ACCU-CHEK GUIDE ME) w/Device KIT 1 Piece by Does not apply route  as directed. 08/02/20   Cassandria Anger, MD  budesonide-formoterol (SYMBICORT) 80-4.5 MCG/ACT inhaler Inhale 2 puffs into the lungs 2 (two) times daily. 07/01/21   Renee Rival, FNP  carvedilol (COREG) 12.5 MG tablet Take 1 tablet (12.5 mg total) by mouth 2 (two) times daily. 01/09/21   Ailene Ards, NP  Cholecalciferol (VITAMIN D-3) 125 MCG (5000 UT) TABS Take 2 tablets by mouth daily. 10/13/19   Cassandria Anger, MD  clonazePAM (KLONOPIN) 0.5 MG tablet Take 1 tablet (0.5 mg total) by mouth 2 (two) times daily as needed for anxiety. 06/19/21 06/19/22  Cloria Spring, MD  dicyclomine (BENTYL) 10 MG capsule Take 10 mg by mouth daily. Patient not taking: Reported on 07/01/2021    [provider]  DULoxetine (CYMBALTA) 60 MG capsule Take 1 capsule (60 mg total) by mouth 2 (two) times daily. 06/19/21   Cloria Spring, MD  Exenatide ER (BYDUREON BCISE) 2 MG/0.85ML AUIJ Inject 2 mg into the skin every Monday.    [provider]  gabapentin (NEURONTIN) 300 MG capsule Take 1 capsule (300 mg total) by mouth daily. 07/01/21   Renee Rival, FNP  glipiZIDE (GLUCOTROL XL) 5 MG 24 hr tablet Take 1 tablet (5 mg total) by mouth daily with breakfast. 01/09/21   Ailene Ards, NP  glucose blood (ACCU-CHEK GUIDE) test strip Use as instructed 08/02/20   Cassandria Anger, MD  insulin glargine, 2 Unit Dial, (TOUJEO MAX SOLOSTAR)  300 UNIT/ML Solostar Pen Inject 80 Units into the skin daily. 10/13/19   Cassandria Anger, MD  levothyroxine (SYNTHROID) 75 MCG tablet Take 1 tablet (75 mcg total) by mouth daily. 07/01/21   Renee Rival, FNP  linaclotide (LINZESS) 290 MCG CAPS capsule Take 1 capsule (290 mcg total) by mouth daily before breakfast. 04/23/21   Carlan, Chelsea L, NP  losartan (COZAAR) 50 MG tablet Take 1 tablet (50 mg total) by mouth daily. 01/09/21   Ailene Ards, NP  pantoprazole (PROTONIX) 40 MG tablet Take 1 tablet (40 mg total) by mouth 2 (two) times daily. 05/03/21    [name], MD  Polyvinyl Alcohol-Povidone PF (REFRESH) 1.4-0.6 % SOLN Place 1 drop into both eyes daily as needed (dry eyes).    [provider]  risperiDONE (RISPERDAL) 2 MG tablet TAKE 1 TABLET EVERY MORNING AND 3 TABLETS AT BEDTIME 06/19/21   Cloria Spring, MD  traZODone (DESYREL) 100 MG tablet TAKE 2 TABLETS(200 MG) BY MOUTH AT BEDTIME 06/19/21   Cloria Spring, MD      Allergies    Patient has no known allergies.    Review of Systems   Review of Systems  Constitutional:  Negative for chills and fever.  Respiratory:  Negative for shortness of breath.   Cardiovascular:  Negative for chest pain.  Gastrointestinal:  Negative for abdominal pain, diarrhea, nausea and vomiting.  Neurological:  Negative for headaches.   Physical Exam Updated Vital Signs BP (!) 147/89 (BP Location: Right Arm)    Pulse 99    Temp 97.9 F (36.6 C) (Oral)    Resp 20    LMP 01/23/2013    SpO2 99%  Physical Exam Vitals and nursing note reviewed.  Constitutional:      General: She is not in acute distress.    Appearance: She is not ill-appearing.  HENT:     Head: Normocephalic and atraumatic.     Nose: No congestion.     Mouth/Throat:     Mouth: Mucous membranes are moist.     Pharynx: Oropharynx is clear.  Eyes:     Extraocular Movements: Extraocular movements intact.     Conjunctiva/sclera: Conjunctivae normal.     Pupils: Pupils are equal, round, and reactive to light.  Cardiovascular:     Rate and Rhythm: Normal rate and regular rhythm.     Pulses: Normal pulses.     Heart sounds: No murmur heard.   No friction rub. No gallop.  Pulmonary:     Effort: No respiratory distress.     Breath sounds: No wheezing, rhonchi or rales.  Abdominal:     Palpations: Abdomen is soft.     Tenderness: There is no abdominal tenderness. There is no right CVA tenderness or left CVA tenderness.  Musculoskeletal:     Right lower leg: No edema.     Left lower leg: No edema.  Skin:     General: Skin is warm and dry.  Neurological:     Mental Status: She is alert.     Cranial Nerves: No cranial nerve deficit or facial asymmetry.     Sensory: Sensation is intact.     Motor: No weakness.     Coordination: Coordination is intact. Finger-Nose-Finger Test and Heel to [place] Test normal.     Gait: Gait is intact.     Comments: Current nerves II through XII grossly intact, no difficulty with word finding, able to follow two-step commands, no unilateral weakness present.  Gait fully intact.  Patient does have some decrease in visual field on the right side in the temporal aspect, patient states this is normal for her as she is has multiple issues with that right eye.  Psychiatric:        Mood and Affect: Mood normal.    ED Results / Procedures / Treatments   Labs (all labs ordered are listed, but only abnormal results are displayed) Labs Reviewed  CBG MONITORING, ED - Abnormal; Notable for the following components:      Result Value   Glucose-Capillary 411 (*)    All other components within normal limits  COMPREHENSIVE METABOLIC PANEL  LIPASE, BLOOD  CBC WITH DIFFERENTIAL/PLATELET  URINALYSIS, ROUTINE W REFLEX MICROSCOPIC  BLOOD GAS, VENOUS    EKG None  Radiology No results found.  Procedures Procedures    Medications Ordered in ED Medications  sodium chloride 0.9 % bolus 1,000 mL (has no administration in time range)    ED Course/ Medical Decision Making/ A&P                           Medical Decision Making Amount and/or Complexity of Data Reviewed Labs: ordered.  Risk OTC drugs. Prescription drug management.   This patient presents to the ED for concern of hyperglycemia and high blood pressure, this involves an extensive number of treatment options, and is a complaint that carries with it a high risk of complications and morbidity.  The differential diagnosis includes DKA, HSS, metabolic abnormality, sepsis, CVA    Additional history  obtained:  Additional history obtained from electronic medical record External records from outside source obtained and reviewed including please see HPI for further detail   Co morbidities that complicate the patient evaluation  Hypertension, diabetes  Social Determinants of Health:  Schizoaffective disorder    Lab Tests:  I Ordered, and personally interpreted labs.  The pertinent results include: CBC unremarkable, CMP shows sodium of 127, potassium of 3.1, chloride 94, glucose 390 calcium 8.8, T. bili 1.5, venous blood gas shows bicarb 31, lipase 22, repeat CBG 300, UA shows leukocytes and rare bacteria.   Imaging Studies ordered:  I ordered imaging studies including N/A    Cardiac Monitoring:  The patient was maintained on a cardiac monitor.  I personally viewed and interpreted the cardiac monitored which showed an underlying rhythm of: N/A   Medicines ordered and prescription drug management:  I ordered medication including Coreg for hypertension I have reviewed the patients home medicines and have made adjustments as needed     Reevaluation:  On arrival patient presents with hypertension and hyperlipasemia, she is provided with Coreg as well as fluids, patient is reassessed found resting comfortably, has no complaints, noted that her blood pressure still slightly elevated, will provide her her with her losartan reassessed.  Also note that her potassium is slightly low, will provide her with oral potassium as well as magnesium.  Patient is reassessed found resting comfortably, has no complaints, BP is downtrending, glucose is also downtrending, she is agreeable for discharge at this time.   Test Considered:  We will defer on chest x-ray as lung sounds are clear bilaterally, no leukocytosis, no URI-like symptoms.    Rule out Low suspicion for DKA or HHS as glucose is not significant elevated, she is not acidotic seen on VBG, no anion gap, no decrease in her  CO2.  I have low suspicion for pancreatitis as she has no epigastric  tenderness, lipase within normal limits.  I have low suspicion for UTI [place] kidney stone denies any urinary symptoms, UA is negative for signs of infection or hematuria.  I have low suspicion for hypertensive urgency or emergency as there is no signs of organ damage present within lab work or on my exam.  Low suspicion for CVA or intracranial head bleed patient has recent head trauma, not on anticoag, there is no focal deficit present my exam.  It is noted that patient has hyponatremia but this is like secondary due to elevated glucose, with it corrected sodium is normal at 132.    Dispostion and problem list  After consideration of the diagnostic results and the patients response to treatment, I feel that the patent would benefit from discharge.  Hyperglycemia-resolved likely secondary due to missed medications, will refill her medications follow-up with PCP for further evaluation. 2.  Hypertension-likely transient, patient was given her home medications, will have her continue to follow this, if elevated she will have her follow-up with PCP.            Final Clinical Impression(s) / ED Diagnoses Final diagnoses:  None    Rx / DC Orders ED Discharge Orders     None         Marcello Fennel, PA-C 07/17/21 1706    Elnora Morrison, MD 07/21/21 2354

## 2021-07-17 NOTE — ED Triage Notes (Signed)
Pt went to dentist today and was told that he BP was elevated and her BS was 547. Pt reports she has been out of her insulin since Monday and does not have an appt with her PCP until March.

## 2021-07-17 NOTE — Discharge Instructions (Signed)
Hyperglycemia-I have refilled your long-acting insulin glargine please take as prescribed, also please continue taking your glipizide.  Follow-up with your PCP as needed. Hypertension-given you your daily dose of your hypertension medications please do not take any more today.  Please continue with them as scheduled tomorrow.  I would like you to keep track of it has a slight elevated today if it remains elevated please follow-up with your PCP.  Come back to the emergency department if you develop chest pain, shortness of breath, severe abdominal pain, uncontrolled nausea, vomiting, diarrhea.

## 2021-07-17 NOTE — ED Notes (Signed)
pO2  resulted reported to [employer], [place]

## 2021-07-19 ENCOUNTER — Ambulatory Visit: Payer: BC Managed Care – PPO | Admitting: Nurse Practitioner

## 2021-07-22 ENCOUNTER — Other Ambulatory Visit (INDEPENDENT_AMBULATORY_CARE_PROVIDER_SITE_OTHER): Payer: Self-pay | Admitting: Nurse Practitioner

## 2021-07-22 DIAGNOSIS — E782 Mixed hyperlipidemia: Secondary | ICD-10-CM

## 2021-07-22 DIAGNOSIS — I10 Essential (primary) hypertension: Secondary | ICD-10-CM

## 2021-07-22 DIAGNOSIS — E1165 Type 2 diabetes mellitus with hyperglycemia: Secondary | ICD-10-CM

## 2021-07-22 DIAGNOSIS — E039 Hypothyroidism, unspecified: Secondary | ICD-10-CM

## 2021-07-22 DIAGNOSIS — E559 Vitamin D deficiency, unspecified: Secondary | ICD-10-CM

## 2021-07-25 ENCOUNTER — Ambulatory Visit: Payer: BC Managed Care – PPO | Admitting: Nurse Practitioner

## 2021-07-27 ENCOUNTER — Other Ambulatory Visit: Payer: Self-pay | Admitting: Nurse Practitioner

## 2021-07-27 ENCOUNTER — Other Ambulatory Visit (INDEPENDENT_AMBULATORY_CARE_PROVIDER_SITE_OTHER): Payer: Self-pay | Admitting: Nurse Practitioner

## 2021-07-27 DIAGNOSIS — I10 Essential (primary) hypertension: Secondary | ICD-10-CM

## 2021-07-27 DIAGNOSIS — E039 Hypothyroidism, unspecified: Secondary | ICD-10-CM

## 2021-07-27 DIAGNOSIS — E1143 Type 2 diabetes mellitus with diabetic autonomic (poly)neuropathy: Secondary | ICD-10-CM

## 2021-07-27 DIAGNOSIS — E1165 Type 2 diabetes mellitus with hyperglycemia: Secondary | ICD-10-CM

## 2021-07-27 DIAGNOSIS — E782 Mixed hyperlipidemia: Secondary | ICD-10-CM

## 2021-07-27 DIAGNOSIS — E559 Vitamin D deficiency, unspecified: Secondary | ICD-10-CM

## 2021-07-30 ENCOUNTER — Ambulatory Visit (INDEPENDENT_AMBULATORY_CARE_PROVIDER_SITE_OTHER): Payer: BC Managed Care – PPO | Admitting: Nurse Practitioner

## 2021-07-30 ENCOUNTER — Encounter: Payer: Self-pay | Admitting: Nurse Practitioner

## 2021-07-30 ENCOUNTER — Other Ambulatory Visit: Payer: Self-pay

## 2021-07-30 VITALS — BP 130/92 | HR 83 | Ht 60.0 in | Wt 154.0 lb

## 2021-07-30 DIAGNOSIS — Z09 Encounter for follow-up examination after completed treatment for conditions other than malignant neoplasm: Secondary | ICD-10-CM

## 2021-07-30 DIAGNOSIS — I10 Essential (primary) hypertension: Secondary | ICD-10-CM | POA: Diagnosis not present

## 2021-07-30 DIAGNOSIS — E782 Mixed hyperlipidemia: Secondary | ICD-10-CM

## 2021-07-30 DIAGNOSIS — E559 Vitamin D deficiency, unspecified: Secondary | ICD-10-CM

## 2021-07-30 DIAGNOSIS — E039 Hypothyroidism, unspecified: Secondary | ICD-10-CM | POA: Diagnosis not present

## 2021-07-30 DIAGNOSIS — E1165 Type 2 diabetes mellitus with hyperglycemia: Secondary | ICD-10-CM | POA: Insufficient documentation

## 2021-07-30 DIAGNOSIS — Z794 Long term (current) use of insulin: Secondary | ICD-10-CM

## 2021-07-30 MED ORDER — LOSARTAN POTASSIUM 100 MG PO TABS: 100.0000 mg | ORAL_TABLET | Freq: Every day | ORAL | 0 refills | Status: DC

## 2021-07-30 MED ORDER — GLIPIZIDE ER 5 MG PO TB24: 5.0000 mg | ORAL_TABLET | Freq: Every day | ORAL | 0 refills | Status: DC

## 2021-07-30 NOTE — Assessment & Plan Note (Signed)
Recent ER visit for high blood pressure and hyperglycemia ?ER notes, labs reviewed by me.  ?Hyperglycemia due to pt missing her insulin glargine, and not getting adequate medication coverage for her T2DM. Toujeo 80 units refilled at the ER. Check A1C today, pt educated on the need to follow up with endo for management of T2DM.  Glipizide '5mg'$   Refilled today. Losartan increased today from '100mg'$  daily from '50mg'$  daily for uncontrolled HTN.  ?

## 2021-07-30 NOTE — Assessment & Plan Note (Signed)
Takes atorvastatin 10 mg daily ?Check lipid panel today. ?

## 2021-07-30 NOTE — Progress Notes (Addendum)
? ?Kathryn [name]     MRN: [id number]      DOB: December 05, 1970 ? ? ?HPI ?Kathryn [name] with medical history of type 2 diabetes hypertension, hypothyroidism, PTSD, mixed hyperlipidemia, vitamin D deficiency is here for follow up for ER visit for HTN and DM.  States that she was seen in the ER on 07/17/2021 due to hypertension and elevated blood sugar. ?Patient states that her CBG readings at home is between 271-300's she has not been taking bydureon 75m injection, she has been out of meds for months. Pt stated that she ran out glipizide a week ago. She has been taking toujeo 80 units daily, med was refilled at the ER during her last visit. ?She sometimes drinks soda sometimes, eating plenty of vegetables.  Patient stated that she has not been seen by endocrinologist, states that she missed her last appointment.  Need to keep all appointment discussed with patient she verbalized understanding.  Patient encouraged to go to Dr. NLiliane Channeloffice today and schedule an appointment to be seen.  ? ?ROS ? ?Denies recent fever or chills. ?Denies sinus pressure, nasal congestion, ear pain or sore throat. ?Denies chest congestion, productive cough or wheezing. ?Denies chest pains, palpitations and leg swelling ?Denies abdominal pain, nausea, vomiting,diarrhea or constipation.   ?Denies dysuria, frequency, hesitancy or incontinence. ?Denies joint pain, swelling and limitation in mobility. ?Denies headaches, seizures, numbness, or tingling. ?Denies depression, anxiety or insomnia. ?Denies skin break down or rash. ? ? ?PE ? ?BP (!) 130/92 (BP Location: Right Arm, Cuff Size: Normal)   Pulse 83   Ht 5' (1.524 m)   Wt 154 lb (69.9 kg)   LMP 01/23/2013   SpO2 97%   BMI 30.08 kg/m?  ? ?Patient alert and oriented and in no cardiopulmonary distress. ? ? ?Chest: Clear to auscultation bilaterally. ? ?CVS: S1, S2 no murmurs, no S3.Regular rate. ? ?ABD: Soft non tender.  ? ?Ext: No edema ? ?MS: Adequate ROM spine, shoulders, hips and  knees. ? ?Skin: Intact, no ulcerations or rash noted. ? ?Psych: Good eye contact, normal affect. Memory intact not anxious or depressed appearing. ? ?CNS: CN 2-12 intact, power,  normal throughout.no focal deficits noted. ? ? ?Assessment & Plan ?Essential hypertension, benign ?BP Readings from Last 3 Encounters:  ?07/30/21 (!) 130/92  ?07/17/21 (!) 168/108  ?07/01/21 132/88  ?Uncontrolled hypertension, takes losartan 50 mg daily, carvedilol 12.5 mg twice daily ?Start taking losartan 100 mg daily, continue carvedilol 12.5 mg twice daily. ?DASH diet advised, engage in regular exercise 30 minutes per day,  5 times weekly ?Check CMP plus EGFR today due to low potassium level in the ED. ?BMP in 2 weeks  ?Follow-up in 4 weeks.  ? ?Hypothyroidism ?Check TSH today  ?Takes levothyroxine 75 mcg tablets daily ? ?Vitamin D deficiency ?Vitamin D labs today . Takes vitamin D10,000 units daily ? ?Mixed hyperlipidemia ?Takes atorvastatin 10 mg daily ?Check lipid panel today. ? ?Encounter for examination following treatment at hospital ?Recent ER visit for high blood pressure and hyperglycemia ?ER notes, labs reviewed by me.  ?Hyperglycemia due to pt missing her insulin glargine, and not getting adequate medication coverage for her T2DM. Toujeo 80 units refilled at the ER. Check A1C today, pt educated on the need to follow up with endo for management of T2DM.  Glipizide 569m Refilled today. Losartan increased today from 10010maily from 66m70mily for uncontrolled HTN.  ? ?Uncontrolled diabetes mellitus with hyperglycemia, with long-term current use of insulin ([facility]  is overdue for visit to endocrinology.  During last visit I told patient to follow-up with endocrinologist but up till now  patient has not called endocrinologist office to schedule an appointment. ?She has been taking insulin gargling 80 units daily, glipizide 5 mg daily.  States that she ran out of glipizide a week ago glipizide refilled today ?Patient told to  continue insulin glargine 80 units daily, glipizide 5 mg daily call Dr. Liliane Channel office to schedule an appointment to be seen. ?A1c ordered today, foot exam completed today. ?  ?Need to keep all follow-up appointments discussed with patient she verbalized understanding ?  ? ?

## 2021-07-30 NOTE — Assessment & Plan Note (Signed)
Check TSH today  ?Takes levothyroxine 75 mcg tablets daily ?

## 2021-07-30 NOTE — Assessment & Plan Note (Signed)
Vitamin D labs today . Takes vitamin D10,000 units daily ?

## 2021-07-30 NOTE — Patient Instructions (Signed)
Start taking losartan '100mg'$  daily for your blood pressure. Check your blood pressure twice daily , write down the numbers and bring to your follow up appointment .  ? ?Please schedule an appointment with Dr Dorris Fetch today as discussed  ? ?Please get your labs done today  ? ?It is important that you exercise regularly at least 30 minutes 5 times a week.  ?Think about what you will eat, plan ahead. ?Choose " clean, green, fresh or frozen" over canned, processed or packaged foods which are more sugary, salty and fatty. ?70 to 75% of food eaten should be vegetables and fruit. ?Three meals at set times with snacks allowed between meals, but they must be fruit or vegetables. ?Aim to eat over a 12 hour period , example 7 am to 7 pm, and STOP after  your last meal of the day. ?Drink water,generally about 64 ounces per day, no other drink is as healthy. Fruit juice is best enjoyed in a healthy way, by EATING the fruit. ? ?Thanks for choosing [facility], we consider it a privelige to serve you.  ?

## 2021-07-30 NOTE — Assessment & Plan Note (Addendum)
BP Readings from Last 3 Encounters:  ?07/30/21 (!) 130/92  ?07/17/21 (!) 168/108  ?07/01/21 132/88  ?Uncontrolled hypertension, takes losartan 50 mg daily, carvedilol 12.5 mg twice daily ?Start taking losartan 100 mg daily, continue carvedilol 12.5 mg twice daily. ?DASH diet advised, engage in regular exercise 30 minutes per day,  5 times weekly ?Check CMP plus EGFR today due to low potassium level in the ED. ?BMP in 2 weeks  ?Follow-up in 4 weeks.  ?

## 2021-07-30 NOTE — Assessment & Plan Note (Signed)
>>  ASSESSMENT AND PLAN FOR UNCONTROLLED DIABETES MELLITUS WITH HYPERGLYCEMIA, WITH LONG-TERM CURRENT USE OF INSULIN  (HCC) WRITTEN ON 07/30/2021  8:59 PM BY Wyonia Fontanella R, FNP  Patient is overdue for visit to endocrinology.  During last visit I told patient to follow-up with endocrinologist but up till now  patient has not called endocrinologist office to schedule an appointment. She has been taking insulin  gargling 80 units daily, glipizide  5 mg daily.  States that she ran out of glipizide  a week ago glipizide  refilled today Patient told to continue insulin  glargine 80 units daily, glipizide  5 mg daily call Dr. Barbette office to schedule an appointment to be seen. A1c ordered today, foot exam completed today.   Need to keep all follow-up appointments discussed with patient she verbalized understanding

## 2021-07-30 NOTE — Assessment & Plan Note (Deleted)
Lab Results  ?Component Value Date  ? HGBA1C 13.2 (H) 07/30/2020  ? ?Patient is overdue for visit to endocrinology.  During last visit I told patient to follow-up with endocrinologist but up till now  patient has not called endocrinologist office to schedule an appointment. ?She has been taking insulin gargling 80 units daily, glipizide 5 mg daily.  States that she ran out of glipizide a week ago glipizide refilled today ?Patient told to continue insulin glargine 80 units daily, glipizide 5 mg daily call Dr. Liliane Channel office to schedule an appointment to be seen. ?A1c ordered today, foot exam completed today. ?  ?Need to keep all follow-up appointments discussed with patient she verbalized understanding ?

## 2021-07-30 NOTE — Assessment & Plan Note (Signed)
Patient is overdue for visit to endocrinology.  During last visit I told patient to follow-up with endocrinologist but up till now  patient has not called endocrinologist office to schedule an appointment. ?She has been taking insulin gargling 80 units daily, glipizide 5 mg daily.  States that she ran out of glipizide a week ago glipizide refilled today ?Patient told to continue insulin glargine 80 units daily, glipizide 5 mg daily call Dr. Liliane Channel office to schedule an appointment to be seen. ?A1c ordered today, foot exam completed today. ?  ?Need to keep all follow-up appointments discussed with patient she verbalized understanding ? ?

## 2021-07-31 ENCOUNTER — Other Ambulatory Visit: Payer: Self-pay | Admitting: Nurse Practitioner

## 2021-07-31 DIAGNOSIS — E1165 Type 2 diabetes mellitus with hyperglycemia: Secondary | ICD-10-CM

## 2021-07-31 DIAGNOSIS — Z794 Long term (current) use of insulin: Secondary | ICD-10-CM

## 2021-07-31 DIAGNOSIS — E782 Mixed hyperlipidemia: Secondary | ICD-10-CM

## 2021-07-31 DIAGNOSIS — E039 Hypothyroidism, unspecified: Secondary | ICD-10-CM

## 2021-07-31 DIAGNOSIS — I10 Essential (primary) hypertension: Secondary | ICD-10-CM

## 2021-07-31 LAB — LIPID PANEL
Chol/HDL Ratio: 2 ratio (ref 0.0–4.4)
Cholesterol, Total: 141 mg/dL (ref 100–199)
HDL: 71 mg/dL (ref >39)
LDL Chol Calc (NIH): 55 mg/dL (ref 0–99)
Triglycerides: 76 mg/dL (ref 0–149)
VLDL Cholesterol Cal: 15 mg/dL (ref 5–40)

## 2021-07-31 LAB — CMP14+EGFR
ALT: 13 IU/L (ref 0–32)
AST: 13 IU/L (ref 0–40)
Albumin/Globulin Ratio: 1.5 (ref 1.2–2.2)
Albumin: 4.1 g/dL (ref 3.8–4.8)
Alkaline Phosphatase: 85 IU/L (ref 44–121)
BUN/Creatinine Ratio: 16 (ref 9–23)
BUN: 12 mg/dL (ref 6–24)
Bilirubin Total: 0.8 mg/dL (ref 0.0–1.2)
CO2: 28 mmol/L (ref 20–29)
Calcium: 9.3 mg/dL (ref 8.7–10.2)
Chloride: 102 mmol/L (ref 96–106)
Creatinine, Ser: 0.76 mg/dL (ref 0.57–1.00)
Globulin, Total: 2.8 g/dL (ref 1.5–4.5)
Glucose: 150 mg/dL — ABNORMAL HIGH (ref 70–99)
Potassium: 3.7 mmol/L (ref 3.5–5.2)
Sodium: 143 mmol/L (ref 134–144)
Total Protein: 6.9 g/dL (ref 6.0–8.5)
eGFR: 95 mL/min/{1.73_m2} (ref >59)

## 2021-07-31 LAB — VITAMIN D 25 HYDROXY (VIT D DEFICIENCY, FRACTURES): Vit D, 25-Hydroxy: 32.5 ng/mL (ref 30.0–100.0)

## 2021-07-31 LAB — TSH+FREE T4
Free T4: 1.73 ng/dL (ref 0.82–1.77)
TSH: 0.289 u[IU]/mL — ABNORMAL LOW (ref 0.450–4.500)

## 2021-07-31 LAB — HEMOGLOBIN A1C
Est. average glucose Bld gHb Est-mCnc: 312 mg/dL
Hgb A1c MFr Bld: 12.5 % — ABNORMAL HIGH (ref 4.8–5.6)

## 2021-07-31 MED ORDER — GLIPIZIDE ER 5 MG PO TB24: ORAL_TABLET | ORAL | 0 refills | Status: DC

## 2021-07-31 NOTE — Progress Notes (Signed)
Pt should start taking glipizide '10mg'$  daily for her diabetes, continue her insulin as ordered, her diabetes is not well controlled  Please tell pt to make sure that pt has scheduled an appointment with Dr. Dorris Fetch  ? ?Pt should stop taking levothyroxine due to over correction, we will recheck her levels in 4 weeks and restart med if needed. Please schedule pt for a follow up in 4 weeks to recheck her TSH.  ?Thanks

## 2021-07-31 NOTE — Progress Notes (Signed)
Good morning Dr. Dorris Fetch.  ?This pt has an upcoming appointment with you. She has been taking synthroid 2mg daily for her hypothyroidism, I told her to stop taking med today due to overcorrection as shown on her recent labs ? ?I also told her to start taking glipizide '10mg'$  daily from 5 mg daily. Continue her insulin '80mg'$  daily until she sees you.  ? ?Thank you for  managing her hypothyroidism and T2DM.  ? ?Thank you

## 2021-08-01 ENCOUNTER — Other Ambulatory Visit: Payer: Self-pay | Admitting: Nurse Practitioner

## 2021-08-01 DIAGNOSIS — E039 Hypothyroidism, unspecified: Secondary | ICD-10-CM

## 2021-08-01 MED ORDER — LEVOTHYROXINE SODIUM 50 MCG PO TABS: 50.0000 ug | ORAL_TABLET | Freq: Every day | ORAL | 0 refills | Status: DC

## 2021-08-05 ENCOUNTER — Telehealth: Payer: Self-pay

## 2021-08-05 ENCOUNTER — Other Ambulatory Visit: Payer: Self-pay

## 2021-08-05 ENCOUNTER — Encounter: Payer: Self-pay | Admitting: "Endocrinology

## 2021-08-05 ENCOUNTER — Ambulatory Visit (INDEPENDENT_AMBULATORY_CARE_PROVIDER_SITE_OTHER): Payer: BC Managed Care – PPO | Admitting: "Endocrinology

## 2021-08-05 VITALS — BP 158/92 | HR 92 | Ht 60.0 in | Wt 156.8 lb

## 2021-08-05 DIAGNOSIS — E039 Hypothyroidism, unspecified: Secondary | ICD-10-CM

## 2021-08-05 DIAGNOSIS — I10 Essential (primary) hypertension: Secondary | ICD-10-CM

## 2021-08-05 DIAGNOSIS — Z91199 Patient's noncompliance with other medical treatment and regimen due to unspecified reason: Secondary | ICD-10-CM

## 2021-08-05 DIAGNOSIS — E782 Mixed hyperlipidemia: Secondary | ICD-10-CM | POA: Diagnosis not present

## 2021-08-05 DIAGNOSIS — E119 Type 2 diabetes mellitus without complications: Secondary | ICD-10-CM | POA: Diagnosis not present

## 2021-08-05 MED ORDER — BYDUREON BCISE 2 MG/0.85ML ~~LOC~~ AUIJ: 2.0000 mg | AUTO-INJECTOR | SUBCUTANEOUS | 1 refills | Status: DC

## 2021-08-05 MED ORDER — TOUJEO MAX SOLOSTAR 300 UNIT/ML ~~LOC~~ SOPN: 80.0000 [IU] | PEN_INJECTOR | Freq: Every day | SUBCUTANEOUS | 2 refills | Status: DC

## 2021-08-05 NOTE — Telephone Encounter (Signed)
Called pt no answer left vm 

## 2021-08-05 NOTE — Progress Notes (Signed)
08/05/2021, 2:44 PM  Endocrinology follow-up note   Subjective:    Patient ID: Kathryn [name], female    DOB: 04-21-1971.  Mollyann A Fooks is being seen in follow-up after she was seen in consultation for management of currently uncontrolled symptomatic diabetes requested by  Renee Rival, FNP.  She missed her appointment since June 2021.   Past Medical History:  Diagnosis Date   Anemia    Anxiety    Asthma    Depression    GERD (gastroesophageal reflux disease)    HLD (hyperlipidemia) 04/07/2019   Hypertension    Hypothyroidism, adult 04/07/2019   Neuropathy    Obesity (BMI 30.0-34.9) 04/07/2019   Schizoaffective disorder ([place])    Type II diabetes mellitus, uncontrolled 04/27/2019    Past Surgical History:  Procedure Laterality Date   BACK SURGERY  09/06/2020   BIOPSY  05/03/2021   Procedure: BIOPSY;  Surgeon: [name], MD;  [facility];  Service: Gastroenterology;;   BREAST SURGERY Right    biopsy   CESAREAN SECTION     CHOLECYSTECTOMY  06/02/2012   Procedure: LAPAROSCOPIC CHOLECYSTECTOMY;  Surgeon: Jamesetta So, MD;  Location: [facility];  Service: General;  Laterality: N/A;  Attempted laparoscopic cholecystectomy   CHOLECYSTECTOMY  06/02/2012   Procedure: CHOLECYSTECTOMY;  Surgeon: Jamesetta So, MD;  Location: [facility];  Service: General;  Laterality: N/A;  converted to open at  0479   COLONOSCOPY WITH PROPOFOL N/A 07/18/2020   prep was fair, one 5m polyp (inflammatory) stool in descending colon, transverse and ascending, distal rectum and anal verge normal   ESOPHAGEAL DILATION  12/31/2018   Procedure: ESOPHAGEAL DILATION;  Surgeon: RRogene Houston MD;  [facility];  Service: Endoscopy;;   ESOPHAGOGASTRODUODENOSCOPY (EGD) WITH PROPOFOL N/A 08/24/2015   Procedure: ESOPHAGOGASTRODUODENOSCOPY (EGD) WITH PROPOFOL;  Surgeon: NRogene Houston MD;  [facility];  Service: Endoscopy;  Laterality: N/A;  1:10 - Ann to notify pt to arrive at 11:30   ESOPHAGOGASTRODUODENOSCOPY (EGD) WITH PROPOFOL N/A 12/31/2018   normal, no abnormality to explain dysphagia, esophagus dilated.   ESOPHAGOGASTRODUODENOSCOPY (EGD) WITH PROPOFOL N/A 05/03/2021   Procedure: ESOPHAGOGASTRODUODENOSCOPY (EGD) WITH PROPOFOL;  Surgeon: CHarvel Quale MD;  [facility];  Service: Gastroenterology;  Laterality: N/A;  1:20   FLEXIBLE SIGMOIDOSCOPY  07/17/2020   Procedure: FLEXIBLE SIGMOIDOSCOPY;  Surgeon: CMontez Morita DQuillian Quince MD;  [facility];  Service: Gastroenterology;;   POLYPECTOMY  07/18/2020   Procedure: POLYPECTOMY INTESTINAL;  Surgeon: CHarvel Quale MD;  [facility];  Service: Gastroenterology;;  ascending colon polyp;    SAVORY DILATION  05/03/2021   Procedure: SAVORY DILATION;  Surgeon: CHarvel Quale MD;  [facility];  Service: Gastroenterology;;   tooth removal  2019   all teeth removed   TUBAL LIGATION     X3    Social History   Socioeconomic History   Marital status: Married    Spouse name: Not on file   Number of children: Not on file   Years of education: Not on file   Highest education level: Not on file  Occupational History   Not on file  Tobacco Use  Smoking status: Never   Smokeless tobacco: Never  Vaping Use   Vaping Use: Never used  Substance and Sexual Activity   Alcohol use: No    Alcohol/week: 0.0 standard drinks   Drug use: No   Sexual activity: Not Currently    Birth control/protection: Surgical  Other Topics Concern   Not on file  Social History Narrative   Married for 22 years.On disability secondary to schizophrenia.Lives with husband.   Social Determinants of Health   Financial Resource Strain: Not on file  Food Insecurity: Not on file  Transportation Needs: Not on file  Physical Activity: Not on file  Stress: Not on file  Social  Connections: Not on file    Family History  Problem Relation Age of Onset   Hypertension Mother    Alcohol abuse Mother    Heart disease Mother    Kidney disease Mother    Hypertension Father    Alcohol abuse Sister    Stroke Other    Diabetes Other    Cancer Other    Seizures Other    Alcohol abuse Maternal Aunt    Alcohol abuse Paternal Aunt    Alcohol abuse Maternal Grandfather    Alcohol abuse Maternal Grandmother    Alcohol abuse Cousin    Hearing loss Daughter     Outpatient Encounter Medications as of 08/05/2021  Medication Sig   levothyroxine (SYNTHROID) 75 MCG tablet Take 75 mcg by mouth daily before breakfast.   albuterol (VENTOLIN HFA) 108 (90 Base) MCG/ACT inhaler Inhale 2 puffs into the lungs every 6 (six) hours as needed for wheezing or shortness of breath.   aspirin EC 81 MG tablet Take 81 mg by mouth daily.   atorvastatin (LIPITOR) 10 MG tablet Take 1 tablet (10 mg total) by mouth at bedtime.   benztropine (COGENTIN) 1 MG tablet TAKE 1 TABLET(1 MG) BY MOUTH AT BEDTIME   Blood Glucose Monitoring Suppl (ACCU-CHEK GUIDE ME) w/Device KIT 1 Piece by Does not apply route as directed.   budesonide-formoterol (SYMBICORT) 80-4.5 MCG/ACT inhaler Inhale 2 puffs into the lungs 2 (two) times daily.   carvedilol (COREG) 12.5 MG tablet Take 1 tablet (12.5 mg total) by mouth 2 (two) times daily.   Cholecalciferol (VITAMIN D-3) 125 MCG (5000 UT) TABS Take 2 tablets by mouth daily.   clonazePAM (KLONOPIN) 0.5 MG tablet Take 1 tablet (0.5 mg total) by mouth 2 (two) times daily as needed for anxiety.   DULoxetine (CYMBALTA) 60 MG capsule Take 1 capsule (60 mg total) by mouth 2 (two) times daily.   Exenatide ER (BYDUREON BCISE) 2 MG/0.85ML AUIJ Inject 2 mg into the skin every Monday.   gabapentin (NEURONTIN) 300 MG capsule Take 1 capsule (300 mg total) by mouth daily.   glipiZIDE (GLUCOTROL XL) 5 MG 24 hr tablet Take 7m daily   glucose blood (ACCU-CHEK GUIDE) test strip Use as  instructed   insulin glargine, 2 Unit Dial, (TOUJEO MAX SOLOSTAR) 300 UNIT/ML Solostar Pen Inject 80 Units into the skin at bedtime.   linaclotide (LINZESS) 290 MCG CAPS capsule Take 1 capsule (290 mcg total) by mouth daily before breakfast.   losartan (COZAAR) 100 MG tablet Take 1 tablet (100 mg total) by mouth daily.   Multiple Vitamin (MULTIVITAMIN) tablet Take 1 tablet by mouth daily.   pantoprazole (PROTONIX) 40 MG tablet Take 1 tablet (40 mg total) by mouth 2 (two) times daily.   Polyvinyl Alcohol-Povidone PF (REFRESH) 1.4-0.6 % SOLN Place 1 drop into both eyes  daily as needed (dry eyes).   risperiDONE (RISPERDAL) 2 MG tablet TAKE 1 TABLET EVERY MORNING AND 3 TABLETS AT BEDTIME   traZODone (DESYREL) 100 MG tablet TAKE 2 TABLETS(200 MG) BY MOUTH AT BEDTIME   [DISCONTINUED] Exenatide ER (BYDUREON BCISE) 2 MG/0.85ML AUIJ Inject 2 mg into the skin every Monday. (Patient not taking: Reported on 08/05/2021)   [DISCONTINUED] insulin glargine, 2 Unit Dial, (TOUJEO MAX SOLOSTAR) 300 UNIT/ML Solostar Pen Inject 80 Units into the skin daily. (Patient taking differently: Inject 80 Units into the skin 2 (two) times daily.)   [DISCONTINUED] levothyroxine (SYNTHROID) 50 MCG tablet Take 1 tablet (50 mcg total) by mouth daily.   No facility-administered encounter medications on file as of 08/05/2021.    ALLERGIES: No Known Allergies  VACCINATION STATUS: Immunization History  Administered Date(s) Administered   Influenza Split 06/03/2012   Influenza,inj,Quad PF,6+ Mos 04/07/2019, 03/15/2020, 07/01/2021   Moderna Sars-Covid-2 Vaccination 09/02/2019, 09/30/2019   Pneumococcal Polysaccharide-23 06/03/2012   Tdap 05/24/2014   Zoster Recombinat (Shingrix) 07/01/2021    Diabetes She presents for her follow-up diabetic visit. She has type 2 diabetes mellitus. Onset time: She was diagnosed at approximate age of 25 years. Her disease course has been worsening (Patient was previously seen in this practice  however disappeared from care for unclear reasons.). There are no hypoglycemic associated symptoms. Pertinent negatives for hypoglycemia include no confusion, headaches, pallor or seizures. Associated symptoms include fatigue, polydipsia and polyuria. Pertinent negatives for diabetes include no chest pain and no polyphagia. There are no hypoglycemic complications. Symptoms are worsening. Diabetic complications include peripheral neuropathy. Risk factors for coronary artery disease include dyslipidemia, diabetes mellitus, hypertension, obesity and sedentary lifestyle. Current diabetic treatments: She is currently on Bydureon 2 mg weekly, Toujeo 132 units daily. She is following a generally unhealthy diet. When asked about meal planning, she reported none. She has not had a previous visit with a dietitian. She rarely participates in exercise. Her home blood glucose trend is increasing steadily. (She kept missing her appointments.  Has not been seen in a year.  She returns with persistent severe glycemic burden with A1c of 12.5%.  Her previous A1c was 13.2%.  He denies hypoglycemia.   ) An ACE inhibitor/angiotensin II receptor blocker is being taken. Eye exam is current.  Hyperlipidemia This is a chronic problem. The current episode started more than 1 year ago. The problem is uncontrolled. Recent lipid tests were reviewed and are high. Exacerbating diseases include diabetes and obesity. Pertinent negatives include no chest pain, myalgias or shortness of breath. Current antihyperlipidemic treatment includes statins. Risk factors for coronary artery disease include family history, dyslipidemia, obesity, hypertension, diabetes mellitus and a sedentary lifestyle.  Hypertension This is a chronic problem. The current episode started more than 1 year ago. The problem is controlled. Pertinent negatives include no chest pain, headaches, palpitations or shortness of breath. Risk factors for coronary artery disease include  dyslipidemia, diabetes mellitus, obesity, family history and sedentary lifestyle. Past treatments include angiotensin blockers. Identifiable causes of hypertension include a thyroid problem.  Thyroid Problem Presents for initial visit. Onset time: She was diagnosed at approximate age of 66 years. Symptoms include depressed mood and fatigue. Patient reports no cold intolerance, diarrhea, heat intolerance or palpitations. The symptoms have been worsening. Treatments tried: She is currently on natures Armour 90 mg p.o. daily. Her past medical history is significant for diabetes and hyperlipidemia.    Review of Systems  Constitutional:  Positive for fatigue. Negative for chills, fever and unexpected  weight change.  HENT:  Negative for trouble swallowing and voice change.   Eyes:  Negative for visual disturbance.  Respiratory:  Negative for cough, shortness of breath and wheezing.   Cardiovascular:  Negative for chest pain, palpitations and leg swelling.  Gastrointestinal:  Negative for diarrhea, nausea and vomiting.  Endocrine: Positive for polydipsia and polyuria. Negative for cold intolerance, heat intolerance and polyphagia.  Musculoskeletal:  Negative for arthralgias and myalgias.  Skin:  Negative for color change, pallor, rash and wound.  Neurological:  Negative for seizures and headaches.  Psychiatric/Behavioral:  Negative for confusion and suicidal ideas.    Objective:    Vitals with BMI 08/05/2021 07/30/2021 07/30/2021  Height '5\' 0"'  - -  Weight 156 lbs 13 oz - -  BMI 44.31 - -  Systolic 540 086 761  Diastolic 92 92 98  Pulse 92 - -  Some encounter information is confidential and restricted. Go to Review Flowsheets activity to see all data.    BP (!) 158/92    Pulse 92    Ht 5' (1.524 m)    Wt 156 lb 12.8 oz (71.1 kg)    LMP 01/23/2013    BMI 30.62 kg/m   Wt Readings from Last 3 Encounters:  08/05/21 156 lb 12.8 oz (71.1 kg)  07/30/21 154 lb (69.9 kg)  07/01/21 154 lb (69.9 kg)       CMP     Component Value Date/Time   NA 143 07/30/2021 1116   K 3.7 07/30/2021 1116   CL 102 07/30/2021 1116   CO2 28 07/30/2021 1116   GLUCOSE 150 (H) 07/30/2021 1116   GLUCOSE 390 (H) 07/17/2021 1411   BUN 12 07/30/2021 1116   CREATININE 0.76 07/30/2021 1116   CREATININE 0.90 07/30/2020 1317   CALCIUM 9.3 07/30/2021 1116   PROT 6.9 07/30/2021 1116   ALBUMIN 4.1 07/30/2021 1116   AST 13 07/30/2021 1116   ALT 13 07/30/2021 1116   ALKPHOS 85 07/30/2021 1116   BILITOT 0.8 07/30/2021 1116   GFRNONAA >60 07/17/2021 1411   GFRNONAA 75 07/30/2020 1317   GFRAA 87 07/30/2020 1317     Diabetic Labs (most recent): Lab Results  Component Value Date   HGBA1C 12.5 (H) 07/30/2021   HGBA1C 13.2 (H) 07/30/2020   HGBA1C 10.9 (A) 10/25/2019     Lipid Panel ( most recent) Lipid Panel     Component Value Date/Time   CHOL 141 07/30/2021 1116   TRIG 76 07/30/2021 1116   HDL 71 07/30/2021 1116   CHOLHDL 2.0 07/30/2021 1116   CHOLHDL 2.0 07/30/2020 1317   LDLCALC 55 07/30/2021 1116   LDLCALC 37 07/30/2020 1317   LABVLDL 15 07/30/2021 1116      Lab Results  Component Value Date   TSH 0.289 (L) 07/30/2021   TSH CANCELED 11/01/2020   TSH 1.18 06/28/2020   TSH 1.26 02/07/2020   TSH 1.15 07/19/2019   TSH 1.57 04/07/2019   FREET4 1.73 07/30/2021   FREET4 1.3 07/19/2019      Assessment & Plan:   1. Uncontrolled type 2 diabetes mellitus with hyperglycemia (HCC)  - Myra A Litzinger has currently uncontrolled symptomatic type 2 DM since  51 years of age.  She kept missing her appointments.  Has not been seen in a year.  She returns with persistent severe glycemic burden with A1c of 12.5%.  Her previous A1c was 13.2%.  He denies hypoglycemia.   She had A1c as high as greater than 14% last year.  Recent labs reviewed.  She presents with significantly above target glycemic profile was fasting and postprandial.    Her recent labs show a1c of  >14% .  - I had a long discussion  with her about the progressive nature of diabetes and the pathology behind its complications. -her diabetes is complicated by noncompliance/nonadherence, obesity/sedentary life and she remains at exceedingly high risk for more acute and chronic complications which include CAD, CVA, CKD, retinopathy, and neuropathy. These are all discussed in detail with her.  - I have counseled her on diet  and weight management  by adopting a carbohydrate restricted/protein rich diet. Patient is encouraged to switch to  unprocessed or minimally processed     complex starch and increased protein intake (animal or plant source), fruits, and vegetables. -  she is advised to stick to a routine mealtimes to eat 3 meals  a day and avoid unnecessary snacks ( to snack only to correct hypoglycemia).   - she acknowledges that there is a room for improvement in her food and drink choices. - Suggestion is made for her to avoid simple carbohydrates  from her diet including Cakes, Sweet Desserts, Ice Cream, Soda (diet and regular), Sweet Tea, Candies, Chips, Cookies, Store Bought Juices, Alcohol in Excess of  1-2 drinks a day, Artificial Sweeteners,  Coffee Creamer, and "Sugar-free" Products, Lemonade. This will help patient to have more stable blood glucose profile and potentially avoid unintended weight gain.  - she will be scheduled with Jearld Fenton, RDN, CDE for diabetes education.  - I have approached her with the following individualized plan to manage  her diabetes and patient agrees:   -Patient remains alarmingly noncompliant.  She is not keeping her clinic appointment follow-ups.  She returns after a year of absence from clinic.  -In light of her presentation with severe glycemic burden, she would need multiple daily injections of insulin however patient has problem engaging with this kind of regimen.   She is advised to be consistent and lower her Toujeo to 80 units nightly, start monitoring blood glucose 4 times a  day-before meals and at bedtime and return in 1 week for reevaluation.  -If she presents with postprandial hyperglycemia, she will be considered for prandial insulin.  -She is warned not to take insulin without proper monitoring of blood glucose.  -She reports intolerance to Metformin.    - she is encouraged to call clinic for blood glucose levels less than 70 or above 300 mg /dl. -I discussed and continue her Bydureon 2 mg subcutaneously weekly. -I also advised her to continue glipizide 5 mg XL p.o. daily at breakfast.   - Specific targets for  A1c;  LDL, HDL,  and Triglycerides were discussed with the patient.  2) Blood Pressure /Hypertension:  Her blood pressure is not controlled to target.  she is advised to continue her current medications including losartan 50 mg p.o. daily with breakfast .  3) Lipids/Hyperlipidemia:   Review of her recent lipid panel showed un controlled triglycerides at 362, and LDL at  67.  she  is advised to continue atorvastatin 10 mg p.o. daily at bedtime.  This patient would benefit from [place] , will elaborate more during her next visit.   Atorvastatin side effects and precautions discussed with her.  4)  Weight/Diet:  Body mass index is 30.62 kg/m.  -     she is  a candidate for modest weight loss. I discussed with her the fact that loss of  5 - 10% of her  current body weight will have the most impact on her diabetes management.  Exercise, and detailed carbohydrates information provided  -  detailed on discharge instructions.  5) hypothyroidism: Longstanding diagnosis.  Her problem has been inconsistency taking her medication.  Her PMD lowered her levothyroxine to 50 mcg p.o. daily before breakfast.  She is advised to continue same.    - We discussed about the correct intake of her thyroid hormone, on empty stomach at fasting, with water, separated by at least 30 minutes from breakfast and other medications,  and separated by more than 4 hours from calcium,  iron, multivitamins, acid reflux medications (PPIs). -Patient is made aware of the fact that thyroid hormone replacement is needed for life, dose to be adjusted by periodic monitoring of thyroid function tests.   6) Chronic Care/Health Maintenance:  -she  is on ACEI/ARB and Statin medications and  is encouraged to initiate and continue to follow up with Ophthalmology, Dentist,  Podiatrist at least yearly or according to recommendations, and advised to   stay away from smoking. I have recommended yearly flu vaccine and pneumonia vaccine at least every 5 years; moderate intensity exercise for up to 150 minutes weekly; and  sleep for at least 7 hours a day.  - she is  advised to maintain close follow up with Renee Rival, FNP for primary care needs, as well as her other providers for optimal and coordinated care.   I spent 41 minutes in the care of the patient today including review of labs from [place], Lipids, Thyroid Function, Hematology (current and previous including abstractions from other facilities); face-to-face time discussing  her blood glucose readings/logs, discussing hypoglycemia and hyperglycemia episodes and symptoms, medications doses, her options of short and long term treatment based on the latest standards of care / guidelines;  discussion about incorporating lifestyle medicine;  and documenting the encounter.    Please refer to Patient Instructions for Blood Glucose Monitoring and Insulin/Medications Dosing Guide"  in media tab for additional information. Please  also refer to " Patient Self Inventory" in the Media  tab for reviewed elements of pertinent patient history.  Aricela A Coultas participated in the discussions, expressed understanding, and voiced agreement with the above plans.  All questions were answered to her satisfaction. she is encouraged to contact clinic should she have any questions or concerns prior to her return visit.  Follow up plan: - Return in about 2  weeks (around 08/19/2021) for F/U with Meter and Logs Only - no Labs.  Glade Lloyd, MD [facility] [address] [place], [place] [address] Phone: [phone]  Fax: [phone]    08/05/2021, 2:44 PM  This note was partially dictated with voice recognition software. Similar sounding words can be transcribed inadequately or may not  be corrected upon review.

## 2021-08-05 NOTE — Telephone Encounter (Signed)
Patient returning lab results call 

## 2021-08-05 NOTE — Telephone Encounter (Signed)
Patient returned your call ph# [phone] ?

## 2021-08-05 NOTE — Patient Instructions (Signed)

## 2021-08-05 NOTE — Telephone Encounter (Signed)
Called pt vm full  ?

## 2021-08-06 NOTE — Telephone Encounter (Signed)
Patient returning call about test results. ?

## 2021-08-06 NOTE — Telephone Encounter (Signed)
LMTRC

## 2021-08-07 ENCOUNTER — Encounter: Payer: Self-pay | Admitting: *Deleted

## 2021-08-07 NOTE — Telephone Encounter (Signed)
Called pt no answer left vm 

## 2021-08-07 NOTE — Telephone Encounter (Signed)
Patient called asked to call no later than 10:00 am she goes to sleep so she can go to work.  ?

## 2021-08-07 NOTE — Telephone Encounter (Signed)
Mychart message sent to patient as well.

## 2021-08-12 ENCOUNTER — Ambulatory Visit (INDEPENDENT_AMBULATORY_CARE_PROVIDER_SITE_OTHER): Payer: BC Managed Care – PPO | Admitting: Gastroenterology

## 2021-08-12 ENCOUNTER — Encounter (INDEPENDENT_AMBULATORY_CARE_PROVIDER_SITE_OTHER): Payer: Self-pay | Admitting: Gastroenterology

## 2021-08-16 ENCOUNTER — Encounter (HOSPITAL_COMMUNITY): Payer: Self-pay | Admitting: Psychiatry

## 2021-08-16 ENCOUNTER — Other Ambulatory Visit: Payer: Self-pay

## 2021-08-16 ENCOUNTER — Telehealth (INDEPENDENT_AMBULATORY_CARE_PROVIDER_SITE_OTHER): Payer: BC Managed Care – PPO | Admitting: Psychiatry

## 2021-08-16 DIAGNOSIS — J4540 Moderate persistent asthma, uncomplicated: Secondary | ICD-10-CM | POA: Diagnosis not present

## 2021-08-16 DIAGNOSIS — F4310 Post-traumatic stress disorder, unspecified: Secondary | ICD-10-CM

## 2021-08-16 DIAGNOSIS — F251 Schizoaffective disorder, depressive type: Secondary | ICD-10-CM

## 2021-08-16 MED ORDER — CLONAZEPAM 0.5 MG PO TABS
0.5000 mg | ORAL_TABLET | Freq: Two times a day (BID) | ORAL | 2 refills | Status: DC | PRN
Start: 2021-08-16 — End: 2021-11-05

## 2021-08-16 MED ORDER — TRAZODONE HCL 100 MG PO TABS: ORAL_TABLET | ORAL | 2 refills | Status: DC

## 2021-08-16 MED ORDER — RISPERIDONE 2 MG PO TABS: ORAL_TABLET | ORAL | 3 refills | Status: DC

## 2021-08-16 MED ORDER — BENZTROPINE MESYLATE 1 MG PO TABS: ORAL_TABLET | ORAL | 2 refills | Status: DC

## 2021-08-16 MED ORDER — DULOXETINE HCL 60 MG PO CPEP: 60.0000 mg | ORAL_CAPSULE | Freq: Two times a day (BID) | ORAL | 2 refills | Status: DC

## 2021-08-16 NOTE — Progress Notes (Signed)
Virtual Visit via Telephone Note ? ?I connected with Kathryn [name] on 08/16/21 at 11:00 AM EDT by telephone and verified that I am speaking with the correct person using two identifiers. ? ?Location: ?Patient: home ?Provider: office ?  ?I discussed the limitations, risks, security and privacy concerns of performing an evaluation and management service by telephone and the availability of in person appointments. I also discussed with the patient that there may be a patient responsible charge related to this service. The patient expressed understanding and agreed to proceed. ? ? ?  ?I discussed the assessment and treatment plan with the patient. The patient was provided an opportunity to ask questions and all were answered. The patient agreed with the plan and demonstrated an understanding of the instructions. ?  ?The patient was advised to call back or seek an in-person evaluation if the symptoms worsen or if the condition fails to improve as anticipated. ? ?I provided  minutes of non-face-to-face time during this encounter. ? ? ?Levonne Spiller, MD ? ?BH MD/PA/NP OP Progress Note ? ?08/16/2021 11:12 AM ?[name]  ?MRN:  [id number] ? ?Chief Complaint:  ?Chief Complaint  ?Patient presents with  ? Depression  ? Schizophrenia  ? Follow-up  ? ?HPI: This patient is a 51 year old  black female who lives with her husband in [place].  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 ?The patient returns for follow-up after 2 months.  She states that she is generally doing okay.  Her last visit with her endocrinologist was not that good.  Her A1c is generally over 12.  She admits she is not taking care of herself and had run out of her diabetic medications for several months.  She is trying to do better.  She has moved back in with her husband and he tries to keep her on track and this makes her angry.  Sometimes she is hears a voice in her head try telling her to hurt them  but she is claims she would never do this.  It sounds as if he has her best interest to heart. ? ?The patient denies being significantly depressed or anxious.  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 ?Visit Diagnosis:  ?  ICD-10-CM   ?1. Schizoaffective disorder, depressive type ([place])  F25.1   ?  ?2. Moderate persistent asthma, unspecified whether complicated  W09.81 DULoxetine (CYMBALTA) 60 MG capsule  ?  ?3. PTSD (post-traumatic stress disorder)  F43.10   ?  ? ? ?Past Psychiatric History: 2 previous psychiatric hospitalizations for schizoaffective disorder ? ?Past Medical History:  ?Past Medical History:  ?Diagnosis Date  ? Anemia   ? Anxiety   ? Asthma   ? Depression   ? GERD (gastroesophageal reflux disease)   ? HLD (hyperlipidemia) 04/07/2019  ? Hypertension   ? Hypothyroidism, adult 04/07/2019  ? Neuropathy   ? Obesity (BMI 30.0-34.9) 04/07/2019  ? Schizoaffective disorder ([place])   ? Type II diabetes mellitus, uncontrolled 04/27/2019  ?  ?Past Surgical History:  ?Procedure Laterality Date  ? BACK SURGERY  09/06/2020  ? BIOPSY  05/03/2021  ? Procedure: BIOPSY;  Surgeon: [name], [name], MD;  [facility];  Service: Gastroenterology;;  ? BREAST SURGERY Right   ? biopsy  ? CESAREAN SECTION    ? CHOLECYSTECTOMY  06/02/2012  ? Procedure: LAPAROSCOPIC CHOLECYSTECTOMY;  Surgeon:  Jamesetta So, MD;  Location: [facility];  Service: General;  Laterality: N/A;  Attempted laparoscopic cholecystectomy  ? CHOLECYSTECTOMY  06/02/2012  ? Procedure: CHOLECYSTECTOMY;  Surgeon: Jamesetta So, MD;  Location: [facility];  Service: General;  Laterality: N/A;  converted to open at  0905  ? COLONOSCOPY WITH PROPOFOL N/A 07/18/2020  ? prep was fair, one 61m polyp (inflammatory) stool in descending colon, transverse and ascending, distal rectum and anal verge normal  ? ESOPHAGEAL DILATION  12/31/2018  ? Procedure: ESOPHAGEAL DILATION;  Surgeon: RRogene Houston MD;   [facility];  Service: Endoscopy;;  ? ESOPHAGOGASTRODUODENOSCOPY (EGD) WITH PROPOFOL N/A 08/24/2015  ? Procedure: ESOPHAGOGASTRODUODENOSCOPY (EGD) WITH PROPOFOL;  Surgeon: NRogene Houston MD;  [facility];  Service: Endoscopy;  Laterality: N/A;  1:10 - Ann to notify pt to arrive at 11:30  ? ESOPHAGOGASTRODUODENOSCOPY (EGD) WITH PROPOFOL N/A 12/31/2018  ? normal, no abnormality to explain dysphagia, esophagus dilated.  ? ESOPHAGOGASTRODUODENOSCOPY (EGD) WITH PROPOFOL N/A 05/03/2021  ? Procedure: ESOPHAGOGASTRODUODENOSCOPY (EGD) WITH PROPOFOL;  Surgeon: CHarvel Quale MD;  [facility];  Service: Gastroenterology;  Laterality: N/A;  1:20  ? FLEXIBLE SIGMOIDOSCOPY  07/17/2020  ? Procedure: FLEXIBLE SIGMOIDOSCOPY;  Surgeon: CMontez Morita DQuillian Quince MD;  [facility];  Service: Gastroenterology;;  ? POLYPECTOMY  07/18/2020  ? Procedure: POLYPECTOMY INTESTINAL;  Surgeon: CMontez Morita DQuillian Quince MD;  [facility];  Service: Gastroenterology;;  ascending colon polyp;   ? SAVORY DILATION  05/03/2021  ? Procedure: SAVORY DILATION;  Surgeon: CMontez Morita DQuillian Quince MD;  [facility];  Service: Gastroenterology;;  ? tooth removal  2019  ? all teeth removed  ? TUBAL LIGATION    ? X3  ? ? ?Family Psychiatric History: See below ? ?Family History:  ?Family History  ?Problem Relation Age of Onset  ? Hypertension Mother   ? Alcohol abuse Mother   ? Heart disease Mother   ? Kidney disease Mother   ? Hypertension Father   ? Alcohol abuse Sister   ? Stroke Other   ? Diabetes Other   ? Cancer Other   ? Seizures Other   ? Alcohol abuse Maternal Aunt   ? Alcohol abuse Paternal Aunt   ? Alcohol abuse Maternal Grandfather   ? Alcohol abuse Maternal Grandmother   ? Alcohol abuse Cousin   ? Hearing loss Daughter   ? ? ?Social History:  ?Social History  ? ?Socioeconomic History  ? Marital status: Married  ?  Spouse name: Not on file  ? Number of children: Not  on file  ? Years of education: Not on file  ? Highest education level: Not on file  ?Occupational History  ? Not on file  ?Tobacco Use  ? Smoking status: Never  ? Smokeless tobacco: Never  ?Vaping Use  ? Vaping Use: Never used  ?Substance and Sexual Activity  ? Alcohol use: No  ?  Alcohol/week: 0.0 standard drinks  ? Drug use: No  ? Sexual activity: Not Currently  ?  Birth control/protection: Surgical  ?Other Topics Concern  ? Not on file  ?Social History Narrative  ? Married for 22 years.On disability secondary to schizophrenia.Lives with husband.  ? ?Social Determinants of Health  ? ?Financial Resource Strain: Not on file  ?Food Insecurity: Not on file  ?Transportation Needs: Not on file  ?Physical Activity: Not on file  ?Stress: Not on file  ?Social Connections: Not on file  ? ? ?Allergies: No  Known Allergies ? ?Metabolic Disorder Labs: ?Lab Results  ?Component Value Date  ? HGBA1C 12.5 (H) 07/30/2021  ? MPG 332 07/30/2020  ? MPG  07/19/2019  ?   Comment:  ?   eAG cannot be calculated. Hemoglobin A1c result ?exceeds the linearity of the assay. ?  ? ?No results found for: PROLACTIN ?Lab Results  ?Component Value Date  ? CHOL 141 07/30/2021  ? TRIG 76 07/30/2021  ? HDL 71 07/30/2021  ? CHOLHDL 2.0 07/30/2021  ? LDLCALC 55 07/30/2021  ? LDLCALC 37 07/30/2020  ? ?Lab Results  ?Component Value Date  ? TSH 0.289 (L) 07/30/2021  ? TSH CANCELED 11/01/2020  ? ? ?Therapeutic Level Labs: ?Lab Results  ?Component Value Date  ? LITHIUM <0.06 (L) 07/03/2016  ? LITHIUM 1.17 09/09/2015  ? ?No results found for: VALPROATE ?No components found for:  CBMZ ? ?Current Medications: ?Current Outpatient Medications  ?Medication Sig Dispense Refill  ? albuterol (VENTOLIN HFA) 108 (90 Base) MCG/ACT inhaler Inhale 2 puffs into the lungs every 6 (six) hours as needed for wheezing or shortness of breath. 8 g 2  ? aspirin EC 81 MG tablet Take 81 mg by mouth daily.    ? atorvastatin (LIPITOR) 10 MG tablet Take 1 tablet (10 mg total) by mouth  at bedtime. 90 tablet 0  ? benztropine (COGENTIN) 1 MG tablet TAKE 1 TABLET(1 MG) BY MOUTH AT BEDTIME 90 tablet 2  ? Blood Glucose Monitoring Suppl (ACCU-CHEK GUIDE ME) w/Device KIT 1 Piece by Does not

## 2021-08-19 ENCOUNTER — Encounter: Payer: Self-pay | Admitting: "Endocrinology

## 2021-08-19 ENCOUNTER — Ambulatory Visit (INDEPENDENT_AMBULATORY_CARE_PROVIDER_SITE_OTHER): Payer: BC Managed Care – PPO | Admitting: "Endocrinology

## 2021-08-19 ENCOUNTER — Other Ambulatory Visit: Payer: Self-pay

## 2021-08-19 VITALS — BP 148/96 | HR 72 | Ht 60.0 in | Wt 165.6 lb

## 2021-08-19 DIAGNOSIS — E039 Hypothyroidism, unspecified: Secondary | ICD-10-CM | POA: Diagnosis not present

## 2021-08-19 DIAGNOSIS — I10 Essential (primary) hypertension: Secondary | ICD-10-CM

## 2021-08-19 DIAGNOSIS — E119 Type 2 diabetes mellitus without complications: Secondary | ICD-10-CM

## 2021-08-19 DIAGNOSIS — E782 Mixed hyperlipidemia: Secondary | ICD-10-CM | POA: Diagnosis not present

## 2021-08-19 MED ORDER — TRULICITY 3 MG/0.5ML ~~LOC~~ SOAJ: 3.0000 mg | SUBCUTANEOUS | 2 refills | Status: DC

## 2021-08-19 NOTE — Patient Instructions (Signed)

## 2021-08-19 NOTE — Progress Notes (Signed)
? ?                                          ?     08/19/2021, 9:12 AM ? ?Endocrinology follow-up note ? ? ?Subjective:  ? ? Patient ID: Kathryn [name], female    DOB: 1971-05-24.  ?Kathryn [name] is being seen in follow-up after she was seen in consultation for management of currently uncontrolled symptomatic diabetes requested by  Renee Rival, FNP.  She missed her appointment since June 2021. ? ? ?Past Medical History:  ?Diagnosis Date  ? Anemia   ? Anxiety   ? Asthma   ? Depression   ? GERD (gastroesophageal reflux disease)   ? HLD (hyperlipidemia) 04/07/2019  ? Hypertension   ? Hypothyroidism, adult 04/07/2019  ? Neuropathy   ? Obesity (BMI 30.0-34.9) 04/07/2019  ? Schizoaffective disorder ([place])   ? Type II diabetes mellitus, uncontrolled 04/27/2019  ? ? ?Past Surgical History:  ?Procedure Laterality Date  ? BACK SURGERY  09/06/2020  ? BIOPSY  05/03/2021  ? Procedure: BIOPSY;  Surgeon: [name], [name], MD;  [facility];  Service: Gastroenterology;;  ? BREAST SURGERY Right   ? biopsy  ? CESAREAN SECTION    ? CHOLECYSTECTOMY  06/02/2012  ? Procedure: LAPAROSCOPIC CHOLECYSTECTOMY;  Surgeon: Jamesetta So, MD;  [facility]: [facility];  Service: General;  Laterality: N/A;  Attempted laparoscopic cholecystectomy  ? CHOLECYSTECTOMY  06/02/2012  ? Procedure: CHOLECYSTECTOMY;  Surgeon: Jamesetta So, MD;  [facility];  Service: General;  Laterality: N/A;  converted to open at  0905  ? COLONOSCOPY WITH PROPOFOL N/A 07/18/2020  ? prep was fair, one 76m polyp (inflammatory) stool in descending colon, transverse and ascending, distal rectum and anal verge normal  ? ESOPHAGEAL DILATION  12/31/2018  ? Procedure: ESOPHAGEAL DILATION;  Surgeon: RRogene Houston MD;  [facility];  Service: Endoscopy;;  ? ESOPHAGOGASTRODUODENOSCOPY (EGD) WITH PROPOFOL N/A 08/24/2015  ? Procedure: ESOPHAGOGASTRODUODENOSCOPY (EGD) WITH PROPOFOL;  Surgeon: NRogene Houston MD;  [facility];  Service: Endoscopy;  Laterality: N/A;  1:10 - Ann to notify pt to arrive at 11:30  ? ESOPHAGOGASTRODUODENOSCOPY (EGD) WITH PROPOFOL N/A 12/31/2018  ? normal, no abnormality to explain dysphagia, esophagus dilated.  ? ESOPHAGOGASTRODUODENOSCOPY (EGD) WITH PROPOFOL N/A 05/03/2021  ? Procedure: ESOPHAGOGASTRODUODENOSCOPY (EGD) WITH PROPOFOL;  Surgeon: CHarvel Quale MD;  [facility];  Service: Gastroenterology;  Laterality: N/A;  1:20  ? FLEXIBLE SIGMOIDOSCOPY  07/17/2020  ? Procedure: FLEXIBLE SIGMOIDOSCOPY;  Surgeon: CMontez Morita DQuillian Quince MD;  [facility];  Service: Gastroenterology;;  ? POLYPECTOMY  07/18/2020  ? Procedure: POLYPECTOMY INTESTINAL;  Surgeon: CMontez Morita DQuillian Quince MD;  [facility];  Service: Gastroenterology;;  ascending colon polyp;   ? SAVORY DILATION  05/03/2021  ? Procedure: SAVORY DILATION;  Surgeon: CMontez Morita DQuillian Quince MD;  [facility];  Service: Gastroenterology;;  ? tooth removal  2019  ? all teeth removed  ? TUBAL LIGATION    ? X3  ? ? ?Social History  ? ?Socioeconomic History  ? Marital status: Married  ?  Spouse name: Not on file  ? Number of children: Not on file  ? Years of education: Not on file  ? Highest education level: Not on file  ?Occupational History  ? Not on file  ?Tobacco Use  ?  Smoking status: Never  ? Smokeless tobacco: Never  ?Vaping Use  ? Vaping Use: Never used  ?Substance and Sexual Activity  ? Alcohol use: No  ?  Alcohol/week: 0.0 standard drinks  ? Drug use: No  ? Sexual activity: Not Currently  ?  Birth control/protection: Surgical  ?Other Topics Concern  ? Not on file  ?Social History Narrative  ? Married for 22 years.On disability secondary to schizophrenia.Lives with husband.  ? ?Social Determinants of Health  ? ?Financial Resource Strain: Not on file  ?Food Insecurity: Not on file  ?Transportation Needs: Not on file  ?Physical Activity: Not on file  ?Stress: Not on file  ?Social  Connections: Not on file  ? ? ?Family History  ?Problem Relation Age of Onset  ? Hypertension Mother   ? Alcohol abuse Mother   ? Heart disease Mother   ? Kidney disease Mother   ? Hypertension Father   ? Alcohol abuse Sister   ? Stroke Other   ? Diabetes Other   ? Cancer Other   ? Seizures Other   ? Alcohol abuse Maternal Aunt   ? Alcohol abuse Paternal Aunt   ? Alcohol abuse Maternal Grandfather   ? Alcohol abuse Maternal Grandmother   ? Alcohol abuse Cousin   ? Hearing loss Daughter   ? ? ?Outpatient Encounter Medications as of 08/19/2021  ?Medication Sig  ? Dulaglutide (TRULICITY) 3 PV/6.6KD SOPN Inject 3 mg as directed once a week.  ? albuterol (VENTOLIN HFA) 108 (90 Base) MCG/ACT inhaler Inhale 2 puffs into the lungs every 6 (six) hours as needed for wheezing or shortness of breath.  ? aspirin EC 81 MG tablet Take 81 mg by mouth daily.  ? atorvastatin (LIPITOR) 10 MG tablet Take 1 tablet (10 mg total) by mouth at bedtime.  ? benztropine (COGENTIN) 1 MG tablet TAKE 1 TABLET(1 MG) BY MOUTH AT BEDTIME  ? Blood Glucose Monitoring Suppl (ACCU-CHEK GUIDE ME) w/Device KIT 1 Piece by Does not apply route as directed.  ? budesonide-formoterol (SYMBICORT) 80-4.5 MCG/ACT inhaler Inhale 2 puffs into the lungs 2 (two) times daily.  ? carvedilol (COREG) 12.5 MG tablet Take 1 tablet (12.5 mg total) by mouth 2 (two) times daily.  ? Cholecalciferol (VITAMIN D-3) 125 MCG (5000 UT) TABS Take 2 tablets by mouth daily.  ? clonazePAM (KLONOPIN) 0.5 MG tablet Take 1 tablet (0.5 mg total) by mouth 2 (two) times daily as needed for anxiety.  ? DULoxetine (CYMBALTA) 60 MG capsule Take 1 capsule (60 mg total) by mouth 2 (two) times daily.  ? gabapentin (NEURONTIN) 300 MG capsule Take 1 capsule (300 mg total) by mouth daily.  ? glipiZIDE (GLUCOTROL XL) 5 MG 24 hr tablet Take 101m daily  ? glucose blood (ACCU-CHEK GUIDE) test strip Use as instructed  ? insulin glargine, 2 Unit Dial, (TOUJEO MAX SOLOSTAR) 300 UNIT/ML Solostar Pen Inject 80  Units into the skin at bedtime.  ? levothyroxine (SYNTHROID) 75 MCG tablet Take 75 mcg by mouth daily before breakfast.  ? linaclotide (LINZESS) 290 MCG CAPS capsule Take 1 capsule (290 mcg total) by mouth daily before breakfast.  ? losartan (COZAAR) 100 MG tablet Take 1 tablet (100 mg total) by mouth daily.  ? Multiple Vitamin (MULTIVITAMIN) tablet Take 1 tablet by mouth daily.  ? pantoprazole (PROTONIX) 40 MG tablet Take 1 tablet (40 mg total) by mouth 2 (two) times daily.  ? Polyvinyl Alcohol-Povidone PF (REFRESH) 1.4-0.6 % SOLN Place 1 drop into both eyes daily as  needed (dry eyes).  ? risperiDONE (RISPERDAL) 2 MG tablet TAKE 1 TABLET EVERY MORNING AND 3 TABLETS AT BEDTIME  ? traZODone (DESYREL) 100 MG tablet TAKE 2 TABLETS(200 MG) BY MOUTH AT BEDTIME  ? [DISCONTINUED] Exenatide ER (BYDUREON BCISE) 2 MG/0.85ML AUIJ Inject 2 mg into the skin every Monday.  ? ?No facility-administered encounter medications on file as of 08/19/2021.  ? ? ?ALLERGIES: ?No Known Allergies ? ?VACCINATION STATUS: ?Immunization History  ?Administered Date(s) Administered  ? Influenza Split 06/03/2012  ? Influenza,inj,Quad PF,6+ Mos 04/07/2019, 03/15/2020, 07/01/2021  ? Moderna Sars-Covid-2 Vaccination 09/02/2019, 09/30/2019  ? Pneumococcal Polysaccharide-23 06/03/2012  ? Tdap 05/24/2014  ? Zoster Recombinat (Shingrix) 07/01/2021  ? ? ?Diabetes ?She presents for her follow-up diabetic visit. She has type 2 diabetes mellitus. Onset time: She was diagnosed at approximate age of 63 years. Her disease course has been improving (Patient was previously seen in this practice however disappeared from care for unclear reasons.). There are no hypoglycemic associated symptoms. Pertinent negatives for hypoglycemia include no confusion, headaches, pallor or seizures. Associated symptoms include fatigue, polydipsia and polyuria. Pertinent negatives for diabetes include no chest pain and no polyphagia. There are no hypoglycemic complications. Symptoms  are improving. Diabetic complications include peripheral neuropathy. Risk factors for coronary artery disease include dyslipidemia, diabetes mellitus, hypertension, obesity and sedentary lifestyle. Cu

## 2021-08-19 NOTE — Progress Notes (Signed)
Virtual Visit via Telephone Note ? ?I connected with Kathryn [name] on 08/19/21 at 11:00 AM EDT by telephone and verified that I am speaking with the correct person using two identifiers. ? ?Location: ?Patient: home ?Provider: office ?  ?I discussed the limitations, risks, security and privacy concerns of performing an evaluation and management service by telephone and the availability of in person appointments. I also discussed with the patient that there may be a patient responsible charge related to this service. The patient expressed understanding and agreed to proceed. ? ? ?  ?I discussed the assessment and treatment plan with the patient. The patient was provided an opportunity to ask questions and all were answered. The patient agreed with the plan and demonstrated an understanding of the instructions. ?  ?The patient was advised to call back or seek an in-person evaluation if the symptoms worsen or if the condition fails to improve as anticipated. ? ?I provided 15 minutes of non-face-to-face time during this encounter. ? ? ?Levonne Spiller, MD ? ?BH MD/PA/NP OP Progress Note ? ?08/19/2021 10:58 AM ?Kathryn [name]  ?MRN:  [id number] ? ?Chief Complaint:  ?Chief Complaint  ?Patient presents with  ? Depression  ? Schizophrenia  ? Follow-up  ? ?HPI: This patient is a 51 year old  black female who lives with her husband in [place].  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 ?The patient returns for follow-up after 2 months.  She states that she is generally doing okay.  Her last visit with her endocrinologist was not that good.  Her A1c is generally over 12.  She admits she is not taking care of herself and had run out of her diabetic medications for several months.  She is trying to do better.  She has moved back in with her husband and he tries to keep her on track and this makes her angry.  Sometimes she is hears a voice in her head try telling her to hurt them  but she is claims she would never do this.  It sounds as if he has her best interest to heart. ? ?The patient denies being significantly depressed or anxious.  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 ?Visit Diagnosis:  ?  ICD-10-CM   ?1. Schizoaffective disorder, depressive type ([place])  F25.1   ?  ?2. Moderate persistent asthma, unspecified whether complicated  R74.08 DULoxetine (CYMBALTA) 60 MG capsule  ?  ?3. PTSD (post-traumatic stress disorder)  F43.10   ?  ? ? ?Past Psychiatric History: 2 previous psychiatric hospitalizations for schizoaffective disorder ? ?Past Medical History:  ?Past Medical History:  ?Diagnosis Date  ? Anemia   ? Anxiety   ? Asthma   ? Depression   ? GERD (gastroesophageal reflux disease)   ? HLD (hyperlipidemia) 04/07/2019  ? Hypertension   ? Hypothyroidism, adult 04/07/2019  ? Neuropathy   ? Obesity (BMI 30.0-34.9) 04/07/2019  ? Schizoaffective disorder ([place])   ? Type II diabetes mellitus, uncontrolled 04/27/2019  ?  ?Past Surgical History:  ?Procedure Laterality Date  ? BACK SURGERY  09/06/2020  ? BIOPSY  05/03/2021  ? Procedure: BIOPSY;  Surgeon: [name], [name], MD;  [facility];  Service: Gastroenterology;;  ? BREAST SURGERY Right   ? biopsy  ? CESAREAN SECTION    ? CHOLECYSTECTOMY  06/02/2012  ? Procedure: LAPAROSCOPIC CHOLECYSTECTOMY;  Surgeon:  Jamesetta So, MD;  Location: [facility];  Service: General;  Laterality: N/A;  Attempted laparoscopic cholecystectomy  ? CHOLECYSTECTOMY  06/02/2012  ? Procedure: CHOLECYSTECTOMY;  Surgeon: Jamesetta So, MD;  Location: [facility];  Service: General;  Laterality: N/A;  converted to open at  0905  ? COLONOSCOPY WITH PROPOFOL N/A 07/18/2020  ? prep was fair, one 48m polyp (inflammatory) stool in descending colon, transverse and ascending, distal rectum and anal verge normal  ? ESOPHAGEAL DILATION  12/31/2018  ? Procedure: ESOPHAGEAL DILATION;  Surgeon: RRogene Houston MD;   [facility];  Service: Endoscopy;;  ? ESOPHAGOGASTRODUODENOSCOPY (EGD) WITH PROPOFOL N/A 08/24/2015  ? Procedure: ESOPHAGOGASTRODUODENOSCOPY (EGD) WITH PROPOFOL;  Surgeon: NRogene Houston MD;  [facility];  Service: Endoscopy;  Laterality: N/A;  1:10 - Ann to notify pt to arrive at 11:30  ? ESOPHAGOGASTRODUODENOSCOPY (EGD) WITH PROPOFOL N/A 12/31/2018  ? normal, no abnormality to explain dysphagia, esophagus dilated.  ? ESOPHAGOGASTRODUODENOSCOPY (EGD) WITH PROPOFOL N/A 05/03/2021  ? Procedure: ESOPHAGOGASTRODUODENOSCOPY (EGD) WITH PROPOFOL;  Surgeon: CHarvel Quale MD;  [facility];  Service: Gastroenterology;  Laterality: N/A;  1:20  ? FLEXIBLE SIGMOIDOSCOPY  07/17/2020  ? Procedure: FLEXIBLE SIGMOIDOSCOPY;  Surgeon: CMontez Morita DQuillian Quince MD;  [facility];  Service: Gastroenterology;;  ? POLYPECTOMY  07/18/2020  ? Procedure: POLYPECTOMY INTESTINAL;  Surgeon: CMontez Morita DQuillian Quince MD;  [facility];  Service: Gastroenterology;;  ascending colon polyp;   ? SAVORY DILATION  05/03/2021  ? Procedure: SAVORY DILATION;  Surgeon: CMontez Morita DQuillian Quince MD;  [facility];  Service: Gastroenterology;;  ? tooth removal  2019  ? all teeth removed  ? TUBAL LIGATION    ? X3  ? ? ?Family Psychiatric History: See below ? ?Family History:  ?Family History  ?Problem Relation Age of Onset  ? Hypertension Mother   ? Alcohol abuse Mother   ? Heart disease Mother   ? Kidney disease Mother   ? Hypertension Father   ? Alcohol abuse Sister   ? Stroke Other   ? Diabetes Other   ? Cancer Other   ? Seizures Other   ? Alcohol abuse Maternal Aunt   ? Alcohol abuse Paternal Aunt   ? Alcohol abuse Maternal Grandfather   ? Alcohol abuse Maternal Grandmother   ? Alcohol abuse Cousin   ? Hearing loss Daughter   ? ? ?Social History:  ?Social History  ? ?Socioeconomic History  ? Marital status: Married  ?  Spouse name: Not on file  ? Number of children: Not  on file  ? Years of education: Not on file  ? Highest education level: Not on file  ?Occupational History  ? Not on file  ?Tobacco Use  ? Smoking status: Never  ? Smokeless tobacco: Never  ?Vaping Use  ? Vaping Use: Never used  ?Substance and Sexual Activity  ? Alcohol use: No  ?  Alcohol/week: 0.0 standard drinks  ? Drug use: No  ? Sexual activity: Not Currently  ?  Birth control/protection: Surgical  ?Other Topics Concern  ? Not on file  ?Social History Narrative  ? Married for 22 years.On disability secondary to schizophrenia.Lives with husband.  ? ?Social Determinants of Health  ? ?Financial Resource Strain: Not on file  ?Food Insecurity: Not on file  ?Transportation Needs: Not on file  ?Physical Activity: Not on file  ?Stress: Not on file  ?Social Connections: Not on file  ? ? ?Allergies: No  Known Allergies ? ?Metabolic Disorder Labs: ?Lab Results  ?Component Value Date  ? HGBA1C 12.5 (H) 07/30/2021  ? MPG 332 07/30/2020  ? MPG  07/19/2019  ?   Comment:  ?   eAG cannot be calculated. Hemoglobin A1c result ?exceeds the linearity of the assay. ?  ? ?No results found for: PROLACTIN ?Lab Results  ?Component Value Date  ? CHOL 141 07/30/2021  ? TRIG 76 07/30/2021  ? HDL 71 07/30/2021  ? CHOLHDL 2.0 07/30/2021  ? LDLCALC 55 07/30/2021  ? LDLCALC 37 07/30/2020  ? ?Lab Results  ?Component Value Date  ? TSH 0.289 (L) 07/30/2021  ? TSH CANCELED 11/01/2020  ? ? ?Therapeutic Level Labs: ?Lab Results  ?Component Value Date  ? LITHIUM <0.06 (L) 07/03/2016  ? LITHIUM 1.17 09/09/2015  ? ?No results found for: VALPROATE ?No components found for:  CBMZ ? ?Current Medications: ?Current Outpatient Medications  ?Medication Sig Dispense Refill  ? albuterol (VENTOLIN HFA) 108 (90 Base) MCG/ACT inhaler Inhale 2 puffs into the lungs every 6 (six) hours as needed for wheezing or shortness of breath. 8 g 2  ? aspirin EC 81 MG tablet Take 81 mg by mouth daily.    ? atorvastatin (LIPITOR) 10 MG tablet Take 1 tablet (10 mg total) by mouth  at bedtime. 90 tablet 0  ? benztropine (COGENTIN) 1 MG tablet TAKE 1 TABLET(1 MG) BY MOUTH AT BEDTIME 90 tablet 2  ? Blood Glucose Monitoring Suppl (ACCU-CHEK GUIDE ME) w/Device KIT 1 Piece by Does n

## 2021-08-29 ENCOUNTER — Ambulatory Visit (INDEPENDENT_AMBULATORY_CARE_PROVIDER_SITE_OTHER): Payer: BC Managed Care – PPO | Admitting: Nurse Practitioner

## 2021-08-29 ENCOUNTER — Encounter: Payer: Self-pay | Admitting: Nurse Practitioner

## 2021-08-29 VITALS — BP 160/92 | HR 96 | Ht 60.0 in | Wt 164.0 lb

## 2021-08-29 DIAGNOSIS — E039 Hypothyroidism, unspecified: Secondary | ICD-10-CM | POA: Diagnosis not present

## 2021-08-29 DIAGNOSIS — I10 Essential (primary) hypertension: Secondary | ICD-10-CM | POA: Diagnosis not present

## 2021-08-29 DIAGNOSIS — G44229 Chronic tension-type headache, not intractable: Secondary | ICD-10-CM | POA: Diagnosis not present

## 2021-08-29 DIAGNOSIS — R079 Chest pain, unspecified: Secondary | ICD-10-CM | POA: Diagnosis not present

## 2021-08-29 MED ORDER — PROMETHAZINE HCL 25 MG/ML IJ SOLN
25.0000 mg | Freq: Once | INTRAMUSCULAR | Status: AC
  Administered 2021-08-29: 25 mg via INTRAMUSCULAR

## 2021-08-29 MED ORDER — ATORVASTATIN CALCIUM 10 MG PO TABS: 10.0000 mg | ORAL_TABLET | Freq: Every day | ORAL | 0 refills | Status: DC

## 2021-08-29 MED ORDER — DIPHENHYDRAMINE HCL 50 MG/ML IJ SOLN
25.0000 mg | Freq: Once | INTRAMUSCULAR | Status: AC
  Administered 2021-08-29: 25 mg via INTRAMUSCULAR

## 2021-08-29 MED ORDER — SUMATRIPTAN SUCCINATE 50 MG PO TABS: 50.0000 mg | ORAL_TABLET | ORAL | 0 refills | Status: DC | PRN

## 2021-08-29 MED ORDER — KETOROLAC TROMETHAMINE 60 MG/2ML IM SOLN
30.0000 mg | Freq: Once | INTRAMUSCULAR | Status: AC
  Administered 2021-08-29: 30 mg via INTRAMUSCULAR

## 2021-08-29 MED ORDER — AMLODIPINE BESYLATE 2.5 MG PO TABS: 2.5000 mg | ORAL_TABLET | Freq: Every day | ORAL | 2 refills | Status: DC

## 2021-08-29 NOTE — Patient Instructions (Addendum)
Please start taking amlodipine 2.'5mg'$  daily for your blood pressure.  ? ?Stat taking sumatriptan '50mg'$  once daily as needed for your migraine ? ?Please go to the ER if your symptoms does not get after you start taking your new blood pressure medicine. ?Please get your thyroid labs done on Monday as discussed.  ? ? ?It is important that you exercise regularly at least 30 minutes 5 times a week.  ?Think about what you will eat, plan ahead. ?Choose " clean, green, fresh or frozen" over canned, processed or packaged foods which are more sugary, salty and fatty. ?70 to 75% of food eaten should be vegetables and fruit. ?Three meals at set times with snacks allowed between meals, but they must be fruit or vegetables. ?Aim to eat over a 12 hour period , example 7 am to 7 pm, and STOP after  your last meal of the day. ?Drink water,generally about 64 ounces per day, no other drink is as healthy. Fruit juice is best enjoyed in a healthy way, by EATING the fruit. ? ?Thanks for choosing [facility], we consider it a privelige to serve you. ? ?

## 2021-08-29 NOTE — Progress Notes (Signed)
? ?  Kathryn [name]     MRN: [id number]      DOB: [birth year] ? ? ?HPI ?[name] with past medical history of essential hypertension, asthma, IBS, uncontrolled type 2 diabetes, hyperlipidemia, nonadherence to medical treatment is here for follow up for hypertension .the PT denies any adverse reactions to current medications since the last visit.  ? ?Pt c/o of stabbing chest  pain 8/10  under her left chest ,states that pain is intermittent, started 2 weeks ago and it is associated with SOB, has chronic migraine HA. Pt states that her CP pain last for 5 minutes when it comes, denies syncope,  ? ?Patient stated that she has chronic constant migraine HA , she rates her headache as 10/10 , HA located to the center of her head  and frontal part of her HA, has no  changes in her vision , had nausea yesterday.  Denies vomiting Laying down in the dark makes her HA better.  States that she used to see the neurologist for her chronic headache but not anymore. ? ? ? ?ROS ?Denies recent fever or chills. ?Denies sinus pressure, nasal congestion, ear pain or sore throat. ?Denies chest congestion, productive cough or wheezing. ?Has chest pains, no palpitations and leg swelling ?Denies abdominal pain, nausea, vomiting,diarrhea or constipation.   ?Has chronic  headaches, denies seizures, numbness, or tingling. ?Denies depression, anxiety or insomnia. ? ? ?PE ? ?BP (!) 160/92   Pulse 96   Ht 5' (1.524 m)   Wt 164 lb (74.4 kg)   LMP 01/23/2013   SpO2 97%   BMI 32.03 kg/m?  ? ?Patient alert and oriented and in no cardiopulmonary distress. ? ?HEENT: No facial asymmetry, EOMI,     Neck supple . ? ?Chest: Clear to auscultation bilaterally. ? ?CVS: S1, S2 no murmurs, no S3.Regular rate.  Normal sinus rhythm on EKG ? ?MS: Adequate ROM spine, shoulders, hips and knees, left wall chest tenderness on palpation ? ?Psych: Good eye contact, normal affect. Memory intact not anxious or depressed appearing. ? ?CNS: CN 2-12 intact, power,   normal throughout.no focal deficits noted. ? ? ?Assessment & Plan ? ?Essential hypertension, benign ?BP Readings from Last 3 Encounters:  ?08/29/21 (!) 160/92  ?08/19/21 (!) 148/96  ?08/05/21 (!) 158/92  ?Chronic condition uncontrolled ?On Coreg 12.5 mg twice daily, losartan 100 mg daily ?Start taking amlodipine 2.5 mg daily ?Dash diet advised need to engage in regular vigorous exercise at least 150 minutes a week discussed ?Monitor blood pressure at home ,follow-up in 4 weeks to recheck blood pressure ? ?Chronic tension-type headache, not intractable ?Given Benadryl 25 mg IM , Toradol 30 mg IM , promethazine 25 mg IM in the office today ?Rx sumatriptan 50 mg as needed, take OTC Tylenol as needed ?Patient referred to neurology.  ? ?Chest pain ?EKG showed normal sinus rhythm ?No concern for ischemia ?Chest wall tenderness on palpation ?Pain musculoskeletal in nature ?Toradol injection given in the office today ?Patient told to take OTC Tylenol as needed. ? ?Hypothyroidism ?Lab Results  ?Component Value Date  ? TSH 0.289 (L) 07/30/2021  ? ?Synthroid was recently stopped due to overcorrection. ?Patient encouraged to get her thyroid function labs done ?Will restart medication if needed when labs are back  ?

## 2021-08-29 NOTE — Assessment & Plan Note (Addendum)
Lab Results  ?Component Value Date  ? TSH 0.289 (L) 07/30/2021  ? ?Synthroid was recently stopped due to overcorrection. ?Patient encouraged to get her thyroid function labs done ?Will restart medication if needed when labs are back ?

## 2021-08-29 NOTE — Assessment & Plan Note (Signed)
BP Readings from Last 3 Encounters:  ?08/29/21 (!) 160/92  ?08/19/21 (!) 148/96  ?08/05/21 (!) 158/92  ?Chronic condition uncontrolled ?On Coreg 12.5 mg twice daily, losartan 100 mg daily ?Start taking amlodipine 2.5 mg daily ?Dash diet advised need to engage in regular vigorous exercise at least 150 minutes a week discussed ?Monitor blood pressure at home ,follow-up in 4 weeks to recheck blood pressure ?

## 2021-08-29 NOTE — Assessment & Plan Note (Signed)
EKG showed normal sinus rhythm ?No concern for ischemia ?Chest wall tenderness on palpation ?Pain musculoskeletal in nature ?Toradol injection given in the office today ?Patient told to take OTC Tylenol as needed. ?

## 2021-08-29 NOTE — Assessment & Plan Note (Addendum)
Given Benadryl 25 mg IM , Toradol 30 mg IM , promethazine 25 mg IM in the office today ?Rx sumatriptan 50 mg as needed, take OTC Tylenol as needed ?Patient referred to neurology.  ?

## 2021-08-31 ENCOUNTER — Other Ambulatory Visit: Payer: Self-pay | Admitting: "Endocrinology

## 2021-08-31 ENCOUNTER — Other Ambulatory Visit: Payer: Self-pay | Admitting: Nurse Practitioner

## 2021-08-31 DIAGNOSIS — E039 Hypothyroidism, unspecified: Secondary | ICD-10-CM

## 2021-08-31 DIAGNOSIS — E559 Vitamin D deficiency, unspecified: Secondary | ICD-10-CM

## 2021-08-31 DIAGNOSIS — E782 Mixed hyperlipidemia: Secondary | ICD-10-CM

## 2021-08-31 DIAGNOSIS — E1165 Type 2 diabetes mellitus with hyperglycemia: Secondary | ICD-10-CM

## 2021-08-31 DIAGNOSIS — I10 Essential (primary) hypertension: Secondary | ICD-10-CM

## 2021-08-31 DIAGNOSIS — E1143 Type 2 diabetes mellitus with diabetic autonomic (poly)neuropathy: Secondary | ICD-10-CM

## 2021-09-03 DIAGNOSIS — I10 Essential (primary) hypertension: Secondary | ICD-10-CM | POA: Diagnosis not present

## 2021-09-03 DIAGNOSIS — E039 Hypothyroidism, unspecified: Secondary | ICD-10-CM | POA: Diagnosis not present

## 2021-09-04 LAB — TSH+FREE T4
Free T4: 1.6 ng/dL (ref 0.82–1.77)
TSH: 1.36 u[IU]/mL (ref 0.450–4.500)

## 2021-09-04 LAB — BASIC METABOLIC PANEL
BUN/Creatinine Ratio: 19 (ref 9–23)
BUN: 17 mg/dL (ref 6–24)
CO2: 27 mmol/L (ref 20–29)
Calcium: 9.9 mg/dL (ref 8.7–10.2)
Chloride: 101 mmol/L (ref 96–106)
Creatinine, Ser: 0.89 mg/dL (ref 0.57–1.00)
Glucose: 96 mg/dL (ref 70–99)
Potassium: 3.9 mmol/L (ref 3.5–5.2)
Sodium: 142 mmol/L (ref 134–144)
eGFR: 79 mL/min/{1.73_m2} (ref >59)

## 2021-09-04 NOTE — Progress Notes (Signed)
Normal thyroid labs.

## 2021-09-04 NOTE — Progress Notes (Signed)
Kidney function, electrolytes stable patient should continue to take medications as ordered and follow-up as planned

## 2021-09-10 ENCOUNTER — Ambulatory Visit (INDEPENDENT_AMBULATORY_CARE_PROVIDER_SITE_OTHER): Payer: BC Managed Care – PPO | Admitting: Gastroenterology

## 2021-09-10 ENCOUNTER — Encounter (INDEPENDENT_AMBULATORY_CARE_PROVIDER_SITE_OTHER): Payer: Self-pay | Admitting: Gastroenterology

## 2021-09-10 ENCOUNTER — Other Ambulatory Visit (INDEPENDENT_AMBULATORY_CARE_PROVIDER_SITE_OTHER): Payer: Self-pay | Admitting: Gastroenterology

## 2021-09-10 VITALS — BP 174/94 | HR 107 | Temp 98.4°F | Ht 60.0 in | Wt 160.3 lb

## 2021-09-10 DIAGNOSIS — K30 Functional dyspepsia: Secondary | ICD-10-CM | POA: Diagnosis not present

## 2021-09-10 DIAGNOSIS — R112 Nausea with vomiting, unspecified: Secondary | ICD-10-CM | POA: Diagnosis not present

## 2021-09-10 DIAGNOSIS — K219 Gastro-esophageal reflux disease without esophagitis: Secondary | ICD-10-CM | POA: Diagnosis not present

## 2021-09-10 MED ORDER — ONDANSETRON HCL 4 MG PO TABS: 4.0000 mg | ORAL_TABLET | Freq: Three times a day (TID) | ORAL | 0 refills | Status: DC | PRN

## 2021-09-10 MED ORDER — SUCRALFATE 1 GM/10ML PO SUSP: 1.0000 g | Freq: Three times a day (TID) | ORAL | 1 refills | Status: DC

## 2021-09-10 MED ORDER — OMEPRAZOLE 40 MG PO CPDR: 40.0000 mg | DELAYED_RELEASE_CAPSULE | Freq: Two times a day (BID) | ORAL | 3 refills | Status: DC

## 2021-09-10 NOTE — Patient Instructions (Addendum)
Stop protonix ?Start omeprazole 40 mg BID, take one 30 minutes prior to breakfast and the second 30 minutes prior to dinner ?I have also sent Carafate 1g for you to take 3 times a day before meals and at bedtime ?Zofran '4mg'$  for nausea, you can take this up to every 8 hours but be aware that it can cause constipation if taken frequently so try to use as sparingly as possible ? ?Call with report in 1-2 weeks to let me know how symptoms are doing. We will discuss further evaluation with possible imaging if symptoms are not improved ? ?Follow up 3 months  ?

## 2021-09-10 NOTE — Progress Notes (Signed)
? ?Referring Provider: Renee Rival, FNP ?Primary Care Physician:  Renee Rival, FNP ?Primary GI Physician: Jenetta Downer ? ?Chief Complaint  ?Patient presents with  ? Abdominal Pain  ?  Patient here today for upper abdominal pain on going for the last two weeks. She states she is having nausea and vomiting even with water she vomits it up.  ? ?HPI:   ?Kathryn [name] is a 51 y.o. female with past medical history of  anxiety, depression, asthma, GERD, HLD, HTN, Hypothyroidism, schizoaffective disorder, IBS-C, DM. ?  ?Patient presenting today for upper abdominal pain x2 weeks with nausea and vomiting ? ?Last seen 04/23/21 for abdominal/epigastric pain, dysphagia and melena. At that time reproted epigastric painx 2 weeks since running out of her PPI. Abd pain worse after eating, denied NSAIDs or ETOH, having dysphagia with solid foods, no nausea or vomiting. Taking linzess 278mg at that time, having 1-2 BMs per week. continued on linzess and miralax once a day, EGD scheduled for further evaluation with presence of gastritis, protonix increased to BID dosing x3 months and advised to avoid NSAIDs. H pylori negative ? ?Today, States that she started having upper abdominal pain with nausea 2 weeks ago, similar in nature to previous epigastric/upper abdominal pain, prior to last EGD. Denies heartburn or indigestion, is taking protonix BID. Denies diarrhea or constipation. Denies rectal bleeding or melena. Denies any NSAID or alcohol use. States that appetite has not been good, pain is worse after eating. She is trying to eat more bland foods though this did not seem to help her symptoms. She states that she has trouble keeping food and water down, will feel like she needs to vomit, states that pain is actually worse after that. She denies any fevers or chills. She denies any new medications, states that blood sugars have been good, around 100. Has taken otc antacid PRN with some relief of symptoms periodically.  Denies rectal bleeding, melena, diarrhea, or constipation. No recent sick contacts.  ? ?Last Colonoscopy:Last Colonoscopy:07/18/20 - Preparation of the colon was fair. ?- One 4 mm polyp in the ascending colon, (inflammatory polyp) ?- Stool in the descending colon, in the transverse colon and in the ascending colon. ?- The distal rectum and anal verge are normal on retroflexion view. ?Last Endoscopy:-05/03/21  No endoscopic esophageal abnormality to explain patient's dysphagia. Esophagus dilated. ?- Gastritis. Biopsied.Erythematous duodenopathy. Biopsied. No H pylori, normal biopsies. ? ?Past Medical History:  ?Diagnosis Date  ? Anemia   ? Anxiety   ? Asthma   ? Depression   ? GERD (gastroesophageal reflux disease)   ? HLD (hyperlipidemia) 04/07/2019  ? Hypertension   ? Hypothyroidism, adult 04/07/2019  ? Neuropathy   ? Obesity (BMI 30.0-34.9) 04/07/2019  ? [place]   ? Type II diabetes mellitus, uncontrolled 04/27/2019  ? ? ?Past Surgical History:  ?Procedure Laterality Date  ? BACK SURGERY  09/06/2020  ? BIOPSY  05/03/2021  ? Procedure: BIOPSY;  Surgeon: CMontez Morita DQuillian Quince MD;  [facility];  Service: Gastroenterology;;  ? BREAST SURGERY Right   ? biopsy  ? CESAREAN SECTION    ? CHOLECYSTECTOMY  06/02/2012  ? Procedure: LAPAROSCOPIC CHOLECYSTECTOMY;  Surgeon: MJamesetta So MD;  Location: [facility];  Service: General;  Laterality: N/A;  Attempted laparoscopic cholecystectomy  ? CHOLECYSTECTOMY  06/02/2012  ? Procedure: CHOLECYSTECTOMY;  Surgeon: MJamesetta So MD;  Location: [facility];  Service: General;  Laterality: N/A;  converted to open at  0905  ? COLONOSCOPY  WITH PROPOFOL N/A 07/18/2020  ? prep was fair, one 75m polyp (inflammatory) stool in descending colon, transverse and ascending, distal rectum and anal verge normal  ? ESOPHAGEAL DILATION  12/31/2018  ? Procedure: ESOPHAGEAL DILATION;  Surgeon: RRogene Houston MD;  [facility];  Service: Endoscopy;;  ?  ESOPHAGOGASTRODUODENOSCOPY (EGD) WITH PROPOFOL N/A 08/24/2015  ? Procedure: ESOPHAGOGASTRODUODENOSCOPY (EGD) WITH PROPOFOL;  Surgeon: NRogene Houston MD;  [facility];  Service: Endoscopy;  Laterality: N/A;  1:10 - Ann to notify pt to arrive at 11:30  ? ESOPHAGOGASTRODUODENOSCOPY (EGD) WITH PROPOFOL N/A 12/31/2018  ? normal, no abnormality to explain dysphagia, esophagus dilated.  ? ESOPHAGOGASTRODUODENOSCOPY (EGD) WITH PROPOFOL N/A 05/03/2021  ? Procedure: ESOPHAGOGASTRODUODENOSCOPY (EGD) WITH PROPOFOL;  Surgeon: CHarvel Quale MD;  [facility];  Service: Gastroenterology;  Laterality: N/A;  1:20  ? FLEXIBLE SIGMOIDOSCOPY  07/17/2020  ? Procedure: FLEXIBLE SIGMOIDOSCOPY;  Surgeon: CMontez Morita DQuillian Quince MD;  [facility];  Service: Gastroenterology;;  ? POLYPECTOMY  07/18/2020  ? Procedure: POLYPECTOMY INTESTINAL;  Surgeon: CMontez Morita DQuillian Quince MD;  [facility];  Service: Gastroenterology;;  ascending colon polyp;   ? SAVORY DILATION  05/03/2021  ? Procedure: SAVORY DILATION;  Surgeon: CMontez Morita DQuillian Quince MD;  [facility];  Service: Gastroenterology;;  ? tooth removal  2019  ? all teeth removed  ? TUBAL LIGATION    ? X3  ? ? ?Current Outpatient Medications  ?Medication Sig Dispense Refill  ? albuterol (VENTOLIN HFA) 108 (90 Base) MCG/ACT inhaler Inhale 2 puffs into the lungs every 6 (six) hours as needed for wheezing or shortness of breath. 8 g 2  ? amLODipine (NORVASC) 2.5 MG tablet TAKE 1 TABLET BY MOUTH EVERY DAY 60 tablet 0  ? aspirin EC 81 MG tablet Take 81 mg by mouth daily.    ? atorvastatin (LIPITOR) 10 MG tablet TAKE 1 TABLET BY MOUTH EVERYDAY AT BEDTIME 90 tablet 0  ? benztropine (COGENTIN) 1 MG tablet TAKE 1 TABLET(1 MG) BY MOUTH AT BEDTIME 90 tablet 2  ? Blood Glucose Monitoring Suppl (ACCU-CHEK GUIDE ME) w/Device KIT 1 Piece by Does not apply route as directed. 1 kit 0  ? budesonide-formoterol (SYMBICORT) 80-4.5  MCG/ACT inhaler Inhale 2 puffs into the lungs 2 (two) times daily. 1 each 3  ? carvedilol (COREG) 12.5 MG tablet Take 1 tablet (12.5 mg total) by mouth 2 (two) times daily. 180 tablet 0  ? Cholecalciferol (VITAMIN D-3) 125 MCG (5000 UT) TABS Take 2 tablets by mouth daily. 30 tablet 1  ? clonazePAM (KLONOPIN) 0.5 MG tablet Take 1 tablet (0.5 mg total) by mouth 2 (two) times daily as needed for anxiety. 180 tablet 2  ? Dulaglutide (TRULICITY) 3 MZO/1.0RUSOPN Inject 3 mg as directed once a week. 2 mL 2  ? DULoxetine (CYMBALTA) 60 MG capsule Take 1 capsule (60 mg total) by mouth 2 (two) times daily. 180 capsule 2  ? gabapentin (NEURONTIN) 300 MG capsule TAKE 1 CAPSULE BY MOUTH EVERY DAY 60 capsule 1  ? glipiZIDE (GLUCOTROL XL) 5 MG 24 hr tablet Take 180mdaily 90 tablet 0  ? glucose blood (ACCU-CHEK GUIDE) test strip Use as instructed 150 each 2  ? insulin glargine, 2 Unit Dial, (TOUJEO MAX SOLOSTAR) 300 UNIT/ML Solostar Pen Inject 80 Units into the skin at bedtime. 9 mL 2  ? levothyroxine (SYNTHROID) 75 MCG tablet Take 75 mcg by mouth daily before breakfast.    ? linaclotide (LRolan Lipa  290 MCG CAPS capsule Take 1 capsule (290 mcg total) by mouth daily before breakfast. 90 capsule 3  ? losartan (COZAAR) 100 MG tablet TAKE 1 TABLET BY MOUTH EVERY DAY 90 tablet 0  ? Multiple Vitamin (MULTIVITAMIN) tablet Take 1 tablet by mouth daily.    ? pantoprazole (PROTONIX) 40 MG tablet Take 1 tablet (40 mg total) by mouth 2 (two) times daily. 180 tablet 0  ? Polyvinyl Alcohol-Povidone PF (REFRESH) 1.4-0.6 % SOLN Place 1 drop into both eyes daily as needed (dry eyes).    ? risperiDONE (RISPERDAL) 2 MG tablet TAKE 1 TABLET EVERY MORNING AND 3 TABLETS AT BEDTIME 360 tablet 3  ? SUMAtriptan (IMITREX) 50 MG tablet Take 1 tablet (50 mg total) by mouth every 2 (two) hours as needed for migraine. May repeat in 2 hours if headache persists or recurs. 10 tablet 0  ? traZODone (DESYREL) 100 MG tablet TAKE 2 TABLETS(200 MG) BY MOUTH AT BEDTIME  180 tablet 2  ? ?No current facility-administered medications for this visit.  ? ? ?Allergies as of 09/10/2021  ? (No Known Allergies)  ? ? ?Family History  ?Problem Relation Age of Onset  ? Hypertension Mother

## 2021-09-18 ENCOUNTER — Encounter: Payer: Self-pay | Admitting: Cardiology

## 2021-09-18 ENCOUNTER — Ambulatory Visit (INDEPENDENT_AMBULATORY_CARE_PROVIDER_SITE_OTHER): Payer: BC Managed Care – PPO | Admitting: Cardiology

## 2021-09-18 VITALS — BP 155/90 | HR 96 | Ht 62.0 in | Wt 161.4 lb

## 2021-09-18 DIAGNOSIS — E119 Type 2 diabetes mellitus without complications: Secondary | ICD-10-CM

## 2021-09-18 DIAGNOSIS — I10 Essential (primary) hypertension: Secondary | ICD-10-CM

## 2021-09-18 DIAGNOSIS — Z794 Long term (current) use of insulin: Secondary | ICD-10-CM

## 2021-09-18 DIAGNOSIS — E782 Mixed hyperlipidemia: Secondary | ICD-10-CM

## 2021-09-18 DIAGNOSIS — R0789 Other chest pain: Secondary | ICD-10-CM

## 2021-09-18 MED ORDER — AMLODIPINE BESYLATE 5 MG PO TABS: 5.0000 mg | ORAL_TABLET | Freq: Every day | ORAL | 3 refills | Status: DC

## 2021-09-18 NOTE — Patient Instructions (Signed)
Medication Instructions:  ?Your physician has recommended you make the following change in your medication:  ?Increase amlodipine to 5 mg once a day ?Continue all other medications as directed ? ?Labwork: ?none ? ?Testing/Procedures: ?none ? ?Follow-Up: ?Your physician recommends that you schedule a follow-up appointment in: 1 year ? ?Any Other Special Instructions Will Be Listed Below (If Applicable). ? ?You will receive a reminder call in about 10 months reminding you to schedule your appointment. If you don't receive this call, please contact our office. ? ? ?If you need a refill on your cardiac medications before your next appointment, please call your pharmacy. ? ?

## 2021-09-18 NOTE — Progress Notes (Signed)
? ? ? ?Clinical Summary ?Ms. Fadness is a 51 y.o.female seen for follow up of the following medical problems.  ?  ?1. Chest pain ?01/2017 Echo LVEF 60-65%, no WMAs, small effusion. Diastolic function not described. By reported parameters abnormal indeterminate grade.  ?03/2017 nuclear stress no ischemia.  ? ?  ? 07/2020 nuclear stress: no ischemia.  ? ?- left sided pain, sharp pain. Can occur at rest or exertion. Worst with bending over. Lasts about 5-10 minutes. Consistent with her chronic chest pain symptoms ?  ?2. HTN ?- compliant with meds ?- yesterday pcp visit 110/78. Just took meds this AM ? ?- pcp started norvasc 2.31m daily.  ? ?  ?  ?3. Hyperlipidemia ?- 07/2020 TC 107 HDL 53 TG 89 LDL 37 ?- 07/2021 TC 141 TG 76 HDL 71 LDL 55 ?- she is on atorvatstatin 149m?  ?4. DM2 ?- poorly controlled, recent Hgb A1c 12.5 ?- followed by pcp and endo ? ?5. Dysphagia/epigastric pain ?- followed by GI ?Past Medical History:  ?Diagnosis Date  ? Anemia   ? Anxiety   ? Asthma   ? Depression   ? GERD (gastroesophageal reflux disease)   ? HLD (hyperlipidemia) 04/07/2019  ? Hypertension   ? Hypothyroidism, adult 04/07/2019  ? Neuropathy   ? Obesity (BMI 30.0-34.9) 04/07/2019  ? Schizoaffective disorder ([place]  ? Type II diabetes mellitus, uncontrolled 04/27/2019  ? ? ? ?No Known Allergies ? ? ?Current Outpatient Medications  ?Medication Sig Dispense Refill  ? albuterol (VENTOLIN HFA) 108 (90 Base) MCG/ACT inhaler Inhale 2 puffs into the lungs every 6 (six) hours as needed for wheezing or shortness of breath. 8 g 2  ? aspirin EC 81 MG tablet Take 81 mg by mouth daily.    ? atorvastatin (LIPITOR) 10 MG tablet TAKE 1 TABLET BY MOUTH EVERYDAY AT BEDTIME 90 tablet 0  ? benztropine (COGENTIN) 1 MG tablet TAKE 1 TABLET(1 MG) BY MOUTH AT BEDTIME 90 tablet 2  ? budesonide-formoterol (SYMBICORT) 80-4.5 MCG/ACT inhaler Inhale 2 puffs into the lungs 2 (two) times daily. 1 each 3  ? carvedilol (COREG) 12.5 MG tablet Take 1 tablet (12.5 mg  total) by mouth 2 (two) times daily. 180 tablet 0  ? Cholecalciferol (VITAMIN D-3) 125 MCG (5000 UT) TABS Take 2 tablets by mouth daily. 30 tablet 1  ? clonazePAM (KLONOPIN) 0.5 MG tablet Take 1 tablet (0.5 mg total) by mouth 2 (two) times daily as needed for anxiety. 180 tablet 2  ? Dulaglutide (TRULICITY) 3 MGGN/5.6OZOPN Inject 3 mg as directed once a week. 2 mL 2  ? DULoxetine (CYMBALTA) 60 MG capsule Take 1 capsule (60 mg total) by mouth 2 (two) times daily. 180 capsule 2  ? gabapentin (NEURONTIN) 300 MG capsule TAKE 1 CAPSULE BY MOUTH EVERY DAY 60 capsule 1  ? glipiZIDE (GLUCOTROL XL) 5 MG 24 hr tablet Take 1076maily 90 tablet 0  ? levothyroxine (SYNTHROID) 75 MCG tablet Take 75 mcg by mouth daily before breakfast.    ? linaclotide (LINZESS) 290 MCG CAPS capsule Take 1 capsule (290 mcg total) by mouth daily before breakfast. 90 capsule 3  ? losartan (COZAAR) 100 MG tablet TAKE 1 TABLET BY MOUTH EVERY DAY 90 tablet 0  ? Multiple Vitamin (MULTIVITAMIN) tablet Take 1 tablet by mouth daily.    ? omeprazole (PRILOSEC) 40 MG capsule TAKE 1 CAPSULE BY MOUTH TWICE A DAY 60 capsule 3  ? ondansetron (ZOFRAN) 4 MG tablet Take 1 tablet (4 mg  total) by mouth every 8 (eight) hours as needed for nausea or vomiting. 30 tablet 0  ? Polyvinyl Alcohol-Povidone PF (REFRESH) 1.4-0.6 % SOLN Place 1 drop into both eyes daily as needed (dry eyes).    ? risperiDONE (RISPERDAL) 2 MG tablet TAKE 1 TABLET EVERY MORNING AND 3 TABLETS AT BEDTIME 360 tablet 3  ? sucralfate (CARAFATE) 1 GM/10ML suspension Take 10 mLs (1 g total) by mouth 4 (four) times daily -  with meals and at bedtime. 420 mL 1  ? SUMAtriptan (IMITREX) 50 MG tablet Take 1 tablet (50 mg total) by mouth every 2 (two) hours as needed for migraine. May repeat in 2 hours if headache persists or recurs. 10 tablet 0  ? traZODone (DESYREL) 100 MG tablet TAKE 2 TABLETS(200 MG) BY MOUTH AT BEDTIME 180 tablet 2  ? amLODipine (NORVASC) 5 MG tablet Take 1 tablet (5 mg total) by mouth  daily. 90 tablet 3  ? Blood Glucose Monitoring Suppl (ACCU-CHEK GUIDE ME) w/Device KIT 1 Piece by Does not apply route as directed. 1 kit 0  ? glucose blood (ACCU-CHEK GUIDE) test strip Use as instructed 150 each 2  ? insulin glargine, 2 Unit Dial, (TOUJEO MAX SOLOSTAR) 300 UNIT/ML Solostar Pen Inject 80 Units into the skin at bedtime. 9 mL 2  ? ?No current facility-administered medications for this visit.  ? ? ? ?Past Surgical History:  ?Procedure Laterality Date  ? BACK SURGERY  09/06/2020  ? BIOPSY  05/03/2021  ? Procedure: BIOPSY;  Surgeon: [name], [name], MD;  Location: [facility];  Service: Gastroenterology;;  ? BREAST SURGERY Right   ? biopsy  ? CESAREAN SECTION    ? CHOLECYSTECTOMY  06/02/2012  ? Procedure: LAPAROSCOPIC CHOLECYSTECTOMY;  Surgeon: Jamesetta So, MD;  Location: [facility];  Service: General;  Laterality: N/A;  Attempted laparoscopic cholecystectomy  ? CHOLECYSTECTOMY  06/02/2012  ? Procedure: CHOLECYSTECTOMY;  Surgeon: Jamesetta So, MD;  Location: [facility];  Service: General;  Laterality: N/A;  converted to open at  0905  ? COLONOSCOPY WITH PROPOFOL N/A 07/18/2020  ? prep was fair, one 69m polyp (inflammatory) stool in descending colon, transverse and ascending, distal rectum and anal verge normal  ? ESOPHAGEAL DILATION  12/31/2018  ? Procedure: ESOPHAGEAL DILATION;  Surgeon: RRogene Houston MD;  Location: [facility];  Service: Endoscopy;;  ? ESOPHAGOGASTRODUODENOSCOPY (EGD) WITH PROPOFOL N/A 08/24/2015  ? Procedure: ESOPHAGOGASTRODUODENOSCOPY (EGD) WITH PROPOFOL;  Surgeon: NRogene Houston MD;  [facility]: [facility];  Service: Endoscopy;  Laterality: N/A;  1:10 - Ann to notify pt to arrive at 11:30  ? ESOPHAGOGASTRODUODENOSCOPY (EGD) WITH PROPOFOL N/A 12/31/2018  ? normal, no abnormality to explain dysphagia, esophagus dilated.  ? ESOPHAGOGASTRODUODENOSCOPY (EGD) WITH PROPOFOL N/A 05/03/2021  ? Procedure: ESOPHAGOGASTRODUODENOSCOPY (EGD) WITH PROPOFOL;  Surgeon: CHarvel Quale MD;  [facility]: [facility];  Service: Gastroenterology;  Laterality: N/A;  1:20  ? FLEXIBLE SIGMOIDOSCOPY  07/17/2020  ? Procedure: FLEXIBLE SIGMOIDOSCOPY;  Surgeon: CMontez Morita DQuillian Quince MD;  [facility]: [facility];  Service: Gastroenterology;;  ? POLYPECTOMY  07/18/2020  ? Procedure: POLYPECTOMY INTESTINAL;  Surgeon: CMontez Morita DQuillian Quince MD;  [facility]: [facility];  Service: Gastroenterology;;  ascending colon polyp;   ? SAVORY DILATION  05/03/2021  ? Procedure: SAVORY DILATION;  Surgeon: CMontez Morita DQuillian Quince MD;  Location: [facility];  Service: Gastroenterology;;  ? tooth removal  2019  ? all teeth removed  ? TUBAL LIGATION    ? X3  ? ? ? ?  No Known Allergies ? ? ? ?Family History  ?Problem Relation Age of Onset  ? Hypertension Mother   ? Alcohol abuse Mother   ? Heart disease Mother   ? Kidney disease Mother   ? Hypertension Father   ? Alcohol abuse Sister   ? Stroke Other   ? Diabetes Other   ? Cancer Other   ? Seizures Other   ? Alcohol abuse Maternal Aunt   ? Alcohol abuse Paternal Aunt   ? Alcohol abuse Maternal Grandfather   ? Alcohol abuse Maternal Grandmother   ? Alcohol abuse Cousin   ? Hearing loss Daughter   ? ? ? ?Social History ?Ms. Bittman reports that she has never smoked. She has never used smokeless tobacco. ?Ms. Lievanos reports no history of alcohol use. ? ? ?Review of Systems ?CONSTITUTIONAL: No weight loss, fever, chills, weakness or fatigue.  ?HEENT: Eyes: No visual loss, blurred vision, double vision or yellow sclerae.No hearing loss, sneezing, congestion, runny nose or sore throat.  ?SKIN: No rash or itching.  ?CARDIOVASCULAR: per hpi ?RESPIRATORY: No shortness of breath, cough or sputum.  ?GASTROINTESTINAL: No anorexia, nausea, vomiting or diarrhea. No abdominal pain or blood.  ?GENITOURINARY: No burning on urination, no polyuria ?NEUROLOGICAL: No headache, dizziness, syncope, paralysis, ataxia, numbness or tingling in the extremities. No change in  bowel or bladder control.  ?MUSCULOSKELETAL: No muscle, back pain, joint pain or stiffness.  ?LYMPHATICS: No enlarged nodes. No history of splenectomy.  ?PSYCHIATRIC: No history of depression or anxiety.  ?[place]

## 2021-09-20 ENCOUNTER — Telehealth: Payer: Self-pay | Admitting: "Endocrinology

## 2021-09-20 DIAGNOSIS — E113293 Type 2 diabetes mellitus with mild nonproliferative diabetic retinopathy without macular edema, bilateral: Secondary | ICD-10-CM | POA: Diagnosis not present

## 2021-09-20 DIAGNOSIS — H52223 Regular astigmatism, bilateral: Secondary | ICD-10-CM | POA: Diagnosis not present

## 2021-09-20 DIAGNOSIS — H524 Presbyopia: Secondary | ICD-10-CM | POA: Diagnosis not present

## 2021-09-20 LAB — HM DIABETES EYE EXAM

## 2021-09-20 NOTE — Telephone Encounter (Signed)
Left a message requesting pt return call to the office. ?

## 2021-09-20 NOTE — Telephone Encounter (Signed)
Patient called with Readings: ? ?Yesterday at 5 pm it was 198, last night at 8 pm it was 276 and this morning it is 171 ?

## 2021-09-23 NOTE — Telephone Encounter (Signed)
Left a message requesting pt return call to the office. ?

## 2021-09-24 ENCOUNTER — Ambulatory Visit (INDEPENDENT_AMBULATORY_CARE_PROVIDER_SITE_OTHER): Payer: BC Managed Care – PPO | Admitting: Gastroenterology

## 2021-09-24 NOTE — Telephone Encounter (Signed)
Informed pt of Dr Dorris Fetch message. ?

## 2021-10-01 ENCOUNTER — Telehealth (INDEPENDENT_AMBULATORY_CARE_PROVIDER_SITE_OTHER): Payer: Self-pay

## 2021-10-01 NOTE — Telephone Encounter (Signed)
Patient called and says none of the medications we prescribe were working. She took [place] 290 once per day and and still having issues with constipation, and Carafate four times per day, and Omeprazole bid and still having issues with Jerrye Bushy. Please advise.  ?

## 2021-10-02 NOTE — Telephone Encounter (Signed)
No patient is not taking Miralax with her linzess. Yes she is having reflux and epigastric pain.  ?

## 2021-10-03 ENCOUNTER — Encounter: Payer: Self-pay | Admitting: Nurse Practitioner

## 2021-10-03 ENCOUNTER — Ambulatory Visit (INDEPENDENT_AMBULATORY_CARE_PROVIDER_SITE_OTHER): Payer: BC Managed Care – PPO | Admitting: Nurse Practitioner

## 2021-10-03 DIAGNOSIS — M79674 Pain in right toe(s): Secondary | ICD-10-CM | POA: Diagnosis not present

## 2021-10-03 DIAGNOSIS — E1165 Type 2 diabetes mellitus with hyperglycemia: Secondary | ICD-10-CM

## 2021-10-03 DIAGNOSIS — Z794 Long term (current) use of insulin: Secondary | ICD-10-CM

## 2021-10-03 DIAGNOSIS — I10 Essential (primary) hypertension: Secondary | ICD-10-CM

## 2021-10-03 DIAGNOSIS — G629 Polyneuropathy, unspecified: Secondary | ICD-10-CM | POA: Diagnosis not present

## 2021-10-03 MED ORDER — AMLODIPINE BESYLATE 2.5 MG PO TABS: 2.5000 mg | ORAL_TABLET | Freq: Every day | ORAL | 0 refills | Status: DC

## 2021-10-03 NOTE — Assessment & Plan Note (Signed)
Chronic condition on gabapentin 300 mg daily. ?Continue current medication ?Need to examine her foot daily and report any ulcer or wound discussed with patient, patient encouraged to wear comfortable shoes.  She verbalized understanding ?

## 2021-10-03 NOTE — Patient Instructions (Addendum)
Goal for fasting blood sugar ranges from 80 to 120 and 2 hours after any meal or at bedtime should be between 130 to 170.  ? ? ?It is important that you exercise regularly at least 30 minutes 5 times a week.  ?Think about what you will eat, plan ahead. ?Choose " clean, green, fresh or frozen" over canned, processed or packaged foods which are more sugary, salty and fatty. ?70 to 75% of food eaten should be vegetables and fruit. ?Three meals at set times with snacks allowed between meals, but they must be fruit or vegetables. ?Aim to eat over a 12 hour period , example 7 am to 7 pm, and STOP after  your last meal of the day. ?Drink water,generally about 64 ounces per day, no other drink is as healthy. Fruit juice is best enjoyed in a healthy way, by EATING the fruit. ? ?Thanks for choosing [facility], we consider it a privelige to serve you.  ?

## 2021-10-03 NOTE — Assessment & Plan Note (Addendum)
She has reestablished care with endocrinology does not want to go back to Dr. Dorris Fetch ?Recently started on Trulicity 3 mg once weekly injection. on glipizide 10 mg daily, Toujeo 80 units daily at bedtime.  Stated that she has been taking medications as prescribed, denies hypoglycemia ?She did not bring her glucometer today , She has been walking almost everyday  ?checking CBG twice daily AM 140's-150's, PM 202.  ?Avoid sugar sweets soda continue to exercise daily at least 150 minutes weekly ?Patient referred to a new endocrinologist today ?Diabetic foot exam completed-patient would benefit from using a diabetic shoes, patient will discuss this with podiatry. ? ?

## 2021-10-03 NOTE — Assessment & Plan Note (Addendum)
BP Readings from Last 3 Encounters:  ?10/03/21 122/90  ?09/18/21 (!) 155/90  ?09/10/21 (!) 174/94  ?Started on amlodipine 2.5 mg daily at last visit, she has been taking medication daily also on Coreg 12.5 mg twice daily, losartan 100 mg daily ?Blood pressure well controlled today ?Continue current medications ?DASH diet advised, engage in regular daily exercises at least 150 minutes weekly. ?

## 2021-10-03 NOTE — Assessment & Plan Note (Signed)
>>  ASSESSMENT AND PLAN FOR UNCONTROLLED DIABETES MELLITUS WITH HYPERGLYCEMIA, WITH LONG-TERM CURRENT USE OF INSULIN  ([facility]) WRITTEN ON 10/03/2021  3:43 PM BY Adraine Biffle, THOMES R, FNP  She has reestablished care with endocrinology does not want to go back to Dr. Lenis Recently started on Trulicity  3 mg once weekly injection. on glipizide  10 mg daily, Toujeo  80 units daily at bedtime.  Stated that she has been taking medications as prescribed, denies hypoglycemia She did not bring her glucometer today , She has been walking almost everyday  checking CBG twice daily AM 140's-150's, PM 202.  Avoid sugar sweets soda continue to exercise daily at least 150 minutes weekly Patient referred to a new endocrinologist today Diabetic foot exam completed-patient would benefit from using a diabetic shoes, patient will discuss this with podiatry.

## 2021-10-03 NOTE — Progress Notes (Signed)
? ?Kathryn [name]     MRN: [id number]      DOB: 08/11/1970 ? ? ?HPI ?Kathryn [name] with past medical history of hypertension, uncontrolled diabetes mellitus, peripheral polyneuropathy, PTSD, mixed hyperlipidemia, obesity is here for follow up for hypertension.  ? ?Hypertension patient states that he has been taking amlodipine 2.5 mg daily as ordered.  Medication was increased to 5 mg by cardiology but she is currently taking the 2.5 mg dose.  She has also been taking Coreg, losartan as prescribed, patient denies chest pain, dizziness, edema, syncope ? ?Uncontrolled diabetes she has been checking CBG twice daily AM 140's-150's, PM 202.  Pt denies polyphagia, polyuria, polydipsia. She has started taking Trulicity '3mg'$  once weekly injection.  ? ?Has an upcoming appointment with neurology on May 15.    ? ?Polyneuropathy has chronic numbness and tingling both foot, pain  in right big toe, diminished sensation in both foot,  states that she goes to a podiatry and would schedule an appointment to be seen soon currently on gabapentin 300 mg daily. ? ? ? ? ?ROS ?Denies recent fever or chills. ?Denies sinus pressure, nasal congestion, ear pain or sore throat. ?Denies chest congestion, productive cough or wheezing. ?Denies chest pains, palpitations and leg swelling ?Denies abdominal pain, nausea, vomiting,diarrhea or constipation.   ?Denies dysuria, frequency, hesitancy or incontinence. ?Denies depression, anxiety or insomnia. ? ? ? ?PE ? ?BP 122/90 (BP Location: Right Arm, Cuff Size: Large)   Pulse 100   Ht 5' (1.524 m)   Wt 157 lb 1.9 oz (71.3 kg)   LMP 01/23/2013   SpO2 93%   BMI 30.69 kg/m?  ? ?Patient alert and oriented and in no cardiopulmonary distress. ? ?Chest: Clear to auscultation bilaterally. ? ?CVS: S1, S2 no murmurs, no S3.Regular rate. ? ?ABD: Soft non tender.  ? ?Ext: No edema, has diminished sensation on bilateral feet on examination, right great toe tender on palpation, no redness, drainage, swelling  noted ? ?MS: Adequate ROM spine, shoulders, hips and knees. ? ?Psych: Good eye contact, normal affect. Memory intact not anxious or depressed appearing. ? ? ?Assessment & Plan ?Essential hypertension, benign ?BP Readings from Last 3 Encounters:  ?10/03/21 122/90  ?09/18/21 (!) 155/90  ?09/10/21 (!) 174/94  ?Started on amlodipine 2.5 mg daily at last visit, she has been taking medication daily also on Coreg 12.5 mg twice daily, losartan 100 mg daily ?Blood pressure well controlled today ?Continue current medications ?DASH diet advised, engage in regular daily exercises at least 150 minutes weekly. ? ?Uncontrolled diabetes mellitus with hyperglycemia, with long-term current use of insulin (Kathryn [name]) ?She has reestablished care with endocrinology does not want to go back to Kathryn [name] ?Recently started on Trulicity 3 mg once weekly injection. on glipizide 10 mg daily, Toujeo 80 units daily at bedtime.  Stated that she has been taking medications as prescribed, denies hypoglycemia ?She did not bring her glucometer today , She has been walking almost everyday  ?checking CBG twice daily AM 140's-150's, PM 202.  ?Avoid sugar sweets soda continue to exercise daily at least 150 minutes weekly ?Patient referred to a new endocrinologist today ?Diabetic foot exam completed-patient would benefit from using a diabetic shoes, patient will discuss this with podiatry. ? ? ?Peripheral polyneuropathy ?Chronic condition on gabapentin 300 mg daily. ?Continue current medication ?Need to examine her foot daily and report any ulcer or wound discussed with patient, patient encouraged to wear comfortable shoes.  She verbalized understanding ? ?Great toe pain, right ?Right great  toe nail appears dark in color, no swelling drainage redness noted. ?Patient encouraged to follow-up with podiatry.  ? ?

## 2021-10-03 NOTE — Assessment & Plan Note (Signed)
Right great toe nail appears dark in color, no swelling drainage redness noted. ?Patient encouraged to follow-up with podiatry. ?

## 2021-10-07 ENCOUNTER — Other Ambulatory Visit (INDEPENDENT_AMBULATORY_CARE_PROVIDER_SITE_OTHER): Payer: Self-pay

## 2021-10-07 DIAGNOSIS — R112 Nausea with vomiting, unspecified: Secondary | ICD-10-CM

## 2021-10-07 DIAGNOSIS — R1010 Upper abdominal pain, unspecified: Secondary | ICD-10-CM

## 2021-10-07 NOTE — Progress Notes (Signed)
? ?Referring:  ?Renee Rival, FNP ?[address] ?[address] ?[place],  [place] [address] ? ?PCP: ?Renee Rival, FNP ? ?Neurology was asked to evaluate Kathryn [name], a 51 year old female for a chief complaint of headaches.  Our recommendations of care will be communicated by shared medical record.   ? ?CC:  headaches ? ?History provided from self ? ?HPI:  ?Medical co-morbidities: HTN, DM2, HLD, asthma ? ?The patient presents for evaluation of headaches which began 6 months ago. They have been gradually worsening in severity. She is currently having headaches 3 days per week. They are described as frontal and occipital stabbing pain with associated photophobia, phonophobia, and nausea. She will occasionally see flashing lights as well. Headaches can last 3 hours at a time.  ? ?She was prescribed Imitrex as needed which makes her nauseated and does not reduce the headache. She is taking Zofran for nausea which is helpful.  ? ?Had severe migraines 10 years ago. Took medication for them at that time but cannot remember what she took. ? ?Headache History: ?Onset: 6 months ?Triggers: none ?Aura: flashing lights ?Location: frontal, occipital ?Quality/Description: stabbing ?Associated Symptoms: ? Photophobia: yes ? Phonophobia: yes ? Nausea: yes ?Worse with activity?: yes ?Duration of headaches: 2-3 hours ? ?Headache days per month: 12 ?Headache free days per month: 18 ? ?Current Treatment: ?Abortive ?Imitrex 50 mg PRN ? ?Preventative ?none ? ?Prior Therapies                                 ?Cymbalta 60 mg BID ?Lisinopril 40 mg daily ?Losartan 100 mg daily ?Carvedilol 12.5 mg BID ?Amitriptyline 25 mg QHS ?Imitrex 50 mg PRN - lack of efficacy, nausea ? ?LABS: ?CBC ?   ?Component Value Date/Time  ? WBC 6.9 07/17/2021 1411  ? RBC 4.43 07/17/2021 1411  ? HGB 12.9 07/17/2021 1411  ? HCT 37.1 07/17/2021 1411  ? PLT 275 07/17/2021 1411  ? MCV 83.7 07/17/2021 1411  ? MCH 29.1 07/17/2021 1411  ? MCHC 34.8  07/17/2021 1411  ? RDW 11.8 07/17/2021 1411  ? LYMPHSABS 3.2 07/17/2021 1411  ? MONOABS 0.4 07/17/2021 1411  ? EOSABS 0.1 07/17/2021 1411  ? BASOSABS 0.0 07/17/2021 1411  ? ? ?  Latest Ref Rng & Units 09/03/2021  ?  8:35 AM 07/30/2021  ? 11:16 AM 07/17/2021  ?  2:11 PM  ?CMP  ?Glucose 70 - 99 mg/dL 96   150   390    ?BUN 6 - 24 mg/dL '17   12   8    ' ?Creatinine 0.57 - 1.00 mg/dL 0.89   0.76   0.77    ?Sodium 134 - 144 mmol/L 142   143   127    ?Potassium 3.5 - 5.2 mmol/L 3.9   3.7   3.1    ?Chloride 96 - 106 mmol/L 101   102   94    ?CO2 20 - 29 mmol/L '27   28   26    ' ?Calcium 8.7 - 10.2 mg/dL 9.9   9.3   8.8    ?Total Protein 6.0 - 8.5 g/dL  6.9   8.1    ?Total Bilirubin 0.0 - 1.2 mg/dL  0.8   1.5    ?Alkaline Phos 44 - 121 IU/L  85   86    ?AST 0 - 40 IU/L  13   11    ?ALT 0 -  32 IU/L  13   13    ? ? ? ?IMAGING:  ?MRI brain 2014: unremarkable ? ?[place] 2017: unremarkable ? ?Imaging independently reviewed on Oct 07, 2021  ? ?Current Outpatient Medications on File Prior to Visit  ?Medication Sig Dispense Refill  ? albuterol (VENTOLIN HFA) 108 (90 Base) MCG/ACT inhaler Inhale 2 puffs into the lungs every 6 (six) hours as needed for wheezing or shortness of breath. 8 g 2  ? amLODipine (NORVASC) 2.5 MG tablet Take 1 tablet (2.5 mg total) by mouth daily. 90 tablet 0  ? aspirin EC 81 MG tablet Take 81 mg by mouth daily.    ? atorvastatin (LIPITOR) 10 MG tablet TAKE 1 TABLET BY MOUTH EVERYDAY AT BEDTIME 90 tablet 0  ? benztropine (COGENTIN) 1 MG tablet TAKE 1 TABLET(1 MG) BY MOUTH AT BEDTIME 90 tablet 2  ? Blood Glucose Monitoring Suppl (ACCU-CHEK GUIDE ME) w/Device KIT 1 Piece by Does not apply route as directed. 1 kit 0  ? budesonide-formoterol (SYMBICORT) 80-4.5 MCG/ACT inhaler Inhale 2 puffs into the lungs 2 (two) times daily. 1 each 3  ? carvedilol (COREG) 12.5 MG tablet Take 1 tablet (12.5 mg total) by mouth 2 (two) times daily. 180 tablet 0  ? Cholecalciferol (VITAMIN D-3) 125 MCG (5000 UT) TABS Take 2 tablets by mouth  daily. 30 tablet 1  ? clonazePAM (KLONOPIN) 0.5 MG tablet Take 1 tablet (0.5 mg total) by mouth 2 (two) times daily as needed for anxiety. 180 tablet 2  ? Dulaglutide (TRULICITY) 3 YH/0.6CB SOPN Inject 3 mg as directed once a week. 2 mL 2  ? DULoxetine (CYMBALTA) 60 MG capsule Take 1 capsule (60 mg total) by mouth 2 (two) times daily. 180 capsule 2  ? gabapentin (NEURONTIN) 300 MG capsule TAKE 1 CAPSULE BY MOUTH EVERY DAY 60 capsule 1  ? glipiZIDE (GLUCOTROL XL) 5 MG 24 hr tablet Take 80m daily 90 tablet 0  ? glucose blood (ACCU-CHEK GUIDE) test strip Use as instructed 150 each 2  ? insulin glargine, 2 Unit Dial, (TOUJEO MAX SOLOSTAR) 300 UNIT/ML Solostar Pen Inject 80 Units into the skin at bedtime. 9 mL 2  ? levothyroxine (SYNTHROID) 75 MCG tablet Take 75 mcg by mouth daily before breakfast.    ? linaclotide (LINZESS) 290 MCG CAPS capsule Take 1 capsule (290 mcg total) by mouth daily before breakfast. 90 capsule 3  ? losartan (COZAAR) 100 MG tablet TAKE 1 TABLET BY MOUTH EVERY DAY 90 tablet 0  ? Multiple Vitamin (MULTIVITAMIN) tablet Take 1 tablet by mouth daily.    ? omeprazole (PRILOSEC) 40 MG capsule TAKE 1 CAPSULE BY MOUTH TWICE A DAY 60 capsule 3  ? ondansetron (ZOFRAN) 4 MG tablet Take 1 tablet (4 mg total) by mouth every 8 (eight) hours as needed for nausea or vomiting. 30 tablet 0  ? Polyvinyl Alcohol-Povidone PF (REFRESH) 1.4-0.6 % SOLN Place 1 drop into both eyes daily as needed (dry eyes).    ? risperiDONE (RISPERDAL) 2 MG tablet TAKE 1 TABLET EVERY MORNING AND 3 TABLETS AT BEDTIME 360 tablet 3  ? sucralfate (CARAFATE) 1 GM/10ML suspension Take 10 mLs (1 g total) by mouth 4 (four) times daily -  with meals and at bedtime. 420 mL 1  ? SUMAtriptan (IMITREX) 50 MG tablet Take 1 tablet (50 mg total) by mouth every 2 (two) hours as needed for migraine. May repeat in 2 hours if headache persists or recurs. 10 tablet 0  ? traZODone (DESYREL) 100 MG tablet  TAKE 2 TABLETS(200 MG) BY MOUTH AT BEDTIME 180 tablet  2  ? ?No current facility-administered medications on file prior to visit.  ? ? ? ?Allergies: ?No Known Allergies ? ?Family History: ?Migraine or other headaches in the family:  none ?Aneurysms in a first degree relative:  mother died of an aneurysm ?Brain tumors in the family:  none ?Other neurological illness in the family:   none ? ?Past Medical History: ?Past Medical History:  ?Diagnosis Date  ? Anemia   ? Anxiety   ? Asthma   ? Depression   ? GERD (gastroesophageal reflux disease)   ? HLD (hyperlipidemia) 04/07/2019  ? Hypertension   ? Hypothyroidism, adult 04/07/2019  ? Neuropathy   ? Obesity (BMI 30.0-34.9) 04/07/2019  ? Schizoaffective disorder ([place])   ? Type II diabetes mellitus, uncontrolled 04/27/2019  ? ? ?Past Surgical History ?Past Surgical History:  ?Procedure Laterality Date  ? BACK SURGERY  09/06/2020  ? BIOPSY  05/03/2021  ? Procedure: BIOPSY;  Surgeon: [name], [name], MD;  [facility]: [facility];  Service: Gastroenterology;;  ? BREAST SURGERY Right   ? biopsy  ? CESAREAN SECTION    ? CHOLECYSTECTOMY  06/02/2012  ? Procedure: LAPAROSCOPIC CHOLECYSTECTOMY;  Surgeon: Jamesetta So, MD;  Location: [facility];  Service: General;  Laterality: N/A;  Attempted laparoscopic cholecystectomy  ? CHOLECYSTECTOMY  06/02/2012  ? Procedure: CHOLECYSTECTOMY;  Surgeon: Jamesetta So, MD;  Location: [facility];  Service: General;  Laterality: N/A;  converted to open at  0905  ? COLONOSCOPY WITH PROPOFOL N/A 07/18/2020  ? prep was fair, one 57m polyp (inflammatory) stool in descending colon, transverse and ascending, distal rectum and anal verge normal  ? ESOPHAGEAL DILATION  12/31/2018  ? Procedure: ESOPHAGEAL DILATION;  Surgeon: RRogene Houston MD;  Location: [facility];  Service: Endoscopy;;  ? ESOPHAGOGASTRODUODENOSCOPY (EGD) WITH PROPOFOL N/A 08/24/2015  ? Procedure: ESOPHAGOGASTRODUODENOSCOPY (EGD) WITH PROPOFOL;  Surgeon: NRogene Houston MD;  Location: [facility];  Service: Endoscopy;  Laterality:  N/A;  1:10 - Ann to notify pt to arrive at 11:30  ? ESOPHAGOGASTRODUODENOSCOPY (EGD) WITH PROPOFOL N/A 12/31/2018  ? normal, no abnormality to explain dysphagia, esophagus dilated.  ? [place]

## 2021-10-07 NOTE — Telephone Encounter (Signed)
Patient aware of all and she is up for the gastric emptying study. I advised I would let Kathryn [name] know and someone from the office would be reaching out to set this up.  ?

## 2021-10-07 NOTE — Telephone Encounter (Signed)
I called and left a message asked that she please return call. 

## 2021-10-08 ENCOUNTER — Telehealth: Payer: Self-pay | Admitting: Psychiatry

## 2021-10-08 ENCOUNTER — Ambulatory Visit (INDEPENDENT_AMBULATORY_CARE_PROVIDER_SITE_OTHER): Payer: BC Managed Care – PPO | Admitting: Psychiatry

## 2021-10-08 ENCOUNTER — Encounter: Payer: Self-pay | Admitting: Psychiatry

## 2021-10-08 VITALS — BP 156/93 | HR 100 | Ht 60.0 in | Wt 162.8 lb

## 2021-10-08 DIAGNOSIS — R200 Anesthesia of skin: Secondary | ICD-10-CM

## 2021-10-08 DIAGNOSIS — R519 Headache, unspecified: Secondary | ICD-10-CM

## 2021-10-08 DIAGNOSIS — Z8249 Family history of ischemic heart disease and other diseases of the circulatory system: Secondary | ICD-10-CM

## 2021-10-08 MED ORDER — TOPIRAMATE 25 MG PO TABS: ORAL_TABLET | ORAL | 3 refills | Status: DC

## 2021-10-08 MED ORDER — RIZATRIPTAN BENZOATE 10 MG PO TABS: 10.0000 mg | ORAL_TABLET | ORAL | 3 refills | Status: DC | PRN

## 2021-10-08 NOTE — Patient Instructions (Addendum)
Start Topamax for headache prevention. Take 25 mg (1 pill) at bedtime for one week, then increase to 50 mg (2 pills) at bedtime for one week, then take 75 mg (3 pills) at bedtime for one week, then take 100 mg (4 pills) at bedtime ? ?Start Maxalt. Take at the onset of migraine. If headache recurs or does not fully resolve, you may take a second dose after 2 hours. Please avoid taking more than 2 days per week to avoid rebound headaches ? ?MRI brain and CTA head ? ? ? ?

## 2021-10-08 NOTE — Telephone Encounter (Signed)
MRI BCBS Auth: [id number] exp. 10/08/21-12/06/21 sent to GI  ?CT BCBS Auth: [id number] exp. 10/08/21-12/06/21 sent to GI ?

## 2021-10-10 ENCOUNTER — Encounter (HOSPITAL_COMMUNITY): Payer: Self-pay

## 2021-10-10 ENCOUNTER — Encounter (HOSPITAL_COMMUNITY)
Admission: RE | Admit: 2021-10-10 | Discharge: 2021-10-10 | Disposition: A | Discharge status: Home / Self Care | Payer: BC Managed Care – PPO | Source: Ambulatory Visit | Attending: Gastroenterology | Admitting: Gastroenterology

## 2021-10-10 DIAGNOSIS — R112 Nausea with vomiting, unspecified: Secondary | ICD-10-CM | POA: Insufficient documentation

## 2021-10-10 DIAGNOSIS — K30 Functional dyspepsia: Secondary | ICD-10-CM | POA: Diagnosis not present

## 2021-10-10 DIAGNOSIS — R6881 Early satiety: Secondary | ICD-10-CM | POA: Diagnosis not present

## 2021-10-10 DIAGNOSIS — R1010 Upper abdominal pain, unspecified: Secondary | ICD-10-CM | POA: Diagnosis not present

## 2021-10-10 MED ORDER — TECHNETIUM TC 99M SULFUR COLLOID
2.0000 | Freq: Once | INTRAVENOUS | Status: AC | PRN
Start: 2021-10-10 — End: 2021-10-10
  Administered 2021-10-10: 2.1 via ORAL

## 2021-10-15 ENCOUNTER — Other Ambulatory Visit (INDEPENDENT_AMBULATORY_CARE_PROVIDER_SITE_OTHER): Payer: Self-pay | Admitting: Gastroenterology

## 2021-10-15 MED ORDER — METOCLOPRAMIDE HCL 5 MG PO TABS: 5.0000 mg | ORAL_TABLET | Freq: Three times a day (TID) | ORAL | 1 refills | Status: DC

## 2021-10-20 ENCOUNTER — Other Ambulatory Visit (INDEPENDENT_AMBULATORY_CARE_PROVIDER_SITE_OTHER): Payer: Self-pay | Admitting: Gastroenterology

## 2021-10-22 ENCOUNTER — Other Ambulatory Visit (INDEPENDENT_AMBULATORY_CARE_PROVIDER_SITE_OTHER): Payer: Self-pay | Admitting: Gastroenterology

## 2021-10-22 ENCOUNTER — Other Ambulatory Visit (INDEPENDENT_AMBULATORY_CARE_PROVIDER_SITE_OTHER): Payer: Self-pay

## 2021-10-22 ENCOUNTER — Telehealth (INDEPENDENT_AMBULATORY_CARE_PROVIDER_SITE_OTHER): Payer: Self-pay

## 2021-10-22 DIAGNOSIS — K5900 Constipation, unspecified: Secondary | ICD-10-CM

## 2021-10-22 MED ORDER — LUBIPROSTONE 24 MCG PO CAPS: 24.0000 ug | ORAL_CAPSULE | Freq: Two times a day (BID) | ORAL | 5 refills | Status: DC

## 2021-10-22 NOTE — Telephone Encounter (Signed)
New Rx submitted for Amitiza 24 mcg bid.  Refused Prescriptions   Name from pharmacy: [facility] [address]       Will file in chart as: LINZESS 290 MCG CAPS capsule   Sig: TAKE 1 CAPSULE BY [place].   Disp:  90 capsule    Refills:  3   Start: 10/22/2021   Class: Normal   Refused by: Karle Barr, CMA   Refusal reason: Change not appropriate (Changed to Amitiza)   Non-formulary   Pharmacy comment: Alternative Requested:LINZESS IS NOT COVERED UNDER HER INSURANCE.      Fill requested [facility]: [id number] [place] [place]) You routed conversation to [facility] [employer] [facility] Chelsea L, NP 3 minutes ago (4:26 PM)   Carlan, Chelsea L, NP  You 13 minutes ago (4:17 PM)   Yes, we can try amitiza 253m BID     You  Carlan, Chelsea L, NP 2 hours ago (2:16 PM)   [facility] the medication is now non formulary and the insurance did not give an alternative. Amitiza ???      Note    Carlan, Chelsea L, NP  You 7 hours ago (9:17 AM)   It is not showing me which constipation meds are covered under her plan, is there any way we can find out if any of the others (amitiza, trulance, motegrity) are covered for her?    You  Carlan, Chelsea L, NP 8 hours ago (7:47 AM)   Not covered by insurance.    Interface, [employer] Out routed conversation to [employer] days ago

## 2021-10-22 NOTE — Telephone Encounter (Signed)
[facility] the medication is now non formulary and the insurance did not give an alternative. Amitiza ???

## 2021-10-23 ENCOUNTER — Ambulatory Visit
Admission: RE | Admit: 2021-10-23 | Discharge: 2021-10-23 | Disposition: A | Discharge status: Home / Self Care | Payer: BC Managed Care – PPO | Source: Ambulatory Visit | Attending: Psychiatry | Admitting: Psychiatry

## 2021-10-23 DIAGNOSIS — R519 Headache, unspecified: Secondary | ICD-10-CM

## 2021-10-23 DIAGNOSIS — J329 Chronic sinusitis, unspecified: Secondary | ICD-10-CM | POA: Diagnosis not present

## 2021-10-23 DIAGNOSIS — R200 Anesthesia of skin: Secondary | ICD-10-CM

## 2021-10-23 DIAGNOSIS — J3489 Other specified disorders of nose and nasal sinuses: Secondary | ICD-10-CM | POA: Diagnosis not present

## 2021-10-23 MED ORDER — GADOBENATE DIMEGLUMINE 529 MG/ML IV SOLN
15.0000 mL | Freq: Once | INTRAVENOUS | Status: AC | PRN
  Administered 2021-10-23: 15 mL via INTRAVENOUS

## 2021-10-24 ENCOUNTER — Ambulatory Visit: Payer: BC Managed Care – PPO | Admitting: "Endocrinology

## 2021-10-25 ENCOUNTER — Ambulatory Visit
Admission: RE | Admit: 2021-10-25 | Discharge: 2021-10-25 | Disposition: A | Discharge status: Home / Self Care | Payer: BC Managed Care – PPO | Source: Ambulatory Visit | Attending: Psychiatry | Admitting: Psychiatry

## 2021-10-25 ENCOUNTER — Other Ambulatory Visit: Payer: Self-pay | Admitting: Nurse Practitioner

## 2021-10-25 DIAGNOSIS — I10 Essential (primary) hypertension: Secondary | ICD-10-CM

## 2021-10-25 DIAGNOSIS — E039 Hypothyroidism, unspecified: Secondary | ICD-10-CM

## 2021-10-25 DIAGNOSIS — I671 Cerebral aneurysm, nonruptured: Secondary | ICD-10-CM | POA: Diagnosis not present

## 2021-10-25 DIAGNOSIS — E782 Mixed hyperlipidemia: Secondary | ICD-10-CM

## 2021-10-25 DIAGNOSIS — R200 Anesthesia of skin: Secondary | ICD-10-CM

## 2021-10-25 DIAGNOSIS — Z8249 Family history of ischemic heart disease and other diseases of the circulatory system: Secondary | ICD-10-CM

## 2021-10-25 DIAGNOSIS — E1165 Type 2 diabetes mellitus with hyperglycemia: Secondary | ICD-10-CM

## 2021-10-25 DIAGNOSIS — G43909 Migraine, unspecified, not intractable, without status migrainosus: Secondary | ICD-10-CM | POA: Diagnosis not present

## 2021-10-25 MED ORDER — IOPAMIDOL (ISOVUE-370) INJECTION 76%
75.0000 mL | Freq: Once | INTRAVENOUS | Status: AC | PRN
  Administered 2021-10-25: 75 mL via INTRAVENOUS

## 2021-10-31 ENCOUNTER — Ambulatory Visit (INDEPENDENT_AMBULATORY_CARE_PROVIDER_SITE_OTHER): Payer: BC Managed Care – PPO | Admitting: Nurse Practitioner

## 2021-10-31 ENCOUNTER — Encounter: Payer: Self-pay | Admitting: Nurse Practitioner

## 2021-10-31 VITALS — BP 122/80 | HR 87 | Ht 60.0 in | Wt 160.0 lb

## 2021-10-31 DIAGNOSIS — G43009 Migraine without aura, not intractable, without status migrainosus: Secondary | ICD-10-CM

## 2021-10-31 DIAGNOSIS — F339 Major depressive disorder, recurrent, unspecified: Secondary | ICD-10-CM | POA: Insufficient documentation

## 2021-10-31 DIAGNOSIS — I10 Essential (primary) hypertension: Secondary | ICD-10-CM | POA: Diagnosis not present

## 2021-10-31 DIAGNOSIS — Z794 Long term (current) use of insulin: Secondary | ICD-10-CM

## 2021-10-31 DIAGNOSIS — E1165 Type 2 diabetes mellitus with hyperglycemia: Secondary | ICD-10-CM

## 2021-10-31 DIAGNOSIS — E1143 Type 2 diabetes mellitus with diabetic autonomic (poly)neuropathy: Secondary | ICD-10-CM | POA: Diagnosis not present

## 2021-10-31 DIAGNOSIS — Z23 Encounter for immunization: Secondary | ICD-10-CM | POA: Diagnosis not present

## 2021-10-31 DIAGNOSIS — G43909 Migraine, unspecified, not intractable, without status migrainosus: Secondary | ICD-10-CM | POA: Insufficient documentation

## 2021-10-31 MED ORDER — DEXCOM G7 RECEIVER DEVI: 0 refills | Status: DC

## 2021-10-31 MED ORDER — DEXCOM G7 SENSOR MISC
3 refills | Status: DC
Start: 2021-10-31 — End: 2022-06-23

## 2021-10-31 MED ORDER — GABAPENTIN 100 MG PO CAPS: 100.0000 mg | ORAL_CAPSULE | Freq: Three times a day (TID) | ORAL | 3 refills | Status: DC

## 2021-10-31 NOTE — Progress Notes (Addendum)
Kathryn [name]     MRN: [id number]      DOB: [birth year]   HPI Kathryn [name] with past medical history of essential hypertension, moderate persistent asthma, type 2 diabetes uncontrolled, polyneuropathy, mixed hyperlipidemia is here for follow up for hypertension.  Amlodipine was decreased from 5 mg to 2.5 mg daily at her previous visit.  Patient reports that she has been taking all medications as prescribed denies dizziness, chest pain, edema  Migraines has had MRI done, has upcoming CT angio of her head , has upcoming appointment with neurology , reports that she is still having migraine HA and she has been taking her migraine medications as ordered currently on Topamax and sumatriptan   Depression.  Patient stated that she lost her 44-year-old grandson child in May states that she has been depressed due to these.  She is currently seen psychiatrist for PTSD and anxiety, denies SI, HI.  States that she will schedule an appointment with her psychiatrist for counseling    ROS Denies recent fever or chills. Denies sinus pressure, nasal congestion, ear pain or sore throat. Denies chest congestion, productive cough or wheezing. Denies chest pains, palpitations and leg swelling Denies abdominal pain, nausea, vomiting,diarrhea or constipation.   Denies dysuria, frequency, hesitancy or incontinence. Denies joint pain, swelling and limitation in mobility.    PE  BP 122/80 (BP Location: Left Arm, Cuff Size: Large)   Pulse 87   Ht 5' (1.524 m)   Wt 160 lb (72.6 kg)   LMP 01/23/2013   SpO2 97%   BMI 31.25 kg/m   Patient alert and oriented and in no cardiopulmonary distress.  Chest: Clear to auscultation bilaterally.  CVS: S1, S2 no murmurs, no S3.Regular rate.  ABD: Soft non tender.   Ext: No edema  MS: Adequate ROM spine, shoulders, hips and knees.  Psych: Good eye contact, normal affect. Memory intact not anxious or depressed appearing.  CNS: CN 2-12 intact, power,  normal  throughout.no focal deficits noted.   Assessment & Plan  Essential hypertension, benign BP Readings from Last 3 Encounters:  10/31/21 122/80  10/08/21 (!) 156/93  10/03/21 122/90   Cardiology increased amlodipine to 5 mg at her last visit with them ,  , amlodipine decreased to 2.5 mg during her last visit with me due to blood pressure well controlled in the office, pt still doing well  on amlodipine 2.5 mg daily, Coreg 12.5 mg twice daily, losartan 100 mg daily Blood pressure well controlled DASH diet advised engage in regular daily exercises at least 150 minutes weekly Follow-up in 4 weeks  Uncontrolled diabetes mellitus with hyperglycemia, with long-term current use of insulin ([name]) Patient encouraged to maintain close follow-up with endocrinology States that she will go to Dr. Rebecca [name] office to schedule her appointments because she missed her previous appointment.  Need to get A1c ordered controlled to avoid risk of CVA, heart attack discussed with patient she verbalized understanding Currently on Trulicity 3 mg once weekly injection, glipizide 10 mg daily, Toujeo 80 mg daily.  Patient denies hypoglycemia reports AM CBG of 193, states that she checks her blood sugar 4 times daily I discussed the need for Dexcom CGM, with the patient patient agreed to get a Dexcom CGM today Has had a eye exam done with her eye doctor will obtain records today. Has upcoming appointment with podiatry  Diabetic autonomic neuropathy associated with type 2 diabetes mellitus ([name]) Currently on gabapentin 300 mg daily at bedtime Patient states that she  would prefer to take 100 mg 3 times daily stating that that helped her symptoms better Rx gabapentin 100 mg 3 times daily  Migraine Currently established with neurology Taking Topamax 75 mg daily, sumatriptan 10 mg as needed Has done MRI has upcoming CT of the head Patient encouraged to maintain close follow-up with neurology she verbalized  understanding  Depression, recurrent ([name]) PHQ-9 score 21 Lost her grand daughter recently Currently seeing psychiatrist On clonazepam 0.5 mg 3 times daily as needed, trazodone 100 mg at bedtime risperidone 2 mg in the morning, 6 mg at night. Patient states that she will schedule a follow-up appointment with her psychiatrist for grief counseling Denies SI, HI  Need for varicella vaccine Patient educated on CDC recommendation for the vaccine. Verbal consent was obtained from the patient, vaccine administered by nurse, no sign of adverse reactions noted at this time. Patient education on arm soreness and use of tylenol  for this patient  was discussed. Patient educated on the signs and symptoms of adverse effect and advise to contact the office if they occur

## 2021-10-31 NOTE — Patient Instructions (Signed)
Nurse please get  eye exam records from [facility] in [place]. Get your second dose of shingles vaccine today. Please order the dexcom CGM today.   It is important that you exercise regularly at least 30 minutes 5 times a week.  Think about what you will eat, plan ahead. Choose " clean, green, fresh or frozen" over canned, processed or packaged foods which are more sugary, salty and fatty. 70 to 75% of food eaten should be vegetables and fruit. Three meals at set times with snacks allowed between meals, but they must be fruit or vegetables. Aim to eat over a 12 hour period , example 7 am to 7 pm, and STOP after  your last meal of the day. Drink water,generally about 64 ounces per day, no other drink is as healthy. Fruit juice is best enjoyed in a healthy way, by EATING the fruit.  Thanks for choosing [facility], we consider it a privelige to serve you.

## 2021-10-31 NOTE — Assessment & Plan Note (Signed)
Patient encouraged to maintain close follow-up with endocrinology States that she will go to Dr. Rebecca Eaton office to schedule her appointments because she missed her previous appointment.  Need to get A1c ordered controlled to avoid risk of CVA, heart attack discussed with patient she verbalized understanding Currently on Trulicity 3 mg once weekly injection, glipizide 10 mg daily, Toujeo 80 mg daily.  Patient denies hypoglycemia reports AM CBG of 193, states that she checks her blood sugar 4 times daily I discussed the need for Dexcom CGM, with the patient patient agreed to get a Dexcom CGM today Has had a eye exam done with her eye doctor will obtain records today. Has upcoming appointment with podiatry

## 2021-10-31 NOTE — Assessment & Plan Note (Signed)
BP Readings from Last 3 Encounters:  10/31/21 122/80  10/08/21 (!) 156/93  10/03/21 122/90   Cardiology increased amlodipine to 5 mg at her last visit with them ,  , amlodipine decreased to 2.5 mg during her last visit with me due to blood pressure well controlled in the office, pt still doing well  on amlodipine 2.5 mg daily, Coreg 12.5 mg twice daily, losartan 100 mg daily Blood pressure well controlled DASH diet advised engage in regular daily exercises at least 150 minutes weekly Follow-up in 4 weeks

## 2021-10-31 NOTE — Assessment & Plan Note (Signed)
Currently established with neurology Taking Topamax 75 mg daily, sumatriptan 10 mg as needed Has done MRI has upcoming CT of the head Patient encouraged to maintain close follow-up with neurology she verbalized understanding

## 2021-10-31 NOTE — Assessment & Plan Note (Signed)
PHQ-9 score 21 Lost her grand daughter recently Currently seeing psychiatrist On clonazepam 0.5 mg 3 times daily as needed, trazodone 100 mg at bedtime risperidone 2 mg in the morning, 6 mg at night. Patient states that she will schedule a follow-up appointment with her psychiatrist for grief counseling Denies SI, HI

## 2021-10-31 NOTE — Assessment & Plan Note (Signed)
Patient educated on CDC recommendation for the vaccine. Verbal consent was obtained from the patient, vaccine administered by nurse, no sign of adverse reactions noted at this time. Patient education on arm soreness and use of tylenol  for this patient  was discussed. Patient educated on the signs and symptoms of adverse effect and advise to contact the office if they occur.  ?

## 2021-10-31 NOTE — Assessment & Plan Note (Signed)
>>  ASSESSMENT AND PLAN FOR UNCONTROLLED DIABETES MELLITUS WITH HYPERGLYCEMIA, WITH LONG-TERM CURRENT USE OF INSULIN  ([facility]) WRITTEN ON 10/31/2021  8:40 AM BY Adlean Hardeman, THOMES SAUNDERS, FNP  Patient encouraged to maintain close follow-up with endocrinology States that she will go to Dr. Merrell office to schedule her appointments because she missed her previous appointment.  Need to get A1c ordered controlled to avoid risk of CVA, heart attack discussed with patient she verbalized understanding Currently on Trulicity  3 mg once weekly injection, glipizide  10 mg daily, Toujeo  80 mg daily.  Patient denies hypoglycemia reports AM CBG of 193, states that she checks her blood sugar 4 times daily I discussed the need for Dexcom CGM, with the patient patient agreed to get a Dexcom CGM today Has had a eye exam done with her eye doctor will obtain records today. Has upcoming appointment with podiatry

## 2021-10-31 NOTE — Assessment & Plan Note (Signed)
Currently on gabapentin 300 mg daily at bedtime Patient states that she would prefer to take 100 mg 3 times daily stating that that helped her symptoms better Rx gabapentin 100 mg 3 times daily

## 2021-11-05 ENCOUNTER — Telehealth (INDEPENDENT_AMBULATORY_CARE_PROVIDER_SITE_OTHER): Payer: BC Managed Care – PPO | Admitting: Psychiatry

## 2021-11-05 ENCOUNTER — Encounter (HOSPITAL_COMMUNITY): Payer: Self-pay | Admitting: Psychiatry

## 2021-11-05 DIAGNOSIS — J4540 Moderate persistent asthma, uncomplicated: Secondary | ICD-10-CM

## 2021-11-05 DIAGNOSIS — F4310 Post-traumatic stress disorder, unspecified: Secondary | ICD-10-CM | POA: Diagnosis not present

## 2021-11-05 DIAGNOSIS — F251 Schizoaffective disorder, depressive type: Secondary | ICD-10-CM

## 2021-11-05 MED ORDER — DULOXETINE HCL 60 MG PO CPEP: 60.0000 mg | ORAL_CAPSULE | Freq: Two times a day (BID) | ORAL | 2 refills | Status: DC

## 2021-11-05 MED ORDER — BENZTROPINE MESYLATE 1 MG PO TABS: ORAL_TABLET | ORAL | 2 refills | Status: DC

## 2021-11-05 MED ORDER — CLONAZEPAM 0.5 MG PO TABS
0.5000 mg | ORAL_TABLET | Freq: Three times a day (TID) | ORAL | 2 refills | Status: DC | PRN
Start: 2021-11-05 — End: 2022-01-06

## 2021-11-05 MED ORDER — TRAZODONE HCL 100 MG PO TABS: ORAL_TABLET | ORAL | 2 refills | Status: DC

## 2021-11-05 MED ORDER — RISPERIDONE 2 MG PO TABS: ORAL_TABLET | ORAL | 3 refills | Status: DC

## 2021-11-05 NOTE — Progress Notes (Signed)
BH MD/PA/NP OP Progress Note  11/05/2021 3:04 PM Kathryn [name]  MRN:  [id number]  Chief Complaint:  Chief Complaint  Patient presents with   Anxiety   Depression   Hallucinations   Follow-up   HPI: This patient is a 51 year old  black female who lives with her husband in [place].  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  She tells me that on May 28 her 31-year-old granddaughter died suddenly.  This granddaughter and her mother had been living with the patient until a month prior to her death.  The daughter had taken the granddaughter to live with the boyfriend in [place].  He was the only one home when the child died.  According to the patient the child had no prior illnesses and was not on any medications.  She states that the autopsy report indicated that the child died of natural causes.  She is finding it very hard to accept this.  The police did not investigate further and she is very suspicious of the daughter's boyfriend.  They are no longer together.  Since this happened the patient is crying a lot.  She is having some trouble with sleep and anxiety.  She has been hearing more voices.  She denies thoughts of self-harm or suicidal ideation.  I urged her to get into therapy here and she now agrees.  We can also increase the clonazepam to help her through this.  She is already on a high dose of Risperdal. Visit Diagnosis:    ICD-10-CM   1. Schizoaffective disorder, depressive type ([place])  F25.1     2. Moderate persistent asthma, unspecified whether complicated  G25.42 DULoxetine (CYMBALTA) 60 MG capsule    3. PTSD (post-traumatic stress disorder)  F43.10      Past Psychiatric History: 2 previous psychiatric hospitalizations for schizoaffective disorder  Past Medical History:  Past Medical History:  Diagnosis Date   Anemia    Anxiety    Asthma    Depression    GERD  (gastroesophageal reflux disease)    HLD (hyperlipidemia) 04/07/2019   Hypertension    Hypothyroidism, adult 04/07/2019   Neuropathy    Obesity (BMI 30.0-34.9) 04/07/2019   Schizoaffective disorder ([place])    Type II diabetes mellitus, uncontrolled 04/27/2019    Past Surgical History:  Procedure Laterality Date   BACK SURGERY  09/06/2020   BIOPSY  05/03/2021   Procedure: BIOPSY;  Surgeon: [name], MD;  [facility];  Service: Gastroenterology;;   BREAST SURGERY Right    biopsy   CESAREAN SECTION     CHOLECYSTECTOMY  06/02/2012   Procedure: LAPAROSCOPIC CHOLECYSTECTOMY;  Surgeon: Jamesetta So, MD;  Location: [facility];  Service: General;  Laterality: N/A;  Attempted laparoscopic cholecystectomy   CHOLECYSTECTOMY  06/02/2012   Procedure: CHOLECYSTECTOMY;  Surgeon: Jamesetta So, MD;  Location: [facility];  Service: General;  Laterality: N/A;  converted to open at  7062   COLONOSCOPY WITH PROPOFOL N/A 07/18/2020   prep was fair, one 62m polyp (inflammatory) stool in descending colon, transverse and ascending, distal rectum and anal verge normal   ESOPHAGEAL DILATION  12/31/2018   Procedure: ESOPHAGEAL DILATION;  Surgeon: RRogene Houston MD;  [facility];  Service: Endoscopy;;   ESOPHAGOGASTRODUODENOSCOPY (EGD) WITH PROPOFOL N/A 08/24/2015   Procedure: ESOPHAGOGASTRODUODENOSCOPY (EGD) WITH PROPOFOL;  Surgeon: NRogene Houston MD;  [facility];  Service: Endoscopy;  Laterality: N/A;  1:10 - Ann to notify pt to arrive at 11:30   ESOPHAGOGASTRODUODENOSCOPY (EGD) WITH PROPOFOL N/A 12/31/2018   normal, no abnormality to explain dysphagia, esophagus dilated.   ESOPHAGOGASTRODUODENOSCOPY (EGD) WITH PROPOFOL N/A 05/03/2021   Procedure: ESOPHAGOGASTRODUODENOSCOPY (EGD) WITH PROPOFOL;  Surgeon: [name], MD;  [facility];  Service: Gastroenterology;  Laterality: N/A;  1:20   FLEXIBLE SIGMOIDOSCOPY  07/17/2020   Procedure:  FLEXIBLE SIGMOIDOSCOPY;  Surgeon: [name], [name], MD;  [facility];  Service: Gastroenterology;;   POLYPECTOMY  07/18/2020   Procedure: POLYPECTOMY INTESTINAL;  Surgeon: [name], [name], MD;  [facility];  Service: Gastroenterology;;  ascending colon polyp;    SAVORY DILATION  05/03/2021   Procedure: SAVORY DILATION;  Surgeon: [name], MD;  [facility];  Service: Gastroenterology;;   tooth removal  2019   all teeth removed   TUBAL LIGATION     X3    Family Psychiatric History: See below  Family History:  Family History  Problem Relation Age of Onset   Hypertension Mother    Alcohol abuse Mother    Heart disease Mother    Kidney disease Mother    Aneurysm Mother        brain   Hypertension Father    Alcohol abuse Sister    Alcohol abuse Maternal Grandmother    Alcohol abuse Maternal Grandfather    Hearing loss Daughter    Alcohol abuse Maternal Aunt    Alcohol abuse Paternal Aunt    Alcohol abuse Cousin    Stroke Other    Diabetes Other    Cancer Other    Seizures Other     Social History:  Social History   Socioeconomic History   Marital status: Married    Spouse name: Pieter Partridge   Number of children: 3   Years of education: 12   Highest education level: Not on file  Occupational History    Comment: [employer]    Comment: 3rd shift  Tobacco Use   Smoking status: Never   Smokeless tobacco: Never  Vaping Use   Vaping Use: Never used  Substance and Sexual Activity   Alcohol use: No    Alcohol/week: 0.0 standard drinks of alcohol   Drug use: No   Sexual activity: Not Currently    Birth control/protection: Surgical  Other Topics Concern   Not on file  Social History Narrative   Married for 22 years.On disability secondary to schizophrenia.   Lives with husband.   Caffeine- decaf coffee, tea   Social Determinants of Health   Financial Resource Strain: Not on file  Food Insecurity: Not on file   Transportation Needs: Not on file  Physical Activity: Not on file  Stress: Not on file  Social Connections: Not on file    Allergies: No Known Allergies  Metabolic Disorder Labs: Lab Results  Component Value Date   HGBA1C 12.5 (H) 07/30/2021   MPG 332 07/30/2020   MPG  07/19/2019     Comment:     eAG cannot be calculated. Hemoglobin A1c result exceeds the linearity of the assay.    No results found for: "PROLACTIN" Lab Results  Component Value Date   CHOL 141 07/30/2021   TRIG 76 07/30/2021   HDL 71 07/30/2021   CHOLHDL 2.0 07/30/2021   LDLCALC 55 07/30/2021   LDLCALC 37 07/30/2020   Lab Results  Component Value Date  TSH 1.360 09/03/2021   TSH 0.289 (L) 07/30/2021    Therapeutic Level Labs: Lab Results  Component Value Date   LITHIUM <0.06 (L) 07/03/2016   LITHIUM 1.17 09/09/2015   No results found for: "VALPROATE" No results found for: "CBMZ"  Current Medications: Current Outpatient Medications  Medication Sig Dispense Refill   albuterol (VENTOLIN HFA) 108 (90 Base) MCG/ACT inhaler Inhale 2 puffs into the lungs every 6 (six) hours as needed for wheezing or shortness of breath. 8 g 2   amLODipine (NORVASC) 2.5 MG tablet Take 1 tablet (2.5 mg total) by mouth daily. 90 tablet 0   aspirin EC 81 MG tablet Take 81 mg by mouth daily.     atorvastatin (LIPITOR) 10 MG tablet TAKE 1 TABLET BY MOUTH EVERYDAY AT BEDTIME 90 tablet 0   benztropine (COGENTIN) 1 MG tablet TAKE 1 TABLET(1 MG) BY MOUTH AT BEDTIME 90 tablet 2   Blood Glucose Monitoring Suppl (ACCU-CHEK GUIDE ME) w/Device KIT 1 Piece by Does not apply route as directed. 1 kit 0   budesonide-formoterol (SYMBICORT) 80-4.5 MCG/ACT inhaler Inhale 2 puffs into the lungs 2 (two) times daily. 1 each 3   carvedilol (COREG) 12.5 MG tablet Take 1 tablet (12.5 mg total) by mouth 2 (two) times daily. 180 tablet 0   Cholecalciferol (VITAMIN D-3) 125 MCG (5000 UT) TABS Take 2 tablets by mouth daily. 30 tablet 1    clonazePAM (KLONOPIN) 0.5 MG tablet Take 1 tablet (0.5 mg total) by mouth 3 (three) times daily as needed for anxiety. 270 tablet 2   Continuous Blood Gluc Receiver (DEXCOM G7 RECEIVER) DEVI Used to monitor blood glucose DX: E11.65 1 each 0   Continuous Blood Gluc Sensor (DEXCOM G7 SENSOR) MISC Used to monitor blood glucose DX E11.65 1 each 3   Dulaglutide (TRULICITY) 3 DH/6.8SH SOPN Inject 3 mg as directed once a week. 2 mL 2   DULoxetine (CYMBALTA) 60 MG capsule Take 1 capsule (60 mg total) by mouth 2 (two) times daily. 180 capsule 2   gabapentin (NEURONTIN) 100 MG capsule Take 1 capsule (100 mg total) by mouth 3 (three) times daily. 90 capsule 3   glipiZIDE (GLUCOTROL XL) 5 MG 24 hr tablet TAKE 2 TABLETS (10MG) BY MOUTH DAILY 90 tablet 0   glucose blood (ACCU-CHEK GUIDE) test strip Use as instructed 150 each 2   insulin glargine, 2 Unit Dial, (TOUJEO MAX SOLOSTAR) 300 UNIT/ML Solostar Pen Inject 80 Units into the skin at bedtime. 9 mL 2   levothyroxine (SYNTHROID) 75 MCG tablet Take 75 mcg by mouth daily before breakfast.     losartan (COZAAR) 100 MG tablet TAKE 1 TABLET BY MOUTH EVERY DAY 90 tablet 0   lubiprostone (AMITIZA) 24 MCG capsule Take 1 capsule (24 mcg total) by mouth 2 (two) times daily with a meal. 60 capsule 5   metoCLOPramide (REGLAN) 5 MG tablet Take 1 tablet (5 mg total) by mouth 4 (four) times daily -  before meals and at bedtime. 120 tablet 1   Multiple Vitamin (MULTIVITAMIN) tablet Take 1 tablet by mouth daily.     omeprazole (PRILOSEC) 40 MG capsule TAKE 1 CAPSULE BY MOUTH TWICE A DAY 60 capsule 3   ondansetron (ZOFRAN) 4 MG tablet Take 1 tablet (4 mg total) by mouth every 8 (eight) hours as needed for nausea or vomiting. 30 tablet 0   pantoprazole (PROTONIX) 40 MG tablet Take 40 mg by mouth daily.     Polyvinyl Alcohol-Povidone PF (REFRESH) 1.4-0.6 % SOLN  Place 1 drop into both eyes daily as needed (dry eyes).     risperiDONE (RISPERDAL) 2 MG tablet TAKE 1 TABLET EVERY  MORNING AND 3 TABLETS AT BEDTIME 360 tablet 3   rizatriptan (MAXALT) 10 MG tablet Take 1 tablet (10 mg total) by mouth as needed for migraine. May repeat in 2 hours if needed 10 tablet 3   sucralfate (CARAFATE) 1 GM/10ML suspension Take 10 mLs (1 g total) by mouth 4 (four) times daily -  with meals and at bedtime. 420 mL 1   topiramate (TOPAMAX) 25 MG tablet Take 25 mg (1 pill) at bedtime for one week, then increase to 50 mg (2 pills) at bedtime for one week, then take 75 mg (3 pills) at bedtime for one week, then take 100 mg (4 pills) at bedtime 120 tablet 3   traZODone (DESYREL) 100 MG tablet TAKE 2 TABLETS(200 MG) BY MOUTH AT BEDTIME 180 tablet 2   No current facility-administered medications for this visit.     Musculoskeletal: Strength & Muscle Tone: na Gait & Station: na Patient leans: N/A  Psychiatric Specialty Exam: Review of Systems  Psychiatric/Behavioral:  Positive for decreased concentration, dysphoric mood and sleep disturbance. The patient is nervous/anxious.   All other systems reviewed and are negative.   Last menstrual period 01/23/2013.There is no height or weight on file to calculate BMI.  General Appearance: NA  Eye Contact:  NA  Speech:  Clear and Coherent  Volume:  Normal  Mood:  Anxious and Depressed  Affect:  NA  Thought Process:  Goal Directed  Orientation:  Full (Time, Place, and Person)  Thought Content: Hallucinations: Auditory   Suicidal Thoughts:  No  Homicidal Thoughts:  No  Memory:  Immediate;   Good Recent;   Good Remote;   Fair  Judgement:  Good  Insight:  Fair  Psychomotor Activity:  Decreased  Concentration:  Concentration: Poor and Attention Span: Poor  Recall:  Good  Fund of Knowledge: Fair  Language: Good  Akathisia:  No  Handed:  Right  AIMS (if indicated): not done  Assets:  Communication Skills Desire for Improvement Resilience Social Support  ADL's:  Intact  Cognition: WNL  Sleep:  Fair   Screenings: PHQ2-9     Flowsheet Row Video Visit from 11/05/2021 in [facility] Visit from 10/31/2021 in [facility] Office Visit from 10/03/2021 in [facility] Office Visit from 08/29/2021 in [facility] Video Visit from 08/16/2021 in [facility]  PHQ-2 Total Score 4 6 0 0 0  PHQ-9 Total Score 12 21 -- -- --      Flowsheet Row Video Visit from 08/16/2021 in [facility] from 07/17/2021 in [facility] Video Visit from 06/19/2021 in [facility] No Risk No Risk No Risk        Assessment and Plan: This patient is a 51 year old female with a history of schizoaffective disorder and PTSD.  Since the death of her granddaughter recently she has been more depressed and anxious.  We will increase clonazepam to 0.5 mg 3 times daily for anxiety.  She will continue Risperdal 2 mg in the morning and 6 mg in the evening for schizophrenia symptoms, Cogentin 1 mg to prevent side effects from Risperdal, Cymbalta 60 mg twice daily for depression and trazodone 200 mg at bedtime for sleep.  She will return to see me in 4  weeks or call sooner as needed  Collaboration of Care: Collaboration of Care: Referral or follow-up with counselor/therapist [facility] will be referred for counseling in our office.  Patient/Guardian was advised Release of Information must be obtained prior to any record release in order to collaborate their care with an outside provider. Patient/Guardian was advised if they have not already done so to contact the registration department to sign all necessary forms in order for [place] to release information regarding their care.   Consent: Patient/Guardian gives verbal consent for treatment and assignment of benefits for services provided during this visit. Patient/Guardian expressed  understanding and agreed to proceed.    Levonne Spiller, MD 11/05/2021, 3:04 PM

## 2021-11-06 ENCOUNTER — Other Ambulatory Visit: Payer: Self-pay | Admitting: Nurse Practitioner

## 2021-11-06 ENCOUNTER — Telehealth: Payer: Self-pay | Admitting: Psychiatry

## 2021-11-06 DIAGNOSIS — J4540 Moderate persistent asthma, uncomplicated: Secondary | ICD-10-CM

## 2021-11-06 NOTE — Telephone Encounter (Signed)
Brain looks normal on the MRI. There is some evidence of sinusitis, so I'd recommend reaching out to her family doctor if she experience any sinus symptoms like nasal congestion or runny nose.  CTA is normal

## 2021-11-06 NOTE — Telephone Encounter (Signed)
Called patient and informed her of results per Dr Billey Gosling. Patient verbalized understanding, appreciation.

## 2021-11-06 NOTE — Telephone Encounter (Signed)
Pt called wanting to know when she will be called with her MRI and CT Scan results. Please advise.

## 2021-11-12 NOTE — Progress Notes (Signed)
Virtual Visit via Telephone Note  I connected with Kathryn [name] on 11/12/21 at  2:40 PM EDT by telephone and verified that I am speaking with the correct person using two identifiers.  Location: Patient: home Provider: office   I discussed the limitations, risks, security and privacy concerns of performing an evaluation and management service by telephone and the availability of in person appointments. I also discussed with the patient that there may be a patient responsible charge related to this service. The patient expressed understanding and agreed to proceed.      I discussed the assessment and treatment plan with the patient. The patient was provided an opportunity to ask questions and all were answered. The patient agreed with the plan and demonstrated an understanding of the instructions.   The patient was advised to call back or seek an in-person evaluation if the symptoms worsen or if the condition fails to improve as anticipated.  I provided 12 minutes of non-face-to-face time during this encounter.   Levonne Spiller, MD  [facility] MD/PA/NP OP Progress Note  11/12/2021 1:02 PM Kathryn [name]  MRN:  [id number]  Chief Complaint:  Chief Complaint  Patient presents with   Anxiety   Depression   Hallucinations   Follow-up   HPI: This patient is a 51 year old  black female who lives with her husband in [place].  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  She tells me that on May 26 her 76-year-old granddaughter died suddenly.  This granddaughter and her mother had been living with the patient until a month prior to her death.  The daughter had taken the granddaughter to live with the boyfriend in [place].  He was the only one home when the child died.  According to the patient the child had no prior illnesses and was not on any medications.  She states that the autopsy report  indicated that the child died of natural causes.  She is finding it very hard to accept this.  The police did not investigate further and she is very suspicious of the daughter's boyfriend.  They are no longer together.  Since this happened the patient is crying a lot.  She is having some trouble with sleep and anxiety.  She has been hearing more voices.  She denies thoughts of self-harm or suicidal ideation.  I urged her to get into therapy here and she now agrees.  We can also increase the clonazepam to help her through this.  She is already on a high dose of Risperdal. Visit Diagnosis:    ICD-10-CM   1. Schizoaffective disorder, depressive type ([place])  F25.1     2. Moderate persistent asthma, unspecified whether complicated  W09.81 DULoxetine (CYMBALTA) 60 MG capsule    3. PTSD (post-traumatic stress disorder)  F43.10      Past Psychiatric History: 2 previous psychiatric hospitalizations for schizoaffective disorder  Past Medical History:  Past Medical History:  Diagnosis Date   Anemia    Anxiety    Asthma    Depression    GERD (gastroesophageal reflux disease)    HLD (hyperlipidemia) 04/07/2019   Hypertension    Hypothyroidism, adult 04/07/2019   Neuropathy    Obesity (BMI 30.0-34.9) 04/07/2019   Schizoaffective disorder ([place])    Type II diabetes mellitus, uncontrolled 04/27/2019    Past Surgical History:  Procedure Laterality Date   BACK SURGERY  09/06/2020   BIOPSY  05/03/2021   Procedure: BIOPSY;  Surgeon: [name], [name], MD;  [facility];  Service: Gastroenterology;;   BREAST SURGERY Right    biopsy   CESAREAN SECTION     CHOLECYSTECTOMY  06/02/2012   Procedure: LAPAROSCOPIC CHOLECYSTECTOMY;  Surgeon: Jamesetta So, MD;  Location: [facility];  Service: General;  Laterality: N/A;  Attempted laparoscopic cholecystectomy   CHOLECYSTECTOMY  06/02/2012   Procedure: CHOLECYSTECTOMY;  Surgeon: Jamesetta So, MD;  Location: [facility];  Service: General;   Laterality: N/A;  converted to open at  2947   COLONOSCOPY WITH PROPOFOL N/A 07/18/2020   prep was fair, one 23m polyp (inflammatory) stool in descending colon, transverse and ascending, distal rectum and anal verge normal   ESOPHAGEAL DILATION  12/31/2018   Procedure: ESOPHAGEAL DILATION;  Surgeon: RRogene Houston MD;  [facility];  Service: Endoscopy;;   ESOPHAGOGASTRODUODENOSCOPY (EGD) WITH PROPOFOL N/A 08/24/2015   Procedure: ESOPHAGOGASTRODUODENOSCOPY (EGD) WITH PROPOFOL;  Surgeon: NRogene Houston MD;  [facility];  Service: Endoscopy;  Laterality: N/A;  1:10 - Ann to notify pt to arrive at 11:30   ESOPHAGOGASTRODUODENOSCOPY (EGD) WITH PROPOFOL N/A 12/31/2018   normal, no abnormality to explain dysphagia, esophagus dilated.   ESOPHAGOGASTRODUODENOSCOPY (EGD) WITH PROPOFOL N/A 05/03/2021   Procedure: ESOPHAGOGASTRODUODENOSCOPY (EGD) WITH PROPOFOL;  Surgeon: CHarvel Quale MD;  [facility];  Service: Gastroenterology;  Laterality: N/A;  1:20   FLEXIBLE SIGMOIDOSCOPY  07/17/2020   Procedure: FLEXIBLE SIGMOIDOSCOPY;  Surgeon: CMontez Morita DQuillian Quince MD;  [facility];  Service: Gastroenterology;;   POLYPECTOMY  07/18/2020   Procedure: POLYPECTOMY INTESTINAL;  Surgeon: CMontez Morita DQuillian Quince MD;  [facility];  Service: Gastroenterology;;  ascending colon polyp;    SAVORY DILATION  05/03/2021   Procedure: SAVORY DILATION;  Surgeon: CHarvel Quale MD;  [facility];  Service: Gastroenterology;;   tooth removal  2019   all teeth removed   TUBAL LIGATION     X3    Family Psychiatric History: See below  Family History:  Family History  Problem Relation Age of Onset   Hypertension Mother    Alcohol abuse Mother    Heart disease Mother    Kidney disease Mother    Aneurysm Mother        brain   Hypertension Father    Alcohol abuse Sister    Alcohol abuse Maternal Grandmother    Alcohol  abuse Maternal Grandfather    Hearing loss Daughter    Alcohol abuse Maternal Aunt    Alcohol abuse Paternal Aunt    Alcohol abuse Cousin    Stroke Other    Diabetes Other    Cancer Other    Seizures Other     Social History:  Social History   Socioeconomic History   Marital status: Married    Spouse name: TPieter Partridge  Number of children: 3   Years of education: 12   Highest education level: Not on file  Occupational History    Comment: [employer]    Comment: 3rd shift  Tobacco Use   Smoking status: Never   Smokeless tobacco: Never  Vaping Use   Vaping Use: Never used  Substance and Sexual Activity   Alcohol use: No    Alcohol/week: 0.0 standard drinks of alcohol   Drug use: No   Sexual activity: Not Currently    Birth control/protection: Surgical  Other Topics Concern   Not on  file  Social History Narrative   Married for 22 years.On disability secondary to schizophrenia.   Lives with husband.   Caffeine- decaf coffee, tea   Social Determinants of Health   Financial Resource Strain: Not on file  Food Insecurity: Not on file  Transportation Needs: Not on file  Physical Activity: Not on file  Stress: Not on file  Social Connections: Not on file    Allergies: No Known Allergies  Metabolic Disorder Labs: Lab Results  Component Value Date   HGBA1C 12.5 (H) 07/30/2021   MPG 332 07/30/2020   MPG  07/19/2019     Comment:     eAG cannot be calculated. Hemoglobin A1c result exceeds the linearity of the assay.    No results found for: "PROLACTIN" Lab Results  Component Value Date   CHOL 141 07/30/2021   TRIG 76 07/30/2021   HDL 71 07/30/2021   CHOLHDL 2.0 07/30/2021   LDLCALC 55 07/30/2021   LDLCALC 37 07/30/2020   Lab Results  Component Value Date   TSH 1.360 09/03/2021   TSH 0.289 (L) 07/30/2021    Therapeutic Level Labs: Lab Results  Component Value Date   LITHIUM <0.06 (L) 07/03/2016   LITHIUM 1.17 09/09/2015   No results found for: "VALPROATE" No  results found for: "CBMZ"  Current Medications: Current Outpatient Medications  Medication Sig Dispense Refill   albuterol (VENTOLIN HFA) 108 (90 Base) MCG/ACT inhaler Inhale 2 puffs into the lungs every 6 (six) hours as needed for wheezing or shortness of breath. 8 g 2   amLODipine (NORVASC) 2.5 MG tablet Take 1 tablet (2.5 mg total) by mouth daily. 90 tablet 0   aspirin EC 81 MG tablet Take 81 mg by mouth daily.     atorvastatin (LIPITOR) 10 MG tablet TAKE 1 TABLET BY MOUTH EVERYDAY AT BEDTIME 90 tablet 0   benztropine (COGENTIN) 1 MG tablet TAKE 1 TABLET(1 MG) BY MOUTH AT BEDTIME 90 tablet 2   Blood Glucose Monitoring Suppl (ACCU-CHEK GUIDE ME) w/Device KIT 1 Piece by Does not apply route as directed. 1 kit 0   budesonide-formoterol (SYMBICORT) 80-4.5 MCG/ACT inhaler INHALE 2 PUFFS INTO THE LUNGS TWICE A DAY 10.2 each 3   carvedilol (COREG) 12.5 MG tablet Take 1 tablet (12.5 mg total) by mouth 2 (two) times daily. 180 tablet 0   Cholecalciferol (VITAMIN D-3) 125 MCG (5000 UT) TABS Take 2 tablets by mouth daily. 30 tablet 1   clonazePAM (KLONOPIN) 0.5 MG tablet Take 1 tablet (0.5 mg total) by mouth 3 (three) times daily as needed for anxiety. 270 tablet 2   Continuous Blood Gluc Receiver (DEXCOM G7 RECEIVER) DEVI Used to monitor blood glucose DX: E11.65 1 each 0   Continuous Blood Gluc Sensor (DEXCOM G7 SENSOR) MISC Used to monitor blood glucose DX E11.65 1 each 3   Dulaglutide (TRULICITY) 3 EN/2.7PO SOPN Inject 3 mg as directed once a week. 2 mL 2   DULoxetine (CYMBALTA) 60 MG capsule Take 1 capsule (60 mg total) by mouth 2 (two) times daily. 180 capsule 2   gabapentin (NEURONTIN) 100 MG capsule Take 1 capsule (100 mg total) by mouth 3 (three) times daily. 90 capsule 3   glipiZIDE (GLUCOTROL XL) 5 MG 24 hr tablet TAKE 2 TABLETS (10MG) BY MOUTH DAILY 90 tablet 0   glucose blood (ACCU-CHEK GUIDE) test strip Use as instructed 150 each 2   insulin glargine, 2 Unit Dial, (TOUJEO MAX SOLOSTAR) 300  UNIT/ML Solostar Pen Inject 80 Units into the  skin at bedtime. 9 mL 2   levothyroxine (SYNTHROID) 75 MCG tablet Take 75 mcg by mouth daily before breakfast.     losartan (COZAAR) 100 MG tablet TAKE 1 TABLET BY MOUTH EVERY DAY 90 tablet 0   lubiprostone (AMITIZA) 24 MCG capsule Take 1 capsule (24 mcg total) by mouth 2 (two) times daily with a meal. 60 capsule 5   metoCLOPramide (REGLAN) 5 MG tablet Take 1 tablet (5 mg total) by mouth 4 (four) times daily -  before meals and at bedtime. 120 tablet 1   Multiple Vitamin (MULTIVITAMIN) tablet Take 1 tablet by mouth daily.     omeprazole (PRILOSEC) 40 MG capsule TAKE 1 CAPSULE BY MOUTH TWICE A DAY 60 capsule 3   ondansetron (ZOFRAN) 4 MG tablet Take 1 tablet (4 mg total) by mouth every 8 (eight) hours as needed for nausea or vomiting. 30 tablet 0   pantoprazole (PROTONIX) 40 MG tablet Take 40 mg by mouth daily.     Polyvinyl Alcohol-Povidone PF (REFRESH) 1.4-0.6 % SOLN Place 1 drop into both eyes daily as needed (dry eyes).     risperiDONE (RISPERDAL) 2 MG tablet TAKE 1 TABLET EVERY MORNING AND 3 TABLETS AT BEDTIME 360 tablet 3   rizatriptan (MAXALT) 10 MG tablet Take 1 tablet (10 mg total) by mouth as needed for migraine. May repeat in 2 hours if needed 10 tablet 3   sucralfate (CARAFATE) 1 GM/10ML suspension Take 10 mLs (1 g total) by mouth 4 (four) times daily -  with meals and at bedtime. 420 mL 1   topiramate (TOPAMAX) 25 MG tablet Take 25 mg (1 pill) at bedtime for one week, then increase to 50 mg (2 pills) at bedtime for one week, then take 75 mg (3 pills) at bedtime for one week, then take 100 mg (4 pills) at bedtime 120 tablet 3   traZODone (DESYREL) 100 MG tablet TAKE 2 TABLETS(200 MG) BY MOUTH AT BEDTIME 180 tablet 2   No current facility-administered medications for this visit.     Musculoskeletal: Strength & Muscle Tone: na Gait & Station: na Patient leans: N/A  Psychiatric Specialty Exam: Review of Systems  Psychiatric/Behavioral:   Positive for decreased concentration, dysphoric mood and sleep disturbance. The patient is nervous/anxious.   All other systems reviewed and are negative.   Last menstrual period 01/23/2013.There is no height or weight on file to calculate BMI.  General Appearance: NA  Eye Contact:  NA  Speech:  Clear and Coherent  Volume:  Normal  Mood:  Anxious and Depressed  Affect:  NA  Thought Process:  Goal Directed  Orientation:  Full (Time, Place, and Person)  Thought Content: Hallucinations: Auditory   Suicidal Thoughts:  No  Homicidal Thoughts:  No  Memory:  Immediate;   Good Recent;   Good Remote;   Fair  Judgement:  Good  Insight:  Fair  Psychomotor Activity:  Decreased  Concentration:  Concentration: Poor and Attention Span: Poor  Recall:  Good  Fund of Knowledge: Fair  Language: Good  Akathisia:  No  Handed:  Right  AIMS (if indicated): not done  Assets:  Communication Skills Desire for Improvement Resilience Social Support  ADL's:  Intact  Cognition: WNL  Sleep:  Fair   Screenings: PHQ2-9    Flowsheet Row Video Visit from 11/05/2021 in [facility] Office Visit from 10/31/2021 in [facility] Office Visit from 10/03/2021 in [facility] Office Visit from 08/29/2021 in [facility]  Video Visit from 08/16/2021 in [facility]  PHQ-2 Total Score 4 6 0 0 0  PHQ-9 Total Score 12 21 -- -- --      Flowsheet Row Video Visit from 08/16/2021 in [facility] from 07/17/2021 in [facility] Video Visit from 06/19/2021 in [facility] No Risk No Risk No Risk        Assessment and Plan: This patient is a 51 year old female with a history of schizoaffective disorder and PTSD.  Since the death of her granddaughter recently she has been more depressed  and anxious.  We will increase clonazepam to 0.5 mg 3 times daily for anxiety.  She will continue Risperdal 2 mg in the morning and 6 mg in the evening for schizophrenia symptoms, Cogentin 1 mg to prevent side effects from Risperdal, Cymbalta 60 mg twice daily for depression and trazodone 200 mg at bedtime for sleep.  She will return to see me in 4 weeks or call sooner as needed  Collaboration of Care: Collaboration of Care: Referral or follow-up with counselor/therapist [facility] will be referred for counseling in our office.  Patient/Guardian was advised Release of Information must be obtained prior to any record release in order to collaborate their care with an outside provider. Patient/Guardian was advised if they have not already done so to contact the registration department to sign all necessary forms in order for [place] to release information regarding their care.   Consent: Patient/Guardian gives verbal consent for treatment and assignment of benefits for services provided during this visit. Patient/Guardian expressed understanding and agreed to proceed.    Levonne Spiller, MD 11/12/2021, 1:02 PM

## 2021-11-13 ENCOUNTER — Other Ambulatory Visit: Payer: Self-pay | Admitting: Nurse Practitioner

## 2021-11-13 DIAGNOSIS — G44229 Chronic tension-type headache, not intractable: Secondary | ICD-10-CM

## 2021-11-18 ENCOUNTER — Other Ambulatory Visit: Payer: Self-pay

## 2021-11-18 ENCOUNTER — Telehealth: Payer: Self-pay

## 2021-11-18 DIAGNOSIS — E1140 Type 2 diabetes mellitus with diabetic neuropathy, unspecified: Secondary | ICD-10-CM | POA: Diagnosis not present

## 2021-11-18 DIAGNOSIS — L851 Acquired keratosis [keratoderma] palmaris et plantaris: Secondary | ICD-10-CM | POA: Diagnosis not present

## 2021-11-18 MED ORDER — AMLODIPINE BESYLATE 2.5 MG PO TABS: 2.5000 mg | ORAL_TABLET | Freq: Every day | ORAL | 2 refills | Status: DC

## 2021-11-18 NOTE — Telephone Encounter (Signed)
Rx sent 

## 2021-11-18 NOTE — Telephone Encounter (Signed)
Patient need refills  amLODipine (NORVASC) 2.5 MG tablet   [facility]

## 2021-11-20 ENCOUNTER — Telehealth: Payer: Self-pay | Admitting: Nurse Practitioner

## 2021-11-20 ENCOUNTER — Ambulatory Visit (HOSPITAL_COMMUNITY): Payer: BC Managed Care – PPO | Admitting: Psychiatry

## 2021-11-20 NOTE — Telephone Encounter (Signed)
Lanelle Bal with [facility], [phone],  called stating she was returning a call in regards to the clarification on the medical clearance. States they are needing the provider to review medical history due to BP for the extraction.

## 2021-11-20 NOTE — Telephone Encounter (Signed)
Spoke with natalie will have fola say if it's ok or not to do procedure done

## 2021-11-25 ENCOUNTER — Other Ambulatory Visit (INDEPENDENT_AMBULATORY_CARE_PROVIDER_SITE_OTHER): Payer: Self-pay | Admitting: Gastroenterology

## 2021-11-25 ENCOUNTER — Telehealth (INDEPENDENT_AMBULATORY_CARE_PROVIDER_SITE_OTHER): Payer: Self-pay | Admitting: *Deleted

## 2021-11-25 NOTE — Telephone Encounter (Signed)
Fax from [place]. Lubiprostone caps coverage is approved from 11/22/21 -11/22/24. I called patient and notified her and faxed letter of approval [facility].

## 2021-12-04 ENCOUNTER — Encounter: Payer: Self-pay | Admitting: Nurse Practitioner

## 2021-12-04 ENCOUNTER — Ambulatory Visit (INDEPENDENT_AMBULATORY_CARE_PROVIDER_SITE_OTHER): Payer: BC Managed Care – PPO | Admitting: Nurse Practitioner

## 2021-12-04 VITALS — BP 132/86 | HR 96 | Ht 60.0 in | Wt 164.0 lb

## 2021-12-04 DIAGNOSIS — Z794 Long term (current) use of insulin: Secondary | ICD-10-CM

## 2021-12-04 DIAGNOSIS — I10 Essential (primary) hypertension: Secondary | ICD-10-CM | POA: Diagnosis not present

## 2021-12-04 DIAGNOSIS — E1165 Type 2 diabetes mellitus with hyperglycemia: Secondary | ICD-10-CM

## 2021-12-04 MED ORDER — AMLODIPINE BESYLATE 5 MG PO TABS: 5.0000 mg | ORAL_TABLET | Freq: Every day | ORAL | 1 refills | Status: DC

## 2021-12-04 NOTE — Assessment & Plan Note (Addendum)
BP Readings from Last 3 Encounters:  12/04/21 132/86  10/31/21 122/80  10/08/21 (!) 156/93  Currently on carvedilol 12.5 mg twice daily, losartan 100 mg daily, amlodipine 2.5 mg daily. Since diastolic blood pressure is greater than 80 today patient told to start taking amlodipine 5 mg daily continue other medications blood pressure goal is less than 130/80 Follow-up in 2 weeks with a nurse to recheck her blood pressure DASH diet advised engage in regular exercises at least 150 minutes weekly.

## 2021-12-04 NOTE — Assessment & Plan Note (Signed)
Uncontrolled condition Patient missed her last appointment with endocrinologist Patient encouraged to get to the endocrinologist office today and reschedule the appointment The need to get her A1c under control to prevent the risk of heart attack, stroke kidney damage discussed with patient today she will verbalized understanding. She has the CGM in place states that her fasting sugars has been in the 200s She has been taking glipizide 5 mg daily instead of 10 mg that was ordered, trulicity '3mg'$  once weekly injection  Taking Levemir 80 units daily at bedtime Check A1c today, urine creatinine albumin labs today Avoid sugar sweets soda and follow-up with Endo

## 2021-12-04 NOTE — Progress Notes (Signed)
   Kathryn [name]     MRN: [id number]      DOB: [birth year]   HPI Kathryn [name] past medical history of essential hypertension, uncontrolled type 2 diabetes, polyneuropathy, obesity, mixed hyperlipidemia, vitamin D deficiency is here for to discuss her hypertension.  Hypertension.  Patient stated that she was at her dentist office a few weeks ago for a dental procedure, they could not proceed with the procedure because her blood pressure was elevated.  Patient reported that she took her blood pressure medication before going for that appointment.  She denies chest pain edema headache.       ROS Denies recent fever or chills. Denies sinus pressure, nasal congestion, ear pain or sore throat. Denies chest congestion, productive cough or wheezing. Denies chest pains, palpitations and leg swelling Denies abdominal pain, nausea, vomiting,diarrhea or constipation.   Denies dysuria, frequency, hesitancy or incontinence. Denies joint pain, swelling and limitation in mobility. Denies headaches, seizures, numbness, or tingling. Denies depression, anxiety or insomnia.    PE  BP 136/87 (BP Location: Left Arm, Patient Position: Sitting, Cuff Size: Large)   Pulse 96   Ht 5' (1.524 m)   Wt 164 lb (74.4 kg)   LMP 01/23/2013   SpO2 95%   BMI 32.03 kg/m   Patient alert and oriented and in no cardiopulmonary distress.  Chest: Clear to auscultation bilaterally.  CVS: S1, S2 no murmurs, no S3.Regular rate.  ABD: Soft non tender.   Ext: No edema  MS: Adequate ROM spine, shoulders, hips and knees.  Skin: Intact, no ulcerations or rash noted.  Psych: Good eye contact, normal affect. Memory intact not anxious or depressed appearing.   Assessment & Plan  Essential hypertension, benign BP Readings from Last 3 Encounters:  12/04/21 132/86  10/31/21 122/80  10/08/21 (!) 156/93  Currently on carvedilol 12.5 mg twice daily, losartan 100 mg daily, amlodipine 2.5 mg daily. Since diastolic  blood pressure is greater than 80 today patient told to start taking amlodipine 5 mg daily continue other medications blood pressure goal is less than 130/80 Follow-up in 2 weeks with a nurse to recheck her blood pressure DASH diet advised engage in regular exercises at least 150 minutes weekly.  Uncontrolled diabetes mellitus with hyperglycemia, with long-term current use of insulin ([place]) Uncontrolled condition Patient missed her last appointment with endocrinologist Patient encouraged to get to the endocrinologist office today and reschedule the appointment The need to get her A1c under control to prevent the risk of heart attack, stroke kidney damage discussed with patient today she will verbalized understanding. She has the CGM in place states that her fasting sugars has been in the 200s She has been taking glipizide 5 mg daily instead of 10 mg that was ordered Taking Levemir 80 units daily at bedtime Check A1c today, urine creatinine albumin labs today Avoid sugar sweets soda and follow-up with Endo

## 2021-12-04 NOTE — Assessment & Plan Note (Signed)
>>  ASSESSMENT AND PLAN FOR UNCONTROLLED DIABETES MELLITUS WITH HYPERGLYCEMIA, WITH LONG-TERM CURRENT USE OF INSULIN  ([facility]) WRITTEN ON 12/05/2021  8:14 AM BY Taylar Hartsough, THOMES SAUNDERS, FNP  Uncontrolled condition Patient missed her last appointment with endocrinologist Patient encouraged to get to the endocrinologist office today and reschedule the appointment The need to get her A1c under control to prevent the risk of heart attack, stroke kidney damage discussed with patient today she will verbalized understanding. She has the CGM in place states that her fasting sugars has been in the 200s She has been taking glipizide  5 mg daily instead of 10 mg that was ordered, trulicity  3mg  once weekly injection  Taking Levemir 80 units daily at bedtime Check A1c today, urine creatinine albumin labs today Avoid sugar sweets soda and follow-up with Endo

## 2021-12-04 NOTE — Patient Instructions (Addendum)
Please start taking amlodipine '5mg'$  daily , monitor your blood pressure daily at home, blood pressure goal is less than 130/80.    Please follow up with the nurse in 2 weeks for blood pressure check   It is important that you exercise regularly at least 30 minutes 5 times a week.  Think about what you will eat, plan ahead. Choose " clean, green, fresh or frozen" over canned, processed or packaged foods which are more sugary, salty and fatty. 70 to 75% of food eaten should be vegetables and fruit. Three meals at set times with snacks allowed between meals, but they must be fruit or vegetables. Aim to eat over a 12 hour period , example 7 am to 7 pm, and STOP after  your last meal of the day. Drink water,generally about 64 ounces per day, no other drink is as healthy. Fruit juice is best enjoyed in a healthy way, by EATING the fruit.  Thanks for choosing [facility], we consider it a privelige to serve you.      Please follw up with the bnurse in

## 2021-12-05 ENCOUNTER — Telehealth (HOSPITAL_COMMUNITY): Payer: BC Managed Care – PPO | Admitting: Psychiatry

## 2021-12-05 NOTE — Progress Notes (Signed)
Good job Kathryn [name], your A1C is much improved from 12.5 to 7.1 , please continue to take your medication as ordered, goal is for A1C less than 7. Avoid sugar sweets soda. Follow up with Dr Dorris Fetch as planned

## 2021-12-06 LAB — MICROALBUMIN / CREATININE URINE RATIO
Creatinine, Urine: 115.9 mg/dL
Microalb/Creat Ratio: 3 mg/g creat (ref 0–29)
Microalbumin, Urine: 3.4 ug/mL

## 2021-12-06 LAB — HEMOGLOBIN A1C
Est. average glucose Bld gHb Est-mCnc: 157 mg/dL
Hgb A1c MFr Bld: 7.1 % — ABNORMAL HIGH (ref 4.8–5.6)

## 2021-12-10 ENCOUNTER — Ambulatory Visit (INDEPENDENT_AMBULATORY_CARE_PROVIDER_SITE_OTHER): Payer: BC Managed Care – PPO | Admitting: Gastroenterology

## 2021-12-16 ENCOUNTER — Ambulatory Visit (HOSPITAL_COMMUNITY): Payer: BC Managed Care – PPO | Admitting: Psychiatry

## 2021-12-17 IMAGING — US US THYROID
1 series · 14 of 25 positions shown · non-contrast
Comparison: None.

CLINICAL DATA: Goiter

EXAM:
THYROID ULTRASOUND
TECHNIQUE: Ultrasound examination of the thyroid gland and adjacent soft
tissues was performed.

[Series 1: us thyroid · 0.06mm/px · 14 of 55 slices shown]
[im 1/55]
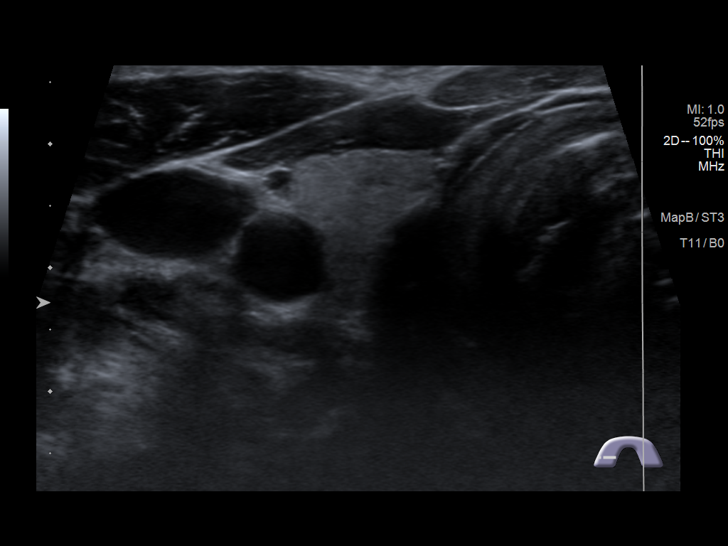
[im 5/55]
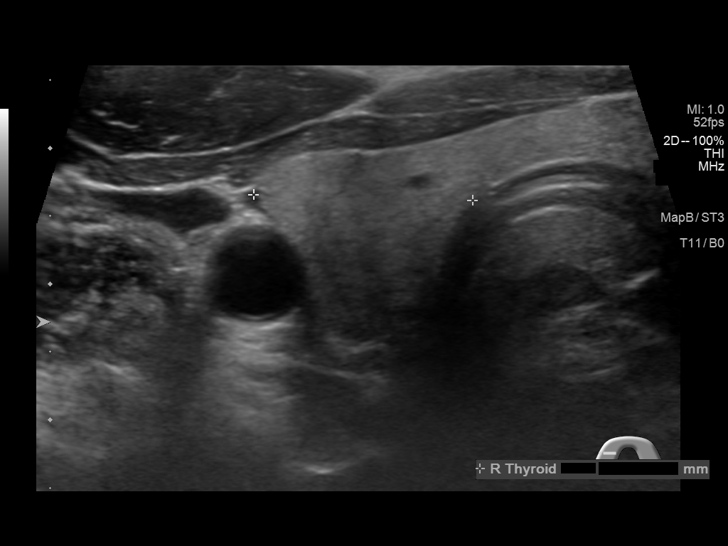
[im 10/55]
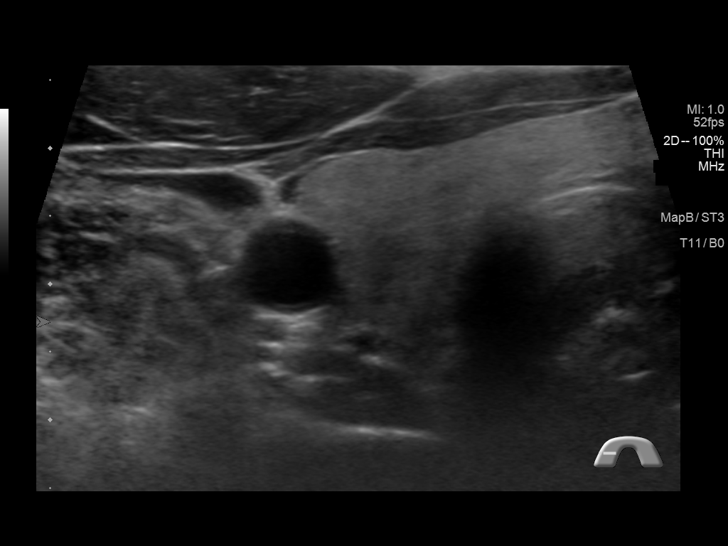
[im 14/55]
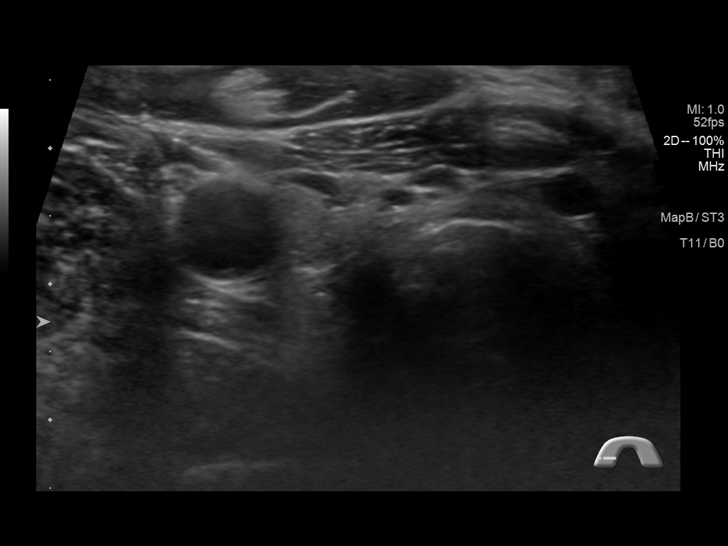
[im 19/55]
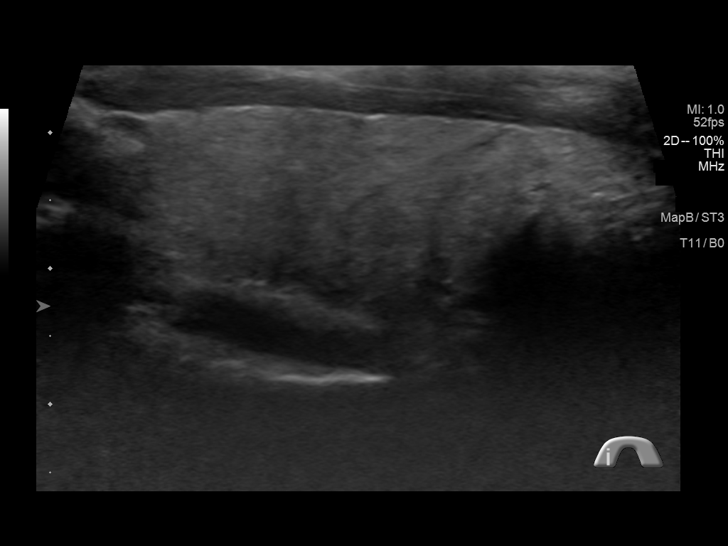
[im 21/55]
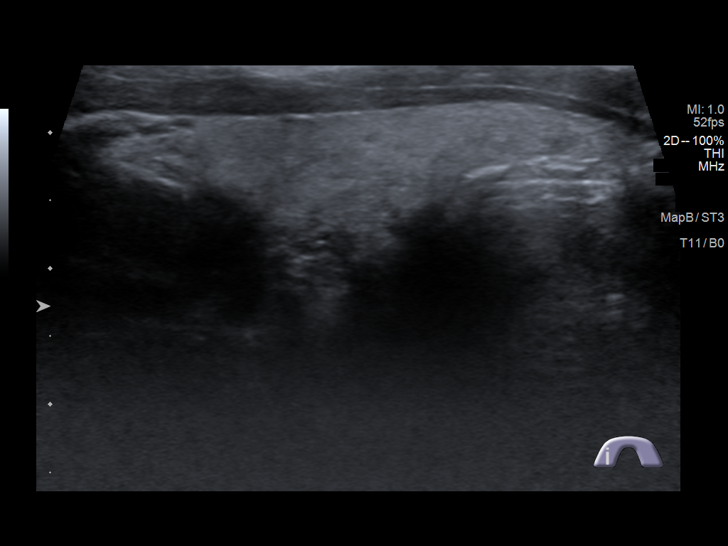
[im 25/55]
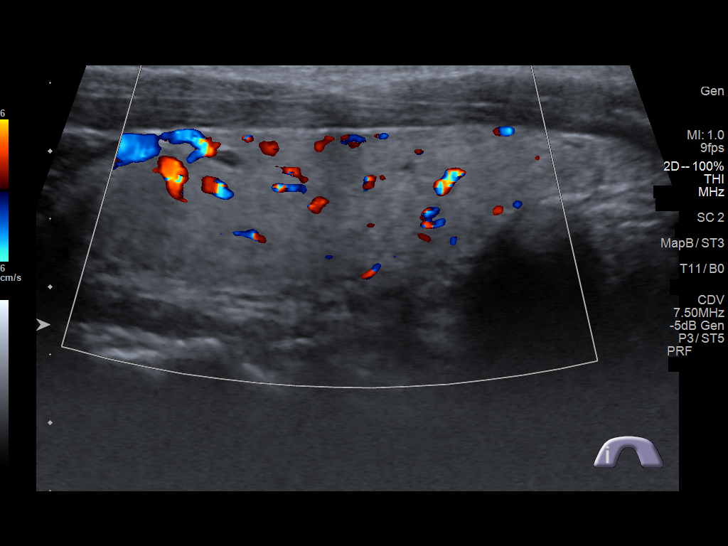
[im 30/55]
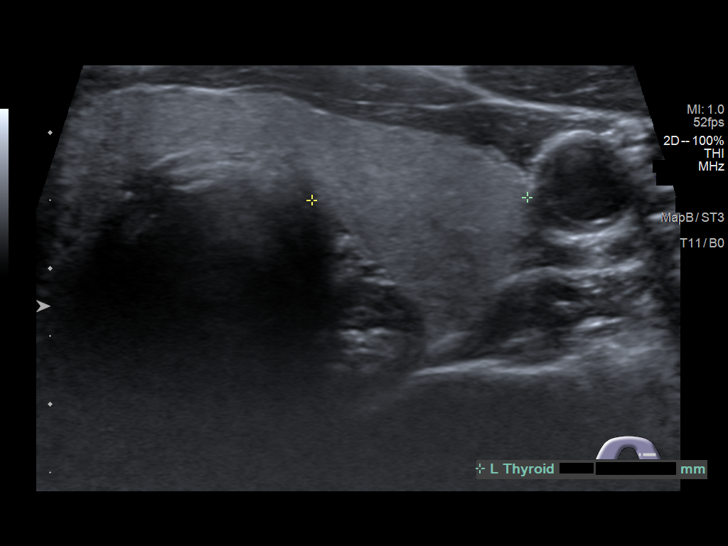
[im 34/55]
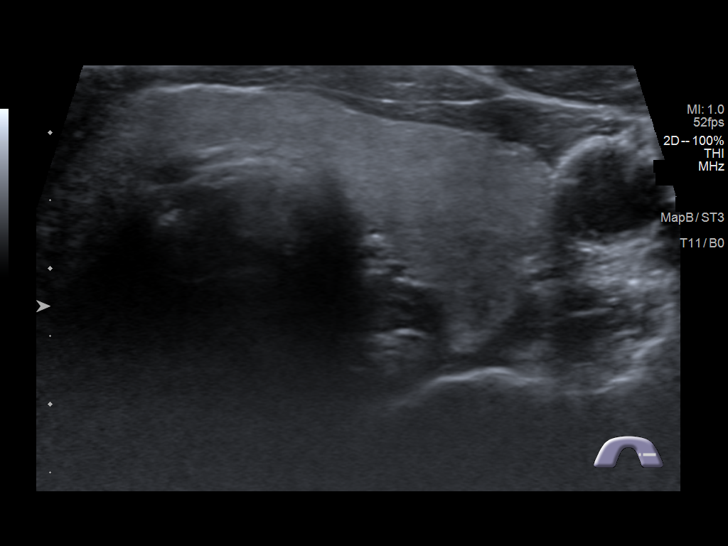
[im 37/55]
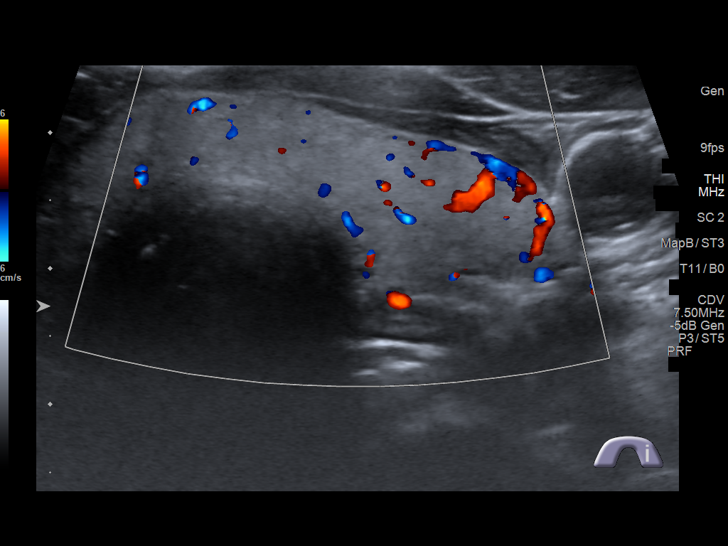
[im 41/55]
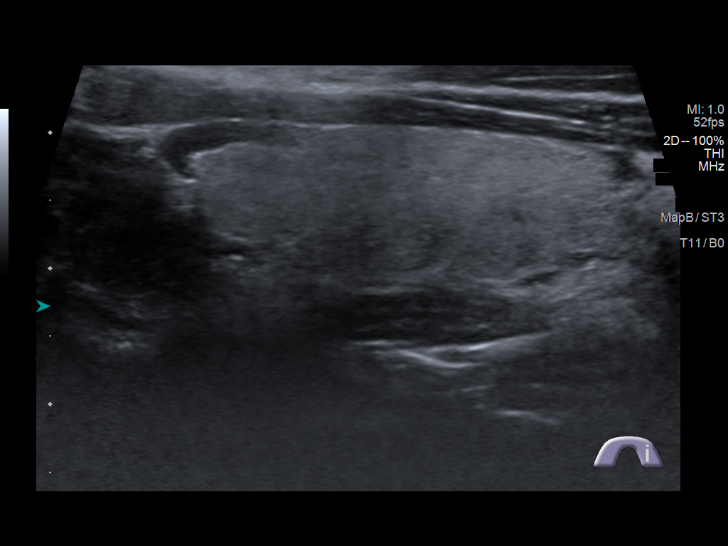
[im 46/55]
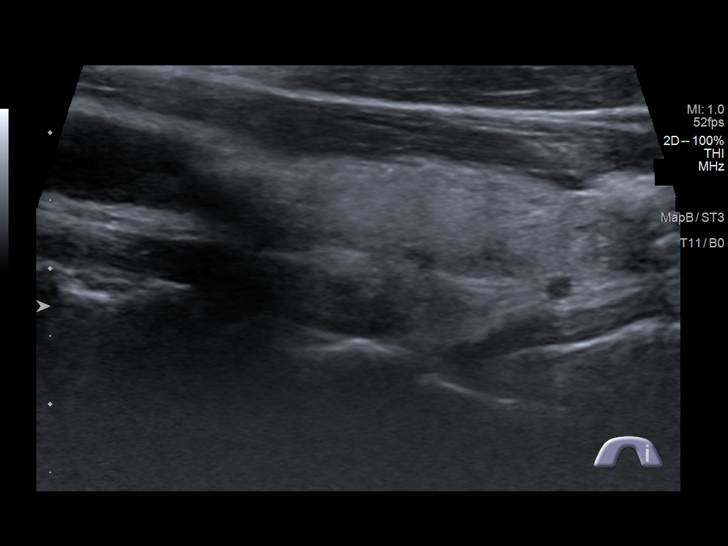
[im 50/55]
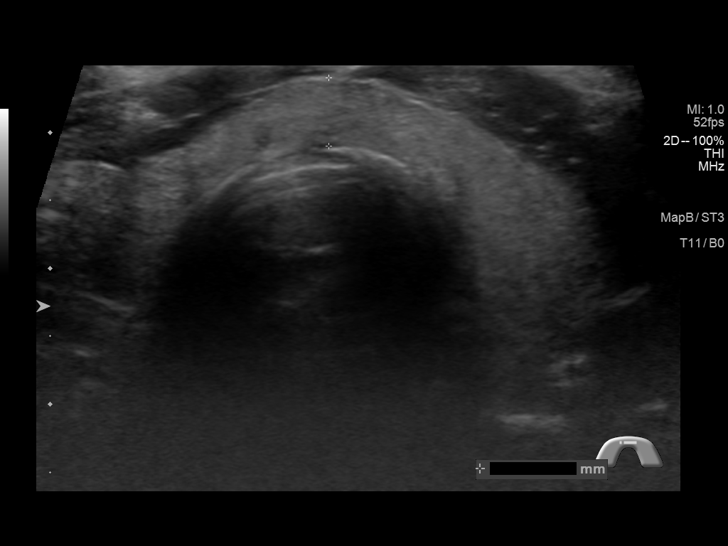
[im 55/55]
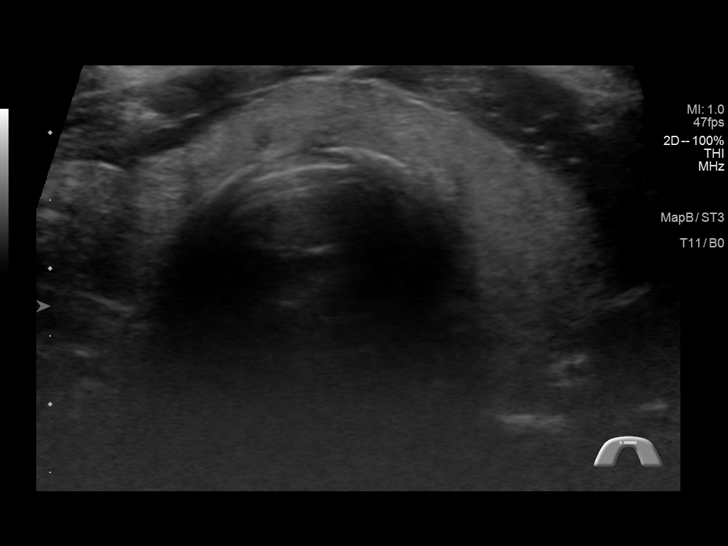

[14 of 25 positions shown; findings below may reference images not displayed]

FINDINGS: Parenchymal Echotexture: Normal

Isthmus: 0.5 cm

Right lobe: 4.1 x 1.5 x 1.6 cm

Left lobe: 3.3 x 1.4 x 1.6 cm

_________________________________________________________

Estimated total number of nodules >/= 1 cm: 0

Number of spongiform nodules >/=  2 cm not described below (TR1): 0

Number of mixed cystic and solid nodules >/= 1.5 cm not described
below (TR2): 0

_________________________________________________________

No discrete nodules are seen within the thyroid gland.
IMPRESSION: Normal-sized thyroid gland without evidence for distinct thyroid
nodule.

The above is in keeping with the ACR TI-RADS recommendations - [HOSPITAL] 7856;[DATE].

## 2021-12-18 ENCOUNTER — Other Ambulatory Visit: Payer: Self-pay | Admitting: Nurse Practitioner

## 2021-12-18 ENCOUNTER — Ambulatory Visit: Payer: BC Managed Care – PPO

## 2021-12-18 DIAGNOSIS — I10 Essential (primary) hypertension: Secondary | ICD-10-CM

## 2021-12-18 MED ORDER — ATORVASTATIN CALCIUM 10 MG PO TABS
ORAL_TABLET | ORAL | 0 refills | Status: DC
Start: 2021-12-18 — End: 2022-02-24

## 2021-12-20 ENCOUNTER — Ambulatory Visit: Payer: BC Managed Care – PPO | Admitting: Nurse Practitioner

## 2021-12-20 ENCOUNTER — Ambulatory Visit (INDEPENDENT_AMBULATORY_CARE_PROVIDER_SITE_OTHER): Payer: BC Managed Care – PPO

## 2021-12-20 ENCOUNTER — Other Ambulatory Visit: Payer: Self-pay | Admitting: Nurse Practitioner

## 2021-12-20 VITALS — BP 117/79

## 2021-12-20 DIAGNOSIS — I10 Essential (primary) hypertension: Secondary | ICD-10-CM | POA: Diagnosis not present

## 2021-12-20 NOTE — Progress Notes (Signed)
Bp check 

## 2021-12-25 ENCOUNTER — Ambulatory Visit: Payer: BC Managed Care – PPO | Admitting: "Endocrinology

## 2021-12-27 ENCOUNTER — Ambulatory Visit: Payer: BC Managed Care – PPO | Admitting: Nurse Practitioner

## 2021-12-29 ENCOUNTER — Other Ambulatory Visit: Payer: Self-pay | Admitting: Psychiatry

## 2021-12-29 ENCOUNTER — Other Ambulatory Visit: Payer: Self-pay | Admitting: Nurse Practitioner

## 2021-12-29 DIAGNOSIS — E782 Mixed hyperlipidemia: Secondary | ICD-10-CM

## 2021-12-29 DIAGNOSIS — E559 Vitamin D deficiency, unspecified: Secondary | ICD-10-CM

## 2021-12-29 DIAGNOSIS — E039 Hypothyroidism, unspecified: Secondary | ICD-10-CM

## 2021-12-29 DIAGNOSIS — I10 Essential (primary) hypertension: Secondary | ICD-10-CM

## 2021-12-29 DIAGNOSIS — E1165 Type 2 diabetes mellitus with hyperglycemia: Secondary | ICD-10-CM

## 2021-12-29 DIAGNOSIS — E1143 Type 2 diabetes mellitus with diabetic autonomic (poly)neuropathy: Secondary | ICD-10-CM

## 2022-01-02 ENCOUNTER — Ambulatory Visit (HOSPITAL_COMMUNITY): Payer: BC Managed Care – PPO | Admitting: Psychiatry

## 2022-01-03 ENCOUNTER — Encounter: Payer: Self-pay | Admitting: Nurse Practitioner

## 2022-01-03 ENCOUNTER — Ambulatory Visit: Payer: BC Managed Care – PPO | Admitting: Nurse Practitioner

## 2022-01-03 VITALS — BP 160/100 | HR 88

## 2022-01-03 DIAGNOSIS — I10 Essential (primary) hypertension: Secondary | ICD-10-CM

## 2022-01-03 DIAGNOSIS — M5416 Radiculopathy, lumbar region: Secondary | ICD-10-CM | POA: Diagnosis not present

## 2022-01-03 MED ORDER — KETOROLAC TROMETHAMINE 60 MG/2ML IM SOLN
60.0000 mg | Freq: Once | INTRAMUSCULAR | Status: AC
  Administered 2022-01-03: 60 mg via INTRAMUSCULAR

## 2022-01-03 NOTE — Progress Notes (Signed)
   Kathryn [name]     MRN: [id number]      DOB: [birth year]   HPI Kathryn [name] with past medical history of hypertension, type 2 diabetes, hyperlipidemia, lumbar radiculopathy, GERD, lumbar disc displacement is here for complaints of worsening low back pain bilateral hip pain since the last 2 weeks.  States that she has burning ,throbbing ,sharp pain 10/10.  It is hard to turn in the bed.  She has taken Tylenol but no improvement.  Patient denies incontinence.      ROS Denies recent fever or chills. Denies sinus pressure, nasal congestion, ear pain or sore throat. Denies chest congestion, productive cough or wheezing. Denies chest pains, palpitations and leg swelling Denies abdominal pain, nausea, vomiting,diarrhea or constipation.   Denies dysuria, frequency, hesitancy or incontinence. Denies headaches, seizures, numbness, or tingling. Denies depression, anxiety or insomnia.    PE  BP (!) 158/100 (BP Location: Left Arm, Cuff Size: Normal)   Pulse 88   LMP 01/23/2013   SpO2 97%   Patient alert and oriented and in no cardiopulmonary distress.  Chest: Clear to auscultation bilaterally.  CVS: S1, S2 no murmurs, no S3.Regular rate.  ABD: Soft non tender.    MS: decrease ROM spine, tenderness on palpation of low back and bilateral hips, bilateral lower extremities warm, has palpable pedal pulses, nonpitting edema noted  Skin: Intact, no ulcerations or rash noted.  Psych: Good eye contact, normal affect. Memory intact not anxious or depressed appearing.  CNS: CN 2-12 intact, power,  normal throughout.no focal deficits noted.   Assessment & Plan Essential hypertension, benign BP Readings from Last 3 Encounters:  01/03/22 (!) 160/100  12/20/21 117/79  12/04/21 132/86  Blood pressure is elevated in the office today due to pain Currently on amlodipine 5 mg daily, carvedilol 12.5 mg twice daily, losartan 100 mg daily, Continue current medications DASH diet advised Engage  in regular daily walking exercises at least 150 minutes weekly as tolerated  Lumbar radiculopathy Has been having worsening low back pain bilateral hip pain for the past 2 weeks Toradol 60 mg IM injection given in the office today Take Tylenol 650 mg every 6 hours as needed Follow-up with spine and neurosurgery if pain does not improve

## 2022-01-03 NOTE — Assessment & Plan Note (Signed)
BP Readings from Last 3 Encounters:  01/03/22 (!) 160/100  12/20/21 117/79  12/04/21 132/86  Blood pressure is elevated in the office today due to pain Currently on amlodipine 5 mg daily, carvedilol 12.5 mg twice daily, losartan 100 mg daily, Continue current medications DASH diet advised Engage in regular daily walking exercises at least 150 minutes weekly as tolerated

## 2022-01-03 NOTE — Assessment & Plan Note (Signed)
Has been having worsening low back pain bilateral hip pain for the past 2 weeks Toradol 60 mg IM injection given in the office today Take Tylenol 650 mg every 6 hours as needed Follow-up with spine and neurosurgery if pain does not improve

## 2022-01-03 NOTE — Patient Instructions (Signed)
You were given Toradol injection today , please take tylenol 650 mg every 6 hours as needed for your back pain     It is important that you exercise regularly at least 30 minutes 5 times a week.  Think about what you will eat, plan ahead. Choose " clean, green, fresh or frozen" over canned, processed or packaged foods which are more sugary, salty and fatty. 70 to 75% of food eaten should be vegetables and fruit. Three meals at set times with snacks allowed between meals, but they must be fruit or vegetables. Aim to eat over a 12 hour period , example 7 am to 7 pm, and STOP after  your last meal of the day. Drink water,generally about 64 ounces per day, no other drink is as healthy. Fruit juice is best enjoyed in a healthy way, by EATING the fruit.  Thanks for choosing [facility], we consider it a privelige to serve you.

## 2022-01-06 ENCOUNTER — Other Ambulatory Visit (HOSPITAL_COMMUNITY): Payer: Self-pay | Admitting: Psychiatry

## 2022-01-06 ENCOUNTER — Telehealth: Payer: Self-pay | Admitting: Nurse Practitioner

## 2022-01-06 NOTE — Telephone Encounter (Signed)
Pt decided to call back tomorrow when her dr is in office

## 2022-01-06 NOTE — Telephone Encounter (Signed)
Call for appt, she missed last one

## 2022-01-08 ENCOUNTER — Encounter: Payer: Self-pay | Admitting: Nurse Practitioner

## 2022-01-08 ENCOUNTER — Ambulatory Visit (INDEPENDENT_AMBULATORY_CARE_PROVIDER_SITE_OTHER): Payer: BC Managed Care – PPO | Admitting: Nurse Practitioner

## 2022-01-08 VITALS — BP 138/80 | HR 84 | Ht 60.0 in | Wt 162.0 lb

## 2022-01-08 DIAGNOSIS — K589 Irritable bowel syndrome without diarrhea: Secondary | ICD-10-CM

## 2022-01-08 DIAGNOSIS — R110 Nausea: Secondary | ICD-10-CM

## 2022-01-08 DIAGNOSIS — E113293 Type 2 diabetes mellitus with mild nonproliferative diabetic retinopathy without macular edema, bilateral: Secondary | ICD-10-CM | POA: Diagnosis not present

## 2022-01-08 DIAGNOSIS — R062 Wheezing: Secondary | ICD-10-CM | POA: Diagnosis not present

## 2022-01-08 DIAGNOSIS — Z6830 Body mass index (BMI) 30.0-30.9, adult: Secondary | ICD-10-CM

## 2022-01-08 NOTE — Assessment & Plan Note (Signed)
Currently on Reglan 5 mg 4 times daily Continue current medications I have no concern for infection today

## 2022-01-08 NOTE — Assessment & Plan Note (Signed)
Wt Readings from Last 3 Encounters:  01/08/22 162 lb (73.5 kg)  12/04/21 164 lb (74.4 kg)  10/31/21 160 lb (72.6 kg)  Patient counseled on low-carb diet encouraged to engage in regular moderate exercises at least 150 minutes weekly

## 2022-01-08 NOTE — Assessment & Plan Note (Signed)
Take albuterol inhaler 2 puffs every 6 hours as needed for wheezing.

## 2022-01-08 NOTE — Progress Notes (Signed)
   CHRISTINE MORTON     MRN: [id number]      DOB: 06-Feb-1971   HPI Ms. Bisono with past medical history of nausea vomiting, essential hypertension, hypothyroidism, type 2 diabetes, schizo affective disorder is here for complaints of nausea that started 3 days ago, they were recently told that their water coolers at work has mold in it, did not tell them how long the mould was there  She has been naused since 3 days ago,she denies vomitting, diarhea, fever, chills, bloody stool. Has mild abdominal tenderness. She also started wheezing 3 days ago, has cough that started last night ,she has been using albuterol inhaler twice daily as needed, denies SOB, chest tightness        ROS Denies recent fever or chills. Denies sinus pressure, nasal congestion, ear pain or sore throat. Denies chest pains, palpitations and leg swelling Denies dysuria, frequency, hesitancy or incontinence. Denies joint pain, swelling and limitation in mobility. Denies headaches, seizures, numbness, or tingling. Denies depression, anxiety or insomnia.    PE  BP 138/80 (BP Location: Left Arm, Cuff Size: Normal)   Pulse 84   Ht 5' (1.524 m)   Wt 162 lb (73.5 kg)   LMP 01/23/2013   SpO2 95%   BMI 31.64 kg/m   Patient alert and oriented and in no cardiopulmonary distress.  HEENT: No facial asymmetry, EOMI,     Neck supple .  Chest: Clear to auscultation bilaterally.  CVS: S1, S2 no murmurs, no S3.Regular rate.  ABD: Soft , no masses palpated, no distention noted, has mild  generalized abdominal tenderness.   Ext: No edema  MS: Adequate ROM spine, shoulders, hips and knees.  Psych: Good eye contact, normal affect. Memory intact not anxious or depressed appearing.  CNS: CN 2-12 intact, power,  normal throughout.no focal deficits noted.   Assessment & Plan  IBS (irritable bowel syndrome) She is followed by GI On Reglan 5 mg 3 times daily before meals and at bedtime Protonix 40 mg daily  Carafate 1 g  3 times daily with meals and bedtime Continue current medications Patient encouraged to maintain close follow-up with GI  Nausea Currently on Reglan 5 mg 4 times daily Continue current medications I have no concern for infection today  Wheezing Take albuterol inhaler 2 puffs every 6 hours as needed for wheezing.  Body mass index (BMI) 30.0-30.9, adult Wt Readings from Last 3 Encounters:  01/08/22 162 lb (73.5 kg)  12/04/21 164 lb (74.4 kg)  10/31/21 160 lb (72.6 kg)  Patient counseled on low-carb diet encouraged to engage in regular moderate exercises at least 150 minutes weekly

## 2022-01-08 NOTE — Patient Instructions (Signed)
Please take reglan '5mg'$   four times daily as ordered by your stomach doctor.  Use  albuterol inhaler every 6 hours as needed for wheezing.   It is important that you exercise regularly at least 30 minutes 5 times a week.  Think about what you will eat, plan ahead. Choose " clean, green, fresh or frozen" over canned, processed or packaged foods which are more sugary, salty and fatty. 70 to 75% of food eaten should be vegetables and fruit. Three meals at set times with snacks allowed between meals, but they must be fruit or vegetables. Aim to eat over a 12 hour period , example 7 am to 7 pm, and STOP after  your last meal of the day. Drink water,generally about 64 ounces per day, no other drink is as healthy. Fruit juice is best enjoyed in a healthy way, by EATING the fruit.  Thanks for choosing [facility], we consider it a privelige to serve you.

## 2022-01-08 NOTE — Assessment & Plan Note (Signed)
She is followed by GI On Reglan 5 mg 3 times daily before meals and at bedtime Protonix 40 mg daily  Carafate 1 g 3 times daily with meals and bedtime Continue current medications Patient encouraged to maintain close follow-up with GI

## 2022-01-10 ENCOUNTER — Encounter (HOSPITAL_COMMUNITY): Payer: Self-pay

## 2022-01-10 ENCOUNTER — Telehealth (HOSPITAL_COMMUNITY): Payer: BC Managed Care – PPO | Admitting: Psychiatry

## 2022-01-16 ENCOUNTER — Ambulatory Visit: Payer: BC Managed Care – PPO | Admitting: "Endocrinology

## 2022-01-18 ENCOUNTER — Other Ambulatory Visit: Payer: Self-pay | Admitting: Nurse Practitioner

## 2022-01-18 DIAGNOSIS — E559 Vitamin D deficiency, unspecified: Secondary | ICD-10-CM

## 2022-01-18 DIAGNOSIS — E1143 Type 2 diabetes mellitus with diabetic autonomic (poly)neuropathy: Secondary | ICD-10-CM

## 2022-01-18 DIAGNOSIS — E039 Hypothyroidism, unspecified: Secondary | ICD-10-CM

## 2022-01-18 DIAGNOSIS — I10 Essential (primary) hypertension: Secondary | ICD-10-CM

## 2022-01-18 DIAGNOSIS — E1165 Type 2 diabetes mellitus with hyperglycemia: Secondary | ICD-10-CM

## 2022-01-18 DIAGNOSIS — Z794 Long term (current) use of insulin: Secondary | ICD-10-CM

## 2022-01-18 DIAGNOSIS — E782 Mixed hyperlipidemia: Secondary | ICD-10-CM

## 2022-01-27 ENCOUNTER — Other Ambulatory Visit: Payer: Self-pay | Admitting: Psychiatry

## 2022-01-27 ENCOUNTER — Other Ambulatory Visit (INDEPENDENT_AMBULATORY_CARE_PROVIDER_SITE_OTHER): Payer: Self-pay | Admitting: Gastroenterology

## 2022-01-28 ENCOUNTER — Encounter: Payer: Self-pay | Admitting: Family Medicine

## 2022-01-28 ENCOUNTER — Ambulatory Visit (INDEPENDENT_AMBULATORY_CARE_PROVIDER_SITE_OTHER): Payer: BC Managed Care – PPO | Admitting: Family Medicine

## 2022-01-28 VITALS — BP 138/88 | HR 99 | Ht 60.0 in | Wt 161.1 lb

## 2022-01-28 DIAGNOSIS — G43109 Migraine with aura, not intractable, without status migrainosus: Secondary | ICD-10-CM

## 2022-01-28 DIAGNOSIS — E1165 Type 2 diabetes mellitus with hyperglycemia: Secondary | ICD-10-CM

## 2022-01-28 DIAGNOSIS — R110 Nausea: Secondary | ICD-10-CM

## 2022-01-28 DIAGNOSIS — R112 Nausea with vomiting, unspecified: Secondary | ICD-10-CM

## 2022-01-28 DIAGNOSIS — I10 Essential (primary) hypertension: Secondary | ICD-10-CM | POA: Diagnosis not present

## 2022-01-28 DIAGNOSIS — F419 Anxiety disorder, unspecified: Secondary | ICD-10-CM

## 2022-01-28 DIAGNOSIS — Z23 Encounter for immunization: Secondary | ICD-10-CM

## 2022-01-28 DIAGNOSIS — G629 Polyneuropathy, unspecified: Secondary | ICD-10-CM

## 2022-01-28 MED ORDER — CARVEDILOL 12.5 MG PO TABS: 12.5000 mg | ORAL_TABLET | Freq: Two times a day (BID) | ORAL | 0 refills | Status: DC

## 2022-01-28 MED ORDER — ONDANSETRON HCL 4 MG PO TABS: 4.0000 mg | ORAL_TABLET | Freq: Three times a day (TID) | ORAL | 3 refills | Status: DC | PRN

## 2022-01-28 MED ORDER — CARVEDILOL 12.5 MG PO TABS
12.5000 mg | ORAL_TABLET | Freq: Two times a day (BID) | ORAL | 0 refills | Status: DC
Start: 2022-01-28 — End: 2022-01-28

## 2022-01-28 NOTE — Telephone Encounter (Signed)
Last OV with you on 09/10/21. Next visit 02/27/22

## 2022-01-28 NOTE — Assessment & Plan Note (Addendum)
D/c gabapentin and encouraged patient to continue taking Cymbalta '60mg'$  BID

## 2022-01-28 NOTE — Assessment & Plan Note (Signed)
Encouraged pt to f/u with her neurologist for an alternative to Rizatriptan

## 2022-01-28 NOTE — Patient Instructions (Signed)
I appreciate the opportunity to provide care to you today!    Follow up:  with Fola as needed   Please pick up your refill at the pharmacy and continue taking Cymbalta for your depression and neuropathic pain    Please continue to a heart-healthy diet and increase your physical activities. Try to exercise for 22mns at least three times a week.      It was a pleasure to see you and I look forward to continuing to work together on your health and well-being. Please do not hesitate to call the office if you need care or have questions about your care.   Have a wonderful day and week. With Gratitude, GAlvira MondayMSN, FNP-BC

## 2022-01-28 NOTE — Assessment & Plan Note (Signed)
>>  ASSESSMENT AND PLAN FOR ESSENTIAL (PRIMARY) HYPERTENSION WRITTEN ON 01/28/2022 11:12 AM BY ZARWOLO, GLORIA, FNP  Refilled Carvedilol  12.5 mg BID

## 2022-01-28 NOTE — Assessment & Plan Note (Signed)
Refilled Carvedilol 12.5 mg BID

## 2022-01-28 NOTE — Assessment & Plan Note (Signed)
Encouraged pt to f/u with her psychiatrist for an alternative to clonazepam for her anxiety

## 2022-01-28 NOTE — Assessment & Plan Note (Signed)
D/c Reglan and ordered Zofran '4mg'$  Encouraged pt to start taking Zofran for nausea

## 2022-01-28 NOTE — Progress Notes (Signed)
mbaltal  Established Patient Office Visit  Subjective:  Patient ID: Kathryn [name], female    DOB: 04/02/1971  Age: 51 y.o. MRN: [id number]  CC:  Chief Complaint  Patient presents with   Medication Problem    Pt states metoclopramide and gabapentin affect her heart and needs a med change.     HPI Kathryn [name] is a 51 y.o. female with past medical history of T2DM, Essential hypertension presents for medication review.  She noted being told by the pharmacy that a few medications affect her heart and would like an alternative. These medications are Clonazepam, Rizatriptan, Gabaoentin, and Reglan. No cardiac or GI concerns were reported today.   Past Medical History:  Diagnosis Date   Anemia    Anxiety    Asthma    Depression    GERD (gastroesophageal reflux disease)    HLD (hyperlipidemia) 04/07/2019   Hypertension    Hypothyroidism, adult 04/07/2019   Neuropathy    Obesity (BMI 30.0-34.9) 04/07/2019   Schizoaffective disorder ([place])    Type II diabetes mellitus, uncontrolled 04/27/2019    Past Surgical History:  Procedure Laterality Date   BACK SURGERY  09/06/2020   BIOPSY  05/03/2021   Procedure: BIOPSY;  Surgeon: [name], MD;  Location: [facility];  Service: Gastroenterology;;   BREAST SURGERY Right    biopsy   CESAREAN SECTION     CHOLECYSTECTOMY  06/02/2012   Procedure: LAPAROSCOPIC CHOLECYSTECTOMY;  Surgeon: Jamesetta So, MD;  Location: [facility];  Service: General;  Laterality: N/A;  Attempted laparoscopic cholecystectomy   CHOLECYSTECTOMY  06/02/2012   Procedure: CHOLECYSTECTOMY;  Surgeon: Jamesetta So, MD;  Location: [facility];  Service: General;  Laterality: N/A;  converted to open at  4917   COLONOSCOPY WITH PROPOFOL N/A 07/18/2020   prep was fair, one 82m polyp (inflammatory) stool in descending colon, transverse and ascending, distal rectum and anal verge normal   ESOPHAGEAL DILATION  12/31/2018   Procedure: ESOPHAGEAL DILATION;  Surgeon:  RRogene Houston MD;  Location: [facility];  Service: Endoscopy;;   ESOPHAGOGASTRODUODENOSCOPY (EGD) WITH PROPOFOL N/A 08/24/2015   Procedure: ESOPHAGOGASTRODUODENOSCOPY (EGD) WITH PROPOFOL;  Surgeon: NRogene Houston MD;  [facility]: [facility];  Service: Endoscopy;  Laterality: N/A;  1:10 - Ann to notify pt to arrive at 11:30   ESOPHAGOGASTRODUODENOSCOPY (EGD) WITH PROPOFOL N/A 12/31/2018   normal, no abnormality to explain dysphagia, esophagus dilated.   ESOPHAGOGASTRODUODENOSCOPY (EGD) WITH PROPOFOL N/A 05/03/2021   Procedure: ESOPHAGOGASTRODUODENOSCOPY (EGD) WITH PROPOFOL;  Surgeon: CHarvel Quale MD;  [facility]: [facility];  Service: Gastroenterology;  Laterality: N/A;  1:20   FLEXIBLE SIGMOIDOSCOPY  07/17/2020   Procedure: FLEXIBLE SIGMOIDOSCOPY;  Surgeon: CHarvel Quale MD;  [facility]: [facility];  Service: Gastroenterology;;   POLYPECTOMY  07/18/2020   Procedure: POLYPECTOMY INTESTINAL;  Surgeon: CHarvel Quale MD;  [facility];  Service: Gastroenterology;;  ascending colon polyp;    SAVORY DILATION  05/03/2021   Procedure: SAVORY DILATION;  Surgeon: CHarvel Quale MD;  Location: [facility];  Service: Gastroenterology;;   tooth removal  2019   all teeth removed   TUBAL LIGATION     X3    Family History  Problem Relation Age of Onset   Hypertension Mother    Alcohol abuse Mother    Heart disease Mother    Kidney disease Mother    Aneurysm Mother        brain   Hypertension Father  Alcohol abuse Sister    Alcohol abuse Maternal Grandmother    Alcohol abuse Maternal Grandfather    Hearing loss Daughter    Alcohol abuse Maternal Aunt    Alcohol abuse Paternal Aunt    Alcohol abuse Cousin    Stroke Other    Diabetes Other    Cancer Other    Seizures Other     Social History   Socioeconomic History   Marital status: Married    Spouse name: Pieter Partridge   Number of children: 3   Years of education: 12    Highest education level: Not on file  Occupational History    Comment: [employer]    Comment: 3rd shift  Tobacco Use   Smoking status: Never   Smokeless tobacco: Never  Vaping Use   Vaping Use: Never used  Substance and Sexual Activity   Alcohol use: No    Alcohol/week: 0.0 standard drinks of alcohol   Drug use: No   Sexual activity: Not Currently    Birth control/protection: Surgical  Other Topics Concern   Not on file  Social History Narrative   Married for 22 years.On disability secondary to schizophrenia.   Lives with husband.   Caffeine- decaf coffee, tea   Social Determinants of Health   Financial Resource Strain: Not on file  Food Insecurity: Not on file  Transportation Needs: Not on file  Physical Activity: Not on file  Stress: Not on file  Social Connections: Not on file  Intimate Partner Violence: Not on file    Outpatient Medications Prior to Visit  Medication Sig Dispense Refill   albuterol (VENTOLIN HFA) 108 (90 Base) MCG/ACT inhaler Inhale 2 puffs into the lungs every 6 (six) hours as needed for wheezing or shortness of breath. 8 g 2   amLODipine (NORVASC) 5 MG tablet Take 1 tablet (5 mg total) by mouth daily. 90 tablet 1   aspirin EC 81 MG tablet Take 81 mg by mouth daily.     atorvastatin (LIPITOR) 10 MG tablet TAKE 1 TABLET BY MOUTH EVERYDAY AT BEDTIME 90 tablet 0   benztropine (COGENTIN) 1 MG tablet TAKE 1 TABLET(1 MG) BY MOUTH AT BEDTIME 90 tablet 2   Blood Glucose Monitoring Suppl (ACCU-CHEK GUIDE ME) w/Device KIT 1 Piece by Does not apply route as directed. 1 kit 0   budesonide-formoterol (SYMBICORT) 80-4.5 MCG/ACT inhaler INHALE 2 PUFFS INTO THE LUNGS TWICE A DAY 10.2 each 3   Cholecalciferol (VITAMIN D-3) 125 MCG (5000 UT) TABS Take 2 tablets by mouth daily. 30 tablet 1   Continuous Blood Gluc Receiver (DEXCOM G7 RECEIVER) DEVI Used to monitor blood glucose DX: E11.65 1 each 0   Continuous Blood Gluc Sensor (DEXCOM G7 SENSOR) MISC Used to monitor blood  glucose DX E11.65 1 each 3   Dulaglutide (TRULICITY) 3 HY/0.7PX SOPN Inject 3 mg as directed once a week. 2 mL 2   DULoxetine (CYMBALTA) 60 MG capsule Take 1 capsule (60 mg total) by mouth 2 (two) times daily. 180 capsule 2   glipiZIDE (GLUCOTROL XL) 5 MG 24 hr tablet TAKE 2 TABLETS (10MG) BY MOUTH DAILY 30 tablet 2   glucose blood (ACCU-CHEK GUIDE) test strip Use as instructed 150 each 2   insulin glargine, 2 Unit Dial, (TOUJEO MAX SOLOSTAR) 300 UNIT/ML Solostar Pen Inject 80 Units into the skin at bedtime. 9 mL 2   levothyroxine (SYNTHROID) 75 MCG tablet Take 75 mcg by mouth daily before breakfast.     losartan (COZAAR) 100 MG tablet  TAKE 1 TABLET BY MOUTH EVERY DAY 90 tablet 0   lubiprostone (AMITIZA) 24 MCG capsule Take 1 capsule (24 mcg total) by mouth 2 (two) times daily with a meal. 60 capsule 5   Multiple Vitamin (MULTIVITAMIN) tablet Take 1 tablet by mouth daily.     omeprazole (PRILOSEC) 40 MG capsule TAKE 1 CAPSULE BY MOUTH TWICE A DAY 60 capsule 3   pantoprazole (PROTONIX) 40 MG tablet Take 40 mg by mouth daily.     Polyvinyl Alcohol-Povidone PF (REFRESH) 1.4-0.6 % SOLN Place 1 drop into both eyes daily as needed (dry eyes).     risperiDONE (RISPERDAL) 2 MG tablet TAKE 1 TABLET EVERY MORNING AND 3 TABLETS AT BEDTIME 360 tablet 3   rizatriptan (MAXALT) 10 MG tablet TAKE 1 TABLET BY MOUTH AS NEEDED FOR MIGRAINE. MAY REPEAT IN 2 HOURS IF NEEDED 10 tablet 3   sucralfate (CARAFATE) 1 GM/10ML suspension Take 10 mLs (1 g total) by mouth 4 (four) times daily -  with meals and at bedtime. 420 mL 1   SUMAtriptan (IMITREX) 50 MG tablet TAKE 1 TABLET BY MOUTH EVERY 2 HOURS AS NEEDED FOR MIGRAINE. MAY REPEAT IN 2 HOURS IF HEADACHE PERSISTS OR RECURS. 3 tablet 3   topiramate (TOPAMAX) 25 MG tablet TAKE 25 MG (1 PILL) AT BEDTIME FOR ONE WEEK, THEN INCREASE TO 50 MG (2 PILLS) AT BEDTIME FOR ONE WEEK, THEN TAKE 75 MG (3 PILLS) AT BEDTIME FOR ONE WEEK, THEN TAKE 100 MG (4 PILLS) AT BEDTIME 120 tablet 3    traZODone (DESYREL) 100 MG tablet TAKE 2 TABLETS(200 MG) BY MOUTH AT BEDTIME 180 tablet 2   carvedilol (COREG) 12.5 MG tablet Take 1 tablet (12.5 mg total) by mouth 2 (two) times daily. 180 tablet 0   gabapentin (NEURONTIN) 100 MG capsule Take 1 capsule (100 mg total) by mouth 3 (three) times daily. 90 capsule 3   ondansetron (ZOFRAN) 4 MG tablet Take 1 tablet (4 mg total) by mouth every 8 (eight) hours as needed for nausea or vomiting. 30 tablet 0   clonazePAM (KLONOPIN) 0.5 MG tablet TAKE 1 TABLET BY MOUTH 2 TIMES DAILY AS NEEDED FOR ANXIETY. (Patient not taking: Reported on 01/28/2022) 180 tablet 0   metoCLOPramide (REGLAN) 5 MG tablet TAKE 1 TABLET (5 MG TOTAL) BY MOUTH 4 (FOUR) TIMES DAILY - BEFORE MEALS AND AT BEDTIME. (Patient not taking: Reported on 01/28/2022) 120 tablet 1   No facility-administered medications prior to visit.    No Known Allergies  ROS Review of Systems  Constitutional:  Negative for fatigue and fever.  Respiratory:  Negative for chest tightness, shortness of breath and wheezing.   Cardiovascular:  Negative for chest pain, palpitations and leg swelling.  Gastrointestinal:  Negative for diarrhea, nausea and vomiting.  Psychiatric/Behavioral:  Negative for behavioral problems, self-injury, sleep disturbance and suicidal ideas.       Objective:    Physical Exam HENT:     Head: Normocephalic.     Nose: No congestion.  Cardiovascular:     Rate and Rhythm: Normal rate and regular rhythm.     Pulses: Normal pulses.     Heart sounds: Normal heart sounds.  Pulmonary:     Effort: Pulmonary effort is normal.     Breath sounds: Normal breath sounds.  Musculoskeletal:     Cervical back: No rigidity.  Neurological:     Mental Status: She is alert.     BP 138/88   Pulse 99   Ht 5' (1.524  m)   Wt 161 lb 1.3 oz (73.1 kg)   LMP 01/23/2013   SpO2 98%   BMI 31.46 kg/m  Wt Readings from Last 3 Encounters:  01/28/22 161 lb 1.3 oz (73.1 kg)  01/08/22 162 lb (73.5  kg)  12/04/21 164 lb (74.4 kg)    Lab Results  Component Value Date   TSH 1.360 09/03/2021   Lab Results  Component Value Date   WBC 6.9 07/17/2021   HGB 12.9 07/17/2021   HCT 37.1 07/17/2021   MCV 83.7 07/17/2021   PLT 275 07/17/2021   Lab Results  Component Value Date   NA 142 09/03/2021   K 3.9 09/03/2021   CO2 27 09/03/2021   GLUCOSE 96 09/03/2021   BUN 17 09/03/2021   CREATININE 0.89 09/03/2021   BILITOT 0.8 07/30/2021   ALKPHOS 85 07/30/2021   AST 13 07/30/2021   ALT 13 07/30/2021   PROT 6.9 07/30/2021   ALBUMIN 4.1 07/30/2021   CALCIUM 9.9 09/03/2021   ANIONGAP 7 07/17/2021   EGFR 79 09/03/2021   Lab Results  Component Value Date   CHOL 141 07/30/2021   Lab Results  Component Value Date   HDL 71 07/30/2021   Lab Results  Component Value Date   LDLCALC 55 07/30/2021   Lab Results  Component Value Date   TRIG 76 07/30/2021   Lab Results  Component Value Date   CHOLHDL 2.0 07/30/2021   Lab Results  Component Value Date   HGBA1C 7.1 (H) 12/04/2021      Assessment & Plan:   Problem List Items Addressed This Visit       Cardiovascular and Mediastinum   Essential hypertension, benign   Relevant Medications   carvedilol (COREG) 12.5 MG tablet   Essential (primary) hypertension    Refilled Carvedilol 12.5 mg BID       Relevant Medications   carvedilol (COREG) 12.5 MG tablet   Migraine    Encouraged pt to f/u with her neurologist for an alternative to Rizatriptan       Relevant Medications   carvedilol (COREG) 12.5 MG tablet     Digestive   Nausea and vomiting    D/c Reglan and ordered Zofran 66m Encouraged pt to start taking Zofran for nausea        Endocrine   Uncontrolled type 2 diabetes mellitus with hyperglycemia (HCC)     Nervous and Auditory   Polyneuropathy    D/c gabapentin and encouraged patient to continue taking Cymbalta 677mBID         Other   Anxiety - Primary    Encouraged pt to f/u with her  psychiatrist for an alternative to clonazepam for her anxiety      Nausea   Relevant Medications   ondansetron (ZOFRAN) 4 MG tablet   Other Visit Diagnoses     Immunization due       Relevant Orders   Flu Vaccine QUAD 6+ mos PF IM (Fluarix Quad PF) (Completed)       Meds ordered this encounter  Medications   ondansetron (ZOFRAN) 4 MG tablet    Sig: Take 1 tablet (4 mg total) by mouth every 8 (eight) hours as needed for nausea or vomiting.    Dispense:  30 tablet    Refill:  3   DISCONTD: carvedilol (COREG) 12.5 MG tablet    Sig: Take 1 tablet (12.5 mg total) by mouth 2 (two) times daily.    Dispense:  180 tablet  Refill:  0   carvedilol (COREG) 12.5 MG tablet    Sig: Take 1 tablet (12.5 mg total) by mouth 2 (two) times daily.    Dispense:  180 tablet    Refill:  0    Follow-up: Return in about 3 months (around 04/29/2022).    Kathryn Monday, FNP

## 2022-01-30 ENCOUNTER — Ambulatory Visit: Payer: BC Managed Care – PPO | Admitting: "Endocrinology

## 2022-02-03 DIAGNOSIS — E1140 Type 2 diabetes mellitus with diabetic neuropathy, unspecified: Secondary | ICD-10-CM | POA: Diagnosis not present

## 2022-02-03 DIAGNOSIS — B351 Tinea unguium: Secondary | ICD-10-CM | POA: Diagnosis not present

## 2022-02-03 DIAGNOSIS — L851 Acquired keratosis [keratoderma] palmaris et plantaris: Secondary | ICD-10-CM | POA: Diagnosis not present

## 2022-02-15 ENCOUNTER — Other Ambulatory Visit: Payer: Self-pay | Admitting: Nurse Practitioner

## 2022-02-15 DIAGNOSIS — I10 Essential (primary) hypertension: Secondary | ICD-10-CM

## 2022-02-15 DIAGNOSIS — E1165 Type 2 diabetes mellitus with hyperglycemia: Secondary | ICD-10-CM

## 2022-02-15 DIAGNOSIS — E782 Mixed hyperlipidemia: Secondary | ICD-10-CM

## 2022-02-15 DIAGNOSIS — E039 Hypothyroidism, unspecified: Secondary | ICD-10-CM

## 2022-02-23 ENCOUNTER — Other Ambulatory Visit: Payer: Self-pay | Admitting: Nurse Practitioner

## 2022-02-23 DIAGNOSIS — I10 Essential (primary) hypertension: Secondary | ICD-10-CM

## 2022-02-24 ENCOUNTER — Ambulatory Visit: Payer: BC Managed Care – PPO | Admitting: "Endocrinology

## 2022-02-27 ENCOUNTER — Ambulatory Visit (INDEPENDENT_AMBULATORY_CARE_PROVIDER_SITE_OTHER): Payer: BC Managed Care – PPO | Admitting: Gastroenterology

## 2022-02-27 ENCOUNTER — Other Ambulatory Visit (INDEPENDENT_AMBULATORY_CARE_PROVIDER_SITE_OTHER): Payer: Self-pay

## 2022-03-03 ENCOUNTER — Ambulatory Visit: Payer: BC Managed Care – PPO | Admitting: Nurse Practitioner

## 2022-03-03 ENCOUNTER — Ambulatory Visit: Payer: BC Managed Care – PPO | Admitting: Psychiatry

## 2022-03-04 ENCOUNTER — Ambulatory Visit: Payer: BC Managed Care – PPO | Admitting: Nurse Practitioner

## 2022-03-05 ENCOUNTER — Ambulatory Visit (INDEPENDENT_AMBULATORY_CARE_PROVIDER_SITE_OTHER): Payer: BC Managed Care – PPO | Admitting: Family Medicine

## 2022-03-05 ENCOUNTER — Encounter: Payer: Self-pay | Admitting: Orthopedic Surgery

## 2022-03-05 ENCOUNTER — Encounter: Payer: Self-pay | Admitting: Family Medicine

## 2022-03-05 ENCOUNTER — Other Ambulatory Visit: Payer: Self-pay | Admitting: Family Medicine

## 2022-03-05 ENCOUNTER — Telehealth: Payer: Self-pay | Admitting: Family Medicine

## 2022-03-05 ENCOUNTER — Other Ambulatory Visit: Payer: Self-pay

## 2022-03-05 VITALS — BP 142/88 | HR 105 | Ht 60.0 in | Wt 163.1 lb

## 2022-03-05 DIAGNOSIS — Z0001 Encounter for general adult medical examination with abnormal findings: Secondary | ICD-10-CM

## 2022-03-05 DIAGNOSIS — E119 Type 2 diabetes mellitus without complications: Secondary | ICD-10-CM

## 2022-03-05 DIAGNOSIS — M65341 Trigger finger, right ring finger: Secondary | ICD-10-CM

## 2022-03-05 DIAGNOSIS — M65351 Trigger finger, right little finger: Secondary | ICD-10-CM

## 2022-03-05 DIAGNOSIS — E1165 Type 2 diabetes mellitus with hyperglycemia: Secondary | ICD-10-CM | POA: Diagnosis not present

## 2022-03-05 DIAGNOSIS — E559 Vitamin D deficiency, unspecified: Secondary | ICD-10-CM | POA: Diagnosis not present

## 2022-03-05 DIAGNOSIS — F339 Major depressive disorder, recurrent, unspecified: Secondary | ICD-10-CM

## 2022-03-05 DIAGNOSIS — I10 Essential (primary) hypertension: Secondary | ICD-10-CM

## 2022-03-05 DIAGNOSIS — G629 Polyneuropathy, unspecified: Secondary | ICD-10-CM

## 2022-03-05 DIAGNOSIS — E038 Other specified hypothyroidism: Secondary | ICD-10-CM | POA: Diagnosis not present

## 2022-03-05 DIAGNOSIS — M6530 Trigger finger, unspecified finger: Secondary | ICD-10-CM | POA: Insufficient documentation

## 2022-03-05 MED ORDER — GABAPENTIN 300 MG PO CAPS: 300.0000 mg | ORAL_CAPSULE | Freq: Every day | ORAL | 1 refills | Status: DC

## 2022-03-05 MED ORDER — DEXCOM G7 RECEIVER DEVI: 0 refills | Status: DC

## 2022-03-05 NOTE — Assessment & Plan Note (Signed)
Physical exam as documented Counseling is done on healthy lifestyle involving commitment to 150 minutes of exercise per week,  Discussed heart-healthy diet and attaining a healthy weight Changes in health habits are decided on by the patient with goals and time frames set for achieving them. Immunization and cancer screening needs are specifically addressed at this visit     

## 2022-03-05 NOTE — Telephone Encounter (Signed)
You can order the glucose meter kit and supplies

## 2022-03-05 NOTE — Patient Instructions (Addendum)
I appreciate the opportunity to provide care to you today!    Follow up:  2 weeks for BP  Fasting Labs: please stop by the lab today to get your blood drawn (CBC, CMP, TSH, Lipid profile, HgA1c, Vit D)  Please pick up your medications at the pharmacy   Referrals today- orthopedic surgery   Please continue to a heart-healthy diet and increase your physical activities. Try to exercise for 87mns at least three times a week.      It was a pleasure to see you and I look forward to continuing to work together on your health and well-being. Please do not hesitate to call the office if you need care or have questions about your care.   Have a wonderful day and week. With Gratitude, GAlvira MondayMSN, FNP-BC

## 2022-03-05 NOTE — Telephone Encounter (Signed)
Refill sent.

## 2022-03-05 NOTE — Progress Notes (Addendum)
Complete physical exam  Patient: Kathryn [name]   DOB: Dec 07, 1970   51 y.o. Female  MRN: [id number]  Subjective:    Chief Complaint  Patient presents with   Annual Exam    CPE today. Pt reports hands cramping and bending over on top of each other, has been happening since 09/27, has been using a small ball to try to improve this.     Kathryn [name] is a 51 y.o. female who presents today for a complete physical exam. She reports consuming a general diet.  Home exercise regimen includes walking daily for about 20 minutes .She generally feels poorly as she is grieving the loss of her granddaughter who birthday was 03/04/2022.  The patient does follow up with her psychiatrist, Dr. Levonne Spiller. She reports sleeping poorly as she is currently grieving the loss of her granddaughter. She does have additional problems to discuss today.    Trigger Finger: Onset of symptoms 6 months ago.  She reports locking of her little finger and her ring finger in flexion requiring passive manipulation manipulation of the fingers to achieve normal motion.  She reports that the symptoms usually occur spontaneously and each episode is painful.  She reports that she has been implementing conservative measurements with tylenol and splinting with minimal relief of her symptoms.     Most recent fall risk assessment:    03/05/2022    9:04 AM  Fall Risk   Falls in the past year? 0  Number falls in past yr: 0  Injury with Fall? 0  Risk for fall due to : No Fall Risks  Follow up Falls evaluation completed     Most recent depression screenings:    03/05/2022    9:17 AM 03/05/2022    9:04 AM  PHQ 2/9 Scores  PHQ - 2 Score 6 3  PHQ- 9 Score 26 8    Dental: No current dental problems and Last dental visit: 12/2021  Patient Active Problem List   Diagnosis Date Noted   Trigger finger 03/05/2022   Encounter for general adult medical examination with abnormal findings 03/05/2022   Nausea 01/08/2022    Wheezing 01/08/2022   Migraine 10/31/2021   Depression, recurrent ([place]) 10/31/2021   Great toe pain, right 10/03/2021   Nausea and vomiting 09/10/2021   Chest pain 08/29/2021   Chronic tension-type headache, not intractable 08/29/2021   Encounter for examination following treatment at hospital 07/30/2021   Uncontrolled type 2 diabetes mellitus with hyperglycemia ([place]) 07/30/2021   Polyneuropathy 07/17/2021   Anxiety 07/01/2021   Diabetic autonomic neuropathy associated with type 2 diabetes mellitus ([place]) 07/01/2021   Need for immunization against influenza 07/01/2021   Need for varicella vaccine 07/01/2021   Melena 04/23/2021   Constipation 04/23/2021   Pain of upper abdomen 04/23/2021   Peripheral polyneuropathy 03/28/2021   Body mass index (BMI) 28.0-28.9, adult 03/04/2021   Body mass index (BMI) 26.0-26.9, adult 01/22/2021   Lumbar radiculopathy 12/13/2020   Essential (primary) hypertension 10/01/2020   Body mass index (BMI) 30.0-30.9, adult 10/01/2020   Moderate persistent asthma 09/28/2020   Body mass index (BMI) 29.0-29.9, adult 08/27/2020   Disc displacement, lumbar 08/27/2020   Elevated blood-pressure reading, without diagnosis of hypertension 08/27/2020   Non-adherence to medical treatment 08/02/2020   Functional dyspepsia 06/07/2020   Vitamin D deficiency 07/15/2019   Uncontrolled diabetes mellitus with hyperglycemia, with long-term current use of insulin ([place]) 04/27/2019   Hypothyroidism 04/07/2019   Obesity (BMI 30.0-34.9) 04/07/2019  Mixed hyperlipidemia 04/07/2019   Abnormal esophagram 11/23/2018   Chronic GERD 10/08/2015   IBS (irritable bowel syndrome) 10/08/2015   Type 2 diabetes mellitus without complication, without long-term current use of insulin ([place]) 08/22/2015   Essential hypertension, benign 08/22/2015   Schizoaffective disorder ([place]) 02/08/2015   PTSD (post-traumatic stress disorder) 02/08/2015   Past Medical History:  Diagnosis Date   Anemia     Anxiety    Asthma    Depression    GERD (gastroesophageal reflux disease)    HLD (hyperlipidemia) 04/07/2019   Hypertension    Hypothyroidism, adult 04/07/2019   Neuropathy    Obesity (BMI 30.0-34.9) 04/07/2019   Schizoaffective disorder ([place])    Type II diabetes mellitus, uncontrolled 04/27/2019   Past Surgical History:  Procedure Laterality Date   BACK SURGERY  09/06/2020   BIOPSY  05/03/2021   Procedure: BIOPSY;  Surgeon: [name], MD;  [facility];  Service: Gastroenterology;;   BREAST SURGERY Right    biopsy   CESAREAN SECTION     CHOLECYSTECTOMY  06/02/2012   Procedure: LAPAROSCOPIC CHOLECYSTECTOMY;  Surgeon: Jamesetta So, MD;  Location: [facility];  Service: General;  Laterality: N/A;  Attempted laparoscopic cholecystectomy   CHOLECYSTECTOMY  06/02/2012   Procedure: CHOLECYSTECTOMY;  Surgeon: Jamesetta So, MD;  Location: [facility];  Service: General;  Laterality: N/A;  converted to open at  9417   COLONOSCOPY WITH PROPOFOL N/A 07/18/2020   prep was fair, one 45m polyp (inflammatory) stool in descending colon, transverse and ascending, distal rectum and anal verge normal   ESOPHAGEAL DILATION  12/31/2018   Procedure: ESOPHAGEAL DILATION;  Surgeon: RRogene Houston MD;  [facility];  Service: Endoscopy;;   ESOPHAGOGASTRODUODENOSCOPY (EGD) WITH PROPOFOL N/A 08/24/2015   Procedure: ESOPHAGOGASTRODUODENOSCOPY (EGD) WITH PROPOFOL;  Surgeon: NRogene Houston MD;  [facility];  Service: Endoscopy;  Laterality: N/A;  1:10 - Ann to notify pt to arrive at 11:30   ESOPHAGOGASTRODUODENOSCOPY (EGD) WITH PROPOFOL N/A 12/31/2018   normal, no abnormality to explain dysphagia, esophagus dilated.   ESOPHAGOGASTRODUODENOSCOPY (EGD) WITH PROPOFOL N/A 05/03/2021   Procedure: ESOPHAGOGASTRODUODENOSCOPY (EGD) WITH PROPOFOL;  Surgeon: CHarvel Quale MD;  [facility];  Service: Gastroenterology;  Laterality: N/A;  1:20   FLEXIBLE  SIGMOIDOSCOPY  07/17/2020   Procedure: FLEXIBLE SIGMOIDOSCOPY;  Surgeon: CMontez Morita DQuillian Quince MD;  [facility];  Service: Gastroenterology;;   POLYPECTOMY  07/18/2020   Procedure: POLYPECTOMY INTESTINAL;  Surgeon: CHarvel Quale MD;  [facility];  Service: Gastroenterology;;  ascending colon polyp;    SAVORY DILATION  05/03/2021   Procedure: SAVORY DILATION;  Surgeon: CHarvel Quale MD;  [facility];  Service: Gastroenterology;;   tooth removal  2019   all teeth removed   TUBAL LIGATION     X3   Social History   Tobacco Use   Smoking status: Never   Smokeless tobacco: Never  Vaping Use   Vaping Use: Never used  Substance Use Topics   Alcohol use: No    Alcohol/week: 0.0 standard drinks of alcohol   Drug use: No   Social History   Socioeconomic History   Marital status: Married    Spouse name: TPieter Partridge  Number of children: 3   Years of education: 12   Highest education level: Not on file  Occupational History    Comment: [employer]    Comment: 3rd shift  Tobacco Use   Smoking status: Never  Smokeless tobacco: Never  Vaping Use   Vaping Use: Never used  Substance and Sexual Activity   Alcohol use: No    Alcohol/week: 0.0 standard drinks of alcohol   Drug use: No   Sexual activity: Not Currently    Birth control/protection: Surgical  Other Topics Concern   Not on file  Social History Narrative   Married for 22 years.On disability secondary to schizophrenia.   Lives with husband.   Caffeine- decaf coffee, tea   Social Determinants of Health   Financial Resource Strain: Not on file  Food Insecurity: Not on file  Transportation Needs: Not on file  Physical Activity: Not on file  Stress: Not on file  Social Connections: Not on file  Intimate Partner Violence: Not on file   Family Status  Relation Name Status   Mother  Deceased   Father  Deceased   Sister  Alive   Sister  Alive   Sister  Alive    Sister  Alive   Sister  Alive   Brother  [place]   Brother  Alive   MGM  (Not Specified)   MGF  (Not Specified)   Daughter  Alive   Daughter  Alive   Daughter  Alive   Mat Aunt  (Not Specified)   Field seismologist  (Not Specified)   Cousin  (Not Specified)   Other married Alive   Other  (Not Specified)   Family History  Problem Relation Age of Onset   Hypertension Mother    Alcohol abuse Mother    Heart disease Mother    Kidney disease Mother    Aneurysm Mother        brain   Hypertension Father    Alcohol abuse Sister    Alcohol abuse Maternal Grandmother    Alcohol abuse Maternal Grandfather    Hearing loss Daughter    Alcohol abuse Maternal Aunt    Alcohol abuse Paternal Aunt    Alcohol abuse Cousin    Stroke Other    Diabetes Other    Cancer Other    Seizures Other    No Known Allergies    Patient Care Team: Alvira Monday, FNP as PCP - General (Family Medicine) Harl Bowie, Alphonse Guild, MD as PCP - Cardiology (Cardiology) Cloria Spring, MD as Consulting Physician [facility])   Outpatient Medications Prior to Visit  Medication Sig   albuterol (VENTOLIN HFA) 108 (90 Base) MCG/ACT inhaler Inhale 2 puffs into the lungs every 6 (six) hours as needed for wheezing or shortness of breath.   amLODipine (NORVASC) 5 MG tablet Take 1 tablet (5 mg total) by mouth daily.   aspirin EC 81 MG tablet Take 81 mg by mouth daily.   atorvastatin (LIPITOR) 10 MG tablet TAKE 1 TABLET BY MOUTH EVERYDAY AT BEDTIME   benztropine (COGENTIN) 1 MG tablet TAKE 1 TABLET(1 MG) BY MOUTH AT BEDTIME   Blood Glucose Monitoring Suppl (ACCU-CHEK GUIDE ME) w/Device KIT 1 Piece by Does not apply route as directed.   budesonide-formoterol (SYMBICORT) 80-4.5 MCG/ACT inhaler INHALE 2 PUFFS INTO THE LUNGS TWICE A DAY   carvedilol (COREG) 12.5 MG tablet Take 1 tablet (12.5 mg total) by mouth 2 (two) times daily.   Cholecalciferol (VITAMIN D-3) 125 MCG (5000 UT) TABS Take 2 tablets by mouth daily.    clonazePAM (KLONOPIN) 0.5 MG tablet TAKE 1 TABLET BY MOUTH 2 TIMES DAILY AS NEEDED FOR ANXIETY.   Continuous Blood Gluc Sensor (DEXCOM G7 SENSOR) MISC Used to monitor blood  glucose DX E11.65   Dulaglutide (TRULICITY) 3 PJ/8.2NK SOPN Inject 3 mg as directed once a week.   DULoxetine (CYMBALTA) 60 MG capsule Take 1 capsule (60 mg total) by mouth 2 (two) times daily.   glipiZIDE (GLUCOTROL XL) 5 MG 24 hr tablet TAKE 2 TABLETS (10MG) BY MOUTH DAILY   glucose blood (ACCU-CHEK GUIDE) test strip Use as instructed   levothyroxine (SYNTHROID) 75 MCG tablet Take 75 mcg by mouth daily before breakfast.   losartan (COZAAR) 100 MG tablet TAKE 1 TABLET BY MOUTH EVERY DAY   lubiprostone (AMITIZA) 24 MCG capsule Take 1 capsule (24 mcg total) by mouth 2 (two) times daily with a meal.   Multiple Vitamin (MULTIVITAMIN) tablet Take 1 tablet by mouth daily.   omeprazole (PRILOSEC) 40 MG capsule TAKE 1 CAPSULE BY MOUTH TWICE A DAY   ondansetron (ZOFRAN) 4 MG tablet Take 1 tablet (4 mg total) by mouth every 8 (eight) hours as needed for nausea or vomiting.   pantoprazole (PROTONIX) 40 MG tablet Take 40 mg by mouth daily.   Polyvinyl Alcohol-Povidone PF (REFRESH) 1.4-0.6 % SOLN Place 1 drop into both eyes daily as needed (dry eyes).   risperiDONE (RISPERDAL) 2 MG tablet TAKE 1 TABLET EVERY MORNING AND 3 TABLETS AT BEDTIME   rizatriptan (MAXALT) 10 MG tablet TAKE 1 TABLET BY MOUTH AS NEEDED FOR MIGRAINE. MAY REPEAT IN 2 HOURS IF NEEDED   sucralfate (CARAFATE) 1 GM/10ML suspension Take 10 mLs (1 g total) by mouth 4 (four) times daily -  with meals and at bedtime.   SUMAtriptan (IMITREX) 50 MG tablet TAKE 1 TABLET BY MOUTH EVERY 2 HOURS AS NEEDED FOR MIGRAINE. MAY REPEAT IN 2 HOURS IF HEADACHE PERSISTS OR RECURS.   topiramate (TOPAMAX) 25 MG tablet TAKE 25 MG (1 PILL) AT BEDTIME FOR ONE WEEK, THEN INCREASE TO 50 MG (2 PILLS) AT BEDTIME FOR ONE WEEK, THEN TAKE 75 MG (3 PILLS) AT BEDTIME FOR ONE WEEK, THEN TAKE 100 MG (4  PILLS) AT BEDTIME   traZODone (DESYREL) 100 MG tablet TAKE 2 TABLETS(200 MG) BY MOUTH AT BEDTIME   [DISCONTINUED] Continuous Blood Gluc Receiver (DEXCOM G7 RECEIVER) DEVI Used to monitor blood glucose DX: E11.65   [DISCONTINUED] gabapentin (NEURONTIN) 300 MG capsule Take 300 mg by mouth daily.   insulin glargine, 2 Unit Dial, (TOUJEO MAX SOLOSTAR) 300 UNIT/ML Solostar Pen Inject 80 Units into the skin at bedtime.   No facility-administered medications prior to visit.    Review of Systems  Constitutional:  Negative for chills and fever.  HENT:  Negative for sore throat.   Eyes:  Negative for discharge and redness.  Respiratory:  Negative for sputum production and shortness of breath.   Cardiovascular:  Negative for chest pain and orthopnea.  Gastrointestinal:  Negative for nausea and vomiting.  Genitourinary:  Negative for flank pain and hematuria.  Musculoskeletal:  Positive for joint pain.  Skin:  Negative for rash.  Neurological:  Negative for dizziness and headaches.  Endo/Heme/Allergies:  Negative for polydipsia.  Psychiatric/Behavioral:  Negative for memory loss. The patient does not have insomnia.           Objective:     BP (!) 142/88 (BP Location: Left Arm)   Pulse (!) 105   Ht 5' (1.524 m)   Wt 163 lb 1.3 oz (74 kg)   LMP 01/23/2013   SpO2 98%   BMI 31.85 kg/m  BP Readings from Last 3 Encounters:  03/05/22 (!) 142/88  01/28/22 138/88  01/08/22 138/80   Wt Readings from Last 3 Encounters:  03/05/22 163 lb 1.3 oz (74 kg)  01/28/22 161 lb 1.3 oz (73.1 kg)  01/08/22 162 lb (73.5 kg)      Physical Exam HENT:     Head: Normocephalic.     Right Ear: External ear normal.     Left Ear: External ear normal.     Nose: No congestion.     Mouth/Throat:     Mouth: Mucous membranes are moist.  Eyes:     Extraocular Movements: Extraocular movements intact.     Pupils: Pupils are equal, round, and reactive to light.  Cardiovascular:     Rate and Rhythm: Normal  rate and regular rhythm.     Pulses: Normal pulses.     Heart sounds: Normal heart sounds.  Pulmonary:     Effort: Pulmonary effort is normal.     Breath sounds: Normal breath sounds.  Abdominal:     Palpations: Abdomen is soft.     Tenderness: There is no right CVA tenderness or left CVA tenderness.  Musculoskeletal:     Right hand: No swelling or deformity.     Cervical back: No rigidity.     Comments: There was no visible locking of the little finger of the pinky finger on physical examination when asked to open and close the hand fully.  Skin:    Findings: No lesion or rash.  Neurological:     Mental Status: She is alert.  Psychiatric:     Comments: Normal affect      No results found for any visits on 03/05/22. Last CBC Lab Results  Component Value Date   WBC 6.9 07/17/2021   HGB 12.9 07/17/2021   HCT 37.1 07/17/2021   MCV 83.7 07/17/2021   MCH 29.1 07/17/2021   RDW 11.8 07/17/2021   PLT 275 69/62/9528   Last metabolic panel Lab Results  Component Value Date   GLUCOSE 96 09/03/2021   NA 142 09/03/2021   K 3.9 09/03/2021   CL 101 09/03/2021   CO2 27 09/03/2021   BUN 17 09/03/2021   CREATININE 0.89 09/03/2021   EGFR 79 09/03/2021   CALCIUM 9.9 09/03/2021   PHOS 3.4 06/03/2012   PROT 6.9 07/30/2021   ALBUMIN 4.1 07/30/2021   LABGLOB 2.8 07/30/2021   AGRATIO 1.5 07/30/2021   BILITOT 0.8 07/30/2021   ALKPHOS 85 07/30/2021   AST 13 07/30/2021   ALT 13 07/30/2021   ANIONGAP 7 07/17/2021   Last lipids Lab Results  Component Value Date   CHOL 141 07/30/2021   HDL 71 07/30/2021   LDLCALC 55 07/30/2021   TRIG 76 07/30/2021   CHOLHDL 2.0 07/30/2021   Last hemoglobin A1c Lab Results  Component Value Date   HGBA1C 7.1 (H) 12/04/2021   Last thyroid functions Lab Results  Component Value Date   TSH 1.360 09/03/2021   Last vitamin D Lab Results  Component Value Date   VD25OH 32.5 07/30/2021   Last vitamin B12 and Folate No results found for:  "VITAMINB12", "FOLATE"      Assessment & Plan:    Routine Health Maintenance and Physical Exam  Immunization History  Administered Date(s) Administered   Influenza Split 06/03/2012   Influenza,inj,Quad PF,6+ Mos 04/07/2019, 03/15/2020, 07/01/2021, 01/28/2022   Moderna Sars-Covid-2 Vaccination 09/02/2019, 09/30/2019   Pneumococcal Polysaccharide-23 06/03/2012   Tdap 05/24/2014   Zoster Recombinat (Shingrix) 07/01/2021, 10/31/2021    Health Maintenance  Topic Date Due   COVID-19 Vaccine (3 -  Moderna series) 11/25/2019   MAMMOGRAM  08/06/2021   HEMOGLOBIN A1C  06/06/2022   Diabetic kidney evaluation - GFR measurement  09/04/2022   OPHTHALMOLOGY EXAM  09/21/2022   FOOT EXAM  10/04/2022   Diabetic kidney evaluation - Urine ACR  12/05/2022   COLONOSCOPY (Pts 45-89yr Insurance coverage will need to be confirmed)  07/19/2023   PAP SMEAR-Modifier  05/13/2024   TETANUS/TDAP  05/24/2024   INFLUENZA VACCINE  Completed   Hepatitis C Screening  Completed   HIV Screening  Completed   Zoster Vaccines- Shingrix  Completed   HPV VACCINES  Aged Out    Discussed health benefits of physical activity, and encouraged her to engage in regular exercise appropriate for her age and condition.  Problem List Items Addressed This Visit       Cardiovascular and Mediastinum   Essential hypertension, benign    She reports taking her medication today She takes amlodipine 5 mg daily, losartan 100 mg daily,and carvedilol 12.5 mg daily She reports that her blood pressure is elevated today due to increased life stresses We will have the patient follow-up in 2 weeks and reassess her blood pressure Encouraged to continue treatment regimen and to take her blood pressure medication before her next appointment        Endocrine   Type 2 diabetes mellitus without complication, without long-term current use of insulin (HCC)    Lab Results  Component Value Date   HGBA1C 7.1 (H) 12/04/2021  Will assess  hemoglobin A1c today She denies polyuria, polydipsia, and polyphagia      Hypothyroidism    Will assess labs today Continue taking Synthroid 75 mcg daily before breakfast on empty stomach      Relevant Orders   TSH + free T4   Uncontrolled type 2 diabetes mellitus with hyperglycemia (HCC)   Relevant Orders   CBC with Differential/Platelet   CMP14+EGFR   Hemoglobin A1C   Lipid Profile     Nervous and Auditory   Polyneuropathy   Relevant Medications   gabapentin (NEURONTIN) 300 MG capsule     Musculoskeletal and Integument   Trigger finger    Encouraged to continue taking Tylenol as needed for pain Referral placed to orthopedic surgery for possible corticosteroid injection      Relevant Orders   Ambulatory referral to Orthopedic Surgery     Other   Vitamin D deficiency   Relevant Orders   Vitamin D (25 hydroxy)   Depression, recurrent ([place]    She denies suicidal ideation and homicidal ideation She denies self injury She is currently grieving the loss of her granddaughter, whose birthday was 03/04/20[birth year] She will be following up with her psychiatrist in the next 2 weeks    03/05/2022    9:17 AM 03/05/2022    9:04 AM 01/28/2022   10:04 AM 01/08/2022    8:25 AM 01/03/2022    1:32 PM  Depression screen PHQ 2/9  Decreased Interest 3 0 0 1 3  Down, Depressed, Hopeless 3 3 0 1 3  PHQ - 2 Score 6 3 0 2 6  Altered sleeping 3 0  3 3  Tired, decreased energy '3 1  3 3  ' Change in appetite '3 2  3 3  ' Feeling bad or failure about yourself  '3 1  3 3  ' Trouble concentrating 3 0  3 3  Moving slowly or fidgety/restless 3 1  0 0  Suicidal thoughts 2 0  0 0  PHQ-9 Score 26 8  17 21  Difficult doing work/chores Very difficult   Somewhat difficult Extremely dIfficult        Encounter for general adult medical examination with abnormal findings - Primary    Physical exam as documented Counseling is done on healthy lifestyle involving commitment to 150 minutes of exercise per week,   Discussed heart-healthy diet and attaining a healthy weight Changes in health habits are decided on by the patient with goals and time frames set for achieving them. Immunization and cancer screening needs are specifically addressed at this visit          Return in about 2 weeks (around 03/19/2022) for Bp.     Alvira Monday, FNP

## 2022-03-05 NOTE — Assessment & Plan Note (Signed)
Lab Results  Component Value Date   HGBA1C 7.1 (H) 12/04/2021  Will assess hemoglobin A1c today She denies polyuria, polydipsia, and polyphagia

## 2022-03-05 NOTE — Assessment & Plan Note (Signed)
Will assess labs today Continue taking Synthroid 75 mcg daily before breakfast on empty stomach

## 2022-03-05 NOTE — Assessment & Plan Note (Addendum)
She denies suicidal ideation and homicidal ideation She denies self injury She is currently grieving the loss of her granddaughter, whose birthday was 03/04/20[birth year] She will be following up with her psychiatrist in the next 2 weeks    03/05/2022    9:17 AM 03/05/2022    9:04 AM 01/28/2022   10:04 AM 01/08/2022    8:25 AM 01/03/2022    1:32 PM  Depression screen PHQ 2/9  Decreased Interest 3 0 0 1 3  Down, Depressed, Hopeless 3 3 0 1 3  PHQ - 2 Score 6 3 0 2 6  Altered sleeping 3 0  3 3  Tired, decreased energy '3 1  3 3  '$ Change in appetite '3 2  3 3  '$ Feeling bad or failure about yourself  '3 1  3 3  '$ Trouble concentrating 3 0  3 3  Moving slowly or fidgety/restless 3 1  0 0  Suicidal thoughts 2 0  0 0  PHQ-9 Score '26 8  17 21  '$ Difficult doing work/chores Very difficult   Somewhat difficult Extremely dIfficult

## 2022-03-05 NOTE — Assessment & Plan Note (Signed)
Encouraged to continue taking Tylenol as needed for pain Referral placed to orthopedic surgery for possible corticosteroid injection

## 2022-03-05 NOTE — Assessment & Plan Note (Signed)
She reports taking her medication today She takes amlodipine 5 mg daily, losartan 100 mg daily,and carvedilol 12.5 mg daily She reports that her blood pressure is elevated today due to increased life stresses We will have the patient follow-up in 2 weeks and reassess her blood pressure Encouraged to continue treatment regimen and to take her blood pressure medication before her next appointment

## 2022-03-05 NOTE — Telephone Encounter (Signed)
Patient need refill on   Continuous Blood Gluc Sensor ([place]) MISC

## 2022-03-06 ENCOUNTER — Ambulatory Visit: Payer: BC Managed Care – PPO | Admitting: Psychiatry

## 2022-03-06 LAB — LIPID PANEL
Chol/HDL Ratio: 2.2 ratio (ref 0.0–4.4)
Cholesterol, Total: 130 mg/dL (ref 100–199)
HDL: 60 mg/dL (ref >39)
LDL Chol Calc (NIH): 39 mg/dL (ref 0–99)
Triglycerides: 199 mg/dL — ABNORMAL HIGH (ref 0–149)
VLDL Cholesterol Cal: 31 mg/dL (ref 5–40)

## 2022-03-06 LAB — CBC WITH DIFFERENTIAL/PLATELET
Basophils Absolute: 0 10*3/uL (ref 0.0–0.2)
Basos: 1 %
EOS (ABSOLUTE): 0.2 10*3/uL (ref 0.0–0.4)
Eos: 3 %
Hematocrit: 33.9 % — ABNORMAL LOW (ref 34.0–46.6)
Hemoglobin: 11.4 g/dL (ref 11.1–15.9)
Immature Grans (Abs): 0 10*3/uL (ref 0.0–0.1)
Immature Granulocytes: 0 %
Lymphocytes Absolute: 2.9 10*3/uL (ref 0.7–3.1)
Lymphs: 47 %
MCH: 27.9 pg (ref 26.6–33.0)
MCHC: 33.6 g/dL (ref 31.5–35.7)
MCV: 83 fL (ref 79–97)
Monocytes Absolute: 0.4 10*3/uL (ref 0.1–0.9)
Monocytes: 6 %
Neutrophils Absolute: 2.7 10*3/uL (ref 1.4–7.0)
Neutrophils: 43 %
Platelets: 270 10*3/uL (ref 150–450)
RBC: 4.09 x10E6/uL (ref 3.77–5.28)
RDW: 11.5 % — ABNORMAL LOW (ref 11.7–15.4)
WBC: 6.1 10*3/uL (ref 3.4–10.8)

## 2022-03-06 LAB — CMP14+EGFR
ALT: 19 IU/L (ref 0–32)
AST: 13 IU/L (ref 0–40)
Albumin/Globulin Ratio: 1.7 (ref 1.2–2.2)
Albumin: 4.5 g/dL (ref 3.8–4.9)
Alkaline Phosphatase: 118 IU/L (ref 44–121)
BUN/Creatinine Ratio: 14 (ref 9–23)
BUN: 13 mg/dL (ref 6–24)
Bilirubin Total: 0.5 mg/dL (ref 0.0–1.2)
CO2: 24 mmol/L (ref 20–29)
Calcium: 9.5 mg/dL (ref 8.7–10.2)
Chloride: 98 mmol/L (ref 96–106)
Creatinine, Ser: 0.92 mg/dL (ref 0.57–1.00)
Globulin, Total: 2.7 g/dL (ref 1.5–4.5)
Glucose: 331 mg/dL — ABNORMAL HIGH (ref 70–99)
Potassium: 3.8 mmol/L (ref 3.5–5.2)
Sodium: 136 mmol/L (ref 134–144)
Total Protein: 7.2 g/dL (ref 6.0–8.5)
eGFR: 75 mL/min/{1.73_m2} (ref >59)

## 2022-03-06 LAB — HEMOGLOBIN A1C
Est. average glucose Bld gHb Est-mCnc: 275 mg/dL
Hgb A1c MFr Bld: 11.2 % — ABNORMAL HIGH (ref 4.8–5.6)

## 2022-03-06 LAB — VITAMIN D 25 HYDROXY (VIT D DEFICIENCY, FRACTURES): Vit D, 25-Hydroxy: 31.4 ng/mL (ref 30.0–100.0)

## 2022-03-06 LAB — TSH+FREE T4
Free T4: 1.31 ng/dL (ref 0.82–1.77)
TSH: 1 u[IU]/mL (ref 0.450–4.500)

## 2022-03-06 NOTE — Progress Notes (Deleted)
   CC:  headaches  Follow-up Visit  Last visit: 10/08/21  Brief HPI: 51 year old female with a history of HTN, DM2, HLD, asthma who follows in clinic for migraines.  At her last visit MRI brain and CTA head were ordered. Topamax was started for migraine prevention and Maxalt was started for rescue.  Interval History: Since her last visit, headaches***Topamax***Maxalt***  Brain MRI and CTA head were unremarkable.  Headache days per month: *** Migraine days per month*** Headache free days per month: ***  Current Headache Regimen: Preventative: *** Abortive: ***   Prior Therapies                                  Cymbalta 60 mg BID Lisinopril 40 mg daily Losartan 100 mg daily Carvedilol 12.5 mg BID Amitriptyline 25 mg QHS Gabapentin 300 mg daily Imitrex 50 mg PRN - lack of efficacy, nausea  Physical Exam:   Vital Signs: LMP 01/23/2013  GENERAL:  well appearing, in no acute distress, alert  SKIN:  Color, texture, turgor normal. No rashes or lesions HEAD:  Normocephalic/atraumatic. RESP: normal respiratory effort MSK:  No gross joint deformities.   NEUROLOGICAL: Mental Status: Alert, oriented to person, place and time, Follows commands, and Speech fluent and appropriate. Cranial Nerves: PERRL, face symmetric, no dysarthria, hearing grossly intact Motor: moves all extremities equally Gait: normal-based.  IMPRESSION: ***  PLAN: ***   Follow-up: ***  I spent a total of *** minutes on the date of the service. Headache education was done. Discussed lifestyle modification including increased oral hydration, decreased caffeine, exercise and stress management. Discussed treatment options including preventive and acute medications, natural supplements, and infusion therapy. Discussed medication overuse headache and to limit use of acute treatments to no more than 2 days/week or 10 days/month. Discussed medication side effects, adverse reactions and drug interactions.  Written educational materials and patient instructions outlining all of the above were given.  Genia Harold, MD

## 2022-03-07 ENCOUNTER — Other Ambulatory Visit: Payer: Self-pay

## 2022-03-07 DIAGNOSIS — E1165 Type 2 diabetes mellitus with hyperglycemia: Secondary | ICD-10-CM

## 2022-03-07 MED ORDER — BLOOD GLUCOSE METER KIT: PACK | 0 refills | Status: DC

## 2022-03-07 NOTE — Progress Notes (Signed)
gl

## 2022-03-08 ENCOUNTER — Other Ambulatory Visit: Payer: Self-pay | Admitting: Family Medicine

## 2022-03-08 ENCOUNTER — Other Ambulatory Visit (INDEPENDENT_AMBULATORY_CARE_PROVIDER_SITE_OTHER): Payer: Self-pay | Admitting: Gastroenterology

## 2022-03-08 ENCOUNTER — Other Ambulatory Visit: Payer: Self-pay | Admitting: Psychiatry

## 2022-03-08 DIAGNOSIS — I10 Essential (primary) hypertension: Secondary | ICD-10-CM

## 2022-03-10 ENCOUNTER — Telehealth: Payer: Self-pay | Admitting: Family Medicine

## 2022-03-10 ENCOUNTER — Other Ambulatory Visit: Payer: Self-pay | Admitting: Family Medicine

## 2022-03-10 DIAGNOSIS — E1165 Type 2 diabetes mellitus with hyperglycemia: Secondary | ICD-10-CM

## 2022-03-10 MED ORDER — GLIPIZIDE 10 MG PO TABS: 10.0000 mg | ORAL_TABLET | Freq: Two times a day (BID) | ORAL | 3 refills | Status: DC

## 2022-03-10 NOTE — Telephone Encounter (Signed)
Pt returning call

## 2022-03-10 NOTE — Progress Notes (Signed)
Please inform the patient to follow-up with her endocrinologist for dose adjustment on her insulin and her Trulicity.  Her hemoglobin A1c is 11.2.  I have increased her glipizide to 10 mg twice daily. the medication is sent to her pharmacy to start taking.  Her triglycerides also elevated.  I recommend that she starts taking over-the-counter fish oil 2000 mg twice daily.  Please encourage the patient to adhere to medication regiment, to increase her physical activities, and start implementing a heart healthy diet.

## 2022-03-10 NOTE — Telephone Encounter (Signed)
Labs reviewed.

## 2022-03-19 ENCOUNTER — Encounter: Payer: Self-pay | Admitting: Family Medicine

## 2022-03-19 ENCOUNTER — Ambulatory Visit (INDEPENDENT_AMBULATORY_CARE_PROVIDER_SITE_OTHER): Payer: BC Managed Care – PPO | Admitting: Family Medicine

## 2022-03-19 DIAGNOSIS — I10 Essential (primary) hypertension: Secondary | ICD-10-CM | POA: Diagnosis not present

## 2022-03-19 NOTE — Assessment & Plan Note (Signed)
Controlled No changes to treatment regimen today Encouraged to continue taking amlodipine 5 mg daily, losartan 100 mg daily, and carvedilol 12.5 mg daily Encourage low-sodium diet, consuming less than 1500 mg of sodium daily Encouraged to continue increase physical activities BP Readings from Last 3 Encounters:  03/19/22 132/88  03/05/22 (!) 142/88  01/28/22 138/88

## 2022-03-19 NOTE — Progress Notes (Signed)
Established Patient Office Visit  Subjective:  Patient ID: Kathryn [name], female    DOB: Mar 09, 1971  Age: 51 y.o. MRN: [id number]  CC:  Chief Complaint  Patient presents with   Follow-up    Bp follow up , taking medication and checking bp at home.     HPI Kathryn [name] is a 51 y.o. female with past medical history of hypertension presents for f/u of  chronic medical conditions.  Hypertension: She takes amlodipine 5 mg daily, losartan 100 mg daily, and carvedilol 12.5 mg daily.  She reports decreased rest today and adherence with treatment regimen.  She denies headaches, dizziness, and blurry vision.  Past Medical History:  Diagnosis Date   Anemia    Anxiety    Asthma    Depression    GERD (gastroesophageal reflux disease)    HLD (hyperlipidemia) 04/07/2019   Hypertension    Hypothyroidism, adult 04/07/2019   Neuropathy    Obesity (BMI 30.0-34.9) 04/07/2019   Schizoaffective disorder ([place])    Type II diabetes mellitus, uncontrolled 04/27/2019    Past Surgical History:  Procedure Laterality Date   BACK SURGERY  09/06/2020   BIOPSY  05/03/2021   Procedure: BIOPSY;  Surgeon: [name], MD;  [facility];  Service: Gastroenterology;;   BREAST SURGERY Right    biopsy   CESAREAN SECTION     CHOLECYSTECTOMY  06/02/2012   Procedure: LAPAROSCOPIC CHOLECYSTECTOMY;  Surgeon: Jamesetta So, MD;  Location: [facility];  Service: General;  Laterality: N/A;  Attempted laparoscopic cholecystectomy   CHOLECYSTECTOMY  06/02/2012   Procedure: CHOLECYSTECTOMY;  Surgeon: Jamesetta So, MD;  Location: [facility];  Service: General;  Laterality: N/A;  converted to open at  7939   COLONOSCOPY WITH PROPOFOL N/A 07/18/2020   prep was fair, one 65m polyp (inflammatory) stool in descending colon, transverse and ascending, distal rectum and anal verge normal   ESOPHAGEAL DILATION  12/31/2018   Procedure: ESOPHAGEAL DILATION;  Surgeon: RRogene Houston MD;  [facility];  Service: Endoscopy;;   ESOPHAGOGASTRODUODENOSCOPY (EGD) WITH PROPOFOL N/A 08/24/2015   Procedure: ESOPHAGOGASTRODUODENOSCOPY (EGD) WITH PROPOFOL;  Surgeon: NRogene Houston MD;  [facility];  Service: Endoscopy;  Laterality: N/A;  1:10 - Ann to notify pt to arrive at 11:30   ESOPHAGOGASTRODUODENOSCOPY (EGD) WITH PROPOFOL N/A 12/31/2018   normal, no abnormality to explain dysphagia, esophagus dilated.   ESOPHAGOGASTRODUODENOSCOPY (EGD) WITH PROPOFOL N/A 05/03/2021   Procedure: ESOPHAGOGASTRODUODENOSCOPY (EGD) WITH PROPOFOL;  Surgeon: CHarvel Quale MD;  [facility];  Service: Gastroenterology;  Laterality: N/A;  1:20   FLEXIBLE SIGMOIDOSCOPY  07/17/2020   Procedure: FLEXIBLE SIGMOIDOSCOPY;  Surgeon: CHarvel Quale MD;  [facility];  Service: Gastroenterology;;   POLYPECTOMY  07/18/2020   Procedure: POLYPECTOMY INTESTINAL;  Surgeon: CHarvel Quale MD;  [facility];  Service: Gastroenterology;;  ascending colon polyp;    SAVORY DILATION  05/03/2021   Procedure: SAVORY DILATION;  Surgeon: CHarvel Quale MD;  [facility];  Service: Gastroenterology;;   tooth removal  2019   all teeth removed   TUBAL LIGATION     X3    Family History  Problem Relation Age of Onset   Hypertension Mother    Alcohol abuse Mother    Heart disease Mother    Kidney disease Mother    Aneurysm Mother        brain   Hypertension Father  Alcohol abuse Sister    Alcohol abuse Maternal Grandmother    Alcohol abuse Maternal Grandfather    Hearing loss Daughter    Alcohol abuse Maternal Aunt    Alcohol abuse Paternal Aunt    Alcohol abuse Cousin    Stroke Other    Diabetes Other    Cancer Other    Seizures Other     Social History   Socioeconomic History   Marital status: Married    Spouse name: Pieter Partridge   Number of children: 3   Years of education: 12   Highest education level: Not on file   Occupational History    Comment: [employer]    Comment: 3rd shift  Tobacco Use   Smoking status: Never   Smokeless tobacco: Never  Vaping Use   Vaping Use: Never used  Substance and Sexual Activity   Alcohol use: No    Alcohol/week: 0.0 standard drinks of alcohol   Drug use: No   Sexual activity: Not Currently    Birth control/protection: Surgical  Other Topics Concern   Not on file  Social History Narrative   Married for 22 years.On disability secondary to schizophrenia.   Lives with husband.   Caffeine- decaf coffee, tea   Social Determinants of Health   Financial Resource Strain: Not on file  Food Insecurity: Not on file  Transportation Needs: Not on file  Physical Activity: Not on file  Stress: Not on file  Social Connections: Not on file  Intimate Partner Violence: Not on file    Outpatient Medications Prior to Visit  Medication Sig Dispense Refill   albuterol (VENTOLIN HFA) 108 (90 Base) MCG/ACT inhaler Inhale 2 puffs into the lungs every 6 (six) hours as needed for wheezing or shortness of breath. 8 g 2   amLODipine (NORVASC) 5 MG tablet Take 1 tablet (5 mg total) by mouth daily. 90 tablet 1   aspirin EC 81 MG tablet Take 81 mg by mouth daily.     atorvastatin (LIPITOR) 10 MG tablet TAKE 1 TABLET BY MOUTH EVERYDAY AT BEDTIME 90 tablet 1   benztropine (COGENTIN) 1 MG tablet TAKE 1 TABLET(1 MG) BY MOUTH AT BEDTIME 90 tablet 2   blood glucose meter kit and supplies Dispense based on patient and insurance preference. Once daily testing DX E11.9 1 each 0   Blood Glucose Monitoring Suppl (ACCU-CHEK GUIDE ME) w/Device KIT 1 Piece by Does not apply route as directed. 1 kit 0   budesonide-formoterol (SYMBICORT) 80-4.5 MCG/ACT inhaler INHALE 2 PUFFS INTO THE LUNGS TWICE A DAY 10.2 each 3   carvedilol (COREG) 12.5 MG tablet Take 1 tablet (12.5 mg total) by mouth 2 (two) times daily. 180 tablet 0   Cholecalciferol (VITAMIN D-3) 125 MCG (5000 UT) TABS Take 2 tablets by mouth daily. 30  tablet 1   clonazePAM (KLONOPIN) 0.5 MG tablet TAKE 1 TABLET BY MOUTH 2 TIMES DAILY AS NEEDED FOR ANXIETY. 180 tablet 0   Continuous Blood Gluc Receiver (DEXCOM G7 RECEIVER) DEVI Used to monitor blood glucose DX: E11.65 1 each 0   Continuous Blood Gluc Sensor (DEXCOM G7 SENSOR) MISC Used to monitor blood glucose DX E11.65 1 each 3   Dulaglutide (TRULICITY) 3 ZC/5.8IF SOPN Inject 3 mg as directed once a week. 2 mL 2   DULoxetine (CYMBALTA) 60 MG capsule Take 1 capsule (60 mg total) by mouth 2 (two) times daily. 180 capsule 2   gabapentin (NEURONTIN) 300 MG capsule Take 1 capsule (300 mg total) by mouth daily.  90 capsule 1   glipiZIDE (GLUCOTROL) 10 MG tablet Take 1 tablet (10 mg total) by mouth 2 (two) times daily before a meal. 60 tablet 3   glucose blood (ACCU-CHEK GUIDE) test strip Use as instructed 150 each 2   levothyroxine (SYNTHROID) 75 MCG tablet Take 75 mcg by mouth daily before breakfast.     losartan (COZAAR) 100 MG tablet TAKE 1 TABLET BY MOUTH EVERY DAY 90 tablet 0   lubiprostone (AMITIZA) 24 MCG capsule Take 1 capsule (24 mcg total) by mouth 2 (two) times daily with a meal. 60 capsule 5   Multiple Vitamin (MULTIVITAMIN) tablet Take 1 tablet by mouth daily.     omeprazole (PRILOSEC) 40 MG capsule TAKE 1 CAPSULE BY MOUTH TWICE A DAY 60 capsule 3   ondansetron (ZOFRAN) 4 MG tablet Take 1 tablet (4 mg total) by mouth every 8 (eight) hours as needed for nausea or vomiting. 30 tablet 3   pantoprazole (PROTONIX) 40 MG tablet Take 40 mg by mouth daily.     Polyvinyl Alcohol-Povidone PF (REFRESH) 1.4-0.6 % SOLN Place 1 drop into both eyes daily as needed (dry eyes).     risperiDONE (RISPERDAL) 2 MG tablet TAKE 1 TABLET EVERY MORNING AND 3 TABLETS AT BEDTIME 360 tablet 3   rizatriptan (MAXALT) 10 MG tablet TAKE 1 TABLET BY MOUTH AS NEEDED FOR MIGRAINE. MAY REPEAT IN 2 HOURS IF NEEDED 10 tablet 3   sucralfate (CARAFATE) 1 GM/10ML suspension Take 10 mLs (1 g total) by mouth 4 (four) times daily -   with meals and at bedtime. 420 mL 1   SUMAtriptan (IMITREX) 50 MG tablet TAKE 1 TABLET BY MOUTH EVERY 2 HOURS AS NEEDED FOR MIGRAINE. MAY REPEAT IN 2 HOURS IF HEADACHE PERSISTS OR RECURS. 3 tablet 3   topiramate (TOPAMAX) 25 MG tablet TAKE 25 MG (1 PILL) AT BEDTIME FOR ONE WEEK, THEN INCREASE TO 50 MG (2 PILLS) AT BEDTIME FOR ONE WEEK, THEN TAKE 75 MG (3 PILLS) AT BEDTIME FOR ONE WEEK, THEN TAKE 100 MG (4 PILLS) AT BEDTIME 120 tablet 3   traZODone (DESYREL) 100 MG tablet TAKE 2 TABLETS(200 MG) BY MOUTH AT BEDTIME 180 tablet 2   insulin glargine, 2 Unit Dial, (TOUJEO MAX SOLOSTAR) 300 UNIT/ML Solostar Pen Inject 80 Units into the skin at bedtime. 9 mL 2   No facility-administered medications prior to visit.    No Known Allergies  ROS Review of Systems  Constitutional:  Negative for fatigue and fever.  Eyes:  Negative for visual disturbance.  Respiratory:  Negative for chest tightness and shortness of breath.   Cardiovascular:  Negative for chest pain and palpitations.  Neurological:  Negative for dizziness and headaches.      Objective:    Physical Exam HENT:     Head: Normocephalic.  Cardiovascular:     Rate and Rhythm: Normal rate and regular rhythm.     Pulses: Normal pulses.     Heart sounds: Normal heart sounds.  Pulmonary:     Effort: Pulmonary effort is normal.     Breath sounds: Normal breath sounds.  Neurological:     Mental Status: She is alert.     BP 132/88   Pulse (!) 102   Ht 5' (1.524 m)   Wt 164 lb 1.3 oz (74.4 kg)   LMP 01/23/2013   SpO2 98%   BMI 32.04 kg/m  Wt Readings from Last 3 Encounters:  03/19/22 164 lb 1.3 oz (74.4 kg)  03/05/22 163 lb  1.3 oz (74 kg)  01/28/22 161 lb 1.3 oz (73.1 kg)    Lab Results  Component Value Date   TSH 1.000 03/05/2022   Lab Results  Component Value Date   WBC 6.1 03/05/2022   HGB 11.4 03/05/2022   HCT 33.9 (L) 03/05/2022   MCV 83 03/05/2022   PLT 270 03/05/2022   Lab Results  Component Value Date   NA  136 03/05/2022   K 3.8 03/05/2022   CO2 24 03/05/2022   GLUCOSE 331 (H) 03/05/2022   BUN 13 03/05/2022   CREATININE 0.92 03/05/2022   BILITOT 0.5 03/05/2022   ALKPHOS 118 03/05/2022   AST 13 03/05/2022   ALT 19 03/05/2022   PROT 7.2 03/05/2022   ALBUMIN 4.5 03/05/2022   CALCIUM 9.5 03/05/2022   ANIONGAP 7 07/17/2021   EGFR 75 03/05/2022   Lab Results  Component Value Date   CHOL 130 03/05/2022   Lab Results  Component Value Date   HDL 60 03/05/2022   Lab Results  Component Value Date   LDLCALC 39 03/05/2022   Lab Results  Component Value Date   TRIG 199 (H) 03/05/2022   Lab Results  Component Value Date   CHOLHDL 2.2 03/05/2022   Lab Results  Component Value Date   HGBA1C 11.2 (H) 03/05/2022      Assessment & Plan:   Problem List Items Addressed This Visit       Cardiovascular and Mediastinum   Essential (primary) hypertension    Controlled No changes to treatment regimen today Encouraged to continue taking amlodipine 5 mg daily, losartan 100 mg daily, and carvedilol 12.5 mg daily Encourage low-sodium diet, consuming less than 1500 mg of sodium daily Encouraged to continue increase physical activities BP Readings from Last 3 Encounters:  03/19/22 132/88  03/05/22 (!) 142/88  01/28/22 138/88         No orders of the defined types were placed in this encounter.   Follow-up: No follow-ups on file.    Alvira Monday, FNP

## 2022-03-19 NOTE — Patient Instructions (Signed)
I appreciate the opportunity to provide care to you today!    Follow up:  3 months  Please continue taking your blood pressure medication as prescribed  Please continue low-sodium diet, consuming less than 1500 mg of daily sodium intake      Please continue to a heart-healthy diet and increase your physical activities. Try to exercise for 22mns at least three times a week.      It was a pleasure to see you and I look forward to continuing to work together on your health and well-being. Please do not hesitate to call the office if you need care or have questions about your care.   Have a wonderful day and week. With Gratitude, GAlvira MondayMSN, FNP-BC

## 2022-03-19 NOTE — Assessment & Plan Note (Signed)
>>  ASSESSMENT AND PLAN FOR ESSENTIAL (PRIMARY) HYPERTENSION WRITTEN ON 03/19/2022 10:34 AM BY MAREN DAS, FNP  Controlled No changes to treatment regimen today Encouraged to continue taking amlodipine  5 mg daily, losartan  100 mg daily, and carvedilol  12.5 mg daily Encourage low-sodium diet, consuming less than 1500 mg of sodium daily Encouraged to continue increase physical activities BP Readings from Last 3 Encounters:  03/19/22 132/88  03/05/22 (!) 142/88  01/28/22 138/88

## 2022-03-21 NOTE — Addendum Note (Signed)
Addended byAlvira Monday on: 03/21/2022 05:33 PM   Modules accepted: Level of Service

## 2022-03-25 ENCOUNTER — Other Ambulatory Visit: Payer: Self-pay | Admitting: Family Medicine

## 2022-03-25 ENCOUNTER — Other Ambulatory Visit: Payer: Self-pay

## 2022-03-25 ENCOUNTER — Telehealth: Payer: Self-pay | Admitting: Family Medicine

## 2022-03-25 DIAGNOSIS — E119 Type 2 diabetes mellitus without complications: Secondary | ICD-10-CM

## 2022-03-25 MED ORDER — DEXCOM G7 RECEIVER DEVI: 0 refills | Status: DC

## 2022-03-25 NOTE — Telephone Encounter (Signed)
Refill sent.

## 2022-03-25 NOTE — Telephone Encounter (Signed)
Pt states she forgot to mention she is needing a refill on Continuous Blood Gluc Receiver ([place]) [place]. She is on the last one. Can you please refill?    [facility]

## 2022-03-31 ENCOUNTER — Ambulatory Visit (HOSPITAL_COMMUNITY): Payer: BC Managed Care – PPO | Admitting: Psychiatry

## 2022-04-07 ENCOUNTER — Encounter: Payer: Self-pay | Admitting: Orthopaedic Surgery

## 2022-04-09 ENCOUNTER — Ambulatory Visit (INDEPENDENT_AMBULATORY_CARE_PROVIDER_SITE_OTHER): Payer: BC Managed Care – PPO

## 2022-04-09 ENCOUNTER — Encounter: Payer: Self-pay | Admitting: Orthopaedic Surgery

## 2022-04-09 ENCOUNTER — Ambulatory Visit (INDEPENDENT_AMBULATORY_CARE_PROVIDER_SITE_OTHER): Payer: BC Managed Care – PPO | Admitting: Orthopaedic Surgery

## 2022-04-09 VITALS — BP 138/90 | HR 86 | Resp 96 | Ht 60.0 in | Wt 164.8 lb

## 2022-04-09 DIAGNOSIS — M79642 Pain in left hand: Secondary | ICD-10-CM

## 2022-04-09 DIAGNOSIS — M25549 Pain in joints of unspecified hand: Secondary | ICD-10-CM

## 2022-04-09 NOTE — Patient Instructions (Signed)
As the weather changes and gets cooler, you may notice you are affected more. You may have more pain in your joints. This is normal. Dress warmly and make sure that area is covered well.   Aspercreme, Biofreeze, Blue Emu or Voltaren Gel over the counter 2-3 times daily. Rub into area well each use for best results.  Dr.Keeling is here all day on Tuesdays, Wednesday mornings, and Thursday mornings. If you need anything such as a medication refill, please either call BEFORE the end of the day on [facility] or send a message through [place]. Your pharmacy can send a refill request for you. Calling by the end of the day on [facility] allows [place] time to send Dr.Keeling the request and for him to respond before he leaves on Thursdays.  If Dr. Luna Glasgow is out of the office, we may send it to one of the other providers and they may not refill it for the same amount that your original prescription is for.  MY NAME IS ABBY AND I AM DR.KEELING'S ASSISTANT. IF YOU NEED ANYTHING, PLEASE DO NOT HESITATE TO EITHER SEND ME A MESSAGE VIA MYCHART OR CALL THE OFFICE [phone] AND LEAVE A MESSAGE FOR ME. I WILL RESPOND WITHIN 24-48 BUSINESS HOURS.

## 2022-04-09 NOTE — Progress Notes (Signed)
Subjective:    Patient ID: Kathryn [name], female    DOB: 02-02-1971, 51 y.o.   MRN: [id number]  HPI She has bilateral hand pains, more in the mornings and more of the left hand, nondominant.  Her uncle has severe rheumatoid arthritis.  She has some swelling of the fingers at times, no numbness, no redness.  She will put the hand in warm water in the morning to help her pain.  She has irritable bowel syndrome and cannot take NSAIDs.  The pain is getting worse.  It bothers her when the hand are exposed to cold weather or after a long day at work.   Review of Systems  Constitutional:  Positive for activity change.  Musculoskeletal:  Positive for arthralgias, joint swelling and myalgias.  All other systems reviewed and are negative. For Review of Systems, all other systems reviewed and are negative.  The following is a summary of the past history medically, past history surgically, known current medicines, social history and family history.  This information is gathered electronically by the computer from prior information and documentation.  I review this each visit and have found including this information at this point in the chart is beneficial and informative.   Past Medical History:  Diagnosis Date   Anemia    Anxiety    Asthma    Depression    GERD (gastroesophageal reflux disease)    HLD (hyperlipidemia) 04/07/2019   Hypertension    Hypothyroidism, adult 04/07/2019   Neuropathy    Obesity (BMI 30.0-34.9) 04/07/2019   Schizoaffective disorder ([place])    Type II diabetes mellitus, uncontrolled 04/27/2019    Past Surgical History:  Procedure Laterality Date   BACK SURGERY  09/06/2020   BIOPSY  05/03/2021   Procedure: BIOPSY;  Surgeon: [name], MD;  [facility]: [facility];  Service: Gastroenterology;;   BREAST SURGERY Right    biopsy   CESAREAN SECTION     CHOLECYSTECTOMY  06/02/2012   Procedure: LAPAROSCOPIC CHOLECYSTECTOMY;  Surgeon: Jamesetta So,  MD;  Location: [facility];  Service: General;  Laterality: N/A;  Attempted laparoscopic cholecystectomy   CHOLECYSTECTOMY  06/02/2012   Procedure: CHOLECYSTECTOMY;  Surgeon: Jamesetta So, MD;  Location: [facility];  Service: General;  Laterality: N/A;  converted to open at  2094   COLONOSCOPY WITH PROPOFOL N/A 07/18/2020   prep was fair, one 38m polyp (inflammatory) stool in descending colon, transverse and ascending, distal rectum and anal verge normal   ESOPHAGEAL DILATION  12/31/2018   Procedure: ESOPHAGEAL DILATION;  Surgeon: RRogene Houston MD;  [facility]: [facility];  Service: Endoscopy;;   ESOPHAGOGASTRODUODENOSCOPY (EGD) WITH PROPOFOL N/A 08/24/2015   Procedure: ESOPHAGOGASTRODUODENOSCOPY (EGD) WITH PROPOFOL;  Surgeon: NRogene Houston MD;  [facility]: [facility];  Service: Endoscopy;  Laterality: N/A;  1:10 - Ann to notify pt to arrive at 11:30   ESOPHAGOGASTRODUODENOSCOPY (EGD) WITH PROPOFOL N/A 12/31/2018   normal, no abnormality to explain dysphagia, esophagus dilated.   ESOPHAGOGASTRODUODENOSCOPY (EGD) WITH PROPOFOL N/A 05/03/2021   Procedure: ESOPHAGOGASTRODUODENOSCOPY (EGD) WITH PROPOFOL;  Surgeon: CHarvel Quale MD;  [facility]: [facility];  Service: Gastroenterology;  Laterality: N/A;  1:20   FLEXIBLE SIGMOIDOSCOPY  07/17/2020   Procedure: FLEXIBLE SIGMOIDOSCOPY;  Surgeon: CHarvel Quale MD;  [facility]: [facility];  Service: Gastroenterology;;   POLYPECTOMY  07/18/2020   Procedure: POLYPECTOMY INTESTINAL;  Surgeon: CHarvel Quale MD;  [facility];  Service: Gastroenterology;;  ascending colon polyp;  SAVORY DILATION  05/03/2021   Procedure: SAVORY DILATION;  Surgeon: [name], [name], MD;  Location: [facility];  Service: Gastroenterology;;   tooth removal  2019   all teeth removed   TUBAL LIGATION     X3    Current Outpatient Medications on File Prior to Visit  Medication Sig Dispense Refill   albuterol  (VENTOLIN HFA) 108 (90 Base) MCG/ACT inhaler Inhale 2 puffs into the lungs every 6 (six) hours as needed for wheezing or shortness of breath. 8 g 2   amLODipine (NORVASC) 5 MG tablet Take 1 tablet (5 mg total) by mouth daily. 90 tablet 1   aspirin EC 81 MG tablet Take 81 mg by mouth daily.     atorvastatin (LIPITOR) 10 MG tablet TAKE 1 TABLET BY MOUTH EVERYDAY AT BEDTIME 90 tablet 1   benztropine (COGENTIN) 1 MG tablet TAKE 1 TABLET(1 MG) BY MOUTH AT BEDTIME 90 tablet 2   blood glucose meter kit and supplies Dispense based on patient and insurance preference. Once daily testing DX E11.9 1 each 0   Blood Glucose Monitoring Suppl (ACCU-CHEK GUIDE ME) w/Device KIT 1 Piece by Does not apply route as directed. 1 kit 0   budesonide-formoterol (SYMBICORT) 80-4.5 MCG/ACT inhaler INHALE 2 PUFFS INTO THE LUNGS TWICE A DAY 10.2 each 3   carvedilol (COREG) 12.5 MG tablet Take 1 tablet (12.5 mg total) by mouth 2 (two) times daily. 180 tablet 0   Cholecalciferol (VITAMIN D-3) 125 MCG (5000 UT) TABS Take 2 tablets by mouth daily. 30 tablet 1   clonazePAM (KLONOPIN) 0.5 MG tablet TAKE 1 TABLET BY MOUTH 2 TIMES DAILY AS NEEDED FOR ANXIETY. 180 tablet 0   Continuous Blood Gluc Receiver (DEXCOM G7 RECEIVER) DEVI Used to monitor blood glucose DX: E11.65 1 each 0   Continuous Blood Gluc Sensor (DEXCOM G7 SENSOR) MISC Used to monitor blood glucose DX E11.65 1 each 3   Dulaglutide (TRULICITY) 3 BH/4.1PF SOPN Inject 3 mg as directed once a week. 2 mL 2   DULoxetine (CYMBALTA) 60 MG capsule Take 1 capsule (60 mg total) by mouth 2 (two) times daily. 180 capsule 2   gabapentin (NEURONTIN) 300 MG capsule Take 1 capsule (300 mg total) by mouth daily. 90 capsule 1   glipiZIDE (GLUCOTROL) 10 MG tablet Take 1 tablet (10 mg total) by mouth 2 (two) times daily before a meal. 60 tablet 3   glucose blood (ACCU-CHEK GUIDE) test strip Use as instructed 150 each 2   levothyroxine (SYNTHROID) 75 MCG tablet Take 75 mcg by mouth daily  before breakfast.     losartan (COZAAR) 100 MG tablet TAKE 1 TABLET BY MOUTH EVERY DAY 90 tablet 0   lubiprostone (AMITIZA) 24 MCG capsule Take 1 capsule (24 mcg total) by mouth 2 (two) times daily with a meal. 60 capsule 5   Multiple Vitamin (MULTIVITAMIN) tablet Take 1 tablet by mouth daily.     omeprazole (PRILOSEC) 40 MG capsule TAKE 1 CAPSULE BY MOUTH TWICE A DAY 60 capsule 3   ondansetron (ZOFRAN) 4 MG tablet Take 1 tablet (4 mg total) by mouth every 8 (eight) hours as needed for nausea or vomiting. 30 tablet 3   pantoprazole (PROTONIX) 40 MG tablet Take 40 mg by mouth daily.     Polyvinyl Alcohol-Povidone PF (REFRESH) 1.4-0.6 % SOLN Place 1 drop into both eyes daily as needed (dry eyes).     risperiDONE (RISPERDAL) 2 MG tablet TAKE 1 TABLET EVERY MORNING AND 3 TABLETS AT  BEDTIME 360 tablet 3   rizatriptan (MAXALT) 10 MG tablet TAKE 1 TABLET BY MOUTH AS NEEDED FOR MIGRAINE. MAY REPEAT IN 2 HOURS IF NEEDED 10 tablet 3   sucralfate (CARAFATE) 1 GM/10ML suspension Take 10 mLs (1 g total) by mouth 4 (four) times daily -  with meals and at bedtime. 420 mL 1   SUMAtriptan (IMITREX) 50 MG tablet TAKE 1 TABLET BY MOUTH EVERY 2 HOURS AS NEEDED FOR MIGRAINE. MAY REPEAT IN 2 HOURS IF HEADACHE PERSISTS OR RECURS. 3 tablet 3   topiramate (TOPAMAX) 25 MG tablet TAKE 25 MG (1 PILL) AT BEDTIME FOR ONE WEEK, THEN INCREASE TO 50 MG (2 PILLS) AT BEDTIME FOR ONE WEEK, THEN TAKE 75 MG (3 PILLS) AT BEDTIME FOR ONE WEEK, THEN TAKE 100 MG (4 PILLS) AT BEDTIME 120 tablet 3   traZODone (DESYREL) 100 MG tablet TAKE 2 TABLETS(200 MG) BY MOUTH AT BEDTIME 180 tablet 2   insulin glargine, 2 Unit Dial, (TOUJEO MAX SOLOSTAR) 300 UNIT/ML Solostar Pen Inject 80 Units into the skin at bedtime. 9 mL 2   No current facility-administered medications on file prior to visit.    Social History   Socioeconomic History   Marital status: Married    Spouse name: Pieter Partridge   Number of children: 3   Years of education: 12   Highest  education level: Not on file  Occupational History    Comment: [employer]    Comment: 3rd shift  Tobacco Use   Smoking status: Never   Smokeless tobacco: Never  Vaping Use   Vaping Use: Never used  Substance and Sexual Activity   Alcohol use: No    Alcohol/week: 0.0 standard drinks of alcohol   Drug use: No   Sexual activity: Not Currently    Birth control/protection: Surgical  Other Topics Concern   Not on file  Social History Narrative   Married for 22 years.On disability secondary to schizophrenia.   Lives with husband.   Caffeine- decaf coffee, tea   Social Determinants of Health   Financial Resource Strain: Not on file  Food Insecurity: Not on file  Transportation Needs: Not on file  Physical Activity: Not on file  Stress: Not on file  Social Connections: Not on file  Intimate Partner Violence: Not on file    Family History  Problem Relation Age of Onset   Hypertension Mother    Alcohol abuse Mother    Heart disease Mother    Kidney disease Mother    Aneurysm Mother        brain   Hypertension Father    Alcohol abuse Sister    Alcohol abuse Maternal Grandmother    Alcohol abuse Maternal Grandfather    Hearing loss Daughter    Alcohol abuse Maternal Aunt    Alcohol abuse Paternal Aunt    Alcohol abuse Cousin    Stroke Other    Diabetes Other    Cancer Other    Seizures Other     BP (!) 138/90   Pulse 86   Resp (!) 96   Ht 5' (1.524 m)   Wt 164 lb 12.8 oz (74.8 kg)   LMP 01/23/2013   BMI 32.19 kg/m   Body mass index is 32.19 kg/m.      Objective:   Physical Exam Vitals and nursing note reviewed. Exam conducted with a chaperone present.  Constitutional:      Appearance: She is well-developed.  HENT:     Head: Normocephalic and atraumatic.  Eyes:     Conjunctiva/sclera: Conjunctivae normal.     Pupils: Pupils are equal, round, and reactive to light.  Cardiovascular:     Rate and Rhythm: Normal rate and regular rhythm.  Pulmonary:     Effort:  Pulmonary effort is normal.  Abdominal:     Palpations: Abdomen is soft.  Musculoskeletal:       Hands:     Cervical back: Normal range of motion and neck supple.  Skin:    General: Skin is warm and dry.  Neurological:     Mental Status: She is alert and oriented to person, place, and time.     Cranial Nerves: No cranial nerve deficit.     Motor: No abnormal muscle tone.     Coordination: Coordination normal.     Deep Tendon Reflexes: Reflexes are normal and symmetric. Reflexes normal.  Psychiatric:        Behavior: Behavior normal.        Thought Content: Thought content normal.        Judgment: Judgment normal.   X-rays were done of the left hand, reported separately.        Assessment & Plan:   Encounter Diagnoses  Name Primary?   Pain in left hand Yes   Pain in multiple finger joints    I am concerned about possible rheumatoid arthritis.  I will get ANA, RA latex, Sed rate, C-reactive protein and CBC and diff.  I have told her about Aspercreme, Biofreeze and Voltaren Gel.  I have told her about paraffin bath.  She cannot take NSAIDs.  Return in three weeks.  Call if any problem.  Precautions discussed.  Electronically Signed Sanjuana Kava, MD 11/15/20239:46 AM

## 2022-04-11 ENCOUNTER — Telehealth (HOSPITAL_COMMUNITY): Payer: BC Managed Care – PPO | Admitting: Psychiatry

## 2022-04-11 ENCOUNTER — Encounter (HOSPITAL_COMMUNITY): Payer: Self-pay

## 2022-04-11 DIAGNOSIS — M79642 Pain in left hand: Secondary | ICD-10-CM | POA: Diagnosis not present

## 2022-04-11 DIAGNOSIS — M25549 Pain in joints of unspecified hand: Secondary | ICD-10-CM | POA: Diagnosis not present

## 2022-04-12 LAB — CBC WITH DIFFERENTIAL/PLATELET
Absolute Monocytes: 389 cells/uL (ref 200–950)
Basophils Absolute: 47 cells/uL (ref 0–200)
Basophils Relative: 0.8 %
Eosinophils Absolute: 189 cells/uL (ref 15–500)
Eosinophils Relative: 3.2 %
HCT: 33.6 % — ABNORMAL LOW (ref 35.0–45.0)
Hemoglobin: 11.3 g/dL — ABNORMAL LOW (ref 11.7–15.5)
Lymphs Abs: 3103 cells/uL (ref 850–3900)
MCH: 28.8 pg (ref 27.0–33.0)
MCHC: 33.6 g/dL (ref 32.0–36.0)
MCV: 85.7 fL (ref 80.0–100.0)
MPV: 11.4 fL (ref 7.5–12.5)
Monocytes Relative: 6.6 %
Neutro Abs: 2171 cells/uL (ref 1500–7800)
Neutrophils Relative %: 36.8 %
Platelets: 275 10*3/uL (ref 140–400)
RBC: 3.92 10*6/uL (ref 3.80–5.10)
RDW: 12.1 % (ref 11.0–15.0)
Total Lymphocyte: 52.6 %
WBC: 5.9 10*3/uL (ref 3.8–10.8)

## 2022-04-12 LAB — RHEUMATOID FACTOR: Rheumatoid fact SerPl-aCnc: <14 IU/mL (ref <14)

## 2022-04-12 LAB — SEDIMENTATION RATE: Sed Rate: 9 mm/h (ref 0–30)

## 2022-04-12 LAB — ANTI-DNA ANTIBODY, DOUBLE-STRANDED: ds DNA Ab: <1 IU/mL

## 2022-04-16 ENCOUNTER — Telehealth (HOSPITAL_COMMUNITY): Payer: BC Managed Care – PPO | Admitting: Psychiatry

## 2022-04-21 ENCOUNTER — Other Ambulatory Visit: Payer: Self-pay | Admitting: Nurse Practitioner

## 2022-04-29 ENCOUNTER — Ambulatory Visit: Payer: BC Managed Care – PPO | Admitting: Family Medicine

## 2022-04-30 ENCOUNTER — Ambulatory Visit (INDEPENDENT_AMBULATORY_CARE_PROVIDER_SITE_OTHER): Payer: BC Managed Care – PPO | Admitting: Orthopaedic Surgery

## 2022-04-30 ENCOUNTER — Encounter: Payer: Self-pay | Admitting: Orthopaedic Surgery

## 2022-04-30 VITALS — BP 153/90 | HR 97 | Ht 60.0 in | Wt 158.0 lb

## 2022-04-30 DIAGNOSIS — M25549 Pain in joints of unspecified hand: Secondary | ICD-10-CM | POA: Diagnosis not present

## 2022-04-30 NOTE — Patient Instructions (Addendum)
Continue to do the paraffin wax baths. I am glad those are helping!!! Keep your hands covered when you are in the cold even if you're only in the cold for a couple of mins.   Dr.Keeling is here all day on Tuesdays, Wednesday mornings, and Thursday mornings. If you need anything such as a medication refill, please either call BEFORE the end of the day on [facility] or send a message through [place]. Your pharmacy can send a refill request for you. Calling by the end of the day on [facility] allows [place] time to send Dr.Keeling the request and for him to respond before he leaves on Thursdays.  If Dr. Luna Glasgow is out of the office, we may send it to one of the other providers and they may not refill it for the same amount that your original prescription is for.

## 2022-04-30 NOTE — Progress Notes (Signed)
I got that paraffin bath and it works  She has rheumatoid symptoms of the hands. She cannot take NSAIDs.  At last visit I recommended a paraffin bath.  She got one and it helps her greatly.  She uses it often, more in the mornings.  She has no trauma.  She has no numbness.  Her rheumatoid factor was negative.  Her blood sugars were elevated.  ANA was negative and Sed rate was 9.  I have reviewed the labs.  NV intact.  She has very slight fusiform swelling of the fingers but no redness or deformity.  Grips are good.  Encounter Diagnosis  Name Primary?   Pain in multiple finger joints Yes   Continue the paraffin baths.  Return as needed.  Call if any problem.  Precautions discussed.  Electronically Little Browning, MD 12/6/20239:13 AM

## 2022-05-06 ENCOUNTER — Encounter (INDEPENDENT_AMBULATORY_CARE_PROVIDER_SITE_OTHER): Payer: Self-pay | Admitting: Gastroenterology

## 2022-05-18 ENCOUNTER — Other Ambulatory Visit: Payer: Self-pay | Admitting: Family Medicine

## 2022-05-18 DIAGNOSIS — E119 Type 2 diabetes mellitus without complications: Secondary | ICD-10-CM

## 2022-05-20 ENCOUNTER — Encounter: Payer: Self-pay | Admitting: Family Medicine

## 2022-05-20 ENCOUNTER — Other Ambulatory Visit (INDEPENDENT_AMBULATORY_CARE_PROVIDER_SITE_OTHER): Payer: Self-pay | Admitting: Gastroenterology

## 2022-05-20 ENCOUNTER — Other Ambulatory Visit: Payer: Self-pay

## 2022-05-20 ENCOUNTER — Other Ambulatory Visit: Payer: Self-pay | Admitting: Family Medicine

## 2022-05-20 ENCOUNTER — Ambulatory Visit (INDEPENDENT_AMBULATORY_CARE_PROVIDER_SITE_OTHER): Payer: BC Managed Care – PPO | Admitting: Family Medicine

## 2022-05-20 ENCOUNTER — Other Ambulatory Visit: Payer: Self-pay | Admitting: Psychiatry

## 2022-05-20 VITALS — BP 127/84 | HR 94 | Ht 60.0 in | Wt 159.1 lb

## 2022-05-20 DIAGNOSIS — E119 Type 2 diabetes mellitus without complications: Secondary | ICD-10-CM

## 2022-05-20 DIAGNOSIS — I10 Essential (primary) hypertension: Secondary | ICD-10-CM | POA: Diagnosis not present

## 2022-05-20 DIAGNOSIS — E1165 Type 2 diabetes mellitus with hyperglycemia: Secondary | ICD-10-CM | POA: Diagnosis not present

## 2022-05-20 DIAGNOSIS — E038 Other specified hypothyroidism: Secondary | ICD-10-CM

## 2022-05-20 DIAGNOSIS — Z794 Long term (current) use of insulin: Secondary | ICD-10-CM

## 2022-05-20 DIAGNOSIS — F419 Anxiety disorder, unspecified: Secondary | ICD-10-CM | POA: Diagnosis not present

## 2022-05-20 DIAGNOSIS — R7301 Impaired fasting glucose: Secondary | ICD-10-CM

## 2022-05-20 DIAGNOSIS — G43009 Migraine without aura, not intractable, without status migrainosus: Secondary | ICD-10-CM

## 2022-05-20 DIAGNOSIS — E7849 Other hyperlipidemia: Secondary | ICD-10-CM | POA: Diagnosis not present

## 2022-05-20 DIAGNOSIS — E559 Vitamin D deficiency, unspecified: Secondary | ICD-10-CM | POA: Diagnosis not present

## 2022-05-20 DIAGNOSIS — G629 Polyneuropathy, unspecified: Secondary | ICD-10-CM

## 2022-05-20 DIAGNOSIS — K30 Functional dyspepsia: Secondary | ICD-10-CM

## 2022-05-20 DIAGNOSIS — R112 Nausea with vomiting, unspecified: Secondary | ICD-10-CM

## 2022-05-20 MED ORDER — CARVEDILOL 12.5 MG PO TABS: 12.5000 mg | ORAL_TABLET | Freq: Two times a day (BID) | ORAL | 0 refills | Status: DC

## 2022-05-20 MED ORDER — METOCLOPRAMIDE HCL 5 MG PO TABS: 5.0000 mg | ORAL_TABLET | Freq: Four times a day (QID) | ORAL | 0 refills | Status: DC

## 2022-05-20 MED ORDER — ATORVASTATIN CALCIUM 10 MG PO TABS: ORAL_TABLET | ORAL | 1 refills | Status: DC

## 2022-05-20 MED ORDER — GABAPENTIN 300 MG PO CAPS: 300.0000 mg | ORAL_CAPSULE | Freq: Every day | ORAL | 1 refills | Status: DC

## 2022-05-20 MED ORDER — DEXCOM G7 RECEIVER DEVI: 0 refills | Status: DC

## 2022-05-20 MED ORDER — RIZATRIPTAN BENZOATE 10 MG PO TABS: 10.0000 mg | ORAL_TABLET | ORAL | 3 refills | Status: DC | PRN

## 2022-05-20 MED ORDER — GLIPIZIDE 10 MG PO TABS: 10.0000 mg | ORAL_TABLET | Freq: Two times a day (BID) | ORAL | 3 refills | Status: DC

## 2022-05-20 NOTE — Patient Instructions (Signed)
I appreciate the opportunity to provide care to you today!    Follow up:  3 months   Please pick up your appointment at the pharmacy    Please continue to a heart-healthy diet and increase your physical activities. Try to exercise for 37mns at least three times a week.      It was a pleasure to see you and I look forward to continuing to work together on your health and well-being. Please do not hesitate to call the office if you need care or have questions about your care.   Have a wonderful day and week. With Gratitude, GAlvira MondayMSN, FNP-BC

## 2022-05-20 NOTE — Assessment & Plan Note (Signed)
She reports increased anxiety for a week She shares that she has not been taking her clonazepam 0.5 mg twice daily as needed Encouraged patient to take medication as prescribed She denies suicidal thoughts and ideation

## 2022-05-20 NOTE — Assessment & Plan Note (Signed)
Controlled No changes to treatment regimen today Encouraged to continue taking amlodipine 5 mg daily, losartan 100 mg daily, and carvedilol 12.5 mg daily Encourage low-sodium diet, consuming less than 1500 mg of sodium daily Encouraged to continue increase physical activities BP Readings from Last 3 Encounters:  05/20/22 127/84  04/30/22 (!) 153/90  04/09/22 (!) 138/90

## 2022-05-20 NOTE — Assessment & Plan Note (Signed)
>>  ASSESSMENT AND PLAN FOR UNCONTROLLED DIABETES MELLITUS WITH HYPERGLYCEMIA, WITH LONG-TERM CURRENT USE OF INSULIN  (HCC) WRITTEN ON 05/20/2022  4:08 PM BY ZARWOLO, MEADE, FNP  She takes TOUJEO  80 units at bedtime, glipizide  10 mg twice daily and Trulicity  3 mg weekly subcu She reports that she has not been taking her Trulicity  3 mg weekly injection and has not scheduled an appointment with her endocrinologist Encourage the patient to follow up with her endocrinologist Will assess hemoglobin A1c today She denies polyuria, polydipsia, polyphagia Lab Results  Component Value Date   HGBA1C 11.2 (H) 03/05/2022

## 2022-05-20 NOTE — Assessment & Plan Note (Signed)
>>  ASSESSMENT AND PLAN FOR ESSENTIAL (PRIMARY) HYPERTENSION WRITTEN ON 05/20/2022  3:35 PM BY ZARWOLO, GLORIA, FNP  Controlled No changes to treatment regimen today Encouraged to continue taking amlodipine  5 mg daily, losartan  100 mg daily, and carvedilol  12.5 mg daily Encourage low-sodium diet, consuming less than 1500 mg of sodium daily Encouraged to continue increase physical activities BP Readings from Last 3 Encounters:  05/20/22 127/84  04/30/22 (!) 153/90  04/09/22 (!) 138/90

## 2022-05-20 NOTE — Progress Notes (Signed)
Established Patient Office Visit  Subjective:  Patient ID: Kathryn [name], female    DOB: 04/14/1971  Age: 51 y.o. MRN: [id number]  CC:  Chief Complaint  Patient presents with   Follow-up    3 month f/u, pt has concerns about her anxiety, sleeping a lot here lately.     HPI Kathryn [name] is a 51 y.o. female with past medical history of essential hypertension, moderate persistent asthma, chronic GERD, type 2 diabetes, hyperlipidemia, and depression presents for f/u of  chronic medical conditions. For the details of today's visit, please refer to the assessment and plan.      Past Medical History:  Diagnosis Date   Anemia    Anxiety    Asthma    Depression    GERD (gastroesophageal reflux disease)    HLD (hyperlipidemia) 04/07/2019   Hypertension    Hypothyroidism, adult 04/07/2019   Neuropathy    Obesity (BMI 30.0-34.9) 04/07/2019   Schizoaffective disorder ([place])    Type II diabetes mellitus, uncontrolled 04/27/2019    Past Surgical History:  Procedure Laterality Date   BACK SURGERY  09/06/2020   BIOPSY  05/03/2021   Procedure: BIOPSY;  Surgeon: [name], MD;  [facility];  Service: Gastroenterology;;   BREAST SURGERY Right    biopsy   CESAREAN SECTION     CHOLECYSTECTOMY  06/02/2012   Procedure: LAPAROSCOPIC CHOLECYSTECTOMY;  Surgeon: Jamesetta So, MD;  Location: [facility];  Service: General;  Laterality: N/A;  Attempted laparoscopic cholecystectomy   CHOLECYSTECTOMY  06/02/2012   Procedure: CHOLECYSTECTOMY;  Surgeon: Jamesetta So, MD;  Location: [facility];  Service: General;  Laterality: N/A;  converted to open at  3149   COLONOSCOPY WITH PROPOFOL N/A 07/18/2020   prep was fair, one 34m polyp (inflammatory) stool in descending colon, transverse and ascending, distal rectum and anal verge normal   ESOPHAGEAL DILATION  12/31/2018   Procedure: ESOPHAGEAL DILATION;  Surgeon: RRogene Houston MD;  [facility]: [facility];  Service:  Endoscopy;;   ESOPHAGOGASTRODUODENOSCOPY (EGD) WITH PROPOFOL N/A 08/24/2015   Procedure: ESOPHAGOGASTRODUODENOSCOPY (EGD) WITH PROPOFOL;  Surgeon: NRogene Houston MD;  [facility];  Service: Endoscopy;  Laterality: N/A;  1:10 - Ann to notify pt to arrive at 11:30   ESOPHAGOGASTRODUODENOSCOPY (EGD) WITH PROPOFOL N/A 12/31/2018   normal, no abnormality to explain dysphagia, esophagus dilated.   ESOPHAGOGASTRODUODENOSCOPY (EGD) WITH PROPOFOL N/A 05/03/2021   Procedure: ESOPHAGOGASTRODUODENOSCOPY (EGD) WITH PROPOFOL;  Surgeon: CHarvel Quale MD;  [facility];  Service: Gastroenterology;  Laterality: N/A;  1:20   FLEXIBLE SIGMOIDOSCOPY  07/17/2020   Procedure: FLEXIBLE SIGMOIDOSCOPY;  Surgeon: CHarvel Quale MD;  [facility];  Service: Gastroenterology;;   POLYPECTOMY  07/18/2020   Procedure: POLYPECTOMY INTESTINAL;  Surgeon: CHarvel Quale MD;  [facility];  Service: Gastroenterology;;  ascending colon polyp;    SAVORY DILATION  05/03/2021   Procedure: SAVORY DILATION;  Surgeon: CHarvel Quale MD;  [facility];  Service: Gastroenterology;;   tooth removal  2019   all teeth removed   TUBAL LIGATION     X3    Family History  Problem Relation Age of Onset   Hypertension Mother    Alcohol abuse Mother    Heart disease Mother    Kidney disease Mother    Aneurysm Mother        brain   Hypertension Father    Alcohol abuse Sister  Alcohol abuse Maternal Grandmother    Alcohol abuse Maternal Grandfather    Hearing loss Daughter    Alcohol abuse Maternal Aunt    Alcohol abuse Paternal Aunt    Alcohol abuse Cousin    Stroke Other    Diabetes Other    Cancer Other    Seizures Other     Social History   Socioeconomic History   Marital status: Married    Spouse name: Pieter Partridge   Number of children: 3   Years of education: 12   Highest education level: Not on file  Occupational  History    Comment: [employer]    Comment: 3rd shift  Tobacco Use   Smoking status: Never   Smokeless tobacco: Never  Vaping Use   Vaping Use: Never used  Substance and Sexual Activity   Alcohol use: No    Alcohol/week: 0.0 standard drinks of alcohol   Drug use: No   Sexual activity: Not Currently    Birth control/protection: Surgical  Other Topics Concern   Not on file  Social History Narrative   Married for 22 years.On disability secondary to schizophrenia.   Lives with husband.   Caffeine- decaf coffee, tea   Social Determinants of Health   Financial Resource Strain: Not on file  Food Insecurity: Not on file  Transportation Needs: Not on file  Physical Activity: Not on file  Stress: Not on file  Social Connections: Not on file  Intimate Partner Violence: Not on file    Outpatient Medications Prior to Visit  Medication Sig Dispense Refill   albuterol (VENTOLIN HFA) 108 (90 Base) MCG/ACT inhaler Inhale 2 puffs into the lungs every 6 (six) hours as needed for wheezing or shortness of breath. 8 g 2   amLODipine (NORVASC) 5 MG tablet Take 1 tablet (5 mg total) by mouth daily. 90 tablet 1   aspirin EC 81 MG tablet Take 81 mg by mouth daily.     benztropine (COGENTIN) 1 MG tablet TAKE 1 TABLET(1 MG) BY MOUTH AT BEDTIME 90 tablet 2   blood glucose meter kit and supplies Dispense based on patient and insurance preference. Once daily testing DX E11.9 1 each 0   Blood Glucose Monitoring Suppl (ACCU-CHEK GUIDE ME) w/Device KIT 1 Piece by Does not apply route as directed. 1 kit 0   budesonide-formoterol (SYMBICORT) 80-4.5 MCG/ACT inhaler INHALE 2 PUFFS INTO THE LUNGS TWICE A DAY 10.2 each 3   Cholecalciferol (VITAMIN D-3) 125 MCG (5000 UT) TABS Take 2 tablets by mouth daily. 30 tablet 1   clonazePAM (KLONOPIN) 0.5 MG tablet TAKE 1 TABLET BY MOUTH 2 TIMES DAILY AS NEEDED FOR ANXIETY. 180 tablet 0   Continuous Blood Gluc Sensor (DEXCOM G7 SENSOR) MISC Used to monitor blood glucose DX E11.65 1  each 3   Dulaglutide (TRULICITY) 3 XY/8.0XK SOPN Inject 3 mg as directed once a week. 2 mL 2   DULoxetine (CYMBALTA) 60 MG capsule Take 1 capsule (60 mg total) by mouth 2 (two) times daily. 180 capsule 2   glucose blood (ACCU-CHEK GUIDE) test strip Use as instructed 150 each 2   levothyroxine (SYNTHROID) 75 MCG tablet Take 75 mcg by mouth daily before breakfast.     losartan (COZAAR) 100 MG tablet TAKE 1 TABLET BY MOUTH EVERY DAY 30 tablet 2   lubiprostone (AMITIZA) 24 MCG capsule Take 1 capsule (24 mcg total) by mouth 2 (two) times daily with a meal. 60 capsule 5   Multiple Vitamin (MULTIVITAMIN) tablet Take 1 tablet  by mouth daily.     omeprazole (PRILOSEC) 40 MG capsule TAKE 1 CAPSULE BY MOUTH TWICE A DAY 60 capsule 3   ondansetron (ZOFRAN) 4 MG tablet Take 1 tablet (4 mg total) by mouth every 8 (eight) hours as needed for nausea or vomiting. 30 tablet 3   pantoprazole (PROTONIX) 40 MG tablet Take 40 mg by mouth daily.     Polyvinyl Alcohol-Povidone PF (REFRESH) 1.4-0.6 % SOLN Place 1 drop into both eyes daily as needed (dry eyes).     risperiDONE (RISPERDAL) 2 MG tablet TAKE 1 TABLET EVERY MORNING AND 3 TABLETS AT BEDTIME 360 tablet 3   sucralfate (CARAFATE) 1 GM/10ML suspension Take 10 mLs (1 g total) by mouth 4 (four) times daily -  with meals and at bedtime. 420 mL 1   SUMAtriptan (IMITREX) 50 MG tablet TAKE 1 TABLET BY MOUTH EVERY 2 HOURS AS NEEDED FOR MIGRAINE. MAY REPEAT IN 2 HOURS IF HEADACHE PERSISTS OR RECURS. 3 tablet 3   traZODone (DESYREL) 100 MG tablet TAKE 2 TABLETS(200 MG) BY MOUTH AT BEDTIME 180 tablet 2   atorvastatin (LIPITOR) 10 MG tablet TAKE 1 TABLET BY MOUTH EVERYDAY AT BEDTIME 90 tablet 1   carvedilol (COREG) 12.5 MG tablet Take 1 tablet (12.5 mg total) by mouth 2 (two) times daily. 180 tablet 0   Continuous Blood Gluc Receiver (DEXCOM G7 RECEIVER) DEVI USED TO MONITOR BLOOD GLUCOSE DX: E11.65 1 each 0   gabapentin (NEURONTIN) 300 MG capsule Take 1 capsule (300 mg total)  by mouth daily. 90 capsule 1   glipiZIDE (GLUCOTROL) 10 MG tablet Take 1 tablet (10 mg total) by mouth 2 (two) times daily before a meal. 60 tablet 3   rizatriptan (MAXALT) 10 MG tablet TAKE 1 TABLET BY MOUTH AS NEEDED FOR MIGRAINE. MAY REPEAT IN 2 HOURS IF NEEDED 10 tablet 3   topiramate (TOPAMAX) 25 MG tablet TAKE 25 MG (1 PILL) AT BEDTIME FOR ONE WEEK, THEN INCREASE TO 50 MG (2 PILLS) AT BEDTIME FOR ONE WEEK, THEN TAKE 75 MG (3 PILLS) AT BEDTIME FOR ONE WEEK, THEN TAKE 100 MG (4 PILLS) AT BEDTIME 120 tablet 3   insulin glargine, 2 Unit Dial, (TOUJEO MAX SOLOSTAR) 300 UNIT/ML Solostar Pen Inject 80 Units into the skin at bedtime. 9 mL 2   No facility-administered medications prior to visit.    No Known Allergies  ROS Review of Systems  Constitutional:  Negative for chills and fever.  Eyes:  Negative for visual disturbance.  Respiratory:  Negative for chest tightness and shortness of breath.   Endocrine: Positive for polydipsia, polyphagia and polyuria.  Neurological:  Negative for dizziness and headaches.  Psychiatric/Behavioral:  Negative for self-injury and suicidal ideas.       Objective:    Physical Exam HENT:     Head: Normocephalic.     Mouth/Throat:     Mouth: Mucous membranes are moist.  Cardiovascular:     Rate and Rhythm: Normal rate.     Heart sounds: Normal heart sounds.  Pulmonary:     Effort: Pulmonary effort is normal.     Breath sounds: Normal breath sounds.  Neurological:     Mental Status: She is alert.     BP 127/84   Pulse 94   Ht 5' (1.524 m)   Wt 159 lb 1.3 oz (72.2 kg)   LMP 01/23/2013   SpO2 95%   BMI 31.07 kg/m  Wt Readings from Last 3 Encounters:  05/20/22 159 lb 1.3 oz (  72.2 kg)  04/30/22 158 lb (71.7 kg)  04/09/22 164 lb 12.8 oz (74.8 kg)    Lab Results  Component Value Date   TSH 1.000 03/05/2022   Lab Results  Component Value Date   WBC 5.9 04/11/2022   HGB 11.3 (L) 04/11/2022   HCT 33.6 (L) 04/11/2022   MCV 85.7  04/11/2022   PLT 275 04/11/2022   Lab Results  Component Value Date   NA 136 03/05/2022   K 3.8 03/05/2022   CO2 24 03/05/2022   GLUCOSE 331 (H) 03/05/2022   BUN 13 03/05/2022   CREATININE 0.92 03/05/2022   BILITOT 0.5 03/05/2022   ALKPHOS 118 03/05/2022   AST 13 03/05/2022   ALT 19 03/05/2022   PROT 7.2 03/05/2022   ALBUMIN 4.5 03/05/2022   CALCIUM 9.5 03/05/2022   ANIONGAP 7 07/17/2021   EGFR 75 03/05/2022   Lab Results  Component Value Date   CHOL 130 03/05/2022   Lab Results  Component Value Date   HDL 60 03/05/2022   Lab Results  Component Value Date   LDLCALC 39 03/05/2022   Lab Results  Component Value Date   TRIG 199 (H) 03/05/2022   Lab Results  Component Value Date   CHOLHDL 2.2 03/05/2022   Lab Results  Component Value Date   HGBA1C 11.2 (H) 03/05/2022      Assessment & Plan:  Uncontrolled diabetes mellitus with hyperglycemia, with long-term current use of insulin (HCC) Assessment & Plan: She takes TOUJEO 80 units at bedtime, glipizide 10 mg twice daily and Trulicity 3 mg weekly subcu She reports that she has not been taking her Trulicity 3 mg weekly injection and has not scheduled an appointment with her endocrinologist Encourage the patient to follow up with her endocrinologist Will assess hemoglobin A1c today She denies polyuria, polydipsia, polyphagia Lab Results  Component Value Date   HGBA1C 11.2 (H) 03/05/2022      Anxiety Assessment & Plan: She reports increased anxiety for a week She shares that she has not been taking her clonazepam 0.5 mg twice daily as needed Encouraged patient to take medication as prescribed She denies suicidal thoughts and ideation   Type 2 diabetes mellitus without complication, without long-term current use of insulin ([place]) -     Dexcom G7 Receiver; USED TO MONITOR BLOOD GLUCOSE DX: E11.65  Dispense: 1 each; Refill: 0  Essential (primary) hypertension Assessment & Plan: Controlled No changes to  treatment regimen today Encouraged to continue taking amlodipine 5 mg daily, losartan 100 mg daily, and carvedilol 12.5 mg daily Encourage low-sodium diet, consuming less than 1500 mg of sodium daily Encouraged to continue increase physical activities BP Readings from Last 3 Encounters:  05/20/22 127/84  04/30/22 (!) 153/90  04/09/22 (!) 138/90    Orders: -     CMP14+EGFR -     CBC with Differential/Platelet  Essential hypertension, benign -     Atorvastatin Calcium; Take 1 tablet by mouth everyday at bedtime  Dispense: 90 tablet; Refill: 1  IFG (impaired fasting glucose) -     Hemoglobin A1c  Vitamin D deficiency -     VITAMIN D 25 Hydroxy (Vit-D Deficiency, Fractures)  Other specified hypothyroidism -     TSH + free T4  Other hyperlipidemia -     Lipid panel  Polyneuropathy -     Gabapentin; Take 1 capsule (300 mg total) by mouth daily.  Dispense: 90 capsule; Refill: 1  Nausea and vomiting, unspecified vomiting type -  Metoclopramide HCl; Take 1 tablet (5 mg total) by mouth 4 (four) times daily.  Dispense: 120 tablet; Refill: 0  Migraine without aura and without status migrainosus, not intractable -     Rizatriptan Benzoate; Take 1 tablet (10 mg total) by mouth as needed for migraine. May repeat in 2 hours if needed  Dispense: 10 tablet; Refill: 3    Follow-up: Return in about 3 months (around 08/19/2022).   Alvira Monday, FNP

## 2022-05-20 NOTE — Assessment & Plan Note (Signed)
She takes TOUJEO 80 units at bedtime, glipizide 10 mg twice daily and Trulicity 3 mg weekly subcu She reports that she has not been taking her Trulicity 3 mg weekly injection and has not scheduled an appointment with her endocrinologist Encourage the patient to follow up with her endocrinologist Will assess hemoglobin A1c today She denies polyuria, polydipsia, polyphagia Lab Results  Component Value Date   HGBA1C 11.2 (H) 03/05/2022

## 2022-05-21 LAB — CMP14+EGFR
ALT: 16 IU/L (ref 0–32)
AST: 11 IU/L (ref 0–40)
Albumin/Globulin Ratio: 1.8 (ref 1.2–2.2)
Albumin: 4.6 g/dL (ref 3.8–4.9)
Alkaline Phosphatase: 110 IU/L (ref 44–121)
BUN/Creatinine Ratio: 13 (ref 9–23)
BUN: 13 mg/dL (ref 6–24)
Bilirubin Total: 0.8 mg/dL (ref 0.0–1.2)
CO2: 25 mmol/L (ref 20–29)
Calcium: 9.6 mg/dL (ref 8.7–10.2)
Chloride: 99 mmol/L (ref 96–106)
Creatinine, Ser: 1.02 mg/dL — ABNORMAL HIGH (ref 0.57–1.00)
Globulin, Total: 2.6 g/dL (ref 1.5–4.5)
Glucose: 296 mg/dL — ABNORMAL HIGH (ref 70–99)
Potassium: 4.2 mmol/L (ref 3.5–5.2)
Sodium: 138 mmol/L (ref 134–144)
Total Protein: 7.2 g/dL (ref 6.0–8.5)
eGFR: 67 mL/min/{1.73_m2} (ref >59)

## 2022-05-21 LAB — CBC WITH DIFFERENTIAL/PLATELET
Basophils Absolute: 0.1 10*3/uL (ref 0.0–0.2)
Basos: 1 %
EOS (ABSOLUTE): 0.2 10*3/uL (ref 0.0–0.4)
Eos: 4 %
Hematocrit: 36.8 % (ref 34.0–46.6)
Hemoglobin: 12.1 g/dL (ref 11.1–15.9)
Immature Grans (Abs): 0 10*3/uL (ref 0.0–0.1)
Immature Granulocytes: 0 %
Lymphocytes Absolute: 2.9 10*3/uL (ref 0.7–3.1)
Lymphs: 53 %
MCH: 27.3 pg (ref 26.6–33.0)
MCHC: 32.9 g/dL (ref 31.5–35.7)
MCV: 83 fL (ref 79–97)
Monocytes Absolute: 0.4 10*3/uL (ref 0.1–0.9)
Monocytes: 7 %
Neutrophils Absolute: 1.9 10*3/uL (ref 1.4–7.0)
Neutrophils: 35 %
Platelets: 277 10*3/uL (ref 150–450)
RBC: 4.44 x10E6/uL (ref 3.77–5.28)
RDW: 10.7 % — ABNORMAL LOW (ref 11.7–15.4)
WBC: 5.5 10*3/uL (ref 3.4–10.8)

## 2022-05-21 LAB — TSH+FREE T4
Free T4: 1.47 ng/dL (ref 0.82–1.77)
TSH: 1.03 u[IU]/mL (ref 0.450–4.500)

## 2022-05-21 LAB — LIPID PANEL
Chol/HDL Ratio: 2.1 ratio (ref 0.0–4.4)
Cholesterol, Total: 123 mg/dL (ref 100–199)
HDL: 60 mg/dL (ref >39)
LDL Chol Calc (NIH): 45 mg/dL (ref 0–99)
Triglycerides: 94 mg/dL (ref 0–149)
VLDL Cholesterol Cal: 18 mg/dL (ref 5–40)

## 2022-05-21 LAB — HEMOGLOBIN A1C
Est. average glucose Bld gHb Est-mCnc: 292 mg/dL
Hgb A1c MFr Bld: 11.8 % — ABNORMAL HIGH (ref 4.8–5.6)

## 2022-05-21 LAB — VITAMIN D 25 HYDROXY (VIT D DEFICIENCY, FRACTURES): Vit D, 25-Hydroxy: 41.8 ng/mL (ref 30.0–100.0)

## 2022-05-22 ENCOUNTER — Ambulatory Visit (INDEPENDENT_AMBULATORY_CARE_PROVIDER_SITE_OTHER): Payer: BC Managed Care – PPO | Admitting: Gastroenterology

## 2022-05-22 ENCOUNTER — Other Ambulatory Visit (INDEPENDENT_AMBULATORY_CARE_PROVIDER_SITE_OTHER): Payer: Self-pay | Admitting: Gastroenterology

## 2022-05-22 ENCOUNTER — Other Ambulatory Visit: Payer: Self-pay | Admitting: Family Medicine

## 2022-05-22 DIAGNOSIS — R112 Nausea with vomiting, unspecified: Secondary | ICD-10-CM

## 2022-05-22 DIAGNOSIS — E1165 Type 2 diabetes mellitus with hyperglycemia: Secondary | ICD-10-CM

## 2022-05-23 ENCOUNTER — Telehealth (HOSPITAL_COMMUNITY): Payer: BC Managed Care – PPO | Admitting: Psychiatry

## 2022-05-29 ENCOUNTER — Encounter: Payer: Self-pay | Admitting: Orthopaedic Surgery

## 2022-06-02 ENCOUNTER — Other Ambulatory Visit: Payer: Self-pay | Admitting: Family Medicine

## 2022-06-02 DIAGNOSIS — E1165 Type 2 diabetes mellitus with hyperglycemia: Secondary | ICD-10-CM

## 2022-06-02 MED ORDER — TRULICITY 3 MG/0.5ML ~~LOC~~ SOAJ: 3.0000 mg | SUBCUTANEOUS | 2 refills | Status: DC

## 2022-06-02 NOTE — Progress Notes (Signed)
Please inform the patient that her hemoglobin A1c has increased from 11.2 to 11.8.  I have sent a refill for Trulicity 3 mg subcu injection weekly to her pharmacy to take.  Please encourage the patient to follow-up with her endocrinologist and to decrease her intake of high sugar foods with increased physical activities.  Her kidneys, liver, and thyroid levels are stable

## 2022-06-13 ENCOUNTER — Other Ambulatory Visit: Payer: Self-pay | Admitting: Nurse Practitioner

## 2022-06-13 ENCOUNTER — Other Ambulatory Visit: Payer: Self-pay | Admitting: Family Medicine

## 2022-06-13 ENCOUNTER — Other Ambulatory Visit (INDEPENDENT_AMBULATORY_CARE_PROVIDER_SITE_OTHER): Payer: Self-pay | Admitting: Gastroenterology

## 2022-06-13 DIAGNOSIS — R112 Nausea with vomiting, unspecified: Secondary | ICD-10-CM

## 2022-06-13 DIAGNOSIS — E1165 Type 2 diabetes mellitus with hyperglycemia: Secondary | ICD-10-CM

## 2022-06-13 NOTE — Telephone Encounter (Signed)
Medication was refilled on 06/13/2022

## 2022-06-16 ENCOUNTER — Other Ambulatory Visit: Payer: Self-pay | Admitting: Family Medicine

## 2022-06-16 DIAGNOSIS — E1165 Type 2 diabetes mellitus with hyperglycemia: Secondary | ICD-10-CM

## 2022-06-16 DIAGNOSIS — R112 Nausea with vomiting, unspecified: Secondary | ICD-10-CM

## 2022-06-16 DIAGNOSIS — I10 Essential (primary) hypertension: Secondary | ICD-10-CM

## 2022-06-16 MED ORDER — METOCLOPRAMIDE HCL 5 MG PO TABS: 5.0000 mg | ORAL_TABLET | Freq: Four times a day (QID) | ORAL | 0 refills | Status: DC

## 2022-06-16 MED ORDER — GLIPIZIDE 10 MG PO TABS: 10.0000 mg | ORAL_TABLET | Freq: Two times a day (BID) | ORAL | 3 refills | Status: DC

## 2022-06-16 MED ORDER — LOSARTAN POTASSIUM 100 MG PO TABS: 100.0000 mg | ORAL_TABLET | Freq: Every day | ORAL | 2 refills | Status: DC

## 2022-06-16 NOTE — Telephone Encounter (Signed)
Rx sent 

## 2022-06-20 ENCOUNTER — Telehealth: Payer: Self-pay | Admitting: Family Medicine

## 2022-06-20 NOTE — Telephone Encounter (Signed)
Pt called wanting to speak to nurse in regards to her Dexcom?

## 2022-06-23 ENCOUNTER — Encounter: Payer: Self-pay | Admitting: Internal Medicine

## 2022-06-23 ENCOUNTER — Ambulatory Visit (INDEPENDENT_AMBULATORY_CARE_PROVIDER_SITE_OTHER): Payer: BC Managed Care – PPO | Admitting: Internal Medicine

## 2022-06-23 VITALS — BP 119/81 | HR 92 | Ht 60.0 in | Wt 159.1 lb

## 2022-06-23 DIAGNOSIS — K589 Irritable bowel syndrome without diarrhea: Secondary | ICD-10-CM

## 2022-06-23 DIAGNOSIS — Z794 Long term (current) use of insulin: Secondary | ICD-10-CM | POA: Diagnosis not present

## 2022-06-23 DIAGNOSIS — E1165 Type 2 diabetes mellitus with hyperglycemia: Secondary | ICD-10-CM

## 2022-06-23 DIAGNOSIS — M5416 Radiculopathy, lumbar region: Secondary | ICD-10-CM

## 2022-06-23 MED ORDER — DEXCOM G7 SENSOR MISC: 3 refills | Status: DC

## 2022-06-23 MED ORDER — LINZESS 290 MCG PO CAPS: 290.0000 ug | ORAL_CAPSULE | Freq: Every day | ORAL | 1 refills | Status: DC

## 2022-06-23 NOTE — Assessment & Plan Note (Signed)
Refilled - LINZESS 290 MCG CAPS capsule; Take 1 capsule (290 mcg total) by mouth daily.  Dispense: 30 capsule; Refill: 1

## 2022-06-23 NOTE — Patient Instructions (Signed)
Thank you, [name] for allowing [place] to provide your care today.   Refilled sensors for Dexcom and Linzess for IBS  Referrals ordered today:    Referral Orders         Ambulatory referral to Physical Therapy      For back pain you can continue current medications of Cymbalta and Gabapentin. When pain worsens try tylenol 1000 mg. Physical therapy should help with this chronic pain. If not improving I recommend following up with your spine specialist.      Tamsen Snider, M.D.

## 2022-06-23 NOTE — Telephone Encounter (Signed)
Pt came in for an office appt, medications were filled.

## 2022-06-23 NOTE — Assessment & Plan Note (Signed)
>>  ASSESSMENT AND PLAN FOR UNCONTROLLED DIABETES MELLITUS WITH HYPERGLYCEMIA, WITH LONG-TERM CURRENT USE OF INSULIN  (HCC) WRITTEN ON 06/23/2022 12:01 PM BY GOLDA LYNWOOD PARAS, MD   Review of hemoglobin A1c shows it is uncontrolled and she has complications of polyneuropathy. She needs refill on dexcom sensors. The patient is currently using Continuous Glucose Monitoring. The patient is injecting insulin  one time a day and is currently uncontrolled.  The patient has follow up with endocrinologist for adjustments to her diabetes regimen based on glucose readings. - Continuous Blood Gluc Sensor (DEXCOM G7 SENSOR) MISC; Used to monitor blood glucose DX E11.65  Dispense: 1 each; Refill: 3

## 2022-06-23 NOTE — Assessment & Plan Note (Signed)
Patient has history of treatment for lumbar radiculopathy including facet injections and right microdiskectomy at L4-L5 in 2022. She has been followed and treated at [facility]. She did not improve with these treatment and EMG was conducted. Review of notes show EMG confirmed peripheral polyneuropathy. Her T2DM is uncontrolled. She need Dexcom sensors and will be following with her endocrinologist this month.   Assessment/Plan: Chronic illness with exacerbation  - For back pain you can continue current medications of Cymbalta and Gabapentin. When pain worsens try tylenol 1000 mg. Physical therapy should help with this chronic pain. If not improving I recommend following up with your spine specialist.  - Referral placed for PT

## 2022-06-23 NOTE — Progress Notes (Signed)
   HPI:[name] is a 52 y.o. female who presents for evaluation of back pain. For the details of today's visit, please refer to the assessment and plan.  Physical Exam: Vitals:   06/23/22 0848  BP: 119/81  Pulse: 92  SpO2: 97%  Weight: 159 lb 1.3 oz (72.2 kg)  Height: 5' (1.524 m)     Physical Exam Constitutional:      General: She is not in acute distress.    Appearance: She is not ill-appearing.   No gross deformity, scoliosis. No midline or bony TTP. FROM. Strength LEs 5/5 all muscle groups.   patellar and achilles tendons, equal bilaterally. Negative SLRs. Sensation intact to light touch bilaterally.    Assessment & Plan:   Lumbar radiculopathy Patient has history of treatment for lumbar radiculopathy including facet injections and right microdiskectomy at L4-L5 in 2022. She has been followed and treated at [facility]. She did not improve with these treatment and EMG was conducted. Review of notes show EMG confirmed peripheral polyneuropathy. Her T2DM is uncontrolled. She need Dexcom sensors and will be following with her endocrinologist this month.   Assessment/Plan: Chronic illness with exacerbation  - For back pain you can continue current medications of Cymbalta and Gabapentin. When pain worsens try tylenol 1000 mg. Physical therapy should help with this chronic pain. If not improving I recommend following up with your spine specialist.  - Referral placed for PT   IBS (irritable bowel syndrome) Refilled - LINZESS 290 MCG CAPS capsule; Take 1 capsule (290 mcg total) by mouth daily.  Dispense: 30 capsule; Refill: 1   Uncontrolled diabetes mellitus with hyperglycemia, with long-term current use of insulin (HCC)  Review of hemoglobin A1c shows it is uncontrolled and she has complications of polyneuropathy. She needs refill on dexcom sensors. The patient is currently using Continuous Glucose Monitoring. The patient is injecting insulin one time a  day and is currently uncontrolled.  The patient has follow up with endocrinologist for adjustments to her diabetes regimen based on glucose readings. - Continuous Blood Gluc Sensor (DEXCOM G7 SENSOR) MISC; Used to monitor blood glucose DX E11.65  Dispense: 1 each; Refill: 3     Lorene Dy, MD

## 2022-06-23 NOTE — Assessment & Plan Note (Addendum)
Review of hemoglobin A1c shows it is uncontrolled and she has complications of polyneuropathy. She needs refill on dexcom sensors. The patient is currently using Continuous Glucose Monitoring. The patient is injecting insulin one time a day and is currently uncontrolled.  The patient has follow up with endocrinologist for adjustments to her diabetes regimen based on glucose readings. - Continuous Blood Gluc Sensor (DEXCOM G7 SENSOR) MISC; Used to monitor blood glucose DX E11.65  Dispense: 1 each; Refill: 3

## 2022-07-04 ENCOUNTER — Other Ambulatory Visit (INDEPENDENT_AMBULATORY_CARE_PROVIDER_SITE_OTHER): Payer: Self-pay | Admitting: Gastroenterology

## 2022-07-04 ENCOUNTER — Other Ambulatory Visit: Payer: Self-pay | Admitting: Family Medicine

## 2022-07-04 DIAGNOSIS — R112 Nausea with vomiting, unspecified: Secondary | ICD-10-CM

## 2022-07-07 ENCOUNTER — Ambulatory Visit (HOSPITAL_COMMUNITY): Payer: BC Managed Care – PPO | Admitting: Psychiatry

## 2022-07-10 ENCOUNTER — Ambulatory Visit (INDEPENDENT_AMBULATORY_CARE_PROVIDER_SITE_OTHER): Payer: BC Managed Care – PPO | Admitting: Gastroenterology

## 2022-07-17 NOTE — Therapy (Signed)
OUTPATIENT PHYSICAL THERAPY THORACOLUMBAR EVALUATION   Patient Name: Kathryn [name] MRN: [id number] DOB:1971/02/01, 52 y.o., female Today's Date: 07/18/2022  END OF SESSION:  PT End of Session - 07/18/22 0821     Visit Number 1    Number of Visits 8    Date for PT Re-Evaluation 08/15/22    Authorization Type [employer] PPO    Authorization Time Period no auth, no co ins; 60 visit limit; $40.00 copay    Authorization - Visit Number 1    Authorization - Number of Visits 69    PT Start Time 0820    PT Stop Time 0900    PT Time Calculation (min) 40 min    Activity Tolerance Patient tolerated treatment well    Behavior During Therapy WFL for tasks assessed/performed             Past Medical History:  Diagnosis Date   Anemia    Anxiety    Asthma    Depression    GERD (gastroesophageal reflux disease)    HLD (hyperlipidemia) 04/07/2019   Hypertension    Hypothyroidism, adult 04/07/2019   Neuropathy    Obesity (BMI 30.0-34.9) 04/07/2019   Schizoaffective disorder ([place])    Type II diabetes mellitus, uncontrolled 04/27/2019   Past Surgical History:  Procedure Laterality Date   BACK SURGERY  09/06/2020   BIOPSY  05/03/2021   Procedure: BIOPSY;  Surgeon: [name], MD;  [facility];  Service: Gastroenterology;;   BREAST SURGERY Right    biopsy   CESAREAN SECTION     CHOLECYSTECTOMY  06/02/2012   Procedure: LAPAROSCOPIC CHOLECYSTECTOMY;  Surgeon: Jamesetta So, MD;  Location: [facility];  Service: General;  Laterality: N/A;  Attempted laparoscopic cholecystectomy   CHOLECYSTECTOMY  06/02/2012   Procedure: CHOLECYSTECTOMY;  Surgeon: Jamesetta So, MD;  Location: [facility];  Service: General;  Laterality: N/A;  converted to open at  [id number]   COLONOSCOPY WITH PROPOFOL N/A 07/18/2020   prep was fair, one 62m polyp (inflammatory) stool in descending colon, transverse and ascending, distal rectum and anal verge normal   ESOPHAGEAL DILATION  12/31/2018    Procedure: ESOPHAGEAL DILATION;  Surgeon: RRogene Houston MD;  [facility];  Service: Endoscopy;;   ESOPHAGOGASTRODUODENOSCOPY (EGD) WITH PROPOFOL N/A 08/24/2015   Procedure: ESOPHAGOGASTRODUODENOSCOPY (EGD) WITH PROPOFOL;  Surgeon: NRogene Houston MD;  [facility];  Service: Endoscopy;  Laterality: N/A;  1:10 - Ann to notify pt to arrive at 11:30   ESOPHAGOGASTRODUODENOSCOPY (EGD) WITH PROPOFOL N/A 12/31/2018   normal, no abnormality to explain dysphagia, esophagus dilated.   ESOPHAGOGASTRODUODENOSCOPY (EGD) WITH PROPOFOL N/A 05/03/2021   Procedure: ESOPHAGOGASTRODUODENOSCOPY (EGD) WITH PROPOFOL;  Surgeon: CHarvel Quale MD;  [facility];  Service: Gastroenterology;  Laterality: N/A;  1:20   FLEXIBLE SIGMOIDOSCOPY  07/17/2020   Procedure: FLEXIBLE SIGMOIDOSCOPY;  Surgeon: CHarvel Quale MD;  [facility];  Service: Gastroenterology;;   POLYPECTOMY  07/18/2020   Procedure: POLYPECTOMY INTESTINAL;  Surgeon: CHarvel Quale MD;  [facility];  Service: Gastroenterology;;  ascending colon polyp;    SAVORY DILATION  05/03/2021   Procedure: SAVORY DILATION;  Surgeon: CHarvel Quale MD;  [facility];  Service: Gastroenterology;;   tooth removal  2019   all teeth removed   TUBAL LIGATION     X3   Patient Active Problem List   Diagnosis Date Noted   Trigger finger 03/05/2022   Encounter  for general adult medical examination with abnormal findings 03/05/2022   Nausea 01/08/2022   Wheezing 01/08/2022   Migraine 10/31/2021   Depression, recurrent ([place]) 10/31/2021   Great toe pain, right 10/03/2021   Nausea and vomiting 09/10/2021   Chest pain 08/29/2021   Chronic tension-type headache, not intractable 08/29/2021   Encounter for examination following treatment at hospital 07/30/2021   Uncontrolled type 2 diabetes mellitus with hyperglycemia ([place]) 07/30/2021   Polyneuropathy  07/17/2021   Anxiety 07/01/2021   Diabetic autonomic neuropathy associated with type 2 diabetes mellitus ([place]) 07/01/2021   Need for immunization against influenza 07/01/2021   Need for varicella vaccine 07/01/2021   Melena 04/23/2021   Constipation 04/23/2021   Pain of upper abdomen 04/23/2021   Peripheral polyneuropathy 03/28/2021   Body mass index (BMI) 28.0-28.9, adult 03/04/2021   Body mass index (BMI) 26.0-26.9, adult 01/22/2021   Lumbar radiculopathy 12/13/2020   Essential (primary) hypertension 10/01/2020   Body mass index (BMI) 30.0-30.9, adult 10/01/2020   Moderate persistent asthma 09/28/2020   Body mass index (BMI) 29.0-29.9, adult 08/27/2020   Disc displacement, lumbar 08/27/2020   Elevated blood-pressure reading, without diagnosis of hypertension 08/27/2020   Non-adherence to medical treatment 08/02/2020   Functional dyspepsia 06/07/2020   Vitamin D deficiency 07/15/2019   Uncontrolled diabetes mellitus with hyperglycemia, with long-term current use of insulin ([place]) 04/27/2019   Hypothyroidism 04/07/2019   Obesity (BMI 30.0-34.9) 04/07/2019   Mixed hyperlipidemia 04/07/2019   Abnormal esophagram 11/23/2018   Chronic GERD 10/08/2015   IBS (irritable bowel syndrome) 10/08/2015   Essential hypertension, benign 08/22/2015   Schizoaffective disorder ([place]) 02/08/2015   PTSD (post-traumatic stress disorder) 02/08/2015    PCP: Alvira Monday FNP  REFERRING PROVIDER: Lyndal Pulley, [facility]  REFERRING DIAG: M54.16 (ICD-10-CM) - Lumbar radiculopathy  Rationale for Evaluation and Treatment: Rehabilitation  THERAPY DIAG:  Radiculopathy, lumbar region - Plan: PT plan of care cert/re-cert  Low back pain, unspecified back pain laterality, unspecified chronicity, unspecified whether sciatica present - Plan: PT plan of care cert/re-cert  ONSET DATE: 3 or 4 weeks  SUBJECTIVE:                                                                                                                                                                                            SUBJECTIVE STATEMENT: Pain the last 3 or 4 weeks; when standing her right leg; thigh starts going numb to knee; increased pain with laying on the right side; no pain with sitting; works 8 hour days; starts bothering her about hour 4  PERTINENT HISTORY:  2022; mass on L4-L5 removed and disc there  per Dr. Christella Noa Several abdominal procredures see above Teeth pulled recently; last Tuesday some swelling noted bilateral jawline  PAIN:  Are you having pain? Yes: NPRS scale: 0-10/10 Pain location: right side low back and thigh down to right knee Pain description: numb, burning and stabbing Aggravating factors: standing, laying on the right side Relieving factors: sitting  PRECAUTIONS: None  WEIGHT BEARING RESTRICTIONS: No  FALLS:  Has patient fallen in last 6 months? No    OCCUPATION: lots of standing and walking and bending; 8 hours shifts  PLOF: Independent  PATIENT GOALS: be able to stand without my back and leg hurting  NEXT MD VISIT: will make when done with therapy  OBJECTIVE:   DIAGNOSTIC FINDINGS:  CLINICAL DATA:  Low back and right leg pain.  Prior lumbar surgery.   EXAM: MRI LUMBAR SPINE WITHOUT AND WITH CONTRAST   TECHNIQUE: Multiplanar and multiecho pulse sequences of the lumbar spine were obtained without and with intravenous contrast.   CONTRAST:  7m GADAVIST GADOBUTROL 1 MMOL/ML IV SOLN   COMPARISON:  08/14/2020   FINDINGS: Segmentation: As on the prior study, the lowest fully formed intervertebral disc space is designated L5-S1. There is a rudimentary disc at S1-2.   Alignment:  Unchanged trace retrolisthesis of L4 on L5.   Vertebrae: No fracture or suspicious marrow lesion. Persistent mild degenerative endplate edema at L075-GRM Low level facet edema bilaterally at L4-5 as well.   Conus medullaris and cauda equina: Conus extends to the L2  level. Conus and cauda equina appear normal.   Paraspinal and other soft tissues: Postoperative changes in the posterior soft tissues at L4-5. Small postoperative fluid collection extending from the right laminectomy at L4-5 posteriorly along the spinous processes.   Disc levels:   T12-L1: Negative.   L1-2: Mild facet and ligamentum flavum hypertrophy without disc herniation or stenosis, unchanged.   L2-3: Disc desiccation. Trace disc bulging and mild facet and ligamentum flavum hypertrophy without stenosis, unchanged.   L3-4: Disc desiccation. Mild disc bulging and mild facet and ligamentum flavum hypertrophy without stenosis, unchanged.   L4-5: Interval right laminectomy and microdiscectomy. Enhancing postoperative granulation tissue in the right lateral recess and right paracentral ventral epidural space. No recurrent disc herniation, spinal stenosis, or compressive lateral recess stenosis. Disc bulging, endplate spurring, and mild facet hypertrophy result in mild bilateral neural foraminal stenosis.   L5-S1: Mild disc bulging and moderate to severe facet hypertrophy result in mild right and minimal left neural foraminal stenosis without spinal stenosis, unchanged.   IMPRESSION: 1. Interval surgery at L4-5 without evidence of recurrent disc herniation. 2. Unchanged mild neural foraminal stenosis at L4-5 and L5-S1.  CLINICAL DATA:  Low back pain with bilateral sciatica; technologist note states low back pain radiating to right hip   EXAM: MRI LUMBAR SPINE WITHOUT CONTRAST   TECHNIQUE: Multiplanar, multisequence MR imaging of the lumbar spine was performed. No intravenous contrast was administered.   COMPARISON:  None.   FINDINGS: Segmentation: For the purposes of this dictation, the most caudal fully formed disc is designated L5-S1.   Alignment:  No significant listhesis.   Vertebrae: Vertebral body heights are maintained. Minor degenerative endplate  irregularity and marrow edema at L4-L5. No suspicious osseous lesion.   Conus medullaris and cauda equina: Conus extends to the L2 level. Conus and cauda equina appear normal.   Paraspinal and other soft tissues: Unremarkable.   Disc levels:   L1-L2: Mild facet arthropathy with ligamentum flavum infolding. No canal or foraminal stenosis.  L2-L3: Mild facet arthropathy with ligamentum flavum infolding. No canal or foraminal stenosis.   L3-L4: Disc bulge with endplate osteophytic ridging. Mild facet arthropathy with ligamentum flavum infolding. No canal or foraminal stenosis.   L4-L5: Disc bulge with endplate osteophytic ridging and superimposed central/right subarticular extrusion extending below the disc level. Mild facet arthropathy with ligamentum flavum infolding. Minor canal stenosis. Effacement of the right subarticular recess with compression of traversing L5 nerve roots below disc level. Minor canal stenosis.   L5-S1: Disc bulge. Moderate to marked facet arthropathy. No canal stenosis. Mild right and minor left foraminal stenosis.   IMPRESSION: Multilevel degenerative changes as detailed above. Most notably at L4-L5, a disc extrusion compresses the traversing right L5 nerve roots.        PATIENT SURVEYS:  FOTO 47  COGNITION: Overall cognitive status: Within functional limits for tasks assessed     SENSATION: Altered sensation right thigh   POSTURE: rounded shoulders and forward head   LUMBAR ROM: extension more painful than flexion  AROM eval  Flexion 60% available*  Extension 50% available*  Right lateral flexion   Left lateral flexion   Right rotation   Left rotation    (Blank rows = not tested)  LOWER EXTREMITY ROM:     Active  Right eval Left eval  Hip flexion    Hip extension    Hip abduction    Hip adduction    Hip internal rotation    Hip external rotation    Knee flexion    Knee extension    Ankle dorsiflexion    Ankle  plantarflexion    Ankle inversion    Ankle eversion     (Blank rows = not tested)  LOWER EXTREMITY MMT:    MMT Right eval Left eval  Hip flexion 3-* 4+  Hip extension 4- 4  Hip abduction    Hip adduction    Hip internal rotation    Hip external rotation    Knee flexion 4+ 4+  Knee extension 4+ 5  Ankle dorsiflexion 5 5  Ankle plantarflexion    Ankle inversion    Ankle eversion     (Blank rows = not tested)   FUNCTIONAL TESTS:  5 times sit to stand: 20.43  GAIT: Distance walked: 50 ft Assistive device utilized: None Level of assistance: Modified independence Comments: slight decreased gait speed  TODAY'S TREATMENT:                                                                                                                              DATE: 07/18/22 physical therapy evaluation and HEP instruction    PATIENT EDUCATION:  Education details: Patient educated on exam findings, POC, scope of PT, HEP, and what to expect next visit. Person educated: Patient Education method: Explanation, Demonstration, and Handouts Education comprehension: verbalized understanding, returned demonstration, verbal cues required, and tactile cues required  HOME EXERCISE PROGRAM: Access Code: [email or web address] URL: [email or web address] Date: 07/18/2022 Prepared  by: [facility]  Exercises - Supine Lower Trunk Rotation  - 2 x daily - 7 x weekly - 3 sets - 10 reps - Supine Transversus Abdominis Bracing - Hands on Stomach  - 2 x daily - 7 x weekly - 1 sets - 10 reps - 5 sec hold - Supine March  - 1 x daily - 7 x weekly - 3 sets - 10 reps ASSESSMENT:  CLINICAL IMPRESSION: Patient is a 52 y.o. female who was seen today for physical therapy evaluation and treatment for lumbar radiculopathy. Patient  presents to physical therapy with complaint of right side low back pain that radiates into her right leg/ thigh. Patient demonstrates muscle weakness, reduced ROM, and fascial restrictions  which are likely contributing to symptoms of pain and are negatively impacting patient ability to perform ADLs and functional mobility tasks. Patient will benefit from skilled physical therapy services to address these deficits to reduce pain and improve level of function with ADLs and functional mobility tasks.   OBJECTIVE IMPAIRMENTS: Abnormal gait, decreased activity tolerance, decreased endurance, decreased knowledge of condition, decreased mobility, difficulty walking, decreased ROM, decreased strength, hypomobility, increased fascial restrictions, impaired perceived functional ability, impaired flexibility, and pain.   ACTIVITY LIMITATIONS: carrying, lifting, bending, sitting, standing, squatting, sleeping, stairs, transfers, and locomotion level  PARTICIPATION LIMITATIONS: meal prep, cleaning, laundry, shopping, community activity, and occupation    REHAB POTENTIAL: Good  CLINICAL DECISION MAKING: Stable/uncomplicated  EVALUATION COMPLEXITY: Low   GOALS: Goals reviewed with patient? No  SHORT TERM GOALS: Target date: 08/01/2022  patient will be independent with initial HEP  Baseline: Goal status: INITIAL  2.  Patient will self report 30% improvement to improve tolerance for functional activity  Baseline:  Goal status: INITIAL  LONG TERM GOALS: Target date: 08/15/2022  Patient will be independent in self management strategies to improve quality of life and functional outcomes.  Baseline:  Goal status: INITIAL  2.  Patient will self report 50% improvement to improve tolerance for functional activity  Baseline:  Goal status: INITIAL  3.  Patient will improve FOTO score to predicted value   Baseline:  Goal status: INITIAL  4.  Patient will increase  leg MMTs to 5/5 without pain to promote return to ambulation community distances with minimal deviation.  Baseline: see above Goal status: INITIAL  5.  Patient will be able to stand and walk an entire work shift (  8 hours) with pain only in the back and no higher than 3/10 to improve work tolerance. Baseline:  Goal status: INITIAL   PLAN:  PT FREQUENCY: 2x/week  PT DURATION: 4 weeks  PLANNED INTERVENTIONS: Therapeutic exercises, Therapeutic activity, Neuromuscular re-education, Balance training, Gait training, Patient/Family education, Joint manipulation, Joint mobilization, Stair training, Orthotic/Fit training, DME instructions, Aquatic Therapy, Dry Needling, Electrical stimulation, Spinal manipulation, Spinal mobilization, Cryotherapy, Moist heat, Compression bandaging, scar mobilization, Splintting, Taping, Traction, Ultrasound, Ionotophoresis '4mg'$ /ml Dexamethasone, and Manual therapy .  PLAN FOR NEXT SESSION: Review HEP and goals; progress core stability and strengthening as able   9:37 AM, 07/18/22 Joni Colegrove Small Cyris Maalouf MPT [facility] [place] [phone]

## 2022-07-18 ENCOUNTER — Other Ambulatory Visit: Payer: Self-pay | Admitting: Nurse Practitioner

## 2022-07-18 ENCOUNTER — Ambulatory Visit (HOSPITAL_COMMUNITY): Payer: BC Managed Care – PPO | Attending: Internal Medicine

## 2022-07-18 ENCOUNTER — Other Ambulatory Visit: Payer: Self-pay | Admitting: Family Medicine

## 2022-07-18 ENCOUNTER — Other Ambulatory Visit: Payer: Self-pay

## 2022-07-18 ENCOUNTER — Other Ambulatory Visit (HOSPITAL_COMMUNITY): Payer: Self-pay | Admitting: Psychiatry

## 2022-07-18 DIAGNOSIS — M5416 Radiculopathy, lumbar region: Secondary | ICD-10-CM | POA: Insufficient documentation

## 2022-07-18 DIAGNOSIS — I10 Essential (primary) hypertension: Secondary | ICD-10-CM

## 2022-07-18 DIAGNOSIS — M545 Low back pain, unspecified: Secondary | ICD-10-CM | POA: Insufficient documentation

## 2022-07-18 DIAGNOSIS — J4540 Moderate persistent asthma, uncomplicated: Secondary | ICD-10-CM

## 2022-07-21 ENCOUNTER — Other Ambulatory Visit: Payer: Self-pay | Admitting: Family Medicine

## 2022-07-21 DIAGNOSIS — I10 Essential (primary) hypertension: Secondary | ICD-10-CM

## 2022-07-21 MED ORDER — AMLODIPINE BESYLATE 5 MG PO TABS: 5.0000 mg | ORAL_TABLET | Freq: Every day | ORAL | 1 refills | Status: DC

## 2022-07-22 ENCOUNTER — Other Ambulatory Visit (INDEPENDENT_AMBULATORY_CARE_PROVIDER_SITE_OTHER): Payer: Self-pay | Admitting: Gastroenterology

## 2022-07-22 ENCOUNTER — Encounter (HOSPITAL_COMMUNITY): Payer: BC Managed Care – PPO | Admitting: Physical Therapy

## 2022-07-22 ENCOUNTER — Other Ambulatory Visit: Payer: Self-pay | Admitting: Family Medicine

## 2022-07-22 ENCOUNTER — Other Ambulatory Visit: Payer: Self-pay | Admitting: Psychiatry

## 2022-07-22 DIAGNOSIS — R112 Nausea with vomiting, unspecified: Secondary | ICD-10-CM

## 2022-07-23 NOTE — Telephone Encounter (Signed)
No longer on Pantoprazole per April 2023 ov.

## 2022-07-24 ENCOUNTER — Encounter: Payer: Self-pay | Admitting: Radiology

## 2022-07-24 ENCOUNTER — Encounter (HOSPITAL_COMMUNITY): Payer: BC Managed Care – PPO

## 2022-07-24 NOTE — Telephone Encounter (Signed)
Last seen on 10/08/2021 was told to follow up in 4 months, no follow up scheduled.

## 2022-07-25 ENCOUNTER — Telehealth (HOSPITAL_COMMUNITY): Payer: Self-pay

## 2022-07-25 NOTE — Telephone Encounter (Signed)
NS # 1; spoke with patient who states she fell asleep after coming home from work (works 3rd) and did not wake up in time to come to therapy appointment yesterday.  PT reminded patient of her next appointment Tuesday 3/5 and she states she plans to attend.    9:46 AM, 07/25/22 Latreshia Beauchaine Small Correy Weidner MPT [facility] [place] [phone]

## 2022-07-28 ENCOUNTER — Encounter (HOSPITAL_COMMUNITY): Payer: Self-pay | Admitting: Psychiatry

## 2022-07-28 ENCOUNTER — Telehealth (INDEPENDENT_AMBULATORY_CARE_PROVIDER_SITE_OTHER): Payer: BC Managed Care – PPO | Admitting: Psychiatry

## 2022-07-28 DIAGNOSIS — F4310 Post-traumatic stress disorder, unspecified: Secondary | ICD-10-CM

## 2022-07-28 DIAGNOSIS — J4540 Moderate persistent asthma, uncomplicated: Secondary | ICD-10-CM | POA: Diagnosis not present

## 2022-07-28 DIAGNOSIS — F251 Schizoaffective disorder, depressive type: Secondary | ICD-10-CM | POA: Diagnosis not present

## 2022-07-28 MED ORDER — RISPERIDONE 2 MG PO TABS: ORAL_TABLET | ORAL | 3 refills | Status: DC

## 2022-07-28 MED ORDER — CLONAZEPAM 0.5 MG PO TABS: 0.5000 mg | ORAL_TABLET | Freq: Two times a day (BID) | ORAL | 0 refills | Status: DC | PRN

## 2022-07-28 MED ORDER — BENZTROPINE MESYLATE 1 MG PO TABS: ORAL_TABLET | ORAL | 2 refills | Status: DC

## 2022-07-28 MED ORDER — DULOXETINE HCL 60 MG PO CPEP: 60.0000 mg | ORAL_CAPSULE | Freq: Two times a day (BID) | ORAL | 2 refills | Status: DC

## 2022-07-28 MED ORDER — TRAZODONE HCL 100 MG PO TABS: ORAL_TABLET | ORAL | 2 refills | Status: DC

## 2022-07-28 NOTE — Progress Notes (Signed)
Virtual Visit via Telephone Note  I connected with Kathryn [name] on 07/28/22 at  9:20 AM EST by telephone and verified that I am speaking with the correct person using two identifiers.  Location: Patient: home Provider: office   I discussed the limitations, risks, security and privacy concerns of performing an evaluation and management service by telephone and the availability of in person appointments. I also discussed with the patient that there may be a patient responsible charge related to this service. The patient expressed understanding and agreed to proceed.     I discussed the assessment and treatment plan with the patient. The patient was provided an opportunity to ask questions and all were answered. The patient agreed with the plan and demonstrated an understanding of the instructions.   The patient was advised to call back or seek an in-person evaluation if the symptoms worsen or if the condition fails to improve as anticipated.  I provided 15 minutes of non-face-to-face time during this encounter.   Levonne Spiller, MD  [facility] MD/PA/NP OP Progress Note  07/28/2022 9:44 AM Kathryn [name]  MRN:  [id number]  Chief Complaint:  Chief Complaint  Patient presents with   Schizophrenia   Depression   Anxiety   Follow-up   HPI: This patient is a 52 year old black female who lives with her husband in [place].  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  The patient returns for follow-up after about 8 months.  She states she has been working a lot and therefore missed some appointments.  She claims she has been compliant with her psychiatric medications.  However she says sometimes she still hears voices and one of the voices encourages her to "get violent."  She and her husband were "play fighting" 1 night and she began to feel this voices telling her to hurt her husband and she had her daughter come get her and talk to her.  She has  not actually hurt anybody and has no weapons at home.  She has been out of the clonazepam for a while and she thinks this may have something to do with it.  The patient denies significant depression or anxiety.  Again I urged her to get into therapy regarding management of these voices.  She is on a high dose of Risperdal.  And most when she is very functional at home and at work.  She is sleeping well. Visit Diagnosis:    ICD-10-CM   1. Schizoaffective disorder, depressive type ([place])  F25.1     2. Moderate persistent asthma, unspecified whether complicated  123456 DULoxetine (CYMBALTA) 60 MG capsule    3. PTSD (post-traumatic stress disorder)  F43.10       Past Psychiatric History: 2 prior hospitalizations for schizoaffective disorder  Past Medical History:  Past Medical History:  Diagnosis Date   Anemia    Anxiety    Asthma    Depression    GERD (gastroesophageal reflux disease)    HLD (hyperlipidemia) 04/07/2019   Hypertension    Hypothyroidism, adult 04/07/2019   Neuropathy    Obesity (BMI 30.0-34.9) 04/07/2019   Schizoaffective disorder ([place])    Type II diabetes mellitus, uncontrolled 04/27/2019    Past Surgical History:  Procedure Laterality Date   BACK SURGERY  09/06/2020   BIOPSY  05/03/2021   Procedure: BIOPSY;  Surgeon: [name], MD;  [facility];  Service: Gastroenterology;;   BREAST SURGERY Right  biopsy   CESAREAN SECTION     CHOLECYSTECTOMY  06/02/2012   Procedure: LAPAROSCOPIC CHOLECYSTECTOMY;  Surgeon: Jamesetta So, MD;  Location: [facility];  Service: General;  Laterality: N/A;  Attempted laparoscopic cholecystectomy   CHOLECYSTECTOMY  06/02/2012   Procedure: CHOLECYSTECTOMY;  Surgeon: Jamesetta So, MD;  Location: [facility];  Service: General;  Laterality: N/A;  converted to open at  [id number]   COLONOSCOPY WITH PROPOFOL N/A 07/18/2020   prep was fair, one 48m polyp (inflammatory) stool in descending colon, transverse and ascending,  distal rectum and anal verge normal   ESOPHAGEAL DILATION  12/31/2018   Procedure: ESOPHAGEAL DILATION;  Surgeon: RRogene Houston MD;  [facility];  Service: Endoscopy;;   ESOPHAGOGASTRODUODENOSCOPY (EGD) WITH PROPOFOL N/A 08/24/2015   Procedure: ESOPHAGOGASTRODUODENOSCOPY (EGD) WITH PROPOFOL;  Surgeon: NRogene Houston MD;  [facility];  Service: Endoscopy;  Laterality: N/A;  1:10 - Ann to notify pt to arrive at 11:30   ESOPHAGOGASTRODUODENOSCOPY (EGD) WITH PROPOFOL N/A 12/31/2018   normal, no abnormality to explain dysphagia, esophagus dilated.   ESOPHAGOGASTRODUODENOSCOPY (EGD) WITH PROPOFOL N/A 05/03/2021   Procedure: ESOPHAGOGASTRODUODENOSCOPY (EGD) WITH PROPOFOL;  Surgeon: CHarvel Quale MD;  [facility];  Service: Gastroenterology;  Laterality: N/A;  1:20   FLEXIBLE SIGMOIDOSCOPY  07/17/2020   Procedure: FLEXIBLE SIGMOIDOSCOPY;  Surgeon: CMontez Morita DQuillian Quince MD;  [facility];  Service: Gastroenterology;;   POLYPECTOMY  07/18/2020   Procedure: POLYPECTOMY INTESTINAL;  Surgeon: CMontez Morita DQuillian Quince MD;  [facility];  Service: Gastroenterology;;  ascending colon polyp;    SAVORY DILATION  05/03/2021   Procedure: SAVORY DILATION;  Surgeon: CHarvel Quale MD;  [facility];  Service: Gastroenterology;;   tooth removal  2019   all teeth removed   TUBAL LIGATION     X3    Family Psychiatric History: See below  Family History:  Family History  Problem Relation Age of Onset   Hypertension Mother    Alcohol abuse Mother    Heart disease Mother    Kidney disease Mother    Aneurysm Mother        brain   Hypertension Father    Alcohol abuse Sister    Alcohol abuse Maternal Grandmother    Alcohol abuse Maternal Grandfather    Hearing loss Daughter    Alcohol abuse Maternal Aunt    Alcohol abuse Paternal Aunt    Alcohol abuse Cousin    Stroke Other    Diabetes Other    Cancer  Other    Seizures Other     Social History:  Social History   Socioeconomic History   Marital status: Married    Spouse name: TPieter Partridge  Number of children: 3   Years of education: 12   Highest education level: Not on file  Occupational History    Comment: [employer]    Comment: 3rd shift  Tobacco Use   Smoking status: Never   Smokeless tobacco: Never  Vaping Use   Vaping Use: Never used  Substance and Sexual Activity   Alcohol use: No    Alcohol/week: 0.0 standard drinks of alcohol   Drug use: No   Sexual activity: Not Currently    Birth control/protection: Surgical  Other Topics Concern   Not on file  Social History Narrative   Married for 22 years.On disability secondary to schizophrenia.   Lives with husband.   Caffeine- decaf coffee, tea   Social Determinants of Health  Financial Resource Strain: Not on file  Food Insecurity: Not on file  Transportation Needs: Not on file  Physical Activity: Not on file  Stress: Not on file  Social Connections: Not on file    Allergies: No Known Allergies  Metabolic Disorder Labs: Lab Results  Component Value Date   HGBA1C 11.8 (H) 05/20/2022   MPG 332 07/30/2020   MPG  07/19/2019     Comment:     eAG cannot be calculated. Hemoglobin A1c result exceeds the linearity of the assay.    No results found for: "PROLACTIN" Lab Results  Component Value Date   CHOL 123 05/20/2022   TRIG 94 05/20/2022   HDL 60 05/20/2022   CHOLHDL 2.1 05/20/2022   LDLCALC 45 05/20/2022   LDLCALC 39 03/05/2022   Lab Results  Component Value Date   TSH 1.030 05/20/2022   TSH 1.000 03/05/2022    Therapeutic Level Labs: Lab Results  Component Value Date   LITHIUM <0.06 (L) 07/03/2016   LITHIUM 1.17 09/09/2015   No results found for: "VALPROATE" No results found for: "CBMZ"  Current Medications: Current Outpatient Medications  Medication Sig Dispense Refill   albuterol (VENTOLIN HFA) 108 (90 Base) MCG/ACT inhaler Inhale 2 puffs into  the lungs every 6 (six) hours as needed for wheezing or shortness of breath. 8 g 2   amLODipine (NORVASC) 5 MG tablet Take 1 tablet (5 mg total) by mouth daily. 90 tablet 1   amoxicillin (AMOXIL) 500 MG capsule Take by mouth.     aspirin EC 81 MG tablet Take 81 mg by mouth daily.     atorvastatin (LIPITOR) 10 MG tablet TAKE 1 TABLET BY MOUTH EVERYDAY AT BEDTIME 90 tablet 1   benztropine (COGENTIN) 1 MG tablet TAKE 1 TABLET(1 MG) BY MOUTH AT BEDTIME 90 tablet 2   blood glucose meter kit and supplies Dispense based on patient and insurance preference. Once daily testing DX E11.9 1 each 0   Blood Glucose Monitoring Suppl (ACCU-CHEK GUIDE ME) w/Device KIT 1 Piece by Does not apply route as directed. 1 kit 0   budesonide-formoterol (SYMBICORT) 80-4.5 MCG/ACT inhaler INHALE 2 PUFFS INTO THE LUNGS TWICE A DAY 10.2 each 3   carvedilol (COREG) 12.5 MG tablet Take 1 tablet (12.5 mg total) by mouth 2 (two) times daily. 180 tablet 0   Cholecalciferol (VITAMIN D-3) 125 MCG (5000 UT) TABS Take 2 tablets by mouth daily. 30 tablet 1   clonazePAM (KLONOPIN) 0.5 MG tablet Take 1 tablet (0.5 mg total) by mouth 2 (two) times daily as needed for anxiety. 180 tablet 0   Continuous Blood Gluc Receiver (DEXCOM G7 RECEIVER) DEVI USED TO MONITOR BLOOD GLUCOSE DX: E11.65 1 each 0   Continuous Blood Gluc Sensor (DEXCOM G7 SENSOR) MISC Used to monitor blood glucose DX E11.65 1 each 3   Dulaglutide (TRULICITY) 3 0000000 SOPN Inject 3 mg as directed once a week. 2 mL 2   DULoxetine (CYMBALTA) 60 MG capsule Take 1 capsule (60 mg total) by mouth 2 (two) times daily. 180 capsule 2   gabapentin (NEURONTIN) 300 MG capsule Take 1 capsule (300 mg total) by mouth daily. 90 capsule 1   glipiZIDE (GLUCOTROL) 10 MG tablet Take 1 tablet (10 mg total) by mouth 2 (two) times daily before a meal. 60 tablet 3   glucose blood (ACCU-CHEK GUIDE) test strip Use as instructed 150 each 2   insulin glargine, 2 Unit Dial, (TOUJEO MAX SOLOSTAR) 300  UNIT/ML Solostar Pen Inject 80 Units  into the skin at bedtime. 9 mL 2   levothyroxine (SYNTHROID) 75 MCG tablet Take 75 mcg by mouth daily before breakfast.     LINZESS 290 MCG CAPS capsule Take 1 capsule (290 mcg total) by mouth daily. 30 capsule 1   losartan (COZAAR) 100 MG tablet Take 1 tablet (100 mg total) by mouth daily. 30 tablet 2   metoCLOPramide (REGLAN) 5 MG tablet TAKE 1 TABLET BY MOUTH 4 TIMES DAILY. 120 tablet 0   Multiple Vitamin (MULTIVITAMIN) tablet Take 1 tablet by mouth daily.     omeprazole (PRILOSEC) 40 MG capsule TAKE 1 CAPSULE BY MOUTH TWICE A DAY 60 capsule 3   ondansetron (ZOFRAN) 4 MG tablet Take 1 tablet (4 mg total) by mouth every 8 (eight) hours as needed for nausea or vomiting. 30 tablet 3   pantoprazole (PROTONIX) 40 MG tablet Take 40 mg by mouth daily.     Polyvinyl Alcohol-Povidone PF (REFRESH) 1.4-0.6 % SOLN Place 1 drop into both eyes daily as needed (dry eyes).     risperiDONE (RISPERDAL) 2 MG tablet TAKE 1 TABLET EVERY MORNING AND 3 TABLETS AT BEDTIME 360 tablet 3   rizatriptan (MAXALT) 10 MG tablet Take 1 tablet (10 mg total) by mouth as needed for migraine. May repeat in 2 hours if needed 10 tablet 3   sucralfate (CARAFATE) 1 GM/10ML suspension Take 10 mLs (1 g total) by mouth 4 (four) times daily -  with meals and at bedtime. 420 mL 1   SUMAtriptan (IMITREX) 50 MG tablet TAKE 1 TABLET BY MOUTH EVERY 2 HOURS AS NEEDED FOR MIGRAINE. MAY REPEAT IN 2 HOURS IF HEADACHE PERSISTS OR RECURS. 3 tablet 3   topiramate (TOPAMAX) 25 MG tablet TAKE 1 TAB AT BEDTIME FOR ONE WEEK, THEN INCREASE TO 2 TABS AT BEDTIME FOR ONE WEEK, THEN 3 TABS AT BEDTIME FOR ONE WEEK, THEN TAKE 4 TABS ('100MG'$ ) AT BEDTIME 240 tablet 0   traZODone (DESYREL) 100 MG tablet TAKE 2 TABLETS(200 MG) BY MOUTH AT BEDTIME 180 tablet 2   No current facility-administered medications for this visit.     Musculoskeletal: Strength & Muscle Tone: na Gait & Station: na Patient leans: N/A  Psychiatric  Specialty Exam: Review of Systems  Psychiatric/Behavioral:  Positive for hallucinations. The patient is nervous/anxious.   All other systems reviewed and are negative.   Last menstrual period 01/23/2013.There is no height or weight on file to calculate BMI.  General Appearance: NA  Eye Contact:  NA  Speech:  Clear and Coherent  Volume:  Normal  Mood:  Anxious  Affect:  NA  Thought Process:  Goal Directed  Orientation:  Full (Time, Place, and Person)  Thought Content: Rumination and occasional auditory hallucinations sometimes telling her to hurt others.  So far she has not acted on these and does not plan to  Suicidal Thoughts:  No  Homicidal Thoughts:  Yes.  without intent/plan  Memory:  Immediate;   Good Recent;   Fair Remote;   NA  Judgement:  Fair  Insight:  Shallow  Psychomotor Activity:  Normal  Concentration:  Concentration: Good and Attention Span: Good  Recall:  [employer] of Knowledge: Fair  Language: Good  Akathisia:  NA  Handed:  Right  AIMS (if indicated): not done  Assets:  Communication Skills Desire for Improvement Resilience Social Support  ADL's:  Intact  Cognition: WNL  Sleep:  Good   Screenings: GAD-7    Flowsheet Row Office Visit from 06/23/2022 in [facility] Office Visit from 05/20/2022 in [facility] Office Visit from 03/05/2022 in [facility]  Total GAD-7 Score '3 19 21      '$ PHQ2-9    [place] Office Visit from 06/23/2022 in [facility] Office Visit from 05/20/2022 in [facility] Office Visit from 03/19/2022 in [facility] Office Visit from 03/05/2022 in [facility] Office Visit from 01/28/2022 in [facility]  PHQ-2 Total Score 0 '3 1 6 '$ 0  PHQ-9 Total Score 2 17 -- 26 --      Flowsheet Row Video Visit from 08/16/2021 in [place] [facility] [place] [facility] from 07/17/2021 in [facility] Video Visit from 06/19/2021 in [place] [facility] [place] No Risk No Risk No Risk        Assessment and Plan: This patient is a 52 year old female with a history of schizoaffective disorder and PTSD.  She still does not have occasional voices sometimes telling her to become violent but she usually deals with this by banging pots and pans together.  I strongly suggested that she get into therapy to help deal with this.  For now she will continue clonazepam 0.5 mg twice daily for anxiety, respite all 2 mg in the morning and 6 mg at evening for schizophrenic symptoms, Cogentin 1 mg to prevent side effects from respite all, Cymbalta 60 mg twice daily for depression and trazodone 200 mg at bedtime for sleep.  She will return to see me in 2 months  Collaboration of Care: Collaboration of Care: Referral or follow-up with counselor/therapist AEB patient will be referred to a counselor in our office  Patient/Guardian was advised Release of Information must be obtained prior to any record release in order to collaborate their care with an outside provider. Patient/Guardian was advised if they have not already done so to contact the registration department to sign all necessary forms in order for [place] to release information regarding their care.   Consent: Patient/Guardian gives verbal consent for treatment and assignment of benefits for services provided during this visit. Patient/Guardian expressed understanding and agreed to proceed.    Levonne Spiller, MD 07/28/2022, 9:44 AM

## 2022-07-29 ENCOUNTER — Encounter (HOSPITAL_COMMUNITY): Payer: BC Managed Care – PPO

## 2022-07-31 ENCOUNTER — Telehealth (HOSPITAL_COMMUNITY): Payer: Self-pay | Admitting: Physical Therapy

## 2022-07-31 ENCOUNTER — Encounter (HOSPITAL_COMMUNITY): Payer: BC Managed Care – PPO | Admitting: Physical Therapy

## 2022-07-31 ENCOUNTER — Encounter (HOSPITAL_COMMUNITY): Payer: Self-pay

## 2022-07-31 NOTE — Therapy (Signed)
PHYSICAL THERAPY DISCHARGE SUMMARY  Visits from Start of Care: 1  Current functional level related to goals / functional outcomes: See below   Remaining deficits: See below   Education / Equipment: See below   Patient agrees to discharge. Patient goals were not met. Patient is being discharged due to not returning since the last visit.  8:39 AM, 07/31/22 Kathryn [name] MPT [facility] [place] [phone]

## 2022-07-31 NOTE — Telephone Encounter (Signed)
Pt did not show for appt. Today is 3rd NS (3/1, 3/5, and today).  Called and left voicemail regarding NS policy and currently discharged from therapy.  All remaining appts removed and Discharge summary written.  Teena Irani, PTA/CLT [place] Ph: [phone]

## 2022-08-04 ENCOUNTER — Encounter (HOSPITAL_COMMUNITY): Payer: BC Managed Care – PPO

## 2022-08-06 ENCOUNTER — Encounter (HOSPITAL_COMMUNITY): Payer: BC Managed Care – PPO

## 2022-08-07 ENCOUNTER — Other Ambulatory Visit (INDEPENDENT_AMBULATORY_CARE_PROVIDER_SITE_OTHER): Payer: Self-pay | Admitting: Gastroenterology

## 2022-08-07 ENCOUNTER — Encounter (INDEPENDENT_AMBULATORY_CARE_PROVIDER_SITE_OTHER): Payer: Self-pay | Admitting: Gastroenterology

## 2022-08-07 ENCOUNTER — Ambulatory Visit (INDEPENDENT_AMBULATORY_CARE_PROVIDER_SITE_OTHER): Payer: BC Managed Care – PPO | Admitting: Gastroenterology

## 2022-08-07 VITALS — BP 111/78 | HR 76 | Temp 98.3°F | Ht 60.0 in | Wt 153.3 lb

## 2022-08-07 DIAGNOSIS — R1319 Other dysphagia: Secondary | ICD-10-CM | POA: Diagnosis not present

## 2022-08-07 DIAGNOSIS — K3184 Gastroparesis: Secondary | ICD-10-CM

## 2022-08-07 DIAGNOSIS — K219 Gastro-esophageal reflux disease without esophagitis: Secondary | ICD-10-CM

## 2022-08-07 DIAGNOSIS — K589 Irritable bowel syndrome without diarrhea: Secondary | ICD-10-CM | POA: Diagnosis not present

## 2022-08-07 DIAGNOSIS — R1310 Dysphagia, unspecified: Secondary | ICD-10-CM | POA: Insufficient documentation

## 2022-08-07 MED ORDER — LINZESS 290 MCG PO CAPS: 290.0000 ug | ORAL_CAPSULE | Freq: Every day | ORAL | 1 refills | Status: DC

## 2022-08-07 MED ORDER — OMEPRAZOLE 40 MG PO CPDR: 40.0000 mg | DELAYED_RELEASE_CAPSULE | Freq: Two times a day (BID) | ORAL | 3 refills | Status: DC

## 2022-08-07 NOTE — Patient Instructions (Signed)
Continue Linzess 290 mcg daily Stop Benefiber. If constipation worsens, start Miralax 1-3 capfuls every day Continue Reglan 5 mg with every meal Continue omeprazole 40 mg twice a day - take it at 5 AM and 5 PM, at least 30 minutes before meals. Schedule barium esophagram with pill Schedule esophageal manometry - do not take pain medications 2 weeks prior to the test.

## 2022-08-07 NOTE — Progress Notes (Signed)
Maylon Peppers, M.D. Gastroenterology & Hepatology [facility] [address] [place], [place] [address]  Primary Care Physician: Alvira Monday, [address] [place] [id number]  I will communicate my assessment and recommendations to the referring MD via EMR.  Problems: IBS-C GERD Possible gastroparesis  History of Present Illness: Kathryn [name] is a 52 y.o. female with PMH anxiety, depression, asthma, GERD, HLD, HTN, Hypothyroidism, schizoaffective disorder, IBS-C, DM, who presents for follow up of GERD and IBS-C.  The patient was last seen on 09/10/2021. At that time, the patient was started on omeprazole 40 mg twice daily.  And was continued on Carafate every 6 hours.  Patient was prescribed Amitiza for IBS-C.  Patient reports that she is still having choking episodes. Is having these episodes every time she eats solids. No dysphagia with liquids. She reports that she has presented some heartburn in her mid chest. She is taking omeprazole 40 mg BID, first dose at 5 AM and second dose at 12 PM - takes the medications with her meals.  She recently received a prescription for Norco by her dentist. She states she usually does not take hydrocodone on a regular basis.  She reports that taking Reglan 5 mg TID, which helps decreasing her fullness sensation. She feels pain in her upper abdomen regularly.  She is having a BM every 3 days. She takes Linzess 290 mcg qday. She also takes Benefiber on a regular basis. Sometimes she has some accidents, as she has fecal soling during the night.  The patient denies having any nausea, vomiting, fever, chills, hematochezia, melena, hematemesis, diarrhea, jaundice, pruritus or weight loss.  Last Colonoscopy:Last Colonoscopy:07/18/20 - Preparation of the colon was fair. - One 4 mm polyp in the ascending colon, (inflammatory polyp) - Stool in the descending colon, in the transverse colon and in  the ascending colon. - The distal rectum and anal verge are normal on retroflexion view. Last Endoscopy:-05/03/21  No endoscopic esophageal abnormality to explain patient's dysphagia. Esophagus dilated. - Gastritis. Biopsied.Erythematous duodenopathy. Biopsied. No H pylori, normal biopsies.  Past Medical History: Past Medical History:  Diagnosis Date   Anemia    Anxiety    Asthma    Depression    GERD (gastroesophageal reflux disease)    HLD (hyperlipidemia) 04/07/2019   Hypertension    Hypothyroidism, adult 04/07/2019   Neuropathy    Obesity (BMI 30.0-34.9) 04/07/2019   Schizoaffective disorder ([place])    Type II diabetes mellitus, uncontrolled 04/27/2019    Past Surgical History: Past Surgical History:  Procedure Laterality Date   BACK SURGERY  09/06/2020   BIOPSY  05/03/2021   Procedure: BIOPSY;  Surgeon: [name], MD;  [facility];  Service: Gastroenterology;;   BREAST SURGERY Right    biopsy   CESAREAN SECTION     CHOLECYSTECTOMY  06/02/2012   Procedure: LAPAROSCOPIC CHOLECYSTECTOMY;  Surgeon: Jamesetta So, MD;  Location: [facility];  Service: General;  Laterality: N/A;  Attempted laparoscopic cholecystectomy   CHOLECYSTECTOMY  06/02/2012   Procedure: CHOLECYSTECTOMY;  Surgeon: Jamesetta So, MD;  Location: [facility];  Service: General;  Laterality: N/A;  converted to open at  [id number]   COLONOSCOPY WITH PROPOFOL N/A 07/18/2020   prep was fair, one 29m polyp (inflammatory) stool in descending colon, transverse and ascending, distal rectum and anal verge normal   ESOPHAGEAL DILATION  12/31/2018   Procedure: ESOPHAGEAL DILATION;  Surgeon: RRogene Houston MD;  [facility];  Service: Endoscopy;;   ESOPHAGOGASTRODUODENOSCOPY (EGD) WITH PROPOFOL N/A 08/24/2015   Procedure: ESOPHAGOGASTRODUODENOSCOPY (EGD) WITH PROPOFOL;  Surgeon: Rogene Houston, MD;  [facility];  Service: Endoscopy;  Laterality: N/A;  1:10 - Ann to notify pt to arrive  at 11:30   ESOPHAGOGASTRODUODENOSCOPY (EGD) WITH PROPOFOL N/A 12/31/2018   normal, no abnormality to explain dysphagia, esophagus dilated.   ESOPHAGOGASTRODUODENOSCOPY (EGD) WITH PROPOFOL N/A 05/03/2021   Procedure: ESOPHAGOGASTRODUODENOSCOPY (EGD) WITH PROPOFOL;  Surgeon: [name], MD;  [facility];  Service: Gastroenterology;  Laterality: N/A;  1:20   FLEXIBLE SIGMOIDOSCOPY  07/17/2020   Procedure: FLEXIBLE SIGMOIDOSCOPY;  Surgeon: [name], [name], MD;  [facility];  Service: Gastroenterology;;   POLYPECTOMY  07/18/2020   Procedure: POLYPECTOMY INTESTINAL;  Surgeon: [name], [name], MD;  [facility];  Service: Gastroenterology;;  ascending colon polyp;    SAVORY DILATION  05/03/2021   Procedure: SAVORY DILATION;  Surgeon: [name], [name], MD;  [facility];  Service: Gastroenterology;;   tooth removal  2019   all teeth removed   TUBAL LIGATION     X3    Family History: Family History  Problem Relation Age of Onset   Hypertension Mother    Alcohol abuse Mother    Heart disease Mother    Kidney disease Mother    Aneurysm Mother        brain   Hypertension Father    Alcohol abuse Sister    Alcohol abuse Maternal Grandmother    Alcohol abuse Maternal Grandfather    Hearing loss Daughter    Alcohol abuse Maternal Aunt    Alcohol abuse Paternal Aunt    Alcohol abuse Cousin    Stroke Other    Diabetes Other    Cancer Other    Seizures Other     Social History: Social History   Tobacco Use  Smoking Status Never   Passive exposure: Never  Smokeless Tobacco Never   Social History   Substance and Sexual Activity  Alcohol Use No   Alcohol/week: 0.0 standard drinks of alcohol   Social History   Substance and Sexual Activity  Drug Use No    Allergies: No Known Allergies  Medications: Current Outpatient Medications  Medication Sig Dispense Refill   albuterol (VENTOLIN HFA)  108 (90 Base) MCG/ACT inhaler Inhale 2 puffs into the lungs every 6 (six) hours as needed for wheezing or shortness of breath. 8 g 2   amLODipine (NORVASC) 5 MG tablet Take 1 tablet (5 mg total) by mouth daily. 90 tablet 1   aspirin EC 81 MG tablet Take 81 mg by mouth daily.     atorvastatin (LIPITOR) 10 MG tablet TAKE 1 TABLET BY MOUTH EVERYDAY AT BEDTIME 90 tablet 1   benztropine (COGENTIN) 1 MG tablet TAKE 1 TABLET(1 MG) BY MOUTH AT BEDTIME 90 tablet 2   blood glucose meter kit and supplies Dispense based on patient and insurance preference. Once daily testing DX E11.9 1 each 0   Blood Glucose Monitoring Suppl (ACCU-CHEK GUIDE ME) w/Device KIT 1 Piece by Does not apply route as directed. 1 kit 0   budesonide-formoterol (SYMBICORT) 80-4.5 MCG/ACT inhaler INHALE 2 PUFFS INTO THE LUNGS TWICE A DAY 10.2 each 3   carvedilol (COREG) 12.5 MG tablet Take 1 tablet (12.5 mg total) by mouth 2 (two) times daily. 180 tablet 0   Cholecalciferol (VITAMIN D-3) 125 MCG (5000 UT) TABS Take 2 tablets by mouth daily.  30 tablet 1   clonazePAM (KLONOPIN) 0.5 MG tablet Take 1 tablet (0.5 mg total) by mouth 2 (two) times daily as needed for anxiety. 180 tablet 0   Continuous Blood Gluc Receiver (DEXCOM G7 RECEIVER) DEVI USED TO MONITOR BLOOD GLUCOSE DX: E11.65 1 each 0   Continuous Blood Gluc Sensor (DEXCOM G7 SENSOR) MISC Used to monitor blood glucose DX E11.65 1 each 3   Dulaglutide (TRULICITY) 3 0000000 SOPN Inject 3 mg as directed once a week. 2 mL 2   DULoxetine (CYMBALTA) 60 MG capsule Take 1 capsule (60 mg total) by mouth 2 (two) times daily. 180 capsule 2   gabapentin (NEURONTIN) 300 MG capsule Take 1 capsule (300 mg total) by mouth daily. 90 capsule 1   glipiZIDE (GLUCOTROL) 10 MG tablet Take 1 tablet (10 mg total) by mouth 2 (two) times daily before a meal. 60 tablet 3   glucose blood (ACCU-CHEK GUIDE) test strip Use as instructed 150 each 2   HYDROcodone-acetaminophen (NORCO/VICODIN) 5-325 MG tablet Take 1  tablet by mouth every 4 (four) hours as needed.     insulin glargine, 2 Unit Dial, (TOUJEO MAX SOLOSTAR) 300 UNIT/ML Solostar Pen Inject 80 Units into the skin at bedtime. 9 mL 2   levothyroxine (SYNTHROID) 75 MCG tablet Take 75 mcg by mouth daily before breakfast.     LINZESS 290 MCG CAPS capsule Take 1 capsule (290 mcg total) by mouth daily. 30 capsule 1   losartan (COZAAR) 100 MG tablet Take 1 tablet (100 mg total) by mouth daily. 30 tablet 2   metoCLOPramide (REGLAN) 5 MG tablet TAKE 1 TABLET BY MOUTH 4 TIMES DAILY. 120 tablet 0   Multiple Vitamin (MULTIVITAMIN) tablet Take 1 tablet by mouth daily.     omeprazole (PRILOSEC) 40 MG capsule TAKE 1 CAPSULE BY MOUTH TWICE A DAY 60 capsule 3   ondansetron (ZOFRAN) 4 MG tablet Take 1 tablet (4 mg total) by mouth every 8 (eight) hours as needed for nausea or vomiting. 30 tablet 3   pantoprazole (PROTONIX) 40 MG tablet Take 40 mg by mouth daily.     penicillin v potassium (VEETID) 500 MG tablet Take 500 mg by mouth 4 (four) times daily.     Polyvinyl Alcohol-Povidone PF (REFRESH) 1.4-0.6 % SOLN Place 1 drop into both eyes daily as needed (dry eyes).     risperiDONE (RISPERDAL) 2 MG tablet TAKE 1 TABLET EVERY MORNING AND 3 TABLETS AT BEDTIME 360 tablet 3   rizatriptan (MAXALT) 10 MG tablet Take 1 tablet (10 mg total) by mouth as needed for migraine. May repeat in 2 hours if needed 10 tablet 3   sucralfate (CARAFATE) 1 GM/10ML suspension Take 10 mLs (1 g total) by mouth 4 (four) times daily -  with meals and at bedtime. 420 mL 1   SUMAtriptan (IMITREX) 50 MG tablet TAKE 1 TABLET BY MOUTH EVERY 2 HOURS AS NEEDED FOR MIGRAINE. MAY REPEAT IN 2 HOURS IF HEADACHE PERSISTS OR RECURS. 3 tablet 3   topiramate (TOPAMAX) 25 MG tablet TAKE 1 TAB AT BEDTIME FOR ONE WEEK, THEN INCREASE TO 2 TABS AT BEDTIME FOR ONE WEEK, THEN 3 TABS AT BEDTIME FOR ONE WEEK, THEN TAKE 4 TABS ('100MG'$ ) AT BEDTIME 240 tablet 0   traZODone (DESYREL) 100 MG tablet TAKE 2 TABLETS(200 MG) BY  MOUTH AT BEDTIME 180 tablet 2   No current facility-administered medications for this visit.    Review of Systems: GENERAL: negative for malaise, night sweats HEENT: No changes in  hearing or vision, no nose bleeds or other nasal problems. NECK: Negative for lumps, goiter, pain and significant neck swelling RESPIRATORY: Negative for cough, wheezing CARDIOVASCULAR: Negative for chest pain, leg swelling, palpitations, orthopnea GI: SEE HPI MUSCULOSKELETAL: Negative for joint pain or swelling, back pain, and muscle pain. SKIN: Negative for lesions, rash PSYCH: Negative for sleep disturbance, mood disorder and recent psychosocial stressors. HEMATOLOGY Negative for prolonged bleeding, bruising easily, and swollen nodes. ENDOCRINE: Negative for cold or heat intolerance, polyuria, polydipsia and goiter. NEURO: negative for tremor, gait imbalance, syncope and seizures. The remainder of the review of systems is noncontributory.   Physical Exam: BP 111/78 (BP Location: Left Arm, Patient Position: Sitting, Cuff Size: Large)   Pulse 76   Temp 98.3 F (36.8 C) (Oral)   Ht 5' (1.524 m)   Wt 153 lb 4.8 oz (69.5 kg)   LMP 01/23/2013   BMI 29.94 kg/m  GENERAL: The patient is AO x3, in no acute distress. HEENT: Head is normocephalic and atraumatic. EOMI are intact. Mouth is well hydrated and without lesions. NECK: Supple. No masses LUNGS: Clear to auscultation. No presence of rhonchi/wheezing/rales. Adequate chest expansion HEART: RRR, normal s1 and s2. ABDOMEN: Soft, nontender, no guarding, no peritoneal signs, and nondistended. BS +. No masses. EXTREMITIES: Without any cyanosis, clubbing, rash, lesions or edema. NEUROLOGIC: AOx3, no focal motor deficit. SKIN: no jaundice, no rashes  Imaging/Labs: as above  I personally reviewed and interpreted the available labs, imaging and endoscopic files.  Impression and Plan: Kathryn [name] is a 52 y.o. female with PMH anxiety, depression, asthma,  GERD, HLD, HTN, Hypothyroidism, schizoaffective disorder, IBS-C, DM, who presents for follow up of dysphagia, GERD and IBS-C.  Regarding her dysphagia, this has not improved after undergoing dilation in the past.  I do not suspect that the use of opiates has any role on her current symptoms as she has used these rarely.  Will need to explore this further with a barium esophagram and an esophageal manometry.  She should avoid taking any opiates prior to this.  Regarding her constipation, it seems that she is presenting some regular bowel movements intermittently with her current releases dosage.  She will continue with 290 mcg/day of Linzess but she will stop taking Benefiber.  If her constipation worsens she can add MiraLAX to her regimen.  Gastroparesis appears to be controlled with Reglan 5 mg 3 times daily which she should continue for now as she has not presented any side effects with this.  Finally, she has presented recurrent episodes of heartburn despite taking PPI compliantly.  I advised on the proper fashion to take this medication, she should implement these changes to ensure better symptom control.  - Continue Linzess 290 mcg daily - Stop Benefiber. If constipation worsens, start Miralax 1-3 capfuls every day -Continue Reglan 5 mg with every meal -Continue omeprazole 40 mg twice a day - take it at 5 AM and 5 PM, at least 30 minutes before meals. -Schedule barium esophagram with pill -Schedule esophageal manometry.  All questions were answered.      Maylon Peppers, MD Gastroenterology and Hepatology [facility]

## 2022-08-08 ENCOUNTER — Telehealth (INDEPENDENT_AMBULATORY_CARE_PROVIDER_SITE_OTHER): Payer: Self-pay | Admitting: Gastroenterology

## 2022-08-08 NOTE — Telephone Encounter (Signed)
Esophagram scheduled for 08/11/22 at 10:30 am arrive at 10:15. NPO after midnight. Attempted to contact pt but had to leave detailed message on voicemail

## 2022-08-08 NOTE — Telephone Encounter (Signed)
Submitted request through cover my meds. Await decision

## 2022-08-11 ENCOUNTER — Ambulatory Visit (HOSPITAL_COMMUNITY)
Admission: RE | Admit: 2022-08-11 | Discharge: 2022-08-11 | Disposition: A | Discharge status: Home / Self Care | Payer: BC Managed Care – PPO | Source: Ambulatory Visit | Attending: Gastroenterology | Admitting: Gastroenterology

## 2022-08-11 ENCOUNTER — Encounter (HOSPITAL_COMMUNITY): Payer: BC Managed Care – PPO

## 2022-08-11 DIAGNOSIS — R1319 Other dysphagia: Secondary | ICD-10-CM | POA: Diagnosis not present

## 2022-08-11 DIAGNOSIS — K219 Gastro-esophageal reflux disease without esophagitis: Secondary | ICD-10-CM | POA: Diagnosis not present

## 2022-08-11 DIAGNOSIS — K224 Dyskinesia of esophagus: Secondary | ICD-10-CM | POA: Diagnosis not present

## 2022-08-11 NOTE — Telephone Encounter (Signed)
Pt had exam completed today

## 2022-08-12 ENCOUNTER — Telehealth (INDEPENDENT_AMBULATORY_CARE_PROVIDER_SITE_OTHER): Payer: Self-pay | Admitting: *Deleted

## 2022-08-12 NOTE — Telephone Encounter (Signed)
PA submitted for omeprazole 40mg  bid through cover my meds. Insurance denied twice daily dosing because notes did not show pt tried and failed once daily dosing. Pt was on once daily dosing from 07/10/20 and changed to bid on 09/10/21. I submitted an appeal through cover my meds and received some forms to fill out through fax machine. Form on Dr. Lurline Del desk to sign and I will fax back after getting signature.

## 2022-08-12 NOTE — Telephone Encounter (Signed)
PA was denied and appeal was sent on 08/11/22. Await decision

## 2022-08-13 ENCOUNTER — Encounter (HOSPITAL_COMMUNITY): Payer: BC Managed Care – PPO

## 2022-08-14 NOTE — Telephone Encounter (Signed)
Checked on appeal through cover my meds and its still pending.

## 2022-08-14 NOTE — Telephone Encounter (Signed)
APPEAL STILL PENDING REVIEW

## 2022-08-15 NOTE — Telephone Encounter (Signed)
Appeal still pending

## 2022-08-18 NOTE — Telephone Encounter (Signed)
Appeal still pending

## 2022-08-19 ENCOUNTER — Telehealth: Payer: Self-pay | Admitting: Family Medicine

## 2022-08-19 ENCOUNTER — Ambulatory Visit: Payer: BC Managed Care – PPO | Admitting: Family Medicine

## 2022-08-19 NOTE — Telephone Encounter (Signed)
Kindly proceed with the clinic policy, given the patient has been fully aware of the clinic's policy and subsequent actions. Kindly inform the patient that I will refill medications prescribed by me for 30 days.

## 2022-08-19 NOTE — Telephone Encounter (Signed)
Appeal still pending

## 2022-08-19 NOTE — Telephone Encounter (Signed)
Pt has missed 4 appts (07/25/21, 12/18/21, 12/27/21 & 08/19/22). How would you like to proceed?

## 2022-08-20 ENCOUNTER — Encounter: Payer: Self-pay | Admitting: Family Medicine

## 2022-08-21 NOTE — Telephone Encounter (Signed)
PA still pending.  

## 2022-08-25 NOTE — Telephone Encounter (Signed)
Appeal still pending

## 2022-08-27 NOTE — Telephone Encounter (Signed)
I was going to call [facility] in to to figure out what insurance this needs to go through and the script sent in on 08/07/22 had a note to pharmacy to use good rx card.

## 2022-08-27 NOTE — Telephone Encounter (Signed)
Called number for [facility] on cover my meds to check on status of appeal. Was told I need to call [employer] at [phone]. I called and was told they have no claim for her.

## 2022-09-05 ENCOUNTER — Telehealth (INDEPENDENT_AMBULATORY_CARE_PROVIDER_SITE_OTHER): Payer: Self-pay | Admitting: *Deleted

## 2022-09-05 NOTE — Telephone Encounter (Signed)
Appeal done through cover my meds on 08/11/22. Letter from [employer]. Request received on 08/11/22 for omeprazole 40mg  was approved from 09/04/22 - 09/04/23. Auth # [id number] A DH  Letter of approval faxed to [facility].

## 2022-09-11 ENCOUNTER — Other Ambulatory Visit: Payer: Self-pay | Admitting: Family Medicine

## 2022-09-11 DIAGNOSIS — I10 Essential (primary) hypertension: Secondary | ICD-10-CM

## 2022-09-14 ENCOUNTER — Other Ambulatory Visit: Payer: Self-pay | Admitting: Family Medicine

## 2022-09-14 DIAGNOSIS — E1165 Type 2 diabetes mellitus with hyperglycemia: Secondary | ICD-10-CM

## 2022-09-14 DIAGNOSIS — I10 Essential (primary) hypertension: Secondary | ICD-10-CM

## 2022-09-15 ENCOUNTER — Other Ambulatory Visit: Payer: Self-pay | Admitting: Family Medicine

## 2022-09-15 DIAGNOSIS — G43009 Migraine without aura, not intractable, without status migrainosus: Secondary | ICD-10-CM

## 2022-09-16 ENCOUNTER — Other Ambulatory Visit: Payer: Self-pay | Admitting: Family Medicine

## 2022-09-16 DIAGNOSIS — E782 Mixed hyperlipidemia: Secondary | ICD-10-CM

## 2022-09-16 DIAGNOSIS — J4540 Moderate persistent asthma, uncomplicated: Secondary | ICD-10-CM

## 2022-09-16 DIAGNOSIS — E039 Hypothyroidism, unspecified: Secondary | ICD-10-CM

## 2022-09-16 DIAGNOSIS — K219 Gastro-esophageal reflux disease without esophagitis: Secondary | ICD-10-CM

## 2022-09-16 DIAGNOSIS — E1165 Type 2 diabetes mellitus with hyperglycemia: Secondary | ICD-10-CM

## 2022-09-16 DIAGNOSIS — I10 Essential (primary) hypertension: Secondary | ICD-10-CM

## 2022-09-16 MED ORDER — CARVEDILOL 12.5 MG PO TABS: 12.5000 mg | ORAL_TABLET | Freq: Two times a day (BID) | ORAL | 1 refills | Status: DC

## 2022-09-16 MED ORDER — ALBUTEROL SULFATE HFA 108 (90 BASE) MCG/ACT IN AERS: 2.0000 | INHALATION_SPRAY | Freq: Four times a day (QID) | RESPIRATORY_TRACT | 2 refills | Status: AC | PRN

## 2022-09-16 MED ORDER — BUDESONIDE-FORMOTEROL FUMARATE 80-4.5 MCG/ACT IN AERO: INHALATION_SPRAY | RESPIRATORY_TRACT | 3 refills | Status: DC

## 2022-09-16 MED ORDER — AMLODIPINE BESYLATE 5 MG PO TABS: 5.0000 mg | ORAL_TABLET | Freq: Every day | ORAL | 1 refills | Status: DC

## 2022-09-16 MED ORDER — ATORVASTATIN CALCIUM 10 MG PO TABS: 10.0000 mg | ORAL_TABLET | Freq: Every day | ORAL | 2 refills | Status: DC

## 2022-09-26 ENCOUNTER — Telehealth (HOSPITAL_COMMUNITY): Payer: BC Managed Care – PPO | Admitting: Psychiatry

## 2022-09-26 ENCOUNTER — Encounter (HOSPITAL_COMMUNITY): Payer: Self-pay

## 2022-10-05 ENCOUNTER — Other Ambulatory Visit: Payer: Self-pay | Admitting: Psychiatry

## 2022-10-15 ENCOUNTER — Ambulatory Visit: Payer: BC Managed Care – PPO | Admitting: Nurse Practitioner

## 2022-10-23 ENCOUNTER — Encounter (HOSPITAL_COMMUNITY): Payer: Self-pay | Admitting: Psychiatry

## 2022-10-23 ENCOUNTER — Ambulatory Visit (INDEPENDENT_AMBULATORY_CARE_PROVIDER_SITE_OTHER): Payer: BC Managed Care – PPO | Admitting: Psychiatry

## 2022-10-23 VITALS — BP 154/94 | HR 96 | Ht 60.0 in | Wt 157.6 lb

## 2022-10-23 DIAGNOSIS — F251 Schizoaffective disorder, depressive type: Secondary | ICD-10-CM

## 2022-10-23 DIAGNOSIS — F4310 Post-traumatic stress disorder, unspecified: Secondary | ICD-10-CM | POA: Diagnosis not present

## 2022-10-23 MED ORDER — RISPERIDONE 2 MG PO TABS: ORAL_TABLET | ORAL | 3 refills | Status: DC

## 2022-10-23 MED ORDER — FLUOXETINE HCL 40 MG PO CAPS: 40.0000 mg | ORAL_CAPSULE | Freq: Every day | ORAL | 2 refills | Status: DC

## 2022-10-23 MED ORDER — TRAZODONE HCL 100 MG PO TABS: 300.0000 mg | ORAL_TABLET | Freq: Every day | ORAL | 2 refills | Status: DC

## 2022-10-23 MED ORDER — CLONAZEPAM 0.5 MG PO TABS: 0.5000 mg | ORAL_TABLET | Freq: Two times a day (BID) | ORAL | 0 refills | Status: DC | PRN
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
## 2022-10-27 ENCOUNTER — Ambulatory Visit (INDEPENDENT_AMBULATORY_CARE_PROVIDER_SITE_OTHER): Payer: BC Managed Care – PPO | Admitting: Nurse Practitioner

## 2022-10-27 ENCOUNTER — Encounter: Payer: Self-pay | Admitting: Nurse Practitioner

## 2022-10-27 ENCOUNTER — Other Ambulatory Visit: Payer: Self-pay | Admitting: Nurse Practitioner

## 2022-10-27 VITALS — BP 131/79 | HR 87 | Temp 97.4°F | Ht 60.0 in | Wt 157.0 lb

## 2022-10-27 DIAGNOSIS — E039 Hypothyroidism, unspecified: Secondary | ICD-10-CM

## 2022-10-27 DIAGNOSIS — I10 Essential (primary) hypertension: Secondary | ICD-10-CM | POA: Diagnosis not present

## 2022-10-27 DIAGNOSIS — Z794 Long term (current) use of insulin: Secondary | ICD-10-CM

## 2022-10-27 DIAGNOSIS — E1165 Type 2 diabetes mellitus with hyperglycemia: Secondary | ICD-10-CM | POA: Diagnosis not present

## 2022-10-27 DIAGNOSIS — K219 Gastro-esophageal reflux disease without esophagitis: Secondary | ICD-10-CM

## 2022-10-27 DIAGNOSIS — F339 Major depressive disorder, recurrent, unspecified: Secondary | ICD-10-CM

## 2022-10-27 DIAGNOSIS — Z1231 Encounter for screening mammogram for malignant neoplasm of breast: Secondary | ICD-10-CM

## 2022-10-27 DIAGNOSIS — E782 Mixed hyperlipidemia: Secondary | ICD-10-CM

## 2022-10-27 LAB — POCT GLYCOSYLATED HEMOGLOBIN (HGB A1C): Hemoglobin A1C: 10.6 % — AB (ref 4.0–5.6)

## 2022-10-27 MED ORDER — LOSARTAN POTASSIUM 100 MG PO TABS: 100.0000 mg | ORAL_TABLET | Freq: Every day | ORAL | 1 refills | Status: DC

## 2022-10-27 MED ORDER — GLIPIZIDE 10 MG PO TABS: 10.0000 mg | ORAL_TABLET | Freq: Two times a day (BID) | ORAL | 3 refills | Status: DC

## 2022-10-27 MED ORDER — TRULICITY 4.5 MG/0.5ML ~~LOC~~ SOAJ: 4.5000 mg | SUBCUTANEOUS | 3 refills | Status: DC

## 2022-10-27 MED ORDER — AMLODIPINE BESYLATE 5 MG PO TABS: 5.0000 mg | ORAL_TABLET | Freq: Every day | ORAL | 1 refills | Status: DC

## 2022-10-27 MED ORDER — TOUJEO MAX SOLOSTAR 300 UNIT/ML ~~LOC~~ SOPN: 80.0000 [IU] | PEN_INJECTOR | Freq: Every day | SUBCUTANEOUS | 2 refills | Status: DC

## 2022-10-27 MED ORDER — DEXCOM G7 SENSOR MISC: 3 refills | Status: DC

## 2022-10-27 MED ORDER — CARVEDILOL 12.5 MG PO TABS: 12.5000 mg | ORAL_TABLET | Freq: Two times a day (BID) | ORAL | 1 refills | Status: DC

## 2022-10-27 NOTE — Progress Notes (Signed)
New Patient Office Visit  Subjective:  Patient ID: Kathryn [name], female    DOB: Oct 14, 1970  Age: 52 y.o. MRN: [id number]  CC:  Chief Complaint  Patient presents with   Establish Care    HPI Kathryn [name] is a 52 y.o. female  has a past medical history of Anemia, Anxiety, Asthma, Depression, GERD (gastroesophageal reflux disease), HLD (hyperlipidemia) (04/07/2019), Hypertension, Hypothyroidism, adult (04/07/2019), Neuropathy, Obesity (BMI 30.0-34.9) (04/07/2019), Schizoaffective disorder (HCC), and Type II diabetes mellitus, uncontrolled (04/27/2019).  Patient presents to establish care for her chronic medical conditions. Previous patient of Gilmore Laroche NP .  This patient is known to me as I had taken care of her previously at [facility].  She is considering relocating from [place], hence the  need for a new PCP.  Uncontrolled type 2 diabetes .currently on Toujeo 80 units daily, glipizide 10 mg twice daily, Trulicity 3 mg once weekly injection .  Stated that she has made changes to her diet since the past 2 weeks , not drinking soda or juice anymore. she was previously being followed by Dr. Fransico Him for her uncontrolled type 2 diabetes.  She does not want to follow-up with them anymore.  She denies polyuria, polydipsia, polyphagia, hypoglycemia.  Has Dexcom for blood sugar monitoring.  Hypertension.  Currently on amlodipine 5 mg daily, carvedilol 12.5 mg twice daily, losartan 100 mg daily.  Patient denies chest pain, denies dizziness, edema.  GERD.  Currently on omeprazole 40 mg twice daily.  She denies abdominal pain, nausea, vomiting.  Has upcoming esophageal manometry for his esophogeal dysphagia   Past Medical History:  Diagnosis Date   Anemia    Anxiety    Asthma    Depression    GERD (gastroesophageal reflux disease)    HLD (hyperlipidemia) 04/07/2019   Hypertension    Hypothyroidism, adult 04/07/2019   Neuropathy    Obesity (BMI 30.0-34.9) 04/07/2019   Schizoaffective  disorder (HCC)    Type II diabetes mellitus, uncontrolled 04/27/2019    Past Surgical History:  Procedure Laterality Date   BACK SURGERY  09/06/2020   BIOPSY  05/03/2021   Procedure: BIOPSY;  Surgeon: Dolores Frame, MD;  [facility];  Service: Gastroenterology;;   BREAST SURGERY Right    biopsy   CESAREAN SECTION     CHOLECYSTECTOMY  06/02/2012   Procedure: LAPAROSCOPIC CHOLECYSTECTOMY;  Surgeon: Dalia Heading, MD;  Location: [facility];  Service: General;  Laterality: N/A;  Attempted laparoscopic cholecystectomy   CHOLECYSTECTOMY  06/02/2012   Procedure: CHOLECYSTECTOMY;  Surgeon: Dalia Heading, MD;  Location: [facility];  Service: General;  Laterality: N/A;  converted to open at  9562   COLONOSCOPY WITH PROPOFOL N/A 07/18/2020   prep was fair, one 4mm polyp (inflammatory) stool in descending colon, transverse and ascending, distal rectum and anal verge normal   ESOPHAGEAL DILATION  12/31/2018   Procedure: ESOPHAGEAL DILATION;  Surgeon: Malissa Hippo, MD;  [facility];  Service: Endoscopy;;   ESOPHAGOGASTRODUODENOSCOPY (EGD) WITH PROPOFOL N/A 08/24/2015   Procedure: ESOPHAGOGASTRODUODENOSCOPY (EGD) WITH PROPOFOL;  Surgeon: Malissa Hippo, MD;  [facility];  Service: Endoscopy;  Laterality: N/A;  1:10 - Ann to notify pt to arrive at 11:30   ESOPHAGOGASTRODUODENOSCOPY (EGD) WITH PROPOFOL N/A 12/31/2018   normal, no abnormality to explain dysphagia, esophagus dilated.   ESOPHAGOGASTRODUODENOSCOPY (EGD) WITH PROPOFOL N/A 05/03/2021   Procedure: ESOPHAGOGASTRODUODENOSCOPY (EGD) WITH PROPOFOL;  Surgeon: Dolores Frame, MD;  [facility];  Service: Gastroenterology;  Laterality: N/A;  1:20   FLEXIBLE SIGMOIDOSCOPY  07/17/2020   Procedure: FLEXIBLE SIGMOIDOSCOPY;  Surgeon: Marguerita Merles, Reuel Boom, MD;  [facility]: [facility];  Service: Gastroenterology;;   POLYPECTOMY  07/18/2020   Procedure: POLYPECTOMY INTESTINAL;  Surgeon:  Marguerita Merles, Reuel Boom, MD;  [facility];  Service: Gastroenterology;;  ascending colon polyp;    SAVORY DILATION  05/03/2021   Procedure: SAVORY DILATION;  Surgeon: Dolores Frame, MD;  [facility];  Service: Gastroenterology;;   tooth removal  2019   all teeth removed   TUBAL LIGATION     X3    Family History  Problem Relation Age of Onset   Hypertension Mother    Alcohol abuse Mother    Heart disease Mother    Kidney disease Mother    Aneurysm Mother        brain   Hypertension Father    Alcohol abuse Sister    Alcohol abuse Maternal Grandmother    Alcohol abuse Maternal Grandfather    Hearing loss Daughter    Alcohol abuse Maternal Aunt    Alcohol abuse Paternal Aunt    Alcohol abuse Cousin    Stroke Other    Diabetes Other    Cancer Other    Seizures Other     Social History   Socioeconomic History   Marital status: Married    Spouse name: Kendell Bane   Number of children: 3   Years of education: 12   Highest education level: Not on file  Occupational History    Comment: [employer]    Comment: 3rd shift  Tobacco Use   Smoking status: Never    Passive exposure: Never   Smokeless tobacco: Never  Vaping Use   Vaping Use: Never used  Substance and Sexual Activity   Alcohol use: No    Alcohol/week: 0.0 standard drinks of alcohol   Drug use: No   Sexual activity: Not Currently    Birth control/protection: Surgical  Other Topics Concern   Not on file  Social History Narrative   Married for 22 years.On disability secondary to schizophrenia.   Lives with husband.   Caffeine- decaf coffee, tea   Social Determinants of Health   Financial Resource Strain: Not on file  Food Insecurity: Not on file  Transportation Needs: Not on file  Physical Activity: Not on file  Stress: Not on file  Social Connections: Not on file  Intimate Partner Violence: Not on file    ROS Review of Systems  Constitutional:  Negative for activity change,  appetite change, chills, fatigue and fever.  HENT:  Negative for congestion, dental problem, ear discharge, ear pain, hearing loss, rhinorrhea, sinus pressure, sinus pain, sneezing and sore throat.   Eyes:  Negative for pain, discharge and itching.  Respiratory:  Negative for cough, chest tightness, shortness of breath and wheezing.   Cardiovascular:  Negative for chest pain, palpitations and leg swelling.  Gastrointestinal:  Negative for abdominal distention, abdominal pain, anal bleeding and blood in stool.  Endocrine: Negative for cold intolerance, heat intolerance, polydipsia, polyphagia and polyuria.  Genitourinary:  Negative for difficulty urinating, dysuria, flank pain, frequency, hematuria, menstrual problem, pelvic pain and vaginal bleeding.  Musculoskeletal:  Negative for arthralgias, back pain, gait problem, joint swelling and myalgias.  Skin:  Negative for color change, pallor, rash and wound.  Allergic/Immunologic: Negative for environmental allergies, food allergies and immunocompromised state.  Neurological:  Negative for dizziness, tremors, facial asymmetry, weakness and headaches.  Hematological:  Negative for adenopathy. Does not bruise/bleed easily.  Psychiatric/Behavioral:  Negative for agitation, behavioral problems, confusion, decreased concentration, self-injury and suicidal ideas.     Objective:   Today's Vitals: BP 131/79   Pulse 87   Temp (!) 97.4 F (36.3 C)   Ht 5' (1.524 m)   Wt 157 lb (71.2 kg)   LMP 01/23/2013   SpO2 99%   BMI 30.66 kg/m   Physical Exam Vitals and nursing note reviewed.  Constitutional:      General: She is not in acute distress.    Appearance: Normal appearance. She is obese. She is not ill-appearing, toxic-appearing or diaphoretic.  HENT:     Mouth/Throat:     Mouth: Mucous membranes are moist.     Pharynx: Oropharynx is clear. No oropharyngeal exudate or posterior oropharyngeal erythema.  Eyes:     General: No scleral icterus.        Right eye: No discharge.        Left eye: No discharge.     Extraocular Movements: Extraocular movements intact.     Conjunctiva/sclera: Conjunctivae normal.  Cardiovascular:     Rate and Rhythm: Normal rate and regular rhythm.     Pulses: Normal pulses.     Heart sounds: Normal heart sounds. No murmur heard.    No friction rub. No gallop.  Pulmonary:     Effort: Pulmonary effort is normal. No respiratory distress.     Breath sounds: Normal breath sounds. No stridor. No wheezing, rhonchi or rales.  Chest:     Chest wall: No tenderness.  Abdominal:     General: There is no distension.     Palpations: Abdomen is soft.     Tenderness: There is no abdominal tenderness. There is no right CVA tenderness, left CVA tenderness or guarding.  Musculoskeletal:        General: No swelling, tenderness, deformity or signs of injury.     Right lower leg: No edema.     Left lower leg: No edema.  Skin:    General: Skin is warm and dry.     Capillary Refill: Capillary refill takes 2 to 3 seconds.     Coloration: Skin is not jaundiced or pale.     Findings: No bruising, erythema or lesion.  Neurological:     Mental Status: She is alert and oriented to person, place, and time.     Motor: No weakness.     Coordination: Coordination normal.     Gait: Gait normal.  Psychiatric:        Mood and Affect: Mood normal.        Behavior: Behavior normal.        Thought Content: Thought content normal.        Judgment: Judgment normal.     Assessment & Plan:   Problem List Items Addressed This Visit       Cardiovascular and Mediastinum   Essential hypertension, benign    BP Readings from Last 3 Encounters:  10/27/22 131/79  08/07/22 111/78  06/23/22 119/81   HTN Controlled . Continue current medications. No changes in management. Discussed DASH diet and dietary sodium restrictions Continue to increase dietary efforts and exercise.  Medications refilled today        Relevant  Medications   carvedilol (COREG) 12.5 MG tablet   losartan (COZAAR) 100 MG tablet   amLODipine (NORVASC) 5 MG tablet     Digestive   Chronic GERD    Continue omeprazole  40 mg twice daily Maintain close follow-up with GI        Endocrine   Hypothyroidism    Lab Results  Component Value Date   TSH 1.030 05/20/2022  Currently on levothyroxine 75 mcg daily Check TSH T4 levels      Relevant Medications   carvedilol (COREG) 12.5 MG tablet   Other Relevant Orders   TSH + free T4   Uncontrolled type 2 diabetes mellitus with hyperglycemia, with long-term current use of insulin (HCC) - Primary    Lab Results  Component Value Date   HGBA1C 10.6 (A) 10/27/2022  Chronic uncontrolled condition Start Trulicity 4 mg once weekly injection Continue Toujeo 80 units daily, glipizide 10 mg twice daily Dexcom supply ordered Patient counseled on low-carb modified diet CBG goals discussed with the patient She agrees for referral to a new endocrinologist Will consult the clinical pharmacist to assist with medication management Medications refilled today Patient encouraged to get her diabetes eye exam done Follow-up in 3 months.        Relevant Medications   Dulaglutide (TRULICITY) 4.5 MG/0.5ML SOPN   insulin glargine, 2 Unit Dial, (TOUJEO MAX SOLOSTAR) 300 UNIT/ML Solostar Pen   glipiZIDE (GLUCOTROL) 10 MG tablet   losartan (COZAAR) 100 MG tablet   Other Relevant Orders   POCT glycosylated hemoglobin (Hb A1C) (Completed)   CBC   CMP14+EGFR   AMB Referral to Pharmacy Medication Management   Ambulatory referral to Endocrinology     Other   Mixed hyperlipidemia    Lab Results  Component Value Date   CHOL 123 05/20/2022   HDL 60 05/20/2022   LDLCALC 45 05/20/2022   TRIG 94 05/20/2022   CHOLHDL 2.1 05/20/2022  Currently well-controlled on atorvastatin 10 mg daily Continue current medication      Relevant Medications   carvedilol (COREG) 12.5 MG tablet   losartan (COZAAR) 100  MG tablet   amLODipine (NORVASC) 5 MG tablet   Depression, recurrent (HCC)    Currently on Prozac 40 mg daily, risperidone 2 mg daily, trazodone 300 mg daily , clonazepam 0.5 mg twice daily as needed  Still grieving the loss of her 7-year-old granddaughter. She has been following up regularly with psychiatrist She denies SI, HI Need to maintain close follow-up with psychiatrist discussed. Patient encouraged to call 911 or go to the ER if she feels suicidal Flowsheet Row Office Visit from 10/27/2022 in [facility]  PHQ-9 Total Score 26            Other Visit Diagnoses     Screening mammogram for breast cancer       Relevant Orders   MM Digital Screening       Outpatient Encounter Medications as of 10/27/2022  Medication Sig   albuterol (VENTOLIN HFA) 108 (90 Base) MCG/ACT inhaler Inhale 2 puffs into the lungs every 6 (six) hours as needed for wheezing or shortness of breath.   aspirin EC 81 MG tablet Take 81 mg by mouth daily.   atorvastatin (LIPITOR) 10 MG tablet Take 1 tablet (10 mg total) by mouth daily.   benztropine (COGENTIN) 1 MG tablet TAKE 1 TABLET(1 MG) BY MOUTH AT BEDTIME   budesonide-formoterol (SYMBICORT) 80-4.5 MCG/ACT inhaler INHALE 2 PUFFS INTO THE LUNGS TWICE A DAY   Cholecalciferol (VITAMIN D-3) 125 MCG (5000 UT) TABS Take 2 tablets by mouth daily.   clonazePAM (KLONOPIN) 0.5 MG tablet Take 1 tablet (0.5 mg total) by mouth 2 (two) times daily as needed for  anxiety.   Continuous Blood Gluc Receiver (DEXCOM G7 RECEIVER) DEVI USED TO MONITOR BLOOD GLUCOSE DX: E11.65   Continuous Blood Gluc Sensor (DEXCOM G7 SENSOR) MISC Used to monitor blood glucose DX E11.65   Continuous Glucose Sensor (DEXCOM G7 SENSOR) MISC Please use as directed.   Dulaglutide (TRULICITY) 4.5 MG/0.5ML SOPN Inject 4.5 mg as directed once a week.   FLUoxetine (PROZAC) 40 MG capsule Take 1 capsule (40 mg total) by mouth daily.   gabapentin (NEURONTIN) 300 MG capsule Take 1  capsule (300 mg total) by mouth daily.   levothyroxine (SYNTHROID) 75 MCG tablet Take 75 mcg by mouth daily before breakfast.   LINZESS 290 MCG CAPS capsule Take 1 capsule (290 mcg total) by mouth daily.   metoCLOPramide (REGLAN) 5 MG tablet TAKE 1 TABLET BY MOUTH 4 TIMES DAILY.   Multiple Vitamin (MULTIVITAMIN) tablet Take 1 tablet by mouth daily.   omeprazole (PRILOSEC) 40 MG capsule Take 1 capsule (40 mg total) by mouth 2 (two) times daily.   ondansetron (ZOFRAN) 4 MG tablet Take 1 tablet (4 mg total) by mouth every 8 (eight) hours as needed for nausea or vomiting.   risperiDONE (RISPERDAL) 2 MG tablet TAKE 1 TABLET EVERY MORNING AND 3 TABLETS AT BEDTIME   rizatriptan (MAXALT) 10 MG tablet Take 1 tablet (10 mg total) by mouth as needed for migraine. May repeat in 2 hours if needed   traMADol (ULTRAM) 50 MG tablet Take 50 mg by mouth every 6 (six) hours as needed.   traZODone (DESYREL) 100 MG tablet Take 3 tablets (300 mg total) by mouth at bedtime.   [DISCONTINUED] amLODipine (NORVASC) 5 MG tablet Take 1 tablet (5 mg total) by mouth daily.   [DISCONTINUED] carvedilol (COREG) 12.5 MG tablet Take 1 tablet (12.5 mg total) by mouth 2 (two) times daily.   [DISCONTINUED] Dulaglutide (TRULICITY) 3 MG/0.5ML SOPN Inject 3 mg as directed once a week.   [DISCONTINUED] glipiZIDE (GLUCOTROL) 10 MG tablet Take 1 tablet (10 mg total) by mouth 2 (two) times daily before a meal.   [DISCONTINUED] losartan (COZAAR) 100 MG tablet Take 1 tablet (100 mg total) by mouth daily.   [DISCONTINUED] penicillin v potassium (VEETID) 500 MG tablet Take 500 mg by mouth 4 (four) times daily.   amLODipine (NORVASC) 5 MG tablet Take 1 tablet (5 mg total) by mouth daily.   blood glucose meter kit and supplies Dispense based on patient and insurance preference. Once daily testing DX E11.9 (Patient not taking: Reported on 10/27/2022)   Blood Glucose Monitoring Suppl (ACCU-CHEK GUIDE ME) w/Device KIT 1 Piece by Does not apply route as  directed. (Patient not taking: Reported on 10/27/2022)   carvedilol (COREG) 12.5 MG tablet Take 1 tablet (12.5 mg total) by mouth 2 (two) times daily.   glipiZIDE (GLUCOTROL) 10 MG tablet Take 1 tablet (10 mg total) by mouth 2 (two) times daily before a meal.   glucose blood (ACCU-CHEK GUIDE) test strip Use as instructed (Patient not taking: Reported on 10/27/2022)   HYDROcodone-acetaminophen (NORCO/VICODIN) 5-325 MG tablet Take 1 tablet by mouth every 4 (four) hours as needed. (Patient not taking: Reported on 10/27/2022)   insulin glargine, 2 Unit Dial, (TOUJEO MAX SOLOSTAR) 300 UNIT/ML Solostar Pen Inject 80 Units into the skin at bedtime.   losartan (COZAAR) 100 MG tablet Take 1 tablet (100 mg total) by mouth daily.   Polyvinyl Alcohol-Povidone PF (REFRESH) 1.4-0.6 % SOLN Place 1 drop into both eyes daily as needed (dry eyes). (Patient not taking:  Reported on 10/27/2022)   sucralfate (CARAFATE) 1 GM/10ML suspension Take 10 mLs (1 g total) by mouth 4 (four) times daily -  with meals and at bedtime. (Patient not taking: Reported on 10/27/2022)   topiramate (TOPAMAX) 25 MG tablet TAKE 1 TAB AT BEDTIME FOR ONE WEEK, THEN INCREASE TO 2 TABS AT BEDTIME FOR ONE WEEK, THEN 3 TABS AT BEDTIME FOR ONE WEEK, THEN TAKE 4 TABS (100MG ) AT BEDTIME (Patient not taking: Reported on 10/27/2022)   [DISCONTINUED] insulin glargine, 2 Unit Dial, (TOUJEO MAX SOLOSTAR) 300 UNIT/ML Solostar Pen Inject 80 Units into the skin at bedtime.   [DISCONTINUED] SUMAtriptan (IMITREX) 50 MG tablet TAKE 1 TABLET BY MOUTH EVERY 2 HOURS AS NEEDED FOR MIGRAINE. MAY REPEAT IN 2 HOURS IF HEADACHE PERSISTS OR RECURS. (Patient not taking: Reported on 10/27/2022)   No facility-administered encounter medications on file as of 10/27/2022.    Follow-up: Return in about 3 months (around 01/27/2023).   Donell Beers, FNP

## 2022-10-27 NOTE — Assessment & Plan Note (Signed)
BP Readings from Last 3 Encounters:  10/27/22 131/79  08/07/22 111/78  06/23/22 119/81   HTN Controlled . Continue current medications. No changes in management. Discussed DASH diet and dietary sodium restrictions Continue to increase dietary efforts and exercise.  Medications refilled today

## 2022-10-27 NOTE — Patient Instructions (Signed)
  Goal for fasting blood sugar ranges from 80 to 120 and 2 hours after any meal or at bedtime should be between 130 to 170.    1. Uncontrolled type 2 diabetes mellitus with hyperglycemia (HCC)  - POCT glycosylated hemoglobin (Hb A1C) - CBC - CMP14+EGFR - Dulaglutide (TRULICITY) 4.5 MG/0.5ML SOPN; Inject 4.5 mg as directed once a week.  Dispense: 2 mL; Refill: 3 - insulin glargine, 2 Unit Dial, (TOUJEO MAX SOLOSTAR) 300 UNIT/ML Solostar Pen; Inject 80 Units into the skin at bedtime.  Dispense: 9 mL; Refill: 2 - AMB Referral to Pharmacy Medication Management - glipiZIDE (GLUCOTROL) 10 MG tablet; Take 1 tablet (10 mg total) by mouth 2 (two) times daily before a meal.  Dispense: 60 tablet; Refill: 3 - Ambulatory referral to Endocrinology  2. Screening mammogram for breast cancer  - MM Digital Screening; Future  3. Essential hypertension, benign  - carvedilol (COREG) 12.5 MG tablet; Take 1 tablet (12.5 mg total) by mouth 2 (two) times daily.  Dispense: 180 tablet; Refill: 1 - losartan (COZAAR) 100 MG tablet; Take 1 tablet (100 mg total) by mouth daily.  Dispense: 90 tablet; Refill: 1 - amLODipine (NORVASC) 5 MG tablet; Take 1 tablet (5 mg total) by mouth daily.  Dispense: 90 tablet; Refill: 1  4. Hypothyroidism, unspecified type  - TSH + free T4   It is important that you exercise regularly at least 30 minutes 5 times a week as tolerated  Think about what you will eat, plan ahead. Choose " clean, green, fresh or frozen" over canned, processed or packaged foods which are more sugary, salty and fatty. 70 to 75% of food eaten should be vegetables and fruit. Three meals at set times with snacks allowed between meals, but they must be fruit or vegetables. Aim to eat over a 12 hour period , example 7 am to 7 pm, and STOP after  your last meal of the day. Drink water,generally about 64 ounces per day, no other drink is as healthy. Fruit juice is best enjoyed in a healthy way, by EATING the  fruit.  [facility] for choosing [facility] we consider it a privelige to serve you.

## 2022-10-27 NOTE — Assessment & Plan Note (Signed)
Lab Results  Component Value Date   TSH 1.030 05/20/2022  Currently on levothyroxine 75 mcg daily Check TSH T4 levels

## 2022-10-27 NOTE — Assessment & Plan Note (Signed)
Lab Results  Component Value Date   CHOL 123 05/20/2022   HDL 60 05/20/2022   LDLCALC 45 05/20/2022   TRIG 94 05/20/2022   CHOLHDL 2.1 05/20/2022  Currently well-controlled on atorvastatin 10 mg daily Continue current medication

## 2022-10-27 NOTE — Assessment & Plan Note (Signed)
Continue omeprazole 40 mg twice daily Maintain close follow-up with GI

## 2022-10-27 NOTE — Assessment & Plan Note (Signed)
Lab Results  Component Value Date   HGBA1C 10.6 (A) 10/27/2022  Chronic uncontrolled condition Start Trulicity 4 mg once weekly injection Continue Toujeo 80 units daily, glipizide 10 mg twice daily Dexcom supply ordered Patient counseled on low-carb modified diet CBG goals discussed with the patient She agrees for referral to a new endocrinologist Will consult the clinical pharmacist to assist with medication management Medications refilled today Patient encouraged to get her diabetes eye exam done Follow-up in 3 months.

## 2022-10-27 NOTE — Assessment & Plan Note (Signed)
Currently on Prozac 40 mg daily, risperidone 2 mg daily, trazodone 300 mg daily , clonazepam 0.5 mg twice daily as needed  Still grieving the loss of her 52-year-old granddaughter. She has been following up regularly with psychiatrist She denies SI, HI Need to maintain close follow-up with psychiatrist discussed. Patient encouraged to call 911 or go to the ER if she feels suicidal Flowsheet Row Office Visit from 10/27/2022 in [facility]  PHQ-9 Total Score 26

## 2022-10-28 ENCOUNTER — Other Ambulatory Visit (HOSPITAL_COMMUNITY): Payer: Self-pay | Admitting: Nurse Practitioner

## 2022-10-28 ENCOUNTER — Telehealth: Payer: Self-pay

## 2022-10-28 ENCOUNTER — Other Ambulatory Visit: Payer: Self-pay | Admitting: Nurse Practitioner

## 2022-10-28 ENCOUNTER — Other Ambulatory Visit: Payer: Self-pay

## 2022-10-28 DIAGNOSIS — Z1231 Encounter for screening mammogram for malignant neoplasm of breast: Secondary | ICD-10-CM

## 2022-10-28 DIAGNOSIS — E039 Hypothyroidism, unspecified: Secondary | ICD-10-CM

## 2022-10-28 LAB — CBC
Hematocrit: 33.3 % — ABNORMAL LOW (ref 34.0–46.6)
Hemoglobin: 11.1 g/dL (ref 11.1–15.9)
MCH: 28.5 pg (ref 26.6–33.0)
MCHC: 33.3 g/dL (ref 31.5–35.7)
MCV: 85 fL (ref 79–97)
Platelets: 207 10*3/uL (ref 150–450)
RBC: 3.9 x10E6/uL (ref 3.77–5.28)
RDW: 12.5 % (ref 11.7–15.4)
WBC: 6.1 10*3/uL (ref 3.4–10.8)

## 2022-10-28 LAB — CMP14+EGFR
ALT: 10 IU/L (ref 0–32)
AST: 14 IU/L (ref 0–40)
Albumin/Globulin Ratio: 1.5 (ref 1.2–2.2)
Albumin: 4.5 g/dL (ref 3.8–4.9)
Alkaline Phosphatase: 88 IU/L (ref 44–121)
BUN/Creatinine Ratio: 15 (ref 9–23)
BUN: 15 mg/dL (ref 6–24)
Bilirubin Total: 0.2 mg/dL (ref 0.0–1.2)
CO2: 20 mmol/L (ref 20–29)
Calcium: 9.5 mg/dL (ref 8.7–10.2)
Chloride: 103 mmol/L (ref 96–106)
Creatinine, Ser: 1.02 mg/dL — ABNORMAL HIGH (ref 0.57–1.00)
Globulin, Total: 3 g/dL (ref 1.5–4.5)
Glucose: 325 mg/dL — ABNORMAL HIGH (ref 70–99)
Potassium: 3.9 mmol/L (ref 3.5–5.2)
Sodium: 142 mmol/L (ref 134–144)
Total Protein: 7.5 g/dL (ref 6.0–8.5)
eGFR: 67 mL/min/{1.73_m2} (ref >59)

## 2022-10-28 LAB — TSH+FREE T4
Free T4: 1.4 ng/dL (ref 0.82–1.77)
TSH: 1.23 u[IU]/mL (ref 0.450–4.500)

## 2022-10-28 MED ORDER — LEVOTHYROXINE SODIUM 75 MCG PO TABS: 75.0000 ug | ORAL_TABLET | Freq: Every day | ORAL | 1 refills | Status: DC

## 2022-10-28 NOTE — Telephone Encounter (Signed)
A prior authorization request was submitted to insurance today via CoverMyMeds KEY: [id number]

## 2022-10-28 NOTE — Telephone Encounter (Signed)
Please advise of alternate. KH

## 2022-10-29 ENCOUNTER — Telehealth: Payer: Self-pay

## 2022-10-29 NOTE — Progress Notes (Signed)
   Care Guide Note  10/29/2022 Name: Kathryn [name] MRN: [id number] DOB: 12-20-1970  Referred by: Donell Beers, FNP Reason for referral : No chief complaint on file.   Kathryn [name] is a 52 y.o. year old female who is a primary care patient of Donell Beers, FNP. Kathryn [name] was referred to the pharmacist for assistance related to DM.    An unsuccessful telephone outreach was attempted today to contact the patient who was referred to the pharmacy team for assistance with medication management. Additional attempts will be made to contact the patient.   Penne Lash, RMA Care Guide [facility]  [place], [place] [address] Direct Dial: [phone] [email or web address]

## 2022-10-29 NOTE — Progress Notes (Signed)
   Care Guide Note  10/29/2022 Name: Kathryn [name] MRN: [id number] DOB: Jul 24, 1970  Referred by: Donell Beers, FNP Reason for referral : Care Coordination (Outreach to schedule with pharm d )   Kathryn [name] is a 52 y.o. year old female who is a primary care patient of Donell Beers, FNP. Kathryn [name] was referred to the pharmacist for assistance related to DM.    Successful contact was made with the patient to discuss pharmacy services including being ready for the pharmacist to call at least 5 minutes before the scheduled appointment time, to have medication bottles and any blood sugar or blood pressure readings ready for review. The patient agreed to meet with the pharmacist via with the pharmacist via telephone visit on (date/time).  11/14/2022  Penne Lash, RMA Care Guide [facility]  [place], [place] [address] Direct Dial: [phone] [email or web address]

## 2022-10-30 NOTE — Telephone Encounter (Signed)
Approved until 10/29/2022

## 2022-11-12 DIAGNOSIS — R1310 Dysphagia, unspecified: Secondary | ICD-10-CM | POA: Diagnosis not present

## 2022-11-12 DIAGNOSIS — K2289 Other specified disease of esophagus: Secondary | ICD-10-CM | POA: Diagnosis not present

## 2022-11-12 DIAGNOSIS — R933 Abnormal findings on diagnostic imaging of other parts of digestive tract: Secondary | ICD-10-CM | POA: Diagnosis not present

## 2022-11-12 DIAGNOSIS — R1319 Other dysphagia: Secondary | ICD-10-CM | POA: Diagnosis not present

## 2022-11-14 ENCOUNTER — Other Ambulatory Visit: Payer: BC Managed Care – PPO | Admitting: Pharmacist

## 2022-11-14 ENCOUNTER — Telehealth: Payer: Self-pay | Admitting: Pharmacist

## 2022-11-14 NOTE — Progress Notes (Unsigned)
Attempted to contact patient for scheduled appointment for medication management. Left HIPAA compliant message for patient to return my call at their convenience.   Will send MyChart  Catie T. Damyen Knoll, PharmD, BCACP, CPP Clinical Pharmacist [facility] [phone]  

## 2022-11-20 ENCOUNTER — Ambulatory Visit (INDEPENDENT_AMBULATORY_CARE_PROVIDER_SITE_OTHER): Payer: BC Managed Care – PPO | Admitting: Psychiatry

## 2022-11-20 ENCOUNTER — Encounter (HOSPITAL_COMMUNITY): Payer: Self-pay | Admitting: Psychiatry

## 2022-11-20 ENCOUNTER — Telehealth: Payer: Self-pay

## 2022-11-20 DIAGNOSIS — F251 Schizoaffective disorder, depressive type: Secondary | ICD-10-CM

## 2022-11-20 DIAGNOSIS — J4540 Moderate persistent asthma, uncomplicated: Secondary | ICD-10-CM

## 2022-11-20 DIAGNOSIS — F4310 Post-traumatic stress disorder, unspecified: Secondary | ICD-10-CM

## 2022-11-20 MED ORDER — BENZTROPINE MESYLATE 1 MG PO TABS: ORAL_TABLET | ORAL | 2 refills | Status: DC

## 2022-11-20 MED ORDER — CLONAZEPAM 0.5 MG PO TABS: 0.5000 mg | ORAL_TABLET | Freq: Two times a day (BID) | ORAL | 0 refills | Status: DC | PRN

## 2022-11-20 MED ORDER — TRAZODONE HCL 100 MG PO TABS: 300.0000 mg | ORAL_TABLET | Freq: Every day | ORAL | 2 refills | Status: DC

## 2022-11-20 MED ORDER — RISPERIDONE 2 MG PO TABS: ORAL_TABLET | ORAL | 3 refills | Status: DC

## 2022-11-20 MED ORDER — FLUOXETINE HCL 40 MG PO CAPS: 40.0000 mg | ORAL_CAPSULE | Freq: Every day | ORAL | 2 refills | Status: DC

## 2022-11-20 NOTE — Progress Notes (Signed)
   Care Guide Note  11/20/2022 Name: Kathryn [name] MRN: [id number] DOB: Dec 23, 1970  Referred by: Donell Beers, FNP Reason for referral : Care Coordination (Outreach to reschedule initial with Pharm d )   Kathryn [name] is a 52 y.o. year old female who is a primary care patient of Donell Beers, FNP. Kathryn [name] was referred to the pharmacist for assistance related to DM.    An unsuccessful telephone outreach was attempted today to contact the patient who was referred to the pharmacy team for assistance with medication management. Additional attempts will be made to contact the patient.   Penne Lash, RMA Care Guide [facility]  [place], [place] [address] Direct Dial: [phone] [email or web address]

## 2022-11-20 NOTE — Progress Notes (Signed)
Virtual Visit via Telephone Note  I connected with Kathryn [name] on 11/20/22 at  9:00 AM EDT by telephone and verified that I am speaking with the correct person using two identifiers.  Location: Patient: home Provider: office   I discussed the limitations, risks, security and privacy concerns of performing an evaluation and management service by telephone and the availability of in person appointments. I also discussed with the patient that there may be a patient responsible charge related to this service. The patient expressed understanding and agreed to proceed.     I discussed the assessment and treatment plan with the patient. The patient was provided an opportunity to ask questions and all were answered. The patient agreed with the plan and demonstrated an understanding of the instructions.   The patient was advised to call back or seek an in-person evaluation if the symptoms worsen or if the condition fails to improve as anticipated.  I provided 15 minutes of non-face-to-face time during this encounter.   Diannia Ruder, MD  [facility] MD/PA/NP OP Progress Note  11/20/2022 9:16 AM Kathryn [name]  MRN:  [id number]  Chief Complaint:  Chief Complaint  Patient presents with   Schizophrenia   Depression   Anxiety   Follow-up   HPI: This patient is a 52 year old black female who lives with her husband in [place]. 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   The patient returns for follow-up after 4 weeks.  Last time she had been more depressed.  She was not happy with her job but today states she has an interview for a different job in which she could work from home.  She and her husband are also getting along better.  She states that currently her daughter is about to give birth and she is at the hospital with her.  The baby is premature but she is gratified to know that they are in good hands.  Last time we changed the patient from  Cymbalta to Prozac.  She states that this is working better and she is less depressed.  She is rarely having hallucinations.  She is sleeping well.  The medication for anxiety has been helpful.  She is no longer concerned or talking about "different personalities."  She denies any thoughts of self-harm or suicide Visit Diagnosis:    ICD-10-CM   1. Schizoaffective disorder, depressive type (HCC)  F25.1     2. PTSD (post-traumatic stress disorder)  F43.10     3. Moderate persistent asthma, unspecified whether complicated  J45.40       Past Psychiatric History: 2 prior hospitalizations for schizoaffective disorder  Past Medical History:  Past Medical History:  Diagnosis Date   Anemia    Anxiety    Asthma    Depression    GERD (gastroesophageal reflux disease)    HLD (hyperlipidemia) 04/07/2019   Hypertension    Hypothyroidism, adult 04/07/2019   Neuropathy    Obesity (BMI 30.0-34.9) 04/07/2019   Schizoaffective disorder (HCC)    Type II diabetes mellitus, uncontrolled 04/27/2019    Past Surgical History:  Procedure Laterality Date   BACK SURGERY  09/06/2020   BIOPSY  05/03/2021   Procedure: BIOPSY;  Surgeon: Dolores Frame, MD;  [facility];  Service: Gastroenterology;;   BREAST SURGERY Right    biopsy   CESAREAN SECTION     CHOLECYSTECTOMY  06/02/2012   Procedure: LAPAROSCOPIC CHOLECYSTECTOMY;  Surgeon: Dalia Heading, MD;  Location: [facility];  Service: General;  Laterality: N/A;  Attempted laparoscopic cholecystectomy   CHOLECYSTECTOMY  06/02/2012   Procedure: CHOLECYSTECTOMY;  Surgeon: Dalia Heading, MD;  Location: [facility];  Service: General;  Laterality: N/A;  converted to open at  0905   COLONOSCOPY WITH PROPOFOL N/A 07/18/2020   prep was fair, one 4mm polyp (inflammatory) stool in descending colon, transverse and ascending, distal rectum and anal verge normal   ESOPHAGEAL DILATION  12/31/2018   Procedure: ESOPHAGEAL DILATION;  Surgeon: Malissa Hippo, MD;  [facility];  Service: Endoscopy;;   ESOPHAGOGASTRODUODENOSCOPY (EGD) WITH PROPOFOL N/A 08/24/2015   Procedure: ESOPHAGOGASTRODUODENOSCOPY (EGD) WITH PROPOFOL;  Surgeon: Malissa Hippo, MD;  [facility];  Service: Endoscopy;  Laterality: N/A;  1:10 - Ann to notify pt to arrive at 11:30   ESOPHAGOGASTRODUODENOSCOPY (EGD) WITH PROPOFOL N/A 12/31/2018   normal, no abnormality to explain dysphagia, esophagus dilated.   ESOPHAGOGASTRODUODENOSCOPY (EGD) WITH PROPOFOL N/A 05/03/2021   Procedure: ESOPHAGOGASTRODUODENOSCOPY (EGD) WITH PROPOFOL;  Surgeon: Dolores Frame, MD;  [facility];  Service: Gastroenterology;  Laterality: N/A;  1:20   FLEXIBLE SIGMOIDOSCOPY  07/17/2020   Procedure: FLEXIBLE SIGMOIDOSCOPY;  Surgeon: Marguerita Merles, Reuel Boom, MD;  [facility];  Service: Gastroenterology;;   POLYPECTOMY  07/18/2020   Procedure: POLYPECTOMY INTESTINAL;  Surgeon: Marguerita Merles, Reuel Boom, MD;  [facility];  Service: Gastroenterology;;  ascending colon polyp;    SAVORY DILATION  05/03/2021   Procedure: SAVORY DILATION;  Surgeon: Dolores Frame, MD;  [facility];  Service: Gastroenterology;;   tooth removal  2019   all teeth removed   TUBAL LIGATION     X3    Family Psychiatric History: See below  Family History:  Family History  Problem Relation Age of Onset   Hypertension Mother    Alcohol abuse Mother    Heart disease Mother    Kidney disease Mother    Aneurysm Mother        brain   Hypertension Father    Alcohol abuse Sister    Alcohol abuse Maternal Grandmother    Alcohol abuse Maternal Grandfather    Hearing loss Daughter    Alcohol abuse Maternal Aunt    Alcohol abuse Paternal Aunt    Alcohol abuse Cousin    Stroke Other    Diabetes Other    Cancer Other    Seizures Other     Social History:  Social History   Socioeconomic History   Marital status: Married    Spouse  name: Kendell Bane   Number of children: 3   Years of education: 12   Highest education level: Not on file  Occupational History    Comment: [employer]    Comment: 3rd shift  Tobacco Use   Smoking status: Never    Passive exposure: Never   Smokeless tobacco: Never  Vaping Use   Vaping Use: Never used  Substance and Sexual Activity   Alcohol use: No    Alcohol/week: 0.0 standard drinks of alcohol   Drug use: No   Sexual activity: Not Currently    Birth control/protection: Surgical  Other Topics Concern   Not on file  Social History Narrative   Married for 22 years.On disability secondary to schizophrenia.   Lives with husband.   Caffeine- decaf coffee, tea   Social Determinants of Health   Financial Resource Strain: Not on file  Food Insecurity: Not on file  Transportation Needs: Not  on file  Physical Activity: Not on file  Stress: Not on file  Social Connections: Not on file    Allergies:  Allergies  Allergen Reactions   [place] Bread [[place] Cocos]     Metabolic Disorder Labs: Lab Results  Component Value Date   HGBA1C 10.6 (A) 10/27/2022   MPG 332 07/30/2020   MPG  07/19/2019     Comment:     eAG cannot be calculated. Hemoglobin A1c result exceeds the linearity of the assay.    No results found for: "PROLACTIN" Lab Results  Component Value Date   CHOL 123 05/20/2022   TRIG 94 05/20/2022   HDL 60 05/20/2022   CHOLHDL 2.1 05/20/2022   LDLCALC 45 05/20/2022   LDLCALC 39 03/05/2022   Lab Results  Component Value Date   TSH 1.230 10/27/2022   TSH 1.030 05/20/2022    Therapeutic Level Labs: Lab Results  Component Value Date   LITHIUM <0.06 (L) 07/03/2016   LITHIUM 1.17 09/09/2015   No results found for: "VALPROATE" No results found for: "CBMZ"  Current Medications: Current Outpatient Medications  Medication Sig Dispense Refill   albuterol (VENTOLIN HFA) 108 (90 Base) MCG/ACT inhaler Inhale 2 puffs into the lungs every 6 (six) hours as needed for  wheezing or shortness of breath. 8 g 2   amLODipine (NORVASC) 5 MG tablet Take 1 tablet (5 mg total) by mouth daily. 90 tablet 1   aspirin EC 81 MG tablet Take 81 mg by mouth daily.     atorvastatin (LIPITOR) 10 MG tablet Take 1 tablet (10 mg total) by mouth daily. 90 tablet 2   benztropine (COGENTIN) 1 MG tablet TAKE 1 TABLET(1 MG) BY MOUTH AT BEDTIME 90 tablet 2   blood glucose meter kit and supplies Dispense based on patient and insurance preference. Once daily testing DX E11.9 (Patient not taking: Reported on 10/27/2022) 1 each 0   Blood Glucose Monitoring Suppl (ACCU-CHEK GUIDE ME) w/Device KIT 1 Piece by Does not apply route as directed. (Patient not taking: Reported on 10/27/2022) 1 kit 0   budesonide-formoterol (SYMBICORT) 80-4.5 MCG/ACT inhaler INHALE 2 PUFFS INTO THE LUNGS TWICE A DAY 10.2 each 3   carvedilol (COREG) 12.5 MG tablet Take 1 tablet (12.5 mg total) by mouth 2 (two) times daily. 180 tablet 1   Cholecalciferol (VITAMIN D-3) 125 MCG (5000 UT) TABS Take 2 tablets by mouth daily. 30 tablet 1   clonazePAM (KLONOPIN) 0.5 MG tablet Take 1 tablet (0.5 mg total) by mouth 2 (two) times daily as needed for anxiety. 180 tablet 0   Continuous Blood Gluc Receiver (DEXCOM G7 RECEIVER) DEVI USED TO MONITOR BLOOD GLUCOSE DX: E11.65 1 each 0   Continuous Blood Gluc Sensor (DEXCOM G7 SENSOR) MISC Used to monitor blood glucose DX E11.65 1 each 3   Continuous Glucose Sensor (DEXCOM G7 SENSOR) MISC Please use as directed. 3 each 3   Dulaglutide (TRULICITY) 4.5 MG/0.5ML SOPN Inject 4.5 mg as directed once a week. 2 mL 3   FLUoxetine (PROZAC) 40 MG capsule Take 1 capsule (40 mg total) by mouth daily. 30 capsule 2   gabapentin (NEURONTIN) 300 MG capsule Take 1 capsule (300 mg total) by mouth daily. 90 capsule 1   glipiZIDE (GLUCOTROL) 10 MG tablet Take 1 tablet (10 mg total) by mouth 2 (two) times daily before a meal. 60 tablet 3   glucose blood (ACCU-CHEK GUIDE) test strip Use as instructed (Patient not  taking: Reported on 10/27/2022) 150 each 2  HYDROcodone-acetaminophen (NORCO/VICODIN) 5-325 MG tablet Take 1 tablet by mouth every 4 (four) hours as needed. (Patient not taking: Reported on 10/27/2022)     Insulin Glargine Max SoloStar 300 UNIT/ML SOPN INJECT 80 UNITS INTO THE SKIN AT BEDTIME. 1 mL 6   levothyroxine (SYNTHROID) 75 MCG tablet Take 1 tablet (75 mcg total) by mouth daily before breakfast. 90 tablet 1   LINZESS 290 MCG CAPS capsule Take 1 capsule (290 mcg total) by mouth daily. 30 capsule 1   losartan (COZAAR) 100 MG tablet Take 1 tablet (100 mg total) by mouth daily. 90 tablet 1   metoCLOPramide (REGLAN) 5 MG tablet TAKE 1 TABLET BY MOUTH 4 TIMES DAILY. 120 tablet 0   Multiple Vitamin (MULTIVITAMIN) tablet Take 1 tablet by mouth daily.     omeprazole (PRILOSEC) 40 MG capsule Take 1 capsule (40 mg total) by mouth 2 (two) times daily. 180 capsule 3   ondansetron (ZOFRAN) 4 MG tablet Take 1 tablet (4 mg total) by mouth every 8 (eight) hours as needed for nausea or vomiting. 30 tablet 3   Polyvinyl Alcohol-Povidone PF (REFRESH) 1.4-0.6 % SOLN Place 1 drop into both eyes daily as needed (dry eyes). (Patient not taking: Reported on 10/27/2022)     risperiDONE (RISPERDAL) 2 MG tablet TAKE 1 TABLET EVERY MORNING AND 3 TABLETS AT BEDTIME 360 tablet 3   rizatriptan (MAXALT) 10 MG tablet Take 1 tablet (10 mg total) by mouth as needed for migraine. May repeat in 2 hours if needed 10 tablet 3   sucralfate (CARAFATE) 1 GM/10ML suspension Take 10 mLs (1 g total) by mouth 4 (four) times daily -  with meals and at bedtime. (Patient not taking: Reported on 10/27/2022) 420 mL 1   topiramate (TOPAMAX) 25 MG tablet TAKE 1 TAB AT BEDTIME FOR ONE WEEK, THEN INCREASE TO 2 TABS AT BEDTIME FOR ONE WEEK, THEN 3 TABS AT BEDTIME FOR ONE WEEK, THEN TAKE 4 TABS (100MG ) AT BEDTIME (Patient not taking: Reported on 10/27/2022) 720 tablet 0   traMADol (ULTRAM) 50 MG tablet Take 50 mg by mouth every 6 (six) hours as needed.      traZODone (DESYREL) 100 MG tablet Take 3 tablets (300 mg total) by mouth at bedtime. 270 tablet 2   No current facility-administered medications for this visit.     Musculoskeletal: Strength & Muscle Tone: na Gait & Station: na Patient leans: N/A  Psychiatric Specialty Exam: Review of Systems  All other systems reviewed and are negative.   Last menstrual period 01/23/2013.There is no height or weight on file to calculate BMI.  General Appearance: NA  Eye Contact:  NA  Speech:  Clear and Coherent  Volume:  Normal  Mood:  Euthymic  Affect:  Congruent  Thought Process:  Goal Directed  Orientation:  Full (Time, Place, and Person)  Thought Content: WDL   Suicidal Thoughts:  No  Homicidal Thoughts:  No  Memory:  Immediate;   Good Recent;   Good Remote;   NA  Judgement:  Good  Insight:  Fair  Psychomotor Activity:  Normal  Concentration:  Concentration: Good and Attention Span: Good  Recall:  Fair  Fund of Knowledge: Fair  Language: Good  Akathisia:  No  Handed:  Right  AIMS (if indicated): not done  Assets:  Communication Skills Desire for Improvement Resilience Social Support Talents/Skills  ADL's:  Intact  Cognition: WNL  Sleep:  Good   Screenings: GAD-7    Flowsheet Row Office Visit from 10/27/2022 in  [facility] Office Visit from 10/23/2022 in [facility] [place] Office Visit from 06/23/2022 in [facility] Office Visit from 05/20/2022 in [facility] Office Visit from 03/05/2022 in [facility]  Total GAD-7 Score 18 20 3 19 21       PHQ2-9    Flowsheet Row Office Visit from 10/27/2022 in [facility] Office Visit from 10/23/2022 in [facility] at [place] Office Visit from 06/23/2022 in [facility] Office Visit from 05/20/2022 in [facility] Primary Care Office  Visit from 03/19/2022 in [facility]  PHQ-2 Total Score 6 5 0 3 1  PHQ-9 Total Score 26 25 2 17  --      Flowsheet Row Office Visit from 10/23/2022 in [facility] at [place] Video Visit from 08/16/2021 in [facility] at [place] ED from 07/17/2021 in [facility] Emergency Department at [facility]  C-SSRS RISK CATEGORY No Risk No Risk No Risk        Assessment and Plan: This patient is a 53 year old female with a history of schizoaffective disorder and PTSD.  She is doing better on her current regimen.  She will continue Prozac 40 mg daily for depression, Risperdal 2 mg in the morning and 6 mg in the evening for auditory hallucinations and paranoia.  She will continue trazodone 300 mg at bedtime for sleep and clonazepam 0.5 mg twice daily for anxiety.  She will return to see me in 3 months  Collaboration of Care: Collaboration of Care: Referral or follow-up with counselor/therapist AEB patient has been referred to therapist Florencia Reasons in our office  Patient/Guardian was advised Release of Information must be obtained prior to any record release in order to collaborate their care with an outside provider. Patient/Guardian was advised if they have not already done so to contact the registration department to sign all necessary forms in order for [place] to release information regarding their care.   Consent: Patient/Guardian gives verbal consent for treatment and assignment of benefits for services provided during this visit. Patient/Guardian expressed understanding and agreed to proceed.    Diannia Ruder, MD 11/20/2022, 9:16 AM

## 2022-11-21 ENCOUNTER — Telehealth (INDEPENDENT_AMBULATORY_CARE_PROVIDER_SITE_OTHER): Payer: Self-pay | Admitting: Gastroenterology

## 2022-11-21 NOTE — Telephone Encounter (Signed)
I received the results of the most recent esophageal manometry performed at [facility].  The esophageal manometry showed an elevated IRP of 18.2 with 5 swallows with DCI less than 450.  There was abnormal bolus clearance with rapid drink Challenge and multiple rapid swallows.  This was significant for IEM, but also given the elevated IRP this could correspond to concomitant EGJOO versus type I achalasia.  Due to this, it was recommended that the patient undergoes timed barium esophagram and an Endoflip.  Notably, time barium esophagram was performed in March 2024 which showed presence of reflux episodes but no retained column of contrast in her esophagus or esophageal dilation, no masses. She has been off opiates since last year.  Due to this, I discussed with the patient the possibility of having an evaluation at [facility] again for a repeat EGD with Endoflip.  Will refer her to Dr. Marissa Nestle office.  Hi Ann, can you please refer the patient to [facility] - Dr. Ebony Cargo GI department -diagnosis: Dysphagia, IEM, concern for EGJOO versus type I achalasia, need for Endoflip.  Thanks,   Katrinka Blazing, MD Gastroenterology and Hepatology [facility]\

## 2022-11-24 NOTE — Telephone Encounter (Signed)
Referral has been faxed, they will contact patient to schedule

## 2022-11-24 NOTE — Telephone Encounter (Signed)
Does referral need to be for OV or procedure

## 2022-11-24 NOTE — Telephone Encounter (Signed)
Outpatient visit Thanks

## 2022-11-25 NOTE — Progress Notes (Signed)
   Care Guide Note  11/25/2022 Name: [name] MRN: [id number] DOB: 29-May-1970  Referred by: Donell Beers, FNP Reason for referral : Care Coordination (Outreach to reschedule initial with Pharm d )   Kathryn [name] is a 52 y.o. year old female who is a primary care patient of Donell Beers, FNP. Addysin A Maclachlan was referred to the pharmacist for assistance related to DM.    A second unsuccessful telephone outreach was attempted today to contact the patient who was referred to the pharmacy team for assistance with medication management. Additional attempts will be made to contact the patient.  Penne Lash, RMA Care Guide [facility]  [place], [place] [address] Direct Dial: [phone] [email or web address]

## 2022-11-25 NOTE — Progress Notes (Signed)
   Care Guide Note  11/25/2022 Name: Kathryn [name] MRN: [id number] DOB: 11-11-1970  Referred by: Donell Beers, FNP Reason for referral : Care Coordination (Outreach to reschedule initial with Pharm d )   Kathryn [name] is a 52 y.o. year old female who is a primary care patient of Donell Beers, FNP. Kathryn [name] was referred to the pharmacist for assistance related to DM.    Successful contact was made with the patient to discuss pharmacy services including being ready for the pharmacist to call at least 5 minutes before the scheduled appointment time, to have medication bottles and any blood sugar or blood pressure readings ready for review. The patient agreed to meet with the pharmacist via with the pharmacist via telephone visit on (date/time).  01/01/2023  Penne Lash, RMA Care Guide [facility]  [place], [place] [address] Direct Dial: [phone] [email or web address]

## 2022-12-02 DIAGNOSIS — K220 Achalasia of cardia: Secondary | ICD-10-CM | POA: Diagnosis not present

## 2022-12-02 DIAGNOSIS — R1319 Other dysphagia: Secondary | ICD-10-CM | POA: Diagnosis not present

## 2022-12-02 DIAGNOSIS — R1310 Dysphagia, unspecified: Secondary | ICD-10-CM | POA: Diagnosis not present

## 2022-12-11 ENCOUNTER — Ambulatory Visit (HOSPITAL_COMMUNITY): Payer: BC Managed Care – PPO | Admitting: Psychiatry

## 2022-12-22 ENCOUNTER — Ambulatory Visit (HOSPITAL_COMMUNITY): Payer: BC Managed Care – PPO | Admitting: Psychiatry

## 2022-12-22 ENCOUNTER — Encounter (HOSPITAL_COMMUNITY): Payer: Self-pay

## 2022-12-30 ENCOUNTER — Ambulatory Visit
Admission: RE | Admit: 2022-12-30 | Discharge: 2022-12-30 | Disposition: A | Discharge status: Home / Self Care | Payer: BC Managed Care – PPO | Source: Ambulatory Visit | Attending: Nurse Practitioner | Admitting: Nurse Practitioner

## 2022-12-30 DIAGNOSIS — Z1231 Encounter for screening mammogram for malignant neoplasm of breast: Secondary | ICD-10-CM

## 2023-01-01 ENCOUNTER — Other Ambulatory Visit: Payer: BC Managed Care – PPO | Admitting: Pharmacist

## 2023-01-01 ENCOUNTER — Telehealth: Payer: Self-pay

## 2023-01-01 NOTE — Progress Notes (Signed)
Attempted to contact patient for scheduled appointment for medication management. Left HIPAA compliant message for patient to return my call at their convenience.    Nils Pyle, PharmD PGY1 Pharmacy Resident

## 2023-01-07 ENCOUNTER — Other Ambulatory Visit: Payer: Self-pay | Admitting: Psychiatry

## 2023-01-08 NOTE — Telephone Encounter (Signed)
Last seen on 10/08/21 No 4 month follow up scheduled

## 2023-01-09 ENCOUNTER — Telehealth: Payer: Self-pay

## 2023-01-09 NOTE — Progress Notes (Signed)
  Care Coordination Note  01/09/2023 Name: Kathryn [name] MRN: [id number] DOB: [birth year]  Kathryn [name] is a 52 y.o. year old female who is a primary care patient of Donell Beers, FNP and is actively engaged with the Chronic Care Management team. I reached out to Kathryn [name] by phone today to assist with re-scheduling an initial visit with the Pharmacist  Follow up plan: Unsuccessful telephone outreach attempt made. A HIPAA compliant phone message was left for the patient providing contact information and requesting a return call.   Kathryn [name], RMA Care Guide [facility]  [place], [place] [address] Direct Dial: [phone] [email or web address]

## 2023-01-13 NOTE — Progress Notes (Signed)
   Care Guide Note  01/13/2023 Name: Kathryn [name] MRN: [id number] DOB: Nov 02, 1970  Referred by: Donell Beers, FNP Reason for referral : Care Management (Outreach to reschedule with pharm d )   Kathryn [name] is a 52 y.o. year old female who is a primary care patient of Donell Beers, FNP. Kathryn [name] was referred to the pharmacist for assistance related to DM.    A second unsuccessful telephone outreach was attempted today to contact the patient who was referred to the pharmacy team for assistance with medication management. Additional attempts will be made to contact the patient.  Penne Lash, RMA Care Guide [facility]  [place], [place] [address] Direct Dial: [phone] [email or web address]

## 2023-01-21 NOTE — Progress Notes (Signed)
   Care Guide Note  01/21/2023 Name: Kathryn [name] MRN: [id number] DOB: Jul 07, 1970  Referred by: Donell Beers, FNP Reason for referral : Care Management (Outreach to reschedule with pharm d )   Kathryn [name] is a 53 y.o. year old female who is a primary care patient of Donell Beers, FNP. Kathryn [name] was referred to the pharmacist for assistance related to DM.    A third unsuccessful telephone outreach was attempted today to contact the patient who was referred to the pharmacy team for assistance with medication management. The Population Health team is pleased to engage with this patient at any time in the future upon receipt of referral and should he/she be interested in assistance from the [facility] team.   Penne Lash, RMA Care Guide [facility]  [place], [place] [address] Direct Dial: [phone] [email or web address]

## 2023-01-27 ENCOUNTER — Encounter: Payer: Self-pay | Admitting: Nurse Practitioner

## 2023-01-27 ENCOUNTER — Ambulatory Visit (INDEPENDENT_AMBULATORY_CARE_PROVIDER_SITE_OTHER): Payer: BC Managed Care – PPO | Admitting: Nurse Practitioner

## 2023-01-27 ENCOUNTER — Other Ambulatory Visit: Payer: Self-pay | Admitting: Nurse Practitioner

## 2023-01-27 VITALS — BP 134/85 | HR 90 | Temp 98.0°F | Resp 14 | Ht 60.0 in | Wt 158.0 lb

## 2023-01-27 DIAGNOSIS — M79661 Pain in right lower leg: Secondary | ICD-10-CM

## 2023-01-27 DIAGNOSIS — E1165 Type 2 diabetes mellitus with hyperglycemia: Secondary | ICD-10-CM

## 2023-01-27 DIAGNOSIS — G629 Polyneuropathy, unspecified: Secondary | ICD-10-CM | POA: Diagnosis not present

## 2023-01-27 DIAGNOSIS — I10 Essential (primary) hypertension: Secondary | ICD-10-CM

## 2023-01-27 DIAGNOSIS — G43109 Migraine with aura, not intractable, without status migrainosus: Secondary | ICD-10-CM

## 2023-01-27 DIAGNOSIS — R1310 Dysphagia, unspecified: Secondary | ICD-10-CM

## 2023-01-27 DIAGNOSIS — M79662 Pain in left lower leg: Secondary | ICD-10-CM

## 2023-01-27 DIAGNOSIS — Z794 Long term (current) use of insulin: Secondary | ICD-10-CM | POA: Diagnosis not present

## 2023-01-27 LAB — POCT GLYCOSYLATED HEMOGLOBIN (HGB A1C): HbA1c, POC (controlled diabetic range): 7.6 % — AB (ref 0.0–7.0)

## 2023-01-27 MED ORDER — EMPAGLIFLOZIN 10 MG PO TABS: 10.0000 mg | ORAL_TABLET | Freq: Every day | ORAL | 3 refills | Status: DC

## 2023-01-27 MED ORDER — TRULICITY 4.5 MG/0.5ML ~~LOC~~ SOAJ: 4.5000 mg | SUBCUTANEOUS | 3 refills | Status: DC

## 2023-01-27 MED ORDER — GLIPIZIDE 5 MG PO TABS: 5.0000 mg | ORAL_TABLET | Freq: Two times a day (BID) | ORAL | 4 refills | Status: DC

## 2023-01-27 MED ORDER — GABAPENTIN 300 MG PO CAPS
300.0000 mg | ORAL_CAPSULE | Freq: Every day | ORAL | 1 refills | Status: DC
Start: 2023-01-27 — End: 2023-05-05

## 2023-01-27 MED ORDER — TOPIRAMATE 100 MG PO TABS: 100.0000 mg | ORAL_TABLET | Freq: Every day | ORAL | 1 refills | Status: DC

## 2023-01-27 MED ORDER — INSULIN GLARGINE MAX SOLOSTAR 300 UNIT/ML ~~LOC~~ SOPN: 80.0000 [IU] | PEN_INJECTOR | Freq: Every day | SUBCUTANEOUS | 6 refills | Status: DC

## 2023-01-27 NOTE — Assessment & Plan Note (Signed)
Continue Topamax 100 mg at bedtime Takes rizatriptan as needed Medication refilled

## 2023-01-27 NOTE — Assessment & Plan Note (Addendum)
Lab Results  Component Value Date   HGBA1C 7.6 (A) 01/27/2023  A1c much improved, patient congratulated on her efforts at getting her diabetes under control, goal is A1c of less than 7. Continue Trulicity 4.5 mg once weekly, start Jardiance 10 mg daily, decrease glipizide to 5 mg twice daily to prevent hypoglycemia, all long-acting insulin, take Tresiba 80 units daily Patient counseled on low-carb modified diet Encouraged to get her diabetes eye exam done. She was referred to the clinical pharmacist to assist with medication management but he was unable to reach her, I have contacted him and advised him to try calling the patient early in the morning, states that she is only available early in the morning. Follow-up in 4 weeks  Check fasting lipid panel in 4 weeks, currently on atorvastatin 10 mg daily

## 2023-01-27 NOTE — Assessment & Plan Note (Signed)
BP Readings from Last 3 Encounters:  01/27/23 134/85  10/27/22 131/79  08/07/22 111/78  Slightly elevated no changes made to medications today Continue amlodipine 10 mg daily, carvedilol 25 mg twice daily, losartan 100 mg daily  Continue current medications. No changes in management. Discussed DASH diet and dietary sodium restrictions Continue to increase dietary efforts and exercise.

## 2023-01-27 NOTE — Assessment & Plan Note (Signed)
Gabapentin 300 mg daily refilled

## 2023-01-27 NOTE — Patient Instructions (Signed)
Goal for fasting blood sugar ranges from 80 to 120 and 2 hours after any meal or at bedtime should be between 130 to 170.   1. Uncontrolled type 2 diabetes mellitus with hyperglycemia, with long-term current use of insulin (HCC)  - POCT glycosylated hemoglobin (Hb A1C) - empagliflozin (JARDIANCE) 10 MG TABS tablet; Take 1 tablet (10 mg total) by mouth daily before breakfast.  Dispense: 30 tablet; Refill: 3 - glipiZIDE (GLUCOTROL) 5 MG tablet; Take 1 tablet (5 mg total) by mouth 2 (two) times daily before a meal.  Dispense: 60 tablet; Refill: 4 - Dulaglutide (TRULICITY) 4.5 MG/0.5ML SOPN; Inject 4.5 mg as directed once a week.  Dispense: 2 mL; Refill: 3 - Insulin Glargine Max SoloStar 300 UNIT/ML SOPN; Inject 80 Units into the skin at bedtime.  Dispense: 1 mL; Refill: 6 - Microalbumin / creatinine urine ratio  2. Migraine with aura and without status migrainosus, not intractable  - topiramate (TOPAMAX) 100 MG tablet; Take 1 tablet (100 mg total) by mouth at bedtime.  Dispense: 90 tablet; Refill: 1  3. Need for immunization against influenza   4. Bilateral calf pain   5. Polyneuropathy  - gabapentin (NEURONTIN) 300 MG capsule; Take 1 capsule (300 mg total) by mouth daily.  Dispense: 90 capsule; Refill: 1    It is important that you exercise regularly at least 30 minutes 5 times a week as tolerated  Think about what you will eat, plan ahead. Choose " clean, green, fresh or frozen" over canned, processed or packaged foods which are more sugary, salty and fatty. 70 to 75% of food eaten should be vegetables and fruit. Three meals at set times with snacks allowed between meals, but they must be fruit or vegetables. Aim to eat over a 12 hour period , example 7 am to 7 pm, and STOP after  your last meal of the day. Drink water,generally about 64 ounces per day, no other drink is as healthy. Fruit juice is best enjoyed in a healthy way, by EATING the fruit.  [facility] for [facility] we consider it a privelige to serve you.

## 2023-01-27 NOTE — Assessment & Plan Note (Signed)
Patient encouraged to maintain close follow-up with GI Encouraged to chew food well before swallowing to prevent choking

## 2023-01-27 NOTE — Assessment & Plan Note (Signed)
Has history of type 2 diabetes and hyperlipidemia Will refer to vascular to screen for PAD

## 2023-01-27 NOTE — Progress Notes (Signed)
Established Patient Office Visit  Subjective:  Patient ID: Kathryn [name], female    DOB: 1971/04/17  Age: 52 y.o. MRN: [id number]  CC:  Chief Complaint  Patient presents with   Follow-up    HPI Kathryn [name] is a 52 y.o. female  has a past medical history of Anemia, Anxiety, Asthma, Depression, GERD (gastroesophageal reflux disease), HLD (hyperlipidemia) (04/07/2019), Hypertension, Hypothyroidism, adult (04/07/2019), Neuropathy, Obesity (BMI 30.0-34.9) (04/07/2019), Schizoaffective disorder (HCC), and Type II diabetes mellitus, uncontrolled (04/27/2019).  Patient presents for follow-up for her chronic medical conditions   Hypertension.  Currently on amlodipine 5 mg daily, carvedilol 12.5 mg twice daily, losartan 100 mg daily.  Stated that she has been taking all medications daily as ordered.  No complaints of chest pain dizziness edema   Uncontrolled type 2 diabetes.  Currently on glipizide 10 mg twice daily, Toujeo 80 units daily at bedtime, Trulicity 4.5 mg once weekly injection.  Has a Dexcom in place.  She denies hypoglycemia.  She ran out of Toujeo and glipizide about a week ago.  States that she has upcoming diabetic eye exam, sometimes has pain in her bilateral calf area when walking.   She has been following up with a GI specialist for her dysphagia.  Encouraged to chew her food well before swallowing to prevent choking.   Patient denies any adverse reactions to her current medications.    Past Medical History:  Diagnosis Date   Anemia    Anxiety    Asthma    Depression    GERD (gastroesophageal reflux disease)    HLD (hyperlipidemia) 04/07/2019   Hypertension    Hypothyroidism, adult 04/07/2019   Neuropathy    Obesity (BMI 30.0-34.9) 04/07/2019   Schizoaffective disorder (HCC)    Type II diabetes mellitus, uncontrolled 04/27/2019    Past Surgical History:  Procedure Laterality Date   BACK SURGERY  09/06/2020   BIOPSY  05/03/2021   Procedure: BIOPSY;   Surgeon: Dolores Frame, MD;  Location: [facility];  Service: Gastroenterology;;   BREAST SURGERY Right    biopsy   CESAREAN SECTION     CHOLECYSTECTOMY  06/02/2012   Procedure: LAPAROSCOPIC CHOLECYSTECTOMY;  Surgeon: Dalia Heading, MD;  Location: [facility];  Service: General;  Laterality: N/A;  Attempted laparoscopic cholecystectomy   CHOLECYSTECTOMY  06/02/2012   Procedure: CHOLECYSTECTOMY;  Surgeon: Dalia Heading, MD;  Location: [facility];  Service: General;  Laterality: N/A;  converted to open at  9562   COLONOSCOPY WITH PROPOFOL N/A 07/18/2020   prep was fair, one 4mm polyp (inflammatory) stool in descending colon, transverse and ascending, distal rectum and anal verge normal   ESOPHAGEAL DILATION  12/31/2018   Procedure: ESOPHAGEAL DILATION;  Surgeon: Malissa Hippo, MD;  Location: [facility];  Service: Endoscopy;;   ESOPHAGOGASTRODUODENOSCOPY (EGD) WITH PROPOFOL N/A 08/24/2015   Procedure: ESOPHAGOGASTRODUODENOSCOPY (EGD) WITH PROPOFOL;  Surgeon: Malissa Hippo, MD;  Location: [facility];  Service: Endoscopy;  Laterality: N/A;  1:10 - Ann to notify pt to arrive at 11:30   ESOPHAGOGASTRODUODENOSCOPY (EGD) WITH PROPOFOL N/A 12/31/2018   normal, no abnormality to explain dysphagia, esophagus dilated.   ESOPHAGOGASTRODUODENOSCOPY (EGD) WITH PROPOFOL N/A 05/03/2021   Procedure: ESOPHAGOGASTRODUODENOSCOPY (EGD) WITH PROPOFOL;  Surgeon: Dolores Frame, MD;  [facility]: [facility];  Service: Gastroenterology;  Laterality: N/A;  1:20   FLEXIBLE SIGMOIDOSCOPY  07/17/2020   Procedure: FLEXIBLE SIGMOIDOSCOPY;  Surgeon: Dolores Frame, MD;  [facility]: [facility];  Service: Gastroenterology;;  POLYPECTOMY  07/18/2020   Procedure: POLYPECTOMY INTESTINAL;  Surgeon: Marguerita Merles, Reuel Boom, MD;  Location: [facility];  Service: Gastroenterology;;  ascending colon polyp;    SAVORY DILATION  05/03/2021   Procedure: SAVORY DILATION;  Surgeon: Marguerita Merles, Reuel Boom, MD;  Location: [facility];  Service: Gastroenterology;;   tooth removal  2019   all teeth removed   TUBAL LIGATION     X3    Family History  Problem Relation Age of Onset   Hypertension Mother    Alcohol abuse Mother    Heart disease Mother    Kidney disease Mother    Aneurysm Mother        brain   Hypertension Father    Alcohol abuse Sister    Alcohol abuse Maternal Grandmother    Alcohol abuse Maternal Grandfather    Hearing loss Daughter    Alcohol abuse Maternal Aunt    Alcohol abuse Paternal Aunt    Alcohol abuse Cousin    Stroke Other    Diabetes Other    Cancer Other    Seizures Other     Social History   Socioeconomic History   Marital status: Married    Spouse name: Kendell Bane   Number of children: 3   Years of education: 12   Highest education level: Not on file  Occupational History    Comment: [employer]    Comment: 3rd shift  Tobacco Use   Smoking status: Never    Passive exposure: Never   Smokeless tobacco: Never  Vaping Use   Vaping status: Never Used  Substance and Sexual Activity   Alcohol use: No    Alcohol/week: 0.0 standard drinks of alcohol   Drug use: No   Sexual activity: Not Currently    Birth control/protection: Surgical  Other Topics Concern   Not on file  Social History Narrative   Married for 22 years.On disability secondary to schizophrenia.   Lives with husband.   Caffeine- decaf coffee, tea   Social Determinants of Health   Financial Resource Strain: Not on file  Food Insecurity: Not on file  Transportation Needs: Not on file  Physical Activity: Not on file  Stress: Not on file  Social Connections: Not on file  Intimate Partner Violence: Not on file    Outpatient Medications Prior to Visit  Medication Sig Dispense Refill   albuterol (VENTOLIN HFA) 108 (90 Base) MCG/ACT inhaler Inhale 2 puffs into the lungs every 6 (six) hours as needed for wheezing or shortness of breath. 8 g 2   amLODipine (NORVASC) 5 MG  tablet Take 1 tablet (5 mg total) by mouth daily. 90 tablet 1   aspirin EC 81 MG tablet Take 81 mg by mouth daily.     atorvastatin (LIPITOR) 10 MG tablet Take 1 tablet (10 mg total) by mouth daily. 90 tablet 2   benztropine (COGENTIN) 1 MG tablet TAKE 1 TABLET(1 MG) BY MOUTH AT BEDTIME 90 tablet 2   budesonide-formoterol (SYMBICORT) 80-4.5 MCG/ACT inhaler INHALE 2 PUFFS INTO THE LUNGS TWICE A DAY 10.2 each 3   carvedilol (COREG) 12.5 MG tablet Take 1 tablet (12.5 mg total) by mouth 2 (two) times daily. 180 tablet 1   Cholecalciferol (VITAMIN D-3) 125 MCG (5000 UT) TABS Take 2 tablets by mouth daily. 30 tablet 1   clonazePAM (KLONOPIN) 0.5 MG tablet Take 1 tablet (0.5 mg total) by mouth 2 (two) times daily as needed for anxiety. 180 tablet 0   Continuous Blood Gluc Receiver (  DEXCOM G7 RECEIVER) DEVI USED TO MONITOR BLOOD GLUCOSE DX: E11.65 1 each 0   Continuous Blood Gluc Sensor (DEXCOM G7 SENSOR) MISC Used to monitor blood glucose DX E11.65 1 each 3   Continuous Glucose Sensor (DEXCOM G7 SENSOR) MISC Please use as directed. 3 each 3   FLUoxetine (PROZAC) 40 MG capsule Take 1 capsule (40 mg total) by mouth daily. 30 capsule 2   levothyroxine (SYNTHROID) 75 MCG tablet Take 1 tablet (75 mcg total) by mouth daily before breakfast. 90 tablet 1   LINZESS 290 MCG CAPS capsule Take 1 capsule (290 mcg total) by mouth daily. 30 capsule 1   losartan (COZAAR) 100 MG tablet Take 1 tablet (100 mg total) by mouth daily. 90 tablet 1   metoCLOPramide (REGLAN) 5 MG tablet TAKE 1 TABLET BY MOUTH 4 TIMES DAILY. 120 tablet 0   Multiple Vitamin (MULTIVITAMIN) tablet Take 1 tablet by mouth daily.     omeprazole (PRILOSEC) 40 MG capsule Take 1 capsule (40 mg total) by mouth 2 (two) times daily. 180 capsule 3   risperiDONE (RISPERDAL) 2 MG tablet TAKE 1 TABLET EVERY MORNING AND 3 TABLETS AT BEDTIME 360 tablet 3   rizatriptan (MAXALT) 10 MG tablet Take 1 tablet (10 mg total) by mouth as needed for migraine. May repeat in  2 hours if needed 10 tablet 3   traMADol (ULTRAM) 50 MG tablet Take 50 mg by mouth every 6 (six) hours as needed.     traZODone (DESYREL) 100 MG tablet Take 3 tablets (300 mg total) by mouth at bedtime. 270 tablet 2   Dulaglutide (TRULICITY) 4.5 MG/0.5ML SOPN Inject 4.5 mg as directed once a week. 2 mL 3   glipiZIDE (GLUCOTROL) 10 MG tablet Take 1 tablet (10 mg total) by mouth 2 (two) times daily before a meal. 60 tablet 3   Insulin Glargine Max SoloStar 300 UNIT/ML SOPN INJECT 80 UNITS INTO THE SKIN AT BEDTIME. 1 mL 6   blood glucose meter kit and supplies Dispense based on patient and insurance preference. Once daily testing DX E11.9 (Patient not taking: Reported on 10/27/2022) 1 each 0   Blood Glucose Monitoring Suppl (ACCU-CHEK GUIDE ME) w/Device KIT 1 Piece by Does not apply route as directed. (Patient not taking: Reported on 10/27/2022) 1 kit 0   glucose blood (ACCU-CHEK GUIDE) test strip Use as instructed (Patient not taking: Reported on 10/27/2022) 150 each 2   Polyvinyl Alcohol-Povidone PF (REFRESH) 1.4-0.6 % SOLN Place 1 drop into both eyes daily as needed (dry eyes). (Patient not taking: Reported on 10/27/2022)     sucralfate (CARAFATE) 1 GM/10ML suspension Take 10 mLs (1 g total) by mouth 4 (four) times daily -  with meals and at bedtime. (Patient not taking: Reported on 10/27/2022) 420 mL 1   gabapentin (NEURONTIN) 300 MG capsule Take 1 capsule (300 mg total) by mouth daily. 90 capsule 1   HYDROcodone-acetaminophen (NORCO/VICODIN) 5-325 MG tablet Take 1 tablet by mouth every 4 (four) hours as needed. (Patient not taking: Reported on 10/27/2022)     ondansetron (ZOFRAN) 4 MG tablet Take 1 tablet (4 mg total) by mouth every 8 (eight) hours as needed for nausea or vomiting. (Patient not taking: Reported on 01/27/2023) 30 tablet 3   topiramate (TOPAMAX) 25 MG tablet TAKE 1 TAB AT BEDTIME FOR ONE WEEK, THEN INCREASE TO 2 TABS AT BEDTIME FOR ONE WEEK, THEN 3 TABS AT BEDTIME FOR ONE WEEK, THEN TAKE 4 TABS  (100MG ) AT BEDTIME (Patient not taking: Reported  on 10/27/2022) 720 tablet 0   No facility-administered medications prior to visit.    Allergies  Allergen Reactions   [place] Bread [Wolfiporia Cocos]     ROS Review of Systems  Constitutional:  Negative for activity change, appetite change, chills, fatigue and fever.  HENT:  Negative for congestion, dental problem, ear discharge and ear pain.   Respiratory:  Negative for cough, chest tightness, shortness of breath and wheezing.   Cardiovascular:  Negative for chest pain, palpitations and leg swelling.  Gastrointestinal:  Negative for abdominal distention, abdominal pain, anal bleeding, blood in stool and constipation.  Endocrine: Negative for polydipsia, polyphagia and polyuria.  Genitourinary:  Negative for difficulty urinating, dysuria, flank pain, frequency, hematuria, menstrual problem, pelvic pain and vaginal bleeding.  Musculoskeletal:  Negative for arthralgias, back pain, gait problem and joint swelling.  Skin:  Negative for color change, pallor, rash and wound.  Neurological:  Negative for dizziness, tremors, facial asymmetry, weakness and headaches.  Hematological:  Negative for adenopathy. Does not bruise/bleed easily.  Psychiatric/Behavioral:  Negative for agitation, behavioral problems, confusion, decreased concentration, hallucinations, self-injury and suicidal ideas.       Objective:    Physical Exam Vitals and nursing note reviewed.  Constitutional:      General: She is not in acute distress.    Appearance: Normal appearance. She is not ill-appearing, toxic-appearing or diaphoretic.  HENT:     Mouth/Throat:     Mouth: Mucous membranes are moist.     Pharynx: Oropharynx is clear. No oropharyngeal exudate or posterior oropharyngeal erythema.  Eyes:     General: No scleral icterus.       Right eye: No discharge.        Left eye: No discharge.     Extraocular Movements: Extraocular movements intact.      Conjunctiva/sclera: Conjunctivae normal.  Cardiovascular:     Rate and Rhythm: Normal rate and regular rhythm.     Pulses: Normal pulses.     Heart sounds: Normal heart sounds. No murmur heard.    No friction rub. No gallop.  Pulmonary:     Effort: Pulmonary effort is normal. No respiratory distress.     Breath sounds: Normal breath sounds. No stridor. No wheezing, rhonchi or rales.  Chest:     Chest wall: No tenderness.  Abdominal:     General: There is no distension.     Palpations: Abdomen is soft.     Tenderness: There is no abdominal tenderness. There is no right CVA tenderness, left CVA tenderness or guarding.  Musculoskeletal:        General: No swelling, tenderness, deformity or signs of injury.     Right lower leg: No edema.     Left lower leg: No edema.  Skin:    General: Skin is warm and dry.     Capillary Refill: Capillary refill takes less than 2 seconds.     Coloration: Skin is not jaundiced or pale.     Findings: No bruising, erythema or lesion.  Neurological:     Mental Status: She is alert and oriented to person, place, and time.     Motor: No weakness.     Coordination: Coordination normal.     Gait: Gait normal.  Psychiatric:        Mood and Affect: Mood normal.        Behavior: Behavior normal.        Thought Content: Thought content normal.        Judgment: Judgment  normal.     BP 134/85 (BP Location: Right Arm, Patient Position: Sitting, Cuff Size: Normal)   Pulse 90   Temp 98 F (36.7 C)   Resp 14   Ht 5' (1.524 m)   Wt 158 lb (71.7 kg)   LMP 01/23/2013   SpO2 98%   BMI 30.86 kg/m  Wt Readings from Last 3 Encounters:  01/27/23 158 lb (71.7 kg)  10/27/22 157 lb (71.2 kg)  08/07/22 153 lb 4.8 oz (69.5 kg)    Lab Results  Component Value Date   TSH 1.230 10/27/2022   Lab Results  Component Value Date   WBC 6.1 10/27/2022   HGB 11.1 10/27/2022   HCT 33.3 (L) 10/27/2022   MCV 85 10/27/2022   PLT 207 10/27/2022   Lab Results   Component Value Date   NA 142 10/27/2022   K 3.9 10/27/2022   CO2 20 10/27/2022   GLUCOSE 325 (H) 10/27/2022   BUN 15 10/27/2022   CREATININE 1.02 (H) 10/27/2022   BILITOT 0.2 10/27/2022   ALKPHOS 88 10/27/2022   AST 14 10/27/2022   ALT 10 10/27/2022   PROT 7.5 10/27/2022   ALBUMIN 4.5 10/27/2022   CALCIUM 9.5 10/27/2022   ANIONGAP 7 07/17/2021   EGFR 67 10/27/2022   Lab Results  Component Value Date   CHOL 123 05/20/2022   Lab Results  Component Value Date   HDL 60 05/20/2022   Lab Results  Component Value Date   LDLCALC 45 05/20/2022   Lab Results  Component Value Date   TRIG 94 05/20/2022   Lab Results  Component Value Date   CHOLHDL 2.1 05/20/2022   Lab Results  Component Value Date   HGBA1C 7.6 (A) 01/27/2023      Assessment & Plan:   Problem List Items Addressed This Visit       Cardiovascular and Mediastinum   Essential hypertension, benign    BP Readings from Last 3 Encounters:  01/27/23 134/85  10/27/22 131/79  08/07/22 111/78  Slightly elevated no changes made to medications today Continue amlodipine 10 mg daily, carvedilol 25 mg twice daily, losartan 100 mg daily  Continue current medications. No changes in management. Discussed DASH diet and dietary sodium restrictions Continue to increase dietary efforts and exercise.         Migraine    Continue Topamax 100 mg at bedtime Takes rizatriptan as needed Medication refilled      Relevant Medications   topiramate (TOPAMAX) 100 MG tablet   gabapentin (NEURONTIN) 300 MG capsule     Digestive   Dysphagia    Patient encouraged to maintain close follow-up with GI Encouraged to chew food well before swallowing to prevent choking        Endocrine   Uncontrolled type 2 diabetes mellitus with hyperglycemia, with long-term current use of insulin (HCC) - Primary    Lab Results  Component Value Date   HGBA1C 7.6 (A) 01/27/2023  A1c much improved, patient congratulated on her efforts at  getting her diabetes under control, goal is A1c of less than 7. Continue Trulicity 4.5 mg once weekly, start Jardiance 10 mg daily, decrease glipizide to 5 mg twice daily to prevent hypoglycemia, all long-acting insulin, take Tresiba 80 units daily Patient counseled on low-carb modified diet Encouraged to get her diabetes eye exam done. She was referred to the clinical pharmacist to assist with medication management but he was unable to reach her, I have contacted him and advised him to try  calling the patient early in the morning, states that she is only available early in the morning. Follow-up in 4 weeks  Check fasting lipid panel in 4 weeks, currently on atorvastatin 10 mg daily      Relevant Medications   empagliflozin (JARDIANCE) 10 MG TABS tablet   glipiZIDE (GLUCOTROL) 5 MG tablet   Dulaglutide (TRULICITY) 4.5 MG/0.5ML SOPN   Other Relevant Orders   POCT glycosylated hemoglobin (Hb A1C) (Completed)   Microalbumin / creatinine urine ratio     Nervous and Auditory   Polyneuropathy    Gabapentin 300 mg daily refilled      Relevant Medications   topiramate (TOPAMAX) 100 MG tablet   gabapentin (NEURONTIN) 300 MG capsule     Other   Bilateral calf pain    Has history of type 2 diabetes and hyperlipidemia Will refer to vascular to screen for PAD       Relevant Orders   Ambulatory referral to Vascular Surgery    Meds ordered this encounter  Medications   empagliflozin (JARDIANCE) 10 MG TABS tablet    Sig: Take 1 tablet (10 mg total) by mouth daily before breakfast.    Dispense:  30 tablet    Refill:  3   glipiZIDE (GLUCOTROL) 5 MG tablet    Sig: Take 1 tablet (5 mg total) by mouth 2 (two) times daily before a meal.    Dispense:  60 tablet    Refill:  4   Dulaglutide (TRULICITY) 4.5 MG/0.5ML SOPN    Sig: Inject 4.5 mg as directed once a week.    Dispense:  2 mL    Refill:  3   topiramate (TOPAMAX) 100 MG tablet    Sig: Take 1 tablet (100 mg total) by mouth at  bedtime.    Dispense:  90 tablet    Refill:  1   DISCONTD: Insulin Glargine Max SoloStar 300 UNIT/ML SOPN    Sig: Inject 80 Units into the skin at bedtime.    Dispense:  1 mL    Refill:  6   gabapentin (NEURONTIN) 300 MG capsule    Sig: Take 1 capsule (300 mg total) by mouth daily.    Dispense:  90 capsule    Refill:  1    Follow-up: Return in about 4 weeks (around 02/24/2023) for DM, CPE.    Donell Beers, FNP

## 2023-01-28 LAB — MICROALBUMIN / CREATININE URINE RATIO
Creatinine, Urine: 74 mg/dL
Microalb/Creat Ratio: 7 mg/g{creat} (ref 0–29)
Microalbumin, Urine: 5.2 ug/mL

## 2023-02-10 ENCOUNTER — Ambulatory Visit (INDEPENDENT_AMBULATORY_CARE_PROVIDER_SITE_OTHER): Payer: BC Managed Care – PPO | Admitting: Gastroenterology

## 2023-02-12 ENCOUNTER — Telehealth (HOSPITAL_COMMUNITY): Payer: Self-pay | Admitting: *Deleted

## 2023-02-12 NOTE — Telephone Encounter (Signed)
Opened in Error.

## 2023-02-16 ENCOUNTER — Ambulatory Visit (HOSPITAL_COMMUNITY): Payer: BC Managed Care – PPO | Admitting: Psychiatry

## 2023-02-24 ENCOUNTER — Encounter: Payer: Self-pay | Admitting: Nurse Practitioner

## 2023-02-24 ENCOUNTER — Ambulatory Visit (INDEPENDENT_AMBULATORY_CARE_PROVIDER_SITE_OTHER): Payer: BC Managed Care – PPO | Admitting: Nurse Practitioner

## 2023-02-24 VITALS — BP 108/80 | HR 89 | Resp 16 | Ht 59.25 in | Wt 154.2 lb

## 2023-02-24 DIAGNOSIS — Z794 Long term (current) use of insulin: Secondary | ICD-10-CM

## 2023-02-24 DIAGNOSIS — H9312 Tinnitus, left ear: Secondary | ICD-10-CM | POA: Insufficient documentation

## 2023-02-24 DIAGNOSIS — G43009 Migraine without aura, not intractable, without status migrainosus: Secondary | ICD-10-CM | POA: Diagnosis not present

## 2023-02-24 DIAGNOSIS — K589 Irritable bowel syndrome without diarrhea: Secondary | ICD-10-CM

## 2023-02-24 DIAGNOSIS — E1165 Type 2 diabetes mellitus with hyperglycemia: Secondary | ICD-10-CM

## 2023-02-24 DIAGNOSIS — Z114 Encounter for screening for human immunodeficiency virus [HIV]: Secondary | ICD-10-CM

## 2023-02-24 DIAGNOSIS — E785 Hyperlipidemia, unspecified: Secondary | ICD-10-CM

## 2023-02-24 DIAGNOSIS — R112 Nausea with vomiting, unspecified: Secondary | ICD-10-CM

## 2023-02-24 DIAGNOSIS — I10 Essential (primary) hypertension: Secondary | ICD-10-CM

## 2023-02-24 DIAGNOSIS — Z0000 Encounter for general adult medical examination without abnormal findings: Secondary | ICD-10-CM | POA: Insufficient documentation

## 2023-02-24 DIAGNOSIS — Z23 Encounter for immunization: Secondary | ICD-10-CM

## 2023-02-24 MED ORDER — METOCLOPRAMIDE HCL 5 MG PO TABS: 5.0000 mg | ORAL_TABLET | Freq: Four times a day (QID) | ORAL | 0 refills | Status: DC | PRN

## 2023-02-24 MED ORDER — GLIPIZIDE 5 MG PO TABS
10.0000 mg | ORAL_TABLET | Freq: Two times a day (BID) | ORAL | 4 refills | Status: DC
Start: 2023-02-24 — End: 2023-04-28

## 2023-02-24 MED ORDER — RIZATRIPTAN BENZOATE 10 MG PO TABS
10.0000 mg | ORAL_TABLET | ORAL | 0 refills | Status: DC | PRN
Start: 2023-02-24 — End: 2023-10-23

## 2023-02-24 MED ORDER — LINZESS 290 MCG PO CAPS
290.0000 ug | ORAL_CAPSULE | Freq: Every day | ORAL | 1 refills | Status: AC
Start: 2023-02-24 — End: ?

## 2023-02-24 NOTE — Assessment & Plan Note (Signed)
Refilled  - LINZESS 290 MCG CAPS capsule; Take 1 capsule (290 mcg total) by mouth daily.  Dispense: 90 capsule; Refill: 1

## 2023-02-24 NOTE — Patient Instructions (Addendum)
     [facility]  [phone]    1. Irritable bowel syndrome, unspecified type  - LINESS 290 MCG CAPS capsule; Take 1 capsule (290 mcg total) by mouth daily.  Dispense: 90 capsule; Refill: 1  2. Migraine without aura and without status migrainosus, not intractable  - rizatriptan (MAXALT) 10 MG tablet; Take 1 tablet (10 mg total) by mouth as needed for migraine. May repeat in 2 hours if needed  Dispense: 10 tablet; Refill: 0  3. Nausea and vomiting, unspecified vomiting type  - metoCLOPramide (REGLAN) 5 MG tablet; Take 1 tablet (5 mg total) by mouth every 6 (six) hours as needed for nausea.  Dispense: 40 tablet; Refill: 0  4. Need for influenza vaccination  - Flu vaccine trivalent PF, 6mos and older(Flulaval,Afluria,Fluarix,Fluzone)  5. Tinnitus aurium, left  - Ambulatory referral to ENT    It is important that you exercise regularly at least 30 minutes 5 times a week as tolerated  Think about what you will eat, plan ahead. Choose " clean, green, fresh or frozen" over canned, processed or packaged foods which are more sugary, salty and fatty. 70 to 75% of food eaten should be vegetables and fruit. Three meals at set times with snacks allowed between meals, but they must be fruit or vegetables. Aim to eat over a 12 hour period , example 7 am to 7 pm, and STOP after  your last meal of the day. Drink water,generally about 64 ounces per day, no other drink is as healthy. Fruit juice is best enjoyed in a healthy way, by EATING the fruit.  Thanks for choosing [facility] we consider it a privelige to serve you.

## 2023-02-24 NOTE — Assessment & Plan Note (Signed)
Patient educated on CDC recommendation for the vaccine. Verbal consent was obtained from the patient, vaccine administered by nurse, no sign of adverse reactions noted at this time. Patient education on arm soreness and use of tylenol  for this patient  was discussed. Patient educated on the signs and symptoms of adverse effect and advise to contact the office if they occur. Vaccine information sheet given to patient.  

## 2023-02-24 NOTE — Assessment & Plan Note (Addendum)
Lab Results  Component Value Date   HGBA1C 7.6 (A) 01/27/2023  She did not start Jardiance due to cost of medication Continue Tresiba 80 units at bedtime, Trulicity 4.5 mg once weekly injection increase glipizide back to 10 mg twice daily Has upcoming appointment with endocrinologist, encouraged to keep the upcoming appointment Patient counseled on low-carb modified diet, engage in regular moderate exercises at least 150 minutes weekly as tolerated Follow-up in 4 months

## 2023-02-24 NOTE — Assessment & Plan Note (Signed)
refilled - rizatriptan (MAXALT) 10 MG tablet; Take 1 tablet (10 mg total) by mouth as needed for migraine. May repeat in 2 hours if needed  Dispense: 10 tablet; Refill: 0

## 2023-02-24 NOTE — Assessment & Plan Note (Signed)
Annual exam as documented.  Counseling done include healthy lifestyle involving committing to 150 minutes of exercise per week, heart healthy diet, and attaining healthy weight. The importance of adequate sleep also discussed.  Regular use of seat belt and home safety were also discussed . Changes in health habits are decided on by patient with goals and time frames set for achieving them. Immunization and cancer screening  needs are specifically addressed at this visit.    Up to date with mammogram, PAP, colon cancer screening , TDAP vaccine, shingles vaccine. Flu vaccine given today

## 2023-02-24 NOTE — Assessment & Plan Note (Signed)
Has  constant ringing sensation in the left ear, no c/o of hearing loss  Will refer to ENT

## 2023-02-24 NOTE — Assessment & Plan Note (Signed)
Checking lipid panel today currently on atorvastatin 10 mg daily

## 2023-02-24 NOTE — Assessment & Plan Note (Addendum)
BP Readings from Last 3 Encounters:  02/24/23 108/80  01/27/23 134/85  10/27/22 131/79   HTN Controlled .Continue amlodipine 10 mg daily, carvedilol 25 mg twice daily, losartan 100 mg daily  Continue current medications. No changes in management. Discussed DASH diet and dietary sodium restrictions Continue to increase dietary efforts and exercise.

## 2023-02-24 NOTE — Progress Notes (Signed)
Complete physical exam  Patient: Kathryn [name]   DOB: [birth year]   52 y.o. Female  MRN: [id number]  Subjective:    Chief Complaint  Patient presents with   Annual Exam    Last pap Oct 2022, last A1c 01/27/23    Kathryn [name] is a 52 y.o. female  has a past medical history of Anemia, Anxiety, Asthma, Depression, GERD (gastroesophageal reflux disease), HLD (hyperlipidemia) (04/07/2019), Hypertension, Hypothyroidism, adult (04/07/2019), Neuropathy, Obesity (BMI 30.0-34.9) (04/07/2019), Schizoaffective disorder (HCC), and Type II diabetes mellitus, uncontrolled (04/27/2019). who presents today for a complete physical exam. She reports consuming a low carb diet , does a lot of walking excercises   She generally feels well. She reports sleeping well.      Most recent fall risk assessment:    01/27/2023   10:13 AM  Fall Risk   Falls in the past year? 0  Number falls in past yr: 0  Injury with Fall? 0     Most recent depression screenings:    02/24/2023   10:57 AM 01/27/2023   10:13 AM  PHQ 2/9 Scores  PHQ - 2 Score 0 0  PHQ- 9 Score 0 0        Patient Care Team: Donell Beers, FNP as PCP - General (Nurse Practitioner) Antoine Poche, MD as PCP - Cardiology (Cardiology) Myrlene Broker, MD as Consulting Physician [facility]) Alden Hipp, RPH-CPP (Pharmacist)   Outpatient Medications Prior to Visit  Medication Sig   albuterol (VENTOLIN HFA) 108 (90 Base) MCG/ACT inhaler Inhale 2 puffs into the lungs every 6 (six) hours as needed for wheezing or shortness of breath.   amLODipine (NORVASC) 5 MG tablet Take 1 tablet (5 mg total) by mouth daily.   aspirin EC 81 MG tablet Take 81 mg by mouth daily.   atorvastatin (LIPITOR) 10 MG tablet Take 1 tablet (10 mg total) by mouth daily.   benztropine (COGENTIN) 1 MG tablet TAKE 1 TABLET(1 MG) BY MOUTH AT BEDTIME   blood glucose meter kit and supplies Dispense based on patient and insurance preference. Once  daily testing DX E11.9   Blood Glucose Monitoring Suppl (ACCU-CHEK GUIDE ME) w/Device KIT 1 Piece by Does not apply route as directed.   budesonide-formoterol (SYMBICORT) 80-4.5 MCG/ACT inhaler INHALE 2 PUFFS INTO THE LUNGS TWICE A DAY   carvedilol (COREG) 12.5 MG tablet Take 1 tablet (12.5 mg total) by mouth 2 (two) times daily.   Cholecalciferol (VITAMIN D-3) 125 MCG (5000 UT) TABS Take 2 tablets by mouth daily.   clonazePAM (KLONOPIN) 0.5 MG tablet Take 1 tablet (0.5 mg total) by mouth 2 (two) times daily as needed for anxiety.   Continuous Blood Gluc Receiver (DEXCOM G7 RECEIVER) DEVI USED TO MONITOR BLOOD GLUCOSE DX: E11.65   Continuous Blood Gluc Sensor (DEXCOM G7 SENSOR) MISC Used to monitor blood glucose DX E11.65   Continuous Glucose Sensor (DEXCOM G7 SENSOR) MISC Please use as directed.   Dulaglutide (TRULICITY) 4.5 MG/0.5ML SOPN Inject 4.5 mg as directed once a week.   FLUoxetine (PROZAC) 40 MG capsule Take 1 capsule (40 mg total) by mouth daily.   gabapentin (NEURONTIN) 300 MG capsule Take 1 capsule (300 mg total) by mouth daily.   insulin degludec (TRESIBA FLEXTOUCH) 100 UNIT/ML FlexTouch Pen Inject 80 Units into the skin at bedtime.   levothyroxine (SYNTHROID) 75 MCG tablet Take 1 tablet (75 mcg total) by mouth daily before breakfast.   losartan (COZAAR) 100 MG tablet Take 1  tablet (100 mg total) by mouth daily.   Multiple Vitamin (MULTIVITAMIN) tablet Take 1 tablet by mouth daily.   omeprazole (PRILOSEC) 40 MG capsule Take 1 capsule (40 mg total) by mouth 2 (two) times daily.   risperiDONE (RISPERDAL) 2 MG tablet TAKE 1 TABLET EVERY MORNING AND 3 TABLETS AT BEDTIME   topiramate (TOPAMAX) 100 MG tablet Take 1 tablet (100 mg total) by mouth at bedtime.   traMADol (ULTRAM) 50 MG tablet Take 50 mg by mouth every 6 (six) hours as needed.   traZODone (DESYREL) 100 MG tablet Take 3 tablets (300 mg total) by mouth at bedtime.   [DISCONTINUED] glipiZIDE (GLUCOTROL) 5 MG tablet Take 1  tablet (5 mg total) by mouth 2 (two) times daily before a meal.   [DISCONTINUED] LINZESS 290 MCG CAPS capsule Take 1 capsule (290 mcg total) by mouth daily.   [DISCONTINUED] metoCLOPramide (REGLAN) 5 MG tablet TAKE 1 TABLET BY MOUTH 4 TIMES DAILY.   [DISCONTINUED] rizatriptan (MAXALT) 10 MG tablet Take 1 tablet (10 mg total) by mouth as needed for migraine. May repeat in 2 hours if needed   empagliflozin (JARDIANCE) 10 MG TABS tablet Take 1 tablet (10 mg total) by mouth daily before breakfast. (Patient not taking: Reported on 02/24/2023)   glucose blood (ACCU-CHEK GUIDE) test strip Use as instructed (Patient not taking: Reported on 10/27/2022)   Polyvinyl Alcohol-Povidone PF (REFRESH) 1.4-0.6 % SOLN Place 1 drop into both eyes daily as needed (dry eyes). (Patient not taking: Reported on 10/27/2022)   sucralfate (CARAFATE) 1 GM/10ML suspension Take 10 mLs (1 g total) by mouth 4 (four) times daily -  with meals and at bedtime. (Patient not taking: Reported on 10/27/2022)   No facility-administered medications prior to visit.    Review of Systems  Constitutional: Negative.  Negative for activity change, appetite change, chills and diaphoresis.  HENT:  Positive for tinnitus. Negative for congestion, dental problem, drooling and ear discharge.   Eyes:  Negative for pain, discharge and itching.  Respiratory: Negative.  Negative for apnea, choking and chest tightness.   Cardiovascular: Negative.  Negative for chest pain, palpitations and leg swelling.  Gastrointestinal: Negative.  Negative for abdominal distention, abdominal pain and anal bleeding.  Endocrine: Negative.  Negative for polydipsia, polyphagia and polyuria.  Genitourinary: Negative.  Negative for difficulty urinating, dyspareunia, dysuria and enuresis.  Musculoskeletal: Negative.  Negative for joint swelling, myalgias, neck pain and neck stiffness.  Skin: Negative.  Negative for color change, pallor and rash.  Allergic/Immunologic: Negative.    Neurological: Negative.  Negative for dizziness, seizures and facial asymmetry.  Psychiatric/Behavioral: Negative.  Negative for agitation, behavioral problems, confusion and suicidal ideas.        Objective:     BP 108/80   Pulse 89   Resp 16   Ht 4' 11.25" (1.505 m)   Wt 154 lb 3.2 oz (69.9 kg)   LMP 01/23/2013   SpO2 100%   BMI 30.88 kg/m    Physical Exam Vitals and nursing note reviewed.  Constitutional:      General: She is not in acute distress.    Appearance: Normal appearance. She is not ill-appearing, toxic-appearing or diaphoretic.  HENT:     Right Ear: Tympanic membrane, ear canal and external ear normal. There is no impacted cerumen.     Left Ear: Tympanic membrane, ear canal and external ear normal. There is no impacted cerumen.     Ears:     Comments: Small blood noted in the left  ear canal     Mouth/Throat:     Mouth: Mucous membranes are moist.     Pharynx: Oropharynx is clear. No oropharyngeal exudate or posterior oropharyngeal erythema.  Eyes:     General: No scleral icterus.       Right eye: No discharge.        Left eye: No discharge.     Extraocular Movements: Extraocular movements intact.     Conjunctiva/sclera: Conjunctivae normal.  Neck:     Vascular: No carotid bruit.  Cardiovascular:     Rate and Rhythm: Normal rate and regular rhythm.     Pulses: Normal pulses.     Heart sounds: Normal heart sounds. No murmur heard.    No friction rub. No gallop.  Pulmonary:     Effort: Pulmonary effort is normal. No respiratory distress.     Breath sounds: Normal breath sounds. No stridor. No wheezing, rhonchi or rales.  Chest:     Chest wall: No tenderness.  Abdominal:     General: There is no distension.     Palpations: Abdomen is soft. There is no mass.     Tenderness: There is no abdominal tenderness. There is no right CVA tenderness, left CVA tenderness or guarding.  Musculoskeletal:        General: No swelling, tenderness, deformity or signs  of injury.     Cervical back: Normal range of motion and neck supple. No rigidity or tenderness.     Right lower leg: No edema.     Left lower leg: No edema.  Lymphadenopathy:     Cervical: No cervical adenopathy.  Skin:    General: Skin is warm and dry.     Capillary Refill: Capillary refill takes less than 2 seconds.     Coloration: Skin is not jaundiced or pale.     Findings: No bruising, erythema or lesion.  Neurological:     Mental Status: She is alert and oriented to person, place, and time.     Motor: No weakness.     Coordination: Coordination normal.     Gait: Gait normal.     Deep Tendon Reflexes: Reflexes normal.  Psychiatric:        Mood and Affect: Mood normal.        Behavior: Behavior normal.        Thought Content: Thought content normal.        Judgment: Judgment normal.     No results found for any visits on 02/24/23.     Assessment & Plan:    Routine Health Maintenance and Physical Exam  Immunization History  Administered Date(s) Administered   Influenza Split 06/03/2012   Influenza, Seasonal, Injecte, Preservative Fre 02/24/2023   Influenza,inj,Quad PF,6+ Mos 04/07/2019, 03/15/2020, 07/01/2021, 01/28/2022   Moderna Sars-Covid-2 Vaccination 09/02/2019, 09/30/2019   Pneumococcal Polysaccharide-23 06/03/2012   Tdap 05/24/2014   Zoster Recombinant(Shingrix) 07/01/2021, 10/31/2021    Health Maintenance  Topic Date Due   HIV Screening  Never done   OPHTHALMOLOGY EXAM  09/21/2022   COVID-19 Vaccine (3 - 2023-24 season) 01/25/2023   Colonoscopy  07/19/2023   HEMOGLOBIN A1C  07/27/2023   Diabetic kidney evaluation - eGFR measurement  10/27/2023   MAMMOGRAM  12/30/2023   Diabetic kidney evaluation - Urine ACR  01/27/2024   FOOT EXAM  01/27/2024   Cervical Cancer Screening (HPV/Pap Cotest)  05/09/2024   DTaP/Tdap/Td (2 - Td or Tdap) 05/24/2024   INFLUENZA VACCINE  Completed   Hepatitis C Screening  Completed  Zoster Vaccines- Shingrix  Completed    HPV VACCINES  Aged Out    Discussed health benefits of physical activity, and encouraged her to engage in regular exercise appropriate for her age and condition.  Problem List Items Addressed This Visit       Cardiovascular and Mediastinum   Essential hypertension, benign    BP Readings from Last 3 Encounters:  02/24/23 108/80  01/27/23 134/85  10/27/22 131/79   HTN Controlled .Continue amlodipine 10 mg daily, carvedilol 25 mg twice daily, losartan 100 mg daily  Continue current medications. No changes in management. Discussed DASH diet and dietary sodium restrictions Continue to increase dietary efforts and exercise.         Relevant Orders   CMP14+EGFR   Migraine    refilled - rizatriptan (MAXALT) 10 MG tablet; Take 1 tablet (10 mg total) by mouth as needed for migraine. May repeat in 2 hours if needed  Dispense: 10 tablet; Refill: 0       Relevant Medications   rizatriptan (MAXALT) 10 MG tablet     Digestive   IBS (irritable bowel syndrome)    Refilled  - LINZESS 290 MCG CAPS capsule; Take 1 capsule (290 mcg total) by mouth daily.  Dispense: 90 capsule; Refill: 1        Relevant Medications   LINZESS 290 MCG CAPS capsule   metoCLOPramide (REGLAN) 5 MG tablet     Endocrine   Uncontrolled type 2 diabetes mellitus with hyperglycemia, with long-term current use of insulin (HCC)    Lab Results  Component Value Date   HGBA1C 7.6 (A) 01/27/2023  She did not start Jardiance due to cost of medication Continue Tresiba 80 units at bedtime, Trulicity 4.5 mg once weekly injection increase glipizide back to 10 mg twice daily Has upcoming appointment with endocrinologist, encouraged to keep the upcoming appointment Patient counseled on low-carb modified diet, engage in regular moderate exercises at least 150 minutes weekly as tolerated Follow-up in 4 months      Relevant Medications   glipiZIDE (GLUCOTROL) 5 MG tablet   Other Relevant Orders   Lipid panel     Other    Dyslipidemia, goal LDL below 70    Checking lipid panel today currently on atorvastatin 10 mg daily      Need for influenza vaccination    Patient educated on CDC recommendation for the vaccine. Verbal consent was obtained from the patient, vaccine administered by nurse, no sign of adverse reactions noted at this time. Patient education on arm soreness and use of tylenol  for this patient  was discussed. Patient educated on the signs and symptoms of adverse effect and advise to contact the office if they occur. Vaccine information sheet given to patient.        Relevant Orders   Flu vaccine trivalent PF, 6mos and older(Flulaval,Afluria,Fluarix,Fluzone) (Completed)   Tinnitus aurium, left    Has  constant ringing sensation in the left ear, no c/o of hearing loss  Will refer to ENT       Relevant Orders   Ambulatory referral to ENT   Annual physical exam - Primary    Annual exam as documented.  Counseling done include healthy lifestyle involving committing to 150 minutes of exercise per week, heart healthy diet, and attaining healthy weight. The importance of adequate sleep also discussed.  Regular use of seat belt and home safety were also discussed . Changes in health habits are decided on by patient with goals and time  frames set for achieving them. Immunization and cancer screening  needs are specifically addressed at this visit.    Up to date with mammogram, PAP, colon cancer screening , TDAP vaccine, shingles vaccine. Flu vaccine given today       Other Visit Diagnoses     Nausea and vomiting, unspecified vomiting type       Relevant Medications   metoCLOPramide (REGLAN) 5 MG tablet   Screening for HIV (human immunodeficiency virus)       Relevant Orders   HIV Antibody (routine testing w rflx)      Return in about 4 months (around 06/27/2023).     Donell Beers, FNP

## 2023-02-25 ENCOUNTER — Other Ambulatory Visit: Payer: Self-pay

## 2023-02-26 LAB — HIV ANTIBODY (ROUTINE TESTING W REFLEX): HIV Screen 4th Generation wRfx: NONREACTIVE

## 2023-02-26 LAB — CMP14+EGFR
ALT: 13 [IU]/L (ref 0–32)
AST: 10 [IU]/L (ref 0–40)
Albumin: 4.6 g/dL (ref 3.8–4.9)
Alkaline Phosphatase: 94 [IU]/L (ref 44–121)
BUN/Creatinine Ratio: 14 (ref 9–23)
BUN: 14 mg/dL (ref 6–24)
Bilirubin Total: 0.4 mg/dL (ref 0.0–1.2)
CO2: 21 mmol/L (ref 20–29)
Calcium: 9.6 mg/dL (ref 8.7–10.2)
Chloride: 103 mmol/L (ref 96–106)
Creatinine, Ser: 1.02 mg/dL — ABNORMAL HIGH (ref 0.57–1.00)
Globulin, Total: 3 g/dL (ref 1.5–4.5)
Glucose: 116 mg/dL — ABNORMAL HIGH (ref 70–99)
Potassium: 3.7 mmol/L (ref 3.5–5.2)
Sodium: 138 mmol/L (ref 134–144)
Total Protein: 7.6 g/dL (ref 6.0–8.5)
eGFR: 66 mL/min/{1.73_m2} (ref >59)

## 2023-02-26 LAB — LIPID PANEL
Chol/HDL Ratio: 2 {ratio} (ref 0.0–4.4)
Cholesterol, Total: 98 mg/dL — ABNORMAL LOW (ref 100–199)
HDL: 48 mg/dL (ref >39)
LDL Chol Calc (NIH): 35 mg/dL (ref 0–99)
Triglycerides: 71 mg/dL (ref 0–149)
VLDL Cholesterol Cal: 15 mg/dL (ref 5–40)

## 2023-02-27 ENCOUNTER — Telehealth (HOSPITAL_COMMUNITY): Payer: BC Managed Care – PPO | Admitting: Psychiatry

## 2023-03-10 ENCOUNTER — Telehealth (INDEPENDENT_AMBULATORY_CARE_PROVIDER_SITE_OTHER): Payer: BC Managed Care – PPO | Admitting: Psychiatry

## 2023-03-10 ENCOUNTER — Encounter (HOSPITAL_COMMUNITY): Payer: Self-pay | Admitting: Psychiatry

## 2023-03-10 DIAGNOSIS — F251 Schizoaffective disorder, depressive type: Secondary | ICD-10-CM | POA: Diagnosis not present

## 2023-03-10 DIAGNOSIS — F4310 Post-traumatic stress disorder, unspecified: Secondary | ICD-10-CM | POA: Diagnosis not present

## 2023-03-10 MED ORDER — BENZTROPINE MESYLATE 1 MG PO TABS: ORAL_TABLET | ORAL | 2 refills | Status: DC

## 2023-03-10 MED ORDER — FLUOXETINE HCL 40 MG PO CAPS: 40.0000 mg | ORAL_CAPSULE | Freq: Every day | ORAL | 2 refills | Status: DC

## 2023-03-10 MED ORDER — RISPERIDONE 2 MG PO TABS: ORAL_TABLET | ORAL | 3 refills | Status: DC

## 2023-03-10 MED ORDER — CLONAZEPAM 0.5 MG PO TABS: 0.5000 mg | ORAL_TABLET | Freq: Two times a day (BID) | ORAL | 0 refills | Status: DC | PRN

## 2023-03-10 MED ORDER — TRAZODONE HCL 100 MG PO TABS: 300.0000 mg | ORAL_TABLET | Freq: Every day | ORAL | 2 refills | Status: DC

## 2023-03-10 NOTE — Progress Notes (Signed)
Virtual Visit via Telephone Note  I connected with Kathryn [name] on 03/10/23 at  1:20 PM EDT by telephone and verified that I am speaking with the correct person using two identifiers.  Location: Patient: home Provider: office   I discussed the limitations, risks, security and privacy concerns of performing an evaluation and management service by telephone and the availability of in person appointments. I also discussed with the patient that there may be a patient responsible charge related to this service. The patient expressed understanding and agreed to proceed.      I discussed the assessment and treatment plan with the patient. The patient was provided an opportunity to ask questions and all were answered. The patient agreed with the plan and demonstrated an understanding of the instructions.   The patient was advised to call back or seek an in-person evaluation if the symptoms worsen or if the condition fails to improve as anticipated.  I provided 15 minutes of non-face-to-face time during this encounter.   Diannia Ruder, MD  [facility] MD/PA/NP OP Progress Note  03/10/2023 1:51 PM Kathryn [name]  MRN:  [id number]  Chief Complaint:  Chief Complaint  Patient presents with   Depression   Schizophrenia   Anxiety   Follow-up   HPI: This patient is a 52 year old black female who lives with her husband in [place].  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  The patient returns for follow-up after 4 months.  She states overall she is doing okay.  She is less depressed than she had been before.  She still at her same job of trying to get on somewhere else.  She has a new grandson son and that makes her happy.  She is still worried about the court case involving one of her grandchildren who was murdered.  This is ongoing.  The patient states that she is no longer having hallucinations unless she loses sleep.  However currently she is sleeping  well with the trazodone.  She denies any thoughts of self-harm or suicide.  The medication for anxiety has been helpful.  Her diabetes is under better control and lab work looks good.  Her A1c is down to 7.6 Visit Diagnosis:    ICD-10-CM   1. Schizoaffective disorder, depressive type (HCC)  F25.1       Past Psychiatric History: 2 past hospitalizations for schizoaffective disorder  Past Medical History:  Past Medical History:  Diagnosis Date   Anemia    Anxiety    Asthma    Depression    GERD (gastroesophageal reflux disease)    HLD (hyperlipidemia) 04/07/2019   Hypertension    Hypothyroidism, adult 04/07/2019   Neuropathy    Obesity (BMI 30.0-34.9) 04/07/2019   Schizoaffective disorder (HCC)    Type II diabetes mellitus, uncontrolled 04/27/2019    Past Surgical History:  Procedure Laterality Date   BACK SURGERY  09/06/2020   BIOPSY  05/03/2021   Procedure: BIOPSY;  Surgeon: Dolores Frame, MD;  [facility];  Service: Gastroenterology;;   BREAST SURGERY Right    biopsy   CESAREAN SECTION     CHOLECYSTECTOMY  06/02/2012   Procedure: LAPAROSCOPIC CHOLECYSTECTOMY;  Surgeon: Dalia Heading, MD;  Location: [facility];  Service: General;  Laterality: N/A;  Attempted laparoscopic cholecystectomy   CHOLECYSTECTOMY  06/02/2012   Procedure: CHOLECYSTECTOMY;  Surgeon: Dalia Heading, MD;  Location: [facility];  Service: General;  Laterality: N/A;  converted to  open at  0905   COLONOSCOPY WITH PROPOFOL N/A 07/18/2020   prep was fair, one 4mm polyp (inflammatory) stool in descending colon, transverse and ascending, distal rectum and anal verge normal   ESOPHAGEAL DILATION  12/31/2018   Procedure: ESOPHAGEAL DILATION;  Surgeon: Malissa Hippo, MD;  [facility];  Service: Endoscopy;;   ESOPHAGOGASTRODUODENOSCOPY (EGD) WITH PROPOFOL N/A 08/24/2015   Procedure: ESOPHAGOGASTRODUODENOSCOPY (EGD) WITH PROPOFOL;  Surgeon: Malissa Hippo, MD;  [facility];   Service: Endoscopy;  Laterality: N/A;  1:10 - Ann to notify pt to arrive at 11:30   ESOPHAGOGASTRODUODENOSCOPY (EGD) WITH PROPOFOL N/A 12/31/2018   normal, no abnormality to explain dysphagia, esophagus dilated.   ESOPHAGOGASTRODUODENOSCOPY (EGD) WITH PROPOFOL N/A 05/03/2021   Procedure: ESOPHAGOGASTRODUODENOSCOPY (EGD) WITH PROPOFOL;  Surgeon: Dolores Frame, MD;  [facility];  Service: Gastroenterology;  Laterality: N/A;  1:20   FLEXIBLE SIGMOIDOSCOPY  07/17/2020   Procedure: FLEXIBLE SIGMOIDOSCOPY;  Surgeon: Marguerita Merles, Reuel Boom, MD;  [facility];  Service: Gastroenterology;;   POLYPECTOMY  07/18/2020   Procedure: POLYPECTOMY INTESTINAL;  Surgeon: Marguerita Merles, Reuel Boom, MD;  [facility];  Service: Gastroenterology;;  ascending colon polyp;    SAVORY DILATION  05/03/2021   Procedure: SAVORY DILATION;  Surgeon: Dolores Frame, MD;  [facility];  Service: Gastroenterology;;   tooth removal  2019   all teeth removed   TUBAL LIGATION     X3    Family Psychiatric History: See below  Family History:  Family History  Problem Relation Age of Onset   Hypertension Mother    Alcohol abuse Mother    Heart disease Mother    Kidney disease Mother    Aneurysm Mother        brain   Hypertension Father    Alcohol abuse Sister    Alcohol abuse Maternal Aunt    Cancer Maternal Aunt    Alcohol abuse Paternal Aunt    Alcohol abuse Maternal Grandmother    Alcohol abuse Maternal Grandfather    Hearing loss Daughter    Alcohol abuse Cousin    Stroke Other    Diabetes Other    Cancer Other    Seizures Other     Social History:  Social History   Socioeconomic History   Marital status: Married    Spouse name: Kendell Bane   Number of children: 3   Years of education: 12   Highest education level: Not on file  Occupational History    Comment: [employer]    Comment: 3rd shift  Tobacco Use   Smoking status: Never    Passive  exposure: Never   Smokeless tobacco: Never  Vaping Use   Vaping status: Never Used  Substance and Sexual Activity   Alcohol use: No    Alcohol/week: 0.0 standard drinks of alcohol   Drug use: No   Sexual activity: Not Currently    Birth control/protection: Surgical  Other Topics Concern   Not on file  Social History Narrative   Married for 22 years.On disability secondary to schizophrenia.   Lives with husband.   Caffeine- decaf coffee, tea   Social Determinants of Health   Financial Resource Strain: Not on file  Food Insecurity: Not on file  Transportation Needs: Not on file  Physical Activity: Not on file  Stress: Not on file  Social Connections: Not on file    Allergies:  Allergies  Allergen Reactions   [place] Bread [[place] Coc[place]]  PATIENT DENIES    Metabolic Disorder Labs: Lab Results  Component Value Date   HGBA1C 7.6 (A) 01/27/2023   MPG 332 07/30/2020   MPG  07/19/2019     Comment:     eAG cannot be calculated. Hemoglobin A1c result exceeds the linearity of the assay.    No results found for: "PROLACTIN" Lab Results  Component Value Date   CHOL 98 (L) 02/25/2023   TRIG 71 02/25/2023   HDL 48 02/25/2023   CHOLHDL 2.0 02/25/2023   LDLCALC 35 02/25/2023   LDLCALC 45 05/20/2022   Lab Results  Component Value Date   TSH 1.230 10/27/2022   TSH 1.030 05/20/2022    Therapeutic Level Labs: Lab Results  Component Value Date   LITHIUM <0.06 (L) 07/03/2016   LITHIUM 1.17 09/09/2015   No results found for: "VALPROATE" No results found for: "CBMZ"  Current Medications: Current Outpatient Medications  Medication Sig Dispense Refill   albuterol (VENTOLIN HFA) 108 (90 Base) MCG/ACT inhaler Inhale 2 puffs into the lungs every 6 (six) hours as needed for wheezing or shortness of breath. 8 g 2   amLODipine (NORVASC) 5 MG tablet Take 1 tablet (5 mg total) by mouth daily. 90 tablet 1   aspirin EC 81 MG tablet Take 81 mg by mouth daily.      atorvastatin (LIPITOR) 10 MG tablet Take 1 tablet (10 mg total) by mouth daily. 90 tablet 2   benztropine (COGENTIN) 1 MG tablet TAKE 1 TABLET(1 MG) BY MOUTH AT BEDTIME 90 tablet 2   blood glucose meter kit and supplies Dispense based on patient and insurance preference. Once daily testing DX E11.9 1 each 0   Blood Glucose Monitoring Suppl (ACCU-CHEK GUIDE ME) w/Device KIT 1 Piece by Does not apply route as directed. 1 kit 0   budesonide-formoterol (SYMBICORT) 80-4.5 MCG/ACT inhaler INHALE 2 PUFFS INTO THE LUNGS TWICE A DAY 10.2 each 3   carvedilol (COREG) 12.5 MG tablet Take 1 tablet (12.5 mg total) by mouth 2 (two) times daily. 180 tablet 1   Cholecalciferol (VITAMIN D-3) 125 MCG (5000 UT) TABS Take 2 tablets by mouth daily. 30 tablet 1   clonazePAM (KLONOPIN) 0.5 MG tablet Take 1 tablet (0.5 mg total) by mouth 2 (two) times daily as needed for anxiety. 180 tablet 0   Continuous Blood Gluc Receiver (DEXCOM G7 RECEIVER) DEVI USED TO MONITOR BLOOD GLUCOSE DX: E11.65 1 each 0   Continuous Blood Gluc Sensor (DEXCOM G7 SENSOR) MISC Used to monitor blood glucose DX E11.65 1 each 3   Continuous Glucose Sensor (DEXCOM G7 SENSOR) MISC Please use as directed. 3 each 3   Dulaglutide (TRULICITY) 4.5 MG/0.5ML SOPN Inject 4.5 mg as directed once a week. 2 mL 3   empagliflozin (JARDIANCE) 10 MG TABS tablet Take 1 tablet (10 mg total) by mouth daily before breakfast. (Patient not taking: Reported on 02/24/2023) 30 tablet 3   FLUoxetine (PROZAC) 40 MG capsule Take 1 capsule (40 mg total) by mouth daily. 30 capsule 2   gabapentin (NEURONTIN) 300 MG capsule Take 1 capsule (300 mg total) by mouth daily. 90 capsule 1   glipiZIDE (GLUCOTROL) 5 MG tablet Take 2 tablets (10 mg total) by mouth 2 (two) times daily before a meal. 120 tablet 4   glucose blood (ACCU-CHEK GUIDE) test strip Use as instructed (Patient not taking: Reported on 10/27/2022) 150 each 2   insulin degludec (TRESIBA FLEXTOUCH) 100 UNIT/ML FlexTouch Pen  Inject 80 Units into the skin at bedtime.  15 mL 3   levothyroxine (SYNTHROID) 75 MCG tablet Take 1 tablet (75 mcg total) by mouth daily before breakfast. 90 tablet 1   LINZESS 290 MCG CAPS capsule Take 1 capsule (290 mcg total) by mouth daily. 90 capsule 1   losartan (COZAAR) 100 MG tablet Take 1 tablet (100 mg total) by mouth daily. 90 tablet 1   metoCLOPramide (REGLAN) 5 MG tablet Take 1 tablet (5 mg total) by mouth every 6 (six) hours as needed for nausea. 40 tablet 0   Multiple Vitamin (MULTIVITAMIN) tablet Take 1 tablet by mouth daily.     omeprazole (PRILOSEC) 40 MG capsule Take 1 capsule (40 mg total) by mouth 2 (two) times daily. 180 capsule 3   Polyvinyl Alcohol-Povidone PF (REFRESH) 1.4-0.6 % SOLN Place 1 drop into both eyes daily as needed (dry eyes). (Patient not taking: Reported on 10/27/2022)     risperiDONE (RISPERDAL) 2 MG tablet TAKE 1 TABLET EVERY MORNING AND 3 TABLETS AT BEDTIME 360 tablet 3   rizatriptan (MAXALT) 10 MG tablet Take 1 tablet (10 mg total) by mouth as needed for migraine. May repeat in 2 hours if needed 10 tablet 0   sucralfate (CARAFATE) 1 GM/10ML suspension Take 10 mLs (1 g total) by mouth 4 (four) times daily -  with meals and at bedtime. (Patient not taking: Reported on 10/27/2022) 420 mL 1   topiramate (TOPAMAX) 100 MG tablet Take 1 tablet (100 mg total) by mouth at bedtime. 90 tablet 1   traMADol (ULTRAM) 50 MG tablet Take 50 mg by mouth every 6 (six) hours as needed.     traZODone (DESYREL) 100 MG tablet Take 3 tablets (300 mg total) by mouth at bedtime. 270 tablet 2   No current facility-administered medications for this visit.     Musculoskeletal: Strength & Muscle Tone: na Gait & Station: na Patient leans: N/A  Psychiatric Specialty Exam: Review of Systems  All other systems reviewed and are negative.   Last menstrual period 01/23/2013.There is no height or weight on file to calculate BMI.  General Appearance: NA  Eye Contact:  NA  Speech:  Clear  and Coherent  Volume:  Normal  Mood:  Euthymic  Affect:  NA  Thought Process:  Goal Directed  Orientation:  Full (Time, Place, and Person)  Thought Content: Rumination   Suicidal Thoughts:  No  Homicidal Thoughts:  No  Memory:  Immediate;   Good Recent;   Good Remote;   NA  Judgement:  Fair  Insight:  Shallow  Psychomotor Activity:  Normal  Concentration:  Concentration: Good and Attention Span: Good  Recall:  Fair  Fund of Knowledge: Fair  Language: Good  Akathisia:  No  Handed:  Right  AIMS (if indicated): not done  Assets:  Communication Skills Desire for Improvement Resilience Social Support  ADL's:  Intact  Cognition: WNL  Sleep:  Good   Screenings: GAD-7    Flowsheet Row Office Visit from 10/27/2022 in [facility] Office Visit from 10/23/2022 in [facility] [place] Office Visit from 06/23/2022 in [facility] Office Visit from 05/20/2022 in [facility] Office Visit from 03/05/2022 in [facility]  Total GAD-7 Score 18 20 3 19 21       PHQ2-9    Flowsheet Row Office Visit from 02/24/2023 in [facility] Office Visit from 01/27/2023 in [facility] Office Visit from 10/27/2022  in [facility] Office Visit from 10/23/2022 in [facility] [place] Office Visit from 06/23/2022 in [facility]  PHQ-2 Total Score 0 0 6 5 0  PHQ-9 Total Score 0 0 26 25 2       Flowsheet Row Office Visit from 10/23/2022 in [facility] [place] Video Visit from 08/16/2021 in [facility] [place] ED from 07/17/2021 in [facility] Emergency Department at [facility]  C-SSRS RISK CATEGORY No Risk No Risk No Risk        Assessment and Plan: This patient is a 52 year old female with a history of  schizoaffective disorder and PTSD.  She seems to be doing well on her current regimen.  She will continue Prozac 40 mg daily for depression, Risperdal 2 mg in the morning and 6 mg in the evening for auditory hallucinations and paranoia, trazodone 300 mg at bedtime for sleep and clonazepam 0.5 mg twice daily for anxiety.  She will return to see me in 3 months  Collaboration of Care: Collaboration of Care: Primary Care Provider AEB notes are shared with PCP on the epic system  Patient/Guardian was advised Release of Information must be obtained prior to any record release in order to collaborate their care with an outside provider. Patient/Guardian was advised if they have not already done so to contact the registration department to sign all necessary forms in order for [place] to release information regarding their care.   Consent: Patient/Guardian gives verbal consent for treatment and assignment of benefits for services provided during this visit. Patient/Guardian expressed understanding and agreed to proceed.    Diannia Ruder, MD 03/10/2023, 1:51 PM

## 2023-03-17 ENCOUNTER — Telehealth (HOSPITAL_COMMUNITY): Payer: Self-pay

## 2023-03-17 NOTE — Telephone Encounter (Signed)
Called to confirm 03/19/23 appt no answer left vm

## 2023-03-18 NOTE — Telephone Encounter (Signed)
Appt confirmed via automated system

## 2023-03-19 ENCOUNTER — Ambulatory Visit (HOSPITAL_COMMUNITY): Payer: BC Managed Care – PPO | Admitting: Psychiatry

## 2023-03-19 ENCOUNTER — Encounter (HOSPITAL_COMMUNITY): Payer: Self-pay

## 2023-03-25 ENCOUNTER — Ambulatory Visit: Payer: BC Managed Care – PPO | Admitting: Endocrinology

## 2023-03-26 ENCOUNTER — Ambulatory Visit (INDEPENDENT_AMBULATORY_CARE_PROVIDER_SITE_OTHER): Payer: BC Managed Care – PPO | Admitting: Gastroenterology

## 2023-04-14 NOTE — Telephone Encounter (Signed)
Pt is needing another referral to Vein specialist

## 2023-04-21 ENCOUNTER — Encounter (INDEPENDENT_AMBULATORY_CARE_PROVIDER_SITE_OTHER): Payer: Self-pay | Admitting: Gastroenterology

## 2023-04-24 ENCOUNTER — Other Ambulatory Visit: Payer: Self-pay | Admitting: Nurse Practitioner

## 2023-04-24 DIAGNOSIS — I10 Essential (primary) hypertension: Secondary | ICD-10-CM

## 2023-04-24 DIAGNOSIS — E039 Hypothyroidism, unspecified: Secondary | ICD-10-CM

## 2023-04-28 ENCOUNTER — Other Ambulatory Visit: Payer: Self-pay | Admitting: Nurse Practitioner

## 2023-04-28 ENCOUNTER — Telehealth: Payer: Self-pay | Admitting: Nurse Practitioner

## 2023-04-28 ENCOUNTER — Other Ambulatory Visit (HOSPITAL_COMMUNITY): Payer: Self-pay | Admitting: Nurse Practitioner

## 2023-04-28 DIAGNOSIS — E1165 Type 2 diabetes mellitus with hyperglycemia: Secondary | ICD-10-CM

## 2023-04-28 MED ORDER — GLIPIZIDE 5 MG PO TABS: 10.0000 mg | ORAL_TABLET | Freq: Two times a day (BID) | ORAL | 4 refills | Status: DC

## 2023-04-28 NOTE — Telephone Encounter (Signed)
Hey did you try to call this pt?

## 2023-04-28 NOTE — Telephone Encounter (Signed)
Copied from CRM [id number]. Topic: General - Call Back - No Documentation >> Apr 21, 2023  3:11 PM Alvino Blood C wrote: Reason for CRM: PT is returning a call she recv'd. >> Apr 27, 2023  9:02 AM CMA Crystal B wrote: NOT OUR PATIENT

## 2023-04-30 NOTE — Telephone Encounter (Signed)
Pt needs another referral vein vas. Her previous one is closed.

## 2023-05-01 ENCOUNTER — Other Ambulatory Visit (HOSPITAL_COMMUNITY): Payer: Self-pay | Admitting: Nurse Practitioner

## 2023-05-01 DIAGNOSIS — M79661 Pain in right lower leg: Secondary | ICD-10-CM

## 2023-05-05 ENCOUNTER — Other Ambulatory Visit: Payer: Self-pay | Admitting: Nurse Practitioner

## 2023-05-05 ENCOUNTER — Other Ambulatory Visit (HOSPITAL_COMMUNITY): Payer: Self-pay | Admitting: Nurse Practitioner

## 2023-05-05 ENCOUNTER — Other Ambulatory Visit: Payer: Self-pay | Admitting: Family Medicine

## 2023-05-05 DIAGNOSIS — G43009 Migraine without aura, not intractable, without status migrainosus: Secondary | ICD-10-CM

## 2023-05-05 DIAGNOSIS — G629 Polyneuropathy, unspecified: Secondary | ICD-10-CM

## 2023-05-05 MED ORDER — GABAPENTIN 300 MG PO CAPS: 300.0000 mg | ORAL_CAPSULE | Freq: Every day | ORAL | 1 refills | Status: DC

## 2023-05-11 ENCOUNTER — Telehealth: Payer: Self-pay

## 2023-05-11 NOTE — Telephone Encounter (Signed)
Copied from CRM [id number]. Topic: General - Other >> May 11, 2023 10:32 AM Sasha H wrote: Reason for CRM: Pt would like physical form emailed to her at [email or web address]  Done [facility]

## 2023-05-16 ENCOUNTER — Other Ambulatory Visit: Payer: Self-pay | Admitting: Nurse Practitioner

## 2023-05-16 ENCOUNTER — Other Ambulatory Visit: Payer: Self-pay | Admitting: Family Medicine

## 2023-05-16 ENCOUNTER — Other Ambulatory Visit (HOSPITAL_COMMUNITY): Payer: Self-pay | Admitting: Psychiatry

## 2023-05-16 DIAGNOSIS — G43109 Migraine with aura, not intractable, without status migrainosus: Secondary | ICD-10-CM

## 2023-05-16 DIAGNOSIS — I10 Essential (primary) hypertension: Secondary | ICD-10-CM

## 2023-05-25 ENCOUNTER — Ambulatory Visit (INDEPENDENT_AMBULATORY_CARE_PROVIDER_SITE_OTHER): Payer: BC Managed Care – PPO | Admitting: Gastroenterology

## 2023-05-29 ENCOUNTER — Encounter (INDEPENDENT_AMBULATORY_CARE_PROVIDER_SITE_OTHER): Payer: Self-pay | Admitting: *Deleted

## 2023-06-03 ENCOUNTER — Ambulatory Visit: Payer: BC Managed Care – PPO | Admitting: Endocrinology

## 2023-06-09 ENCOUNTER — Ambulatory Visit (INDEPENDENT_AMBULATORY_CARE_PROVIDER_SITE_OTHER): Payer: BC Managed Care – PPO | Admitting: Gastroenterology

## 2023-06-09 ENCOUNTER — Encounter (INDEPENDENT_AMBULATORY_CARE_PROVIDER_SITE_OTHER): Payer: Self-pay

## 2023-06-23 ENCOUNTER — Ambulatory Visit: Payer: Self-pay | Admitting: Nurse Practitioner

## 2023-07-24 ENCOUNTER — Encounter: Payer: Self-pay | Admitting: Nurse Practitioner

## 2023-07-24 ENCOUNTER — Ambulatory Visit (INDEPENDENT_AMBULATORY_CARE_PROVIDER_SITE_OTHER): Payer: Self-pay | Admitting: Nurse Practitioner

## 2023-07-24 VITALS — BP 142/74 | HR 95 | Temp 98.2°F | Wt 153.0 lb

## 2023-07-24 DIAGNOSIS — Z1211 Encounter for screening for malignant neoplasm of colon: Secondary | ICD-10-CM

## 2023-07-24 DIAGNOSIS — I10 Essential (primary) hypertension: Secondary | ICD-10-CM

## 2023-07-24 DIAGNOSIS — E559 Vitamin D deficiency, unspecified: Secondary | ICD-10-CM

## 2023-07-24 DIAGNOSIS — Z794 Long term (current) use of insulin: Secondary | ICD-10-CM

## 2023-07-24 DIAGNOSIS — L089 Local infection of the skin and subcutaneous tissue, unspecified: Secondary | ICD-10-CM

## 2023-07-24 DIAGNOSIS — E785 Hyperlipidemia, unspecified: Secondary | ICD-10-CM

## 2023-07-24 DIAGNOSIS — E039 Hypothyroidism, unspecified: Secondary | ICD-10-CM

## 2023-07-24 DIAGNOSIS — E1165 Type 2 diabetes mellitus with hyperglycemia: Secondary | ICD-10-CM

## 2023-07-24 LAB — POCT GLYCOSYLATED HEMOGLOBIN (HGB A1C): Hemoglobin A1C: 14.4 % — AB (ref 4.0–5.6)

## 2023-07-24 MED ORDER — CARVEDILOL 12.5 MG PO TABS: 12.5000 mg | ORAL_TABLET | Freq: Two times a day (BID) | ORAL | 1 refills | Status: DC

## 2023-07-24 MED ORDER — GLIPIZIDE 5 MG PO TABS: 10.0000 mg | ORAL_TABLET | Freq: Two times a day (BID) | ORAL | 4 refills | Status: DC

## 2023-07-24 MED ORDER — AMLODIPINE BESYLATE 5 MG PO TABS: 5.0000 mg | ORAL_TABLET | Freq: Every day | ORAL | 1 refills | Status: DC

## 2023-07-24 MED ORDER — LOSARTAN POTASSIUM 100 MG PO TABS: 100.0000 mg | ORAL_TABLET | Freq: Every day | ORAL | 1 refills | Status: DC

## 2023-07-24 MED ORDER — DOXYCYCLINE HYCLATE 100 MG PO TABS: 100.0000 mg | ORAL_TABLET | Freq: Two times a day (BID) | ORAL | 0 refills | Status: DC

## 2023-07-24 MED ORDER — ATORVASTATIN CALCIUM 10 MG PO TABS: 10.0000 mg | ORAL_TABLET | Freq: Every day | ORAL | 2 refills | Status: DC

## 2023-07-24 MED ORDER — GLIPIZIDE 10 MG PO TABS
10.0000 mg | ORAL_TABLET | Freq: Two times a day (BID) | ORAL | 1 refills | Status: DC
Start: 2023-07-24 — End: 2023-09-14

## 2023-07-24 NOTE — Assessment & Plan Note (Signed)
 Lab Results  Component Value Date   CHOL 98 (L) 02/25/2023   HDL 48 02/25/2023   LDLCALC 35 02/25/2023   TRIG 71 02/25/2023   CHOLHDL 2.0 02/25/2023   Controlled, continue atorvastatin 10mg  daily

## 2023-07-24 NOTE — Progress Notes (Signed)
 Established Patient Office Visit  Subjective:  Patient ID: Kathryn [name], female    DOB: 12-08-1970  Age: 53 y.o. MRN: [id number]  CC: No chief complaint on file.   HPI Kathryn [name] is a 53 y.o. female  has a past medical history of Anemia, Anxiety, Asthma, Depression, GERD (gastroesophageal reflux disease), HLD (hyperlipidemia) (04/07/2019), Hypertension, Hypothyroidism, adult (04/07/2019), Neuropathy, Obesity (BMI 30.0-34.9) (04/07/2019), Schizoaffective disorder (HCC), and Type II diabetes mellitus, uncontrolled (04/27/2019).  Who  presents for f/u of chronic medical conditions.  Hypertension .  Currently on amlodipine 5mg  daily , carvedilol 12.5mg  BID, losartan 100mg  daily. Patient denies SOB, CP, dizziness . She has not taken her meds today.   Uncontrolled type 2 diabetes . Currently on trulicity 4.5mg  once weekly, glipizide 5mg  BID, tresiba 80 units at bedtime. Not taking jardiance due to cost of med. She now works day job and has been drinking juices  at work. States that her CBG has been in the 200's  has been using a glucometer but recently refilled her Dexcom. She denies hypoglycemia. Takes atorvastatin for hyperlipidemia. Has upcoming eye exam.   Soft tissue infection. Patient c/o a painful sore on her left posterior thigh that started a week ago, she has been applying neopsorin without improvement . She denies fever, drainage ,    Past Medical History:  Diagnosis Date   Anemia    Anxiety    Asthma    Depression    GERD (gastroesophageal reflux disease)    HLD (hyperlipidemia) 04/07/2019   Hypertension    Hypothyroidism, adult 04/07/2019   Neuropathy    Obesity (BMI 30.0-34.9) 04/07/2019   Schizoaffective disorder (HCC)    Type II diabetes mellitus, uncontrolled 04/27/2019    Past Surgical History:  Procedure Laterality Date   BACK SURGERY  09/06/2020   BIOPSY  05/03/2021   Procedure: BIOPSY;  Surgeon: Dolores Frame, MD;  Location: [facility];   Service: Gastroenterology;;   BREAST SURGERY Right    biopsy   CESAREAN SECTION     CHOLECYSTECTOMY  06/02/2012   Procedure: LAPAROSCOPIC CHOLECYSTECTOMY;  Surgeon: Dalia Heading, MD;  Location: [facility];  Service: General;  Laterality: N/A;  Attempted laparoscopic cholecystectomy   CHOLECYSTECTOMY  06/02/2012   Procedure: CHOLECYSTECTOMY;  Surgeon: Dalia Heading, MD;  Location: [facility];  Service: General;  Laterality: N/A;  converted to open at  4696   COLONOSCOPY WITH PROPOFOL N/A 07/18/2020   prep was fair, one 4mm polyp (inflammatory) stool in descending colon, transverse and ascending, distal rectum and anal verge normal   ESOPHAGEAL DILATION  12/31/2018   Procedure: ESOPHAGEAL DILATION;  Surgeon: Malissa Hippo, MD;  Location: [facility];  Service: Endoscopy;;   ESOPHAGOGASTRODUODENOSCOPY (EGD) WITH PROPOFOL N/A 08/24/2015   Procedure: ESOPHAGOGASTRODUODENOSCOPY (EGD) WITH PROPOFOL;  Surgeon: Malissa Hippo, MD;  Location: [facility];  Service: Endoscopy;  Laterality: N/A;  1:10 - Ann to notify pt to arrive at 11:30   ESOPHAGOGASTRODUODENOSCOPY (EGD) WITH PROPOFOL N/A 12/31/2018   normal, no abnormality to explain dysphagia, esophagus dilated.   ESOPHAGOGASTRODUODENOSCOPY (EGD) WITH PROPOFOL N/A 05/03/2021   Procedure: ESOPHAGOGASTRODUODENOSCOPY (EGD) WITH PROPOFOL;  Surgeon: Dolores Frame, MD;  Location: [facility];  Service: Gastroenterology;  Laterality: N/A;  1:20   FLEXIBLE SIGMOIDOSCOPY  07/17/2020   Procedure: FLEXIBLE SIGMOIDOSCOPY;  Surgeon: Dolores Frame, MD;  Location: [facility];  Service: Gastroenterology;;   POLYPECTOMY  07/18/2020   Procedure: POLYPECTOMY INTESTINAL;  Surgeon: Marguerita Merles,  Reuel Boom, MD;  Location: [facility];  Service: Gastroenterology;;  ascending colon polyp;    SAVORY DILATION  05/03/2021   Procedure: SAVORY DILATION;  Surgeon: Marguerita Merles, Reuel Boom, MD;  Location: [facility];  Service:  Gastroenterology;;   tooth removal  2019   all teeth removed   TUBAL LIGATION     X3    Family History  Problem Relation Age of Onset   Hypertension Mother    Alcohol abuse Mother    Heart disease Mother    Kidney disease Mother    Aneurysm Mother        brain   Hypertension Father    Alcohol abuse Sister    Alcohol abuse Maternal Aunt    Cancer Maternal Aunt    Alcohol abuse Paternal Aunt    Alcohol abuse Maternal Grandmother    Alcohol abuse Maternal Grandfather    Hearing loss Daughter    Alcohol abuse Cousin    Stroke Other    Diabetes Other    Cancer Other    Seizures Other     Social History   Socioeconomic History   Marital status: Married    Spouse name: Kendell Bane   Number of children: 3   Years of education: 12   Highest education level: Not on file  Occupational History    Comment: [employer]    Comment: 3rd shift  Tobacco Use   Smoking status: Never    Passive exposure: Never   Smokeless tobacco: Never  Vaping Use   Vaping status: Never Used  Substance and Sexual Activity   Alcohol use: No    Alcohol/week: 0.0 standard drinks of alcohol   Drug use: No   Sexual activity: Not Currently    Birth control/protection: Surgical  Other Topics Concern   Not on file  Social History Narrative   Married for 22 years.On disability secondary to schizophrenia.   Lives with husband.   Caffeine- decaf coffee, tea   Social Drivers of Corporate investment banker Strain: Not on file  Food Insecurity: Not on file  Transportation Needs: Not on file  Physical Activity: Not on file  Stress: Not on file  Social Connections: Not on file  Intimate Partner Violence: Not on file    Outpatient Medications Prior to Visit  Medication Sig Dispense Refill   albuterol (VENTOLIN HFA) 108 (90 Base) MCG/ACT inhaler Inhale 2 puffs into the lungs every 6 (six) hours as needed for wheezing or shortness of breath. 8 g 2   aspirin EC 81 MG tablet Take 81 mg by mouth daily.      benztropine (COGENTIN) 1 MG tablet TAKE 1 TABLET(1 MG) BY MOUTH AT BEDTIME 90 tablet 2   budesonide-formoterol (SYMBICORT) 80-4.5 MCG/ACT inhaler INHALE 2 PUFFS INTO THE LUNGS TWICE A DAY 10.2 each 3   Cholecalciferol (VITAMIN D-3) 125 MCG (5000 UT) TABS Take 2 tablets by mouth daily. 30 tablet 1   clonazePAM (KLONOPIN) 0.5 MG tablet Take 1 tablet (0.5 mg total) by mouth 2 (two) times daily as needed for anxiety. 180 tablet 0   Continuous Blood Gluc Receiver (DEXCOM G7 RECEIVER) DEVI USED TO MONITOR BLOOD GLUCOSE DX: E11.65 1 each 0   Continuous Blood Gluc Sensor (DEXCOM G7 SENSOR) MISC Used to monitor blood glucose DX E11.65 1 each 3   Continuous Glucose Sensor (DEXCOM G7 SENSOR) MISC Please use as directed. 3 each 3   Dulaglutide (TRULICITY) 4.5 MG/0.5ML SOPN Inject 4.5 mg as directed once a week. 2  mL 3   FLUoxetine (PROZAC) 40 MG capsule Take 1 capsule (40 mg total) by mouth daily. 30 capsule 2   gabapentin (NEURONTIN) 300 MG capsule Take 1 capsule (300 mg total) by mouth daily. 90 capsule 1   insulin degludec (TRESIBA FLEXTOUCH) 100 UNIT/ML FlexTouch Pen Inject 80 Units into the skin at bedtime. 15 mL 3   levothyroxine (SYNTHROID) 75 MCG tablet TAKE 1 TABLET BY MOUTH DAILY BEFORE BREAKFAST. 90 tablet 1   LINZESS 290 MCG CAPS capsule Take 1 capsule (290 mcg total) by mouth daily. 90 capsule 1   metoCLOPramide (REGLAN) 5 MG tablet Take 1 tablet (5 mg total) by mouth every 6 (six) hours as needed for nausea. 40 tablet 0   Multiple Vitamin (MULTIVITAMIN) tablet Take 1 tablet by mouth daily.     omeprazole (PRILOSEC) 40 MG capsule Take 1 capsule (40 mg total) by mouth 2 (two) times daily. 180 capsule 3   Polyvinyl Alcohol-Povidone PF (REFRESH) 1.4-0.6 % SOLN Place 1 drop into both eyes daily as needed (dry eyes).     risperiDONE (RISPERDAL) 2 MG tablet TAKE 1 TABLET EVERY MORNING AND 3 TABLETS AT BEDTIME 360 tablet 3   rizatriptan (MAXALT) 10 MG tablet Take 1 tablet (10 mg total) by mouth as needed  for migraine. May repeat in 2 hours if needed 10 tablet 0   topiramate (TOPAMAX) 100 MG tablet TAKE 1 TABLET BY MOUTH EVERYDAY AT BEDTIME 90 tablet 1   traMADol (ULTRAM) 50 MG tablet Take 50 mg by mouth every 6 (six) hours as needed.     traZODone (DESYREL) 100 MG tablet Take 3 tablets (300 mg total) by mouth at bedtime. 270 tablet 2   amLODipine (NORVASC) 5 MG tablet Take 1 tablet (5 mg total) by mouth daily. 90 tablet 1   atorvastatin (LIPITOR) 10 MG tablet Take 1 tablet (10 mg total) by mouth daily. 90 tablet 2   carvedilol (COREG) 12.5 MG tablet Take 1 tablet (12.5 mg total) by mouth 2 (two) times daily. 180 tablet 1   glipiZIDE (GLUCOTROL) 5 MG tablet Take 2 tablets (10 mg total) by mouth 2 (two) times daily before a meal. 120 tablet 4   losartan (COZAAR) 100 MG tablet TAKE 1 TABLET BY MOUTH EVERY DAY 90 tablet 1   blood glucose meter kit and supplies Dispense based on patient and insurance preference. Once daily testing DX E11.9 (Patient not taking: Reported on 07/24/2023) 1 each 0   Blood Glucose Monitoring Suppl (ACCU-CHEK GUIDE ME) w/Device KIT 1 Piece by Does not apply route as directed. (Patient not taking: Reported on 07/24/2023) 1 kit 0   empagliflozin (JARDIANCE) 10 MG TABS tablet Take 1 tablet (10 mg total) by mouth daily before breakfast. (Patient not taking: Reported on 07/24/2023) 30 tablet 3   glucose blood (ACCU-CHEK GUIDE) test strip Use as instructed (Patient not taking: Reported on 10/27/2022) 150 each 2   sucralfate (CARAFATE) 1 GM/10ML suspension Take 10 mLs (1 g total) by mouth 4 (four) times daily -  with meals and at bedtime. (Patient not taking: Reported on 07/24/2023) 420 mL 1   No facility-administered medications prior to visit.    Allergies  Allergen Reactions   [place] Bread [[place] Root (Wolfiporia Coc[place])]     PATIENT DENIES    ROS Review of Systems  Constitutional:  Negative for appetite change, chills, fatigue and fever.  HENT:  Negative for congestion,  postnasal drip, rhinorrhea and sneezing.   Respiratory:  Negative for cough, shortness  of breath and wheezing.   Cardiovascular:  Negative for chest pain, palpitations and leg swelling.  Gastrointestinal:  Negative for abdominal pain, constipation, nausea and vomiting.  Genitourinary:  Negative for difficulty urinating, dysuria, flank pain and frequency.  Musculoskeletal:  Negative for arthralgias, back pain, joint swelling and myalgias.  Skin:  Positive for rash. Negative for color change and pallor.  Neurological:  Negative for dizziness, facial asymmetry, weakness, numbness and headaches.  Psychiatric/Behavioral:  Negative for behavioral problems, confusion, self-injury and suicidal ideas.       Objective:    Physical Exam Vitals and nursing note reviewed.  Constitutional:      General: She is not in acute distress.    Appearance: Normal appearance. She is obese. She is not ill-appearing, toxic-appearing or diaphoretic.  HENT:     Mouth/Throat:     Mouth: Mucous membranes are moist.     Pharynx: Oropharynx is clear. No oropharyngeal exudate or posterior oropharyngeal erythema.  Eyes:     General: No scleral icterus.       Right eye: No discharge.        Left eye: No discharge.     Extraocular Movements: Extraocular movements intact.     Conjunctiva/sclera: Conjunctivae normal.  Cardiovascular:     Rate and Rhythm: Normal rate and regular rhythm.     Pulses: Normal pulses.     Heart sounds: Normal heart sounds. No murmur heard.    No friction rub. No gallop.  Pulmonary:     Effort: Pulmonary effort is normal. No respiratory distress.     Breath sounds: Normal breath sounds. No stridor. No wheezing, rhonchi or rales.  Chest:     Chest wall: No tenderness.  Abdominal:     General: There is no distension.     Palpations: Abdomen is soft.     Tenderness: There is no abdominal tenderness. There is no right CVA tenderness, left CVA tenderness or guarding.  Musculoskeletal:         General: No swelling, tenderness, deformity or signs of injury.     Right lower leg: No edema.     Left lower leg: No edema.  Skin:    General: Skin is warm and dry.     Capillary Refill: Capillary refill takes less than 2 seconds.     Coloration: Skin is not jaundiced or pale.     Findings: Lesion present. No bruising or erythema.     Comments: A tender lesion noted on posterior left thigh , no redness or drainage noted   Neurological:     Mental Status: She is alert and oriented to person, place, and time.     Motor: No weakness.     Coordination: Coordination normal.     Gait: Gait normal.  Psychiatric:        Mood and Affect: Mood normal.        Behavior: Behavior normal.        Thought Content: Thought content normal.        Judgment: Judgment normal.     BP (!) 142/74   Pulse 95   Temp 98.2 F (36.8 C)   Wt 153 lb (69.4 kg)   LMP 01/23/2013   SpO2 100%   BMI 30.64 kg/m  Wt Readings from Last 3 Encounters:  07/24/23 153 lb (69.4 kg)  02/24/23 154 lb 3.2 oz (69.9 kg)  01/27/23 158 lb (71.7 kg)    Lab Results  Component Value Date   TSH 1.230 10/27/2022  Lab Results  Component Value Date   WBC 6.1 10/27/2022   HGB 11.1 10/27/2022   HCT 33.3 (L) 10/27/2022   MCV 85 10/27/2022   PLT 207 10/27/2022   Lab Results  Component Value Date   NA 138 02/25/2023   K 3.7 02/25/2023   CO2 21 02/25/2023   GLUCOSE 116 (H) 02/25/2023   BUN 14 02/25/2023   CREATININE 1.02 (H) 02/25/2023   BILITOT 0.4 02/25/2023   ALKPHOS 94 02/25/2023   AST 10 02/25/2023   ALT 13 02/25/2023   PROT 7.6 02/25/2023   ALBUMIN 4.6 02/25/2023   CALCIUM 9.6 02/25/2023   ANIONGAP 7 07/17/2021   EGFR 66 02/25/2023   Lab Results  Component Value Date   CHOL 98 (L) 02/25/2023   Lab Results  Component Value Date   HDL 48 02/25/2023   Lab Results  Component Value Date   LDLCALC 35 02/25/2023   Lab Results  Component Value Date   TRIG 71 02/25/2023   Lab Results  Component  Value Date   CHOLHDL 2.0 02/25/2023   Lab Results  Component Value Date   HGBA1C 14.4 (A) 07/24/2023      Assessment & Plan:   Problem List Items Addressed This Visit       Cardiovascular and Mediastinum   Essential hypertension, benign   She has not taken meds today  Continue current meds, encouraged to monitor BP at home , BP goal is less than 130/80  amLODipine (NORVASC) 5 MG tablet; Take 1 tablet (5 mg total) by mouth daily.  Dispense: 90 tablet; Refill: 1 - atorvastatin (LIPITOR) 10 MG tablet; Take 1 tablet (10 mg total) by mouth daily.  Dispense: 90 tablet; Refill: 2 - carvedilol (COREG) 12.5 MG tablet; Take 1 tablet (12.5 mg total) by mouth 2 (two) times daily.  Dispense: 180 tablet; Refill: 1 - losartan (COZAAR) 100 MG tablet; Take 1 tablet (100 mg total) by mouth daily.  Dispense: 90 tablet; Refill: 1 DASH diet and commitment to daily physical activity for a minimum of 30 minutes discussed and encouraged, as a part of hypertension management. The importance of attaining a healthy weight is also discussed.     07/24/2023    9:19 AM 07/24/2023    9:03 AM 02/24/2023   11:14 AM 02/24/2023   11:02 AM 01/27/2023   10:20 AM 01/27/2023   10:14 AM 10/27/2022   10:48 AM  BP/Weight  Systolic BP 142 151 108 107 134 137 131  Diastolic BP 74 82 80 84 85 91 79  Wt. (Lbs)  153  154.2  158 157  BMI  30.64 kg/m2  30.88 kg/m2  30.86 kg/m2 30.66 kg/m2           Relevant Medications   amLODipine (NORVASC) 5 MG tablet   atorvastatin (LIPITOR) 10 MG tablet   carvedilol (COREG) 12.5 MG tablet   losartan (COZAAR) 100 MG tablet   Other Relevant Orders   CMP14+EGFR     Endocrine   Hypothyroidism   Lab Results  Component Value Date   TSH 1.230 10/27/2022   Currently on levothyroxine 75 mcg daily Check TSH T4 levels      Relevant Medications   carvedilol (COREG) 12.5 MG tablet   Other Relevant Orders   TSH + free T4   Uncontrolled type 2 diabetes mellitus with hyperglycemia, with  long-term current use of insulin (HCC) - Primary   Lab Results  Component Value Date   HGBA1C 14.4 (A) 07/24/2023  Currently  on trulicity 4.5mg  once weekly, glipizide 5mg  BID, tresiba 80 units at bedtime. Increase glipizide to 10mg  BID  Patient counseled on low-carb modified diet, engage in regular moderate exercises at least 150 minutes weekly as tolerated  - POCT glycosylated hemoglobin (Hb A1C) - AMB Referral [facility] Care Management - Amb Referral to Nutrition and Diabetic Education - glipiZIDE (GLUCOTROL) 10 MG tablet; Take 1 tablet (10 mg total) by mouth 2 (two) times daily before a meal.  Dispense: 180 tablet; Refill: 1          Relevant Medications   atorvastatin (LIPITOR) 10 MG tablet   losartan (COZAAR) 100 MG tablet   glipiZIDE (GLUCOTROL) 10 MG tablet   Other Relevant Orders   POCT glycosylated hemoglobin (Hb A1C) (Completed)   AMB Referral [facility] Care Management   Amb Referral to Nutrition and Diabetic Education     Other   Dyslipidemia, goal LDL below 70   Lab Results  Component Value Date   CHOL 98 (L) 02/25/2023   HDL 48 02/25/2023   LDLCALC 35 02/25/2023   TRIG 71 02/25/2023   CHOLHDL 2.0 02/25/2023   Controlled, continue atorvastatin 10mg  daily       Relevant Medications   amLODipine (NORVASC) 5 MG tablet   atorvastatin (LIPITOR) 10 MG tablet   carvedilol (COREG) 12.5 MG tablet   losartan (COZAAR) 100 MG tablet   Vitamin D deficiency   Last vitamin D Lab Results  Component Value Date   VD25OH 41.8 05/20/2022   On vitamin D 5,000 daily ,  Checking vitamin D levels.       Relevant Orders   VITAMIN D 25 Hydroxy (Vit-D Deficiency, Fractures)   Soft tissue infection    - doxycycline (VIBRA-TABS) 100 MG tablet; Take 1 tablet (100 mg total) by mouth 2 (two) times daily.  Dispense: 14 tablet; Refill: 0       Relevant Medications   doxycycline (VIBRA-TABS) 100 MG tablet   Other Visit Diagnoses       Screening for colon cancer       Relevant Orders    Ambulatory referral to Gastroenterology       Meds ordered this encounter  Medications   amLODipine (NORVASC) 5 MG tablet    Sig: Take 1 tablet (5 mg total) by mouth daily.    Dispense:  90 tablet    Refill:  1   atorvastatin (LIPITOR) 10 MG tablet    Sig: Take 1 tablet (10 mg total) by mouth daily.    Dispense:  90 tablet    Refill:  2   carvedilol (COREG) 12.5 MG tablet    Sig: Take 1 tablet (12.5 mg total) by mouth 2 (two) times daily.    Dispense:  180 tablet    Refill:  1   DISCONTD: glipiZIDE (GLUCOTROL) 5 MG tablet    Sig: Take 2 tablets (10 mg total) by mouth 2 (two) times daily before a meal.    Dispense:  120 tablet    Refill:  4   losartan (COZAAR) 100 MG tablet    Sig: Take 1 tablet (100 mg total) by mouth daily.    Dispense:  90 tablet    Refill:  1   doxycycline (VIBRA-TABS) 100 MG tablet    Sig: Take 1 tablet (100 mg total) by mouth 2 (two) times daily.    Dispense:  14 tablet    Refill:  0   glipiZIDE (GLUCOTROL) 10 MG tablet    Sig: Take 1 tablet (  10 mg total) by mouth 2 (two) times daily before a meal.    Dispense:  180 tablet    Refill:  1    Follow-up: Return in about 3 months (around 10/21/2023) for HTN, DM.    Donell Beers, FNP

## 2023-07-24 NOTE — Assessment & Plan Note (Signed)
 Lab Results  Component Value Date   HGBA1C 14.4 (A) 07/24/2023  Currently on trulicity 4.5mg  once weekly, glipizide 5mg  BID, tresiba 80 units at bedtime. Increase glipizide to 10mg  BID  Patient counseled on low-carb modified diet, engage in regular moderate exercises at least 150 minutes weekly as tolerated  - POCT glycosylated hemoglobin (Hb A1C) - AMB Referral [facility]I Care Management - Amb Referral to Nutrition and Diabetic Education - glipiZIDE (GLUCOTROL) 10 MG tablet; Take 1 tablet (10 mg total) by mouth 2 (two) times daily before a meal.  Dispense: 180 tablet; Refill: 1

## 2023-07-24 NOTE — Patient Instructions (Addendum)
 Please get your pneumococcal  vaccine at local pharmacy.   Goal for fasting blood sugar ranges from 80 to 120 and 2 hours after any meal or at bedtime should be between 130 to 170.    Around 3 times per week, check your blood pressure 2 times per day. once in the morning and once in the evening. The readings should be at least one minute apart. Write down these values and bring them to your next nurse visit/appointment.  When you check your BP, make sure you have been doing something calm/relaxing 5 minutes prior to checking. Both feet should be flat on the floor and you should be sitting. Use your left arm and make sure it is in a relaxed position (on a table), and that the cuff is at the approximate level/height of your heart.  Blood pressure goal is less than 130/80  1. Uncontrolled type 2 diabetes mellitus with hyperglycemia, with long-term current use of insulin (HCC) (Primary)  - POCT glycosylated hemoglobin (Hb A1C) - AMB Referral [facility]I Care Management - Amb Referral to Nutrition and Diabetic Education - glipiZIDE (GLUCOTROL) 10 MG tablet; Take 1 tablet (10 mg total) by mouth 2 (two) times daily before a meal.  Dispense: 180 tablet; Refill: 1  2. Dyslipidemia, goal LDL below 70   3. Hypothyroidism, unspecified type  - TSH + free T4  4. Essential (primary) hypertension  - CMP14+EGFR  5. Screening for colon cancer  - Ambulatory referral to Gastroenterology  6. Essential hypertension, benign  - amLODipine (NORVASC) 5 MG tablet; Take 1 tablet (5 mg total) by mouth daily.  Dispense: 90 tablet; Refill: 1 - atorvastatin (LIPITOR) 10 MG tablet; Take 1 tablet (10 mg total) by mouth daily.  Dispense: 90 tablet; Refill: 2 - carvedilol (COREG) 12.5 MG tablet; Take 1 tablet (12.5 mg total) by mouth 2 (two) times daily.  Dispense: 180 tablet; Refill: 1 - losartan (COZAAR) 100 MG tablet; Take 1 tablet (100 mg total) by mouth daily.  Dispense: 90 tablet; Refill: 1  7. Vitamin D  deficiency  - VITAMIN D 25 Hydroxy (Vit-D Deficiency, Fractures)  8. Soft tissue infection  - doxycycline (VIBRA-TABS) 100 MG tablet; Take 1 tablet (100 mg total) by mouth 2 (two) times daily.  Dispense: 14 tablet; Refill: 0     It is important that you exercise regularly at least 30 minutes 5 times a week as tolerated  Think about what you will eat, plan ahead. Choose " clean, green, fresh or frozen" over canned, processed or packaged foods which are more sugary, salty and fatty. 70 to 75% of food eaten should be vegetables and fruit. Three meals at set times with snacks allowed between meals, but they must be fruit or vegetables. Aim to eat over a 12 hour period , example 7 am to 7 pm, and STOP after  your last meal of the day. Drink water,generally about 64 ounces per day, no other drink is as healthy. Fruit juice is best enjoyed in a healthy way, by EATING the fruit.  Thanks for choosing [facility] we consider it a privelige to serve you.

## 2023-07-24 NOTE — Assessment & Plan Note (Addendum)
 She has not taken meds today  Continue current meds, encouraged to monitor BP at home , BP goal is less than 130/80  amLODipine (NORVASC) 5 MG tablet; Take 1 tablet (5 mg total) by mouth daily.  Dispense: 90 tablet; Refill: 1 - atorvastatin (LIPITOR) 10 MG tablet; Take 1 tablet (10 mg total) by mouth daily.  Dispense: 90 tablet; Refill: 2 - carvedilol (COREG) 12.5 MG tablet; Take 1 tablet (12.5 mg total) by mouth 2 (two) times daily.  Dispense: 180 tablet; Refill: 1 - losartan (COZAAR) 100 MG tablet; Take 1 tablet (100 mg total) by mouth daily.  Dispense: 90 tablet; Refill: 1 DASH diet and commitment to daily physical activity for a minimum of 30 minutes discussed and encouraged, as a part of hypertension management. The importance of attaining a healthy weight is also discussed.     07/24/2023    9:19 AM 07/24/2023    9:03 AM 02/24/2023   11:14 AM 02/24/2023   11:02 AM 01/27/2023   10:20 AM 01/27/2023   10:14 AM 10/27/2022   10:48 AM  BP/Weight  Systolic BP 142 151 108 107 134 137 131  Diastolic BP 74 82 80 84 85 91 79  Wt. (Lbs)  153  154.2  158 157  BMI  30.64 kg/m2  30.88 kg/m2  30.86 kg/m2 30.66 kg/m2

## 2023-07-24 NOTE — Assessment & Plan Note (Signed)
-   doxycycline (VIBRA-TABS) 100 MG tablet; Take 1 tablet (100 mg total) by mouth 2 (two) times daily.  Dispense: 14 tablet; Refill: 0

## 2023-07-24 NOTE — Assessment & Plan Note (Signed)
 Last vitamin D Lab Results  Component Value Date   VD25OH 41.8 05/20/2022   On vitamin D 5,000 daily ,  Checking vitamin D levels.

## 2023-07-24 NOTE — Assessment & Plan Note (Signed)
 Lab Results  Component Value Date   TSH 1.230 10/27/2022   Currently on levothyroxine 75 mcg daily Check TSH T4 levels

## 2023-07-25 LAB — CMP14+EGFR
ALT: 25 IU/L (ref 0–32)
AST: 16 IU/L (ref 0–40)
Albumin: 4.6 g/dL (ref 3.8–4.9)
Alkaline Phosphatase: 118 IU/L (ref 44–121)
BUN/Creatinine Ratio: 16 (ref 9–23)
BUN: 15 mg/dL (ref 6–24)
Bilirubin Total: 0.9 mg/dL (ref 0.0–1.2)
CO2: 20 mmol/L (ref 20–29)
Calcium: 10.1 mg/dL (ref 8.7–10.2)
Chloride: 101 mmol/L (ref 96–106)
Creatinine, Ser: 0.93 mg/dL (ref 0.57–1.00)
Globulin, Total: 3 g/dL (ref 1.5–4.5)
Glucose: 470 mg/dL — ABNORMAL HIGH (ref 70–99)
Potassium: 4.2 mmol/L (ref 3.5–5.2)
Sodium: 137 mmol/L (ref 134–144)
Total Protein: 7.6 g/dL (ref 6.0–8.5)
eGFR: 74 mL/min/{1.73_m2} (ref >59)

## 2023-07-25 LAB — TSH+FREE T4
Free T4: 1.43 ng/dL (ref 0.82–1.77)
TSH: 0.445 u[IU]/mL — ABNORMAL LOW (ref 0.450–4.500)

## 2023-07-25 LAB — VITAMIN D 25 HYDROXY (VIT D DEFICIENCY, FRACTURES): Vit D, 25-Hydroxy: 25 ng/mL — ABNORMAL LOW (ref 30.0–100.0)

## 2023-07-27 ENCOUNTER — Ambulatory Visit: Payer: Self-pay | Attending: Cardiology | Admitting: Cardiology

## 2023-07-27 ENCOUNTER — Encounter: Payer: Self-pay | Admitting: Cardiology

## 2023-07-27 ENCOUNTER — Other Ambulatory Visit (HOSPITAL_COMMUNITY): Payer: Self-pay | Admitting: Psychiatry

## 2023-07-27 ENCOUNTER — Other Ambulatory Visit: Payer: Self-pay | Admitting: Family Medicine

## 2023-07-27 VITALS — BP 124/75 | HR 100 | Ht 60.0 in | Wt 165.4 lb

## 2023-07-27 DIAGNOSIS — I10 Essential (primary) hypertension: Secondary | ICD-10-CM

## 2023-07-27 DIAGNOSIS — J4540 Moderate persistent asthma, uncomplicated: Secondary | ICD-10-CM

## 2023-07-27 DIAGNOSIS — R0789 Other chest pain: Secondary | ICD-10-CM

## 2023-07-27 DIAGNOSIS — E782 Mixed hyperlipidemia: Secondary | ICD-10-CM

## 2023-07-27 DIAGNOSIS — G43009 Migraine without aura, not intractable, without status migrainosus: Secondary | ICD-10-CM

## 2023-07-27 NOTE — Progress Notes (Signed)
 Clinical Summary Kathryn [name] is a 53 y.o.female seen for follow up of the following medical problems.    1. Chest pain 01/2017 Echo LVEF 60-65%, no WMAs, small effusion. Diastolic function not described. By reported parameters abnormal indeterminate grade.  03/2017 nuclear stress no ischemia.       07/2020 nuclear stress: no ischemia.  - chronic chest pains. Usually comes on with stress. No exertional symptoms   2. HTN - she is compliant with meds - home bp's 120s-130/80s       3. Hyperlipidemia - 07/2020 TC 107 HDL 53 TG 89 LDL 37 - 07/2021 TC 141 TG 76 HDL 71 LDL 55 - 02/2023 TC 98 TG 71 HDL 48 LDL 35 - she is on atorvatstatin 10mg    4. DM2 - poorly controlled, last A1c 14 - followed by pcp and endo   5. Dysphagia/epigastric pain - followed by GI   SH: works as Location manager Past Medical History:  Diagnosis Date   Anemia    Anxiety    Asthma    Depression    GERD (gastroesophageal reflux disease)    HLD (hyperlipidemia) 04/07/2019   Hypertension    Hypothyroidism, adult 04/07/2019   Neuropathy    Obesity (BMI 30.0-34.9) 04/07/2019   Schizoaffective disorder (HCC)    Type II diabetes mellitus, uncontrolled 04/27/2019     Allergies  Allergen Reactions   [place] Bread [China Root (Wolfiporia Cocos)]     PATIENT DENIES     Current Outpatient Medications  Medication Sig Dispense Refill   albuterol (VENTOLIN HFA) 108 (90 Base) MCG/ACT inhaler Inhale 2 puffs into the lungs every 6 (six) hours as needed for wheezing or shortness of breath. 8 g 2   amLODipine (NORVASC) 5 MG tablet Take 1 tablet (5 mg total) by mouth daily. 90 tablet 1   atorvastatin (LIPITOR) 10 MG tablet Take 1 tablet (10 mg total) by mouth daily. 90 tablet 2   benztropine (COGENTIN) 1 MG tablet TAKE 1 TABLET(1 MG) BY MOUTH AT BEDTIME 90 tablet 2   budesonide-formoterol (SYMBICORT) 80-4.5 MCG/ACT inhaler INHALE 2 PUFFS INTO THE LUNGS TWICE A DAY 10.2 each 3   carvedilol (COREG) 12.5 MG  tablet Take 1 tablet (12.5 mg total) by mouth 2 (two) times daily. 180 tablet 1   Cholecalciferol (VITAMIN D-3) 125 MCG (5000 UT) TABS Take 2 tablets by mouth daily. 30 tablet 1   clonazePAM (KLONOPIN) 0.5 MG tablet Take 1 tablet (0.5 mg total) by mouth 2 (two) times daily as needed for anxiety. 180 tablet 0   doxycycline (VIBRA-TABS) 100 MG tablet Take 1 tablet (100 mg total) by mouth 2 (two) times daily. 14 tablet 0   Dulaglutide (TRULICITY) 4.5 MG/0.5ML SOPN Inject 4.5 mg as directed once a week. 2 mL 3   DULoxetine (CYMBALTA) 60 MG capsule Take 60 mg by mouth 2 (two) times daily.     gabapentin (NEURONTIN) 300 MG capsule Take 1 capsule (300 mg total) by mouth daily. 90 capsule 1   glipiZIDE (GLUCOTROL) 10 MG tablet Take 1 tablet (10 mg total) by mouth 2 (two) times daily before a meal. 180 tablet 1   insulin degludec (TRESIBA FLEXTOUCH) 100 UNIT/ML FlexTouch Pen Inject 80 Units into the skin at bedtime. 15 mL 3   levothyroxine (SYNTHROID) 75 MCG tablet TAKE 1 TABLET BY MOUTH DAILY BEFORE BREAKFAST. 90 tablet 1   LINZESS 290 MCG CAPS capsule Take 1 capsule (290 mcg total) by mouth daily. (Patient taking differently:  Take 290 mcg by mouth daily. Holding until after procedure) 90 capsule 1   losartan (COZAAR) 100 MG tablet Take 1 tablet (100 mg total) by mouth daily. 90 tablet 1   metoCLOPramide (REGLAN) 5 MG tablet Take 1 tablet (5 mg total) by mouth every 6 (six) hours as needed for nausea. 40 tablet 0   Multiple Vitamin (MULTIVITAMIN) tablet Take 1 tablet by mouth daily.     omeprazole (PRILOSEC) 40 MG capsule Take 1 capsule (40 mg total) by mouth 2 (two) times daily. 180 capsule 3   penicillin v potassium (VEETID) 500 MG tablet Take 500 mg by mouth 4 (four) times daily.     Polyvinyl Alcohol-Povidone PF (REFRESH) 1.4-0.6 % SOLN Place 1 drop into both eyes daily as needed (dry eyes).     risperiDONE (RISPERDAL) 2 MG tablet TAKE 1 TABLET EVERY MORNING AND 3 TABLETS AT BEDTIME 360 tablet 3    rizatriptan (MAXALT) 10 MG tablet Take 1 tablet (10 mg total) by mouth as needed for migraine. May repeat in 2 hours if needed 10 tablet 0   sucralfate (CARAFATE) 1 GM/10ML suspension Take 10 mLs (1 g total) by mouth 4 (four) times daily -  with meals and at bedtime. 420 mL 1   topiramate (TOPAMAX) 100 MG tablet TAKE 1 TABLET BY MOUTH EVERYDAY AT BEDTIME 90 tablet 1   traMADol (ULTRAM) 50 MG tablet Take 50 mg by mouth every 6 (six) hours as needed.     traZODone (DESYREL) 100 MG tablet Take 3 tablets (300 mg total) by mouth at bedtime. 270 tablet 2   aspirin EC 81 MG tablet Take 81 mg by mouth daily.     blood glucose meter kit and supplies Dispense based on patient and insurance preference. Once daily testing DX E11.9 (Patient not taking: Reported on 07/27/2023) 1 each 0   Blood Glucose Monitoring Suppl (ACCU-CHEK GUIDE ME) w/Device KIT 1 Piece by Does not apply route as directed. (Patient not taking: Reported on 07/24/2023) 1 kit 0   Continuous Blood Gluc Receiver (DEXCOM G7 RECEIVER) DEVI USED TO MONITOR BLOOD GLUCOSE DX: E11.65 1 each 0   Continuous Blood Gluc Sensor (DEXCOM G7 SENSOR) MISC Used to monitor blood glucose DX E11.65 1 each 3   Continuous Glucose Sensor (DEXCOM G7 SENSOR) MISC Please use as directed. 3 each 3   empagliflozin (JARDIANCE) 10 MG TABS tablet Take 1 tablet (10 mg total) by mouth daily before breakfast. (Patient not taking: Reported on 02/24/2023) 30 tablet 3   FLUoxetine (PROZAC) 40 MG capsule Take 1 capsule (40 mg total) by mouth daily. (Patient not taking: Reported on 07/27/2023) 30 capsule 2   glucose blood (ACCU-CHEK GUIDE) test strip Use as instructed (Patient not taking: Reported on 10/27/2022) 150 each 2   No current facility-administered medications for this visit.     Past Surgical History:  Procedure Laterality Date   BACK SURGERY  09/06/2020   BIOPSY  05/03/2021   Procedure: BIOPSY;  Surgeon: Dolores Frame, MD;  Location: [facility];  Service:  Gastroenterology;;   BREAST SURGERY Right    biopsy   CESAREAN SECTION     CHOLECYSTECTOMY  06/02/2012   Procedure: LAPAROSCOPIC CHOLECYSTECTOMY;  Surgeon: Dalia Heading, MD;  Location: [facility];  Service: General;  Laterality: N/A;  Attempted laparoscopic cholecystectomy   CHOLECYSTECTOMY  06/02/2012   Procedure: CHOLECYSTECTOMY;  Surgeon: Dalia Heading, MD;  Location: [facility];  Service: General;  Laterality: N/A;  converted to open at  1610   COLONOSCOPY WITH PROPOFOL N/A 07/18/2020   prep was fair, one 4mm polyp (inflammatory) stool in descending colon, transverse and ascending, distal rectum and anal verge normal   ESOPHAGEAL DILATION  12/31/2018   Procedure: ESOPHAGEAL DILATION;  Surgeon: Malissa Hippo, MD;  Location: [facility];  Service: Endoscopy;;   ESOPHAGOGASTRODUODENOSCOPY (EGD) WITH PROPOFOL N/A 08/24/2015   Procedure: ESOPHAGOGASTRODUODENOSCOPY (EGD) WITH PROPOFOL;  Surgeon: Malissa Hippo, MD;  Location: [facility];  Service: Endoscopy;  Laterality: N/A;  1:10 - Ann to notify pt to arrive at 11:30   ESOPHAGOGASTRODUODENOSCOPY (EGD) WITH PROPOFOL N/A 12/31/2018   normal, no abnormality to explain dysphagia, esophagus dilated.   ESOPHAGOGASTRODUODENOSCOPY (EGD) WITH PROPOFOL N/A 05/03/2021   Procedure: ESOPHAGOGASTRODUODENOSCOPY (EGD) WITH PROPOFOL;  Surgeon: Dolores Frame, MD;  Location: [facility];  Service: Gastroenterology;  Laterality: N/A;  1:20   FLEXIBLE SIGMOIDOSCOPY  07/17/2020   Procedure: FLEXIBLE SIGMOIDOSCOPY;  Surgeon: Marguerita Merles, Reuel Boom, MD;  Location: [facility];  Service: Gastroenterology;;   POLYPECTOMY  07/18/2020   Procedure: POLYPECTOMY INTESTINAL;  Surgeon: Marguerita Merles, Reuel Boom, MD;  Location: [facility];  Service: Gastroenterology;;  ascending colon polyp;    SAVORY DILATION  05/03/2021   Procedure: SAVORY DILATION;  Surgeon: Dolores Frame, MD;  Location: [facility];  Service: Gastroenterology;;    tooth removal  2019   all teeth removed   TUBAL LIGATION     X3     Allergies  Allergen Reactions   [place] Bread [China Root (Wolfiporia Cocos)]     PATIENT DENIES      Family History  Problem Relation Age of Onset   Hypertension Mother    Alcohol abuse Mother    Heart disease Mother    Kidney disease Mother    Aneurysm Mother        brain   Hypertension Father    Alcohol abuse Sister    Alcohol abuse Maternal Aunt    Cancer Maternal Aunt    Alcohol abuse Paternal Aunt    Alcohol abuse Maternal Grandmother    Alcohol abuse Maternal Grandfather    Hearing loss Daughter    Alcohol abuse Cousin    Stroke Other    Diabetes Other    Cancer Other    Seizures Other      Social History Kathryn [name] reports that she has never smoked. She has never been exposed to tobacco smoke. She has never used smokeless tobacco. Kathryn [name] reports no history of alcohol use.    Physical Examination Vitals:   07/27/23 1541 07/27/23 1601  BP: 136/88 124/75  Pulse: 100   SpO2: 99%    Filed Weights   07/27/23 1541  Weight: 165 lb 6.4 oz (75 kg)    Gen: resting comfortably, no acute distress HEENT: no scleral icterus, pupils equal round and reactive, no palptable cervical adenopathy,  CV: RRR, no mrg, no jvd Resp: Clear to auscultation bilaterally GI: abdomen is soft, non-tender, non-distended, normal bowel sounds, no hepatosplenomegaly MSK: extremities are warm, no edema.  Skin: warm, no rash Neuro:  no focal deficits Psych: appropriate affect   Diagnostic Studies  01/2017 echo Study Conclusions   - Left ventricle: The cavity size was normal. Wall thickness was   increased in a pattern of mild LVH. Systolic function was normal.   The estimated ejection fraction was in the range of 60% to 65%.   Wall motion was normal; there were no regional wall motion  abnormalities. - Right atrium: Central venous pressure (est): 3 mm Hg. - Atrial septum: No defect or patent  foramen ovale was identified. - Tricuspid valve: There was trivial regurgitation. - Pulmonary arteries: Systolic pressure could not be accurately   estimated. - Pericardium, extracardiac: A small pericardial effusion was   identified circumferential to the heart.   Impressions:   - Mild LVH with LVEF 60-65%. Indeterminate diastolic function.   Trivial tricuspid regurgitation. Small circumferential   pericardial effusion.       03/2017 Nuclear stress Sinus rhythm with nonspecific T wave abnormalities seen throughout study. The study is normal with normal myocardial perfusion. This is a low risk study. Nuclear stress EF: 62%.     07/2020 nuclear stress No diagnostic ST segment changes to indicate ischemia. Small, mild intensity, inferior apical defect that is fixed, more prominent on rest imaging and consistent with soft tissue attenuation. There is evidence of variable breast attenuation artifact. Summed stress score is low at 2. This is a low risk study. Nuclear stress EF: 66%.   Assessment and Plan  1. Chest pain - long history of chest pain, negative stress test in 2018 and 2022 - her chronic symptoms overall unchanged, nonexertional - continue to monitor at this time.  - EKG today SR, no ischemic changes   2. HTN - at goal, continue current meds   3. Hyperlipidemia -at goal, continue current meds    F/u 1 year   Antoine Poche, M.D.

## 2023-07-27 NOTE — Patient Instructions (Signed)

## 2023-07-31 ENCOUNTER — Encounter: Payer: Self-pay | Admitting: Cardiology

## 2023-08-05 ENCOUNTER — Telehealth: Payer: Self-pay

## 2023-08-05 NOTE — Progress Notes (Signed)
   08/05/2023  Patient ID: Kathryn [name], female   DOB: [birth year], 53 y.o.   MRN: [id number]  Contacted patient regarding referral for hypertension, diabetes, and medication management from Donell Beers, FNP .   Appointment scheduled for 08/07/23 at 11:00 am.   Harlon Flor, PharmD Clinical Pharmacist  [phone]

## 2023-08-05 NOTE — Progress Notes (Signed)
   08/05/2023  Patient ID: Kathryn [name], female   DOB: [birth year], 53 y.o.   MRN: [id number]  Contacted patient regarding referral for hypertension, diabetes, and medication management from Donell Beers, FNP .   Left patient a voicemail to return my call at their convenience  Harlon Flor, PharmD Clinical Pharmacist  [phone]

## 2023-08-06 ENCOUNTER — Telehealth: Payer: Self-pay

## 2023-08-06 NOTE — Telephone Encounter (Signed)
 Copied from CRM [id number]. Topic: Clinical - Lab/Test Results >> Aug 05, 2023  4:08 PM Elle L wrote: Reason for CRM: The patient was returning a call from the office regarding her lab results. I relayed them to her and she expressed understanding.

## 2023-08-07 NOTE — Progress Notes (Signed)
   08/07/2023  Patient ID: Kathryn [name], female   DOB: 1971/03/26, 53 y.o.   MRN: [id number]  Contacted patient regarding referral for hypertension, diabetes, and medication management from Donell Beers, FNP .   Appointment scheduled for 08/07/23 at 11:00 am. Left message for patient. Will make one final attempt next week.  Harlon Flor, PharmD Clinical Pharmacist  [phone]

## 2023-08-10 ENCOUNTER — Other Ambulatory Visit (HOSPITAL_COMMUNITY): Payer: Self-pay | Admitting: Psychiatry

## 2023-08-10 ENCOUNTER — Other Ambulatory Visit: Payer: Self-pay | Admitting: Nurse Practitioner

## 2023-08-10 DIAGNOSIS — G43109 Migraine with aura, not intractable, without status migrainosus: Secondary | ICD-10-CM

## 2023-08-10 DIAGNOSIS — E039 Hypothyroidism, unspecified: Secondary | ICD-10-CM

## 2023-08-10 NOTE — Telephone Encounter (Signed)
 Call for appt

## 2023-08-13 NOTE — Progress Notes (Signed)
   08/13/2023  Patient ID: Kathryn [name], female   DOB: 01/25/1971, 53 y.o.   MRN: [id number]  Contacted patient regarding referral for hypertension, diabetes, and medication management from Donell Beers, FNP .   Left HIPAA compliant voicemail. A third unsuccesful telephone outreach was attempted today to contact the patient who was referred to the pharmacy for assistance with medication management. The Population Health team is pleased to engage with this patient at any time in the future upon receipt of referral and should he/she be interested in assistance from the [facility] team.   Harlon Flor, PharmD Clinical Pharmacist  [phone]

## 2023-08-21 ENCOUNTER — Telehealth (INDEPENDENT_AMBULATORY_CARE_PROVIDER_SITE_OTHER): Payer: Self-pay | Admitting: Psychiatry

## 2023-08-21 ENCOUNTER — Encounter (HOSPITAL_COMMUNITY): Payer: Self-pay

## 2023-08-21 DIAGNOSIS — Z91199 Patient's noncompliance with other medical treatment and regimen due to unspecified reason: Secondary | ICD-10-CM

## 2023-08-21 LAB — HM DIABETES EYE EXAM

## 2023-08-24 NOTE — Progress Notes (Signed)
 No show

## 2023-08-28 ENCOUNTER — Encounter (HOSPITAL_COMMUNITY): Payer: Self-pay

## 2023-08-28 ENCOUNTER — Telehealth (INDEPENDENT_AMBULATORY_CARE_PROVIDER_SITE_OTHER): Payer: Self-pay | Admitting: Psychiatry

## 2023-08-28 DIAGNOSIS — Z91199 Patient's noncompliance with other medical treatment and regimen due to unspecified reason: Secondary | ICD-10-CM

## 2023-08-30 ENCOUNTER — Other Ambulatory Visit (HOSPITAL_COMMUNITY): Payer: Self-pay | Admitting: Psychiatry

## 2023-08-30 ENCOUNTER — Other Ambulatory Visit: Payer: Self-pay | Admitting: Nurse Practitioner

## 2023-08-30 DIAGNOSIS — I10 Essential (primary) hypertension: Secondary | ICD-10-CM

## 2023-08-31 ENCOUNTER — Encounter (HOSPITAL_COMMUNITY): Payer: Self-pay | Admitting: Psychiatry

## 2023-08-31 NOTE — Progress Notes (Signed)
 No show

## 2023-09-13 ENCOUNTER — Other Ambulatory Visit: Payer: Self-pay | Admitting: Nurse Practitioner

## 2023-09-13 DIAGNOSIS — E1165 Type 2 diabetes mellitus with hyperglycemia: Secondary | ICD-10-CM

## 2023-09-18 ENCOUNTER — Telehealth: Payer: Self-pay | Admitting: Nurse Practitioner

## 2023-09-18 NOTE — Telephone Encounter (Signed)
 Patient was identified as falling into the [place] Measure - Diabetes.   Patient was: Appointment scheduled with primary care provider in the next 30 days.  Appt 10/23/2023

## 2023-10-09 ENCOUNTER — Other Ambulatory Visit: Payer: Self-pay | Admitting: Family Medicine

## 2023-10-09 DIAGNOSIS — E039 Hypothyroidism, unspecified: Secondary | ICD-10-CM

## 2023-10-09 DIAGNOSIS — E782 Mixed hyperlipidemia: Secondary | ICD-10-CM

## 2023-10-09 DIAGNOSIS — J4540 Moderate persistent asthma, uncomplicated: Secondary | ICD-10-CM

## 2023-10-09 DIAGNOSIS — K219 Gastro-esophageal reflux disease without esophagitis: Secondary | ICD-10-CM

## 2023-10-09 DIAGNOSIS — I10 Essential (primary) hypertension: Secondary | ICD-10-CM

## 2023-10-09 DIAGNOSIS — E1165 Type 2 diabetes mellitus with hyperglycemia: Secondary | ICD-10-CM

## 2023-10-23 ENCOUNTER — Other Ambulatory Visit: Payer: Self-pay | Admitting: Nurse Practitioner

## 2023-10-23 ENCOUNTER — Ambulatory Visit (INDEPENDENT_AMBULATORY_CARE_PROVIDER_SITE_OTHER): Payer: Self-pay | Admitting: Nurse Practitioner

## 2023-10-23 ENCOUNTER — Encounter: Payer: Self-pay | Admitting: Nurse Practitioner

## 2023-10-23 VITALS — BP 140/88 | HR 96 | Temp 97.0°F | Wt 159.0 lb

## 2023-10-23 DIAGNOSIS — G629 Polyneuropathy, unspecified: Secondary | ICD-10-CM

## 2023-10-23 DIAGNOSIS — E1165 Type 2 diabetes mellitus with hyperglycemia: Secondary | ICD-10-CM

## 2023-10-23 DIAGNOSIS — E039 Hypothyroidism, unspecified: Secondary | ICD-10-CM

## 2023-10-23 DIAGNOSIS — E785 Hyperlipidemia, unspecified: Secondary | ICD-10-CM

## 2023-10-23 DIAGNOSIS — Z91199 Patient's noncompliance with other medical treatment and regimen due to unspecified reason: Secondary | ICD-10-CM

## 2023-10-23 DIAGNOSIS — F4310 Post-traumatic stress disorder, unspecified: Secondary | ICD-10-CM

## 2023-10-23 DIAGNOSIS — I10 Essential (primary) hypertension: Secondary | ICD-10-CM

## 2023-10-23 DIAGNOSIS — N644 Mastodynia: Secondary | ICD-10-CM

## 2023-10-23 DIAGNOSIS — Z794 Long term (current) use of insulin: Secondary | ICD-10-CM

## 2023-10-23 DIAGNOSIS — F259 Schizoaffective disorder, unspecified: Secondary | ICD-10-CM

## 2023-10-23 DIAGNOSIS — G43009 Migraine without aura, not intractable, without status migrainosus: Secondary | ICD-10-CM

## 2023-10-23 LAB — POCT GLYCOSYLATED HEMOGLOBIN (HGB A1C): Hemoglobin A1C: 10.7 % — AB (ref 4.0–5.6)

## 2023-10-23 MED ORDER — TRULICITY 1.5 MG/0.5ML ~~LOC~~ SOAJ: 1.5000 mg | SUBCUTANEOUS | 0 refills | Status: DC

## 2023-10-23 MED ORDER — RIZATRIPTAN BENZOATE 10 MG PO TABS: 10.0000 mg | ORAL_TABLET | ORAL | 2 refills | Status: DC | PRN

## 2023-10-23 MED ORDER — AMLODIPINE BESYLATE 10 MG PO TABS: 10.0000 mg | ORAL_TABLET | Freq: Every day | ORAL | 0 refills | Status: DC

## 2023-10-23 MED ORDER — GABAPENTIN 300 MG PO CAPS: 300.0000 mg | ORAL_CAPSULE | Freq: Every day | ORAL | 2 refills | Status: DC

## 2023-10-23 MED ORDER — TRULICITY 0.75 MG/0.5ML ~~LOC~~ SOAJ: 0.7500 mg | SUBCUTANEOUS | 0 refills | Status: DC

## 2023-10-23 MED ORDER — CARVEDILOL 12.5 MG PO TABS
12.5000 mg | ORAL_TABLET | Freq: Two times a day (BID) | ORAL | 1 refills | Status: AC
Start: 2023-10-23 — End: ?

## 2023-10-23 MED ORDER — TRESIBA FLEXTOUCH 100 UNIT/ML ~~LOC~~ SOPN: 80.0000 [IU] | PEN_INJECTOR | Freq: Every day | SUBCUTANEOUS | 3 refills | Status: DC

## 2023-10-23 MED ORDER — CEPHALEXIN 500 MG PO CAPS: 500.0000 mg | ORAL_CAPSULE | Freq: Three times a day (TID) | ORAL | 0 refills | Status: AC

## 2023-10-23 MED ORDER — FLUOXETINE HCL 40 MG PO CAPS: 40.0000 mg | ORAL_CAPSULE | Freq: Every day | ORAL | 1 refills | Status: DC

## 2023-10-23 MED ORDER — BENZTROPINE MESYLATE 1 MG PO TABS: ORAL_TABLET | ORAL | 0 refills | Status: DC

## 2023-10-23 MED ORDER — GLIPIZIDE 10 MG PO TABS: 10.0000 mg | ORAL_TABLET | Freq: Two times a day (BID) | ORAL | 3 refills | Status: DC

## 2023-10-23 MED ORDER — DEXCOM G7 SENSOR MISC: 3 refills | Status: DC

## 2023-10-23 NOTE — Assessment & Plan Note (Signed)
 Need to take medications daily as ordered discussed Patient referred to the clinical pharmacist

## 2023-10-23 NOTE — Assessment & Plan Note (Signed)
Continue gabapentin 300 mg daily. ?

## 2023-10-23 NOTE — Assessment & Plan Note (Signed)
 Encouraged to report persistent tenderness after completion of antibiotics, will at that time refer the patient for a diagnostic mammogram/ultrasound - cephALEXin  (KEFLEX ) 500 MG capsule; Take 1 capsule (500 mg total) by mouth 3 (three) times daily for 10 days.  Dispense: 30 capsule; Refill: 0

## 2023-10-23 NOTE — Assessment & Plan Note (Signed)
 Lab Results  Component Value Date   HGBA1C 10.7 (A) 10/23/2023  Chronic uncontrolled condition Restart Tresiba 80 units daily Restart Trulicity  taking 0.05 mg once weekly for 4 weeks after 4 weeks increase to 1.5 mg once weekly Continue glipizide  10 mg twice daily Patient counseled on low-carb diet CBG goals discussed She was referred to the clinical pharmacist for medication management but the have not been able to reach out to schedule an appointment, patient was encouraged to be expecting their call, she is only available on weekends Patient counseled on low-carb diet, need to get diabetes under control discussed I encouraged the patient's husband to assist with ensuring that the patient takes her medications as ordered Follow-up in 4 weeks

## 2023-10-23 NOTE — Assessment & Plan Note (Signed)
 Continue atorvastatin 10 mg daily Avoid fatty fried foods

## 2023-10-23 NOTE — Patient Instructions (Signed)
 Goal for fasting blood sugar ranges from 80 to 120 and 2 hours after any meal or at bedtime should be between 130 to 170.     Around 3 times per week, check your blood pressure 2 times per day. once in the morning and once in the evening. The readings should be at least one minute apart. Write down these values and bring them to your next nurse visit/appointment.  When you check your BP, make sure you have been doing something calm/relaxing 5 minutes prior to checking. Both feet should be flat on the floor and you should be sitting. Use your left arm and make sure it is in a relaxed position (on a table), and that the cuff is at the approximate level/height of your heart. Blood pressure goal is less than 130/80    1. Uncontrolled type 2 diabetes mellitus with hyperglycemia, with long-term current use of insulin  (HCC) (Primary)  - POCT glycosylated hemoglobin (Hb A1C) - glipiZIDE  (GLUCOTROL ) 10 MG tablet; Take 1 tablet (10 mg total) by mouth 2 (two) times daily before a meal.  Dispense: 180 tablet; Refill: 3 - Dulaglutide  (TRULICITY ) 0.75 MG/0.5ML SOAJ; Inject 0.75 mg into the skin once a week.  Dispense: 2 mL; Refill: 0 - Dulaglutide  (TRULICITY ) 1.5 MG/0.5ML SOAJ; Inject 1.5 mg into the skin once a week.  Dispense: 2 mL; Refill: 0 - insulin  degludec (TRESIBA FLEXTOUCH) 100 UNIT/ML FlexTouch Pen; Inject 80 Units into the skin at bedtime.  Dispense: 15 mL; Refill: 3 - Basic Metabolic Panel - Continuous Glucose Sensor (DEXCOM G7 SENSOR) MISC; Please use as directed.  Dispense: 3 each; Refill: 3  2. Polyneuropathy  - gabapentin  (NEURONTIN ) 300 MG capsule; Take 1 capsule (300 mg total) by mouth daily.  Dispense: 90 capsule; Refill: 2  3. Essential hypertension, benign  - carvedilol  (COREG ) 12.5 MG tablet; Take 1 tablet (12.5 mg total) by mouth 2 (two) times daily.  Dispense: 180 tablet; Refill: 1 - amLODipine  (NORVASC ) 10 MG tablet; Take 1 tablet (10 mg total) by mouth daily.  Dispense: 90  tablet; Refill: 0  4. PTSD (post-traumatic stress disorder)  - FLUoxetine  (PROZAC ) 40 MG capsule; Take 1 capsule (40 mg total) by mouth daily.  Dispense: 90 capsule; Refill: 1 - Ambulatory referral to Psychiatry  5. Migraine without aura and without status migrainosus, not intractable  - rizatriptan  (MAXALT ) 10 MG tablet; Take 1 tablet (10 mg total) by mouth as needed for migraine. May repeat in 2 hours if needed  Dispense: 10 tablet; Refill: 2  6. Hypothyroidism, unspecified type  - TSH + free T4   It is important that you exercise regularly at least 30 minutes 5 times a week as tolerated  Think about what you will eat, plan ahead. Choose " clean, green, fresh or frozen" over canned, processed or packaged foods which are more sugary, salty and fatty. 70 to 75% of food eaten should be vegetables and fruit. Three meals at set times with snacks allowed between meals, but they must be fruit or vegetables. Aim to eat over a 12 hour period , example 7 am to 7 pm, and STOP after  your last meal of the day. Drink water ,generally about 64 ounces per day, no other drink is as healthy. Fruit juice is best enjoyed in a healthy way, by EATING the fruit.  Thanks for choosing [facility] we consider it a privelige to serve you.

## 2023-10-23 NOTE — Assessment & Plan Note (Signed)
 Start amlodipine  10 mg daily continue carvedilol  12.5 mg twice daily, losartan  100 mg daily Monitor blood pressure at home and reports hypotension DASH diet and commitment to daily physical activity for a minimum of 30 minutes discussed and encouraged, as a part of hypertension management. The importance of attaining a healthy weight is also discussed. Follow-up in 4 weeks     10/23/2023   10:05 AM 10/23/2023    9:50 AM 07/27/2023    4:01 PM 07/27/2023    3:41 PM 07/24/2023    9:19 AM 07/24/2023    9:03 AM 02/24/2023   11:14 AM  BP/Weight  Systolic BP 140 142 124 136 142 151 108  Diastolic BP 88 88 75 88 74 82 80  Wt. (Lbs)  159  165.4  153   BMI  31.05 kg/m2  32.3 kg/m2  30.64 kg/m2

## 2023-10-23 NOTE — Assessment & Plan Note (Signed)
 Continue Prozac  40 mg daily, risperidone  2 mg in the morning and 6 mg in the evening, trazodone  300 mg at bedtime for sleep and clonazepam  0.5 mg twice daily for anxiety Patient referred to psychiatrist

## 2023-10-23 NOTE — Progress Notes (Signed)
 Established Patient Office Visit  Subjective:  Patient ID: Kathryn [name], female    DOB: [birth year]  Age: 53 y.o. MRN: [id number]  CC:  Chief Complaint  Patient presents with   Hypertension   Diabetes   Mass    Under left breast     HPI Kathryn [name] is a 53 y.o. female.  has a past medical history of Anemia, Anxiety, Asthma, Depression, GERD (gastroesophageal reflux disease), HLD (hyperlipidemia) (04/07/2019), Hypertension, Hypothyroidism, adult (04/07/2019), Neuropathy, Obesity (BMI 30.0-34.9) (04/07/2019), Schizoaffective disorder (HCC), and Type II diabetes mellitus, uncontrolled (04/27/2019).  Patient presents for follow-up for her chronic medical conditions.  She is accompanied by her husband  Hypertension.  Currently on carvedilol  12.5 mg twice daily, amlodipine  5 mg daily, losartan  100 mg daily.  Please of chest pain, shortness of breath, edema  Uncontrolled type 2 diabetes.  Currently on glipizide  10 mg twice daily, never filled the prescription for Jardiance  because it was too expensive.  She has not been taking Tresiba and Trulicity  for months.  Has no recent follow-up taking the medications, she has been trying to follow a low-carb diet avoiding soda, juice but still drinks sweet tea.   Patient complaining of pain painful mass on her left breast that she first noticed some weeks ago.  Has a family history of breast cancer, her last mammogram was normal.  She has been dismissed by her psychiatrist due to no-shows, needs a referral to a new psychiatrist       Past Medical History:  Diagnosis Date   Anemia    Anxiety    Asthma    Depression    GERD (gastroesophageal reflux disease)    HLD (hyperlipidemia) 04/07/2019   Hypertension    Hypothyroidism, adult 04/07/2019   Neuropathy    Obesity (BMI 30.0-34.9) 04/07/2019   Schizoaffective disorder (HCC)    Type II diabetes mellitus, uncontrolled 04/27/2019    Past Surgical History:  Procedure Laterality Date    BACK SURGERY  09/06/2020   BIOPSY  05/03/2021   Procedure: BIOPSY;  Surgeon: Urban Garden, MD;  [facility];  Service: Gastroenterology;;   BREAST SURGERY Right    biopsy   CESAREAN SECTION     CHOLECYSTECTOMY  06/02/2012   Procedure: LAPAROSCOPIC CHOLECYSTECTOMY;  Surgeon: Beau Bound, MD;  Location: [facility];  Service: General;  Laterality: N/A;  Attempted laparoscopic cholecystectomy   CHOLECYSTECTOMY  06/02/2012   Procedure: CHOLECYSTECTOMY;  Surgeon: Beau Bound, MD;  Location: [facility];  Service: General;  Laterality: N/A;  converted to open at  0905   COLONOSCOPY WITH PROPOFOL  N/A 07/18/2020   prep was fair, one 4mm polyp (inflammatory) stool in descending colon, transverse and ascending, distal rectum and anal verge normal   ESOPHAGEAL DILATION  12/31/2018   Procedure: ESOPHAGEAL DILATION;  Surgeon: Ruby Corporal, MD;  [facility];  Service: Endoscopy;;   ESOPHAGOGASTRODUODENOSCOPY (EGD) WITH PROPOFOL  N/A 08/24/2015   Procedure: ESOPHAGOGASTRODUODENOSCOPY (EGD) WITH PROPOFOL ;  Surgeon: Ruby Corporal, MD;  [facility];  Service: Endoscopy;  Laterality: N/A;  1:10 - Ann to notify pt to arrive at 11:30   ESOPHAGOGASTRODUODENOSCOPY (EGD) WITH PROPOFOL  N/A 12/31/2018   normal, no abnormality to explain dysphagia, esophagus dilated.   ESOPHAGOGASTRODUODENOSCOPY (EGD) WITH PROPOFOL  N/A 05/03/2021   Procedure: ESOPHAGOGASTRODUODENOSCOPY (EGD) WITH PROPOFOL ;  Surgeon: Urban Garden, MD;  [facility];  Service: Gastroenterology;  Laterality: N/A;  1:20   FLEXIBLE SIGMOIDOSCOPY  07/17/2020  Procedure: FLEXIBLE SIGMOIDOSCOPY;  Surgeon: Umberto Ganong, Bearl Limes, MD;  [facility];  Service: Gastroenterology;;   POLYPECTOMY  07/18/2020   Procedure: POLYPECTOMY INTESTINAL;  Surgeon: Umberto Ganong, Bearl Limes, MD;  [facility];  Service: Gastroenterology;;  ascending colon polyp;    SAVORY DILATION   05/03/2021   Procedure: SAVORY DILATION;  Surgeon: Urban Garden, MD;  [facility];  Service: Gastroenterology;;   tooth removal  2019   all teeth removed   TUBAL LIGATION     X3    Family History  Problem Relation Age of Onset   Hypertension Mother    Alcohol abuse Mother    Heart disease Mother    Kidney disease Mother    Aneurysm Mother        brain   Hypertension Father    Alcohol abuse Sister    Alcohol abuse Maternal Aunt    Cancer Maternal Aunt    Alcohol abuse Paternal Aunt    Alcohol abuse Maternal Grandmother    Alcohol abuse Maternal Grandfather    Hearing loss Daughter    Alcohol abuse Cousin    Stroke Other    Diabetes Other    Cancer Other    Seizures Other     Social History   Socioeconomic History   Marital status: Married    Spouse name: Kathryn [name]   Number of children: 3   Years of education: 12   Highest education level: Not on file  Occupational History    Comment: [employer]    Comment: 3rd shift  Tobacco Use   Smoking status: Never    Passive exposure: Never   Smokeless tobacco: Never  Vaping Use   Vaping status: Never Used  Substance and Sexual Activity   Alcohol use: No    Alcohol/week: 0.0 standard drinks of alcohol   Drug use: No   Sexual activity: Not Currently    Birth control/protection: Surgical  Other Topics Concern   Not on file  Social History Narrative   Married for 22 years.On disability secondary to schizophrenia.   Lives with husband.   Caffeine- decaf coffee, tea   Social Drivers of Corporate investment banker Strain: Not on file  Food Insecurity: Not on file  Transportation Needs: Not on file  Physical Activity: Not on file  Stress: Not on file  Social Connections: Not on file  Intimate Partner Violence: Not on file    Outpatient Medications Prior to Visit  Medication Sig Dispense Refill   albuterol  (VENTOLIN  HFA) 108 (90 Base) MCG/ACT inhaler Inhale 2 puffs into the lungs every 6 (six) hours as  needed for wheezing or shortness of breath. 8 g 2   aspirin  EC 81 MG tablet Take 81 mg by mouth daily.     atorvastatin  (LIPITOR) 10 MG tablet Take 1 tablet (10 mg total) by mouth daily. 90 tablet 2   budesonide -formoterol  (SYMBICORT ) 80-4.5 MCG/ACT inhaler INHALE 2 PUFFS INTO THE LUNGS TWICE A DAY 10.2 each 3   Cholecalciferol (VITAMIN D -3) 125 MCG (5000 UT) TABS Take 2 tablets by mouth daily. 30 tablet 1   clonazePAM  (KLONOPIN ) 0.5 MG tablet Take 1 tablet (0.5 mg total) by mouth 2 (two) times daily as needed for anxiety. 180 tablet 0   Continuous Blood Gluc Receiver (DEXCOM G7 RECEIVER) DEVI USED TO MONITOR BLOOD GLUCOSE DX: E11.65 1 each 0   Continuous Blood Gluc Sensor (DEXCOM G7 SENSOR) MISC Used to monitor blood glucose DX E11.65 1 each 3  DULoxetine  (CYMBALTA ) 60 MG capsule TAKE 1 CAPSULE BY MOUTH 2 TIMES DAILY. 180 capsule 2   levothyroxine  (SYNTHROID ) 75 MCG tablet TAKE 1 TABLET BY MOUTH DAILY BEFORE BREAKFAST. 90 tablet 1   losartan  (COZAAR ) 100 MG tablet Take 1 tablet (100 mg total) by mouth daily. 90 tablet 1   metoCLOPramide  (REGLAN ) 5 MG tablet Take 1 tablet (5 mg total) by mouth every 6 (six) hours as needed for nausea. 40 tablet 0   Multiple Vitamin (MULTIVITAMIN) tablet Take 1 tablet by mouth daily.     omeprazole  (PRILOSEC) 40 MG capsule Take 1 capsule (40 mg total) by mouth 2 (two) times daily. 180 capsule 3   risperiDONE  (RISPERDAL ) 2 MG tablet TAKE 1 TABLET EVERY MORNING AND 3 TABLETS AT BEDTIME 360 tablet 3   topiramate  (TOPAMAX ) 100 MG tablet TAKE 1 TABLET BY MOUTH EVERYDAY AT BEDTIME 90 tablet 1   traMADol  (ULTRAM ) 50 MG tablet Take 50 mg by mouth every 6 (six) hours as needed.     traZODone  (DESYREL ) 100 MG tablet TAKE 2 TABLETS(200 MG) BY MOUTH AT BEDTIME 180 tablet 2   amLODipine  (NORVASC ) 5 MG tablet TAKE 1 TABLET (5 MG TOTAL) BY MOUTH DAILY. 90 tablet 1   benztropine  (COGENTIN ) 1 MG tablet TAKE 1 TABLET(1 MG) BY MOUTH AT BEDTIME 90 tablet 2   carvedilol  (COREG ) 12.5  MG tablet Take 1 tablet (12.5 mg total) by mouth 2 (two) times daily. 180 tablet 1   FLUoxetine  (PROZAC ) 40 MG capsule Take 1 capsule (40 mg total) by mouth daily. 30 capsule 2   gabapentin  (NEURONTIN ) 300 MG capsule Take 1 capsule (300 mg total) by mouth daily. 90 capsule 1   glipiZIDE  (GLUCOTROL ) 10 MG tablet TAKE 1 TABLET (10 MG TOTAL) BY MOUTH TWICE A DAY BEFORE A MEAL 180 tablet 1   rizatriptan  (MAXALT ) 10 MG tablet Take 1 tablet (10 mg total) by mouth as needed for migraine. May repeat in 2 hours if needed 10 tablet 0   blood glucose meter kit and supplies Dispense based on patient and insurance preference. Once daily testing DX E11.9 (Patient not taking: Reported on 07/24/2023) 1 each 0   Blood Glucose Monitoring Suppl (ACCU-CHEK GUIDE ME) w/Device KIT 1 Piece by Does not apply route as directed. (Patient not taking: Reported on 07/24/2023) 1 kit 0   glucose blood (ACCU-CHEK GUIDE) test strip Use as instructed (Patient not taking: Reported on 10/27/2022) 150 each 2   LINZESS  290 MCG CAPS capsule Take 1 capsule (290 mcg total) by mouth daily. (Patient not taking: Reported on 10/23/2023) 90 capsule 1   sucralfate  (CARAFATE ) 1 GM/10ML suspension Take 10 mLs (1 g total) by mouth 4 (four) times daily -  with meals and at bedtime. (Patient not taking: Reported on 10/23/2023) 420 mL 1   Continuous Glucose Sensor (DEXCOM G7 SENSOR) MISC Please use as directed. 3 each 3   doxycycline  (VIBRA -TABS) 100 MG tablet Take 1 tablet (100 mg total) by mouth 2 (two) times daily. (Patient not taking: Reported on 10/23/2023) 14 tablet 0   Dulaglutide  (TRULICITY ) 4.5 MG/0.5ML SOPN Inject 4.5 mg as directed once a week. (Patient not taking: Reported on 10/23/2023) 2 mL 3   empagliflozin  (JARDIANCE ) 10 MG TABS tablet Take 1 tablet (10 mg total) by mouth daily before breakfast. (Patient not taking: Reported on 10/23/2023) 30 tablet 3   insulin  degludec (TRESIBA  FLEXTOUCH) 100 UNIT/ML FlexTouch Pen Inject 80 Units into the skin at  bedtime. (Patient not taking: Reported on  10/23/2023) 15 mL 3   penicillin v potassium (VEETID) 500 MG tablet Take 500 mg by mouth 4 (four) times daily. (Patient not taking: Reported on 10/23/2023)     Polyvinyl Alcohol-Povidone PF (REFRESH) 1.4-0.6 % SOLN Place 1 drop into both eyes daily as needed (dry eyes).     No facility-administered medications prior to visit.    Allergies  Allergen Reactions   Indian Bread [China Root (Wolfiporia Cocos)]     PATIENT DENIES    ROS Review of Systems  Constitutional:  Negative for appetite change, chills, fatigue and fever.  HENT:  Negative for congestion, postnasal drip, rhinorrhea and sneezing.   Respiratory:  Negative for cough, shortness of breath and wheezing.   Cardiovascular:  Negative for chest pain, palpitations and leg swelling.  Gastrointestinal:  Negative for abdominal pain, constipation, nausea and vomiting.  Genitourinary:  Negative for difficulty urinating, dysuria, flank pain and frequency.  Musculoskeletal:  Negative for arthralgias, back pain, joint swelling and myalgias.  Skin:  Negative for color change, pallor, rash and wound.  Neurological:  Negative for dizziness, facial asymmetry, weakness, numbness and headaches.  Psychiatric/Behavioral:  Negative for behavioral problems, confusion, self-injury and suicidal ideas.       Objective:     Physical Exam Vitals and nursing note reviewed. Exam conducted with a chaperone present.  Constitutional:      General: She is not in acute distress.    Appearance: Normal appearance. She is not ill-appearing, toxic-appearing or diaphoretic.  Eyes:     General: No scleral icterus.       Right eye: No discharge.        Left eye: No discharge.     Conjunctiva/sclera: Conjunctivae normal.  Cardiovascular:     Rate and Rhythm: Normal rate and regular rhythm.     Pulses: Normal pulses.     Heart sounds: Normal heart sounds. No murmur heard.    No friction rub. No gallop.  Pulmonary:      Effort: Pulmonary effort is normal. No respiratory distress.     Breath sounds: Normal breath sounds. No stridor. No wheezing, rhonchi or rales.  Chest:     Chest wall: No tenderness.  Breasts:    Right: No nipple discharge or skin change.     Left: Mass and tenderness present. No nipple discharge.     Comments: A small tender nodule noted below the left breast. Abdominal:     General: There is no distension.     Palpations: Abdomen is soft.     Tenderness: There is no abdominal tenderness. There is no right CVA tenderness, left CVA tenderness or guarding.  Musculoskeletal:        General: No swelling, tenderness, deformity or signs of injury.     Right lower leg: No edema.     Left lower leg: No edema.  Skin:    General: Skin is warm and dry.     Capillary Refill: Capillary refill takes less than 2 seconds.     Coloration: Skin is not jaundiced or pale.     Findings: No bruising, erythema or lesion.  Neurological:     Mental Status: She is alert and oriented to person, place, and time.     Motor: No weakness.     Coordination: Coordination normal.     Gait: Gait normal.  Psychiatric:        Mood and Affect: Mood normal.        Behavior: Behavior normal.  Thought Content: Thought content normal.        Judgment: Judgment normal.     BP (!) 140/88   Pulse 96   Temp (!) 97 F (36.1 C)   Wt 159 lb (72.1 kg)   LMP 01/23/2013   SpO2 100%   BMI 31.05 kg/m  Wt Readings from Last 3 Encounters:  10/23/23 159 lb (72.1 kg)  07/27/23 165 lb 6.4 oz (75 kg)  07/24/23 153 lb (69.4 kg)    Lab Results  Component Value Date   TSH 0.445 (L) 07/24/2023   Lab Results  Component Value Date   WBC 6.1 10/27/2022   HGB 11.1 10/27/2022   HCT 33.3 (L) 10/27/2022   MCV 85 10/27/2022   PLT 207 10/27/2022   Lab Results  Component Value Date   NA 137 07/24/2023   K 4.2 07/24/2023   CO2 20 07/24/2023   GLUCOSE 470 (H) 07/24/2023   BUN 15 07/24/2023   CREATININE 0.93  07/24/2023   BILITOT 0.9 07/24/2023   ALKPHOS 118 07/24/2023   AST 16 07/24/2023   ALT 25 07/24/2023   PROT 7.6 07/24/2023   ALBUMIN 4.6 07/24/2023   CALCIUM  10.1 07/24/2023   ANIONGAP 7 07/17/2021   EGFR 74 07/24/2023   Lab Results  Component Value Date   CHOL 98 (L) 02/25/2023   Lab Results  Component Value Date   HDL 48 02/25/2023   Lab Results  Component Value Date   LDLCALC 35 02/25/2023   Lab Results  Component Value Date   TRIG 71 02/25/2023   Lab Results  Component Value Date   CHOLHDL 2.0 02/25/2023   Lab Results  Component Value Date   HGBA1C 10.7 (A) 10/23/2023      Assessment & Plan:   Problem List Items Addressed This Visit       Cardiovascular and Mediastinum   Essential hypertension, benign   Start amlodipine  10 mg daily continue carvedilol  12.5 mg twice daily, losartan  100 mg daily Monitor blood pressure at home and reports hypotension DASH diet and commitment to daily physical activity for a minimum of 30 minutes discussed and encouraged, as a part of hypertension management. The importance of attaining a healthy weight is also discussed. Follow-up in 4 weeks     10/23/2023   10:05 AM 10/23/2023    9:50 AM 07/27/2023    4:01 PM 07/27/2023    3:41 PM 07/24/2023    9:19 AM 07/24/2023    9:03 AM 02/24/2023   11:14 AM  BP/Weight  Systolic BP 140 142 124 136 142 151 108  Diastolic BP 88 88 75 88 74 82 80  Wt. (Lbs)  159  165.4  153   BMI  31.05 kg/m2  32.3 kg/m2  30.64 kg/m2            Relevant Medications   carvedilol  (COREG ) 12.5 MG tablet   amLODipine  (NORVASC ) 10 MG tablet   Migraine   Relevant Medications   gabapentin  (NEURONTIN ) 300 MG capsule   carvedilol  (COREG ) 12.5 MG tablet   FLUoxetine  (PROZAC ) 40 MG capsule   rizatriptan  (MAXALT ) 10 MG tablet   amLODipine  (NORVASC ) 10 MG tablet     Endocrine   Hypothyroidism   Relevant Medications   carvedilol  (COREG ) 12.5 MG tablet   Other Relevant Orders   TSH + free T4    Uncontrolled type 2 diabetes mellitus with hyperglycemia, with long-term current use of insulin  (HCC) - Primary   Lab Results  Component Value Date  HGBA1C 10.7 (A) 10/23/2023  Chronic uncontrolled condition Restart Tresiba  80 units daily Restart Trulicity  taking 0.05 mg once weekly for 4 weeks after 4 weeks increase to 1.5 mg once weekly Continue glipizide  10 mg twice daily Patient counseled on low-carb diet CBG goals discussed She was referred to the clinical pharmacist for medication management but the have not been able to reach out to schedule an appointment, patient was encouraged to be expecting their call, she is only available on weekends Patient counseled on low-carb diet, need to get diabetes under control discussed I encouraged the patient's husband to assist with ensuring that the patient takes her medications as ordered Follow-up in 4 weeks      Relevant Medications   glipiZIDE  (GLUCOTROL ) 10 MG tablet   Dulaglutide  (TRULICITY ) 0.75 MG/0.5ML SOAJ   Dulaglutide  (TRULICITY ) 1.5 MG/0.5ML SOAJ (Start on 11/20/2023)   insulin  degludec (TRESIBA  FLEXTOUCH) 100 UNIT/ML FlexTouch Pen   Continuous Glucose Sensor (DEXCOM G7 SENSOR) MISC   Other Relevant Orders   POCT glycosylated hemoglobin (Hb A1C) (Completed)   Basic Metabolic Panel     Nervous and Auditory   Polyneuropathy   Continue gabapentin  300 mg daily      Relevant Medications   gabapentin  (NEURONTIN ) 300 MG capsule   FLUoxetine  (PROZAC ) 40 MG capsule   benztropine  (COGENTIN ) 1 MG tablet     Other   Schizoaffective disorder (HCC)   Continue Prozac  40 mg daily, risperidone  2 mg in the morning and 6 mg in the evening, trazodone  300 mg at bedtime for sleep and clonazepam  0.5 mg twice daily for anxiety Patient referred to psychiatrist         Relevant Medications   benztropine  (COGENTIN ) 1 MG tablet   PTSD (post-traumatic stress disorder)   Relevant Medications   FLUoxetine  (PROZAC ) 40 MG capsule   Other  Relevant Orders   Ambulatory referral to Psychiatry   Dyslipidemia, goal LDL below 70   Continue atorvastatin  10 mg daily Avoid fatty fried foods      Relevant Medications   carvedilol  (COREG ) 12.5 MG tablet   amLODipine  (NORVASC ) 10 MG tablet   Non-adherence to medical treatment   Need to take medications daily as ordered discussed Patient referred to the clinical pharmacist      Breast tenderness   Encouraged to report persistent tenderness after completion of antibiotics, will at that time refer the patient for a diagnostic mammogram/ultrasound - cephALEXin  (KEFLEX ) 500 MG capsule; Take 1 capsule (500 mg total) by mouth 3 (three) times daily for 10 days.  Dispense: 30 capsule; Refill: 0       Relevant Medications   cephALEXin  (KEFLEX ) 500 MG capsule    Meds ordered this encounter  Medications   gabapentin  (NEURONTIN ) 300 MG capsule    Sig: Take 1 capsule (300 mg total) by mouth daily.    Dispense:  90 capsule    Refill:  2   carvedilol  (COREG ) 12.5 MG tablet    Sig: Take 1 tablet (12.5 mg total) by mouth 2 (two) times daily.    Dispense:  180 tablet    Refill:  1   glipiZIDE  (GLUCOTROL ) 10 MG tablet    Sig: Take 1 tablet (10 mg total) by mouth 2 (two) times daily before a meal.    Dispense:  180 tablet    Refill:  3   FLUoxetine  (PROZAC ) 40 MG capsule    Sig: Take 1 capsule (40 mg total) by mouth daily.    Dispense:  90 capsule  Refill:  1   rizatriptan  (MAXALT ) 10 MG tablet    Sig: Take 1 tablet (10 mg total) by mouth as needed for migraine. May repeat in 2 hours if needed    Dispense:  10 tablet    Refill:  2   Dulaglutide  (TRULICITY ) 0.75 MG/0.5ML SOAJ    Sig: Inject 0.75 mg into the skin once a week.    Dispense:  2 mL    Refill:  0   Dulaglutide  (TRULICITY ) 1.5 MG/0.5ML SOAJ    Sig: Inject 1.5 mg into the skin once a week.    Dispense:  2 mL    Refill:  0   insulin  degludec (TRESIBA FLEXTOUCH) 100 UNIT/ML FlexTouch Pen    Sig: Inject 80 Units into the  skin at bedtime.    Dispense:  15 mL    Refill:  3   amLODipine  (NORVASC ) 10 MG tablet    Sig: Take 1 tablet (10 mg total) by mouth daily.    Dispense:  90 tablet    Refill:  0   Continuous Glucose Sensor (DEXCOM G7 SENSOR) MISC    Sig: Please use as directed.    Dispense:  3 each    Refill:  3   cephALEXin  (KEFLEX ) 500 MG capsule    Sig: Take 1 capsule (500 mg total) by mouth 3 (three) times daily for 10 days.    Dispense:  30 capsule    Refill:  0   benztropine  (COGENTIN ) 1 MG tablet    Sig: TAKE 1 TABLET(1 MG) BY MOUTH AT BEDTIME    Dispense:  90 tablet    Refill:  0    Follow-up: Return in about 4 weeks (around 11/20/2023) for DM, HTN.    Calleen Alvis R Brigido Mera, FNP

## 2023-10-24 LAB — BASIC METABOLIC PANEL WITH GFR
BUN/Creatinine Ratio: 14 (ref 9–23)
BUN: 15 mg/dL (ref 6–24)
CO2: 19 mmol/L — ABNORMAL LOW (ref 20–29)
Calcium: 10 mg/dL (ref 8.7–10.2)
Chloride: 102 mmol/L (ref 96–106)
Creatinine, Ser: 1.05 mg/dL — ABNORMAL HIGH (ref 0.57–1.00)
Glucose: 305 mg/dL — ABNORMAL HIGH (ref 70–99)
Potassium: 3.8 mmol/L (ref 3.5–5.2)
Sodium: 137 mmol/L (ref 134–144)
eGFR: 64 mL/min/{1.73_m2} (ref >59)

## 2023-10-24 LAB — TSH+FREE T4
Free T4: 1.48 ng/dL (ref 0.82–1.77)
TSH: 0.652 u[IU]/mL (ref 0.450–4.500)

## 2023-10-26 ENCOUNTER — Ambulatory Visit: Payer: Self-pay | Admitting: Nurse Practitioner

## 2023-10-26 ENCOUNTER — Other Ambulatory Visit: Payer: Self-pay | Admitting: Nurse Practitioner

## 2023-10-26 DIAGNOSIS — E039 Hypothyroidism, unspecified: Secondary | ICD-10-CM

## 2023-10-26 MED ORDER — LEVOTHYROXINE SODIUM 75 MCG PO TABS: 75.0000 ug | ORAL_TABLET | Freq: Every day | ORAL | 1 refills | Status: DC

## 2023-11-03 ENCOUNTER — Telehealth: Payer: Self-pay | Admitting: *Deleted

## 2023-11-03 NOTE — Progress Notes (Unsigned)
 Care Guide Pharmacy Note  11/03/2023 Name: KIRRAH MUSTIN MRN: [id number] DOB: 01-19-1971  Referred By: Paseda, Folashade R, FNP Reason for referral: Complex Care Management (Outreach to schedule referral with PharmD )   Scotty Cyphers Justen is a 53 y.o. year old female who is a primary care patient of Paseda, Folashade R, FNP.  Tennelle A Haislip was referred to the pharmacist for assistance related to: DMII  An unsuccessful telephone outreach was attempted today to contact the patient who was referred to the pharmacy team for assistance with medication management. Additional attempts will be made to contact the patient.  Barnie Bora  [facility]  [facility], [facility] Guide  Direct Dial : [phone]  Fax [phone]

## 2023-11-04 ENCOUNTER — Telehealth: Payer: Self-pay

## 2023-11-04 NOTE — Telephone Encounter (Signed)
 Contacted patient. Confirmed that I can call her at 8AM on Friday for a telephone appointment for diabetes management.   Arthea Larsson, PharmD PGY1 Pharmacy Resident

## 2023-11-04 NOTE — Telephone Encounter (Signed)
 Copied from CRM [id number]. Topic: General - Call Back - No Documentation >> Nov 04, 2023 12:12 PM Ivette P wrote: Reason for CRM: Pt called in due to a missed call from [facility], called [facility] and was told they will advised to give pt a call. >> Nov 04, 2023 12:14 PM Ivette P wrote: Pt stated she will be at work until 4 if the phone call from mrs. Leyna can be after 4 pm

## 2023-11-04 NOTE — Progress Notes (Signed)
 Care Guide Pharmacy Note  11/04/2023 Name: [name] MRN: [id number] DOB: 09-27-1970  Referred By: Paseda, Folashade R, FNP Reason for referral: Complex Care Management (Outreach to schedule referral with PharmD )   Kathryn [name] is a 53 y.o. year old female who is a primary care patient of Paseda, Folashade R, FNP.  [name] was referred to the pharmacist for assistance related to: DMII  Successful contact was made with the patient to discuss pharmacy services including being ready for the pharmacist to call at least 5 minutes before the scheduled appointment time and to have medication bottles and any blood pressure readings ready for review. The patient agreed to meet with the pharmacist via telephone visit on (date/time). 11/06/23 at 9:00 AM  [facility]  [facility], [facility] Guide  Direct Dial : [phone]  Fax [phone]

## 2023-11-06 ENCOUNTER — Other Ambulatory Visit: Payer: Self-pay

## 2023-11-06 ENCOUNTER — Other Ambulatory Visit (HOSPITAL_COMMUNITY): Payer: Self-pay

## 2023-11-06 DIAGNOSIS — I10 Essential (primary) hypertension: Secondary | ICD-10-CM

## 2023-11-06 DIAGNOSIS — E1165 Type 2 diabetes mellitus with hyperglycemia: Secondary | ICD-10-CM

## 2023-11-06 MED ORDER — TRUE METRIX METER W/DEVICE KIT
PACK | 0 refills | Status: AC
  Filled 2023-11-06: qty 1, 30d supply, fill #0

## 2023-11-06 MED ORDER — BASAGLAR KWIKPEN 100 UNIT/ML ~~LOC~~ SOPN
40.0000 [IU] | PEN_INJECTOR | Freq: Two times a day (BID) | SUBCUTANEOUS | 5 refills | Status: DC
  Filled 2023-11-06: qty 30, 30d supply, fill #0

## 2023-11-06 MED ORDER — TRUE METRIX BLOOD GLUCOSE TEST VI STRP
ORAL_STRIP | 11 refills | Status: AC
  Filled 2023-11-06: qty 100, 50d supply, fill #0
  Filled 2024-01-12: qty 100, 50d supply, fill #1
  Filled 2024-01-12 – 2024-02-29 (×2): qty 50, 25d supply, fill #1
  Filled 2024-03-09: qty 100, 50d supply, fill #2

## 2023-11-06 MED ORDER — AMLODIPINE BESYLATE 10 MG PO TABS
10.0000 mg | ORAL_TABLET | Freq: Every day | ORAL | 0 refills | Status: DC
  Filled 2023-11-06: qty 90, 90d supply, fill #0

## 2023-11-06 MED ORDER — DULOXETINE HCL 60 MG PO CPEP
60.0000 mg | ORAL_CAPSULE | Freq: Two times a day (BID) | ORAL | 2 refills | Status: DC
  Filled 2023-11-06: qty 180, 90d supply, fill #0

## 2023-11-06 MED ORDER — TRULICITY 0.75 MG/0.5ML ~~LOC~~ SOAJ
0.7500 mg | SUBCUTANEOUS | 0 refills | Status: DC
  Filled 2023-11-06: qty 2, 28d supply, fill #0

## 2023-11-06 MED ORDER — TRUEPLUS LANCETS 30G MISC
3 refills | Status: AC
  Filled 2023-11-06: qty 100, 50d supply, fill #0

## 2023-11-06 MED ORDER — CARVEDILOL 12.5 MG PO TABS
12.5000 mg | ORAL_TABLET | Freq: Two times a day (BID) | ORAL | 1 refills | Status: DC
  Filled 2023-11-06: qty 180, 90d supply, fill #0

## 2023-11-06 MED ORDER — TRULICITY 1.5 MG/0.5ML ~~LOC~~ SOAJ
1.5000 mg | SUBCUTANEOUS | 1 refills | Status: DC
Start: 2023-11-20 — End: 2023-11-27
  Filled 2023-11-06: qty 2, 28d supply, fill #0

## 2023-11-06 MED ORDER — ATORVASTATIN CALCIUM 10 MG PO TABS
10.0000 mg | ORAL_TABLET | Freq: Every day | ORAL | 2 refills | Status: AC
  Filled 2023-11-06: qty 90, 90d supply, fill #0
  Filled 2024-04-01: qty 90, 90d supply, fill #1

## 2023-11-06 NOTE — Progress Notes (Signed)
 Done   Barnie Bora  [facility]  [facility], [facility] Guide  Direct Dial : [phone]  Fax [phone]

## 2023-11-06 NOTE — Progress Notes (Signed)
 11/06/2023 Name: Kathryn [name] MRN: [id number] DOB: 03-Mar-1971  Chief Complaint  Patient presents with   Diabetes   Hyperlipidemia   Hypertension    Kathryn [name] is a 53 y.o. year old female who presented for a telephone visit.   They were referred to the pharmacist by their PCP for assistance in managing diabetes. PMH includes HTN, asthma, IBS, dysphagia, gastroparesis with abnormal esophagram, T2DM with neuropathy, PTSD, schizoaffective disorder, BMI > 30.    Subjective: Patient was last seen by PCP, Kathryn Lu, NP on 10/23/23. She reported taking glipizide  10 mg BID. She was not taking Jardiance  due to cost, and report she had not taken Tresiba  or Trulicity  in months. She reported trying to follow a low-carb diet and avoid soda, but still drinks sweet tea.   Today, patient reports that she did pick up Tresiba  from the pharmacy but it was > $200. She did not pick up Trulicity . She reports that she does not currently have insurance because open-enrollment at her job does not open until the fall. She has applied to Medicaid in the past, but reports it was > 2 years ago.   Care Team: Primary Care Provider: Paseda, Folashade R, FNP ; Next Scheduled Visit: 12/18/23 Behavioral Health: Kathryn Annas, MD: Missed appointment on 08/28/23: needs to reschedule   Medication Access/Adherence  Current Pharmacy:  [facility] [address], [address] [place] [address] Phone: [phone] Fax: [phone]   Patient reports affordability concerns with their medications: Yes - has no insurance until open enrollment with her job until October.  Patient reports access/transportation concerns to their pharmacy: No  Patient reports adherence concerns with their medications:  Yes  - patient has a history of stopping medications without reaching out to troubleshoot cost/adherence issues. Patient states she is taking certain medications, then  later says she does not have the medication.    Diabetes:  Current medications: insulin  degludec (Tresiba ) 80 units daily, glipizide  10 mg BID two time daily with meal, Trulicity  0.75 mg once weekly (not taking - has not been affordable without insurance).  Medications tried in the past: metformin - intolerance  Current glucose readings: N/A Using Dexcom G7 meter, but reports this is on backorder - reports that she needs to find her traditional glucometer. She has not been checking at home..   Patient denies hypoglycemic s/sx including dizziness, shakiness, sweating. Reports sweating overnight. Patient reports hyperglycemic symptoms including nocturia (all night), polyuria, polydipsia, polyphagia, nocturia, blurred vision. Denies s/sx of neuropathy.  Current meal patterns: Reports that she has been craving more potatoes.  - Drinks: reports she has been drinking mostly water  during the day, she has had black tea with a little bit of honey. No sodas or sweet tea.  Current physical activity: walking at work  Current medication access support: none - would likely be eligible for DOH. Patient is willing to reapply for Medicaid.  Hypertension:  Current medications: amlodipine  10 mg daily, carvedilol  12.5 mg BID, losartan  100 mg daily Medications previously tried: lisinopril   Patient reports that she will likely be running out of her BP medications soon. She is willing to pick them up at [facility], as they will likely be more affordable.   Hyperlipidemia/ASCVD Risk Reduction  Current lipid lowering medications: atorvastatin  10 mg daily Medications tried in the past: simvastatin  Antiplatelet regimen: aspirin  81 mg on med list - did not verify if she is taking  ASCVD History: none. Normal  echo (2018) and stress test (2022) Family History: Heart disease and aneurysm (brain) in Mother Risk Factors: T2DM, HTN  Clinical ASCVD: No  The ASCVD Risk score (Arnett DK, et al., 2019)  failed to calculate for the following reasons:   The valid total cholesterol range is 130 to 320 mg/dL    Also needs gabapentin , duloxetine  >    Objective:  BP Readings from Last 3 Encounters:  10/23/23 (!) 140/88  07/27/23 124/75  07/24/23 (!) 142/74   Lab Results  Component Value Date   HGBA1C 10.7 (A) 10/23/2023   HGBA1C 14.4 (A) 07/24/2023   HGBA1C 7.6 (A) 01/27/2023    Lab Results  Component Value Date   CREATININE 1.05 (H) 10/23/2023   BUN 15 10/23/2023   NA 137 10/23/2023   K 3.8 10/23/2023   CL 102 10/23/2023   CO2 19 (L) 10/23/2023    Lab Results  Component Value Date   CHOL 98 (L) 02/25/2023   HDL 48 02/25/2023   LDLCALC 35 02/25/2023   TRIG 71 02/25/2023   CHOLHDL 2.0 02/25/2023    Medications Reviewed Today   Medications were not reviewed in this encounter       Assessment/Plan:   Diabetes: - Currently uncontrolled with last A1C of 10.7% above goal < 7%. Patient is not currently checking her BG, but has recently been restarted on basal insulin  80 units daily. However, she continues to report s/sx of hyperglycemia including nocturia, polyphagia, blurred vision. Emphasized importance of monitoring BG. Patient does have dx of gastroparesis and reduced esophageal motility, but she tolerated Trulicity  up to 4.5 mg weekly in the past without issue. Therefore, will attempt to restart her on Trulicity  with plan for titration given uncontrolled BG.  - Reviewed long term cardiovascular and renal outcomes of uncontrolled blood sugar - Reviewed goal A1c, goal fasting, and goal 2 hour post prandial glucose - Reviewed dietary modifications including reducing portion size of carbohydrate foods, increasing protein, increasing intake of non-starchy vegetables. - Reviewed lifestyle modifications including: increasing physical activity - Recommend to continue glipizide  10 mg BID with meals - Recommend to switch from Tresiba  to Basaglar  for affordability, as she is  eligible for DOH Basaglar  at [facility]. Recommended splitting dose of basal insulin  from 80 units daily to 40 units twice daily for better absorption. Will collaborate with PCP to place orders. - Recommend to start Trulicity  at 0.75 mg weekly as previously prescribed. Will resend prescription to [facility] for [facility].   - Patient denies personal or family history of multiple endocrine neoplasia type 2, medullary thyroid  cancer; personal history of pancreatitis or gallbladder disease. - Recommend to check glucose twice daily - fasting and 2 hr PPG (before bedtime). Sent Rx for True Metrix supplies, which should be more affordable at [facility]. - Meets financial criteria for [facility] at [facility]. Will collaborate with provider, CPhT, and patient to pursue assistance. Educated patient  to apply for Medicaid at [employer] office.    Hypertension: - Currently uncontrolled with last clinic BP above goal < 130/80 mmHg. Patient unable to accurately state which BP medications she is and is not taking. Appears she is due for a refill soon. Will send maintenance medications (that have been recently filled) to pharmacy at [facility] for affordability. Scr and K were stable on last BMP 10/23/23.  - Reviewed long term cardiovascular and renal outcomes of uncontrolled blood pressure - Reviewed appropriate blood pressure monitoring technique and reviewed goal blood pressure. Recommended  to check home blood pressure and heart rate once daily - Recommend to continue amlodipine  10 mg daily, carvedilol  12.5 mg BID, losartan  100 mg daily     Hyperlipidemia/ASCVD Risk Reduction: - Currently controlled with LDL-C of 35 mg/dL below goal < 70 mg/dL on atorvastatin  10 mg daily. Recommend to continue moderate intensity statin.  - Reviewed long term complications of uncontrolled cholesterol - Recommend to continue atorvastatin  10 mg daily.     Follow Up Plan: Pharmacist telephone 12/04/23, PCP  12/18/23   Arthea Larsson, PharmD PGY1 Pharmacy Resident

## 2023-11-09 ENCOUNTER — Other Ambulatory Visit: Payer: Self-pay

## 2023-11-10 ENCOUNTER — Other Ambulatory Visit: Payer: Self-pay

## 2023-11-17 ENCOUNTER — Other Ambulatory Visit: Payer: Self-pay

## 2023-11-17 ENCOUNTER — Other Ambulatory Visit (HOSPITAL_COMMUNITY): Payer: Self-pay | Admitting: Psychiatry

## 2023-11-23 ENCOUNTER — Ambulatory Visit: Payer: Self-pay

## 2023-11-23 ENCOUNTER — Encounter (HOSPITAL_COMMUNITY): Payer: Self-pay

## 2023-11-23 ENCOUNTER — Emergency Department (HOSPITAL_COMMUNITY): Payer: Self-pay

## 2023-11-23 ENCOUNTER — Inpatient Hospital Stay (HOSPITAL_COMMUNITY)
Admission: EM | Admit: 2023-11-23 | Discharge: 2023-11-27 | DRG: 639 | Disposition: A | Discharge status: Home / Self Care | Payer: Self-pay | Attending: Internal Medicine | Admitting: Internal Medicine

## 2023-11-23 ENCOUNTER — Other Ambulatory Visit: Payer: Self-pay

## 2023-11-23 DIAGNOSIS — Z7985 Long-term (current) use of injectable non-insulin antidiabetic drugs: Secondary | ICD-10-CM

## 2023-11-23 DIAGNOSIS — K219 Gastro-esophageal reflux disease without esophagitis: Secondary | ICD-10-CM | POA: Diagnosis present

## 2023-11-23 DIAGNOSIS — J4540 Moderate persistent asthma, uncomplicated: Secondary | ICD-10-CM | POA: Diagnosis present

## 2023-11-23 DIAGNOSIS — T383X1A Poisoning by insulin and oral hypoglycemic [antidiabetic] drugs, accidental (unintentional), initial encounter: Secondary | ICD-10-CM | POA: Diagnosis present

## 2023-11-23 DIAGNOSIS — I959 Hypotension, unspecified: Secondary | ICD-10-CM | POA: Diagnosis present

## 2023-11-23 DIAGNOSIS — Z79890 Hormone replacement therapy (postmenopausal): Secondary | ICD-10-CM

## 2023-11-23 DIAGNOSIS — Z7982 Long term (current) use of aspirin: Secondary | ICD-10-CM

## 2023-11-23 DIAGNOSIS — Z841 Family history of disorders of kidney and ureter: Secondary | ICD-10-CM

## 2023-11-23 DIAGNOSIS — Z9049 Acquired absence of other specified parts of digestive tract: Secondary | ICD-10-CM

## 2023-11-23 DIAGNOSIS — Z860100 Personal history of colon polyps, unspecified: Secondary | ICD-10-CM

## 2023-11-23 DIAGNOSIS — T445X5A Adverse effect of predominantly beta-adrenoreceptor agonists, initial encounter: Secondary | ICD-10-CM | POA: Diagnosis present

## 2023-11-23 DIAGNOSIS — E11649 Type 2 diabetes mellitus with hypoglycemia without coma: Principal | ICD-10-CM | POA: Diagnosis present

## 2023-11-23 DIAGNOSIS — E669 Obesity, unspecified: Secondary | ICD-10-CM | POA: Diagnosis present

## 2023-11-23 DIAGNOSIS — N179 Acute kidney failure, unspecified: Secondary | ICD-10-CM | POA: Diagnosis not present

## 2023-11-23 DIAGNOSIS — E785 Hyperlipidemia, unspecified: Secondary | ICD-10-CM | POA: Diagnosis present

## 2023-11-23 DIAGNOSIS — Z888 Allergy status to other drugs, medicaments and biological substances status: Secondary | ICD-10-CM

## 2023-11-23 DIAGNOSIS — Z6830 Body mass index (BMI) 30.0-30.9, adult: Secondary | ICD-10-CM

## 2023-11-23 DIAGNOSIS — F259 Schizoaffective disorder, unspecified: Secondary | ICD-10-CM | POA: Diagnosis present

## 2023-11-23 DIAGNOSIS — Z7984 Long term (current) use of oral hypoglycemic drugs: Secondary | ICD-10-CM

## 2023-11-23 DIAGNOSIS — Z794 Long term (current) use of insulin: Secondary | ICD-10-CM

## 2023-11-23 DIAGNOSIS — E1140 Type 2 diabetes mellitus with diabetic neuropathy, unspecified: Secondary | ICD-10-CM | POA: Diagnosis present

## 2023-11-23 DIAGNOSIS — D649 Anemia, unspecified: Secondary | ICD-10-CM | POA: Diagnosis present

## 2023-11-23 DIAGNOSIS — Z8249 Family history of ischemic heart disease and other diseases of the circulatory system: Secondary | ICD-10-CM

## 2023-11-23 DIAGNOSIS — Z79899 Other long term (current) drug therapy: Secondary | ICD-10-CM

## 2023-11-23 DIAGNOSIS — F251 Schizoaffective disorder, depressive type: Secondary | ICD-10-CM | POA: Diagnosis present

## 2023-11-23 DIAGNOSIS — E162 Hypoglycemia, unspecified: Secondary | ICD-10-CM | POA: Diagnosis not present

## 2023-11-23 DIAGNOSIS — Z833 Family history of diabetes mellitus: Secondary | ICD-10-CM

## 2023-11-23 DIAGNOSIS — I10 Essential (primary) hypertension: Secondary | ICD-10-CM | POA: Diagnosis present

## 2023-11-23 DIAGNOSIS — E1165 Type 2 diabetes mellitus with hyperglycemia: Secondary | ICD-10-CM

## 2023-11-23 DIAGNOSIS — Z823 Family history of stroke: Secondary | ICD-10-CM

## 2023-11-23 DIAGNOSIS — K589 Irritable bowel syndrome without diarrhea: Secondary | ICD-10-CM | POA: Diagnosis present

## 2023-11-23 DIAGNOSIS — E039 Hypothyroidism, unspecified: Secondary | ICD-10-CM | POA: Diagnosis present

## 2023-11-23 LAB — URINALYSIS, ROUTINE W REFLEX MICROSCOPIC
Bilirubin Urine: NEGATIVE
Glucose, UA: NEGATIVE mg/dL
Hgb urine dipstick: NEGATIVE
Ketones, ur: NEGATIVE mg/dL
Leukocytes,Ua: NEGATIVE
Nitrite: NEGATIVE
Protein, ur: NEGATIVE mg/dL
Specific Gravity, Urine: 1.002 — ABNORMAL LOW (ref 1.005–1.030)
pH: 7 (ref 5.0–8.0)

## 2023-11-23 LAB — COMPREHENSIVE METABOLIC PANEL WITH GFR
ALT: 24 U/L (ref 0–44)
AST: 19 U/L (ref 15–41)
Albumin: 3.8 g/dL (ref 3.5–5.0)
Alkaline Phosphatase: 60 U/L (ref 38–126)
Anion gap: 8 (ref 5–15)
BUN: 14 mg/dL (ref 6–20)
CO2: 24 mmol/L (ref 22–32)
Calcium: 8.8 mg/dL — ABNORMAL LOW (ref 8.9–10.3)
Chloride: 105 mmol/L (ref 98–111)
Creatinine, Ser: 1.02 mg/dL — ABNORMAL HIGH (ref 0.44–1.00)
GFR, Estimated: >60 mL/min (ref >60)
Glucose, Bld: 115 mg/dL — ABNORMAL HIGH (ref 70–99)
Potassium: 3.9 mmol/L (ref 3.5–5.1)
Sodium: 137 mmol/L (ref 135–145)
Total Bilirubin: 0.9 mg/dL (ref 0.0–1.2)
Total Protein: 7.7 g/dL (ref 6.5–8.1)

## 2023-11-23 LAB — CBC WITH DIFFERENTIAL/PLATELET
Abs Immature Granulocytes: 0.01 10*3/uL (ref 0.00–0.07)
Basophils Absolute: 0 10*3/uL (ref 0.0–0.1)
Basophils Relative: 1 %
Eosinophils Absolute: 0.2 10*3/uL (ref 0.0–0.5)
Eosinophils Relative: 3 %
HCT: 34.7 % — ABNORMAL LOW (ref 36.0–46.0)
Hemoglobin: 11.2 g/dL — ABNORMAL LOW (ref 12.0–15.0)
Immature Granulocytes: 0 %
Lymphocytes Relative: 46 %
Lymphs Abs: 3.2 10*3/uL (ref 0.7–4.0)
MCH: 28.9 pg (ref 26.0–34.0)
MCHC: 32.3 g/dL (ref 30.0–36.0)
MCV: 89.7 fL (ref 80.0–100.0)
Monocytes Absolute: 0.5 10*3/uL (ref 0.1–1.0)
Monocytes Relative: 7 %
Neutro Abs: 2.9 10*3/uL (ref 1.7–7.7)
Neutrophils Relative %: 43 %
Platelets: 297 10*3/uL (ref 150–400)
RBC: 3.87 MIL/uL (ref 3.87–5.11)
RDW: 12.8 % (ref 11.5–15.5)
WBC: 6.7 10*3/uL (ref 4.0–10.5)
nRBC: 0 % (ref 0.0–0.2)

## 2023-11-23 LAB — CBG MONITORING, ED
Glucose-Capillary: 52 mg/dL — ABNORMAL LOW (ref 70–99)
Glucose-Capillary: 74 mg/dL (ref 70–99)
Glucose-Capillary: 48 mg/dL — ABNORMAL LOW (ref 70–99)
Glucose-Capillary: 58 mg/dL — ABNORMAL LOW (ref 70–99)
Glucose-Capillary: 99 mg/dL (ref 70–99)

## 2023-11-23 LAB — TROPONIN I (HIGH SENSITIVITY)
Troponin I (High Sensitivity): <2 ng/L (ref <18)
Troponin I (High Sensitivity): <2 ng/L (ref <18)

## 2023-11-23 MED ORDER — CLONAZEPAM 0.5 MG PO TABS: 0.5000 mg | ORAL_TABLET | Freq: Two times a day (BID) | ORAL | Status: DC | PRN

## 2023-11-23 MED ORDER — AMLODIPINE BESYLATE 10 MG PO TABS
10.0000 mg | ORAL_TABLET | Freq: Every day | ORAL | Status: DC
  Administered 2023-11-24 – 2023-11-25 (×2): 10 mg via ORAL
  Filled 2023-11-23: qty 2
  Filled 2023-11-23: qty 1

## 2023-11-23 MED ORDER — LOSARTAN POTASSIUM 50 MG PO TABS
100.0000 mg | ORAL_TABLET | Freq: Every day | ORAL | Status: DC
  Administered 2023-11-24 – 2023-11-25 (×2): 100 mg via ORAL
  Filled 2023-11-23 (×3): qty 2

## 2023-11-23 MED ORDER — RISPERIDONE 2 MG PO TABS
2.0000 mg | ORAL_TABLET | Freq: Every day | ORAL | Status: DC
  Administered 2023-11-25 – 2023-11-27 (×3): 2 mg via ORAL
  Filled 2023-11-23 (×6): qty 1

## 2023-11-23 MED ORDER — LEVOTHYROXINE SODIUM 75 MCG PO TABS
75.0000 ug | ORAL_TABLET | Freq: Every day | ORAL | Status: DC
  Administered 2023-11-24 – 2023-11-27 (×4): 75 ug via ORAL
  Filled 2023-11-23 (×5): qty 1

## 2023-11-23 MED ORDER — ENOXAPARIN SODIUM 40 MG/0.4ML IJ SOSY
40.0000 mg | PREFILLED_SYRINGE | INTRAMUSCULAR | Status: DC
  Administered 2023-11-24 – 2023-11-27 (×4): 40 mg via SUBCUTANEOUS
  Filled 2023-11-23 (×4): qty 0.4

## 2023-11-23 MED ORDER — CARVEDILOL 12.5 MG PO TABS
12.5000 mg | ORAL_TABLET | Freq: Two times a day (BID) | ORAL | Status: DC
  Administered 2023-11-23 – 2023-11-25 (×4): 12.5 mg via ORAL
  Filled 2023-11-23 (×4): qty 1

## 2023-11-23 MED ORDER — DULOXETINE HCL 60 MG PO CPEP
60.0000 mg | ORAL_CAPSULE | Freq: Two times a day (BID) | ORAL | Status: DC
  Administered 2023-11-23 – 2023-11-27 (×8): 60 mg via ORAL
  Filled 2023-11-23 (×8): qty 1

## 2023-11-23 MED ORDER — ATORVASTATIN CALCIUM 10 MG PO TABS
10.0000 mg | ORAL_TABLET | Freq: Every day | ORAL | Status: DC
  Administered 2023-11-24 – 2023-11-27 (×4): 10 mg via ORAL
  Filled 2023-11-23 (×4): qty 1

## 2023-11-23 MED ORDER — ACETAMINOPHEN 325 MG PO TABS
650.0000 mg | ORAL_TABLET | Freq: Four times a day (QID) | ORAL | Status: DC | PRN
  Administered 2023-11-25: 650 mg via ORAL
  Filled 2023-11-23: qty 2

## 2023-11-23 MED ORDER — RISPERIDONE 3 MG PO TABS
6.0000 mg | ORAL_TABLET | Freq: Every day | ORAL | Status: DC
  Administered 2023-11-23 – 2023-11-26 (×4): 6 mg via ORAL
  Filled 2023-11-23 (×6): qty 2

## 2023-11-23 MED ORDER — GLUCOSE 40 % PO GEL: 1.0000 | Freq: Once | ORAL | Status: DC

## 2023-11-23 MED ORDER — TRAZODONE HCL 50 MG PO TABS
200.0000 mg | ORAL_TABLET | Freq: Every day | ORAL | Status: DC
  Administered 2023-11-24 – 2023-11-26 (×3): 200 mg via ORAL
  Filled 2023-11-23 (×4): qty 4

## 2023-11-23 MED ORDER — GABAPENTIN 300 MG PO CAPS
300.0000 mg | ORAL_CAPSULE | Freq: Every day | ORAL | Status: DC
  Administered 2023-11-24 – 2023-11-27 (×4): 300 mg via ORAL
  Filled 2023-11-23 (×4): qty 1

## 2023-11-23 MED ORDER — DEXTROSE 10 % IV SOLN: INTRAVENOUS | Status: DC

## 2023-11-23 MED ORDER — BENZTROPINE MESYLATE 1 MG PO TABS
1.0000 mg | ORAL_TABLET | Freq: Every day | ORAL | Status: DC
  Administered 2023-11-23 – 2023-11-26 (×4): 1 mg via ORAL
  Filled 2023-11-23 (×6): qty 1

## 2023-11-23 MED ORDER — ACETAMINOPHEN 650 MG RE SUPP
650.0000 mg | Freq: Four times a day (QID) | RECTAL | Status: DC | PRN
Start: 2023-11-23 — End: 2023-11-27

## 2023-11-23 NOTE — ED Notes (Signed)
 Pt was provided with orange juice, a coca cola and a [place] sandwich.

## 2023-11-23 NOTE — ED Triage Notes (Signed)
 Pt currently alert and oriented x4 pt is eating.

## 2023-11-23 NOTE — Telephone Encounter (Signed)
 Patient calling back in and states she feels her symptoms have worsened. She states the dizzy spells just started, however that was documented during previous RN's assessment. She states the shakiness and urinating more frequently has worsened. Patient states her blood sugar dropped down to 63. She states she drank more orange juice at 11am. Advised patient to go to ED for recurrent low blood sugar more than 30 minutes after home treatment. Patient is agreeable to go to ED, she states she will call her husband and have him take her right now.

## 2023-11-23 NOTE — ED Notes (Signed)
 Pt given apple juice and peanut butter/graham cracker. PA notified of CBG. Pt a&ox4 at this time.

## 2023-11-23 NOTE — ED Triage Notes (Signed)
 Pt reports nausea vomiting, frequent urination.

## 2023-11-23 NOTE — ED Notes (Signed)
 PT brought Zaxbys by her spouse. This RN checked her CBG and it was 99

## 2023-11-23 NOTE — ED Provider Notes (Signed)
 [place] EMERGENCY DEPARTMENT AT [facility] Provider Note   CSN: [id number] Arrival date & time: 11/23/23  1226     Patient presents with: Hypoglycemia (Pt took insulin  this am and states her blood sugar this am was 49. States it has been dropping since yesterday. Pt is alert and oriented. Ambulatory. )   Kathryn [name] is a 53 y.o. female With past medical history of anemia, asthma, GERD, HLD, HTN, hypothyroidism, neuropathy, schizoaffective disorder, T2DM presents to emergency department for evaluation of clamminess, nausea, hypoglycemia that she noticed at night yesterday.  She reports that when she went to bed her sugar was 50 following taking her insulin  in evening.  When she woke up this morning she was clammy and sugar was noted to be 49 but still took her 40 units of insulin . She called her PCP who recommended ED evaluation as her sugars did not improve following drinking OJ. She recently restarted taking Tresiba  40 units twice daily since being seen by PCP on 10/23/2023 and continues to take Trulicity  and glipizide  for T2DM. She reports that prior to restarting Tresiba , her sugars were in 300s.    Hypoglycemia Associated symptoms: no dizziness, no seizures, no shortness of breath, no vomiting and no weakness        Prior to Admission medications   Medication Sig Start Date End Date Taking? Authorizing Provider  albuterol  (VENTOLIN  HFA) 108 (90 Base) MCG/ACT inhaler Inhale 2 puffs into the lungs every 6 (six) hours as needed for wheezing or shortness of breath. 09/16/22   Zarwolo, Gloria, FNP  amLODipine  (NORVASC ) 10 MG tablet Take 1 tablet (10 mg total) by mouth daily. 11/06/23   Paseda, Folashade R, FNP  aspirin  EC 81 MG tablet Take 81 mg by mouth daily.    [provider]  atorvastatin  (LIPITOR) 10 MG tablet Take 1 tablet (10 mg total) by mouth daily. 11/06/23   Paseda, Folashade R, FNP  benztropine  (COGENTIN ) 1 MG tablet TAKE 1 TABLET(1 MG) BY MOUTH AT  BEDTIME 10/23/23   Paseda, Folashade R, FNP  Blood Glucose Monitoring Suppl (TRUE METRIX METER) w/Device KIT Use as directed to monitor blood sugar. 11/06/23   Paseda, Folashade R, FNP  carvedilol  (COREG ) 12.5 MG tablet Take 1 tablet (12.5 mg total) by mouth 2 (two) times daily. 11/06/23   Paseda, Folashade R, FNP  Cholecalciferol (VITAMIN D -3) 125 MCG (5000 UT) TABS Take 2 tablets by mouth daily. 10/13/19   [name], Gebreselassie W, MD  clonazePAM  (KLONOPIN ) 0.5 MG tablet Take 1 tablet (0.5 mg total) by mouth 2 (two) times daily as needed for anxiety. 03/10/23   Okey Barnie SAUNDERS, MD  Dulaglutide  (TRULICITY ) 0.75 MG/0.5ML SOAJ Inject 0.75 mg into the skin once a week. 11/06/23   Paseda, Folashade R, FNP  Dulaglutide  (TRULICITY ) 1.5 MG/0.5ML SOAJ Inject 1.5 mg into the skin once a week. 11/20/23   Paseda, Folashade R, FNP  DULoxetine  (CYMBALTA ) 60 MG capsule Take 1 capsule (60 mg total) by mouth 2 (two) times daily. 11/06/23   Paseda, Folashade R, FNP  gabapentin  (NEURONTIN ) 300 MG capsule Take 1 capsule (300 mg total) by mouth daily. 10/23/23 04/20/24  Paseda, Folashade R, FNP  glipiZIDE  (GLUCOTROL ) 10 MG tablet Take 1 tablet (10 mg total) by mouth 2 (two) times daily before a meal. 10/23/23   Paseda, Folashade R, FNP  glucose blood (TRUE METRIX BLOOD GLUCOSE TEST) test strip Use as instructed to monitor blood sugar twice daily 11/06/23   Paseda, Folashade R, FNP  Insulin  Glargine (BASAGLAR  KWIKPEN) 100 UNIT/ML Inject 40 Units into the skin 2 (two) times daily. May increase up to 50 units twice daily if instructed by your provider. 11/06/23   Paseda, Folashade R, FNP  levothyroxine  (SYNTHROID ) 75 MCG tablet Take 1 tablet (75 mcg total) by mouth daily before breakfast. 10/26/23   Paseda, Folashade R, FNP  losartan  (COZAAR ) 100 MG tablet Take 1 tablet (100 mg total) by mouth daily. 07/24/23   Paseda, Folashade R, FNP  Multiple Vitamin (MULTIVITAMIN) tablet Take 1 tablet by mouth daily.    [provider]   risperiDONE  (RISPERDAL ) 2 MG tablet TAKE 1 TABLET EVERY MORNING AND 3 TABLETS AT BEDTIME 03/10/23   Okey Barnie SAUNDERS, MD  rizatriptan  (MAXALT ) 10 MG tablet Take 1 tablet (10 mg total) by mouth as needed for migraine. May repeat in 2 hours if needed Patient not taking: Reported on 11/06/2023 10/23/23   Paseda, Folashade R, FNP  topiramate  (TOPAMAX ) 100 MG tablet TAKE 1 TABLET BY MOUTH EVERYDAY AT BEDTIME 08/10/23   Paseda, Folashade R, FNP  traZODone  (DESYREL ) 100 MG tablet TAKE 2 TABLETS(200 MG) BY MOUTH AT BEDTIME 08/10/23   Okey Barnie SAUNDERS, MD  TRUEplus Lancets 30G MISC Use as directed to monitor blood sugar twice daily 11/06/23   Paseda, Folashade R, FNP    Allergies: [place] bread torrence root (wolfiporia cocos)]    Review of Systems  Constitutional:  Negative for chills, fatigue and fever.  Respiratory:  Negative for cough, chest tightness, shortness of breath and wheezing.   Cardiovascular:  Negative for chest pain and palpitations.  Gastrointestinal:  Negative for abdominal pain, constipation, diarrhea, nausea and vomiting.  Neurological:  Negative for dizziness, seizures, weakness, light-headedness, numbness and headaches.    Updated Vital Signs BP (!) 146/91   Pulse 87   Temp 97.7 F (36.5 C) (Oral)   Resp 16   Ht 5' (1.524 m)   Wt 70.3 kg   LMP 01/23/2013   SpO2 100%   BMI 30.27 kg/m   Physical Exam Vitals and nursing note reviewed.  Constitutional:      General: She is not in acute distress.    Appearance: Normal appearance.  HENT:     Head: Normocephalic and atraumatic.   Eyes:     Conjunctiva/sclera: Conjunctivae normal.    Cardiovascular:     Rate and Rhythm: Normal rate.  Pulmonary:     Effort: Pulmonary effort is normal. No respiratory distress.   Skin:    Coloration: Skin is not jaundiced or pale.   Neurological:     Mental Status: She is alert. Mental status is at baseline.     (all labs ordered are listed, but only abnormal results are  displayed) Labs Reviewed  CBC WITH DIFFERENTIAL/PLATELET - Abnormal; Notable for the following components:      Result Value   Hemoglobin 11.2 (*)    HCT 34.7 (*)    All other components within normal limits  COMPREHENSIVE METABOLIC PANEL WITH GFR - Abnormal; Notable for the following components:   Glucose, Bld 115 (*)    Creatinine, Ser 1.02 (*)    Calcium  8.8 (*)    All other components within normal limits  URINALYSIS, ROUTINE W REFLEX MICROSCOPIC - Abnormal; Notable for the following components:   Color, Urine COLORLESS (*)    Specific Gravity, Urine 1.002 (*)    All other components within normal limits  CBG MONITORING, ED - Abnormal; Notable for the following components:   Glucose-Capillary 52 (*)  All other components within normal limits  CBG MONITORING, ED - Abnormal; Notable for the following components:   Glucose-Capillary 48 (*)    All other components within normal limits  CBG MONITORING, ED - Abnormal; Notable for the following components:   Glucose-Capillary 58 (*)    All other components within normal limits  CBG MONITORING, ED  CBG MONITORING, ED  TROPONIN I (HIGH SENSITIVITY)  TROPONIN I (HIGH SENSITIVITY)    EKG: None  Radiology: DG Chest 2 View Result Date: 11/23/2023 CLINICAL DATA:  Midline chest pain. EXAM: CHEST - 2 VIEW COMPARISON:  February 16, 2017 FINDINGS: The heart size and mediastinal contours are within normal limits. Both lungs are clear. Radiopaque surgical clips are seen within the right upper quadrant. The visualized skeletal structures are unremarkable. IMPRESSION: No active cardiopulmonary disease. Electronically Signed   By: Suzen Dials M.D.   On: 11/23/2023 17:42     Medications Ordered in the ED  dextrose  (GLUTOSE) oral gel 40% (peds > 20kg and adults) (31 g Oral Not Given 11/23/23 1952)  dextrose  10 % infusion ( Intravenous New Bag/Given 11/23/23 1855)    Clinical Course as of 11/23/23 2049  Mon Nov 23, 2023  1745  Glucose-Capillary(!): 48 Is fully alert and oriented. Provided apple juice, graham crackers, and peanut butter by nursing. Will continue with routine CBG checks [LB]    Clinical Course User Index [LB] Minnie Tinnie BRAVO, PA                                 Medical Decision Making Amount and/or Complexity of Data Reviewed Labs:  Decision-making details documented in ED Course. Radiology: ordered.  Risk OTC drugs. Prescription drug management. Decision regarding hospitalization.   Patient presents to the ED for concern of hypoglycemia, this involves an extensive number of treatment options, and is a complaint that carries with it a high risk of complications and morbidity.  The differential diagnosis includes insulin  overuse, sepsis, MI, malignancy   Co morbidities that complicate the patient evaluation  T2DM Recently starting insulin  40 mg twice daily   Additional history obtained:  Additional history obtained from [facility]   External records from outside source obtained and reviewed including triage RN note   Lab Tests:  I Ordered, and personally interpreted labs.  The pertinent results include:   Troponin negative x 2 CBG 115 following D10 drip Hgb 11.2   Imaging Studies ordered:  I ordered imaging studies including chest x-ray I independently visualized and interpreted imaging which showed no acute intrathoracic pathology I agree with the radiologist interpretation   Cardiac Monitoring:  The patient was maintained on a cardiac monitor.  I personally viewed and interpreted the cardiac monitored which showed an underlying rhythm of: NSR 86 bpm with no T wave nor ST abnormalities   Medicines ordered and prescription drug management:  I ordered medication including D10  for hypoglycemia  Reevaluation of the patient after these medicines showed that the patient improved I have reviewed the patients home medicines and have made adjustments as  needed     Consultations Obtained:  I requested consultation with hospitalist Dr. Franky,  and discussed lab and imaging findings as well as pertinent plan - accepts patient for admission   Problem List / ED Course:  Hypoglycemia Has been taking 40 units of insulin  twice a day even when her sugars are reading 40 in the morning as directed by  my primary care provider. While in ED for over 7 hours, unable to control sugars with p.o. only.  Initially started at 52 improving to 74 then dropping to 48 then 58.  Fortunately no AMS, LOC, seizures. Started a D10 drip Will admit patient for overnight observation for serial cbg CP 4:46 PM While walking by bed, patient is talking on cell phone. She flagged down EDP to inform me that she just started feeling CP, SHOB. Describes CP as tightness. Worsens with deep inspiration and palptaion. Feels shob at rest. No signs of resp distress. Speaking in full and complete sentences wo difficulty Fortunately, troponin negative x 2.  Chest x-ray without acute abnormality resolved   Reevaluation:  After the interventions noted above, I reevaluated the patient and found that they have :improved     Dispostion:  After consideration of the diagnostic results and the patients response to treatment, I feel that the patent would benefit from admission for serial CBG monitoring.  Discussed ED workup, disposition, return to ED precautions with patient who expresses understanding agrees with plan.  All questions answered to their satisfaction.  They are agreeable to plan.  Discharge instructions provided on paperwork  Final diagnoses:  Hypoglycemia    ED Discharge Orders     None          Minnie Tinnie BRAVO, PA 11/23/23 2051    Pamella Ozell LABOR, DO 11/28/23 [phone]

## 2023-11-23 NOTE — ED Notes (Signed)
 Aware of blood sugar and patient going back to triage for assessment.

## 2023-11-23 NOTE — Telephone Encounter (Signed)
 FYI Only or Action Required?: Action required by provider: clinical question for provider.  Patient was last seen in primary care on 10/23/2023 by Paseda, Folashade R, FNP. Called Nurse Triage reporting Hypoglycemia. Symptoms began several days ago. Interventions attempted: Nothing. Symptoms are: gradually worsening.  Triage Disposition: Call PCP When Office is Open  Patient/caregiver understands and will follow disposition?: Yes, will follow disposition  Copied from CRM [id number]. Topic: Clinical - Red Word Triage >> Nov 23, 2023  9:07 AM Selinda RAMAN wrote: Red Word that prompted transfer to Nurse Triage: The patient called in stating over the last couple of days she says she has woke up, eaten, taken her meds and her sugar has been bottoming out around 50 and this morning it was 49. She says she takes this after taking her Insulin  Glargine (BASAGLAR  KWIKPEN) 100 UNIT/ML and Dulaglutide  (TRULICITY ) 0.75 MG/0.5ML SOAJ. I will transfer her to E2C2 NT Reason for Disposition  [1] Morning (before breakfast) blood glucose < 80 mg/dL (4.4 mmol/L) AND [7] more than once in past week  Answer Assessment - Initial Assessment Questions 1. SYMPTOMS: What symptoms are you concerned about?     Dizzy spells, urinating more, diaphoresis 2. ONSET:  When did the symptoms start?     Last couple of days 3. BLOOD GLUCOSE: What is your blood glucose level?      64 4. USUAL RANGE: What is your blood glucose level usually? (e.g., usual fasting morning value, usual evening value)     130-140 5. TYPE 1 or 2:  Do you know what type of diabetes you have?  (e.g., Type 1, Type 2, Gestational; doesn't know)      Type 2 6. INSULIN : Do you take insulin ? What type of insulin (s) do you use? What is the mode of delivery? (syringe, pen; injection or pump) When did you last give yourself an insulin  dose? (i.e., time or hours/minutes ago) How much did you give? (i.e., how many units)     Glargine (BASAGLAR  KWIKPEN) 100  UNIT/ML and Dulaglutide  (TRULICITY ) 0.75 MG/0.5ML SOAJ.  Takes 40 units of trulicity  7. DIABETES PILLS: Do you take any pills for your diabetes? If Yes, ask: What is the name of the medicine(s) that you take for high blood sugar?     denies 8. OTHER SYMPTOMS: Do you have any symptoms? (e.g., fever, frequent urination, difficulty breathing, vomiting)     Frequent urination 9. LOW BLOOD GLUCOSE TREATMENT: What have you done so far to treat the low blood glucose level?     [place] sandwich, now drinking OJ 10. FOOD: When did you last eat or drink?       0830, malawi sandwich 11. ALONE: Are you alone right now or is someone with you?        At work  Pt states that her PCP wants her to take her insulin  when her sugars are in the 40s in the morning. Pt states this is happening in the evening as well. Pt states that she has recently restarted her insulin . Routing HP to clinic, needs appt and further instructions.  Protocols used: Diabetes - Low Blood Sugar-A-AH

## 2023-11-23 NOTE — ED Provider Triage Note (Signed)
 Emergency Medicine Provider Triage Evaluation Note  Kathryn [name] , a 53 y.o. female  was evaluated in triage.  Pt complains of not feeling well.  Patient states she got up this morning and her blood sugar was 49.  She did have a [place] sandwich and she did do her injectable insulin .  Patient arrived here with blood sugar low.  She feels a little bit better now that is up to 72 but still feeling kind of nauseated and not feeling exactly right.  Patient had been off the insulin  pen for over a year recently got started back on it..  In addition patient stated that she woke up in the puddle of sweat this morning.  Review of Systems  Positive: Nausea Negative: Vomiting, chest pain, abdominal pain, shortness of breath  Physical Exam  BP (!) 137/93   Pulse 92   Temp 97.9 F (36.6 C)   Resp 18   Ht 1.524 m (5')   Wt 70.3 kg   LMP 01/23/2013   SpO2 100%   BMI 30.27 kg/m  Gen: Awake, no distress   Resp: Normal effort  MSK: Moves extremities without difficulty.  Neuro: No acute deficits. Abdomen: Soft nontender Other:    Medical Decision Making  Medically screening exam initiated at 1:23 PM.  Appropriate orders placed.  Kathryn [name] was informed that the remainder of the evaluation will be completed by another provider, this initial triage assessment does not replace that evaluation, and the importance of remaining in the ED until their evaluation is complete.  Patient will need careful monitoring of her sugar.  Since she is getting insulin  on board.  Patient should not have given the insulin  this morning when her sugar was already in the 40s.  Will check labs.  Will into include urinalysis   Iowa Kappes, MD 11/23/23 1343

## 2023-11-23 NOTE — ED Triage Notes (Signed)
 Pt reports that  her blood sugar has been low starting yesterday. Pt reports that she had been off of her insulin  and they just restarted it.  Pt took insulin  at 0520 pt reports taking trulisity.

## 2023-11-23 NOTE — ED Notes (Signed)
Pt ambulated to restroom with no issues.  

## 2023-11-23 NOTE — ED Notes (Signed)
 PT refuses to be hooked up to the monitor at this time due to her having to take multiple trips to the bathroom

## 2023-11-24 ENCOUNTER — Encounter (HOSPITAL_COMMUNITY): Payer: Self-pay | Admitting: Internal Medicine

## 2023-11-24 ENCOUNTER — Observation Stay (HOSPITAL_COMMUNITY)

## 2023-11-24 DIAGNOSIS — E039 Hypothyroidism, unspecified: Secondary | ICD-10-CM

## 2023-11-24 DIAGNOSIS — Z794 Long term (current) use of insulin: Secondary | ICD-10-CM

## 2023-11-24 DIAGNOSIS — Z79899 Other long term (current) drug therapy: Secondary | ICD-10-CM | POA: Diagnosis not present

## 2023-11-24 DIAGNOSIS — I10 Essential (primary) hypertension: Secondary | ICD-10-CM

## 2023-11-24 DIAGNOSIS — T383X1A Poisoning by insulin and oral hypoglycemic [antidiabetic] drugs, accidental (unintentional), initial encounter: Secondary | ICD-10-CM | POA: Diagnosis present

## 2023-11-24 DIAGNOSIS — Z7985 Long-term (current) use of injectable non-insulin antidiabetic drugs: Secondary | ICD-10-CM | POA: Diagnosis not present

## 2023-11-24 DIAGNOSIS — K219 Gastro-esophageal reflux disease without esophagitis: Secondary | ICD-10-CM | POA: Diagnosis present

## 2023-11-24 DIAGNOSIS — E11649 Type 2 diabetes mellitus with hypoglycemia without coma: Secondary | ICD-10-CM | POA: Diagnosis present

## 2023-11-24 DIAGNOSIS — Z8249 Family history of ischemic heart disease and other diseases of the circulatory system: Secondary | ICD-10-CM | POA: Diagnosis not present

## 2023-11-24 DIAGNOSIS — J4540 Moderate persistent asthma, uncomplicated: Secondary | ICD-10-CM | POA: Diagnosis present

## 2023-11-24 DIAGNOSIS — E1140 Type 2 diabetes mellitus with diabetic neuropathy, unspecified: Secondary | ICD-10-CM | POA: Diagnosis present

## 2023-11-24 DIAGNOSIS — E1165 Type 2 diabetes mellitus with hyperglycemia: Secondary | ICD-10-CM

## 2023-11-24 DIAGNOSIS — I959 Hypotension, unspecified: Secondary | ICD-10-CM | POA: Diagnosis present

## 2023-11-24 DIAGNOSIS — Z7984 Long term (current) use of oral hypoglycemic drugs: Secondary | ICD-10-CM | POA: Diagnosis not present

## 2023-11-24 DIAGNOSIS — Z823 Family history of stroke: Secondary | ICD-10-CM | POA: Diagnosis not present

## 2023-11-24 DIAGNOSIS — E785 Hyperlipidemia, unspecified: Secondary | ICD-10-CM

## 2023-11-24 DIAGNOSIS — E162 Hypoglycemia, unspecified: Secondary | ICD-10-CM

## 2023-11-24 DIAGNOSIS — N179 Acute kidney failure, unspecified: Secondary | ICD-10-CM | POA: Diagnosis not present

## 2023-11-24 DIAGNOSIS — E669 Obesity, unspecified: Secondary | ICD-10-CM | POA: Diagnosis present

## 2023-11-24 DIAGNOSIS — Z6830 Body mass index (BMI) 30.0-30.9, adult: Secondary | ICD-10-CM | POA: Diagnosis not present

## 2023-11-24 DIAGNOSIS — T445X5A Adverse effect of predominantly beta-adrenoreceptor agonists, initial encounter: Secondary | ICD-10-CM | POA: Diagnosis present

## 2023-11-24 DIAGNOSIS — F259 Schizoaffective disorder, unspecified: Secondary | ICD-10-CM | POA: Diagnosis present

## 2023-11-24 DIAGNOSIS — Z79890 Hormone replacement therapy (postmenopausal): Secondary | ICD-10-CM | POA: Diagnosis not present

## 2023-11-24 DIAGNOSIS — Z833 Family history of diabetes mellitus: Secondary | ICD-10-CM | POA: Diagnosis not present

## 2023-11-24 DIAGNOSIS — Z7982 Long term (current) use of aspirin: Secondary | ICD-10-CM | POA: Diagnosis not present

## 2023-11-24 DIAGNOSIS — D649 Anemia, unspecified: Secondary | ICD-10-CM | POA: Diagnosis present

## 2023-11-24 LAB — CBG MONITORING, ED
Glucose-Capillary: 125 mg/dL — ABNORMAL HIGH (ref 70–99)
Glucose-Capillary: 177 mg/dL — ABNORMAL HIGH (ref 70–99)
Glucose-Capillary: 137 mg/dL — ABNORMAL HIGH (ref 70–99)
Glucose-Capillary: 144 mg/dL — ABNORMAL HIGH (ref 70–99)

## 2023-11-24 LAB — CBC
HCT: 32.8 % — ABNORMAL LOW (ref 36.0–46.0)
HCT: 33.5 % — ABNORMAL LOW (ref 36.0–46.0)
Hemoglobin: 10.8 g/dL — ABNORMAL LOW (ref 12.0–15.0)
Hemoglobin: 10.9 g/dL — ABNORMAL LOW (ref 12.0–15.0)
MCH: 29 pg (ref 26.0–34.0)
MCH: 28.5 pg (ref 26.0–34.0)
MCHC: 32.9 g/dL (ref 30.0–36.0)
MCHC: 32.5 g/dL (ref 30.0–36.0)
MCV: 87.9 fL (ref 80.0–100.0)
MCV: 87.5 fL (ref 80.0–100.0)
Platelets: 285 10*3/uL (ref 150–400)
Platelets: 279 10*3/uL (ref 150–400)
RBC: 3.73 MIL/uL — ABNORMAL LOW (ref 3.87–5.11)
RBC: 3.83 MIL/uL — ABNORMAL LOW (ref 3.87–5.11)
RDW: 12.5 % (ref 11.5–15.5)
RDW: 12.6 % (ref 11.5–15.5)
WBC: 6.7 10*3/uL (ref 4.0–10.5)
WBC: 5.7 10*3/uL (ref 4.0–10.5)
nRBC: 0 % (ref 0.0–0.2)
nRBC: 0 % (ref 0.0–0.2)

## 2023-11-24 LAB — COMPREHENSIVE METABOLIC PANEL WITH GFR
ALT: 22 U/L (ref 0–44)
AST: 19 U/L (ref 15–41)
Albumin: 3.7 g/dL (ref 3.5–5.0)
Alkaline Phosphatase: 62 U/L (ref 38–126)
Anion gap: 8 (ref 5–15)
BUN: 15 mg/dL (ref 6–20)
CO2: 25 mmol/L (ref 22–32)
Calcium: 9.3 mg/dL (ref 8.9–10.3)
Chloride: 105 mmol/L (ref 98–111)
Creatinine, Ser: 1.08 mg/dL — ABNORMAL HIGH (ref 0.44–1.00)
GFR, Estimated: >60 mL/min (ref >60)
Glucose, Bld: 128 mg/dL — ABNORMAL HIGH (ref 70–99)
Potassium: 4.1 mmol/L (ref 3.5–5.1)
Sodium: 138 mmol/L (ref 135–145)
Total Bilirubin: 0.6 mg/dL (ref 0.0–1.2)
Total Protein: 7.4 g/dL (ref 6.5–8.1)

## 2023-11-24 LAB — GLUCOSE, CAPILLARY
Glucose-Capillary: 122 mg/dL — ABNORMAL HIGH (ref 70–99)
Glucose-Capillary: 119 mg/dL — ABNORMAL HIGH (ref 70–99)
Glucose-Capillary: 88 mg/dL (ref 70–99)
Glucose-Capillary: 172 mg/dL — ABNORMAL HIGH (ref 70–99)

## 2023-11-24 LAB — HEMOGLOBIN A1C
Hgb A1c MFr Bld: 8 % — ABNORMAL HIGH (ref 4.8–5.6)
Mean Plasma Glucose: 182.9 mg/dL

## 2023-11-24 LAB — CREATININE, SERUM
Creatinine, Ser: 1.04 mg/dL — ABNORMAL HIGH (ref 0.44–1.00)
GFR, Estimated: >60 mL/min (ref >60)

## 2023-11-24 LAB — BETA-HYDROXYBUTYRIC ACID: Beta-Hydroxybutyric Acid: 0.09 mmol/L (ref 0.05–0.27)

## 2023-11-24 NOTE — ED Notes (Signed)
 Pt ambulated to and from restroom.

## 2023-11-24 NOTE — Inpatient Diabetes Management (Signed)
 Inpatient Diabetes Program Recommendations  AACE/ADA: New Consensus Statement on Inpatient Glycemic Control (2015)  Target Ranges:  Prepandial:   less than 140 mg/dL      Peak postprandial:   less than 180 mg/dL (1-2 hours)      Critically ill patients:  140 - 180 mg/dL   Lab Results  Component Value Date   GLUCAP 122 (H) 11/24/2023   HGBA1C 8.0 (H) 11/24/2023    Review of Glycemic Control  Diabetes history: DM 2 Outpatient Diabetes medications: Trulicity  injection weekly on Sundays, Glipizide  10 mg increased the beginning of this year, Basaglar  40 units bid started back last Sunday. Current orders for Inpatient glycemic control:  D10 IV fluids 50 ml/hour  A1c 8 on 7/1  Spoke with pt at bedside. Pt reports usually being in the 200-300 range. Pt reports last insulin  dose was Sunday morning. Pt having hypoglycemia since Sunday. Spoke with pt about glucose and A1c goals. Told pt we wait for her trends to recover then will discontinue Dextrose  IV then reintroduce insulin  doses.  Will follow pt trends while here.  Thanks,  Clotilda Bull RN, MSN, BC-ADM Inpatient Diabetes Coordinator Team Pager [phone] (8a-5p)

## 2023-11-24 NOTE — ED Notes (Signed)
Patient is resting comfortably in NAD at this time.

## 2023-11-24 NOTE — Progress Notes (Signed)
 Courtesy visit No billing-  Patient is seen and examined today morning.  She is admitted to the hospitalist service for hypoglycemia.  Patient's blood sugars better on D5.  She is eating better.  Denies any complaints of dizziness, lightheadedness.  No nausea, abdominal pain or chest pain.  I stopped IV fluids.  Advised to continue checking blood sugars every 2 hours.  Discussed with her regarding symptoms of hypoglycemia, management of diabetes, advised to stop glipizide .  Diabetes educator consult placed.  Will plan to discharge her tomorrow if her blood sugars remained stable, may have to decrease her insulin  regimen upon discharge.

## 2023-11-24 NOTE — H&P (Signed)
 History and Physical    Kathryn [name] FMW:[id number] DOB: 1970-11-11 DOA: 11/23/2023  Patient coming from: Home.  Chief Complaint: Low blood sugar.  HPI: Kathryn [name] is a 53 y.o. female with history of diabetes mellitus type 2, hypertension, schizoaffective disorder, hypothyroidism, neuropathy presents to the ER after patient was found to be hypoglycemic.  Patient was started back on insulin  which she has not been taking for many months along with Trulicity .  Patient's hemoglobin A1c was 14 4 months ago then 10.7  a month ago.  Patient states she has been eating well but over the last 2 days she has been running low blood sugar despite which she was still taking her Lantus  80 units which she has been taking in split dosing of 40 twice daily.  In addition patient also was placed on glipizide  10 mg twice daily about 2 months ago which patient has been taking.  ED Course: In the ER patient remained persistently hypoglycemic had to eventually be started on D10.  Labs show creatinine of 1 hemoglobin 11.2.  Review of Systems: As per HPI, rest all negative.   Past Medical History:  Diagnosis Date   Anemia    Anxiety    Asthma    Depression    GERD (gastroesophageal reflux disease)    HLD (hyperlipidemia) 04/07/2019   Hypertension    Hypothyroidism, adult 04/07/2019   Neuropathy    Obesity (BMI 30.0-34.9) 04/07/2019   Schizoaffective disorder (HCC)    Type II diabetes mellitus, uncontrolled 04/27/2019    Past Surgical History:  Procedure Laterality Date   BACK SURGERY  09/06/2020   BIOPSY  05/03/2021   Procedure: BIOPSY;  Surgeon: Eartha Angelia Sieving, MD;  Location: [facility];  Service: Gastroenterology;;   BREAST SURGERY Right    biopsy   CESAREAN SECTION     CHOLECYSTECTOMY  06/02/2012   Procedure: LAPAROSCOPIC CHOLECYSTECTOMY;  Surgeon: Oneil DELENA Budge, MD;  Location: [facility];  Service: General;  Laterality: N/A;  Attempted laparoscopic cholecystectomy    CHOLECYSTECTOMY  06/02/2012   Procedure: CHOLECYSTECTOMY;  Surgeon: Oneil DELENA Budge, MD;  Location: [facility];  Service: General;  Laterality: N/A;  converted to open at  0905   COLONOSCOPY WITH PROPOFOL  N/A 07/18/2020   prep was fair, one 4mm polyp (inflammatory) stool in descending colon, transverse and ascending, distal rectum and anal verge normal   ESOPHAGEAL DILATION  12/31/2018   Procedure: ESOPHAGEAL DILATION;  Surgeon: Golda Claudis PENNER, MD;  Location: [facility];  Service: Endoscopy;;   ESOPHAGOGASTRODUODENOSCOPY (EGD) WITH PROPOFOL  N/A 08/24/2015   Procedure: ESOPHAGOGASTRODUODENOSCOPY (EGD) WITH PROPOFOL ;  Surgeon: Claudis PENNER Golda, MD;  Location: [facility];  Service: Endoscopy;  Laterality: N/A;  1:10 - Ann to notify pt to arrive at 11:30   ESOPHAGOGASTRODUODENOSCOPY (EGD) WITH PROPOFOL  N/A 12/31/2018   normal, no abnormality to explain dysphagia, esophagus dilated.   ESOPHAGOGASTRODUODENOSCOPY (EGD) WITH PROPOFOL  N/A 05/03/2021   Procedure: ESOPHAGOGASTRODUODENOSCOPY (EGD) WITH PROPOFOL ;  Surgeon: Eartha Angelia Sieving, MD;  Location: [facility];  Service: Gastroenterology;  Laterality: N/A;  1:20   FLEXIBLE SIGMOIDOSCOPY  07/17/2020   Procedure: FLEXIBLE SIGMOIDOSCOPY;  Surgeon: Eartha Angelia Sieving, MD;  Location: [facility];  Service: Gastroenterology;;   POLYPECTOMY  07/18/2020   Procedure: POLYPECTOMY INTESTINAL;  Surgeon: Eartha Angelia Sieving, MD;  Location: [facility];  Service: Gastroenterology;;  ascending colon polyp;    SAVORY DILATION  05/03/2021   Procedure: SAVORY DILATION;  Surgeon: Eartha Angelia Sieving, MD;  Location:  [facility];  Service: Gastroenterology;;   tooth removal  2019   all teeth removed   TUBAL LIGATION     X3     reports that she has never smoked. She has never been exposed to tobacco smoke. She has never used smokeless tobacco. She reports that she does not drink alcohol and does not use drugs.  Allergies  Allergen  Reactions   Metformin Hcl Diarrhea    Family History  Problem Relation Age of Onset   Hypertension Mother    Alcohol abuse Mother    Heart disease Mother    Kidney disease Mother    Aneurysm Mother        brain   Hypertension Father    Alcohol abuse Sister    Alcohol abuse Maternal Aunt    Cancer Maternal Aunt    Alcohol abuse Paternal Aunt    Alcohol abuse Maternal Grandmother    Alcohol abuse Maternal Grandfather    Hearing loss Daughter    Alcohol abuse Cousin    Stroke Other    Diabetes Other    Cancer Other    Seizures Other     Prior to Admission medications   Medication Sig Start Date End Date Taking? Authorizing Provider  albuterol  (VENTOLIN  HFA) 108 (90 Base) MCG/ACT inhaler Inhale 2 puffs into the lungs every 6 (six) hours as needed for wheezing or shortness of breath. 09/16/22  Yes Zarwolo, Gloria, FNP  amLODipine  (NORVASC ) 10 MG tablet Take 1 tablet (10 mg total) by mouth daily. 11/06/23  Yes Paseda, Folashade R, FNP  aspirin  EC 81 MG tablet Take 81 mg by mouth daily.   Yes [provider]  atorvastatin  (LIPITOR) 10 MG tablet Take 1 tablet (10 mg total) by mouth daily. 11/06/23  Yes Paseda, Folashade R, FNP  benztropine  (COGENTIN ) 1 MG tablet TAKE 1 TABLET(1 MG) BY MOUTH AT BEDTIME 10/23/23  Yes Paseda, Folashade R, FNP  Blood Glucose Monitoring Suppl (TRUE METRIX METER) w/Device KIT Use as directed to monitor blood sugar. 11/06/23  Yes Paseda, Folashade R, FNP  carvedilol  (COREG ) 12.5 MG tablet Take 1 tablet (12.5 mg total) by mouth 2 (two) times daily. 11/06/23  Yes Paseda, Folashade R, FNP  Cholecalciferol (VITAMIN D -3) 125 MCG (5000 UT) TABS Take 2 tablets by mouth daily. 10/13/19  Yes [name], Gebreselassie W, MD  clonazePAM  (KLONOPIN ) 0.5 MG tablet Take 1 tablet (0.5 mg total) by mouth 2 (two) times daily as needed for anxiety. 03/10/23  Yes Okey Barnie SAUNDERS, MD  Dulaglutide  (TRULICITY ) 0.75 MG/0.5ML SOAJ Inject 0.75 mg into the skin once a week. 11/06/23  Yes  Paseda, Folashade R, FNP  Dulaglutide  (TRULICITY ) 1.5 MG/0.5ML SOAJ Inject 1.5 mg into the skin once a week. 11/20/23  Yes Paseda, Folashade R, FNP  DULoxetine  (CYMBALTA ) 60 MG capsule Take 1 capsule (60 mg total) by mouth 2 (two) times daily. 11/06/23  Yes Paseda, Folashade R, FNP  gabapentin  (NEURONTIN ) 300 MG capsule Take 1 capsule (300 mg total) by mouth daily. 10/23/23 04/20/24 Yes Paseda, Folashade R, FNP  glipiZIDE  (GLUCOTROL ) 10 MG tablet Take 1 tablet (10 mg total) by mouth 2 (two) times daily before a meal. 10/23/23  Yes Paseda, Folashade R, FNP  glucose blood (TRUE METRIX BLOOD GLUCOSE TEST) test strip Use as instructed to monitor blood sugar twice daily 11/06/23  Yes Paseda, Folashade R, FNP  Insulin  Glargine (BASAGLAR  KWIKPEN) 100 UNIT/ML Inject 40 Units into the skin 2 (two) times daily. May increase up to 50 units  twice daily if instructed by your provider. 11/06/23  Yes Paseda, Folashade R, FNP  levothyroxine  (SYNTHROID ) 75 MCG tablet Take 1 tablet (75 mcg total) by mouth daily before breakfast. 10/26/23  Yes Paseda, Folashade R, FNP  losartan  (COZAAR ) 100 MG tablet Take 1 tablet (100 mg total) by mouth daily. 07/24/23  Yes Paseda, Folashade R, FNP  Multiple Vitamin (MULTIVITAMIN) tablet Take 1 tablet by mouth daily.   Yes [provider]  omeprazole  (PRILOSEC) 20 MG capsule Take 20 mg by mouth daily. 02/11/16  Yes [provider]  risperiDONE  (RISPERDAL ) 2 MG tablet TAKE 1 TABLET EVERY MORNING AND 3 TABLETS AT BEDTIME 03/10/23  Yes Okey Barnie SAUNDERS, MD  rizatriptan  (MAXALT ) 10 MG tablet Take 1 tablet (10 mg total) by mouth as needed for migraine. May repeat in 2 hours if needed 10/23/23  Yes Paseda, Folashade R, FNP  topiramate  (TOPAMAX ) 100 MG tablet TAKE 1 TABLET BY MOUTH EVERYDAY AT BEDTIME 08/10/23  Yes Paseda, Folashade R, FNP  traZODone  (DESYREL ) 100 MG tablet TAKE 2 TABLETS(200 MG) BY MOUTH AT BEDTIME 08/10/23  Yes Okey Barnie SAUNDERS, MD  TRUEplus Lancets 30G MISC Use as  directed to monitor blood sugar twice daily 11/06/23  Yes Paseda, Folashade R, FNP    Physical Exam: Constitutional: Moderately built and nourished. Vitals:   11/23/23 1242 11/23/23 1702 11/23/23 2225 11/24/23 0243  BP:  (!) 146/91  (!) 144/88  Pulse:  87  88  Resp:  16  20  Temp:  97.7 F (36.5 C) 98.2 F (36.8 C) 98.6 F (37 C)  TempSrc:  Oral Oral Oral  SpO2:  100%  100%  Weight: 70.3 kg     Height: 5' (1.524 m)      Eyes: Anicteric no pallor. ENMT: No discharge from the ears/nose or mouth. Neck: No mass felt.  No neck rigidity. Respiratory: No rhonchi or crepitations. Cardiovascular: S1-S2 heard. Abdomen: Soft nontender bowel sound present. Musculoskeletal: No edema. Skin: No rash. Neurologic: Alert awake oriented time place and person.  Moves all extremities. Psychiatric: Appears normal.  Normal affect.   Labs on Admission: I have personally reviewed following labs and imaging studies  CBC: Recent Labs  Lab 11/23/23 1400 11/24/23 0243  WBC 6.7 6.7  NEUTROABS 2.9  --   HGB 11.2* 10.8*  HCT 34.7* 32.8*  MCV 89.7 87.9  PLT 297 285   Basic Metabolic Panel: Recent Labs  Lab 11/23/23 1400 11/24/23 0243  NA 137  --   K 3.9  --   CL 105  --   CO2 24  --   GLUCOSE 115*  --   BUN 14  --   CREATININE 1.02* 1.04*  CALCIUM  8.8*  --    GFR: Estimated Creatinine Clearance: 55.3 mL/min (A) (by C-G formula based on SCr of 1.04 mg/dL (H)). Liver Function Tests: Recent Labs  Lab 11/23/23 1400  AST 19  ALT 24  ALKPHOS 60  BILITOT 0.9  PROT 7.7  ALBUMIN 3.8   No results for input(s): LIPASE, AMYLASE in the last 168 hours. No results for input(s): AMMONIA in the last 168 hours. Coagulation Profile: No results for input(s): INR, PROTIME in the last 168 hours. Cardiac Enzymes: No results for input(s): CKTOTAL, CKMB, CKMBINDEX, TROPONINI in the last 168 hours. BNP (last 3 results) No results for input(s): PROBNP in the last 8760  hours. HbA1C: Recent Labs    11/24/23 0243  HGBA1C 8.0*   CBG: Recent Labs  Lab 11/23/23 1310 11/23/23 1657  11/23/23 1813 11/23/23 2004 11/24/23 0315  GLUCAP 74 48* 58* 99 125*   Lipid Profile: No results for input(s): CHOL, HDL, LDLCALC, TRIG, CHOLHDL, LDLDIRECT in the last 72 hours. Thyroid  Function Tests: No results for input(s): TSH, T4TOTAL, FREET4, T3FREE, THYROIDAB in the last 72 hours. Anemia Panel: No results for input(s): VITAMINB12, FOLATE, FERRITIN, TIBC, IRON, RETICCTPCT in the last 72 hours. Urine analysis:    Component Value Date/Time   COLORURINE COLORLESS (A) 11/23/2023 1610   APPEARANCEUR CLEAR 11/23/2023 1610   LABSPEC 1.002 (L) 11/23/2023 1610   PHURINE 7.0 11/23/2023 1610   GLUCOSEU NEGATIVE 11/23/2023 1610   HGBUR NEGATIVE 11/23/2023 1610   BILIRUBINUR NEGATIVE 11/23/2023 1610   KETONESUR NEGATIVE 11/23/2023 1610   PROTEINUR NEGATIVE 11/23/2023 1610   UROBILINOGEN 1.0 01/20/2015 0100   NITRITE NEGATIVE 11/23/2023 1610   LEUKOCYTESUR NEGATIVE 11/23/2023 1610   Sepsis Labs: @LABRCNTIP (procalcitonin:4,lacticidven:4) )No results found for this or any previous visit (from the past 240 hours).   Radiological Exams on Admission: DG Abd 1 View Result Date: 11/24/2023 CLINICAL DATA:  Nausea and vomiting EXAM: ABDOMEN - 1 VIEW COMPARISON:  None Available. FINDINGS: Nonobstructive bowel gas pattern. Moderate colonic stool burden. Cholecystectomy. Stones in the medial left abdomen compatible with phleboliths as seen on CT 09/06/2015. IMPRESSION: Nonobstructive bowel gas pattern. Moderate colonic stool burden. Electronically Signed   By: Norman Gatlin M.D.   On: 11/24/2023 02:41   DG Chest 2 View Result Date: 11/23/2023 CLINICAL DATA:  Midline chest pain. EXAM: CHEST - 2 VIEW COMPARISON:  February 16, 2017 FINDINGS: The heart size and mediastinal contours are within normal limits. Both lungs are clear. Radiopaque surgical  clips are seen within the right upper quadrant. The visualized skeletal structures are unremarkable. IMPRESSION: No active cardiopulmonary disease. Electronically Signed   By: Suzen Dials M.D.   On: 11/23/2023 17:42      Assessment/Plan Principal Problem:   Hypoglycemia Active Problems:   Schizoaffective disorder (HCC)   Essential hypertension, benign   Chronic GERD   IBS (irritable bowel syndrome)   Hypothyroidism   Dyslipidemia, goal LDL below 70   Uncontrolled diabetes mellitus with hyperglycemia, with long-term current use of insulin  (HCC)   Moderate persistent asthma    Hypoglycemia in the setting of diabetes mellitus type 2 with insulin  glipizide .  Patient is glipizide  dose was increased 2 months ago and Trulicity  dose was increased last week.  Patient takes Lantus  40 units twice daily.  For now holding all hypoglycemics and patient is on D10.  Closely monitor Accu-Cheks.  Recheck hemoglobin A1c. Hypertension on amlodipine  Coreg  losartan . Schizoaffective disorder on Cogentin  Risperdal  Klonopin  duloxetine . Hypothyroid on Synthroid . Neuropathy on gabapentin . History of IBS used to be on Linzess  which patient was not able to afford.  Abdomen appears mildly distended will check KUB. Anemia appears to be chronic follow CBC.  Since patient has persistent hypoglycemia will need close monitoring and more than 2 midnight stay.   DVT prophylaxis: Lovenox . Code Status: Full code. Family Communication: Discussed with patient' husband. Disposition Plan: Progressive care. Consults called: None. Admission status: Observation.

## 2023-11-24 NOTE — ED Notes (Signed)
Pt ambulated to and from the restroom.

## 2023-11-25 LAB — GLUCOSE, CAPILLARY
Glucose-Capillary: 124 mg/dL — ABNORMAL HIGH (ref 70–99)
Glucose-Capillary: 129 mg/dL — ABNORMAL HIGH (ref 70–99)
Glucose-Capillary: 137 mg/dL — ABNORMAL HIGH (ref 70–99)
Glucose-Capillary: 144 mg/dL — ABNORMAL HIGH (ref 70–99)
Glucose-Capillary: 150 mg/dL — ABNORMAL HIGH (ref 70–99)
Glucose-Capillary: 151 mg/dL — ABNORMAL HIGH (ref 70–99)
Glucose-Capillary: 171 mg/dL — ABNORMAL HIGH (ref 70–99)
Glucose-Capillary: 183 mg/dL — ABNORMAL HIGH (ref 70–99)
Glucose-Capillary: 188 mg/dL — ABNORMAL HIGH (ref 70–99)

## 2023-11-25 LAB — C-PEPTIDE: C-Peptide: 4.4 ng/mL (ref 1.1–4.4)

## 2023-11-25 MED ORDER — CARVEDILOL 6.25 MG PO TABS
6.2500 mg | ORAL_TABLET | Freq: Two times a day (BID) | ORAL | Status: DC
  Administered 2023-11-25: 6.25 mg via ORAL
  Filled 2023-11-25 (×2): qty 1

## 2023-11-25 MED ORDER — INSULIN ASPART 100 UNIT/ML IJ SOLN
0.0000 [IU] | Freq: Three times a day (TID) | INTRAMUSCULAR | Status: DC
  Administered 2023-11-25: 1 [IU] via SUBCUTANEOUS
  Administered 2023-11-26: 2 [IU] via SUBCUTANEOUS
  Administered 2023-11-26: 1 [IU] via SUBCUTANEOUS
  Administered 2023-11-27: 2 [IU] via SUBCUTANEOUS

## 2023-11-25 MED ORDER — INSULIN ASPART 100 UNIT/ML IJ SOLN: 0.0000 [IU] | Freq: Every day | INTRAMUSCULAR | Status: DC

## 2023-11-25 MED ORDER — HYDRALAZINE HCL 20 MG/ML IJ SOLN: 10.0000 mg | Freq: Four times a day (QID) | INTRAMUSCULAR | Status: DC | PRN

## 2023-11-25 NOTE — Progress Notes (Signed)
   11/25/23 0900  TOC Brief Assessment  Insurance and Status Reviewed  Patient has primary care physician Yes  Home environment has been reviewed lives home with husband  Prior level of function: independent  Prior/Current Home Services No current home services  Social Drivers of Health Review SDOH reviewed no interventions necessary  Readmission risk has been reviewed Yes  Transition of care needs no transition of care needs at this time

## 2023-11-25 NOTE — Evaluation (Signed)
 Physical Therapy Brief Evaluation and Discharge Note Patient Details Name: Kathryn [name] MRN: [id number] DOB: Sep 05, 1970 Today's Date: 11/25/2023   History of Present Illness  53 y.o. female  presents to the ER after patient was found to be hypoglycemic. Past history of diabetes mellitus type 2, hypertension, schizoaffective disorder, hypothyroidism, neuropathy.  Clinical Impression  Pt presents with admitting diagnosis above. Pt today was able to ambulate in hallway and navigate stairs independently. PTA pt was fully independent. Pt presents at or near baseline mobility. Pt has no further acute PT needs and will be signing off. Re consult PT if mobility status changes. Pt would benefit from continued mobility with mobility specialist during acute stay.        PT Assessment Patient does not need any further PT services  Assistance Needed at Discharge  PRN    Equipment Recommendations None recommended by PT  Recommendations for Other Services       Precautions/Restrictions Precautions Precautions: Fall Recall of Precautions/Restrictions: Intact Restrictions Weight Bearing Restrictions Per Provider Order: No        Mobility  Bed Mobility   Supine/Sidelying to sit: Independent Sit to supine/sidelying: Independent    Transfers Overall transfer level: Independent Equipment used: None                    Ambulation/Gait Ambulation/Gait assistance: Independent Gait Distance (Feet): 200 Feet Assistive device: None Gait Pattern/deviations: WFL(Within Functional Limits) Gait Speed: Pace WFL General Gait Details: slowed step through pattern  Home Activity Instructions    Stairs Stairs: Yes Stairs assistance: Independent Stair Management: One rail Right, Step to pattern, Forwards Number of Stairs: 4 General stair comments: no LOB noted.  Modified Rankin (Stroke Patients Only)        Balance Overall balance assessment: Independent                         Pertinent Vitals/Pain PT - Brief Vital Signs All Vital Signs Stable: Yes Pain Assessment Pain Assessment: No/denies pain     Home Living Family/patient expects to be discharged to:: Private residence Living Arrangements: Spouse/significant other Available Help at Discharge: Family Home Environment: Stairs to enter;Rail - right  Stairs-Number of Steps: 4 Home Equipment: None        Prior Function Level of Independence: Independent      UE/LE Assessment   UE ROM/Strength/Tone/Coordination: WFL    LE ROM/Strength/Tone/Coordination: [facility]      Communication   Communication Communication: No apparent difficulties     Cognition Overall Cognitive Status: Appears within functional limits for tasks assessed/performed       General Comments General comments (skin integrity, edema, etc.): VSS    Exercises     Assessment/Plan    PT Problem List         PT Visit Diagnosis Other abnormalities of gait and mobility (R26.89)    No Skilled PT Patient at baseline level of functioning;Patient is independent with all acitivity/mobility   Co-evaluation                AMPAC 6 Clicks Help needed turning from your back to your side while in a flat bed without using bedrails?: None Help needed moving from lying on your back to sitting on the side of a flat bed without using bedrails?: None Help needed moving to and from a bed to a chair (including a wheelchair)?: None Help needed standing up from a chair using your arms (e.g., wheelchair or  bedside chair)?: None Help needed to walk in hospital room?: None Help needed climbing 3-5 steps with a railing? : None 6 Click Score: 24      End of Session Equipment Utilized During Treatment: Gait belt Activity Tolerance: Patient tolerated treatment well Patient left: in bed;with call bell/phone within reach;with family/visitor present Nurse Communication: Mobility status PT Visit Diagnosis: Other abnormalities of  gait and mobility (R26.89)     Time: 1255-1305 PT Time Calculation (min) (ACUTE ONLY): 10 min  Charges:   PT Evaluation $PT Eval Low Complexity: 1 Low      Kathryn [name], PT, DPT Acute Rehab Services [phone]   Laasya Peyton  11/25/2023, 1:16 PM

## 2023-11-25 NOTE — Progress Notes (Signed)
 PROGRESS NOTE                                                                                                                                                                                                             Patient Demographics:    Kathryn [name], is a 53 y.o. female, DOB - 1970/12/09, FMW:[id number]  Outpatient Primary MD for the patient is Paseda, Folashade R, FNP    LOS - 1  Admit date - 11/23/2023    Chief Complaint  Patient presents with   Hypoglycemia    Pt took insulin  this am and states her blood sugar this am was 49. States it has been dropping since yesterday. Pt is alert and oriented. Ambulatory.        Brief Narrative (HPI from H&P)   53 y.o. female with history of diabetes mellitus type 2, hypertension, schizoaffective disorder, hypothyroidism, neuropathy presents to the ER after patient was found to be hypoglycemic.  Patient was started back on insulin  which she has not been taking for many months along with Trulicity .  Patient's hemoglobin A1c was 14 4 months ago then 10.7  a month ago.  She was on a combination of high-dose insulin  along with oral hypoglycemics, medications were being actively adjusted recently, she was admitted for hypoglycemia and placed on D10 drip.   Subjective:    Kathryn [name] today has, No headache, No chest pain, No abdominal pain - No Nausea, No new weakness tingling or numbness, no SOB   Assessment  & Plan :   Hypoglycemia in the setting of diabetes mellitus type 2 with on combination of high-dose glipizide , high-dose long-acting insulin  and sliding scale.  Patient is glipizide  dose was increased 2 months ago and insulin  dose was adjusted recently as well, currently off of all offending medications, kept on D10 drip for 24 hours, CBGs improving, stop glucose drip and start on sliding scale will likely require addition of long-acting insulin  soon as well, might take a day or 2  to place her on a stable medication regimen.. Hypertension blood pressure slightly soft continue Coreg  at lower than home dose, continue home dose losartan , discontinue Norvasc . Schizoaffective disorder on Cogentin  Risperdal  Klonopin  duloxetine . Hypothyroid on Synthroid . Neuropathy on gabapentin . History of IBS supportive care benign abdominal exam, no symptoms. Anemia appears to be chronic follow CBC.  Condition - Fair  Family Communication  :  None  Code Status :  Full  Consults  :  DM educator  PUD Prophylaxis :     Procedures  :          Disposition Plan  :    Status is: Inpatient   DVT Prophylaxis  :    enoxaparin  (LOVENOX ) injection 40 mg Start: 11/24/23 0800   Lab Results  Component Value Date   PLT 279 11/24/2023    Diet :  Diet Order             Diet Carb Modified Fluid consistency: Thin; Room service appropriate? Yes  Diet effective now                    Inpatient Medications  Scheduled Meds:  atorvastatin   10 mg Oral Daily   benztropine   1 mg Oral QHS   carvedilol   12.5 mg Oral BID   dextrose   1 Tube Oral Once   DULoxetine   60 mg Oral BID   enoxaparin  (LOVENOX ) injection  40 mg Subcutaneous Q24H   gabapentin   300 mg Oral Daily   insulin  aspart  0-5 Units Subcutaneous QHS   insulin  aspart  0-9 Units Subcutaneous TID WC   levothyroxine   75 mcg Oral QAC breakfast   losartan   100 mg Oral Daily   risperiDONE   2 mg Oral Daily   risperiDONE   6 mg Oral QHS   traZODone   200 mg Oral QHS   Continuous Infusions: PRN Meds:.acetaminophen  **OR** acetaminophen , clonazePAM , hydrALAZINE   Antibiotics  :    Anti-infectives (From admission, onward)    None         Objective:   Vitals:   11/24/23 2200 11/25/23 0041 11/25/23 0531 11/25/23 0758  BP: 116/71 108/68 123/80 103/67  Pulse:  92 97 (!) 108  Resp:  20 18 16   Temp:  98 F (36.7 C) 98.2 F (36.8 C) 98.8 F (37.1 C)  TempSrc:  Oral Oral Oral  SpO2:  96% 99% 96%   Weight:      Height:        Wt Readings from Last 3 Encounters:  11/23/23 70.3 kg  10/23/23 72.1 kg  07/27/23 75 kg     Intake/Output Summary (Last 24 hours) at 11/25/2023 0921 Last data filed at 11/24/2023 1400 Gross per 24 hour  Intake 1925.02 ml  Output --  Net 1925.02 ml     Physical Exam  Awake Alert, No new F.N deficits, Normal affect .AT,PERRAL Supple Neck, No JVD,   Symmetrical Chest wall movement, Good air movement bilaterally, CTAB RRR,No Gallops,Rubs or new Murmurs,  +ve B.Sounds, Abd Soft, No tenderness,   No Cyanosis, Clubbing or edema      Data Review:    Recent Labs  Lab 11/23/23 1400 11/24/23 0243 11/24/23 1307  WBC 6.7 6.7 5.7  HGB 11.2* 10.8* 10.9*  HCT 34.7* 32.8* 33.5*  PLT 297 285 279  MCV 89.7 87.9 87.5  MCH 28.9 29.0 28.5  MCHC 32.3 32.9 32.5  RDW 12.8 12.5 12.6  LYMPHSABS 3.2  --   --   MONOABS 0.5  --   --   EOSABS 0.2  --   --   BASOSABS 0.0  --   --     Recent Labs  Lab 11/23/23 1400 11/24/23 0243 11/24/23 1307  NA 137  --  138  K 3.9  --  4.1  CL 105  --  105  CO2  24  --  25  ANIONGAP 8  --  8  GLUCOSE 115*  --  128*  BUN 14  --  15  CREATININE 1.02* 1.04* 1.08*  AST 19  --  19  ALT 24  --  22  ALKPHOS 60  --  62  BILITOT 0.9  --  0.6  ALBUMIN 3.8  --  3.7  HGBA1C  --  8.0*  --   CALCIUM  8.8*  --  9.3      Recent Labs  Lab 11/23/23 1400 11/24/23 0243 11/24/23 1307  HGBA1C  --  8.0*  --   CALCIUM  8.8*  --  9.3    --------------------------------------------------------------------------------------------------------------- Lab Results  Component Value Date   CHOL 98 (L) 02/25/2023   HDL 48 02/25/2023   LDLCALC 35 02/25/2023   TRIG 71 02/25/2023   CHOLHDL 2.0 02/25/2023    Lab Results  Component Value Date   HGBA1C 8.0 (H) 11/24/2023   No results for input(s): TSH, T4TOTAL, FREET4, T3FREE, THYROIDAB in the last 72 hours. No results for input(s): VITAMINB12, FOLATE, FERRITIN,  TIBC, IRON, RETICCTPCT in the last 72 hours. ------------------------------------------------------------------------------------------------------------------ Cardiac Enzymes No results for input(s): CKMB, TROPONINI, MYOGLOBIN in the last 168 hours.  Invalid input(s): CK  Micro Results No results found for this or any previous visit (from the past 240 hours).  Radiology Report DG Abd 1 View Result Date: 11/24/2023 CLINICAL DATA:  Nausea and vomiting EXAM: ABDOMEN - 1 VIEW COMPARISON:  None Available. FINDINGS: Nonobstructive bowel gas pattern. Moderate colonic stool burden. Cholecystectomy. Stones in the medial left abdomen compatible with phleboliths as seen on CT 09/06/2015. IMPRESSION: Nonobstructive bowel gas pattern. Moderate colonic stool burden. Electronically Signed   By: Norman Gatlin M.D.   On: 11/24/2023 02:41   DG Chest 2 View Result Date: 11/23/2023 CLINICAL DATA:  Midline chest pain. EXAM: CHEST - 2 VIEW COMPARISON:  February 16, 2017 FINDINGS: The heart size and mediastinal contours are within normal limits. Both lungs are clear. Radiopaque surgical clips are seen within the right upper quadrant. The visualized skeletal structures are unremarkable. IMPRESSION: No active cardiopulmonary disease. Electronically Signed   By: Suzen Dials M.D.   On: 11/23/2023 17:42     Signature  -   Lavada Stank M.D on 11/25/2023 at 9:21 AM   -  To page go to [email or web address]

## 2023-11-25 NOTE — TOC Initial Note (Signed)
 Transition of Care [facility]) - Initial/Assessment Note    Patient Details  Name: Kathryn [name] MRN: [id number] Date of Birth: 28-Mar-1971  Transition of [facility]) CM/SW Contact:    Nola Devere Hands, RN Phone Number: 11/25/2023, 9:03 AM  Clinical Narrative:                 Patient is 53 yr old female admitted with hypoglycemia. Medications being adjusted. No TOC needs identified at this time. We will continue to follow.        Patient Goals and CMS Choice            Expected Discharge Plan and Services                                              Prior Living Arrangements/Services                       Activities of Daily Living   ADL Screening (condition at time of admission) Independently performs ADLs?: Yes (appropriate for developmental age) Is the patient deaf or have difficulty hearing?: No Does the patient have difficulty seeing, even when wearing glasses/contacts?: No Does the patient have difficulty concentrating, remembering, or making decisions?: No  Permission Sought/Granted                  Emotional Assessment              Admission diagnosis:  Hypoglycemia [E16.2] Patient Active Problem List   Diagnosis Date Noted   Hypoglycemia 11/23/2023   Breast tenderness 10/23/2023   Soft tissue infection 07/24/2023   Tinnitus aurium, left 02/24/2023   Annual physical exam 02/24/2023   Bilateral calf pain 01/27/2023   Dysphagia 08/07/2022   Gastroparesis 08/07/2022   Trigger finger 03/05/2022   Encounter for general adult medical examination with abnormal findings 03/05/2022   Nausea 01/08/2022   Wheezing 01/08/2022   Migraine 10/31/2021   Depression, recurrent (HCC) 10/31/2021   Great toe pain, right 10/03/2021   Chest pain 08/29/2021   Chronic tension-type headache, not intractable 08/29/2021   Encounter for examination following treatment at hospital 07/30/2021   Uncontrolled type 2 diabetes mellitus with  hyperglycemia, with long-term current use of insulin  (HCC) 07/30/2021   Polyneuropathy 07/17/2021   Anxiety 07/01/2021   Diabetic autonomic neuropathy associated with type 2 diabetes mellitus (HCC) 07/01/2021   Need for influenza vaccination 07/01/2021   Need for varicella vaccine 07/01/2021   Constipation 04/23/2021   Pain of upper abdomen 04/23/2021   Peripheral polyneuropathy 03/28/2021   Lumbar radiculopathy 12/13/2020   Essential (primary) hypertension 10/01/2020   Body mass index (BMI) 30.0-30.9, adult 10/01/2020   Moderate persistent asthma 09/28/2020   Disc displacement, lumbar 08/27/2020   Elevated blood-pressure reading, without diagnosis of hypertension 08/27/2020   Non-adherence to medical treatment 08/02/2020   Functional dyspepsia 06/07/2020   Vitamin D  deficiency 07/15/2019   Uncontrolled diabetes mellitus with hyperglycemia, with long-term current use of insulin  (HCC) 04/27/2019   Hypothyroidism 04/07/2019   Obesity (BMI 30.0-34.9) 04/07/2019   Dyslipidemia, goal LDL below 70 04/07/2019   Abnormal esophagram 11/23/2018   Chronic GERD 10/08/2015   IBS (irritable bowel syndrome) 10/08/2015   Essential hypertension, benign 08/22/2015   Schizoaffective disorder (HCC) 02/08/2015   PTSD (post-traumatic stress disorder) 02/08/2015   PCP:  Paseda, Folashade R, FNP Pharmacy:   [facility] [address], [address] [place] [address] Phone: [phone] Fax: [phone]  [id number] [place],  - [address] AT [facility] [address] [place] [address] Phone: [phone] Fax: [phone]     Social Drivers of Health (SDOH) Social History: SDOH Screenings   Food Insecurity: No Food Insecurity (11/24/2023)  Housing: Unknown (11/24/2023)  Transportation Needs: No Transportation Needs (11/24/2023)  Utilities: Not At Risk (11/24/2023)  Depression (PHQ2-9): High Risk (10/23/2023)  Social  Connections: Socially Isolated (11/24/2023)  Tobacco Use: Low Risk  (11/24/2023)   SDOH Interventions:     Readmission Risk Interventions     No data to display

## 2023-11-26 LAB — CBC WITH DIFFERENTIAL/PLATELET
Abs Immature Granulocytes: 0 10*3/uL (ref 0.00–0.07)
Basophils Absolute: 0 10*3/uL (ref 0.0–0.1)
Basophils Relative: 1 %
Eosinophils Absolute: 0.2 10*3/uL (ref 0.0–0.5)
Eosinophils Relative: 4 %
HCT: 31.6 % — ABNORMAL LOW (ref 36.0–46.0)
Hemoglobin: 10.6 g/dL — ABNORMAL LOW (ref 12.0–15.0)
Immature Granulocytes: 0 %
Lymphocytes Relative: 45 %
Lymphs Abs: 2.8 10*3/uL (ref 0.7–4.0)
MCH: 29 pg (ref 26.0–34.0)
MCHC: 33.5 g/dL (ref 30.0–36.0)
MCV: 86.6 fL (ref 80.0–100.0)
Monocytes Absolute: 0.5 10*3/uL (ref 0.1–1.0)
Monocytes Relative: 8 %
Neutro Abs: 2.5 10*3/uL (ref 1.7–7.7)
Neutrophils Relative %: 42 %
Platelets: 290 10*3/uL (ref 150–400)
RBC: 3.65 MIL/uL — ABNORMAL LOW (ref 3.87–5.11)
RDW: 12.4 % (ref 11.5–15.5)
WBC: 6.1 10*3/uL (ref 4.0–10.5)
nRBC: 0 % (ref 0.0–0.2)

## 2023-11-26 LAB — BASIC METABOLIC PANEL WITH GFR
Anion gap: 9 (ref 5–15)
BUN: 23 mg/dL — ABNORMAL HIGH (ref 6–20)
CO2: 23 mmol/L (ref 22–32)
Calcium: 9.2 mg/dL (ref 8.9–10.3)
Chloride: 106 mmol/L (ref 98–111)
Creatinine, Ser: 1.37 mg/dL — ABNORMAL HIGH (ref 0.44–1.00)
GFR, Estimated: 46 mL/min — ABNORMAL LOW (ref >60)
Glucose, Bld: 183 mg/dL — ABNORMAL HIGH (ref 70–99)
Potassium: 4 mmol/L (ref 3.5–5.1)
Sodium: 138 mmol/L (ref 135–145)

## 2023-11-26 LAB — GLUCOSE, CAPILLARY
Glucose-Capillary: 119 mg/dL — ABNORMAL HIGH (ref 70–99)
Glucose-Capillary: 143 mg/dL — ABNORMAL HIGH (ref 70–99)
Glucose-Capillary: 149 mg/dL — ABNORMAL HIGH (ref 70–99)
Glucose-Capillary: 149 mg/dL — ABNORMAL HIGH (ref 70–99)
Glucose-Capillary: 163 mg/dL — ABNORMAL HIGH (ref 70–99)
Glucose-Capillary: 94 mg/dL (ref 70–99)

## 2023-11-26 LAB — MAGNESIUM: Magnesium: 2 mg/dL (ref 1.7–2.4)

## 2023-11-26 MED ORDER — LACTATED RINGERS IV SOLN: INTRAVENOUS | Status: DC

## 2023-11-26 MED ORDER — CARVEDILOL 3.125 MG PO TABS
3.1250 mg | ORAL_TABLET | Freq: Two times a day (BID) | ORAL | Status: DC
  Administered 2023-11-26 – 2023-11-27 (×2): 3.125 mg via ORAL
  Filled 2023-11-26 (×2): qty 1

## 2023-11-26 MED ORDER — POLYETHYLENE GLYCOL 3350 17 G PO PACK
17.0000 g | PACK | Freq: Two times a day (BID) | ORAL | Status: DC
  Administered 2023-11-26 – 2023-11-27 (×3): 17 g via ORAL
  Filled 2023-11-26 (×3): qty 1

## 2023-11-26 MED ORDER — TRAMADOL HCL 50 MG PO TABS
50.0000 mg | ORAL_TABLET | Freq: Two times a day (BID) | ORAL | Status: DC | PRN
  Administered 2023-11-26: 50 mg via ORAL
  Filled 2023-11-26: qty 1

## 2023-11-26 MED ORDER — SODIUM CHLORIDE 0.9% FLUSH: 10.0000 mL | INTRAVENOUS | Status: DC | PRN

## 2023-11-26 MED ORDER — SODIUM CHLORIDE 0.9% FLUSH
10.0000 mL | Freq: Two times a day (BID) | INTRAVENOUS | Status: DC
  Administered 2023-11-26 – 2023-11-27 (×2): 10 mL

## 2023-11-26 MED ORDER — INSULIN GLARGINE-YFGN 100 UNIT/ML ~~LOC~~ SOLN
8.0000 [IU] | Freq: Every day | SUBCUTANEOUS | Status: DC
  Administered 2023-11-26 – 2023-11-27 (×2): 8 [IU] via SUBCUTANEOUS
  Filled 2023-11-26 (×2): qty 0.08

## 2023-11-26 MED ORDER — MAGNESIUM HYDROXIDE 400 MG/5ML PO SUSP
30.0000 mL | Freq: Two times a day (BID) | ORAL | Status: AC
  Administered 2023-11-26 (×2): 30 mL via ORAL
  Filled 2023-11-26 (×2): qty 30

## 2023-11-26 MED ORDER — DOCUSATE SODIUM 100 MG PO CAPS
200.0000 mg | ORAL_CAPSULE | Freq: Two times a day (BID) | ORAL | Status: DC
  Administered 2023-11-26 – 2023-11-27 (×3): 200 mg via ORAL
  Filled 2023-11-26 (×3): qty 2

## 2023-11-26 NOTE — Progress Notes (Signed)

## 2023-11-26 NOTE — Plan of Care (Signed)

## 2023-11-26 NOTE — Progress Notes (Signed)
 PROGRESS NOTE                                                                                                                                                                                                             Patient Demographics:    Kathryn [name], is a 53 y.o. female, DOB - 1970/10/28, FMW:[id number]  Outpatient Primary MD for the patient is Paseda, Folashade R, FNP    LOS - 2  Admit date - 11/23/2023    Chief Complaint  Patient presents with   Hypoglycemia    Pt took insulin  this am and states her blood sugar this am was 49. States it has been dropping since yesterday. Pt is alert and oriented. Ambulatory.        Brief Narrative (HPI from H&P)   53 y.o. female with history of diabetes mellitus type 2, hypertension, schizoaffective disorder, hypothyroidism, neuropathy presents to the ER after patient was found to be hypoglycemic.  Patient was started back on insulin  which she has not been taking for many months along with Trulicity .  Patient's hemoglobin A1c was 14 4 months ago then 10.7  a month ago.  She was on a combination of high-dose insulin  along with oral hypoglycemics, medications were being actively adjusted recently, she was admitted for hypoglycemia and placed on D10 drip.   Subjective:   Patient in bed, appears comfortable, denies any headache, no fever, no chest pain or pressure, no shortness of breath , no abdominal pain. No focal weakness.   Assessment  & Plan :   Hypoglycemia in the setting of diabetes mellitus type 2 with on combination of high-dose glipizide , high-dose long-acting insulin  and sliding scale.  Patient is glipizide  dose was increased 2 months ago and insulin  dose was adjusted recently as well, currently off of all offending medications, kept on D10 drip for 24 hours, CBGs improving, insulin  drip stopped currently on sliding scale insulin  add low-dose Semglee , question compliance with  diet at home as well, continue to monitor CBGs and titrate insulin , if stable likely discharge tomorrow.  Hypertension blood pressure slightly soft hold ARB and Norvasc , cut down Coreg  and hydrate. Schizoaffective disorder on Cogentin  Risperdal  Klonopin  duloxetine . Hypothyroid on Synthroid . Neuropathy on gabapentin . History of IBS supportive care benign abdominal exam, no symptoms. Anemia appears to be chronic follow CBC. AKI.  Hydrate.  Hold  ARB.    Lab Results  Component Value Date   HGBA1C 8.0 (H) 11/24/2023   CBG (last 3)  Recent Labs    11/26/23 0001 11/26/23 0428 11/26/23 0816  GLUCAP 149* 149* 163*         Condition - Fair  Family Communication  : Husband bedside on 11/26/2023  Code Status :  Full  Consults  :  DM educator  PUD Prophylaxis :     Procedures  :          Disposition Plan  :    Status is: Inpatient   DVT Prophylaxis  :    enoxaparin  (LOVENOX ) injection 40 mg Start: 11/24/23 0800   Lab Results  Component Value Date   PLT 290 11/26/2023    Diet :  Diet Order             Diet Carb Modified Fluid consistency: Thin; Room service appropriate? Yes  Diet effective now                    Inpatient Medications  Scheduled Meds:  atorvastatin   10 mg Oral Daily   benztropine   1 mg Oral QHS   carvedilol   3.125 mg Oral BID   dextrose   1 Tube Oral Once   DULoxetine   60 mg Oral BID   enoxaparin  (LOVENOX ) injection  40 mg Subcutaneous Q24H   gabapentin   300 mg Oral Daily   insulin  aspart  0-5 Units Subcutaneous QHS   insulin  aspart  0-9 Units Subcutaneous TID WC   insulin  glargine-yfgn  8 Units Subcutaneous Daily   levothyroxine   75 mcg Oral QAC breakfast   risperiDONE   2 mg Oral Daily   risperiDONE   6 mg Oral QHS   traZODone   200 mg Oral QHS   Continuous Infusions:  lactated ringers      PRN Meds:.acetaminophen  **OR** acetaminophen , clonazePAM , hydrALAZINE   Antibiotics  :    Anti-infectives (From admission, onward)     None         Objective:   Vitals:   11/25/23 2140 11/26/23 0000 11/26/23 0427 11/26/23 0746  BP: 125/76 128/82 99/69 109/77  Pulse: 99 96 100 100  Resp:  18 18 16   Temp:  98.7 F (37.1 C) 98.5 F (36.9 C)   TempSrc:  Oral Oral   SpO2:  99% 98% 98%  Weight:      Height:        Wt Readings from Last 3 Encounters:  11/23/23 70.3 kg  10/23/23 72.1 kg  07/27/23 75 kg     Intake/Output Summary (Last 24 hours) at 11/26/2023 0907 Last data filed at 11/25/2023 2030 Gross per 24 hour  Intake 120 ml  Output --  Net 120 ml     Physical Exam  Awake Alert, No new F.N deficits, Normal affect [place].AT,PERRAL Supple Neck, No JVD,   Symmetrical Chest wall movement, Good air movement bilaterally, CTAB RRR,No Gallops,Rubs or new Murmurs,  +ve B.Sounds, Abd Soft, No tenderness,   No Cyanosis, Clubbing or edema      Data Review:    Recent Labs  Lab 11/23/23 1400 11/24/23 0243 11/24/23 1307 11/26/23 0609  WBC 6.7 6.7 5.7 6.1  HGB 11.2* 10.8* 10.9* 10.6*  HCT 34.7* 32.8* 33.5* 31.6*  PLT 297 285 279 290  MCV 89.7 87.9 87.5 86.6  MCH 28.9 29.0 28.5 29.0  MCHC 32.3 32.9 32.5 33.5  RDW 12.8 12.5 12.6 12.4  LYMPHSABS 3.2  --   --  2.8  MONOABS 0.5  --   --  0.5  EOSABS 0.2  --   --  0.2  BASOSABS 0.0  --   --  0.0    Recent Labs  Lab 11/23/23 1400 11/24/23 0243 11/24/23 1307 11/26/23 0609  NA 137  --  138 138  K 3.9  --  4.1 4.0  CL 105  --  105 106  CO2 24  --  25 23  ANIONGAP 8  --  8 9  GLUCOSE 115*  --  128* 183*  BUN 14  --  15 23*  CREATININE 1.02* 1.04* 1.08* 1.37*  AST 19  --  19  --   ALT 24  --  22  --   ALKPHOS 60  --  62  --   BILITOT 0.9  --  0.6  --   ALBUMIN 3.8  --  3.7  --   HGBA1C  --  8.0*  --   --   MG  --   --   --  2.0  CALCIUM  8.8*  --  9.3 9.2      Recent Labs  Lab 11/23/23 1400 11/24/23 0243 11/24/23 1307 11/26/23 0609  HGBA1C  --  8.0*  --   --   MG  --   --   --  2.0  CALCIUM  8.8*  --  9.3 9.2     --------------------------------------------------------------------------------------------------------------- Lab Results  Component Value Date   CHOL 98 (L) 02/25/2023   HDL 48 02/25/2023   LDLCALC 35 02/25/2023   TRIG 71 02/25/2023   CHOLHDL 2.0 02/25/2023    Lab Results  Component Value Date   HGBA1C 8.0 (H) 11/24/2023   No results for input(s): TSH, T4TOTAL, FREET4, T3FREE, THYROIDAB in the last 72 hours. No results for input(s): VITAMINB12, FOLATE, FERRITIN, TIBC, IRON, RETICCTPCT in the last 72 hours. ------------------------------------------------------------------------------------------------------------------ Cardiac Enzymes No results for input(s): CKMB, TROPONINI, MYOGLOBIN in the last 168 hours.  Invalid input(s): CK  Micro Results No results found for this or any previous visit (from the past 240 hours).  Radiology Report No results found.    Signature  -   Lavada Stank M.D on 11/26/2023 at 9:07 AM   -  To page go to [email or web address]

## 2023-11-26 NOTE — Progress Notes (Signed)
   11/26/23 1220  Mobility  Activity Ambulated independently in hallway  Level of Assistance Independent  Assistive Device Other (Comment) (IV Pole)  Distance Ambulated (ft) 150 ft  Activity Response Tolerated fair  Mobility Referral Yes  Mobility visit 1 Mobility  Mobility Specialist Start Time (ACUTE ONLY) 1220  Mobility Specialist Stop Time (ACUTE ONLY) 1230  Mobility Specialist Time Calculation (min) (ACUTE ONLY) 10 min   Mobility Specialist: Progress Note  During Mobility: HR 120 Post-Mobility:    HR 116  Pt agreeable to mobility session - received in EOB. C/o pelvic pain rated 8/10. Returned to EOB with all needs met - call bell within reach. Husband Present.  Virgle Boards, BS Mobility Specialist Please contact via SecureChat or  Rehab office at [phone].

## 2023-11-26 NOTE — Plan of Care (Signed)
  Problem: Education: Goal: Knowledge of General Education information will improve Description: Including pain rating scale, medication(s)/side effects and non-pharmacologic comfort measures Outcome: Progressing   Problem: Health Behavior/Discharge Planning: Goal: Ability to manage health-related needs will improve Outcome: Progressing   Problem: Clinical Measurements: Goal: Ability to maintain clinical measurements within normal limits will improve Outcome: Progressing Goal: Will remain free from infection Outcome: Progressing Goal: Diagnostic test results will improve Outcome: Progressing Goal: Respiratory complications will improve Outcome: Progressing Goal: Cardiovascular complication will be avoided Outcome: Progressing   Problem: Activity: Goal: Risk for activity intolerance will decrease Outcome: Progressing   Problem: Nutrition: Goal: Adequate nutrition will be maintained Outcome: Progressing   Problem: Coping: Goal: Level of anxiety will decrease Outcome: Progressing   Problem: Elimination: Goal: Will not experience complications related to bowel motility Outcome: Progressing Goal: Will not experience complications related to urinary retention Outcome: Progressing   Problem: Pain Managment: Goal: General experience of comfort will improve and/or be controlled Outcome: Progressing   Problem: Safety: Goal: Ability to remain free from injury will improve Outcome: Progressing   Problem: Skin Integrity: Goal: Risk for impaired skin integrity will decrease Outcome: Progressing   Problem: Education: Goal: Ability to describe self-care measures that may prevent or decrease complications (Diabetes Survival Skills Education) will improve Outcome: Progressing Goal: Individualized Educational Video(s) Outcome: Progressing   Problem: Coping: Goal: Ability to adjust to condition or change in health will improve Outcome: Progressing   Problem: Fluid  Volume: Goal: Ability to maintain a balanced intake and output will improve Outcome: Progressing   Problem: Health Behavior/Discharge Planning: Goal: Ability to identify and utilize available resources and services will improve Outcome: Progressing Goal: Ability to manage health-related needs will improve Outcome: Progressing   Problem: Metabolic: Goal: Ability to maintain appropriate glucose levels will improve Outcome: Progressing   Problem: Nutritional: Goal: Maintenance of adequate nutrition will improve Outcome: Progressing Goal: Progress toward achieving an optimal weight will improve Outcome: Progressing   Problem: Skin Integrity: Goal: Risk for impaired skin integrity will decrease Outcome: Progressing   Problem: Tissue Perfusion: Goal: Adequacy of tissue perfusion will improve Outcome: Progressing   0600 Pt rested well during the shift and discharge planned for today, no issues or concerns noted during shift.

## 2023-11-27 LAB — MAGNESIUM: Magnesium: 2.4 mg/dL (ref 1.7–2.4)

## 2023-11-27 LAB — BASIC METABOLIC PANEL WITH GFR
Anion gap: 7 (ref 5–15)
BUN: 24 mg/dL — ABNORMAL HIGH (ref 6–20)
CO2: 26 mmol/L (ref 22–32)
Calcium: 8.9 mg/dL (ref 8.9–10.3)
Chloride: 105 mmol/L (ref 98–111)
Creatinine, Ser: 0.95 mg/dL (ref 0.44–1.00)
GFR, Estimated: >60 mL/min (ref >60)
Glucose, Bld: 107 mg/dL — ABNORMAL HIGH (ref 70–99)
Potassium: 3.8 mmol/L (ref 3.5–5.1)
Sodium: 138 mmol/L (ref 135–145)

## 2023-11-27 LAB — GLUCOSE, CAPILLARY
Glucose-Capillary: 119 mg/dL — ABNORMAL HIGH (ref 70–99)
Glucose-Capillary: 169 mg/dL — ABNORMAL HIGH (ref 70–99)

## 2023-11-27 MED ORDER — LOSARTAN POTASSIUM 25 MG PO TABS: 25.0000 mg | ORAL_TABLET | Freq: Every day | ORAL | 0 refills | Status: DC

## 2023-11-27 MED ORDER — INSULIN ASPART 100 UNIT/ML FLEXPEN
PEN_INJECTOR | SUBCUTANEOUS | 0 refills | Status: DC
  Filled 2023-11-27: qty 15, 50d supply, fill #0

## 2023-11-27 MED ORDER — INSULIN ASPART 100 UNIT/ML FLEXPEN: PEN_INJECTOR | SUBCUTANEOUS | 0 refills | Status: DC

## 2023-11-27 MED ORDER — PEN NEEDLES 31G X 6 MM MISC
0 refills | Status: DC
  Filled 2023-11-27: qty 100, 25d supply, fill #0

## 2023-11-27 MED ORDER — "INSULIN SYRINGE-NEEDLE U-100 25G X 1"" 1 ML MISC": 0 refills | Status: DC

## 2023-11-27 MED ORDER — BASAGLAR KWIKPEN 100 UNIT/ML ~~LOC~~ SOPN: 15.0000 [IU] | PEN_INJECTOR | Freq: Every day | SUBCUTANEOUS | Status: DC

## 2023-11-27 NOTE — Discharge Summary (Signed)
 Kathryn [name] FMW:[id number] DOB: [birth year] DOA: 11/23/2023  PCP: Paseda, Folashade R, FNP  Admit date: 11/23/2023  Discharge date: 11/27/2023  Admitted From: Home   Disposition:  [facility]   Recommendations for Outpatient Follow-up:   Follow up with PCP in 1-2 weeks  PCP Please obtain BMP/CBC, 2 view CXR in 1week,  (see Discharge instructions)   PCP Please follow up on the following pending results:    Home Health: None   Equipment/Devices: None  Consultations: None  Discharge Condition: Stable    CODE STATUS: Full    Diet Recommendation: Low Carb    Chief Complaint  Patient presents with   Hypoglycemia    Pt took insulin  this am and states her blood sugar this am was 49. States it has been dropping since yesterday. Pt is alert and oriented. Ambulatory.      Brief history of present illness from the day of admission and additional interim summary     53 y.o. female with history of diabetes mellitus type 2, hypertension, schizoaffective disorder, hypothyroidism, neuropathy presents to the ER after patient was found to be hypoglycemic.  Patient was started back on insulin  which she has not been taking for many months along with Trulicity .  Patient's hemoglobin A1c was 14 4 months ago then 10.7  a month ago.  She was on a combination of high-dose insulin  along with oral hypoglycemics, medications were being actively adjusted recently, she was admitted for hypoglycemia and placed on D10 drip.                                                                  Hospital Course   Hypoglycemia in the setting of diabetes mellitus type 2 with on combination of high-dose glipizide , high-dose long-acting insulin  and sliding scale.  Patient is glipizide  dose was increased 2 months ago and insulin  dose was adjusted recently as  well, currently off of all offending medications, kept on D10 drip for 24 hours, CBGs improving, requiring very low-dose of Semglee  along with low intensity sliding scale with stable CBGs, I question if she is compliant with her diet and medication regimen at home which is causing problems in true titration in the outpatient setting.  Has been counseled extensively, will be discharged on below dictated regimen of long-acting insulin  and sliding scale.  Oral hypoglycemics discontinued Trulicity  discontinued, long-acting insulin  cut by one fourth.  Rested to check CBGs q. [facility] maintain a logbook and follow-up with PCP within a week for further adjustment and monitoring.   Hypertension blood pressure soft here, question compliance with home medications, now on only low-dose ARB.  PCP to monitor and adjust Schizoaffective disorder on Cogentin  Risperdal  Klonopin  duloxetine . Hypothyroid on Synthroid . Neuropathy on gabapentin . History of IBS supportive care benign abdominal exam, no symptoms. Anemia appears  to be chronic follow CBC outpatient anemia workup per PCP. AKI.  Due to overmedication and hypotension, question compliance with medications, was hydrated, resolved   Discharge diagnosis     Principal Problem:   Hypoglycemia Active Problems:   Schizoaffective disorder (HCC)   Essential hypertension, benign   Chronic GERD   IBS (irritable bowel syndrome)   Hypothyroidism   Dyslipidemia, goal LDL below 70   Uncontrolled diabetes mellitus with hyperglycemia, with long-term current use of insulin  (HCC)   Moderate persistent asthma    Discharge instructions    Discharge Instructions     Discharge instructions   Complete by: As directed    Follow with Primary MD Paseda, Folashade R, FNP in 7 days   Get CBC, CMP, Magnesium  -  checked next visit with your primary MD    Activity: As tolerated with Full fall precautions use walker/cane & assistance as needed  Disposition Home   Diet: Low  carbohydrate diet.  Accuchecks 4 times/day, Once in AM empty stomach and then before each meal. Log in all results and show them to your Prim.MD in 3 days. If any glucose reading is under 80 or above 300 call your Prim MD immidiately. Follow Low glucose instructions for glucose under 80 as instructed.   Special Instructions: If you have smoked or chewed Tobacco  in the last 2 yrs please stop smoking, stop any regular Alcohol  and or any Recreational drug use.  On your next visit with your primary care physician please Get Medicines reviewed and adjusted.  Please request your Prim.MD to go over all Hospital Tests and Procedure/Radiological results at the follow up, please get all Hospital records sent to your Prim MD by signing hospital release before you go home.  If you experience worsening of your admission symptoms, develop shortness of breath, life threatening emergency, suicidal or homicidal thoughts you must seek medical attention immediately by calling 911 or calling your MD immediately  if symptoms less severe.  You Must read complete instructions/literature along with all the possible adverse reactions/side effects for all the Medicines you take and that have been prescribed to you. Take any new Medicines after you have completely understood and accpet all the possible adverse reactions/side effects.   Do not drive when taking Pain medications.  Do not take more than prescribed Pain, Sleep and Anxiety Medications  Wear Seat belts while driving.   Increase activity slowly   Complete by: As directed        Discharge Medications   Allergies as of 11/27/2023       Reactions   Metformin Hcl Diarrhea        Medication List     STOP taking these medications    amLODipine  10 MG tablet Commonly known as: NORVASC    carvedilol  12.5 MG tablet Commonly known as: COREG    glipiZIDE  10 MG tablet Commonly known as: GLUCOTROL    Trulicity  0.75 MG/0.5ML Soaj Generic drug:  Dulaglutide    Trulicity  1.5 MG/0.5ML Soaj Generic drug: Dulaglutide        TAKE these medications    albuterol  108 (90 Base) MCG/ACT inhaler Commonly known as: VENTOLIN  HFA Inhale 2 puffs into the lungs every 6 (six) hours as needed for wheezing or shortness of breath.   aspirin  EC 81 MG tablet Take 81 mg by mouth daily.   atorvastatin  10 MG tablet Commonly known as: LIPITOR Take 1 tablet (10 mg total) by mouth daily.   Basaglar  KwikPen 100 UNIT/ML Inject 15 Units into the skin  daily. May increase up to 50 units twice daily if instructed by your provider. What changed:  how much to take when to take this   benztropine  1 MG tablet Commonly known as: COGENTIN  TAKE 1 TABLET(1 MG) BY MOUTH AT BEDTIME   clonazePAM  0.5 MG tablet Commonly known as: KLONOPIN  Take 1 tablet (0.5 mg total) by mouth 2 (two) times daily as needed for anxiety.   DULoxetine  60 MG capsule Commonly known as: CYMBALTA  Take 1 capsule (60 mg total) by mouth 2 (two) times daily.   gabapentin  300 MG capsule Commonly known as: NEURONTIN  Take 1 capsule (300 mg total) by mouth daily.   insulin  aspart 100 UNIT/ML FlexPen Commonly known as: NOVOLOG  Before each meal 3 times a day, 140-199 - 2 units, 200-250 - 4 units, 251-299 - 6 units,  300-349 - 8 units,  350 or above 10 units.   Insulin  Syringe-Needle U-100 25G X 1 1 ML Misc For 4 times a day insulin  SQ, 1 month supply. Diagnosis E11.65   levothyroxine  75 MCG tablet Commonly known as: SYNTHROID  Take 1 tablet (75 mcg total) by mouth daily before breakfast.   losartan  25 MG tablet Commonly known as: COZAAR  Take 1 tablet (25 mg total) by mouth daily. What changed:  medication strength how much to take   multivitamin tablet Take 1 tablet by mouth daily.   omeprazole  20 MG capsule Commonly known as: PRILOSEC Take 20 mg by mouth daily.   risperiDONE  2 MG tablet Commonly known as: RISPERDAL  TAKE 1 TABLET EVERY MORNING AND 3 TABLETS AT BEDTIME    rizatriptan  10 MG tablet Commonly known as: MAXALT  Take 1 tablet (10 mg total) by mouth as needed for migraine. May repeat in 2 hours if needed   topiramate  100 MG tablet Commonly known as: TOPAMAX  TAKE 1 TABLET BY MOUTH EVERYDAY AT BEDTIME   traZODone  100 MG tablet Commonly known as: DESYREL  TAKE 2 TABLETS(200 MG) BY MOUTH AT BEDTIME   True Metrix Blood Glucose Test test strip Generic drug: glucose blood Use as instructed to monitor blood sugar twice daily   True Metrix Meter w/Device Kit Use as directed to monitor blood sugar.   TRUEplus Lancets 28G Misc Use as directed to monitor blood sugar twice daily   Vitamin D -3 125 MCG (5000 UT) Tabs Take 2 tablets by mouth daily.         Follow-up Information     Paseda, Folashade R, FNP. Schedule an appointment as soon as possible for a visit in 1 week(s).   Specialty: Nurse Practitioner Contact information: [address] [place] [address] [place], [place] [address] [phone]                 Major procedures and Radiology Reports - PLEASE review detailed and final reports thoroughly  -      DG Abd 1 View Result Date: 11/24/2023 CLINICAL DATA:  Nausea and vomiting EXAM: ABDOMEN - 1 VIEW COMPARISON:  None Available. FINDINGS: Nonobstructive bowel gas pattern. Moderate colonic stool burden. Cholecystectomy. Stones in the medial left abdomen compatible with phleboliths as seen on CT 09/06/2015. IMPRESSION: Nonobstructive bowel gas pattern. Moderate colonic stool burden. Electronically Signed   By: Norman Gatlin M.D.   On: 11/24/2023 02:41   DG Chest 2 View Result Date: 11/23/2023 CLINICAL DATA:  Midline chest pain. EXAM: CHEST - 2 VIEW COMPARISON:  February 16, 2017 FINDINGS: The heart size and mediastinal contours are within normal limits. Both lungs are clear. Radiopaque surgical clips are seen within  the right upper quadrant. The visualized skeletal structures are unremarkable. IMPRESSION: No active cardiopulmonary  disease. Electronically Signed   By: Suzen Dials M.D.   On: 11/23/2023 17:42    Micro Results    No results found for this or any previous visit (from the past 240 hours).  Today   Subjective    Evanna Korn today has no headache,no chest abdominal pain,no new weakness tingling or numbness, feels much better wants to go home today.   Objective   Blood pressure 98/73, pulse 94, temperature 98 F (36.7 C), temperature source Axillary, resp. rate 16, height 5' (1.524 m), weight 70.3 kg, last menstrual period 01/23/2013, SpO2 96%.  No intake or output data in the 24 hours ending 11/27/23 0943  Exam  Awake Alert, No new F.N deficits,    [place].AT,PERRAL Supple Neck,   Symmetrical Chest wall movement, Good air movement bilaterally, CTAB RRR,No Gallops,   +ve B.Sounds, Abd Soft, Non tender,  No Cyanosis, Clubbing or edema    Data Review   Recent Labs  Lab 11/23/23 1400 11/24/23 0243 11/24/23 1307 11/26/23 0609  WBC 6.7 6.7 5.7 6.1  HGB 11.2* 10.8* 10.9* 10.6*  HCT 34.7* 32.8* 33.5* 31.6*  PLT 297 285 279 290  MCV 89.7 87.9 87.5 86.6  MCH 28.9 29.0 28.5 29.0  MCHC 32.3 32.9 32.5 33.5  RDW 12.8 12.5 12.6 12.4  LYMPHSABS 3.2  --   --  2.8  MONOABS 0.5  --   --  0.5  EOSABS 0.2  --   --  0.2  BASOSABS 0.0  --   --  0.0    Recent Labs  Lab 11/23/23 1400 11/24/23 0243 11/24/23 1307 11/26/23 0609 11/27/23 0141  NA 137  --  138 138 138  K 3.9  --  4.1 4.0 3.8  CL 105  --  105 106 105  CO2 24  --  25 23 26   ANIONGAP 8  --  8 9 7   GLUCOSE 115*  --  128* 183* 107*  BUN 14  --  15 23* 24*  CREATININE 1.02* 1.04* 1.08* 1.37* 0.95  AST 19  --  19  --   --   ALT 24  --  22  --   --   ALKPHOS 60  --  62  --   --   BILITOT 0.9  --  0.6  --   --   ALBUMIN 3.8  --  3.7  --   --   HGBA1C  --  8.0*  --   --   --   MG  --   --   --  2.0 2.4  CALCIUM  8.8*  --  9.3 9.2 8.9    Total Time in preparing paper work, data evaluation and todays exam - 35  minutes  Signature  -    Lavada Stank M.D on 11/27/2023 at 9:43 AM   -  To page go to [email or web address]

## 2023-11-27 NOTE — TOC Transition Note (Signed)
 Transition of Care [facility]) - Discharge Note   Patient Details  Name: Kathryn [name] MRN: [id number] Date of Birth: 1970/08/05  Transition of Care [facility] CM/SW Contact:  Corean JAYSON Canary, RN Phone Number: 11/27/2023, 9:36 AM   Clinical Narrative:     Patient will transition home today, blood sugar stable. No needs identified, TOC will sign off  Final next level of care: Home/Self Care Barriers to Discharge: No Barriers Identified   Patient Goals and CMS Choice            Discharge Placement                       Discharge Plan and Services Additional resources added to the After Visit Summary for                                       Social Drivers of Health (SDOH) Interventions SDOH Screenings   Food Insecurity: No Food Insecurity (11/24/2023)  Housing: Unknown (11/24/2023)  Transportation Needs: No Transportation Needs (11/24/2023)  Utilities: Not At Risk (11/24/2023)  Depression (PHQ2-9): High Risk (10/23/2023)  Social Connections: Socially Isolated (11/24/2023)  Tobacco Use: Low Risk  (11/24/2023)     Readmission Risk Interventions     No data to display

## 2023-11-27 NOTE — Care Management Important Message (Signed)
 Important Message  Patient Details  Name: Kathryn [name] MRN: [id number] Date of Birth: 1971/02/16   Important Message Given:  Yes - Medicare IM     Claretta Deed 11/27/2023, 9:26 AM

## 2023-11-27 NOTE — Discharge Instructions (Signed)
 Follow with Primary MD Paseda, Folashade R, FNP in 7 days   Get CBC, CMP, Magnesium  -  checked next visit with your primary MD    Activity: As tolerated with Full fall precautions use walker/cane & assistance as needed  Disposition Home   Diet: Low carbohydrate diet.  Accuchecks 4 times/day, Once in AM empty stomach and then before each meal. Log in all results and show them to your Prim.MD in 3 days. If any glucose reading is under 80 or above 300 call your Prim MD immidiately. Follow Low glucose instructions for glucose under 80 as instructed.   Special Instructions: If you have smoked or chewed Tobacco  in the last 2 yrs please stop smoking, stop any regular Alcohol  and or any Recreational drug use.  On your next visit with your primary care physician please Get Medicines reviewed and adjusted.  Please request your Prim.MD to go over all Hospital Tests and Procedure/Radiological results at the follow up, please get all Hospital records sent to your Prim MD by signing hospital release before you go home.  If you experience worsening of your admission symptoms, develop shortness of breath, life threatening emergency, suicidal or homicidal thoughts you must seek medical attention immediately by calling 911 or calling your MD immediately  if symptoms less severe.  You Must read complete instructions/literature along with all the possible adverse reactions/side effects for all the Medicines you take and that have been prescribed to you. Take any new Medicines after you have completely understood and accpet all the possible adverse reactions/side effects.   Do not drive when taking Pain medications.  Do not take more than prescribed Pain, Sleep and Anxiety Medications  Wear Seat belts while driving.

## 2023-11-30 ENCOUNTER — Telehealth: Payer: Self-pay

## 2023-11-30 ENCOUNTER — Other Ambulatory Visit: Payer: Self-pay

## 2023-11-30 NOTE — Transitions of Care (Post Inpatient/ED Visit) (Signed)
   11/30/2023  Name: ATLEY SCARBORO MRN: [id number] DOB: [birth year]  Today's TOC FU Call Status: Today's TOC FU Call Status:: Unsuccessful Call (1st Attempt) Unsuccessful Call (1st Attempt) Date: 11/30/23  Attempted to reach the patient regarding the most recent Inpatient/ED visit.  Follow Up Plan: Additional outreach attempts will be made to reach the patient to complete the Transitions of Care (Post Inpatient/ED visit) call.   Arvin Seip RN, BSN, CCM [employer], Population Health Case Manager Phone: [phone]

## 2023-12-01 ENCOUNTER — Telehealth: Payer: Self-pay

## 2023-12-01 NOTE — Transitions of Care (Post Inpatient/ED Visit) (Signed)
   12/01/2023  Name: [name] MRN: [id number] DOB: [birth year]  Today's TOC FU Call Status: Today's TOC FU Call Status:: Unsuccessful Call (2nd Attempt) Unsuccessful Call (2nd Attempt) Date: 12/01/23  Attempted to reach the patient regarding the most recent Inpatient/ED visit.  Follow Up Plan: Additional outreach attempts will be made to reach the patient to complete the Transitions of Care (Post Inpatient/ED visit) call.   Arvin Seip RN, BSN, CCM [employer], Population Health Case Manager Phone: [phone]

## 2023-12-01 NOTE — Telephone Encounter (Signed)
 Copied from CRM [id number]. Topic: Clinical - Medication Question >> Dec 01, 2023  9:46 AM Treva T wrote: Reason for CRM: Received call from Marinda, with [facility], calling to check status of form faxed on 11/25/23, and today 12/01/23, requesting new prescriptions for all active medications for patient, as they are now the preferred patient pharmacy.  Marinda, is requesting a call back at [phone] with requested information, or a return fax at [phone].   Thank you.

## 2023-12-03 ENCOUNTER — Telehealth: Payer: Self-pay

## 2023-12-03 DIAGNOSIS — Z139 Encounter for screening, unspecified: Secondary | ICD-10-CM

## 2023-12-03 DIAGNOSIS — F419 Anxiety disorder, unspecified: Secondary | ICD-10-CM

## 2023-12-03 NOTE — Patient Instructions (Signed)
 Visit Information  Thank you for taking time to visit with me today. Please don't hesitate to contact me if I can be of assistance to you before our next scheduled telephone appointment.  Our next appointment is by telephone on 12/11/23 at 2 pm  Following is a copy of your care plan:   Goals Addressed             This Visit's Progress    [facility] Transitions of Care (TOC) Care Plan       Problems:  Recent Hospitalization for treatment of hypoglycemia Knowledge Deficit Related to diabetes management, anxiety and limited resources /lack of medical insurance.   Goal:  Over the next 30 days, the patient will not experience hospital readmission  Interventions:   Diabetes Interventions: Assessed patient's understanding of A1c goal: <7% Provided education to patient about basic DM disease process Reviewed medications with patient and discussed importance of medication adherence Discussed plans with patient for ongoing care management follow up and provided patient with direct contact information for care management team Provided patient with written educational materials related to hypo and hyperglycemia and importance of correct treatment Reviewed scheduled/upcoming provider appointments including:   Referral made to social work team for assistance with obtaining new psychiatrist, lack of medical insurance and increase in anxiety symptoms due to family dynamics Referral made to community resources care guide team for assistance with utilities Assessed social determinant of health barriers Message sent to patients primary care provider regarding need for refill for her clonazepam  and cogentin .  Lab Results  Component Value Date   HGBA1C 8.0 (H) 11/24/2023    Patient Self Care Activities:  Attend all scheduled provider appointments Call pharmacy for medication refills 3-7 days in advance of running out of medications Call provider office for new concerns or questions  Notify RN Care  Manager of [facility] call rescheduling needs Take medications as prescribed   check blood sugar at prescribed times: 4 times daily and when you have symptoms of low or high blood sugar  Plan:  Telephone follow up appointment with care management team member scheduled for:  12/11/23 at 2 pm        Patient verbalizes understanding of instructions and care plan provided today and agrees to view in MyChart. Active MyChart status and patient understanding of how to access instructions and care plan via MyChart confirmed with patient.     The patient has been provided with contact information for the care management team and has been advised to call with any health related questions or concerns.   Please call the care guide team at [phone] if you need to cancel or reschedule your appointment.   Please call the Suicide and Crisis Lifeline: 988 call 1-800-273-TALK (toll free, 24 hour hotline) if you are experiencing a Mental Health or Behavioral Health Crisis or need someone to talk to.  Arvin Seip RN, BSN, CCM [employer], Population Health Case Manager Phone: [phone]

## 2023-12-03 NOTE — Transitions of Care (Post Inpatient/ED Visit) (Signed)
 12/03/2023  Name: Kathryn [name] MRN: [id number] DOB: October 30, 1970  Today's TOC FU Call Status: Today's TOC FU Call Status:: Successful TOC FU Call Completed TOC FU Call Complete Date: 12/03/23 Patient's Name and Date of Birth confirmed.  Transition Care Management Follow-up Telephone Call Date of Discharge: 11/27/23 Discharge Facility: [facility] [place]) Type of Discharge: Inpatient Admission Primary Inpatient Discharge Diagnosis:: Hypoglycemia How have you been since you were released from the hospital?:  (Patient reports she is doing better however still having occasional blood sugars ranging 70's to 90's with hypoglycemic symptoms. Patient reports treating symptoms with gingerale or coke.) Any questions or concerns?: Yes Patient Questions/Concerns:: Patient asked how to manager her blood sugar when it ranges in the 90's and she is still having symptoms. Patient Questions/Concerns Addressed:  (Education article on Hypoglycemic symptoms mailed to patient.)  Items Reviewed: Did you receive and understand the discharge instructions provided?:  (discharge instructions reviewed with patient.) Medications obtained,verified, and reconciled?: Yes (Medications Reviewed) Any new allergies since your discharge?: No Dietary orders reviewed?: Yes Type of Diet Ordered:: Low carbohydrate diet Do you have support at home?: Yes People in Home [RPT]: spouse Name of Support/Comfort Primary Source: [name] Din  Medications Reviewed Today: Medications Reviewed Today     Reviewed by Yariah Selvey E, RN (Registered Nurse) on 12/03/23 at 1413  Med List Status: <None>   Medication Order Taking? Sig Documenting Provider Last Dose Status Informant  albuterol  (VENTOLIN  HFA) 108 (90 Base) MCG/ACT inhaler [id number] Yes Inhale 2 puffs into the lungs every 6 (six) hours as needed for wheezing or shortness of breath. Zarwolo, Gloria, FNP  Active Self, Pharmacy Records  aspirin  EC 81 MG tablet [id number] Yes Take  81 mg by mouth daily. [provider]  Active Self, Pharmacy Records  atorvastatin  (LIPITOR) 10 MG tablet [id number] Yes Take 1 tablet (10 mg total) by mouth daily. Paseda, Folashade R, FNP  Active Self, Pharmacy Records  benztropine  (COGENTIN ) 1 MG tablet [id number] Yes TAKE 1 TABLET(1 MG) BY MOUTH AT BEDTIME Paseda, Folashade R, FNP  Active Self, Pharmacy Records  Blood Glucose Monitoring Suppl (TRUE METRIX METER) w/Device KIT [id number]  Use as directed to monitor blood sugar. Paseda, Folashade R, FNP  Active Self, Pharmacy Records  Cholecalciferol (VITAMIN D -3) 125 MCG (5000 UT) TABS [id number] Yes Take 2 tablets by mouth daily. [name], Gebreselassie W, MD  Active Self, Pharmacy Records  clonazePAM  (KLONOPIN ) 0.5 MG tablet [id number] Yes Take 1 tablet (0.5 mg total) by mouth 2 (two) times daily as needed for anxiety. Okey Barnie SAUNDERS, MD  Active Self, Pharmacy Records           Med Note LEOBARDO, NICOLE   Tue Nov 24, 2023 12:16 AM) Need refill  DULoxetine  (CYMBALTA ) 60 MG capsule [id number] Yes Take 1 capsule (60 mg total) by mouth 2 (two) times daily. Paseda, Folashade R, FNP  Active Self, Pharmacy Records  gabapentin  (NEURONTIN ) 300 MG capsule [id number] Yes Take 1 capsule (300 mg total) by mouth daily. Paseda, Folashade R, FNP  Active Self, Pharmacy Records  glucose blood (TRUE METRIX BLOOD GLUCOSE TEST) test strip [id number]  Use as instructed to monitor blood sugar twice daily Paseda, Folashade R, FNP  Active Self, Pharmacy Records  insulin  aspart (NOVOLOG ) 100 UNIT/ML FlexPen [id number] Yes Before each meal 3 times a day, 140-199 - 2 units, 200-250 - 4 units, 251-299 - 6 units,  300-349 - 8 units,  350 or above 10 units. Singh, Prashant K, MD  Active  Insulin  Glargine (BASAGLAR  KWIKPEN) 100 UNIT/ML [id number] Yes Inject 15 Units into the skin daily. May increase up to 50 units twice daily if instructed by your provider. Singh, Prashant K, MD  Active   Insulin  Pen Needle (PEN NEEDLES) 31G X 6 MM MISC  [id number]  For 4 times a day insulin , 1 month supply. Diagnosis E11.65 Singh, Prashant K, MD  Active   levothyroxine  (SYNTHROID ) 75 MCG tablet [id number] Yes Take 1 tablet (75 mcg total) by mouth daily before breakfast. Paseda, Folashade R, FNP  Active Self, Pharmacy Records  losartan  (COZAAR ) 25 MG tablet [id number] Yes Take 1 tablet (25 mg total) by mouth daily. Singh, Prashant K, MD  Active   Multiple Vitamin (MULTIVITAMIN) tablet [id number] Yes Take 1 tablet by mouth daily. [provider]  Active Self, Pharmacy Records  omeprazole  (PRILOSEC) 20 MG capsule [id number] Yes Take 20 mg by mouth daily. [provider]  Active   risperiDONE  (RISPERDAL ) 2 MG tablet [id number] Yes TAKE 1 TABLET EVERY MORNING AND 3 TABLETS AT BEDTIME Okey Barnie SAUNDERS, MD  Active Self, Pharmacy Records  rizatriptan  (MAXALT ) 10 MG tablet [id number] Yes Take 1 tablet (10 mg total) by mouth as needed for migraine. May repeat in 2 hours if needed Paseda, Folashade R, FNP  Active Self, Pharmacy Records  topiramate  (TOPAMAX ) 100 MG tablet [id number] Yes TAKE 1 TABLET BY MOUTH EVERYDAY AT BEDTIME Paseda, Folashade R, FNP  Active Self, Pharmacy Records  traZODone  (DESYREL ) 100 MG tablet [id number] Yes TAKE 2 TABLETS(200 MG) BY MOUTH AT BEDTIME Okey Barnie SAUNDERS, MD  Active Self, Pharmacy Records  TRUEplus Lancets 30G MISC [id number]  Use as directed to monitor blood sugar twice daily Paseda, Folashade R, FNP  Active Self, Pharmacy Records            Home Care and Equipment/Supplies: Were Home Health Services Ordered?: No Any new equipment or medical supplies ordered?: No  Functional Questionnaire: Do you need assistance with bathing/showering or dressing?: No Do you need assistance with meal preparation?: No Do you need assistance with eating?: No Do you have difficulty maintaining continence: No Do you need assistance with getting out of bed/getting out of a chair/moving?: No Do you have difficulty managing or  taking your medications?: No  Follow up appointments reviewed: PCP Follow-up appointment confirmed?: Yes Date of PCP follow-up appointment?: 12/11/23 Follow-up Provider: Folashade Paseda, FNP Specialist Hospital Follow-up appointment confirmed?: NA Do you need transportation to your follow-up appointment?: No Do you understand care options if your condition(s) worsen?: Yes-patient verbalized understanding  SDOH Interventions Today    Flowsheet Row Most Recent Value  SDOH Interventions   Food Insecurity Interventions Intervention Not Indicated  Housing Interventions Intervention Not Indicated  Transportation Interventions Intervention Not Indicated  Utilities Interventions Other (Comment)  [referred to community care guide for utility resource/information]    Goals Addressed             This Visit's Progress    VBCI Transitions of Care (TOC) Care Plan       Problems:  Recent Hospitalization for treatment of hypoglycemia Knowledge Deficit Related to diabetes management, anxiety and limited resources /lack of medical insurance.   Goal:  Over the next 30 days, the patient will not experience hospital readmission  Interventions:   Diabetes Interventions: Assessed patient's understanding of A1c goal: <7% Provided education to patient about basic DM disease process Reviewed medications with patient and discussed importance of medication adherence Discussed plans with patient for ongoing care management follow up and  provided patient with direct contact information for care management team Provided patient with written educational materials related to hypo and hyperglycemia and importance of correct treatment Reviewed scheduled/upcoming provider appointments including:   Referral made to social work team for assistance with obtaining new psychiatrist, lack of medical insurance and increase in anxiety symptoms due to family dynamics Referral made to community resources care guide  team for assistance with utilities Assessed social determinant of health barriers Message sent to patients primary care provider regarding need for refill for her clonazepam  and cogentin .  Lab Results  Component Value Date   HGBA1C 8.0 (H) 11/24/2023    Patient Self Care Activities:  Attend all scheduled provider appointments Call pharmacy for medication refills 3-7 days in advance of running out of medications Call provider office for new concerns or questions  Notify RN Care Manager of TOC call rescheduling needs Take medications as prescribed   check blood sugar at prescribed times: 4 times daily and when you have symptoms of low or high blood sugar  Plan:  Telephone follow up appointment with care management team member scheduled for:  12/11/23 at 2 pm        Arvin Seip RN, BSN, CCM [place]  [facility], Population Health Case Manager Phone: [phone]

## 2023-12-04 ENCOUNTER — Other Ambulatory Visit: Payer: Self-pay

## 2023-12-04 MED ORDER — LOSARTAN POTASSIUM 100 MG PO TABS
100.0000 mg | ORAL_TABLET | Freq: Every day | ORAL | 3 refills | Status: AC
  Filled 2023-12-04 – 2024-02-19 (×2): qty 90, 90d supply, fill #0
  Filled 2024-06-16: qty 90, 90d supply, fill #1

## 2023-12-04 NOTE — Progress Notes (Signed)
 12/04/2023 Name: Kathryn [name] MRN: [id number] DOB: 20-Aug-1970  Chief Complaint  Patient presents with   Diabetes   Hypertension   Hyperlipidemia    Kathryn [name] is a 53 y.o. year old female who presented for a telephone visit.   They were referred to the pharmacist by their PCP for assistance in managing diabetes. PMH includes HTN, asthma, IBS, dysphagia, gastroparesis with abnormal esophagram, T2DM with neuropathy, PTSD, schizoaffective disorder, BMI > 30.    Subjective: Patient was last seen by PCP, Kathryn Shall, NP on 10/23/23. She reported taking glipizide  10 mg BID. She was not taking Jardiance  due to cost, and report she had not taken Tresiba  or Trulicity  in months. She reported trying to follow a low-carb diet and avoid soda, but still drinks sweet tea. She was instructed to restart Tresiba  80 units daily and Trulicity .  At pharmacy telephone appointment on 11/06/23, patient reported that she picked up Tresiba  from the pharmacy but it was > $200. She was not able to pick up Trulicity . She reported that she does not currently have insurance because open-enrollment at her job does not open until the fall. She has applied to Medicaid in the past, but reports it was > 2 years ago. She continued to report s/sx of hyperglycemia but did not have a way to monitor her BG. She was instructed to split her dose of Tresiba  for better absorption, continue glipizide , and start Trulicity  (via [facility] supply). She was also instructed to start monitoring her BG and was provided a prescription for the True Metrix meter.   Unfortunately, she was admitted from 11/23/23 to 11/27/23 for recurrent hypoglycemia (BG 49-63 mg/dL despite treatment with OJ). At discharge she was instructed to discontinue glipizide  and Trulicity , and take Basaglar  15 units daily and Novolog  sliding scale 3 times daily before meals. Her BP was well controlled inpatient on ARB monotherapy, so she was also instructed to stop amlodipine   and carvedilol , and continue losartan  25 mg daily. During her TOC follow-up call, it was identified that she has been dismissed from her psychiatry provider. Her GAD-7 was 21 and patient reported being out of her benztropine  and had 3 days remaining of clonazepam .   Today, patient reports that she is doing ok. She is taking her Basaglar  and Novolog  as prescribed and regularly checking her BG. She discontinued amlodipine  and carvedilol  as instructed, but is still taking losartan  100 mg daily instead of 25 mg daily. She reports needing refills of her benztropine  and clonazepam , which she would like to fill at [facility].    Care Team: Primary Care Provider: Paseda, Folashade R, FNP ; Next Scheduled Visit: 12/11/23 hospital follow-up Behavioral Health: Kathryn Gull, MD: Missed appointment on 08/28/23:reports that she was dismissed from clinic. SW is going to assist her in finding a new provider.    Medication Access/Adherence  Current Pharmacy:  [facility] [address], [address] [place] [address] Phone: [phone] Fax: [phone]   Patient reports affordability concerns with their medications: Yes - has no insurance until open enrollment with her job until October.  Patient reports access/transportation concerns to their pharmacy: No  Patient reports adherence concerns with their medications:  Yes  - patient has a history of stopping medications without reaching out to troubleshoot cost/adherence issues. Patient states she is taking certain medications, then later says she does not have the medication.    Diabetes:  Current medications: Basaglar  15 units once daily, Novolog   before each meal 3 times a day per sliding scale (140-199 - 2 units, 200-250 - 4 units, 251-299 - 6 units, 300-349 - 8 units, 350 or above 10 units)  Medications tried in the past: metformin - intolerance, restarted Trulicity  0.75 mg on 11/09/23 - has at home still.    Current glucose readings:  Using True Metrix meter - checking 3 times daily: 79, 92 11/28/23: 10AM 133, 1PM 133, 7PM 181 11/29/23: 7AM 137, 12PM 142, 8PM 204 11/30/23: 7AM 118, 12PM 167, 7PM 140 12/01/23: 8AM 123, 1PM 176, 9PM 92 12/02/23: 8AM 79, 1PM 109, 10PM 91 - felt dizzy, nauseous, shaky when she woke up this morning. 12/03/23: 8AM 98, 12PM 94, 6PM 103 12/04/23: 8AM 90  Previously used Dexcom G7 when she had insurance  Patient endorses hypoglycemic s/sx including dizziness, shakiness, sweating with FBG of 79 mg/dL 2 days ago.  Patient denies hyperglycemic symptoms including nocturia, polyuria, polydipsia, polyphagia, nocturia, blurred vision, neuropathy.  Current meal patterns: Reports she is following a carb-modified diet at home  - Drinks: reports she has been drinking mostly water  during the day, she has had black tea with a little bit of honey, green juice. No sodas or sweet tea.  Current physical activity: walking at work  Current medication access support: Enrolled in [employer]. Patient is willing to reapply for Medicaid.  Hypertension:  Current medications: losartan  100 mg daily (prescribed losartan  25 mg daily at discharge, but she is still taking her old supply) Medications previously tried: lisinopril , amlodipine  10 mg and carvedilol  12.5 mg BID were recently discontinued during 11/23/23 hospitalization - she put remaining supply to the side at home.   Home BP (since discharge): 113/83 mmHg recalled from memory  Hyperlipidemia/ASCVD Risk Reduction  Current lipid lowering medications: atorvastatin  10 mg daily Medications tried in the past: simvastatin  Antiplatelet regimen: aspirin  81 mg on med list - did not verify if she is taking  ASCVD History: none. Normal echo (2018) and stress test (2022) Family History: Heart disease and aneurysm (brain) in Mother Risk Factors: T2DM, HTN  Clinical ASCVD: No  The ASCVD Risk score (Arnett DK, et al., 2019) failed to calculate for the  following reasons:   The valid total cholesterol range is 130 to 320 mg/dL    Objective:  BP Readings from Last 3 Encounters:  11/27/23 98/73  10/23/23 (!) 140/88  07/27/23 124/75   Lab Results  Component Value Date   HGBA1C 8.0 (H) 11/24/2023   HGBA1C 10.7 (A) 10/23/2023   HGBA1C 14.4 (A) 07/24/2023    Lab Results  Component Value Date   CREATININE 0.95 11/27/2023   BUN 24 (H) 11/27/2023   NA 138 11/27/2023   K 3.8 11/27/2023   CL 105 11/27/2023   CO2 26 11/27/2023    Lab Results  Component Value Date   CHOL 98 (L) 02/25/2023   HDL 48 02/25/2023   LDLCALC 35 02/25/2023   TRIG 71 02/25/2023   CHOLHDL 2.0 02/25/2023    Medications Reviewed Today     Reviewed by Brinda Lorain SQUIBB, RPH (Pharmacist) on 12/04/23 at 0934  Med List Status: <None>   Medication Order Taking? Sig Documenting Provider Last Dose Status Informant  albuterol  (VENTOLIN  HFA) 108 (90 Base) MCG/ACT inhaler [id number]  Inhale 2 puffs into the lungs every 6 (six) hours as needed for wheezing or shortness of breath. Zarwolo, Gloria, FNP  Active Self, Pharmacy Records  aspirin  EC 81 MG tablet [id number] Yes Take 81 mg by mouth daily. [provider]  Active Self, Pharmacy Records  atorvastatin  (LIPITOR) 10 MG tablet [id number] Yes Take 1 tablet (10 mg total) by mouth daily. Paseda, Folashade R, FNP  Active Self, Pharmacy Records  benztropine  (COGENTIN ) 1 MG tablet [id number]  TAKE 1 TABLET(1 MG) BY MOUTH AT BEDTIME  Patient not taking: Reported on 12/04/2023   Paseda, Folashade R, FNP  Active Self, Pharmacy Records  Blood Glucose Monitoring Suppl (TRUE METRIX METER) w/Device KIT [id number]  Use as directed to monitor blood sugar. Paseda, Folashade R, FNP  Active Self, Pharmacy Records  Cholecalciferol (VITAMIN D -3) 125 MCG (5000 UT) TABS [id number]  Take 2 tablets by mouth daily. [name], Gebreselassie W, MD  Active Self, Pharmacy Records  clonazePAM  (KLONOPIN ) 0.5 MG tablet [id number]  Take 1 tablet (0.5 mg  total) by mouth 2 (two) times daily as needed for anxiety. Okey Kathryn SAUNDERS, MD  Active Self, Pharmacy Records           Med Note LEOBARDO, NICOLE   Tue Nov 24, 2023 12:16 AM) Need refill  DULoxetine  (CYMBALTA ) 60 MG capsule [id number] Yes Take 1 capsule (60 mg total) by mouth 2 (two) times daily. Paseda, Folashade R, FNP  Active Self, Pharmacy Records  gabapentin  (NEURONTIN ) 300 MG capsule [id number] Yes Take 1 capsule (300 mg total) by mouth daily. Paseda, Folashade R, FNP  Active Self, Pharmacy Records  glucose blood (TRUE METRIX BLOOD GLUCOSE TEST) test strip [id number]  Use as instructed to monitor blood sugar twice daily Paseda, Folashade R, FNP  Active Self, Pharmacy Records  insulin  aspart (NOVOLOG ) 100 UNIT/ML FlexPen [id number] Yes Before each meal 3 times a day, 140-199 - 2 units, 200-250 - 4 units, 251-299 - 6 units,  300-349 - 8 units,  350 or above 10 units. Singh, Prashant K, MD  Active   Insulin  Glargine (BASAGLAR  [facility]) 100 UNIT/ML [id number] Yes Inject 15 Units into the skin daily. May increase up to 50 units twice daily if instructed by your provider. Singh, Prashant K, MD  Active   Insulin  Pen Needle (PEN NEEDLES) 31G X 6 MM MISC [id number]  For 4 times a day insulin , 1 month supply. Diagnosis E11.65 Dennise Lavada POUR, MD  Active   levothyroxine  (SYNTHROID ) 75 MCG tablet [id number] Yes Take 1 tablet (75 mcg total) by mouth daily before breakfast. Paseda, Folashade R, FNP  Active Self, Pharmacy Records  linaclotide  (LINZESS ) 290 MCG CAPS capsule [id number]  Take 290 mcg by mouth daily before breakfast.  Patient not taking: Reported on 12/04/2023   [provider]  Active   losartan  (COZAAR ) 25 MG tablet [id number]  Take 1 tablet (25 mg total) by mouth daily. Singh, Prashant K, MD  Active   Multiple Vitamin (MULTIVITAMIN) tablet [id number]  Take 1 tablet by mouth daily. [provider]  Active Self, Pharmacy Records  omeprazole  (PRILOSEC) 20 MG capsule [id number]  Take 20 mg by  mouth daily. [provider]  Consider Medication Status and Discontinue (Patient has not taken in last 30 days)   risperiDONE  (RISPERDAL ) 2 MG tablet [id number] Yes TAKE 1 TABLET EVERY MORNING AND 3 TABLETS AT BEDTIME Okey Kathryn SAUNDERS, MD  Active Self, Pharmacy Records  rizatriptan  (MAXALT ) 10 MG tablet [id number]  Take 1 tablet (10 mg total) by mouth as needed for migraine. May repeat in 2 hours if needed Paseda, Folashade R, FNP  Active Self, Pharmacy Records  topiramate  (TOPAMAX ) 100 MG tablet [id number] Yes TAKE 1 TABLET BY MOUTH EVERYDAY AT BEDTIME Paseda, Folashade R, FNP  Active  Self, Pharmacy Records  traZODone  (DESYREL ) 100 MG tablet [id number] Yes TAKE 2 TABLETS(200 MG) BY MOUTH AT BEDTIME Okey Kathryn SAUNDERS, MD  Active Self, Pharmacy Records  TRUEplus Lancets 30G MISC [id number]  Use as directed to monitor blood sugar twice daily Paseda, Folashade R, FNP  Active Self, Pharmacy Records              Assessment/Plan:   Diabetes: - Currently uncontrolled with last A1C of 8.0% above goal < 7%, but improved from 14.4% in Feb 2025. However, this improvement in A1C is in the setting of recent hospitalization for recurrent level 2 hypoglycemia. Since discharge, her condition is much improved and she has not had any BG < 70  mg/dL, and she is needing meal-time Novolog  per sliding scale very infrequently. Discussed making slight reduction in basal insulin  given s/sx of relative hypoglycemia, but patient feels comfortable staying on her current regimen. Eventually, would like to reduce insulin  and restart GLP-1RA Trulicity  - as sliding scale prandial insulin  is not a preferred regimen.  - Reviewed long term cardiovascular and renal outcomes of uncontrolled blood sugar - Reviewed goal A1c, goal fasting, and goal 2 hour post prandial glucose - Reviewed dietary modifications including reducing portion size of carbohydrate foods, increasing protein, increasing intake of non-starchy vegetables. -  Reviewed lifestyle modifications including: increasing physical activity - Recommend to contniue Basaglar  15 untis daily - Recommend to continue Novolog  3 times daily before meals per sliding scale (140-199 - 2 units, 200-250 - 4 units, 251-299 - 6 units, 300-349 - 8 units, 350 or above 10 units) - Patient denies personal or family history of multiple endocrine neoplasia type 2, medullary thyroid  cancer; personal history of pancreatitis or gallbladder disease. - Recommend to check glucose TID before meals (True Metrix meter and supplies for affordability) - Enrolled in [facility] program at [facility] through June 2026. Patient was reeducated on applying for Medicaid at [facility] office.     Hypertension: - Currently controlled with home BP at goal < 130/80 mmHg on losartan  100 mg daily alone. Appears that adherence has improved. Scr and K were stable on last BMP during hospitalization.  - Reviewed long term cardiovascular and renal outcomes of uncontrolled blood pressure - Reviewed appropriate blood pressure monitoring technique and reviewed goal blood pressure. Recommended to check home blood pressure and heart rate once daily - Recommend to continue losartan  100 mg daily. Will collaborate with PCP to send updated prescription to [facility].    Hyperlipidemia/ASCVD Risk Reduction: - Currently controlled with LDL-C of 35 mg/dL below goal < 70 mg/dL on atorvastatin  10 mg daily. Recommend to continue moderate intensity statin.  - Reviewed long term complications of uncontrolled cholesterol - Recommend to continue atorvastatin  10 mg daily.     Requested for [facility] to obtain transfers from [facility] for benztropine , gabapentin , levothyroxine , risperidone , and trazodone .    Follow Up Plan: PCP 12/11/23, Pharmacist telephone 01/08/24   Lorain Baseman, PharmD [facility] [phone]

## 2023-12-07 ENCOUNTER — Telehealth: Payer: Self-pay | Admitting: *Deleted

## 2023-12-07 ENCOUNTER — Other Ambulatory Visit: Payer: Self-pay | Admitting: Nurse Practitioner

## 2023-12-07 ENCOUNTER — Other Ambulatory Visit: Payer: Self-pay

## 2023-12-07 DIAGNOSIS — G629 Polyneuropathy, unspecified: Secondary | ICD-10-CM

## 2023-12-07 DIAGNOSIS — F259 Schizoaffective disorder, unspecified: Secondary | ICD-10-CM

## 2023-12-07 DIAGNOSIS — F4310 Post-traumatic stress disorder, unspecified: Secondary | ICD-10-CM

## 2023-12-07 DIAGNOSIS — E039 Hypothyroidism, unspecified: Secondary | ICD-10-CM

## 2023-12-07 MED ORDER — CLONAZEPAM 0.5 MG PO TABS
0.5000 mg | ORAL_TABLET | Freq: Two times a day (BID) | ORAL | 0 refills | Status: DC | PRN
  Filled 2023-12-07: qty 60, 30d supply, fill #0

## 2023-12-07 MED ORDER — GABAPENTIN 300 MG PO CAPS
300.0000 mg | ORAL_CAPSULE | Freq: Every day | ORAL | 2 refills | Status: AC
  Filled 2023-12-07: qty 30, 30d supply, fill #0
  Filled 2024-02-19: qty 90, 90d supply, fill #0
  Filled 2024-05-11: qty 90, 90d supply, fill #1

## 2023-12-07 MED ORDER — RISPERIDONE 2 MG PO TABS
ORAL_TABLET | ORAL | 3 refills | Status: DC
  Filled 2023-12-07: qty 120, 30d supply, fill #0
  Filled 2024-01-12 – 2024-01-28 (×2): qty 120, 30d supply, fill #1

## 2023-12-07 MED ORDER — GABAPENTIN 300 MG PO CAPS
300.0000 mg | ORAL_CAPSULE | Freq: Every day | ORAL | 2 refills | Status: DC
  Filled 2023-12-07: qty 90, 90d supply, fill #0

## 2023-12-07 MED ORDER — BENZTROPINE MESYLATE 1 MG PO TABS
1.0000 mg | ORAL_TABLET | Freq: Every day | ORAL | 0 refills | Status: DC
  Filled 2023-12-07: qty 30, 30d supply, fill #0
  Filled 2024-01-12 – 2024-01-28 (×3): qty 30, 30d supply, fill #1
  Filled 2024-03-09: qty 30, 30d supply, fill #2

## 2023-12-07 MED ORDER — LEVOTHYROXINE SODIUM 75 MCG PO TABS
75.0000 ug | ORAL_TABLET | Freq: Every day | ORAL | 1 refills | Status: DC
  Filled 2023-12-07: qty 90, 90d supply, fill #0
  Filled 2024-03-09: qty 90, 90d supply, fill #1

## 2023-12-07 MED ORDER — TRAZODONE HCL 100 MG PO TABS
200.0000 mg | ORAL_TABLET | Freq: Every day | ORAL | 2 refills | Status: DC
  Filled 2023-12-07: qty 180, 90d supply, fill #0

## 2023-12-07 NOTE — Progress Notes (Signed)
 Complex Care Management Note Care Guide Note  12/07/2023 Name: Kathryn [name] MRN: [id number] DOB: Sep 23, 1970   Complex Care Management Outreach Attempts: An unsuccessful telephone outreach was attempted today to offer the patient information about available complex care management services.  Follow Up Plan:  Additional outreach attempts will be made to offer the patient complex care management information and services.   Encounter Outcome:  No Answer Asencion Gell  [facility] HealthPopulation Health Care Guide  Direct Dial :[phone] Fax:[phone] Website: [email or web address]

## 2023-12-08 ENCOUNTER — Other Ambulatory Visit: Payer: Self-pay

## 2023-12-08 ENCOUNTER — Telehealth: Payer: Self-pay | Admitting: *Deleted

## 2023-12-08 NOTE — Progress Notes (Signed)
 Complex Care Management Note Care Guide Note  12/08/2023 Name: CANIYAH MURLEY MRN: [id number] DOB: Mar 06, 1971   Complex Care Management Outreach Attempts: A second unsuccessful outreach was attempted today to offer the patient with information about available complex care management services.  Follow Up Plan:  Additional outreach attempts will be made to offer the patient complex care management information and services.   Encounter Outcome:  No Answer Asencion Gell  [facility] HealthPopulation Health Care Guide  Direct Dial :[phone] Fax:[phone] Website: [email or web address]

## 2023-12-09 ENCOUNTER — Telehealth: Payer: Self-pay

## 2023-12-09 ENCOUNTER — Telehealth: Payer: Self-pay | Admitting: Nurse Practitioner

## 2023-12-09 NOTE — Transitions of Care (Post Inpatient/ED Visit) (Unsigned)
 12/09/2023  Patient ID: Kathryn [name], female   DOB: [birth year], 53 y.o.   MRN: [id number]  Situation: Patient reports she is out of her cogentin  and clonazepam . She states she no longer has a psychiatric provider due to being dismissed from service/practice.  Background:  Patient with PMI of depression, schizoaffective disorder, PTSD.  Assessment:  Patient called and notified that her primary care provider has called in a refill for her clonazepam  and sent a referral for a new psychiatrist. Patient advised that primary provider requested that Dr. Okey refill her cogentin  until she is able to establish care with new psychiatrist.  Patient verbalized understanding and voice appreciation for the assistance.   Recommendation:  Continue with current treatment plan. Ongoing transition of care follow up.  Arvin Seip RN, BSN, CCM [employer], Population Health Case Manager Phone: [phone]

## 2023-12-09 NOTE — Telephone Encounter (Signed)
 Patient was identified as falling into the [place] Measure - Diabetes.   Patient was: Appointment already scheduled for:  12/11/2023.

## 2023-12-10 ENCOUNTER — Telehealth: Payer: Self-pay | Admitting: *Deleted

## 2023-12-10 NOTE — Progress Notes (Signed)
 Complex Care Management Note Care Guide Note  12/10/2023 Name: Kathryn [name] MRN: [id number] DOB: [birth year]  Kathryn [name] is a 53 y.o. year old female who is a primary care patient of Paseda, Folashade R, FNP . The community resource team was consulted for assistance with Utilities   SDOH screenings and interventions completed:  Yes     SDOH Interventions Today    Flowsheet Row Most Recent Value  SDOH Interventions   Utilities Interventions Community Resources Provided  [patient provided Information on [employer] and also [employer]]     Care guide performed the following interventions: Patient provided with information about care guide support team and interviewed to confirm resource needs.  Follow Up Plan:  No further follow up planned at this time. The patient has been provided with needed resources.  Encounter Outcome:  Patient Visit Completed  [facility] Health Care Guide  Direct Dial :[phone] [phone] Website: [email or web address]

## 2023-12-11 ENCOUNTER — Telehealth: Payer: Self-pay

## 2023-12-11 ENCOUNTER — Other Ambulatory Visit: Payer: Self-pay

## 2023-12-11 ENCOUNTER — Encounter: Payer: Self-pay | Admitting: Nurse Practitioner

## 2023-12-11 ENCOUNTER — Ambulatory Visit: Payer: Self-pay | Admitting: Nurse Practitioner

## 2023-12-11 VITALS — BP 139/76 | HR 102 | Wt 164.0 lb

## 2023-12-11 DIAGNOSIS — Z1231 Encounter for screening mammogram for malignant neoplasm of breast: Secondary | ICD-10-CM

## 2023-12-11 DIAGNOSIS — Z09 Encounter for follow-up examination after completed treatment for conditions other than malignant neoplasm: Secondary | ICD-10-CM

## 2023-12-11 DIAGNOSIS — M5416 Radiculopathy, lumbar region: Secondary | ICD-10-CM

## 2023-12-11 DIAGNOSIS — I10 Essential (primary) hypertension: Secondary | ICD-10-CM

## 2023-12-11 DIAGNOSIS — E1165 Type 2 diabetes mellitus with hyperglycemia: Secondary | ICD-10-CM

## 2023-12-11 DIAGNOSIS — R350 Frequency of micturition: Secondary | ICD-10-CM

## 2023-12-11 DIAGNOSIS — D649 Anemia, unspecified: Secondary | ICD-10-CM

## 2023-12-11 DIAGNOSIS — Z794 Long term (current) use of insulin: Secondary | ICD-10-CM

## 2023-12-11 DIAGNOSIS — E162 Hypoglycemia, unspecified: Secondary | ICD-10-CM

## 2023-12-11 DIAGNOSIS — E785 Hyperlipidemia, unspecified: Secondary | ICD-10-CM

## 2023-12-11 LAB — POCT URINE DIPSTICK
Bilirubin, UA: NEGATIVE
Blood, UA: NEGATIVE
Glucose, UA: NEGATIVE mg/dL
Ketones, POC UA: NEGATIVE mg/dL
Leukocytes, UA: NEGATIVE
Nitrite, UA: NEGATIVE
POC PROTEIN,UA: NEGATIVE
Spec Grav, UA: 1.015 (ref 1.010–1.025)
Urobilinogen, UA: 0.2 U/dL
pH, UA: 7 (ref 5.0–8.0)

## 2023-12-11 MED ORDER — BASAGLAR KWIKPEN 100 UNIT/ML ~~LOC~~ SOPN: 17.0000 [IU] | PEN_INJECTOR | Freq: Two times a day (BID) | SUBCUTANEOUS | Status: DC

## 2023-12-11 MED ORDER — GVOKE HYPOPEN 2-PACK 1 MG/0.2ML ~~LOC~~ SOAJ
1.0000 mg | Freq: Every day | SUBCUTANEOUS | 1 refills | Status: AC | PRN
  Filled 2023-12-11: qty 0.2, 28d supply, fill #0
  Filled 2023-12-11: qty 0.2, fill #0

## 2023-12-11 MED ORDER — BASAGLAR KWIKPEN 100 UNIT/ML ~~LOC~~ SOPN
17.0000 [IU] | PEN_INJECTOR | Freq: Two times a day (BID) | SUBCUTANEOUS | 0 refills | Status: DC
Start: 2023-12-11 — End: 2024-01-22
  Filled 2023-12-11: qty 15, 15d supply, fill #0

## 2023-12-11 NOTE — Assessment & Plan Note (Signed)
 Advised to take Tylenol  650 mg every 6 hours as needed Continue gabapentin  300 mg daily, duloxetine  60 mg twice daily

## 2023-12-11 NOTE — Assessment & Plan Note (Signed)
 Lab Results  Component Value Date   HGBA1C 8.0 (H) 11/24/2023  Increased Basaglar   to 17 units twice daily continue sliding scale NovoLog  CBG goals discussed Treatment for hypoglycemia discussed Patient counseled on low-carb diet Encouraged to follow-up with the nutritionist  for diabetes education Appreciates collaboration with the clinical pharmacist

## 2023-12-11 NOTE — Assessment & Plan Note (Signed)
.    CBG goals discussed Treatment for hypoglycemia discussed Glucagon 1 mg daily as needed injection ordered for severe hypoglycemia

## 2023-12-11 NOTE — Patient Instructions (Addendum)
 Please call the nutritionist on [phone] to make an appointment for diabetes nutrition education    Hypoglycemia  - Glucagon (GVOKE HYPOPEN 2-PACK) 1 MG/0.2ML SOAJ; Inject 1 mg into the skin daily as needed.  Dispense: 0.2 mL; Refill: 1   Uncontrolled type 2 diabetes mellitus with hyperglycemia, with long-term current use of insulin  (HCC)  - Insulin  Glargine (BASAGLAR  KWIKPEN) 100 UNIT/ML; Inject 17 Units into the skin 2 (two) times daily. May increase up to 50 units twice daily if instructed by your provider.  Dispense: 15 mL; Refill: ML     Goal for fasting blood sugar ranges from 80 to 120 and 2 hours after any meal or at bedtime should be between 130 to 170.   Carry rapidly absorbed carbohydrate source at all times Treats low glucose less than 70 as per rule of 15's.  Give 15 g of rapidly absorbed carbohydrate i.e. half cup juice or 4 glucose tabs.  Recheck glucose level in 15 minutes.  Give another 15 g of carbohydrate if glucose still less than 70.  Repeat until glucose level is greater than 70.  Once glucose level returns to normal consider follow-up with a snack or meal.  Informed provider of hypoglycemia episode at next appointment.  If severe call 911 and given glucagon injection.     For back pain please take Tylenol  650 mg every 6 hours as needed, I also encourage use of heating pad    Around 3 times per week, check your blood pressure 2 times per day. once in the morning and once in the evening. The readings should be at least one minute apart. Write down these values and bring them to your next nurse visit/appointment.  When you check your BP, make sure you have been doing something calm/relaxing 5 minutes prior to checking. Both feet should be flat on the floor and you should be sitting. Use your left arm and make sure it is in a relaxed position (on a table), and that the cuff is at the approximate level/height of your heart.   Please call the office if your blood pressure  is consistently greater than 130/80     It is important that you exercise regularly at least 30 minutes 5 times a week as tolerated  Think about what you will eat, plan ahead. Choose  clean, green, fresh or frozen over canned, processed or packaged foods which are more sugary, salty and fatty. 70 to 75% of food eaten should be vegetables and fruit. Three meals at set times with snacks allowed between meals, but they must be fruit or vegetables. Aim to eat over a 12 hour period , example 7 am to 7 pm, and STOP after  your last meal of the day. Drink water ,generally about 64 ounces per day, no other drink is as healthy. Fruit juice is best enjoyed in a healthy way, by EATING the fruit.  Thanks for choosing [facility] we consider it a privelige to serve you.

## 2023-12-11 NOTE — Assessment & Plan Note (Addendum)
 Lab Results  Component Value Date   COLORU yellow 12/11/2023   CLARITYU clear 12/11/2023   GLUCOSEUR negative 12/11/2023   BILIRUBINUR negative 12/11/2023   SPECGRAV 1.015 12/11/2023   RBCUR negative 12/11/2023   PHUR 7.0 12/11/2023   PROTEINUR NEGATIVE 11/23/2023   UROBILINOGEN 0.2 12/11/2023   LEUKOCYTESUR Negative 12/11/2023  Negative for UTI could be due to uncontrolled type 2 diabetes Will increase dose of Basaglar  to 17 units twice daily, continue sliding scale NovoLog 

## 2023-12-11 NOTE — Assessment & Plan Note (Signed)
 Systolic blood pressure is elevated but diastolic blood pressure is well-controlled Continue losartan  25 mg daily, will increase dose of losartan  at next visit if blood pressure remains elevated Encouraged to monitor blood pressure at home and report blood pressure readings consistently greater than 130/80 DASH diet and commitment to daily physical activity for a minimum of 30 minutes discussed and encouraged, as a part of hypertension management.      12/11/2023    9:25 AM 12/11/2023    9:15 AM 12/03/2023    2:33 PM 11/27/2023    8:07 AM 11/27/2023    4:04 AM 11/26/2023   11:57 PM 11/26/2023    7:00 PM  BP/Weight  Systolic BP 139 139  98 96 104 131  Diastolic BP 76 75  73 67 72 72  Wt. (Lbs)  164 150      BMI  32.03 kg/m2 29.29 kg/m2

## 2023-12-11 NOTE — Telephone Encounter (Signed)
 Copied from CRM [id number]. Topic: General - Other >> Dec 11, 2023  9:59 AM Marissa P wrote: Reason for CRM: Marinda with [employer] called following up on some faxes in regards to requesting for all active medication prescribed by doctor, please follow up [phone]

## 2023-12-11 NOTE — Assessment & Plan Note (Signed)
 Lab Results  Component Value Date   WBC 6.1 11/26/2023   HGB 10.6 (L) 11/26/2023   HCT 31.6 (L) 11/26/2023   MCV 86.6 11/26/2023   PLT 290 11/26/2023  Patient denies abnormal bleeding Checking CBC and iron panel

## 2023-12-11 NOTE — Assessment & Plan Note (Signed)
 Lab Results  Component Value Date   CHOL 98 (L) 02/25/2023   HDL 48 02/25/2023   LDLCALC 35 02/25/2023   TRIG 71 02/25/2023   CHOLHDL 2.0 02/25/2023   controlled on atorvastatin  10 mg daily Continue current medication

## 2023-12-11 NOTE — Telephone Encounter (Signed)
 GVOKE IS OVER $300 FOR UNINSURED PATIENT. REQUEST MADE FOR GVOKE PATIENT ASSISTANCE ENROLLMENT INITIATION BY CALLING [phone]. PER PROGRAM PROCEDURE, APPLICATION WILL BE MAILED TO PATIENT AND PATIENT WILL NEED TO MAIL BACK OR HAVE OFFICE/PHARMACY FAX BACK ON HER BEHALF ONCE COMPLETED.

## 2023-12-11 NOTE — Progress Notes (Addendum)
 Established Patient Office Visit  Subjective:  Patient ID: Kathryn [name], female    DOB: Oct 01, 1970  Age: 53 y.o. MRN: [id number]  CC:  Chief Complaint  Patient presents with   Hospitalization Follow-up    HPI Kathryn [name] is a 53 y.o. female  has a past medical history of Anemia, Anxiety, Asthma, Depression, GERD (gastroesophageal reflux disease), HLD (hyperlipidemia) (04/07/2019), Hypertension, Hypothyroidism, adult (04/07/2019), Neuropathy, Obesity (BMI 30.0-34.9) (04/07/2019), Schizoaffective disorder (HCC), and Type II diabetes mellitus, uncontrolled (04/27/2019).  Patient presents for hospitalization follow-up  Patient was on admission at the hospital from 11/23/2023 to 11/27/2023 for hypoglycemia.    Hypoglycemia /type 2 diabetes Trulicity  and glipizide  was discontinued, started on Basaglar  15 units daily and sliding scale NovoLog .  CBG logs from home reviewed fasting CBG has been between Fasting CBG 79- 150, 140-200 at lunch.  She has been avoiding sweets and soda, plans on getting a CGM once she gets her Medicaid, she is also looking at applying for disability for type 2 diabetes, in the meantime would want to remain at home on FMLA until she feels better.  Patient complains of urine frequency and pelvic pressure no fever, dysuria  Hypertension.  Blood pressure was soft at the hospital, currently taking losartan  25 mg daily, amlodipine  and carvedilol  have been discontinued.  She stated that her blood pressure at home was 118/80 the last time the checked it.    She is accompanied by her spouse       Lab Results  Component Value Date   HGBA1C 8.0 (H) 11/24/2023     Past Medical History:  Diagnosis Date   Anemia    Anxiety    Asthma    Depression    GERD (gastroesophageal reflux disease)    HLD (hyperlipidemia) 04/07/2019   Hypertension    Hypothyroidism, adult 04/07/2019   Neuropathy    Obesity (BMI 30.0-34.9) 04/07/2019   Schizoaffective disorder (HCC)    Type  II diabetes mellitus, uncontrolled 04/27/2019    Past Surgical History:  Procedure Laterality Date   BACK SURGERY  09/06/2020   BIOPSY  05/03/2021   Procedure: BIOPSY;  Surgeon: Eartha Angelia Sieving, MD;  [facility];  Service: Gastroenterology;;   BREAST SURGERY Right    biopsy   CESAREAN SECTION     CHOLECYSTECTOMY  06/02/2012   Procedure: LAPAROSCOPIC CHOLECYSTECTOMY;  Surgeon: Oneil DELENA Budge, MD;  Location: [facility];  Service: General;  Laterality: N/A;  Attempted laparoscopic cholecystectomy   CHOLECYSTECTOMY  06/02/2012   Procedure: CHOLECYSTECTOMY;  Surgeon: Oneil DELENA Budge, MD;  Location: [facility];  Service: General;  Laterality: N/A;  converted to open at  0905   COLONOSCOPY WITH PROPOFOL  N/A 07/18/2020   prep was fair, one 4mm polyp (inflammatory) stool in descending colon, transverse and ascending, distal rectum and anal verge normal   ESOPHAGEAL DILATION  12/31/2018   Procedure: ESOPHAGEAL DILATION;  Surgeon: Golda Claudis PENNER, MD;  [facility];  Service: Endoscopy;;   ESOPHAGOGASTRODUODENOSCOPY (EGD) WITH PROPOFOL  N/A 08/24/2015   Procedure: ESOPHAGOGASTRODUODENOSCOPY (EGD) WITH PROPOFOL ;  Surgeon: Claudis PENNER Golda, MD;  [facility];  Service: Endoscopy;  Laterality: N/A;  1:10 - Ann to notify pt to arrive at 11:30   ESOPHAGOGASTRODUODENOSCOPY (EGD) WITH PROPOFOL  N/A 12/31/2018   normal, no abnormality to explain dysphagia, esophagus dilated.   ESOPHAGOGASTRODUODENOSCOPY (EGD) WITH PROPOFOL  N/A 05/03/2021   Procedure: ESOPHAGOGASTRODUODENOSCOPY (EGD) WITH PROPOFOL ;  Surgeon: Eartha Angelia Sieving, MD;  [facility];  Service: Gastroenterology;  Laterality: N/A;  1:20   FLEXIBLE SIGMOIDOSCOPY  07/17/2020   Procedure: FLEXIBLE SIGMOIDOSCOPY;  Surgeon: Eartha Flavors, Toribio, MD;  [facility];  Service: Gastroenterology;;   POLYPECTOMY  07/18/2020   Procedure: POLYPECTOMY INTESTINAL;  Surgeon: Eartha Flavors,  Toribio, MD;  [facility];  Service: Gastroenterology;;  ascending colon polyp;    SAVORY DILATION  05/03/2021   Procedure: SAVORY DILATION;  Surgeon: Eartha Flavors Toribio, MD;  [facility];  Service: Gastroenterology;;   tooth removal  2019   all teeth removed   TUBAL LIGATION     X3    Family History  Problem Relation Age of Onset   Hypertension Mother    Alcohol abuse Mother    Heart disease Mother    Kidney disease Mother    Aneurysm Mother        brain   Hypertension Father    Alcohol abuse Sister    Alcohol abuse Maternal Aunt    Cancer Maternal Aunt    Alcohol abuse Paternal Aunt    Alcohol abuse Maternal Grandmother    Alcohol abuse Maternal Grandfather    Hearing loss Daughter    Alcohol abuse Cousin    Stroke Other    Diabetes Other    Cancer Other    Seizures Other     Social History   Socioeconomic History   Marital status: Married    Spouse name: [name]   Number of children: 3   Years of education: 12   Highest education level: Not on file  Occupational History    Comment: [employer]    Comment: 3rd shift  Tobacco Use   Smoking status: Never    Passive exposure: Never   Smokeless tobacco: Never  Vaping Use   Vaping status: Never Used  Substance and Sexual Activity   Alcohol use: No    Alcohol/week: 0.0 standard drinks of alcohol   Drug use: No   Sexual activity: Not Currently    Birth control/protection: Surgical  Other Topics Concern   Not on file  Social History Narrative   Married for 22 years.On disability secondary to schizophrenia.   Lives with husband.   Caffeine- decaf coffee, tea   Social Drivers of Corporate investment banker Strain: Not on file  Food Insecurity: No Food Insecurity (12/03/2023)   Hunger Vital Sign    Worried About Running Out of Food in the Last Year: Never true    Ran Out of Food in the Last Year: Never true  Transportation Needs: No Transportation Needs (12/03/2023)   PRAPARE -  Administrator, Civil Service (Medical): No    Lack of Transportation (Non-Medical): No  Physical Activity: Not on file  Stress: Not on file  Social Connections: Socially Isolated (11/24/2023)   Social Connection and Isolation Panel    Frequency of Communication with Friends and Family: Never    Frequency of Social Gatherings with Friends and Family: Never    Attends Religious Services: Never    Database administrator or Organizations: No    Attends Banker Meetings: Never    Marital Status: Married  Catering manager Violence: Not At Risk (12/03/2023)   Humiliation, Afraid, Rape, and Kick questionnaire    Fear of Current or Ex-Partner: No    Emotionally Abused: No    Physically Abused: No    Sexually Abused: No    Outpatient Medications Prior to Visit  Medication Sig Dispense Refill  albuterol  (VENTOLIN  HFA) 108 (90 Base) MCG/ACT inhaler Inhale 2 puffs into the lungs every 6 (six) hours as needed for wheezing or shortness of breath. 8 g 2   aspirin  EC 81 MG tablet Take 81 mg by mouth daily.     atorvastatin  (LIPITOR) 10 MG tablet Take 1 tablet (10 mg total) by mouth daily. 90 tablet 2   benztropine  (COGENTIN ) 1 MG tablet Take 1 tablet (1 mg total) by mouth at bedtime. 90 tablet 0   Blood Glucose Monitoring Suppl (TRUE METRIX METER) w/Device KIT Use as directed to monitor blood sugar. 1 kit 0   Cholecalciferol (VITAMIN D -3) 125 MCG (5000 UT) TABS Take 2 tablets by mouth daily. 30 tablet 1   clonazePAM  (KLONOPIN ) 0.5 MG tablet Take 1 tablet (0.5 mg total) by mouth 2 (two) times daily as needed for anxiety. 60 tablet 0   DULoxetine  (CYMBALTA ) 60 MG capsule Take 1 capsule (60 mg total) by mouth 2 (two) times daily. 180 capsule 2   gabapentin  (NEURONTIN ) 300 MG capsule Take 1 capsule (300 mg total) by mouth daily. 90 capsule 2   glucose blood (TRUE METRIX BLOOD GLUCOSE TEST) test strip Use as instructed to monitor blood sugar twice daily 50 each 11   insulin  aspart  (NOVOLOG ) 100 UNIT/ML FlexPen Before each meal 3 times a day, 140-199 - 2 units, 200-250 - 4 units, 251-299 - 6 units,  300-349 - 8 units,  350 or above 10 units. 15 mL 0   Insulin  Pen Needle (PEN NEEDLES) 31G X 6 MM MISC For 4 times a day insulin , 1 month supply. Diagnosis E11.65 100 each 0   levothyroxine  (SYNTHROID ) 75 MCG tablet Take 1 tablet (75 mcg total) by mouth daily before breakfast. 90 tablet 1   losartan  (COZAAR ) 100 MG tablet Take 1 tablet (100 mg total) by mouth daily. 90 tablet 3   Multiple Vitamin (MULTIVITAMIN) tablet Take 1 tablet by mouth daily.     risperiDONE  (RISPERDAL ) 2 MG tablet TAKE 1 TABLET EVERY MORNING AND 3 TABLETS AT BEDTIME 360 tablet 3   rizatriptan  (MAXALT ) 10 MG tablet Take 1 tablet (10 mg total) by mouth as needed for migraine. May repeat in 2 hours if needed 10 tablet 2   topiramate  (TOPAMAX ) 100 MG tablet TAKE 1 TABLET BY MOUTH EVERYDAY AT BEDTIME 90 tablet 1   traZODone  (DESYREL ) 100 MG tablet Take 2 tablets (200 mg total) by mouth at bedtime. 180 tablet 2   TRUEplus Lancets 30G MISC Use as directed to monitor blood sugar twice daily 100 each 3   Insulin  Glargine (BASAGLAR  KWIKPEN) 100 UNIT/ML Inject 15 Units into the skin daily. May increase up to 50 units twice daily if instructed by your provider. 15 mL ML   No facility-administered medications prior to visit.    Allergies  Allergen Reactions   Metformin Hcl Diarrhea    ROS Review of Systems  Constitutional:  Negative for appetite change, diaphoresis, fatigue and fever.  HENT:  Negative for congestion, postnasal drip, rhinorrhea and sneezing.   Respiratory:  Negative for cough, shortness of breath and wheezing.   Cardiovascular:  Negative for chest pain, palpitations and leg swelling.  Gastrointestinal:  Negative for abdominal pain, constipation, nausea and vomiting.  Genitourinary:  Negative for difficulty urinating, dysuria, flank pain and frequency.  Musculoskeletal:  Positive for back pain.  Negative for arthralgias, joint swelling and myalgias.  Skin:  Negative for color change, pallor and rash.  Neurological:  Negative for seizures, facial asymmetry,  weakness and numbness.  Psychiatric/Behavioral:  Negative for behavioral problems, confusion, self-injury and suicidal ideas.       Objective:    Physical Exam Vitals and nursing note reviewed.  Constitutional:      General: She is not in acute distress.    Appearance: Normal appearance. She is not ill-appearing, toxic-appearing or diaphoretic.  HENT:     Mouth/Throat:     Pharynx: Oropharynx is clear.  Eyes:     General: No scleral icterus. Cardiovascular:     Rate and Rhythm: Normal rate and regular rhythm.     Pulses: Normal pulses.     Heart sounds: Normal heart sounds. No murmur heard.    No friction rub. No gallop.  Pulmonary:     Effort: Pulmonary effort is normal. No respiratory distress.     Breath sounds: Normal breath sounds. No stridor. No wheezing, rhonchi or rales.  Chest:     Chest wall: No tenderness.  Abdominal:     General: There is no distension.     Palpations: Abdomen is soft.     Tenderness: There is no abdominal tenderness. There is no right CVA tenderness, left CVA tenderness or guarding.  Musculoskeletal:        General: No swelling or signs of injury.     Right lower leg: No edema.     Left lower leg: No edema.  Skin:    General: Skin is warm and dry.     Capillary Refill: Capillary refill takes less than 2 seconds.     Coloration: Skin is not jaundiced or pale.     Findings: No bruising, erythema or lesion.  Neurological:     Mental Status: She is alert and oriented to person, place, and time.     Motor: No weakness.     Gait: Gait normal.  Psychiatric:        Mood and Affect: Mood normal.        Behavior: Behavior normal.        Thought Content: Thought content normal.        Judgment: Judgment normal.     BP 139/76   Pulse (!) 102   Wt 164 lb (74.4 kg)   LMP 01/23/2013    SpO2 100%   BMI 32.03 kg/m  Wt Readings from Last 3 Encounters:  12/11/23 164 lb (74.4 kg)  12/03/23 150 lb (68 kg)  11/23/23 155 lb (70.3 kg)    Lab Results  Component Value Date   TSH 0.652 10/23/2023   Lab Results  Component Value Date   WBC 6.0 12/11/2023   HGB 11.3 12/11/2023   HCT 34.8 12/11/2023   MCV 88 12/11/2023   PLT 294 12/11/2023   Lab Results  Component Value Date   NA 141 12/11/2023   K 4.0 12/11/2023   CO2 20 12/11/2023   GLUCOSE 142 (H) 12/11/2023   BUN 15 12/11/2023   CREATININE 1.09 (H) 12/11/2023   BILITOT 0.6 11/24/2023   ALKPHOS 62 11/24/2023   AST 19 11/24/2023   ALT 22 11/24/2023   PROT 7.4 11/24/2023   ALBUMIN 3.7 11/24/2023   CALCIUM  9.5 12/11/2023   ANIONGAP 7 11/27/2023   EGFR 61 12/11/2023   Lab Results  Component Value Date   CHOL 98 (L) 02/25/2023   Lab Results  Component Value Date   HDL 48 02/25/2023   Lab Results  Component Value Date   LDLCALC 35 02/25/2023   Lab Results  Component Value Date  TRIG 71 02/25/2023   Lab Results  Component Value Date   CHOLHDL 2.0 02/25/2023   Lab Results  Component Value Date   HGBA1C 8.0 (H) 11/24/2023      Assessment & Plan:   Problem List Items Addressed This Visit       Cardiovascular and Mediastinum   Essential hypertension, benign   Systolic blood pressure is elevated but diastolic blood pressure is well-controlled Continue losartan  25 mg daily, will increase dose of losartan  at next visit if blood pressure remains elevated Encouraged to monitor blood pressure at home and report blood pressure readings consistently greater than 130/80 DASH diet and commitment to daily physical activity for a minimum of 30 minutes discussed and encouraged, as a part of hypertension management.      12/11/2023    9:25 AM 12/11/2023    9:15 AM 12/03/2023    2:33 PM 11/27/2023    8:07 AM 11/27/2023    4:04 AM 11/26/2023   11:57 PM 11/26/2023    7:00 PM  BP/Weight  Systolic BP 139 139   98 96 104 131  Diastolic BP 76 75  73 67 72 72  Wt. (Lbs)  164 150      BMI  32.03 kg/m2 29.29 kg/m2               Relevant Orders   Basic Metabolic Panel (Completed)     Endocrine   Uncontrolled type 2 diabetes mellitus with hyperglycemia, with long-term current use of insulin  (HCC)   Lab Results  Component Value Date   HGBA1C 8.0 (H) 11/24/2023  Increased Basaglar   to 17 units twice daily continue sliding scale NovoLog  CBG goals discussed Treatment for hypoglycemia discussed Patient counseled on low-carb diet Encouraged to follow-up with the nutritionist  for diabetes education Appreciates collaboration with the clinical pharmacist      Relevant Medications   Glucagon  (GVOKE HYPOPEN  2-PACK) 1 MG/0.2ML SOAJ   Insulin  Glargine (BASAGLAR  KWIKPEN) 100 UNIT/ML   Hypoglycemia - Primary   .  CBG goals discussed Treatment for hypoglycemia discussed Glucagon  1 mg daily as needed injection ordered for severe hypoglycemia      Relevant Medications   Glucagon  (GVOKE HYPOPEN  2-PACK) 1 MG/0.2ML SOAJ     Nervous and Auditory   Lumbar radiculopathy   Advised to take Tylenol  650 mg every 6 hours as needed Continue gabapentin  300 mg daily, duloxetine  60 mg twice daily        Other   Dyslipidemia, goal LDL below 70   Lab Results  Component Value Date   CHOL 98 (L) 02/25/2023   HDL 48 02/25/2023   LDLCALC 35 02/25/2023   TRIG 71 02/25/2023   CHOLHDL 2.0 02/25/2023   controlled on atorvastatin  10 mg daily Continue current medication      Frequency of urination   Lab Results  Component Value Date   COLORU yellow 12/11/2023   CLARITYU clear 12/11/2023   GLUCOSEUR negative 12/11/2023   BILIRUBINUR negative 12/11/2023   SPECGRAV 1.015 12/11/2023   RBCUR negative 12/11/2023   PHUR 7.0 12/11/2023   PROTEINUR NEGATIVE 11/23/2023   UROBILINOGEN 0.2 12/11/2023   LEUKOCYTESUR Negative 12/11/2023  Negative for UTI could be due to uncontrolled type 2 diabetes Will increase  dose of Basaglar  to 17 units twice daily, continue sliding scale NovoLog       Relevant Orders   POCT URINE DIPSTICK (Completed)   Anemia   Lab Results  Component Value Date   WBC 6.1 11/26/2023   HGB  10.6 (L) 11/26/2023   HCT 31.6 (L) 11/26/2023   MCV 86.6 11/26/2023   PLT 290 11/26/2023  Patient denies abnormal bleeding Checking CBC and iron panel      Relevant Orders   CBC (Completed)   Iron, TIBC and Ferritin Panel (Completed)   Vitamin B12 (Completed)   Hospital discharge follow-up   Hospital chart reviewed, including discharge summary Medications reconciled and reviewed with the patient in detail       Other Visit Diagnoses       Screening mammogram for breast cancer       Relevant Orders   MM 3D SCREENING MAMMOGRAM BILATERAL BREAST       Meds ordered this encounter  Medications   Glucagon  (GVOKE HYPOPEN  2-PACK) 1 MG/0.2ML SOAJ    Sig: Inject 1 mg into the skin daily as needed.    Dispense:  0.2 mL    Refill:  1    PAP ONLY-GVOKE PROGRAM MAILING PAP APP TO PT 7/18 FOR ASSISTANCE   DISCONTD: Insulin  Glargine (BASAGLAR  KWIKPEN) 100 UNIT/ML    Sig: Inject 17 Units into the skin 2 (two) times daily. May increase up to 50 units twice daily if instructed by your provider.    Dispense:  15 mL    Refill:  ML    Can substitute to any approved long-acting insulin , 1 month supply, dispense as needed syringes and needles.   Insulin  Glargine (BASAGLAR  KWIKPEN) 100 UNIT/ML    Sig: Inject 17 Units into the skin 2 (two) times daily. May increase up to 50 units twice daily if instructed by your provider.    Dispense:  15 mL    Refill:  0    Can substitute to any approved long-acting insulin , 1 month supply, dispense as needed syringes and needles.    Follow-up: Return in about 6 weeks (around 01/22/2024) for HTN, DM.    Humbert Morozov R Jamilia Jacques, FNP

## 2023-12-14 ENCOUNTER — Ambulatory Visit: Payer: Self-pay | Admitting: Nurse Practitioner

## 2023-12-15 LAB — BASIC METABOLIC PANEL WITH GFR
BUN/Creatinine Ratio: 14 (ref 9–23)
BUN: 15 mg/dL (ref 6–24)
CO2: 20 mmol/L (ref 20–29)
Calcium: 9.5 mg/dL (ref 8.7–10.2)
Chloride: 106 mmol/L (ref 96–106)
Creatinine, Ser: 1.09 mg/dL — ABNORMAL HIGH (ref 0.57–1.00)
Glucose: 142 mg/dL — ABNORMAL HIGH (ref 70–99)
Potassium: 4 mmol/L (ref 3.5–5.2)
Sodium: 141 mmol/L (ref 134–144)
eGFR: 61 mL/min/1.73 (ref >59)

## 2023-12-15 LAB — CBC
Hematocrit: 34.8 % (ref 34.0–46.6)
Hemoglobin: 11.3 g/dL (ref 11.1–15.9)
MCH: 28.7 pg (ref 26.6–33.0)
MCHC: 32.5 g/dL (ref 31.5–35.7)
MCV: 88 fL (ref 79–97)
Platelets: 294 x10E3/uL (ref 150–450)
RBC: 3.94 x10E6/uL (ref 3.77–5.28)
RDW: 12.6 % (ref 11.7–15.4)
WBC: 6 x10E3/uL (ref 3.4–10.8)

## 2023-12-15 LAB — IRON,TIBC AND FERRITIN PANEL
Ferritin: 113 ng/mL (ref 15–150)
Iron Saturation: 29 % (ref 15–55)
Iron: 90 ug/dL (ref 27–159)
Total Iron Binding Capacity: 311 ug/dL (ref 250–450)
UIBC: 221 ug/dL (ref 131–425)

## 2023-12-15 LAB — VITAMIN B12: Vitamin B-12: 370 pg/mL (ref 232–1245)

## 2023-12-17 ENCOUNTER — Telehealth: Payer: Self-pay

## 2023-12-18 ENCOUNTER — Telehealth: Payer: Self-pay

## 2023-12-18 ENCOUNTER — Ambulatory Visit: Payer: Self-pay | Admitting: Nurse Practitioner

## 2023-12-21 ENCOUNTER — Telehealth: Payer: Self-pay

## 2023-12-21 NOTE — Transitions of Care (Post Inpatient/ED Visit) (Signed)
  Transition of Care week 3  Visit Note  12/21/2023  Name: Kathryn [name] MRN: [id number]          DOB: [birth year]  Situation: Patient enrolled in [facility] 30-day program. Unable to maintain contact with patient after 3 telephone attempts.   Background: Admitted for Hypoglycemia     Past Medical History:  Diagnosis Date   Anemia    Anxiety    Asthma    Depression    GERD (gastroesophageal reflux disease)    HLD (hyperlipidemia) 04/07/2019   Hypertension    Hypothyroidism, adult 04/07/2019   Neuropathy    Obesity (BMI 30.0-34.9) 04/07/2019   Schizoaffective disorder (HCC)    Type II diabetes mellitus, uncontrolled 04/27/2019    Goals Addressed             This Visit's Progress    COMPLETED: [facility] Transitions of Care (TOC) Care Plan       Enrolled goals not met due to being unable to reach x 3.    Problems:  Recent Hospitalization for treatment of hypoglycemia Knowledge Deficit Related to diabetes management, anxiety and limited resources /lack of medical insurance.   Goal:  Over the next 30 days, the patient will not experience hospital readmission  Interventions:   Diabetes Interventions: Assessed patient's understanding of A1c goal: <7% Provided education to patient about basic DM disease process Reviewed medications with patient and discussed importance of medication adherence Discussed plans with patient for ongoing care management follow up and provided patient with direct contact information for care management team Provided patient with written educational materials related to hypo and hyperglycemia and importance of correct treatment Reviewed scheduled/upcoming provider appointments including:   Referral made to social work team for assistance with obtaining new psychiatrist, lack of medical insurance and increase in anxiety symptoms due to family dynamics Referral made to community resources care guide team for assistance with utilities Assessed social  determinant of health barriers Message sent to patients primary care provider regarding need for refill for her clonazepam  and cogentin , GAD 7 results, and symptoms of fainting and fast heart / irregular heart beat.   Lab Results  Component Value Date   HGBA1C 8.0 (H) 11/24/2023    Patient Self Care Activities:  Attend all scheduled provider appointments Call pharmacy for medication refills 3-7 days in advance of running out of medications Call provider office for new concerns or questions  Notify RN Care Manager of TOC call rescheduling needs Take medications as prescribed   check blood sugar at prescribed times: 4 times daily and when you have symptoms of low or high blood sugar Review education article on diabetes/ hypoglycemia sent to you in the mail by your RN case manager.   Plan:  Telephone follow up appointment with care management team member scheduled for:  12/11/23 at 2 pm         Follow Up Plan:   Closing From:  Transitions of Care Program. Unable to re-establish contact with patient after 3 unsuccessful calls.   Arvin Seip RN, BSN, CCM [employer], Population Health Case Manager Phone: [phone]

## 2023-12-23 ENCOUNTER — Telehealth: Payer: Self-pay | Admitting: *Deleted

## 2023-12-23 NOTE — Progress Notes (Signed)
 Complex Care Management Note Care Guide Note  12/23/2023 Name: WYNTER ISAACS MRN: [id number] DOB: December 03, 1970   Complex Care Management Outreach Attempts: An unsuccessful telephone outreach was attempted today to offer the patient information about available complex care management services.  Follow Up Plan:  Additional outreach attempts will be made to offer the patient complex care management information and services.   Encounter Outcome:  No Answer  Harlene Satterfield  [facility] Health  [facility], [facility] Guide  Direct Dial : [phone]  Fax [phone]

## 2023-12-25 NOTE — Progress Notes (Signed)
 Complex Care Management Note Care Guide Note  12/25/2023 Name: Kathryn [name] MRN: [id number] DOB: 1970-08-24   Complex Care Management Outreach Attempts: A second unsuccessful outreach was attempted today to offer the patient with information about available complex care management services.  Follow Up Plan:  Additional outreach attempts will be made to offer the patient complex care management information and services.   Encounter Outcome:  No Answer  Harlene Satterfield  [facility]  [facility], [facility] Guide  Direct Dial : [phone]  Fax [phone]

## 2023-12-28 NOTE — Progress Notes (Signed)
 Complex Care Management Note Care Guide Note  12/28/2023 Name: Kathryn [name] MRN: [id number] DOB: 06/01/1970   Complex Care Management Outreach Attempts: A third unsuccessful outreach was attempted today to offer the patient with information about available complex care management services.  Follow Up Plan:  No further outreach attempts will be made at this time. We have been unable to contact the patient to offer or enroll patient in complex care management services.  Encounter Outcome:  No Answer  Harlene Satterfield  [facility] Health  [facility], [facility] Guide  Direct Dial : [phone]  Fax [phone]

## 2024-01-08 ENCOUNTER — Other Ambulatory Visit: Payer: Self-pay

## 2024-01-08 ENCOUNTER — Other Ambulatory Visit: Payer: Self-pay | Admitting: Nurse Practitioner

## 2024-01-08 DIAGNOSIS — Z09 Encounter for follow-up examination after completed treatment for conditions other than malignant neoplasm: Secondary | ICD-10-CM | POA: Insufficient documentation

## 2024-01-08 DIAGNOSIS — E1165 Type 2 diabetes mellitus with hyperglycemia: Secondary | ICD-10-CM

## 2024-01-08 NOTE — Assessment & Plan Note (Signed)
 Hospital chart reviewed, including discharge summary Medications reconciled and reviewed with the patient in detail

## 2024-01-08 NOTE — Progress Notes (Signed)
 01/08/2024 Name: Kathryn [name] MRN: [id number] DOB: 1971/05/19  Chief Complaint  Patient presents with   Diabetes   Hypertension   Hyperlipidemia    Kathryn [name] is a 53 y.o. year old female who presented for a telephone visit.   They were referred to the pharmacist by their PCP for assistance in managing diabetes. PMH includes HTN, asthma, IBS, dysphagia, gastroparesis with abnormal esophagram, T2DM with neuropathy, PTSD, schizoaffective disorder, BMI > 30.    Subjective: Patient was last seen by PCP, Lorice Shall, NP on 10/23/23. She reported taking glipizide  10 mg BID. She was not taking Jardiance  due to cost, and report she had not taken Tresiba  or Trulicity  in months. She reported trying to follow a low-carb diet and avoid soda, but still drinks sweet tea. She was instructed to restart Tresiba  80 units daily and Trulicity .  At pharmacy telephone appointment on 11/06/23, patient reported that she picked up Tresiba  from the pharmacy but it was > $200. She was not able to pick up Trulicity . She reported that she does not currently have insurance because open-enrollment at her job does not open until the fall. She has applied to Medicaid in the past, but reports it was > 2 years ago. She continued to report s/sx of hyperglycemia but did not have a way to monitor her BG. She was instructed to split her dose of Tresiba  for better absorption, continue glipizide , and start Trulicity  (via [facility] supply). She was also instructed to start monitoring her BG and was provided a prescription for the True Metrix meter.   Unfortunately, she was admitted from 11/23/23 to 11/27/23 for recurrent hypoglycemia (BG 49-63 mg/dL despite treatment with OJ). At discharge she was instructed to discontinue glipizide  and Trulicity , and take Basaglar  15 units daily and Novolog  sliding scale 3 times daily before meals. Her BP was well controlled inpatient on ARB monotherapy, so she was also instructed to stop amlodipine   and carvedilol , and continue losartan  25 mg daily. During her TOC follow-up call, it was identified that she has been dismissed from her psychiatry provider. Her GAD-7 was 21 and patient reported being out of her benztropine  and had 3 days remaining of clonazepam .   Patient was engaged by pharmacy via telephone on 12/04/23. She reported taking her Basaglar  and Novolog  as prescribed and regularly checking her BG. She discontinued amlodipine  and carvedilol  as instructed, but was still taking losartan  100 mg daily instead of 25 mg daily. She was instructed to continue her current regimen and monitor for hypoglycemia. She saw her PCP on 12/11/23.BP was 139/76 mmHg. She reported FBG 79-150 mg/dL and PPG 859-799 mg/dL. She was instructed to increase her Basaglar  to 17 units twice daily. Clarified with PCP that she meant to increase to 17 units once daily.    Today, patient reports doing well. She reports that she is taking Basaglar  17 units twice daily but she does sometimes forget her morning dose. She was near her medication bottles and confirmed she is still taking losartan  100 mg daily - though it was last filled back in March 2025.   Care Team: Primary Care Provider: Paseda, Folashade R, FNP ; Next Scheduled Visit: 01/29/24 Behavioral Health: Barnie Gull, MD: Missed appointment on 08/28/23:reports that she was dismissed from clinic. SW is going to assist her in finding a new provider.    Medication Access/Adherence  Current Pharmacy:  [facility] - [facility] [address], [address] [place] [address] Phone:  [phone] Fax: [phone]   Patient reports affordability concerns with their medications: Yes - has no insurance until open enrollment with her job until October.  Patient reports access/transportation concerns to their pharmacy: No  Patient reports adherence concerns with their medications:  Yes  - patient has a history of stopping medications  without reaching out to troubleshoot cost/adherence issues.    Diabetes:  Current medications: Basaglar  17 units twice daily (reports she often forgets to take it in the morning - then she just takes 17 at bedtime. Clarified with PCP today, that this was supposed to be written for 17 units once daily), Novolog  before each meal 3 times a day per sliding scale (140-199 - 2 units, 200-250 - 4 units, 251-299 - 6 units, 300-349 - 8 units, 350 or above 10 units) (reports that she often does not need to take this with meals due to her BG being controlled)  Medications tried in the past: metformin - intolerance, restarted Trulicity  0.75 mg on 11/09/23 then d/c at last hospitalization- has at home still.   Current glucose readings:  Using True Metrix meter - checking 3 times daily 01/08/24: 7AM 136 01/07/24: 8AM 132,  2PM 132, 9PM 198  01/06/24: 8AM 160, 5PM 142, 9PM 199 01/05/24: 7AM 108, 3PM 199, 6PM 185 01/04/24: AM 190, 2PM, 301, 9PM 254 (forgot her insulin ) Reports lowest sugar of 98 mg/dL - did have some symptoms with this number  Previously used Dexcom G7 when she had insurance  Patient denies hypoglycemic s/sx including dizziness, shakiness, sweatiness. Patient denies hyperglycemic symptoms including nocturia, polyuria, polydipsia, polyphagia, nocturia, blurred vision, neuropathy.  Current meal patterns: Reports she is following a carb-modified diet at home  - Drinks: reports she has been drinking mostly water  during the day, she has had black tea with a little bit of honey, green juice. No sodas or sweet tea.  Current physical activity: walking at work  Current medication access support: Enrolled in [place]. Patient is willing to reapply for Medicaid.  Hypertension:  Current medications: losartan  100 mg daily (found her bottle of 100 mg tablets - reports she is taking once daily) Medications previously tried: lisinopril , amlodipine  10 mg and carvedilol  12.5 mg BID were recently discontinued  during 11/23/23 hospitalization - she put remaining supply to the side at home.   Home BP (since discharge): has not checked recently  Hyperlipidemia/ASCVD Risk Reduction  Current lipid lowering medications: atorvastatin  10 mg daily (found her bottle at home, and confirmed she is taking) Medications tried in the past: simvastatin  Antiplatelet regimen: aspirin  81 mg daily (confirmed taking)  ASCVD History: none. Normal echo (2018) and stress test (2022) Family History: Heart disease and aneurysm (brain) in Mother Risk Factors: T2DM, HTN  Clinical ASCVD: No  The ASCVD Risk score (Arnett DK, et al., 2019) failed to calculate for the following reasons:   The valid total cholesterol range is 130 to 320 mg/dL    Objective:  BP Readings from Last 3 Encounters:  12/11/23 139/76  11/27/23 98/73  10/23/23 (!) 140/88   Lab Results  Component Value Date   HGBA1C 8.0 (H) 11/24/2023   HGBA1C 10.7 (A) 10/23/2023   HGBA1C 14.4 (A) 07/24/2023    Lab Results  Component Value Date   CREATININE 1.09 (H) 12/11/2023   BUN 15 12/11/2023   NA 141 12/11/2023   K 4.0 12/11/2023   CL 106 12/11/2023   CO2 20 12/11/2023    Lab Results  Component Value Date   CHOL 98 (  L) 02/25/2023   HDL 48 02/25/2023   LDLCALC 35 02/25/2023   TRIG 71 02/25/2023   CHOLHDL 2.0 02/25/2023    Medications Reviewed Today     Reviewed by Brinda Lorain SQUIBB, RPH (Pharmacist) on 01/08/24 at 1303  Med List Status: <None>   Medication Order Taking? Sig Documenting Provider Last Dose Status Informant  albuterol  (VENTOLIN  HFA) 108 (90 Base) MCG/ACT inhaler [id number]  Inhale 2 puffs into the lungs every 6 (six) hours as needed for wheezing or shortness of breath. Zarwolo, Gloria, FNP  Active Self, Pharmacy Records  aspirin  EC 81 MG tablet [id number] Yes Take 81 mg by mouth daily. [provider]  Active Self, Pharmacy Records  atorvastatin  (LIPITOR) 10 MG tablet [id number] Yes Take 1 tablet (10 mg total) by mouth  daily. Paseda, Folashade R, FNP  Active Self, Pharmacy Records  benztropine  (COGENTIN ) 1 MG tablet [id number] Yes Take 1 tablet (1 mg total) by mouth at bedtime. Paseda, Folashade R, FNP  Active   Blood Glucose Monitoring Suppl (TRUE METRIX METER) w/Device KIT [id number]  Use as directed to monitor blood sugar. Paseda, Folashade R, FNP  Active Self, Pharmacy Records  Cholecalciferol (VITAMIN D -3) 125 MCG (5000 UT) TABS [id number]  Take 2 tablets by mouth daily. [name], Gebreselassie W, MD  Active Self, Pharmacy Records  clonazePAM  (KLONOPIN ) 0.5 MG tablet [id number] Yes Take 1 tablet (0.5 mg total) by mouth 2 (two) times daily as needed for anxiety. Paseda, Folashade R, FNP  Active   DULoxetine  (CYMBALTA ) 60 MG capsule [id number] Yes Take 1 capsule (60 mg total) by mouth 2 (two) times daily. Paseda, Folashade R, FNP  Active Self, Pharmacy Records  gabapentin  (NEURONTIN ) 300 MG capsule [id number] Yes Take 1 capsule (300 mg total) by mouth daily. Paseda, Folashade R, FNP  Active   Glucagon  (GVOKE HYPOPEN  2-PACK) 1 MG/0.2ML SOAJ [id number]  Inject 1 mg into the skin daily as needed. Paseda, Folashade R, FNP  Active   glucose blood (TRUE METRIX BLOOD GLUCOSE TEST) test strip [id number]  Use as instructed to monitor blood sugar twice daily Paseda, Folashade R, FNP  Active Self, Pharmacy Records  insulin  aspart (NOVOLOG ) 100 UNIT/ML FlexPen [id number] Yes Before each meal 3 times a day, 140-199 - 2 units, 200-250 - 4 units, 251-299 - 6 units,  300-349 - 8 units,  350 or above 10 units. Singh, Prashant K, MD  Active   Insulin  Glargine (BASAGLAR  [facility]) 100 UNIT/ML [id number] Yes Inject 17 Units into the skin 2 (two) times daily. May increase up to 50 units twice daily if instructed by your provider. Paseda, Folashade R, FNP  Active   Insulin  Pen Needle (PEN NEEDLES) 31G X 6 MM MISC [id number]  For 4 times a day insulin , 1 month supply. Diagnosis E11.65 Singh, Prashant K, MD  Active   levothyroxine  (SYNTHROID ) 75 MCG tablet  [id number] Yes Take 1 tablet (75 mcg total) by mouth daily before breakfast. Paseda, Folashade R, FNP  Active   losartan  (COZAAR ) 100 MG tablet [id number] Yes Take 1 tablet (100 mg total) by mouth daily. Paseda, Folashade R, FNP  Active   Multiple Vitamin (MULTIVITAMIN) tablet [id number]  Take 1 tablet by mouth daily. [provider]  Active Self, Pharmacy Records  risperiDONE  (RISPERDAL ) 2 MG tablet [id number] Yes TAKE 1 TABLET EVERY MORNING AND 3 TABLETS AT BEDTIME Paseda, Folashade R, FNP  Active   rizatriptan  (MAXALT ) 10 MG tablet [id number]  Take 1 tablet (10 mg total) by mouth as needed for migraine. May repeat  in 2 hours if needed Paseda, Folashade R, FNP  Active Self, Pharmacy Records  topiramate  (TOPAMAX ) 100 MG tablet [id number] Yes TAKE 1 TABLET BY MOUTH EVERYDAY AT BEDTIME Paseda, Folashade R, FNP  Active Self, Pharmacy Records  traZODone  (DESYREL ) 100 MG tablet [id number] Yes Take 2 tablets (200 mg total) by mouth at bedtime. Paseda, Folashade R, FNP  Active   TRUEplus Lancets 30G MISC [id number]  Use as directed to monitor blood sugar twice daily Paseda, Folashade R, FNP  Active Self, Pharmacy Records              Assessment/Plan:   Diabetes: - Currently uncontrolled with last A1C of 8.0% above goal < 7%, but improved from 14.4% in Feb 2025. SMBG indicate control has improved. Fortunately, she is not having hypoglycemia on Basaglar  17 units BID - however, I am concerned that she is frequently missing her AM dose and/or doses of Novolog  and that if she took all her insulin  as prescribed, she would have hypoglycemia. Patient was hesitant to make changes over the phone today. Communicated recommendations with PCP to implement at follow-up visit. Eventually, would like to reduce insulin  and restart GLP-1RA Trulicity  - as sliding scale prandial insulin  is not a preferred regimen.  - Reviewed long term cardiovascular and renal outcomes of uncontrolled blood sugar - Reviewed goal A1c,  goal fasting, and goal 2 hour post prandial glucose - Reviewed dietary modifications including reducing portion size of carbohydrate foods, increasing protein, increasing intake of non-starchy vegetables. - Reviewed lifestyle modifications including: increasing physical activity - Recommend to contniue Basaglar  17 units twice daily. Patient aware to notify clinic if she is having hypoglycemia with low BG < 70 mg/dL - Recommend to continue Novolog  3 times daily before meals per sliding scale (140-199 - 2 units, 200-250 - 4 units, 251-299 - 6 units, 300-349 - 8 units, 350 or above 10 units) - Patient denies personal or family history of multiple endocrine neoplasia type 2, medullary thyroid  cancer; personal history of pancreatitis or gallbladder disease. - Recommend to check glucose TID before meals (True Metrix meter and supplies for affordability) - Enrolled in [facility] program at [facility] through June 2026.     Hypertension: - Currently controlled with home BP previously at goal < 130/80 mmHg on losartan  100 mg daily alone. Recent clinic BP was slightly elevated. Continue to question daily adherence.  Scr and K were stable on 12/11/23 BMP (PCP appt).  - Reviewed long term cardiovascular and renal outcomes of uncontrolled blood pressure - Reviewed appropriate blood pressure monitoring technique and reviewed goal blood pressure. Recommended to check home blood pressure and heart rate once daily - Recommend to continue losartan  100 mg daily.     Hyperlipidemia/ASCVD Risk Reduction: - Currently controlled with LDL-C of 35 mg/dL below goal < 70 mg/dL on atorvastatin  10 mg daily. Recommend to continue moderate intensity statin.  - Reviewed long term complications of uncontrolled cholesterol - Recommend to continue atorvastatin  10 mg daily.      Follow Up Plan: PCP 01/29/24, Pharmacist telephone 02/12/24   Lorain Baseman, PharmD [facility] [phone]

## 2024-01-12 ENCOUNTER — Other Ambulatory Visit: Payer: Self-pay

## 2024-01-12 ENCOUNTER — Other Ambulatory Visit: Payer: Self-pay | Admitting: Nurse Practitioner

## 2024-01-12 DIAGNOSIS — F419 Anxiety disorder, unspecified: Secondary | ICD-10-CM

## 2024-01-12 MED ORDER — CLONAZEPAM 0.5 MG PO TABS
0.5000 mg | ORAL_TABLET | Freq: Two times a day (BID) | ORAL | 0 refills | Status: DC | PRN
  Filled 2024-01-12: qty 60, 30d supply, fill #0

## 2024-01-13 ENCOUNTER — Other Ambulatory Visit: Payer: Self-pay

## 2024-01-15 ENCOUNTER — Encounter: Payer: Self-pay | Admitting: Radiology

## 2024-01-18 ENCOUNTER — Ambulatory Visit: Payer: Self-pay | Admitting: Skilled Nursing Facility1

## 2024-01-18 ENCOUNTER — Other Ambulatory Visit: Payer: Self-pay

## 2024-01-20 ENCOUNTER — Other Ambulatory Visit: Payer: Self-pay

## 2024-01-21 ENCOUNTER — Other Ambulatory Visit: Payer: Self-pay

## 2024-01-22 ENCOUNTER — Telehealth: Payer: Self-pay

## 2024-01-22 ENCOUNTER — Encounter: Payer: Self-pay | Admitting: Dietician

## 2024-01-22 ENCOUNTER — Other Ambulatory Visit: Payer: Self-pay | Admitting: Nurse Practitioner

## 2024-01-22 ENCOUNTER — Other Ambulatory Visit: Payer: Self-pay

## 2024-01-22 ENCOUNTER — Encounter: Payer: Self-pay | Attending: Nurse Practitioner | Admitting: Dietician

## 2024-01-22 VITALS — Ht 60.0 in | Wt 157.0 lb

## 2024-01-22 DIAGNOSIS — Z794 Long term (current) use of insulin: Secondary | ICD-10-CM | POA: Insufficient documentation

## 2024-01-22 DIAGNOSIS — E1165 Type 2 diabetes mellitus with hyperglycemia: Secondary | ICD-10-CM | POA: Insufficient documentation

## 2024-01-22 MED ORDER — INSULIN LISPRO (1 UNIT DIAL) 100 UNIT/ML (KWIKPEN)
PEN_INJECTOR | SUBCUTANEOUS | 3 refills | Status: DC
  Filled 2024-01-22: qty 9, 30d supply, fill #0
  Filled 2024-02-19: qty 9, 30d supply, fill #1

## 2024-01-22 MED ORDER — PEN NEEDLES 31G X 6 MM MISC
0 refills | Status: AC
  Filled 2024-01-22 – 2024-02-29 (×2): qty 100, 25d supply, fill #0

## 2024-01-22 MED ORDER — BASAGLAR KWIKPEN 100 UNIT/ML ~~LOC~~ SOPN
17.0000 [IU] | PEN_INJECTOR | Freq: Two times a day (BID) | SUBCUTANEOUS | 4 refills | Status: DC
  Filled 2024-01-22: qty 12, 35d supply, fill #0
  Filled 2024-01-22: qty 12, 30d supply, fill #0

## 2024-01-22 NOTE — Progress Notes (Signed)
 I was contacted by Leita Constable, RD, CDCES who was seeing Kathryn [name] in clinic today for a nutrition appointment. Reported that patient ran out of insulin  yesterday and is needing a refill of basal and prandial insulin . Assisted in providing refills for Basaglar  and Humalog  which would be available at no charge to patient at [facility] pharmacy (located downstairs from the nutrition office). Leita relayed that patient is currently having BG > 300 mg/dL in the setting of running out of insulin . Denies recent hypoglycemia. Will have patient restart insulin  at current doses. PCP and pharmacy follow-up has been scheduled.   Lorain Baseman, PharmD [facility] [phone]

## 2024-01-22 NOTE — Progress Notes (Signed)
 Diabetes Self-Management Education  Visit Type: First/Initial  Appt. Start Time: 1000 (late) Appt. End Time: 1115  01/22/2024  Kathryn [name], identified by name and date of birth, is a 53 y.o. female with a diagnosis of Diabetes: Type 2.   ASSESSMENT Patient is here today with her husband. Fasting blood glucose 325 this am.  Her husband states that her blood glucose was 100-140 when she got out of the hospital but has been rising since and particularly high for the past week. Ran out of Basaglar  yesterday and does not have the money to refill this. She is followed by Lorain Baseman, Pharmacist and the RD communicated with her via phone and chat today. She is Enrolled in [facility] program at [facility] through June 2026 and gets her insulin  for free with this program at the [facility]. She was hospitalized 11/23/23-11/27/2023 for severe hypoglyemia. She has dysphagia related to narrowing esophagus and inability to chew foods well due to no teeth.  Referral:  Type 2 Diabetes (2011) (Paseda, FNP) Medications include:  Basaglar  17 units bid, Novolog  before each meal 3 times a day per sliding scale (140-199 - 2 units, 200-250 - 4 units, 251-299 - 6 units, 300-349 - 8 units, 350 or above 10 units), levothyroxine , Lipitor, Vitamin D , iron, MVI, see lsit History includes:  Type 2 Diabetes, Schizoaffective disorder, dysphagia, edentulous, HTN, HLD, hypothyroidism, GERD, IBS- constipation Labs:  A1C 8% 11/24/2023 decreased from 10.7% 10/23/2023, C-peptide was 4.4 11/24/2023, eGFR 61 on 12/11/2023  60 157 lbs 01/22/2024 164 lbs 12/11/2023  Patient lives with her husband.  He does the shopping and cooking.  She states that she has reapplied for disability. She struggles with sweet tea and regular soda. Increased amount of sleeping. Does not exercise.  Height 5' (1.524 m), weight 157 lb (71.2 kg), last menstrual period 01/23/2013. Body mass index is 30.66 kg/m.   Diabetes Self-Management Education -  01/22/24 1046       Visit Information   Visit Type First/Initial      Initial Visit   Diabetes Type Type 2    Are you taking your medications as prescribed? No   ran out of insulin      Psychosocial Assessment   Patient Belief/Attitude about Diabetes Other (comment)   many barriers   What is the hardest part about your diabetes right now, causing you the most concern, or is the most worrisome to you about your diabetes?   Making healty food and beverage choices;Being active    Self-care barriers Lack of material resources    Self-management support Doctor's office;CDE visits   pharmacist for population health   Other persons present Patient;Spouse/SO    Patient Concerns Nutrition/Meal planning;Glycemic Control    Special Needs None    Preferred Learning Style No preference indicated    Learning Readiness Ready    How often do you need to have someone help you when you read instructions, pamphlets, or other written materials from your doctor or pharmacy? 4 - Often    What is the last grade level you completed in school? 12      Pre-Education Assessment   Patient understands the diabetes disease and treatment process. Needs Instruction    Patient understands incorporating nutritional management into lifestyle. Needs Instruction    Patient undertands incorporating physical activity into lifestyle. Needs Instruction    Patient understands using medications safely. Needs Instruction    Patient understands monitoring blood glucose, interpreting and using results Needs Instruction  Patient understands prevention, detection, and treatment of acute complications. Needs Instruction    Patient understands prevention, detection, and treatment of chronic complications. Needs Instruction    Patient understands how to develop strategies to address psychosocial issues. Needs Instruction    Patient understands how to develop strategies to promote health/change behavior. Needs Instruction       Complications   Last HgB A1C per patient/outside source 8 %   11/24/2023 decreased from 10.7% 10/23/2023   How often do you check your blood sugar? 1-2 times/day    Fasting Blood glucose range (mg/dL) 869-820;819-799;>799    Postprandial Blood glucose range (mg/dL) >799    Number of hypoglycemic episodes per month 0      Dietary Intake   Breakfast NABS    Lunch none    Snack (afternoon) NABS    Dinner salad (cucumber, eggs, tomtoes, cheese, [place], lettuce/spinach, Systems developer) water       Activity / Exercise   Activity / Exercise Type ADL's      Patient Education   Previous Diabetes Education No    Healthy Eating Role of diet in the treatment of diabetes and the relationship between the three main macronutrients and blood glucose level;Meal options for control of blood glucose level and chronic complications.;Plate Method    Being Active Role of exercise on diabetes management, blood pressure control and cardiac health.    Medications Reviewed patients medication for diabetes, action, purpose, timing of dose and side effects.    Monitoring Taught/evaluated SMBG meter.;Identified appropriate SMBG and/or A1C goals.    Acute complications Taught prevention, symptoms, and  treatment of hypoglycemia - the 15 rule.;Discussed and identified patients' prevention, symptoms, and treatment of hyperglycemia.    Diabetes Stress and Support Identified and addressed patients feelings and concerns about diabetes;Worked with patient to identify barriers to care and solutions      Individualized Goals (developed by patient)   Nutrition General guidelines for healthy choices and portions discussed    Physical Activity Exercise 5-7 days per week;30 minutes per day    Medications take my medication as prescribed    Monitoring  Test my blood glucose as discussed    Problem Solving Eating Pattern;Addressing barriers to behavior change    Reducing Risk examine blood glucose patterns;treat  hypoglycemia with 15 grams of carbs if blood glucose less than 70mg /dL    Health Coping Ask for help with psychological, social, or emotional issues      Post-Education Assessment   Patient understands the diabetes disease and treatment process. Needs Review    Patient understands incorporating nutritional management into lifestyle. Needs Review    Patient undertands incorporating physical activity into lifestyle. Needs Review    Patient understands using medications safely. Needs Review    Patient understands monitoring blood glucose, interpreting and using results Needs Review    Patient understands prevention, detection, and treatment of acute complications. Needs Review    Patient understands prevention, detection, and treatment of chronic complications. Needs Review    Patient understands how to develop strategies to address psychosocial issues. Needs Review    Patient understands how to develop strategies to promote health/change behavior. Needs Review      Outcomes   Expected Outcomes Demonstrated interest in learning but significant barriers to change    Future DMSE 2 months    Program Status Not Completed          Individualized Plan for Diabetes Self-Management Training:   Learning Objective:  Patient  will have a greater understanding of diabetes self-management. Patient education plan is to attend individual and/or group sessions per assessed needs and concerns.   Plan:   Patient Instructions  Walk daily Drink plenty of water .  Medications: Be sure to take your medication consistently.  The doses may change but currently are those below. Basaglar  17 units bid, Humalog  before each meal 3 times a day per sliding scale (140-199 - 2 units, 200-250 - 4 units, 251-299 - 6 units, 300-349 - 8 units, 350 or above 10 units)  Expected Outcomes:  Demonstrated interest in learning but significant barriers to change  Education material provided: ADA - How to Thrive: A Guide for  Your Journey with Diabetes, Meal plan card, My Plate, Snack sheet, and Diabetes Resources, hypoglycemia, hyperglycemia  If problems or questions, patient to contact team via:  Phone  Future DSME appointment: 2 months

## 2024-01-22 NOTE — Patient Instructions (Addendum)
 Walk daily Drink plenty of water .  Medications: Be sure to take your medication consistently.  The doses may change but currently are those below. Basaglar  17 units bid, Humalog  before each meal 3 times a day per sliding scale (140-199 - 2 units, 200-250 - 4 units, 251-299 - 6 units, 300-349 - 8 units, 350 or above 10 units)

## 2024-01-26 ENCOUNTER — Other Ambulatory Visit (HOSPITAL_COMMUNITY)
Admission: RE | Admit: 2024-01-26 | Discharge: 2024-01-26 | Disposition: A | Discharge status: Home / Self Care | Source: Ambulatory Visit | Attending: Obstetrics & Gynecology | Admitting: Obstetrics & Gynecology

## 2024-01-26 ENCOUNTER — Encounter: Payer: Self-pay | Admitting: Obstetrics & Gynecology

## 2024-01-26 ENCOUNTER — Ambulatory Visit (INDEPENDENT_AMBULATORY_CARE_PROVIDER_SITE_OTHER): Payer: Self-pay | Admitting: Obstetrics & Gynecology

## 2024-01-26 VITALS — BP 150/94 | HR 94 | Ht 62.0 in | Wt 160.0 lb

## 2024-01-26 DIAGNOSIS — Z01419 Encounter for gynecological examination (general) (routine) without abnormal findings: Secondary | ICD-10-CM | POA: Insufficient documentation

## 2024-01-26 NOTE — Progress Notes (Signed)
 Subjective:     Kathryn [name] is a 53 y.o. female here for a routine exam.  Patient's last menstrual period was 01/23/2013. [id number] Birth Control Method:  menopausal Menstrual Calendar(currently): amenorrhea  Current complaints: none.   Current acute medical issues:  none   Recent Gynecologic History Patient's last menstrual period was 01/23/2013. Last Pap: 2022,  normal Last mammogram: 8/24,  normal  Past Medical History:  Diagnosis Date   Anemia    Anxiety    Asthma    Depression    GERD (gastroesophageal reflux disease)    HLD (hyperlipidemia) 04/07/2019   Hypertension    Hypothyroidism, adult 04/07/2019   Neuropathy    Obesity (BMI 30.0-34.9) 04/07/2019   Schizoaffective disorder (HCC)    Type II diabetes mellitus, uncontrolled 04/27/2019    Past Surgical History:  Procedure Laterality Date   BACK SURGERY  09/06/2020   BIOPSY  05/03/2021   Procedure: BIOPSY;  Surgeon: Eartha Angelia Sieving, MD;  [facility];  Service: Gastroenterology;;   BREAST SURGERY Right    biopsy   CESAREAN SECTION     CHOLECYSTECTOMY  06/02/2012   Procedure: LAPAROSCOPIC CHOLECYSTECTOMY;  Surgeon: Oneil DELENA Budge, MD;  Location: [facility];  Service: General;  Laterality: N/A;  Attempted laparoscopic cholecystectomy   CHOLECYSTECTOMY  06/02/2012   Procedure: CHOLECYSTECTOMY;  Surgeon: Oneil DELENA Budge, MD;  Location: [facility];  Service: General;  Laterality: N/A;  converted to open at  0905   COLONOSCOPY WITH PROPOFOL  N/A 07/18/2020   prep was fair, one 4mm polyp (inflammatory) stool in descending colon, transverse and ascending, distal rectum and anal verge normal   ESOPHAGEAL DILATION  12/31/2018   Procedure: ESOPHAGEAL DILATION;  Surgeon: Golda Claudis PENNER, MD;  [facility];  Service: Endoscopy;;   ESOPHAGOGASTRODUODENOSCOPY (EGD) WITH PROPOFOL  N/A 08/24/2015   Procedure: ESOPHAGOGASTRODUODENOSCOPY (EGD) WITH PROPOFOL ;  Surgeon: Claudis PENNER Golda, MD;  [facility];  Service: Endoscopy;  Laterality: N/A;  1:10 - Ann to notify pt to arrive at 11:30   ESOPHAGOGASTRODUODENOSCOPY (EGD) WITH PROPOFOL  N/A 12/31/2018   normal, no abnormality to explain dysphagia, esophagus dilated.   ESOPHAGOGASTRODUODENOSCOPY (EGD) WITH PROPOFOL  N/A 05/03/2021   Procedure: ESOPHAGOGASTRODUODENOSCOPY (EGD) WITH PROPOFOL ;  Surgeon: Eartha Angelia Sieving, MD;  [facility];  Service: Gastroenterology;  Laterality: N/A;  1:20   FLEXIBLE SIGMOIDOSCOPY  07/17/2020   Procedure: FLEXIBLE SIGMOIDOSCOPY;  Surgeon: Eartha Angelia Sieving, MD;  [facility];  Service: Gastroenterology;;   POLYPECTOMY  07/18/2020   Procedure: POLYPECTOMY INTESTINAL;  Surgeon: Eartha Angelia Sieving, MD;  [facility];  Service: Gastroenterology;;  ascending colon polyp;    SAVORY DILATION  05/03/2021   Procedure: SAVORY DILATION;  Surgeon: Eartha Angelia Sieving, MD;  [facility];  Service: Gastroenterology;;   tooth removal  2019   all teeth removed   TUBAL LIGATION     X3    OB History     Gravida  3   Para  3   Term  3   Preterm      AB      Living  3      SAB      IAB      Ectopic      Multiple      Live Births              Social History   Socioeconomic History   Marital status: Married    Spouse name: [name]  Number of children: 3   Years of education: 12   Highest education level: Not on file  Occupational History    Comment: [employer]    Comment: 3rd shift  Tobacco Use   Smoking status: Never    Passive exposure: Never   Smokeless tobacco: Never  Vaping Use   Vaping status: Never Used  Substance and Sexual Activity   Alcohol use: No    Alcohol/week: 0.0 standard drinks of alcohol   Drug use: No   Sexual activity: Not Currently    Birth control/protection: Surgical  Other Topics Concern   Not on file  Social History Narrative   Married for 22 years.On disability secondary to schizophrenia.    Lives with husband.   Caffeine- decaf coffee, tea   Social Drivers of Corporate investment banker Strain: Not on file  Food Insecurity: No Food Insecurity (12/03/2023)   Hunger Vital Sign    Worried About Running Out of Food in the Last Year: Never true    Ran Out of Food in the Last Year: Never true  Transportation Needs: No Transportation Needs (12/03/2023)   PRAPARE - Administrator, Civil Service (Medical): No    Lack of Transportation (Non-Medical): No  Physical Activity: Not on file  Stress: Not on file  Social Connections: Socially Isolated (11/24/2023)   Social Connection and Isolation Panel    Frequency of Communication with Friends and Family: Never    Frequency of Social Gatherings with Friends and Family: Never    Attends Religious Services: Never    Database administrator or Organizations: No    Attends Engineer, structural: Never    Marital Status: Married    Family History  Problem Relation Age of Onset   Hypertension Mother    Alcohol abuse Mother    Heart disease Mother    Kidney disease Mother    Aneurysm Mother        brain   Hypertension Father    Alcohol abuse Sister    Alcohol abuse Maternal Aunt    Cancer Maternal Aunt    Alcohol abuse Paternal Aunt    Alcohol abuse Maternal Grandmother    Alcohol abuse Maternal Grandfather    Hearing loss Daughter    Alcohol abuse Cousin    Stroke Other    Diabetes Other    Cancer Other    Seizures Other      Current Outpatient Medications:    acetaminophen  (TYLENOL ) 650 MG CR tablet, Take 650 mg by mouth every 8 (eight) hours as needed for pain., Disp: , Rfl:    albuterol  (VENTOLIN  HFA) 108 (90 Base) MCG/ACT inhaler, Inhale 2 puffs into the lungs every 6 (six) hours as needed for wheezing or shortness of breath., Disp: 8 g, Rfl: 2   aspirin  EC 81 MG tablet, Take 81 mg by mouth daily., Disp: , Rfl:    atorvastatin  (LIPITOR) 10 MG tablet, Take 1 tablet (10 mg total) by mouth daily., Disp: 90  tablet, Rfl: 2   benztropine  (COGENTIN ) 1 MG tablet, Take 1 tablet (1 mg total) by mouth at bedtime., Disp: 90 tablet, Rfl: 0   Blood Glucose Monitoring Suppl (TRUE METRIX METER) w/Device KIT, Use as directed to monitor blood sugar., Disp: 1 kit, Rfl: 0   Cholecalciferol (VITAMIN D -3) 125 MCG (5000 UT) TABS, Take 2 tablets by mouth daily., Disp: 30 tablet, Rfl: 1   clonazePAM  (KLONOPIN ) 0.5 MG tablet, Take 1 tablet (0.5 mg total) by  mouth 2 (two) times daily as needed for anxiety., Disp: 60 tablet, Rfl: 0   DULoxetine  (CYMBALTA ) 60 MG capsule, Take 1 capsule (60 mg total) by mouth 2 (two) times daily., Disp: 180 capsule, Rfl: 2   ferrous sulfate 325 (65 FE) MG tablet, Take 325 mg by mouth daily with breakfast., Disp: , Rfl:    gabapentin  (NEURONTIN ) 300 MG capsule, Take 1 capsule (300 mg total) by mouth daily., Disp: 90 capsule, Rfl: 2   Glucagon  (GVOKE HYPOPEN  2-PACK) 1 MG/0.2ML SOAJ, Inject 1 mg into the skin daily as needed., Disp: 0.2 mL, Rfl: 1   glucose blood (TRUE METRIX BLOOD GLUCOSE TEST) test strip, Use as instructed to monitor blood sugar twice daily, Disp: 50 each, Rfl: 11   Insulin  Glargine (BASAGLAR  KWIKPEN) 100 UNIT/ML, Inject 17 Units into the skin 2 (two) times daily. May increase to a maximum total daily dose of 40 units., Disp: 12 mL, Rfl: 4   insulin  lispro (HUMALOG ) 100 UNIT/ML KwikPen, Take before each meal 3 times a day, 140-199 - 2 units, 200-250 - 4 units, 251-299 - 6 units, 300-349 - 8 units, 350 or above 10 units, Disp: 15 mL, Rfl: 3   Insulin  Pen Needle (PEN NEEDLES) 31G X 6 MM MISC, For 4 times a day insulin , 1 month supply. Diagnosis E11.65, Disp: 100 each, Rfl: 0   levothyroxine  (SYNTHROID ) 75 MCG tablet, Take 1 tablet (75 mcg total) by mouth daily before breakfast., Disp: 90 tablet, Rfl: 1   losartan  (COZAAR ) 100 MG tablet, Take 1 tablet (100 mg total) by mouth daily., Disp: 90 tablet, Rfl: 3   Multiple Vitamin (MULTIVITAMIN) tablet, Take 1 tablet by mouth daily., Disp:  , Rfl:    risperiDONE  (RISPERDAL ) 2 MG tablet, TAKE 1 TABLET EVERY MORNING AND 3 TABLETS AT BEDTIME, Disp: 360 tablet, Rfl: 3   rizatriptan  (MAXALT ) 10 MG tablet, Take 1 tablet (10 mg total) by mouth as needed for migraine. May repeat in 2 hours if needed, Disp: 10 tablet, Rfl: 2   topiramate  (TOPAMAX ) 100 MG tablet, TAKE 1 TABLET BY MOUTH EVERYDAY AT BEDTIME, Disp: 90 tablet, Rfl: 1   traZODone  (DESYREL ) 100 MG tablet, Take 2 tablets (200 mg total) by mouth at bedtime., Disp: 180 tablet, Rfl: 2   TRUEplus Lancets 30G MISC, Use as directed to monitor blood sugar twice daily, Disp: 100 each, Rfl: 3  Review of Systems  Review of Systems  Constitutional: Negative for fever, chills, weight loss, malaise/fatigue and diaphoresis.  HENT: Negative for hearing loss, ear pain, nosebleeds, congestion, sore throat, neck pain, tinnitus and ear discharge.   Eyes: Negative for blurred vision, double vision, photophobia, pain, discharge and redness.  Respiratory: Negative for cough, hemoptysis, sputum production, shortness of breath, wheezing and stridor.   Cardiovascular: Negative for chest pain, palpitations, orthopnea, claudication, leg swelling and PND.  Gastrointestinal: negative for abdominal pain. Negative for heartburn, nausea, vomiting, diarrhea, constipation, blood in stool and melena.  Genitourinary: Negative for dysuria, urgency, frequency, hematuria and flank pain.  Musculoskeletal: Negative for myalgias, back pain, joint pain and falls.  Skin: Negative for itching and rash.  Neurological: Negative for dizziness, tingling, tremors, sensory change, speech change, focal weakness, seizures, loss of consciousness, weakness and headaches.  Endo/Heme/Allergies: Negative for environmental allergies and polydipsia. Does not bruise/bleed easily.  Psychiatric/Behavioral: Negative for depression, suicidal ideas, hallucinations, memory loss and substance abuse. The patient is not nervous/anxious and does not  have insomnia.        Objective:  Blood  pressure (!) 150/94, pulse 94, height 5' 2 (1.575 m), weight 160 lb (72.6 kg), last menstrual period 01/23/2013.   Physical Exam  Vitals reviewed. Constitutional: She is oriented to person, place, and time. She appears well-developed and well-nourished.  HENT:  Head: Normocephalic and atraumatic.        Right Ear: External ear normal.  Left Ear: External ear normal.  Nose: Nose normal.  Mouth/Throat: Oropharynx is clear and moist.  Eyes: Conjunctivae and EOM are normal. Pupils are equal, round, and reactive to light. Right eye exhibits no discharge. Left eye exhibits no discharge. No scleral icterus.  Neck: Normal range of motion. Neck supple. No tracheal deviation present. No thyromegaly present.  Cardiovascular: Normal rate, regular rhythm, normal heart sounds and intact distal pulses.  Exam reveals no gallop and no friction rub.   No murmur heard. Respiratory: Effort normal and breath sounds normal. No respiratory distress. She has no wheezes. She has no rales. She exhibits no tenderness.  GI: Soft. Bowel sounds are normal. She exhibits no distension and no mass. There is no tenderness. There is no rebound and no guarding.  Genitourinary:  Breasts no masses skin changes or nipple changes bilaterally      Vulva is normal without lesions Vagina is pink moist without discharge Cervix normal in appearance and pap is done Uterus is normal size shape and contour Adnexa is negative with normal sized ovaries   Musculoskeletal: Normal range of motion. She exhibits no edema and no tenderness.  Neurological: She is alert and oriented to person, place, and time. She has normal reflexes. She displays normal reflexes. No cranial nerve deficit. She exhibits normal muscle tone. Coordination normal.  Skin: Skin is warm and dry. No rash noted. No erythema. No pallor.  Psychiatric: She has a normal mood and affect. Her behavior is normal. Judgment and thought  content normal.       Medications Ordered at today's visit: No orders of the defined types were placed in this encounter.   Other orders placed at today's visit: No orders of the defined types were placed in this encounter.    ASSESSMENT + PLAN:    ICD-10-CM   1. Well woman exam with routine gynecological exam  Z01.419     2. Encounter for gynecological examination with Papanicolaou smear of cervix  Z01.419 Cytology - PAP( [place])          Return in about 3 years (around 01/26/2027) for yearly.

## 2024-01-28 ENCOUNTER — Other Ambulatory Visit: Payer: Self-pay

## 2024-01-28 LAB — CYTOLOGY - PAP
Chlamydia: NEGATIVE
Comment: NEGATIVE
Comment: NEGATIVE
Comment: NORMAL
Diagnosis: NEGATIVE
High risk HPV: NEGATIVE
Neisseria Gonorrhea: NEGATIVE

## 2024-01-29 ENCOUNTER — Ambulatory Visit (INDEPENDENT_AMBULATORY_CARE_PROVIDER_SITE_OTHER): Payer: Self-pay | Admitting: Nurse Practitioner

## 2024-01-29 ENCOUNTER — Ambulatory Visit (HOSPITAL_COMMUNITY)
Admission: EM | Admit: 2024-01-29 | Discharge: 2024-01-30 | Disposition: A | Discharge status: Other Acute Inpatient Hospital | Payer: Self-pay

## 2024-01-29 ENCOUNTER — Encounter (HOSPITAL_COMMUNITY): Payer: Self-pay

## 2024-01-29 ENCOUNTER — Encounter: Payer: Self-pay | Admitting: Nurse Practitioner

## 2024-01-29 ENCOUNTER — Other Ambulatory Visit: Payer: Self-pay

## 2024-01-29 VITALS — BP 119/75 | HR 98 | Temp 96.0°F | Wt 156.0 lb

## 2024-01-29 DIAGNOSIS — Z9151 Personal history of suicidal behavior: Secondary | ICD-10-CM | POA: Diagnosis not present

## 2024-01-29 DIAGNOSIS — E039 Hypothyroidism, unspecified: Secondary | ICD-10-CM | POA: Insufficient documentation

## 2024-01-29 DIAGNOSIS — E1140 Type 2 diabetes mellitus with diabetic neuropathy, unspecified: Secondary | ICD-10-CM | POA: Insufficient documentation

## 2024-01-29 DIAGNOSIS — I10 Essential (primary) hypertension: Secondary | ICD-10-CM | POA: Diagnosis not present

## 2024-01-29 DIAGNOSIS — F209 Schizophrenia, unspecified: Secondary | ICD-10-CM

## 2024-01-29 DIAGNOSIS — Z794 Long term (current) use of insulin: Secondary | ICD-10-CM | POA: Insufficient documentation

## 2024-01-29 DIAGNOSIS — F251 Schizoaffective disorder, depressive type: Secondary | ICD-10-CM | POA: Insufficient documentation

## 2024-01-29 DIAGNOSIS — Z6379 Other stressful life events affecting family and household: Secondary | ICD-10-CM | POA: Diagnosis not present

## 2024-01-29 DIAGNOSIS — Z79899 Other long term (current) drug therapy: Secondary | ICD-10-CM | POA: Insufficient documentation

## 2024-01-29 DIAGNOSIS — R45851 Suicidal ideations: Secondary | ICD-10-CM | POA: Diagnosis not present

## 2024-01-29 DIAGNOSIS — E1165 Type 2 diabetes mellitus with hyperglycemia: Secondary | ICD-10-CM

## 2024-01-29 DIAGNOSIS — F2089 Other schizophrenia: Secondary | ICD-10-CM | POA: Diagnosis present

## 2024-01-29 LAB — URINALYSIS, ROUTINE W REFLEX MICROSCOPIC
Bilirubin Urine: NEGATIVE
Glucose, UA: NEGATIVE mg/dL
Hgb urine dipstick: NEGATIVE
Ketones, ur: NEGATIVE mg/dL
Nitrite: NEGATIVE
Protein, ur: NEGATIVE mg/dL
Specific Gravity, Urine: 1.015 (ref 1.005–1.030)
pH: 5 (ref 5.0–8.0)

## 2024-01-29 LAB — COMPREHENSIVE METABOLIC PANEL WITH GFR
ALT: 15 U/L (ref 0–44)
AST: 12 U/L — ABNORMAL LOW (ref 15–41)
Albumin: 4.2 g/dL (ref 3.5–5.0)
Alkaline Phosphatase: 72 U/L (ref 38–126)
Anion gap: 11 (ref 5–15)
BUN: 22 mg/dL — ABNORMAL HIGH (ref 6–20)
CO2: 23 mmol/L (ref 22–32)
Calcium: 10 mg/dL (ref 8.9–10.3)
Chloride: 104 mmol/L (ref 98–111)
Creatinine, Ser: 1.05 mg/dL — ABNORMAL HIGH (ref 0.44–1.00)
GFR, Estimated: >60 mL/min (ref >60)
Glucose, Bld: 264 mg/dL — ABNORMAL HIGH (ref 70–99)
Potassium: 3.8 mmol/L (ref 3.5–5.1)
Sodium: 138 mmol/L (ref 135–145)
Total Bilirubin: 1.2 mg/dL (ref 0.0–1.2)
Total Protein: 8.1 g/dL (ref 6.5–8.1)

## 2024-01-29 LAB — TSH: TSH: 0.228 u[IU]/mL — ABNORMAL LOW (ref 0.350–4.500)

## 2024-01-29 LAB — CBC WITH DIFFERENTIAL/PLATELET
Abs Immature Granulocytes: 0.01 K/uL (ref 0.00–0.07)
Basophils Absolute: 0.1 K/uL (ref 0.0–0.1)
Basophils Relative: 1 %
Eosinophils Absolute: 0.1 K/uL (ref 0.0–0.5)
Eosinophils Relative: 2 %
HCT: 36.3 % (ref 36.0–46.0)
Hemoglobin: 12.1 g/dL (ref 12.0–15.0)
Immature Granulocytes: 0 %
Lymphocytes Relative: 44 %
Lymphs Abs: 2.7 K/uL (ref 0.7–4.0)
MCH: 28.4 pg (ref 26.0–34.0)
MCHC: 33.3 g/dL (ref 30.0–36.0)
MCV: 85.2 fL (ref 80.0–100.0)
Monocytes Absolute: 0.3 K/uL (ref 0.1–1.0)
Monocytes Relative: 5 %
Neutro Abs: 3 K/uL (ref 1.7–7.7)
Neutrophils Relative %: 48 %
Platelets: 306 K/uL (ref 150–400)
RBC: 4.26 MIL/uL (ref 3.87–5.11)
RDW: 12 % (ref 11.5–15.5)
WBC: 6.1 K/uL (ref 4.0–10.5)
nRBC: 0 % (ref 0.0–0.2)

## 2024-01-29 LAB — POCT URINE DRUG SCREEN - MANUAL ENTRY (I-SCREEN)
POC Amphetamine UR: NOT DETECTED
POC Buprenorphine (BUP): NOT DETECTED
POC Cocaine UR: NOT DETECTED
POC Marijuana UR: NOT DETECTED
POC Methadone UR: NOT DETECTED
POC Methamphetamine UR: NOT DETECTED
POC Morphine: NOT DETECTED
POC Oxazepam (BZO): NOT DETECTED
POC Oxycodone UR: NOT DETECTED
POC Secobarbital (BAR): NOT DETECTED

## 2024-01-29 LAB — LIPID PANEL
Cholesterol: 111 mg/dL (ref 0–200)
HDL: 57 mg/dL (ref >40)
LDL Cholesterol: 39 mg/dL (ref 0–99)
Total CHOL/HDL Ratio: 1.9 ratio
Triglycerides: 76 mg/dL (ref <150)
VLDL: 15 mg/dL (ref 0–40)

## 2024-01-29 LAB — HEMOGLOBIN A1C
Hgb A1c MFr Bld: 8.5 % — ABNORMAL HIGH (ref 4.8–5.6)
Mean Plasma Glucose: 197.25 mg/dL

## 2024-01-29 LAB — GLUCOSE, CAPILLARY: Glucose-Capillary: 305 mg/dL — ABNORMAL HIGH (ref 70–99)

## 2024-01-29 LAB — MAGNESIUM: Magnesium: 1.9 mg/dL (ref 1.7–2.4)

## 2024-01-29 LAB — POC URINE PREG, ED: Preg Test, Ur: NEGATIVE

## 2024-01-29 LAB — ETHANOL: Alcohol, Ethyl (B): <15 mg/dL (ref <15)

## 2024-01-29 MED ORDER — LORAZEPAM 2 MG/ML IJ SOLN: 2.0000 mg | Freq: Three times a day (TID) | INTRAMUSCULAR | Status: DC | PRN

## 2024-01-29 MED ORDER — HALOPERIDOL 5 MG PO TABS
5.0000 mg | ORAL_TABLET | Freq: Three times a day (TID) | ORAL | Status: DC | PRN
  Administered 2024-01-29: 5 mg via ORAL
  Filled 2024-01-29: qty 1

## 2024-01-29 MED ORDER — HALOPERIDOL LACTATE 5 MG/ML IJ SOLN: 10.0000 mg | Freq: Three times a day (TID) | INTRAMUSCULAR | Status: DC | PRN

## 2024-01-29 MED ORDER — ALUM & MAG HYDROXIDE-SIMETH 200-200-20 MG/5ML PO SUSP: 30.0000 mL | ORAL | Status: DC | PRN

## 2024-01-29 MED ORDER — INSULIN ASPART 100 UNIT/ML IJ SOLN
0.0000 [IU] | Freq: Three times a day (TID) | INTRAMUSCULAR | Status: DC
  Administered 2024-01-30: 15 [IU] via SUBCUTANEOUS
  Administered 2024-01-30: 8 [IU] via SUBCUTANEOUS

## 2024-01-29 MED ORDER — DIPHENHYDRAMINE HCL 50 MG/ML IJ SOLN: 50.0000 mg | Freq: Three times a day (TID) | INTRAMUSCULAR | Status: DC | PRN

## 2024-01-29 MED ORDER — ACETAMINOPHEN 325 MG PO TABS: 650.0000 mg | ORAL_TABLET | Freq: Four times a day (QID) | ORAL | Status: DC | PRN

## 2024-01-29 MED ORDER — DIPHENHYDRAMINE HCL 50 MG PO CAPS
50.0000 mg | ORAL_CAPSULE | Freq: Three times a day (TID) | ORAL | Status: DC | PRN
  Administered 2024-01-29: 50 mg via ORAL
  Filled 2024-01-29: qty 1

## 2024-01-29 MED ORDER — MAGNESIUM HYDROXIDE 400 MG/5ML PO SUSP: 30.0000 mL | Freq: Every day | ORAL | Status: DC | PRN

## 2024-01-29 MED ORDER — BASAGLAR KWIKPEN 100 UNIT/ML ~~LOC~~ SOPN
19.0000 [IU] | PEN_INJECTOR | Freq: Two times a day (BID) | SUBCUTANEOUS | 4 refills | Status: DC
Start: 2024-01-29 — End: 2024-02-17
  Filled 2024-01-29: qty 12, 32d supply, fill #0

## 2024-01-29 MED ORDER — HALOPERIDOL LACTATE 5 MG/ML IJ SOLN: 5.0000 mg | Freq: Three times a day (TID) | INTRAMUSCULAR | Status: DC | PRN

## 2024-01-29 MED ORDER — RISPERIDONE 2 MG PO TABS
2.0000 mg | ORAL_TABLET | Freq: Two times a day (BID) | ORAL | Status: DC
Start: 2024-01-29 — End: 2024-01-30
  Administered 2024-01-29 – 2024-01-30 (×2): 2 mg via ORAL
  Filled 2024-01-29 (×2): qty 1

## 2024-01-29 MED ORDER — INSULIN ASPART 100 UNIT/ML IJ SOLN
0.0000 [IU] | Freq: Every day | INTRAMUSCULAR | Status: DC
  Administered 2024-01-29: 4 [IU] via SUBCUTANEOUS

## 2024-01-29 MED ORDER — RISPERIDONE 1 MG PO TBDP
1.0000 mg | ORAL_TABLET | Freq: Once | ORAL | Status: AC
  Administered 2024-01-29: 1 mg via ORAL
  Filled 2024-01-29: qty 1

## 2024-01-29 NOTE — Patient Instructions (Addendum)
 1. Uncontrolled type 2 diabetes mellitus with hyperglycemia, with long-term current use of insulin  (HCC) (Primary)  - Insulin  Glargine (BASAGLAR  KWIKPEN) 100 UNIT/ML; Inject 19 Units into the skin 2 (two) times daily.   Goal for fasting blood sugar ranges from 80 to 120 and 2 hours after any meal or at bedtime should be between 130 to 170.     It is important that you exercise regularly at least 30 minutes 5 times a week as tolerated  Think about what you will eat, plan ahead. Choose  clean, green, fresh or frozen over canned, processed or packaged foods which are more sugary, salty and fatty. 70 to 75% of food eaten should be vegetables and fruit. Three meals at set times with snacks allowed between meals, but they must be fruit or vegetables. Aim to eat over a 12 hour period , example 7 am to 7 pm, and STOP after  your last meal of the day. Drink water ,generally about 64 ounces per day, no other drink is as healthy. Fruit juice is best enjoyed in a healthy way, by EATING the fruit.  Thanks for choosing [facility] we consider it a privelige to serve you.
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
## 2024-01-29 NOTE — ED Notes (Signed)
 Patient currently sleeping and resting in recliner. RR even and unlabored, appearing in no noted distress. Environmental check complete

## 2024-01-29 NOTE — Assessment & Plan Note (Signed)
 BP Readings from Last 3 Encounters:  01/29/24 119/75  01/26/24 (!) 150/94  12/11/23 139/76  Currently well-controlled on Losartan  100 mg daily.  Current medication

## 2024-01-29 NOTE — Progress Notes (Signed)
 SABRA

## 2024-01-29 NOTE — Assessment & Plan Note (Addendum)
   Type 2 diabetes mellitus with hyperglycemia Blood glucose levels 300-400 mg/dL pre-meals despite medication adherence. Current regimen includes sliding scale Humalog  and Basaglar  17 units twice daily. Uncontrolled glucose levels indicate need for medication adjustment. LaynaFox, clinical pharmacy, involved for management. - Increase Basaglar  to 19 units twice daily. - Continue current sliding scale for Humalog . - Schedule follow-up to check A1c in one month. - Coordinate with clinical pharmacy for ongoing management and potential medication adjustments.

## 2024-01-29 NOTE — Progress Notes (Signed)
 Established Patient Office Visit  Subjective:  Patient ID: Kathryn [name], female    DOB: May 04, 1971  Age: 53 y.o. MRN: [id number]  CC:  Chief Complaint  Patient presents with   Hypertension   Diabetes    HPI   Discussed the use of AI scribe software for clinical note transcription with the patient, who gave verbal consent to proceed.  History of Present Illness Kathryn [name] is a 53 year old female  has a past medical history of Anemia, Anxiety, Asthma, Depression, GERD (gastroesophageal reflux disease), HLD (hyperlipidemia) (04/07/2019), Hypertension, Hypothyroidism, adult (04/07/2019), Neuropathy, Obesity (BMI 30.0-34.9) (04/07/2019), Schizoaffective disorder (HCC), and Type II diabetes mellitus, uncontrolled (04/27/2019).  who presents with suicidal ideation.  She experiences suicidal ideation, expressing a desire to die and having 'snapped' on her husband. She attributes these feelings to stress related to her daughter being incarcerated. She has no specific plans to harm herself.  She has a history of diabetes with blood sugar levels ranging from 360 to 405 mg/dL before meals. She is on Basaglar , taking 17 units twice daily, and uses a sliding scale for Humalog . She is compliant with her medication regimen. She attempts to walk for 30 minutes daily with her husband's encouragement.  She has a history of hypertension and is currently taking losartan  100 mg daily, although she previously reported taking 25 mg. She is unsure of the exact dosage she is currently taking at home.  She works the 3rd shift at [employer] and is currently seeking long-term disability, having engaged a Regulatory affairs officer.  The patient's husband later joined the patient during the visit today   Assessment & Plan :    Lab Results  Component Value Date   HGBA1C 8.5 (H) 01/29/2024    Past Medical History:  Diagnosis Date   Anemia    Anxiety    Asthma    Depression    GERD (gastroesophageal reflux  disease)    HLD (hyperlipidemia) 04/07/2019   Hypertension    Hypothyroidism, adult 04/07/2019   Neuropathy    Obesity (BMI 30.0-34.9) 04/07/2019   Schizoaffective disorder (HCC)    Type II diabetes mellitus, uncontrolled 04/27/2019    Past Surgical History:  Procedure Laterality Date   BACK SURGERY  09/06/2020   BIOPSY  05/03/2021   Procedure: BIOPSY;  Surgeon: Eartha Angelia Sieving, MD;  [facility]: [facility];  Service: Gastroenterology;;   BREAST SURGERY Right    biopsy   CESAREAN SECTION     CHOLECYSTECTOMY  06/02/2012   Procedure: LAPAROSCOPIC CHOLECYSTECTOMY;  Surgeon: Oneil DELENA Budge, MD;  Location: [facility];  Service: General;  Laterality: N/A;  Attempted laparoscopic cholecystectomy   CHOLECYSTECTOMY  06/02/2012   Procedure: CHOLECYSTECTOMY;  Surgeon: Oneil DELENA Budge, MD;  Location: [facility];  Service: General;  Laterality: N/A;  converted to open at  [id number]   COLONOSCOPY WITH PROPOFOL  N/A 07/18/2020   prep was fair, one 4mm polyp (inflammatory) stool in descending colon, transverse and ascending, distal rectum and anal verge normal   ESOPHAGEAL DILATION  12/31/2018   Procedure: ESOPHAGEAL DILATION;  Surgeon: Golda Claudis PENNER, MD;  [facility]: [facility];  Service: Endoscopy;;   ESOPHAGOGASTRODUODENOSCOPY (EGD) WITH PROPOFOL  N/A 08/24/2015   Procedure: ESOPHAGOGASTRODUODENOSCOPY (EGD) WITH PROPOFOL ;  Surgeon: Claudis PENNER Golda, MD;  Location: [facility];  Service: Endoscopy;  Laterality: N/A;  1:10 - Ann to notify pt to arrive at 11:30   ESOPHAGOGASTRODUODENOSCOPY (EGD) WITH PROPOFOL  N/A 12/31/2018   normal, no abnormality to explain dysphagia,  esophagus dilated.   ESOPHAGOGASTRODUODENOSCOPY (EGD) WITH PROPOFOL  N/A 05/03/2021   Procedure: ESOPHAGOGASTRODUODENOSCOPY (EGD) WITH PROPOFOL ;  Surgeon: Eartha Angelia Sieving, MD;  [facility]: [facility];  Service: Gastroenterology;  Laterality: N/A;  1:20   FLEXIBLE SIGMOIDOSCOPY  07/17/2020   Procedure: FLEXIBLE SIGMOIDOSCOPY;   Surgeon: Eartha Angelia, Sieving, MD;  [facility]: [facility];  Service: Gastroenterology;;   POLYPECTOMY  07/18/2020   Procedure: POLYPECTOMY INTESTINAL;  Surgeon: Eartha Angelia, Sieving, MD;  [facility]: [facility];  Service: Gastroenterology;;  ascending colon polyp;    SAVORY DILATION  05/03/2021   Procedure: SAVORY DILATION;  Surgeon: Eartha Angelia Sieving, MD;  [facility]: [facility];  Service: Gastroenterology;;   tooth removal  2019   all teeth removed   TUBAL LIGATION     X3    Family History  Problem Relation Age of Onset   Hypertension Mother    Alcohol abuse Mother    Heart disease Mother    Kidney disease Mother    Aneurysm Mother        brain   Hypertension Father    Alcohol abuse Sister    Alcohol abuse Maternal Aunt    Cancer Maternal Aunt    Alcohol abuse Paternal Aunt    Alcohol abuse Maternal Grandmother    Alcohol abuse Maternal Grandfather    Hearing loss Daughter    Alcohol abuse Cousin    Stroke Other    Diabetes Other    Cancer Other    Seizures Other     Social History   Socioeconomic History   Marital status: Married    Spouse name: [name]   Number of children: 3   Years of education: 12   Highest education level: Not on file  Occupational History    Comment: [employer]    Comment: 3rd shift  Tobacco Use   Smoking status: Never    Passive exposure: Never   Smokeless tobacco: Never  Vaping Use   Vaping status: Never Used  Substance and Sexual Activity   Alcohol use: No    Alcohol/week: 0.0 standard drinks of alcohol   Drug use: No   Sexual activity: Not Currently    Birth control/protection: Surgical  Other Topics Concern   Not on file  Social History Narrative   Married for 22 years.On disability secondary to schizophrenia.   Lives with husband.   Caffeine- decaf coffee, tea   Social Drivers of Corporate investment banker Strain: Not on file  Food Insecurity: Food Insecurity Present (01/29/2024)   Hunger Vital Sign     Worried About Running Out of Food in the Last Year: Sometimes true    Ran Out of Food in the Last Year: Sometimes true  Transportation Needs: No Transportation Needs (01/29/2024)   PRAPARE - Administrator, Civil Service (Medical): No    Lack of Transportation (Non-Medical): No  Physical Activity: Not on file  Stress: Not on file  Social Connections: Socially Isolated (11/24/2023)   Social Connection and Isolation Panel    Frequency of Communication with Friends and Family: Never    Frequency of Social Gatherings with Friends and Family: Never    Attends Religious Services: Never    Database administrator or Organizations: No    Attends Banker Meetings: Never    Marital Status: Married  Catering manager Violence: Not At Risk (01/29/2024)   Humiliation, Afraid, Rape, and Kick questionnaire    Fear of Current or Ex-Partner: No  Emotionally Abused: No    Physically Abused: No    Sexually Abused: No    No facility-administered medications prior to visit.   Outpatient Medications Prior to Visit  Medication Sig Dispense Refill   acetaminophen  (TYLENOL ) 650 MG CR tablet Take 650 mg by mouth every 8 (eight) hours as needed for pain.     albuterol  (VENTOLIN  HFA) 108 (90 Base) MCG/ACT inhaler Inhale 2 puffs into the lungs every 6 (six) hours as needed for wheezing or shortness of breath. 8 g 2   aspirin  EC 81 MG tablet Take 81 mg by mouth daily.     atorvastatin  (LIPITOR) 10 MG tablet Take 1 tablet (10 mg total) by mouth daily. 90 tablet 2   benztropine  (COGENTIN ) 1 MG tablet Take 1 tablet (1 mg total) by mouth at bedtime. 90 tablet 0   Blood Glucose Monitoring Suppl (TRUE METRIX METER) w/Device KIT Use as directed to monitor blood sugar. 1 kit 0   Cholecalciferol (VITAMIN D -3) 125 MCG (5000 UT) TABS Take 2 tablets by mouth daily. (Patient taking differently: Take 10,000 Units by mouth daily.) 30 tablet 1   clonazePAM  (KLONOPIN ) 0.5 MG tablet Take 1 tablet (0.5 mg total)  by mouth 2 (two) times daily as needed for anxiety. 60 tablet 0   DULoxetine  (CYMBALTA ) 60 MG capsule Take 1 capsule (60 mg total) by mouth 2 (two) times daily. 180 capsule 2   ferrous sulfate 325 (65 FE) MG tablet Take 325 mg by mouth daily with breakfast.     gabapentin  (NEURONTIN ) 300 MG capsule Take 1 capsule (300 mg total) by mouth daily. 90 capsule 2   Glucagon  (GVOKE HYPOPEN  2-PACK) 1 MG/0.2ML SOAJ Inject 1 mg into the skin daily as needed. 0.2 mL 1   glucose blood (TRUE METRIX BLOOD GLUCOSE TEST) test strip Use as instructed to monitor blood sugar twice daily 50 each 11   insulin  lispro (HUMALOG ) 100 UNIT/ML KwikPen Take before each meal 3 times a day, 140-199 - 2 units, 200-250 - 4 units, 251-299 - 6 units, 300-349 - 8 units, 350 or above 10 units (Patient taking differently: Inject 2-10 Units into the skin 3 (three) times daily before meals. Take before each meal 3 times a day, 140-199 - 2 units, 200-250 - 4 units, 251-299 - 6 units, 300-349 - 8 units, 350 or above 10 units) 15 mL 3   Insulin  Pen Needle (PEN NEEDLES) 31G X 6 MM MISC For 4 times a day insulin , 1 month supply. Diagnosis E11.65 100 each 0   levothyroxine  (SYNTHROID ) 75 MCG tablet Take 1 tablet (75 mcg total) by mouth daily before breakfast. 90 tablet 1   losartan  (COZAAR ) 100 MG tablet Take 1 tablet (100 mg total) by mouth daily. 90 tablet 3   Multiple Vitamin (MULTIVITAMIN) tablet Take 1 tablet by mouth daily.     risperiDONE  (RISPERDAL ) 2 MG tablet TAKE 1 TABLET EVERY MORNING AND 3 TABLETS AT BEDTIME (Patient taking differently: Take 2-6 mg by mouth in the morning and at bedtime. Take one tablet every morning and three tablets at bedtime) 360 tablet 3   rizatriptan  (MAXALT ) 10 MG tablet Take 1 tablet (10 mg total) by mouth as needed for migraine. May repeat in 2 hours if needed 10 tablet 2   topiramate  (TOPAMAX ) 100 MG tablet TAKE 1 TABLET BY MOUTH EVERYDAY AT BEDTIME (Patient taking differently: Take 100 mg by mouth at  bedtime.) 90 tablet 1   traZODone  (DESYREL ) 100 MG tablet Take 2 tablets (  200 mg total) by mouth at bedtime. (Patient taking differently: Take 200 mg by mouth at bedtime as needed for sleep.) 180 tablet 2   TRUEplus Lancets 30G MISC Use as directed to monitor blood sugar twice daily 100 each 3   Insulin  Glargine (BASAGLAR  KWIKPEN) 100 UNIT/ML Inject 17 Units into the skin 2 (two) times daily. May increase to a maximum total daily dose of 40 units. 12 mL 4    Allergies  Allergen Reactions   Metformin Hcl Diarrhea    ROS Review of Systems  Constitutional:  Negative for appetite change, chills, fatigue and fever.  HENT:  Negative for congestion, dental problem and drooling.   Respiratory:  Negative for cough, shortness of breath and wheezing.   Cardiovascular:  Negative for chest pain, palpitations and leg swelling.  Gastrointestinal:  Negative for abdominal pain, constipation, nausea and vomiting.  Genitourinary:  Negative for difficulty urinating, dysuria, flank pain and frequency.  Musculoskeletal:  Negative for arthralgias, back pain, joint swelling and myalgias.  Skin:  Negative for color change, pallor, rash and wound.  Neurological:  Negative for tremors, facial asymmetry, weakness and numbness.  Psychiatric/Behavioral:  Positive for suicidal ideas. Negative for behavioral problems and confusion.       Objective:    Physical Exam Vitals and nursing note reviewed.  Constitutional:      General: She is not in acute distress.    Appearance: Normal appearance. She is not ill-appearing, toxic-appearing or diaphoretic.  Cardiovascular:     Rate and Rhythm: Normal rate and regular rhythm.     Pulses: Normal pulses.     Heart sounds: Normal heart sounds. No murmur heard.    No friction rub. No gallop.  Pulmonary:     Effort: Pulmonary effort is normal. No respiratory distress.     Breath sounds: Normal breath sounds. No stridor. No wheezing, rhonchi or rales.  Chest:     Chest  wall: No tenderness.  Abdominal:     General: There is no distension.     Palpations: Abdomen is soft.     Tenderness: There is no abdominal tenderness. There is no right CVA tenderness, left CVA tenderness or guarding.  Musculoskeletal:        General: No swelling, tenderness, deformity or signs of injury.     Right lower leg: No edema.     Left lower leg: No edema.  Skin:    General: Skin is warm and dry.     Capillary Refill: Capillary refill takes less than 2 seconds.     Coloration: Skin is not jaundiced or pale.     Findings: No bruising, erythema or lesion.  Neurological:     Mental Status: She is alert and oriented to person, place, and time.     Motor: No weakness.     Gait: Gait normal.  Psychiatric:        Mood and Affect: Mood normal.        Behavior: Behavior normal.        Thought Content: Thought content normal.        Judgment: Judgment normal.     BP 119/75   Pulse 98   Temp (!) 96 F (35.6 C)   Wt 156 lb (70.8 kg)   LMP 01/23/2013   SpO2 100%   BMI 28.53 kg/m  Wt Readings from Last 3 Encounters:  01/29/24 156 lb (70.8 kg)  01/26/24 160 lb (72.6 kg)  01/22/24 157 lb (71.2 kg)    Lab  Results  Component Value Date   TSH 0.228 (L) 01/29/2024   Lab Results  Component Value Date   WBC 6.1 01/29/2024   HGB 12.1 01/29/2024   HCT 36.3 01/29/2024   MCV 85.2 01/29/2024   PLT 306 01/29/2024   Lab Results  Component Value Date   NA 138 01/29/2024   K 3.8 01/29/2024   CO2 23 01/29/2024   GLUCOSE 264 (H) 01/29/2024   BUN 22 (H) 01/29/2024   CREATININE 1.05 (H) 01/29/2024   BILITOT 1.2 01/29/2024   ALKPHOS 72 01/29/2024   AST 12 (L) 01/29/2024   ALT 15 01/29/2024   PROT 8.1 01/29/2024   ALBUMIN 4.2 01/29/2024   CALCIUM  10.0 01/29/2024   ANIONGAP 11 01/29/2024   EGFR 61 12/11/2023   Lab Results  Component Value Date   CHOL 111 01/29/2024   Lab Results  Component Value Date   HDL 57 01/29/2024   Lab Results  Component Value Date    LDLCALC 39 01/29/2024   Lab Results  Component Value Date   TRIG 76 01/29/2024   Lab Results  Component Value Date   CHOLHDL 1.9 01/29/2024   Lab Results  Component Value Date   HGBA1C 8.5 (H) 01/29/2024      Assessment & Plan:   Problem List Items Addressed This Visit       Cardiovascular and Mediastinum   Essential hypertension, benign   BP Readings from Last 3 Encounters:  01/29/24 119/75  01/26/24 (!) 150/94  12/11/23 139/76  Currently well-controlled on Losartan  100 mg daily.  Current medication        Endocrine   Uncontrolled type 2 diabetes mellitus with hyperglycemia, with long-term current use of insulin  (HCC) - Primary     Type 2 diabetes mellitus with hyperglycemia Blood glucose levels 300-400 mg/dL pre-meals despite medication adherence. Current regimen includes sliding scale Humalog  and Basaglar  17 units twice daily. Uncontrolled glucose levels indicate need for medication adjustment. LaynaFox, clinical pharmacy, involved for management. - Increase Basaglar  to 19 units twice daily. - Continue current sliding scale for Humalog . - Schedule follow-up to check A1c in one month. - Coordinate with clinical pharmacy for ongoing management and potential medication adjustments.        Relevant Medications   Insulin  Glargine (BASAGLAR  KWIKPEN) 100 UNIT/ML     Other   Suicidal ideation    She reported feeling overwhelmed with thoughts of wanting to die due to stress from her daughter's incarceration. Denied active self-harm plans but acknowledged distress. Immediate psychiatric evaluation needed for safety. Discussed urgent psychiatric care with her husband and ensured he is informed.Her husband chose to transport her to [facility] center rather than use EMS       Meds ordered this encounter  Medications   Insulin  Glargine (BASAGLAR  KWIKPEN) 100 UNIT/ML    Sig: Inject 19 Units into the skin 2 (two) times daily. May increase to  a maximum total daily dose of 40 units.    Dispense:  12 mL    Refill:  4    Can substitute to any approved long-acting insulin , 1 month supply, dispense as needed syringes and needles.    Follow-up: Return in about 4 weeks (around 02/26/2024) for DM.    Anise Harbin R Magnolia Mattila, FNP

## 2024-01-29 NOTE — ED Notes (Signed)
 Patient currently using the unit phone. RR even and unlabored, appearing in no noted distress. Environmental check complete

## 2024-01-29 NOTE — ED Provider Notes (Signed)
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 samples taken after fasting for at least 8 hours.   Glucose-Capillary 11/26/2023 143 (H)  70 - 99 mg/dL Final   Glucose  reference range applies only to samples taken after fasting for at least 8 hours.   Glucose-Capillary 11/26/2023 94  70 - 99 mg/dL Final   Glucose reference range applies only to samples taken after fasting for at least 8 hours.   Sodium 11/27/2023 138  135 - 145 mmol/L Final   Potassium 11/27/2023 3.8  3.5 - 5.1 mmol/L Final   Chloride 11/27/2023 105  98 - 111 mmol/L Final   CO2 11/27/2023 26  22 - 32 mmol/L Final   Glucose, Bld 11/27/2023 107 (H)  70 - 99 mg/dL Final   Glucose reference range applies only  to samples taken after fasting for at least 8 hours.   BUN 11/27/2023 24 (H)  6 - 20 mg/dL Final   Creatinine, Ser 11/27/2023 0.95  0.44 - 1.00 mg/dL Final   Calcium  11/27/2023 8.9  8.9 - 10.3 mg/dL Final   GFR, Estimated 11/27/2023 >60  >60 mL/min Final   Comment: (NOTE) Calculated using the CKD-EPI Creatinine Equation (2021)    Anion gap 11/27/2023 7  5 - 15 Final   Performed at [facility], [address], [place], [place] [address]   Magnesium  11/27/2023 2.4  1.7 - 2.4 mg/dL Final   Performed at [facility], [address], [place], [place] [address]   Glucose-Capillary 11/26/2023 119 (H)  70 - 99 mg/dL Final   Glucose reference range applies only to samples taken after fasting for at least 8 hours.   Glucose-Capillary 11/27/2023 119 (H)  70 - 99 mg/dL Final   Glucose reference range applies only to samples taken after fasting for at least 8 hours.   Glucose-Capillary 11/27/2023 169 (H)  70 - 99 mg/dL Final   Glucose reference range applies only to samples taken after fasting for at least 8 hours.  Office Visit on 10/23/2023  Component Date Value Ref Range Status   Hemoglobin A1C 10/23/2023 10.7 (A)  4.0 - 5.6 % Final   TSH 10/23/2023 0.652  0.450 - 4.500 uIU/mL Final   Free T4 10/23/2023 1.48  0.82 - 1.77 ng/dL Final   Glucose 94/69/7974 305 (H)  70 - 99 mg/dL Final   BUN 94/69/7974 15  6 - 24 mg/dL Final   Creatinine, Ser 10/23/2023 1.05 (H)  0.57 - 1.00 mg/dL Final   eGFR 94/69/7974 64  >59 mL/min/1.73 Final   BUN/Creatinine Ratio 10/23/2023 14  9 - 23 Final   Sodium 10/23/2023 137  134 - 144 mmol/L Final   Potassium 10/23/2023 3.8  3.5 - 5.2 mmol/L Final   Chloride 10/23/2023 102  96 - 106 mmol/L Final   CO2 10/23/2023 19 (L)  20 - 29 mmol/L Final   Calcium  10/23/2023 10.0  8.7 - 10.2 mg/dL Final  Scanned Document on 08/31/2023  Component Date Value Ref Range Status   HM Diabetic Eye Exam 08/21/2023 Retinopathy (A)  No Retinopathy Final   ABSTRACTED BY HIM     Allergies: Metformin hcl  Medications:  PTA Medications  Medication Sig   aspirin  EC 81 MG tablet Take 81 mg by mouth daily.   Cholecalciferol (VITAMIN D -3) 125 MCG (5000 UT) TABS Take 2 tablets by mouth daily.   Multiple Vitamin (MULTIVITAMIN) tablet Take 1 tablet by mouth daily.   albuterol  (VENTOLIN  HFA) 108 (90 Base) MCG/ACT inhaler Inhale 2 puffs into the lungs every 6 (six) hours as needed for wheezing or shortness of breath.   topiramate  (TOPAMAX ) 100 MG tablet TAKE 1 TABLET BY MOUTH EVERYDAY  AT BEDTIME   rizatriptan  (MAXALT ) 10 MG tablet Take 1 tablet (10 mg total) by mouth as needed for migraine. May repeat in 2 hours if needed   Blood Glucose Monitoring Suppl (TRUE METRIX METER) w/Device KIT Use as directed to monitor blood sugar.   glucose blood (TRUE METRIX BLOOD GLUCOSE TEST) test strip Use as instructed to monitor blood sugar twice daily   TRUEplus Lancets 30G MISC Use as directed to monitor blood sugar twice daily   atorvastatin  (LIPITOR) 10 MG tablet Take 1 tablet (10 mg total) by mouth daily.   DULoxetine  (CYMBALTA ) 60 MG capsule Take 1 capsule (60 mg total) by mouth 2 (two) times daily.   losartan  (COZAAR ) 100 MG tablet Take 1 tablet (100 mg total) by mouth daily.   levothyroxine  (SYNTHROID ) 75 MCG tablet Take 1 tablet (75 mcg total) by mouth daily before breakfast.   risperiDONE  (RISPERDAL ) 2 MG tablet TAKE 1 TABLET EVERY MORNING AND 3 TABLETS AT BEDTIME   traZODone  (DESYREL ) 100 MG tablet Take 2 tablets (200 mg total) by mouth at bedtime.   gabapentin  (NEURONTIN ) 300 MG capsule Take 1 capsule (300 mg total) by mouth daily.   benztropine  (COGENTIN ) 1 MG tablet Take 1 tablet (1 mg total) by mouth at bedtime.   Glucagon  (GVOKE HYPOPEN  2-PACK) 1 MG/0.2ML SOAJ Inject 1 mg into the skin daily as needed.   clonazePAM  (KLONOPIN ) 0.5 MG tablet Take 1 tablet (0.5 mg total) by mouth 2 (two) times daily as needed for anxiety.   ferrous sulfate 325 (65 FE) MG tablet Take 325 mg  by mouth daily with breakfast.   acetaminophen  (TYLENOL ) 650 MG CR tablet Take 650 mg by mouth every 8 (eight) hours as needed for pain.   insulin  lispro (HUMALOG ) 100 UNIT/ML KwikPen Take before each meal 3 times a day, 140-199 - 2 units, 200-250 - 4 units, 251-299 - 6 units, 300-349 - 8 units, 350 or above 10 units   Insulin  Pen Needle (PEN NEEDLES) 31G X 6 MM MISC For 4 times a day insulin , 1 month supply. Diagnosis E11.65   Insulin  Glargine (BASAGLAR  KWIKPEN) 100 UNIT/ML Inject 19 Units into the skin 2 (two) times daily. May increase to a maximum total daily dose of 40 units.      Medical Decision Making  Admit to Observation Unit. Meets Criteria for Inpatient treatment Agitation protocol ordered EKG order  Labs: CBC, CBG, CMP, RPR, A1C, TSH, UDS, UA, UPT, Ethanol, Hepatic function panel, Lipid panel, Magnesium   Medications:  Acetaminophen  650 mg PO Q 6 PRN Maalox 30 ml PO Q 4 PRN Milk of Magnesia 30 ml PO Daily PRN  Home medications in review    Recommendations  Based on my evaluation the patient does not appear to have an emergency medical condition.  Patient meets criteria for Inpatient. We will admit to Observation unit, waiting for disposition.   Randall Bouquet, NP 01/29/24  1:37 PM

## 2024-01-29 NOTE — Assessment & Plan Note (Addendum)
  She reported feeling overwhelmed with thoughts of wanting to die due to stress from her daughter's incarceration. Denied active self-harm plans but acknowledged distress. Immediate psychiatric evaluation needed for safety. Discussed urgent psychiatric care with her husband and ensured he is informed.Her husband chose to transport her to [facility] rather than use EMS

## 2024-01-30 ENCOUNTER — Other Ambulatory Visit: Payer: Self-pay

## 2024-01-30 ENCOUNTER — Inpatient Hospital Stay (HOSPITAL_COMMUNITY)
Admission: AD | Admit: 2024-01-30 | Discharge: 2024-02-05 | DRG: 885 | Disposition: A | Discharge status: Home / Self Care | Source: Intra-hospital | Attending: Student in an Organized Health Care Education/Training Program | Admitting: Student in an Organized Health Care Education/Training Program

## 2024-01-30 ENCOUNTER — Encounter (HOSPITAL_COMMUNITY): Payer: Self-pay

## 2024-01-30 DIAGNOSIS — E039 Hypothyroidism, unspecified: Secondary | ICD-10-CM | POA: Diagnosis present

## 2024-01-30 DIAGNOSIS — Z604 Social exclusion and rejection: Secondary | ICD-10-CM | POA: Diagnosis present

## 2024-01-30 DIAGNOSIS — K5900 Constipation, unspecified: Secondary | ICD-10-CM | POA: Diagnosis not present

## 2024-01-30 DIAGNOSIS — Z860100 Personal history of colon polyps, unspecified: Secondary | ICD-10-CM

## 2024-01-30 DIAGNOSIS — Z833 Family history of diabetes mellitus: Secondary | ICD-10-CM

## 2024-01-30 DIAGNOSIS — Z811 Family history of alcohol abuse and dependence: Secondary | ICD-10-CM | POA: Diagnosis not present

## 2024-01-30 DIAGNOSIS — Z79890 Hormone replacement therapy (postmenopausal): Secondary | ICD-10-CM

## 2024-01-30 DIAGNOSIS — I10 Essential (primary) hypertension: Secondary | ICD-10-CM | POA: Diagnosis present

## 2024-01-30 DIAGNOSIS — Z7982 Long term (current) use of aspirin: Secondary | ICD-10-CM

## 2024-01-30 DIAGNOSIS — F251 Schizoaffective disorder, depressive type: Principal | ICD-10-CM | POA: Diagnosis present

## 2024-01-30 DIAGNOSIS — R030 Elevated blood-pressure reading, without diagnosis of hypertension: Secondary | ICD-10-CM | POA: Diagnosis not present

## 2024-01-30 DIAGNOSIS — E785 Hyperlipidemia, unspecified: Secondary | ICD-10-CM | POA: Diagnosis present

## 2024-01-30 DIAGNOSIS — Z8249 Family history of ischemic heart disease and other diseases of the circulatory system: Secondary | ICD-10-CM

## 2024-01-30 DIAGNOSIS — Z841 Family history of disorders of kidney and ureter: Secondary | ICD-10-CM

## 2024-01-30 DIAGNOSIS — E1140 Type 2 diabetes mellitus with diabetic neuropathy, unspecified: Secondary | ICD-10-CM | POA: Diagnosis present

## 2024-01-30 DIAGNOSIS — Z5941 Food insecurity: Secondary | ICD-10-CM

## 2024-01-30 DIAGNOSIS — Z9151 Personal history of suicidal behavior: Secondary | ICD-10-CM | POA: Diagnosis not present

## 2024-01-30 DIAGNOSIS — F259 Schizoaffective disorder, unspecified: Principal | ICD-10-CM | POA: Diagnosis present

## 2024-01-30 DIAGNOSIS — Z91410 Personal history of adult physical and sexual abuse: Secondary | ICD-10-CM | POA: Diagnosis not present

## 2024-01-30 DIAGNOSIS — R45851 Suicidal ideations: Secondary | ICD-10-CM | POA: Diagnosis present

## 2024-01-30 DIAGNOSIS — Z79899 Other long term (current) drug therapy: Secondary | ICD-10-CM

## 2024-01-30 DIAGNOSIS — K219 Gastro-esophageal reflux disease without esophagitis: Secondary | ICD-10-CM | POA: Diagnosis present

## 2024-01-30 DIAGNOSIS — Z823 Family history of stroke: Secondary | ICD-10-CM | POA: Diagnosis not present

## 2024-01-30 DIAGNOSIS — Z794 Long term (current) use of insulin: Secondary | ICD-10-CM | POA: Diagnosis not present

## 2024-01-30 DIAGNOSIS — Z888 Allergy status to other drugs, medicaments and biological substances status: Secondary | ICD-10-CM

## 2024-01-30 LAB — GLUCOSE, CAPILLARY
Glucose-Capillary: 255 mg/dL — ABNORMAL HIGH (ref 70–99)
Glucose-Capillary: 358 mg/dL — ABNORMAL HIGH (ref 70–99)
Glucose-Capillary: 158 mg/dL — ABNORMAL HIGH (ref 70–99)
Glucose-Capillary: 237 mg/dL — ABNORMAL HIGH (ref 70–99)

## 2024-01-30 LAB — RPR: RPR Ser Ql: NONREACTIVE

## 2024-01-30 MED ORDER — ACETAMINOPHEN 325 MG PO TABS
650.0000 mg | ORAL_TABLET | Freq: Four times a day (QID) | ORAL | Status: DC | PRN
  Administered 2024-01-31 – 2024-02-03 (×5): 650 mg via ORAL
  Filled 2024-01-30 (×5): qty 2

## 2024-01-30 MED ORDER — TOPIRAMATE 100 MG PO TABS
100.0000 mg | ORAL_TABLET | Freq: Every day | ORAL | Status: DC
Start: 2024-01-30 — End: 2024-01-31
  Administered 2024-01-30: 100 mg via ORAL
  Filled 2024-01-30: qty 1

## 2024-01-30 MED ORDER — MAGNESIUM HYDROXIDE 400 MG/5ML PO SUSP
30.0000 mL | Freq: Every day | ORAL | Status: DC | PRN
  Administered 2024-01-31: 30 mL via ORAL
  Filled 2024-01-30: qty 30

## 2024-01-30 MED ORDER — HALOPERIDOL 5 MG PO TABS: 5.0000 mg | ORAL_TABLET | Freq: Three times a day (TID) | ORAL | Status: DC | PRN

## 2024-01-30 MED ORDER — FERROUS SULFATE 325 (65 FE) MG PO TABS
325.0000 mg | ORAL_TABLET | Freq: Every day | ORAL | Status: DC
  Administered 2024-01-31 – 2024-02-05 (×6): 325 mg via ORAL
  Filled 2024-01-30 (×6): qty 1

## 2024-01-30 MED ORDER — LOSARTAN POTASSIUM 50 MG PO TABS
100.0000 mg | ORAL_TABLET | Freq: Every day | ORAL | Status: DC
  Administered 2024-01-31 – 2024-02-05 (×6): 100 mg via ORAL
  Filled 2024-01-30 (×6): qty 2

## 2024-01-30 MED ORDER — ALUM & MAG HYDROXIDE-SIMETH 200-200-20 MG/5ML PO SUSP: 30.0000 mL | ORAL | Status: DC | PRN

## 2024-01-30 MED ORDER — DULOXETINE HCL 60 MG PO CPEP
60.0000 mg | ORAL_CAPSULE | Freq: Two times a day (BID) | ORAL | Status: DC
  Administered 2024-01-31 – 2024-02-05 (×11): 60 mg via ORAL
  Filled 2024-01-30 (×11): qty 1

## 2024-01-30 MED ORDER — LEVOTHYROXINE SODIUM 75 MCG PO TABS
75.0000 ug | ORAL_TABLET | Freq: Every day | ORAL | Status: DC
  Administered 2024-01-31 – 2024-02-05 (×6): 75 ug via ORAL
  Filled 2024-01-30 (×6): qty 1

## 2024-01-30 MED ORDER — HYDROXYZINE HCL 25 MG PO TABS
25.0000 mg | ORAL_TABLET | Freq: Three times a day (TID) | ORAL | Status: DC | PRN
  Administered 2024-02-03: 25 mg via ORAL
  Filled 2024-01-30: qty 1

## 2024-01-30 MED ORDER — INSULIN GLARGINE-YFGN 100 UNIT/ML ~~LOC~~ SOLN
19.0000 [IU] | Freq: Two times a day (BID) | SUBCUTANEOUS | Status: DC
  Administered 2024-01-30 – 2024-02-05 (×12): 19 [IU] via SUBCUTANEOUS

## 2024-01-30 MED ORDER — DIPHENHYDRAMINE HCL 25 MG PO CAPS: 50.0000 mg | ORAL_CAPSULE | Freq: Three times a day (TID) | ORAL | Status: DC | PRN

## 2024-01-30 MED ORDER — ATORVASTATIN CALCIUM 10 MG PO TABS
10.0000 mg | ORAL_TABLET | Freq: Every day | ORAL | Status: DC
Start: 2024-01-31 — End: 2024-02-05
  Administered 2024-01-31 – 2024-02-05 (×6): 10 mg via ORAL
  Filled 2024-01-30 (×6): qty 1

## 2024-01-30 MED ORDER — ALBUTEROL SULFATE HFA 108 (90 BASE) MCG/ACT IN AERS: 2.0000 | INHALATION_SPRAY | Freq: Four times a day (QID) | RESPIRATORY_TRACT | Status: DC | PRN

## 2024-01-30 MED ORDER — INSULIN ASPART 100 UNIT/ML IJ SOLN
0.0000 [IU] | Freq: Three times a day (TID) | INTRAMUSCULAR | Status: DC
  Administered 2024-01-30: 3 [IU] via SUBCUTANEOUS
  Administered 2024-01-31: 8 [IU] via SUBCUTANEOUS
  Administered 2024-01-31: 15 [IU] via SUBCUTANEOUS
  Administered 2024-01-31: 3 [IU] via SUBCUTANEOUS
  Administered 2024-02-01 (×2): 5 [IU] via SUBCUTANEOUS
  Administered 2024-02-01: 3 [IU] via SUBCUTANEOUS
  Administered 2024-02-02 (×2): 2 [IU] via SUBCUTANEOUS
  Administered 2024-02-02: 8 [IU] via SUBCUTANEOUS
  Administered 2024-02-03 (×3): 5 [IU] via SUBCUTANEOUS
  Administered 2024-02-04: 2 [IU] via SUBCUTANEOUS
  Administered 2024-02-04: 3 [IU] via SUBCUTANEOUS
  Administered 2024-02-04: 2 [IU] via SUBCUTANEOUS

## 2024-01-30 MED ORDER — RISPERIDONE 2 MG PO TABS
6.0000 mg | ORAL_TABLET | Freq: Every day | ORAL | Status: DC
  Administered 2024-01-30 – 2024-02-04 (×6): 6 mg via ORAL
  Filled 2024-01-30 (×6): qty 3

## 2024-01-30 MED ORDER — ASPIRIN 81 MG PO TBEC
81.0000 mg | DELAYED_RELEASE_TABLET | Freq: Every day | ORAL | Status: DC
  Administered 2024-01-31 – 2024-02-05 (×6): 81 mg via ORAL
  Filled 2024-01-30 (×6): qty 1

## 2024-01-30 MED ORDER — INSULIN GLARGINE 100 UNIT/ML ~~LOC~~ SOLN
19.0000 [IU] | Freq: Two times a day (BID) | SUBCUTANEOUS | Status: DC
  Filled 2024-01-30 (×2): qty 0.19

## 2024-01-30 MED ORDER — BENZTROPINE MESYLATE 1 MG PO TABS
1.0000 mg | ORAL_TABLET | Freq: Every day | ORAL | Status: DC
  Administered 2024-01-30 – 2024-02-04 (×6): 1 mg via ORAL
  Filled 2024-01-30 (×6): qty 1

## 2024-01-30 MED ORDER — RISPERIDONE 2 MG PO TABS
2.0000 mg | ORAL_TABLET | Freq: Every day | ORAL | Status: DC
  Administered 2024-01-31: 2 mg via ORAL
  Filled 2024-01-30: qty 1

## 2024-01-30 MED ORDER — TRAZODONE HCL 50 MG PO TABS
50.0000 mg | ORAL_TABLET | Freq: Every evening | ORAL | Status: DC | PRN
  Administered 2024-01-31 – 2024-02-04 (×5): 50 mg via ORAL
  Filled 2024-01-30 (×5): qty 1

## 2024-01-30 MED ORDER — INSULIN ASPART 100 UNIT/ML IJ SOLN
0.0000 [IU] | Freq: Every day | INTRAMUSCULAR | Status: DC
  Administered 2024-01-30: 2 [IU] via SUBCUTANEOUS
  Administered 2024-01-31 – 2024-02-02 (×2): 3 [IU] via SUBCUTANEOUS
  Administered 2024-02-03 – 2024-02-04 (×2): 2 [IU] via SUBCUTANEOUS

## 2024-01-30 NOTE — Tx Team (Signed)
 Initial Treatment Plan 01/30/2024 4:07 PM Kathryn [name] [id number]    PATIENT STRESSORS: Financial difficulties   Loss of granddaughter     PATIENT STRENGTHS: Ability for insight  Supportive family/friends    PATIENT IDENTIFIED PROBLEMS: Depression  Suicidal thoughts                   DISCHARGE CRITERIA:  Improved stabilization in mood, thinking, and/or behavior Verbal commitment to aftercare and medication compliance  PRELIMINARY DISCHARGE PLAN: Outpatient therapy Return to previous living arrangement  PATIENT/FAMILY INVOLVEMENT: This treatment plan has been presented to and reviewed with the patient, Kathryn [name], and/or family member.  The patient and family have been given the opportunity to ask questions and make suggestions.  Kathryn DELENA Burrs, RN 01/30/2024, 4:07 PM

## 2024-01-30 NOTE — Group Note (Signed)
 Date:  01/30/2024 Time:  4:27 PM  Group Topic/Focus:  Managing Feelings:   The focus of this group is to identify what feelings patients have difficulty handling and develop a plan to handle them in a healthier way upon discharge.    Participation Level:  Active  Participation Quality:  Attentive  Affect:  Appropriate  Cognitive:  Appropriate  Insight: Appropriate  Engagement in Group:  Engaged  Modes of Intervention:  Activity, Discussion, and Education   Huel Mall 01/30/2024, 4:27 PM

## 2024-01-30 NOTE — ED Notes (Signed)
 At the time of discharge, the patient was alert and oriented 3,  Vital signs at discharge were stable and within normal limits. No signs of acute distress were noted. The patient was medically cleared for discharge.  Patient was discharged to [facility]. Transportation with safe transport Report given to [facility]

## 2024-01-30 NOTE — ED Notes (Addendum)
 Patient alert and oriented. PT resting in bed, woke up upon nurse waking pt up. Mood appears calm. affect is flat. No acute distress observed.   Blood sugar check completed, insulin  administered per orders  Pt verbalized si ideation, states command hallucinations telling her to harm self. Pt unable to contract for safety at this time, pt monitored on unit. Patrice NP and Corean MD notified

## 2024-01-30 NOTE — Plan of Care (Signed)
Patient is newly admitted and requires stabilization of psychiatric symptoms.  

## 2024-01-30 NOTE — ED Notes (Addendum)
 Notified Kathryne Show, NP of patient's elevated BP and that patient did not agree to contract for safety during shift nursing assessment.  Provider Notified of patient receiving agitation protocol and that patient's BP was retaken manual. New BP 146/80 and that patient verbalized to feeling better and that she did not feel the urge to self harm.

## 2024-01-30 NOTE — Progress Notes (Signed)
 Patient newly admitted from [facility] under voluntary commitment for suicidal thoughts. She endorses AH of hearing multiple voices to hurt herself. She reports her stressors is the loss of her granddaughter. She has a history of DM. She is cooperative with treatment. Patient also endorses depression.

## 2024-01-30 NOTE — [facility] Group Notes (Deleted)
 [facility] Group Notes:  (Nursing/MHT/Case Management/Adjunct)  Date:  01/30/2024  Time:  9:18 PM  Type of Therapy:  Wrap-up group  Participation Level:  Did Not Attend  Participation Quality:    Affect:    Cognitive:    Insight:    Engagement in Group:    Modes of Intervention:    Summary of Progress/Problems: Didn't attend.  Kathryn [name] 01/30/2024, 9:18 PM

## 2024-01-30 NOTE — ED Notes (Signed)
 Patient currently sleeping and resting in recliner. RR even and unlabored, appearing in no noted distress. Environmental check complete
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
## 2024-01-30 NOTE — ED Notes (Signed)
Pt had lunch 

## 2024-01-30 NOTE — Progress Notes (Signed)
   01/30/24 2132  Psych Admission Type (Psych Patients Only)  Admission Status Voluntary  Psychosocial Assessment  Patient Complaints None  Eye Contact Fair  Facial Expression Flat  Affect Appropriate to circumstance;Depressed  Speech Logical/coherent  Interaction Minimal  Motor Activity Slow  Appearance/Hygiene Unremarkable  Behavior Characteristics Cooperative;Appropriate to situation  Mood Depressed;Pleasant  Thought Process  Coherency WDL  Content WDL  Delusions None reported or observed  Perception Hallucinations  Hallucination Auditory;Command  Judgment Poor  Confusion None  Danger to Self  Current suicidal ideation? Passive  Description of Suicide Plan verbally contracts

## 2024-01-30 NOTE — [facility] Group Notes (Signed)
 [facility] Group Notes:  (Nursing/MHT/Case Management/Adjunct)  Date:  01/30/2024  Time:  9:32 PM  Type of Therapy:  Wrap-up group  Participation Level:  Active  Participation Quality:  Appropriate  Affect:  Appropriate  Cognitive:  Appropriate  Insight:  Appropriate  Engagement in Group:  Engaged  Modes of Intervention:  Education  Summary of Progress/Problems: Goal to pray. Rated day 8/10.  Kathryn [name] 01/30/2024, 9:32 PM

## 2024-01-30 NOTE — ED Notes (Signed)
 Patient resting in lounger with eyes closed, respirations even and unlabored. Patient in no apparent acute distress. Environment secured. Safety checks in place per facility protocol.

## 2024-01-30 NOTE — Discharge Instructions (Signed)
Patient accepted to [facility]  

## 2024-01-31 LAB — GLUCOSE, CAPILLARY
Glucose-Capillary: 197 mg/dL — ABNORMAL HIGH (ref 70–99)
Glucose-Capillary: 354 mg/dL — ABNORMAL HIGH (ref 70–99)
Glucose-Capillary: 255 mg/dL — ABNORMAL HIGH (ref 70–99)
Glucose-Capillary: 251 mg/dL — ABNORMAL HIGH (ref 70–99)

## 2024-01-31 MED ORDER — LINACLOTIDE 145 MCG PO CAPS
145.0000 ug | ORAL_CAPSULE | Freq: Every day | ORAL | Status: DC
  Administered 2024-02-01 – 2024-02-05 (×5): 145 ug via ORAL
  Filled 2024-01-31 (×5): qty 1

## 2024-01-31 NOTE — Plan of Care (Signed)
   Problem: Education: Goal: Emotional status will improve Outcome: Progressing Goal: Mental status will improve Outcome: Progressing   Problem: Activity: Goal: Interest or engagement in activities will improve Outcome: Progressing

## 2024-01-31 NOTE — Group Note (Signed)
 Date:  01/31/2024 Time:  9:31 AM  Group Topic/Focus:  Goals Group:   The focus of this group is to help patients establish daily goals to achieve during treatment and discuss how the patient can incorporate goal setting into their daily lives to aide in recovery.    Participation Level:  Active  Participation Quality:  Appropriate and Attentive  Affect:  Appropriate  Cognitive:  Alert and Appropriate  Insight: Appropriate and Improving  Engagement in Group:  Engaged and Improving  Modes of Intervention:  Discussion and Exploration  Additional Comments:  Pt attended and participated in goals group.  Kristi HERO Navpreet Szczygiel 01/31/2024, 9:31 AM

## 2024-01-31 NOTE — [facility] Group Notes (Signed)
 [facility] Group Notes:  (Nursing/MHT/Case Management/Adjunct)  Date:  01/31/2024  Time:  2000  Type of Therapy:  Wrap up group  Participation Level:  Active  Participation Quality:  Appropriate, Attentive, Sharing, and Supportive  Affect:  Flat  Cognitive:  Alert  Insight:  Improving  Engagement in Group:  Engaged  Modes of Intervention:  Clarification, Education, and Support  Summary of Progress/Problems: Positive thinking and positive change were discussed.   Kathryn [name] 01/31/2024, 9:16 PM

## 2024-01-31 NOTE — Group Note (Signed)
 [facility] LCSW Group Therapy Note  Date/Time:  @TD @ 10:00-11:00  Type of Therapy and Topic:  Group Therapy:  Personal Bill of Rights  Participation Level:  Active   Description of Group This process group involved a discussion with and between patients about Understanding self.   The difference between healthy and unhealthy coping skills was described, then examples were elicited from group members for their Personal bill of Rights.  This then was followed by a discussion about boundaries, respects and what the patient wants, what it is, how important it is, and why we choose the coping techniques we choose.  Participants were encouraged to think about knowing what they want as necessary and positive.  Therapeutic Goals Patient will identify and describe what their boundary consists of Patient will participate in generating ideas when they use their coping skills to address their boundary  Patients will be supportive of one another and receive support from others Patients will understand the need all humans have to   Summary of Patient Progress:  The patient expressed engagement and understand group topic   Therapeutic Modalities Brief Solution-Focused Therapy Psychoeducation  Lashaun Krapf O Yuji Walth, LCSWA 01/31/2024  1:44 PM
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
## 2024-01-31 NOTE — Progress Notes (Addendum)
 Patient has been visible in the milieu, observed interacting well with peers and staff and attending groups. Pt's BP continues to be elevated, but remains asymptomatic. (153/103, HR 109)  MD on call made aware.

## 2024-01-31 NOTE — Group Note (Signed)
 Date:  01/31/2024 Time:  10:08 AM  Group Topic/Focus:  Coping Mechanisms This group focused on coping mechanisms and understanding the importance of developing healthier ways to manage stress, emotional distress, and mental health challenges. The group explored healthy vs. nonhealthy coping mechanisms, emphasizing how some behaviors like substance abuse or avoidance can worsen mental health, while others like adequate sleep and food and relaxation techniques can lead to better emotional regulation and overall well-being.  A worksheet was provided to participants and highlighted adaptive vs. maladaptive coping mechanisms. Participants were encouraged to share their personal experiences and engage in conversation focused on resilience, improving self-awareness, and practicing deep breathing as a positive coping mechanism.    Participation Level:  Minimal  Participation Quality:  Appropriate  Affect:  Appropriate  Cognitive:  Alert and Appropriate  Insight: Improving and Lacking  Engagement in Group:  Improving and Lacking  Modes of Intervention:  Discussion, Education, and Problem-solving  Additional Comments:  Pt attended this group and actively listened; no verbal participation.  Kristi HERO Brie Eppard 01/31/2024, 10:08 AM

## 2024-01-31 NOTE — Plan of Care (Signed)
   Problem: Education: Goal: Emotional status will improve Outcome: Progressing   Problem: Activity: Goal: Interest or engagement in activities will improve Outcome: Progressing

## 2024-01-31 NOTE — Progress Notes (Signed)
   01/31/24 2053  Psychosocial Assessment  Patient Complaints Depression  Eye Contact Fair  Facial Expression Flat  Affect Appropriate to circumstance;Depressed  Speech Logical/coherent  Interaction Minimal  Motor Activity Slow  Appearance/Hygiene Unremarkable  Behavior Characteristics Cooperative;Appropriate to situation  Mood Pleasant  Thought Process  Coherency WDL  Content WDL  Delusions None reported or observed  Perception Hallucinations  Hallucination Auditory;Command  Judgment Poor  Confusion None  Danger to Self  Current suicidal ideation? Passive

## 2024-01-31 NOTE — [facility] Suicide Risk Assessment (Signed)
 [facility] Admission Suicide Risk Assessment   Total Time spent with patient: 1 hour Principal Problem: Schizoaffective disorder, depressive type (HCC) Diagnosis:  Principal Problem:   Schizoaffective disorder, depressive type (HCC)   Suicide Risk: The patient presents with acute risk factors for suicide including current depressed mood, grief, and suicidal ideation with a plan. She carries additional chronic risk factors for suicide including history of one prior suicide attempt (age 53) recurrent depressive symptoms, recurrent SI when depressed, significant family losses (mother, granddaughter who was lost violently), poor coping skills. These risk factors are mitigated by protective factors including no recent suicide attempts, good outpatient adherence to medications with few lifetime admissions, married, domiciled, employed, has good family support through children, grandchildren and siblings, voices religious belief, expressing some future orientation and wish to get better. Due largely to active depression and SI the patient is currently deemed at moderate-high risk of suicide. She voluntarily presented to the hospital for help and we will address acute risk factors by monitoring on inpatient psychiatry, adjusting medications, including therapy and assisting in finding follow up for grief counseling in the future.   I certify that inpatient services furnished can reasonably be expected to improve the patient's condition.   Kathryn LOISE Arts, MD 01/31/2024, 4:16 PM

## 2024-01-31 NOTE — Progress Notes (Addendum)
 D. Pt presents with a sad affect, pleasant during interactions. Pt reported poor sleep last night, but did not receive prn sleep meds. Pt described her appetite and concentration as 'good', and energy level as 'normal. Per pt's self inventory, pt rated her depression, hopelessness and anxiety a 5/4/3, respectively. Pt's stated goal today is to work on staying focused on not having any suicidal thoughts of harming myself, and not hearing voices or seeing people, and will read my bible, in order to achieve this goal.   Pt currently denies SI/HI and AVH and doesn't appear to be responding to internal stimuli A. Labs and vitals monitored. Pt given and educated on medications. Pt supported emotionally and encouraged to express concerns and ask questions.   R. Pt remains safe with 15 minute checks. Will continue POC.    01/31/24 1000  Psychosocial Assessment  Patient Complaints Depression  Eye Contact Fair  Facial Expression Sad  Affect Appropriate to circumstance  Speech Logical/coherent  Interaction Minimal  Motor Activity Slow  Appearance/Hygiene Unremarkable  Behavior Characteristics Cooperative;Appropriate to situation  Mood Depressed;Pleasant  Thought Process  Coherency WDL  Content WDL  Delusions None reported or observed  Perception Hallucinations  Hallucination None reported or observed  Judgment Poor  Confusion None  Danger to Self  Current suicidal ideation? Passive  Agreement Not to Harm Self Yes  Description of Agreement agreed to contact staff before acting on harmful thoughts  Danger to Others  Danger to Others None reported or observed

## 2024-01-31 NOTE — Plan of Care (Signed)
  Problem: Education: Goal: Emotional status will improve Outcome: Progressing Goal: Mental status will improve Outcome: Progressing   Problem: Education: Goal: Mental status will improve Outcome: Progressing   
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
## 2024-01-31 NOTE — Progress Notes (Addendum)
(  Sleep Hours) - 3hours (Any PRNs that were needed, meds refused, or side effects to meds)- 0 (Any disturbances and when (visitation, over night)- none (Concerns raised by the patient)- none  (SI/HI/AVH)- Passive SI reported by pt

## 2024-01-31 NOTE — Group Note (Signed)
 Date:  01/31/2024 Time:  5:54 PM  Group Topic/Focus:  The focus of this group is to introduce the topic of wellness and how collage can be used as a creative outlet for expressing emotions, reducing anxiety, while fostering group cohesion and enabling personal insight and healing.    Participation Level:  Active  Participation Quality:  Appropriate and Attentive  Affect:  Appropriate  Cognitive:  Alert and Appropriate  Insight: Appropriate and Good  Engagement in Group:  Engaged  Modes of Intervention:  Activity, Discussion, Exploration, Rapport Building, Socialization, and Support  Additional Comments:    Kathryn [name] 01/31/2024, 5:54 PM

## 2024-02-01 ENCOUNTER — Encounter (HOSPITAL_COMMUNITY): Payer: Self-pay

## 2024-02-01 DIAGNOSIS — F251 Schizoaffective disorder, depressive type: Principal | ICD-10-CM

## 2024-02-01 LAB — GLUCOSE, CAPILLARY
Glucose-Capillary: 155 mg/dL — ABNORMAL HIGH (ref 70–99)
Glucose-Capillary: 211 mg/dL — ABNORMAL HIGH (ref 70–99)
Glucose-Capillary: 223 mg/dL — ABNORMAL HIGH (ref 70–99)
Glucose-Capillary: 183 mg/dL — ABNORMAL HIGH (ref 70–99)

## 2024-02-01 MED ORDER — WHITE PETROLATUM EX OINT
TOPICAL_OINTMENT | CUTANEOUS | Status: AC
  Filled 2024-02-01: qty 5

## 2024-02-01 MED ORDER — AMLODIPINE BESYLATE 5 MG PO TABS
5.0000 mg | ORAL_TABLET | Freq: Every day | ORAL | Status: DC
  Administered 2024-02-01 – 2024-02-05 (×5): 5 mg via ORAL
  Filled 2024-02-01 (×5): qty 1
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
## 2024-02-01 NOTE — Group Note (Signed)
 Date:  02/01/2024 Time:  10:45 AM  Group Topic/Focus:  Goals Group:   The focus of this group is to help patients establish daily goals to achieve during treatment and discuss how the patient can incorporate goal setting into their daily lives to aide in recovery.    Participation Level:  Active  Participation Quality:  Attentive  Affect:  Appropriate  Cognitive:  Appropriate  Insight: Good  Engagement in Group:  Engaged  Modes of Intervention:  Education  Additional Comments:    Avelina DELENA Humphreys 02/01/2024, 10:45 AM

## 2024-02-01 NOTE — Group Note (Signed)
 Recreation Therapy Group Note   Group Topic:Stress Management  Group Date: 02/01/2024 Start Time: 0930 End Time: 1000 Facilitators: Taija Mathias-McCall, LRT,CTRS Location: [address] Dayroom   Group Topic: Stress Management   Goal Area(s) Addresses:  Patient will actively participate in stress management techniques presented during session.  Patient will successfully identify benefit of practicing stress management post d/c.   Behavioral Response:   Intervention: Relaxation exercise with ambient sound and script   Activity: Guided Imagery. LRT provided education, instruction, and demonstration on practice of visualization via guided imagery. Patient was asked to participate in the technique introduced during session. LRT debriefed including topics of mindfulness, stress management and specific scenarios each patient could use these techniques. Patients were given suggestions of ways to access scripts post d/c and encouraged to explore Youtube and other apps available on smartphones, tablets, and computers.  Education:  Stress Management, Discharge Planning.   Education Outcome: Acknowledges education   Clinical Observations/Individualized Feedback: Group did not take place due to previous group taking recreation therapy group time.    Plan: Continue to engage patient in RT group sessions 2-3x/week.   Kaelei Wheeler-McCall, LRT,CTRS 02/01/2024 3:18 PM

## 2024-02-01 NOTE — Progress Notes (Addendum)
 Patient denies HI/AVH this morning. Pt reports that she was having AH last night but that it went away when she took trazodone  and went to sleep and she has not heard any voices since. Pt reports that I am having less suicidal thoughts of wanting to harm myself today. Pt rates her anxiety a 3/10 and depression a 5/10. Pt reports I feel like I am doing better today. Pt reports that she had a bowel movement this morning. Pt calm and cooperative during interactions. Q 15 minute safety checks in place for safety.    02/01/24 0851  Psych Admission Type (Psych Patients Only)  Admission Status Voluntary  Psychosocial Assessment  Patient Complaints Depression;Anxiety;Other (Comment) (I feel like I am doing better)  Eye Contact Fair  Facial Expression Flat  Affect Appropriate to circumstance  Speech Logical/coherent  Interaction Assertive  Motor Activity Slow  Appearance/Hygiene Unremarkable  Behavior Characteristics Cooperative;Appropriate to situation  Mood Pleasant  Thought Process  Coherency WDL  Content WDL  Delusions None reported or observed  Perception Hallucinations (Pt reports After I took trazodone  and went to sleep last night I stopped having AH and I am not hearing or seeing things this morning)  Hallucination Auditory  Judgment Poor  Confusion None  Danger to Self  Current suicidal ideation? Denies  Description of Suicide Plan No plan verbalized  Self-Injurious Behavior No self-injurious ideation or behavior indicators observed or expressed   Agreement Not to Harm Self Yes  Description of Agreement Pt verbally contracts for safety  Danger to Others  Danger to Others None reported or observed

## 2024-02-01 NOTE — Group Note (Unsigned)
 Date:  02/01/2024 Time:  8:23 AM  Group Topic/Focus:  Goals Group:   The focus of this group is to help patients establish daily goals to achieve during treatment and discuss how the patient can incorporate goal setting into their daily lives to aide in recovery.     Participation Level:  {BHH PARTICIPATION OZCZO:77735}  Participation Quality:  {BHH PARTICIPATION QUALITY:22265}  Affect:  {BHH AFFECT:22266}  Cognitive:  {BHH COGNITIVE:22267}  Insight: {BHH Insight2:20797}  Engagement in Group:  {BHH ENGAGEMENT IN HMNLE:77731}  Modes of Intervention:  {BHH MODES OF INTERVENTION:22269}  Additional Comments:  ***  Kathryn [name] 02/01/2024, 8:23 AM

## 2024-02-01 NOTE — Plan of Care (Signed)

## 2024-02-01 NOTE — Group Note (Signed)
 Therapy Group Note  Group Topic:Other  Group Date: 02/01/2024 Start Time: 1500 End Time: 1530 Facilitators: Keyona Emrich G, OT    The primary objective of this topic is to explore and understand the concept of occupational balance in the context of daily living. The term occupational balance is defined broadly, encompassing all activities that occupy an individual's time and energy, including self-care, leisure, and work-related tasks. The goal is to guide participants towards achieving a harmonious blend of these activities, tailored to their personal values and life circumstances. This balance is aimed at enhancing overall well-being, not by equally distributing time across activities, but by ensuring that daily engagements are fulfilling and not draining. The content delves into identifying various barriers that individuals face in achieving occupational balance, such as overcommitment, misaligned priorities, external pressures, and lack of effective time management. The impact of these barriers on occupational performance, roles, and lifestyles is examined, highlighting issues like reduced efficiency, strained relationships, and potential health problems. Strategies for cultivating occupational balance are a key focus. These strategies include practical methods like time blocking, prioritizing tasks, establishing self-care rituals, decluttering, connecting with nature, and engaging in reflective practices. These approaches are designed to be adaptable and applicable to a wide range of life scenarios, promoting a proactive and mindful approach to daily living. The overall aim is to equip participants with the knowledge and tools to create a balanced lifestyle that supports their mental, emotional, and physical health, thereby improving their functional performance in daily life.     Participation Level: Engaged   Participation Quality: Independent   Behavior: Appropriate   Speech/Thought  Process: Relevant   Affect/Mood: Appropriate   Insight: Fair   Judgement: Fair      Modes of Intervention: Education  Patient Response to Interventions:  Attentive   Plan: Continue to engage patient in OT groups 2 - 3x/week.  02/01/2024  Kathryn [name], OT  Keegen Heffern, OT

## 2024-02-01 NOTE — Inpatient Diabetes Management (Signed)
 Inpatient Diabetes Program Recommendations  AACE/ADA: New Consensus Statement on Inpatient Glycemic Control (2015)  Target Ranges:  Prepandial:   less than 140 mg/dL      Peak postprandial:   less than 180 mg/dL (1-2 hours)      Critically ill patients:  140 - 180 mg/dL   Lab Results  Component Value Date   GLUCAP 155 (H) 02/01/2024   HGBA1C 8.5 (H) 01/29/2024    Review of Glycemic Control  Latest Reference Range & Units 01/31/24 05:49 01/31/24 11:53 01/31/24 17:05 01/31/24 20:40 02/01/24 06:33  Glucose-Capillary 70 - 99 mg/dL 802 (H) 645 (H) 744 (H) 251 (H) 155 (H)  (H): Data is abnormally high  Diabetes history: DM2 Outpatient Diabetes medications: Basaglar  17 units BID, Humalog  2-10 units TID Current orders for Inpatient glycemic control: Semglee  19 units BID, Novolog  0-15 TID and 0-5 units QHS  Inpatient Diabetes Program Recommendations:    Please consider:  Novolog  3 units TID with meals if she eats at least 50%.  Thank you, Wyvonna Pinal, MSN, CDCES Diabetes Coordinator Inpatient Diabetes Program [phone] (team pager from 8a-5p)

## 2024-02-01 NOTE — BHH Group Notes (Signed)
 Adult Psychoeducational Group Note  Date:  02/01/2024 Time:  9:41 PM  Group Topic/Focus:  Wrap-Up Group:   The focus of this group is to help patients review their daily goal of treatment and discuss progress on daily workbooks.  Participation Level:  Active  Participation Quality:  Appropriate  Affect:  Appropriate  Cognitive:  Appropriate  Insight: Appropriate  Engagement in Group:  Engaged  Modes of Intervention:  Discussion and Support  Additional Comments:  Pt attended the evening AA meeting.  Shavaun Osterloh Lee 02/01/2024, 9:41 PM

## 2024-02-01 NOTE — Progress Notes (Signed)
(  Sleep Hours) - 6.5 (Any PRNs that were needed, meds refused, or side effects to meds)- trazodone  (Any disturbances and when (visitation, over night)- none (Concerns raised by the patient)- none (SI/HI/AVH)- denies all

## 2024-02-01 NOTE — BH IP Treatment Plan (Signed)
 Interdisciplinary Treatment and Diagnostic Plan Update  02/01/2024 Time of Session: 10:10AM Kathryn [name] MRN: [id number]  Principal Diagnosis: Schizoaffective disorder, depressive type (HCC)  Secondary Diagnoses: Principal Problem:   Schizoaffective disorder, depressive type (HCC)   Current Medications:  Current Facility-Administered Medications  Medication Dose Route Frequency Provider Last Rate Last Admin   acetaminophen  (TYLENOL ) tablet 650 mg  650 mg Oral Q6H PRN Chien, Stephanie, MD   650 mg at 01/31/24 0553   albuterol  (VENTOLIN  HFA) 108 (90 Base) MCG/ACT inhaler 2 puff  2 puff Inhalation Q6H PRN Graham Krabbe, MD       alum & mag hydroxide-simeth (MAALOX/MYLANTA) 200-200-20 MG/5ML suspension 30 mL  30 mL Oral Q4H PRN Chien, Stephanie, MD       amLODipine  (NORVASC ) tablet 5 mg  5 mg Oral Daily Towana Leita SAILOR, MD   5 mg at 02/01/24 9043   aspirin  EC tablet 81 mg  81 mg Oral Daily Chien, Stephanie, MD   81 mg at 02/01/24 0800   atorvastatin  (LIPITOR) tablet 10 mg  10 mg Oral Daily Chien, Stephanie, MD   10 mg at 02/01/24 0800   benztropine  (COGENTIN ) tablet 1 mg  1 mg Oral QHS Chien, Stephanie, MD   1 mg at 01/31/24 2057   haloperidol  (HALDOL ) tablet 5 mg  5 mg Oral TID PRN Chien, Stephanie, MD       And   diphenhydrAMINE  (BENADRYL ) capsule 50 mg  50 mg Oral TID PRN Chien, Stephanie, MD       DULoxetine  (CYMBALTA ) DR capsule 60 mg  60 mg Oral BID Chien, Stephanie, MD   60 mg at 02/01/24 0800   ferrous sulfate  tablet 325 mg  325 mg Oral Q breakfast Chien, Stephanie, MD   325 mg at 02/01/24 0800   hydrOXYzine  (ATARAX ) tablet 25 mg  25 mg Oral TID PRN Chien, Stephanie, MD       insulin  aspart (novoLOG ) injection 0-15 Units  0-15 Units Subcutaneous TID WC Chien, Stephanie, MD   5 Units at 02/01/24 1209   insulin  aspart (novoLOG ) injection 0-5 Units  0-5 Units Subcutaneous QHS Chien, Stephanie, MD   3 Units at 01/31/24 2056   insulin  glargine-yfgn (SEMGLEE ) injection 19 Units  19  Units Subcutaneous BID Towana Leita SAILOR, MD   19 Units at 02/01/24 0802   levothyroxine  (SYNTHROID ) tablet 75 mcg  75 mcg Oral QAC breakfast Chien, Stephanie, MD   75 mcg at 02/01/24 9371   linaclotide  (LINZESS ) capsule 145 mcg  145 mcg Oral QAC breakfast Towana Leita SAILOR, MD   145 mcg at 02/01/24 9371   losartan  (COZAAR ) tablet 100 mg  100 mg Oral Daily Chien, Stephanie, MD   100 mg at 02/01/24 0800   magnesium  hydroxide (MILK OF MAGNESIA) suspension 30 mL  30 mL Oral Daily PRN Graham Krabbe, MD   30 mL at 01/31/24 0801   risperiDONE  (RISPERDAL ) tablet 6 mg  6 mg Oral QHS Chien, Stephanie, MD   6 mg at 01/31/24 2057   traZODone  (DESYREL ) tablet 50 mg  50 mg Oral QHS PRN Chien, Stephanie, MD   50 mg at 01/31/24 2057   PTA Medications: Medications Prior to Admission  Medication Sig Dispense Refill Last Dose/Taking   acetaminophen  (TYLENOL ) 650 MG CR tablet Take 650 mg by mouth every 8 (eight) hours as needed for pain.      albuterol  (VENTOLIN  HFA) 108 (90 Base) MCG/ACT inhaler Inhale 2 puffs into the lungs every 6 (six) hours as needed for  wheezing or shortness of breath. 8 g 2    aspirin  EC 81 MG tablet Take 81 mg by mouth daily.      atorvastatin  (LIPITOR) 10 MG tablet Take 1 tablet (10 mg total) by mouth daily. 90 tablet 2    benztropine  (COGENTIN ) 1 MG tablet Take 1 tablet (1 mg total) by mouth at bedtime. 90 tablet 0    Blood Glucose Monitoring Suppl (TRUE METRIX METER) w/Device KIT Use as directed to monitor blood sugar. 1 kit 0    Cholecalciferol (VITAMIN D -3) 125 MCG (5000 UT) TABS Take 2 tablets by mouth daily. (Patient taking differently: Take 10,000 Units by mouth daily.) 30 tablet 1    clonazePAM  (KLONOPIN ) 0.5 MG tablet Take 1 tablet (0.5 mg total) by mouth 2 (two) times daily as needed for anxiety. 60 tablet 0    DULoxetine  (CYMBALTA ) 60 MG capsule Take 1 capsule (60 mg total) by mouth 2 (two) times daily. 180 capsule 2    ferrous sulfate  325 (65 FE) MG tablet Take 325 mg by mouth  daily with breakfast.      gabapentin  (NEURONTIN ) 300 MG capsule Take 1 capsule (300 mg total) by mouth daily. 90 capsule 2    Glucagon  (GVOKE HYPOPEN  2-PACK) 1 MG/0.2ML SOAJ Inject 1 mg into the skin daily as needed. 0.2 mL 1    glucose blood (TRUE METRIX BLOOD GLUCOSE TEST) test strip Use as instructed to monitor blood sugar twice daily 50 each 11    Insulin  Glargine (BASAGLAR  KWIKPEN) 100 UNIT/ML Inject 19 Units into the skin 2 (two) times daily. May increase to a maximum total daily dose of 40 units. 12 mL 4    insulin  lispro (HUMALOG ) 100 UNIT/ML KwikPen Take before each meal 3 times a day, 140-199 - 2 units, 200-250 - 4 units, 251-299 - 6 units, 300-349 - 8 units, 350 or above 10 units (Patient taking differently: Inject 2-10 Units into the skin 3 (three) times daily before meals. Take before each meal 3 times a day, 140-199 - 2 units, 200-250 - 4 units, 251-299 - 6 units, 300-349 - 8 units, 350 or above 10 units) 15 mL 3    Insulin  Pen Needle (PEN NEEDLES) 31G X 6 MM MISC For 4 times a day insulin , 1 month supply. Diagnosis E11.65 100 each 0    levothyroxine  (SYNTHROID ) 75 MCG tablet Take 1 tablet (75 mcg total) by mouth daily before breakfast. 90 tablet 1    losartan  (COZAAR ) 100 MG tablet Take 1 tablet (100 mg total) by mouth daily. 90 tablet 3    Multiple Vitamin (MULTIVITAMIN) tablet Take 1 tablet by mouth daily.      risperiDONE  (RISPERDAL ) 2 MG tablet TAKE 1 TABLET EVERY MORNING AND 3 TABLETS AT BEDTIME (Patient taking differently: Take 2-6 mg by mouth in the morning and at bedtime. Take one tablet every morning and three tablets at bedtime) 360 tablet 3    rizatriptan  (MAXALT ) 10 MG tablet Take 1 tablet (10 mg total) by mouth as needed for migraine. May repeat in 2 hours if needed 10 tablet 2    topiramate  (TOPAMAX ) 100 MG tablet TAKE 1 TABLET BY MOUTH EVERYDAY AT BEDTIME (Patient taking differently: Take 100 mg by mouth at bedtime.) 90 tablet 1    traZODone  (DESYREL ) 100 MG tablet Take 2  tablets (200 mg total) by mouth at bedtime. (Patient taking differently: Take 200 mg by mouth at bedtime as needed for sleep.) 180 tablet 2    TRUEplus Lancets  30G MISC Use as directed to monitor blood sugar twice daily 100 each 3     Patient Stressors: Financial difficulties   Loss of granddaughter    Patient Strengths: Ability for insight  Supportive family/friends   Treatment Modalities: Medication Management, Group therapy, Case management,  1 to 1 session with clinician, Psychoeducation, Recreational therapy.   Physician Treatment Plan for Primary Diagnosis: Schizoaffective disorder, depressive type (HCC) Long Term Goal(s):     Short Term Goals:    Medication Management: Evaluate patient's response, side effects, and tolerance of medication regimen.  Therapeutic Interventions: 1 to 1 sessions, Unit Group sessions and Medication administration.  Evaluation of Outcomes: Not Progressing  Physician Treatment Plan for Secondary Diagnosis: Principal Problem:   Schizoaffective disorder, depressive type (HCC)  Long Term Goal(s):     Short Term Goals:       Medication Management: Evaluate patient's response, side effects, and tolerance of medication regimen.  Therapeutic Interventions: 1 to 1 sessions, Unit Group sessions and Medication administration.  Evaluation of Outcomes: Not Progressing   RN Treatment Plan for Primary Diagnosis: Schizoaffective disorder, depressive type (HCC) Long Term Goal(s): Knowledge of disease and therapeutic regimen to maintain health will improve  Short Term Goals: Ability to remain free from injury will improve, Ability to verbalize frustration and anger appropriately will improve, Ability to demonstrate self-control, Ability to participate in decision making will improve, Ability to verbalize feelings will improve, Ability to disclose and discuss suicidal ideas, and Ability to identify and develop effective coping behaviors will  improve  Medication Management: RN will administer medications as ordered by provider, will assess and evaluate patient's response and provide education to patient for prescribed medication. RN will report any adverse and/or side effects to prescribing provider.  Therapeutic Interventions: 1 on 1 counseling sessions, Psychoeducation, Medication administration, Evaluate responses to treatment, Monitor vital signs and CBGs as ordered, Perform/monitor CIWA, COWS, AIMS and Fall Risk screenings as ordered, Perform wound care treatments as ordered.  Evaluation of Outcomes: Not Progressing   LCSW Treatment Plan for Primary Diagnosis: Schizoaffective disorder, depressive type (HCC) Long Term Goal(s): Safe transition to appropriate next level of care at discharge, Engage patient in therapeutic group addressing interpersonal concerns.  Short Term Goals: Engage patient in aftercare planning with referrals and resources, Increase social support, Increase ability to appropriately verbalize feelings, Increase emotional regulation, Facilitate acceptance of mental health diagnosis and concerns, Facilitate patient progression through stages of change regarding substance use diagnoses and concerns, and Identify triggers associated with mental health/substance abuse issues  Therapeutic Interventions: Assess for all discharge needs, 1 to 1 time with Social worker, Explore available resources and support systems, Assess for adequacy in community support network, Educate family and significant other(s) on suicide prevention, Complete Psychosocial Assessment, Interpersonal group therapy.  Evaluation of Outcomes: Not Progressing   Progress in Treatment: Attending groups: Yes. Participating in groups: Yes. Taking medication as prescribed: Yes. Toleration medication: Yes. Family/Significant other contact made: No, will contact:  Kathryn [name] (husband) [phone], Kathryn [name] (daughter) [phone], Kathryn [name]( daughter)  [phone] Patient understands diagnosis: Yes. Discussing patient identified problems/goals with staff: Yes. Medical problems stabilized or resolved: Yes. Denies suicidal/homicidal ideation: Yes. Issues/concerns per patient self-inventory: No.  Patient Goals:  to control suicidal thoughts and emotions and to actually be able to talk about my emotions.  Discharge Plan or Barriers: Patient will likely discharge home with family once stable.   Reason for Continuation of Hospitalization: Depression Medication stabilization  Estimated Length of Stay: 5-7 days  Last 3  [place] Suicide Severity Risk Score: Flowsheet Row Ad[facility] (Current) [facility] from [facility]/29/2024 in [facility] to Hosp-Admission (Discharged) from 11/23/2023 in [facility]  C-SSRS RISK CATEGORY High Risk High Risk No Risk    Last PHQ 2/9 Scores:    01/29/2024    1:36 PM 01/29/2024   10:21 AM 01/22/2024   10:03 AM  Depression screen PHQ 2/9  Decreased Interest 3 3 0  Down, Depressed, Hopeless 3 3 1   PHQ - 2 Score 6 6 1   Altered sleeping 3 3   Tired, decreased energy 3 3   Change in appetite 1 3   Feeling bad or failure about yourself  2 3   Trouble concentrating 3 3   Moving slowly or fidgety/restless 1 3   Suicidal thoughts 3 1   PHQ-9 Score 22 25   Difficult doing work/chores Very difficult Extremely dIfficult     Scribe for Treatment Team: Rodrigo Mcgranahan M Lamir Racca, [place]RA[place] 02/01/2024 1:21 PM

## 2024-02-02 ENCOUNTER — Telehealth: Payer: Self-pay

## 2024-02-02 LAB — GLUCOSE, CAPILLARY
Glucose-Capillary: 129 mg/dL — ABNORMAL HIGH (ref 70–99)
Glucose-Capillary: 149 mg/dL — ABNORMAL HIGH (ref 70–99)
Glucose-Capillary: 257 mg/dL — ABNORMAL HIGH (ref 70–99)
Glucose-Capillary: 275 mg/dL — ABNORMAL HIGH (ref 70–99)

## 2024-02-02 MED ORDER — LITHIUM CARBONATE ER 300 MG PO TBCR
300.0000 mg | EXTENDED_RELEASE_TABLET | Freq: Every day | ORAL | Status: DC
  Administered 2024-02-02 – 2024-02-05 (×4): 300 mg via ORAL
  Filled 2024-02-02 (×4): qty 1

## 2024-02-02 NOTE — Telephone Encounter (Signed)
 Copied from CRM [id number]. Topic: Appointments - Appointment Info/Confirmation >> Feb 01, 2024  4:01 PM Montie POUR wrote: Patient/patient representative is calling for information regarding an appointment.  Darneshia is at [facility]; [address] [place], [place] [address]; Phone: [phone] Please call Feleshia Zundel at [phone] with any questions.

## 2024-02-02 NOTE — Group Note (Signed)
 Recreation Therapy Group Note   Group Topic:Animal Assisted Therapy   Group Date: 02/02/2024 Start Time: 0947 End Time: 1030 Facilitators: Navada Osterhout-McCall, LRT,CTRS Location: 300 [address] Dayroom   Animal-Assisted Activity (AAA) Program Checklist/Progress Notes Patient Eligibility Criteria Checklist & Daily Group note for Rec Tx Intervention  AAA/T Program Assumption of Risk Form signed by Patient/ or Parent Legal Guardian Yes  Patient is free of allergies or severe asthma Yes  Patient reports no fear of animals Yes  Patient reports no history of cruelty to animals Yes  Patient understands his/her participation is voluntary Yes  Patient washes hands before animal contact Yes  Patient washes hands after animal contact Yes  Behavioral Response: Engaged   Education: Charity fundraiser, Appropriate Animal Interaction   Education Outcome: Acknowledges education.    Affect/Mood: Appropriate   Participation Level: Engaged   Participation Quality: Independent   Behavior: Appropriate   Speech/Thought Process: Focused   Insight: Good   Judgement: Good   Modes of Intervention: Teaching laboratory technician   Patient Response to Interventions:  Engaged   Education Outcome:  In group clarification offered    Clinical Observations/Individualized Feedback: Patient attended session and interacted appropriately with therapy dog and peers. Patient asked appropriate questions about therapy dog and his training. Patient shared stories about their pets at home with group.    Plan: Continue to engage patient in RT group sessions 2-3x/week.   Alexx Giambra-McCall, LRT,CTRS 02/02/2024 12:24 PM
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
## 2024-02-02 NOTE — Group Note (Signed)
 LCSW Group Therapy Note   Group Date: 02/02/2024 Start Time: 1100 End Time: 1200   Participation:  did not attend  Type of Therapy:  Group Therapy   Topic:  Stronger Together:  Building Healthy Relationships  Objective:  To explore loneliness, boundaries, and safe ways to build relationships.  Goals: Recognize healthy vs. unhealthy relationships. Learn safe ways to connect with others. Strengthen communication and [employer].  Summary:  Participants discussed loneliness, healthy connections, and setting boundaries. They explored safe ways to meet people and shared personal experiences. Key insights were reinforced through discussion and quotes.  Therapeutic Modalities Used: Cognitive Behavioral Therapy (CBT) Elements - Identifying unhealthy relationship patterns, challenging negative thoughts about connection. Dialectical Behavior Therapy (DBT) Elements - Interpersonal effectiveness, setting and maintaining boundaries. Supportive Group Therapy - Peer discussion, shared experiences, and emotional validation.   Aleka Twitty O Keir Viernes, LCSWA 02/02/2024  4:46 PM

## 2024-02-02 NOTE — Group Note (Signed)
 Date:  02/02/2024 Time:  11:08 AM  Group Topic/Focus:  Goals Group:   The focus of this group is to help patients establish daily goals to achieve during treatment and discuss how the patient can incorporate goal setting into their daily lives to aide in recovery.    Participation Level:  Active  Participation Quality:  Attentive  Affect:  Appropriate  Cognitive:  Alert  Insight: Good  Engagement in Group:  Engaged  Modes of Intervention:  Education  Additional Comments:    Kathryn [name] 02/02/2024, 11:08 AM

## 2024-02-02 NOTE — Progress Notes (Addendum)
 Pt reports that they slept good last night. Pt continues to endorse passive SI and AVH but reports that they are better today. Pt reports that her goal for today is to get her energy levels up. Pt has been interactive on the unit and participating in groups throughout the day. Pt has been calm and cooperative throughout the day. Patient has been compliant with medications and treatment plan. Q 15 minute safety checks are in place for patient's safety. Patient is currently safe on the unit.   02/02/24 0916  Psych Admission Type (Psych Patients Only)  Admission Status Voluntary  Psychosocial Assessment  Patient Complaints Self-harm thoughts;Anxiety;Depression  Eye Contact Fair  Facial Expression Flat;Sad  Affect Anxious;Depressed  Speech Logical/coherent  Interaction Assertive  Motor Activity Slow  Appearance/Hygiene Unremarkable  Behavior Characteristics Cooperative;Appropriate to situation  Mood Pleasant;Depressed  Thought Process  Coherency WDL  Content WDL  Delusions None reported or observed  Perception WDL  Hallucination Auditory  Judgment Impaired  Confusion None  Danger to Self  Current suicidal ideation? Passive (I am not having a lot of thoughts yet but they come later in the day)  Description of Suicide Plan No plan verbalized  Self-Injurious Behavior Some self-injurious ideation observed or expressed.  No lethal plan expressed   Agreement Not to Harm Self Yes  Description of Agreement Pt verbally contracts for safety  Danger to Others  Danger to Others None reported or observed

## 2024-02-02 NOTE — [facility] Suicide Risk Assessment (Signed)
 [facility] INPATIENT:  Family/Significant Other Suicide Prevention Education  Suicide Prevention Education:  Education Completed; Kathryn [name] (husband) [phone],  (name of family member/significant other) has been identified by the patient as the family member/significant other with whom the patient will be residing, and identified as the person(s) who will aid the patient in the event of a mental health crisis (suicidal ideations/suicide attempt).  With written consent from the patient, the family member/significant other has been provided the following suicide prevention education, prior to the and/or following the discharge of the patient.  Kathryn [name] (husband) confirmed patient will not have access to firearms/guns/weapons to harm herself or others after discharge. Kathryn [name] reported he's willing to assist patient should she have a mental health crisis. Kathryn [name] reported being willing to assist patient with safely storing and securing medications. Kathryn [name] denied having any safety concerns related to patient discharging home.   The suicide prevention education provided includes the following: Suicide risk factors Suicide prevention and interventions National Suicide Hotline telephone number [facility] assessment telephone number [facility] Emergency Assistance 911 [facility] and/or Residential Mobile Crisis Unit telephone number  Request made of family/significant other to: Remove weapons (e.g., guns, rifles, knives), all items previously/currently identified as safety concern.   Remove drugs/medications (over-the-counter, prescriptions, illicit drugs), all items previously/currently identified as a safety concern.  The family member/significant other verbalizes understanding of the suicide prevention education information provided.  The family member/significant other agrees to remove the items of safety concern listed above.  Kathryn [name] Kathryn [name], LCSWA 02/02/2024, 10:05 AM

## 2024-02-02 NOTE — Plan of Care (Signed)
   Problem: Education: Goal: Knowledge of [facility] Education information/materials will improve Outcome: Progressing Goal: Emotional status will improve Outcome: Progressing Goal: Mental status will improve Outcome: Progressing Goal: Verbalization of understanding the information provided will improve Outcome: Progressing   Problem: Activity: Goal: Interest or engagement in activities will improve Outcome: Progressing

## 2024-02-02 NOTE — Progress Notes (Signed)
 Shift Note  (Sleep Hours) - 8.25  (Any PRNs that were needed, meds refused, or side effects to meds)- PO PRN Trazodone  given  (Any disturbances and when (visitation, over night)-None  (Concerns raised by the patient)- None  (SI/HI/AVH)- Passive, verbally contract for safety, denies plan

## 2024-02-02 NOTE — Group Note (Signed)
 Date:  02/02/2024 Time:  9:11 PM  Group Topic/Focus:  Wrap-Up Group:   The focus of this group is to help patients review their daily goal of treatment and discuss progress on daily workbooks.    Participation Level:  Active  Participation Quality:  Appropriate and Sharing  Affect:  Appropriate and Flat  Cognitive:  Appropriate  Insight: Appropriate  Engagement in Group:  Engaged  Modes of Intervention:  Activity and Socialization  Additional Comments:  ***  Eward Mace 02/02/2024, 9:11 PM

## 2024-02-03 ENCOUNTER — Other Ambulatory Visit: Payer: Self-pay

## 2024-02-03 LAB — GLUCOSE, CAPILLARY
Glucose-Capillary: 219 mg/dL — ABNORMAL HIGH (ref 70–99)
Glucose-Capillary: 250 mg/dL — ABNORMAL HIGH (ref 70–99)
Glucose-Capillary: 250 mg/dL — ABNORMAL HIGH (ref 70–99)
Glucose-Capillary: 236 mg/dL — ABNORMAL HIGH (ref 70–99)

## 2024-02-03 NOTE — [facility] Group Notes (Signed)
 Adult Psychoeducational Group Note  Date:  02/03/2024 Time:  8:28 PM  Group Topic/Focus:  Wrap-Up Group:   The focus of this group is to help patients review their daily goal of treatment and discuss progress on daily workbooks.  Participation Level:  Active  Participation Quality:  Appropriate  Affect:  Appropriate  Cognitive:  Appropriate  Insight: Appropriate  Engagement in Group:  Engaged  Modes of Intervention:  Discussion  Additional Comments:  Reese atttend wrap up group  Lang Drilling Long 02/03/2024, 8:28 PM

## 2024-02-03 NOTE — Progress Notes (Addendum)
 I met with Kathryn [name] to provide grief support around the loss of her granddaughter. She shared that she doesn't know how to grieve and we explored some of the things that might be contributing to her feeling stuck including the traumatic way the loss occurred, the fact that the trial has not happened yet, and the guilt that she feels. I normalized guilt as a common grief response and facilitated conversation around how to release that.  She was able to share some beautiful memories with me about her granddaughter and the special connection that she had with her.  She expressed appreciation for being able to talk openly and I encouraged her to continue to share about her granddaughter and to consider therapy as well.

## 2024-02-03 NOTE — Group Note (Signed)
 Recreation Therapy Group Note   Group Topic:Communication  Group Date: 02/03/2024 Start Time: 0930 End Time: 1005 Facilitators: Korie Streat-McCall, LRT,CTRS Location: [address] Dayroom   Group Topic: Communication, Problem Solving   Goal Area(s) Addresses:  Patient will effectively listen to complete activity.  Patient will identify communication skills used to make activity successful.  Patient will identify how skills used during activity can be used to reach post d/c goals.    Behavioral Response: Engaged  Intervention: Building surveyor Activity - Geometric pattern cards, pencils, blank paper    Activity: Geometric Drawings.  Three volunteers from the peer group will be shown an abstract picture with a particular arrangement of geometrical shapes.  Each round, one 'speaker' will describe the pattern, as accurately as possible without revealing the image to the group.  The remaining group members will listen and draw the picture to reflect how it is described to them. Patients with the role of 'listener' cannot ask clarifying questions but, may request that the speaker repeat a direction. Once the drawings are complete, the presenter will show the rest of the group the picture and compare how close each person came to drawing the picture. LRT will facilitate a post-activity discussion regarding effective communication and the importance of planning, listening, and asking for clarification in daily interactions with others.  Education: Environmental consultant, Active listening, Support systems, Discharge planning  Education Outcome: Acknowledges understanding/In group clarification offered/Needs additional education.    Affect/Mood: Appropriate   Participation Level: Engaged   Participation Quality: Independent   Behavior: Appropriate   Speech/Thought Process: Focused   Insight: Good   Judgement: Good   Modes of Intervention: Activity   Patient Response to  Interventions:  Engaged   Education Outcome:  In group clarification offered    Clinical Observations/Individualized Feedback: Pt was active and attentive during group. Pt was the last presenter. Pt focused but made a few missteps in explaining her drawing. Pt was still focused on completing task.      Plan: Continue to engage patient in RT group sessions 2-3x/week.   Shlomo Seres-McCall, LRT,CTRS 02/03/2024 11:48 AM

## 2024-02-03 NOTE — Progress Notes (Signed)
   02/03/24 0835  Psych Admission Type (Psych Patients Only)  Admission Status Voluntary  Psychosocial Assessment  Patient Complaints Anxiety  Eye Contact Fair  Facial Expression Anxious  Affect Appropriate to circumstance  Speech Logical/coherent  Interaction Assertive  Motor Activity Slow  Appearance/Hygiene Unremarkable  Behavior Characteristics Cooperative  Mood Depressed;Anxious  Thought Process  Coherency WDL  Content WDL  Delusions None reported or observed  Perception WDL  Hallucination None reported or observed  Judgment Poor  Confusion None  Danger to Self  Current suicidal ideation? Passive  Description of Suicide Plan no plan  Agreement Not to Harm Self Yes  Description of Agreement verbal  Danger to Others  Danger to Others None reported or observed

## 2024-02-03 NOTE — Plan of Care (Signed)
  Problem: Education: Goal: Emotional status will improve Outcome: Progressing Goal: Mental status will improve Outcome: Progressing   Problem: Activity: Goal: Interest or engagement in activities will improve Outcome: Progressing Goal: Sleeping patterns will improve Outcome: Progressing   Problem: Health Behavior/Discharge Planning: Goal: Compliance with treatment plan for underlying cause of condition will improve Outcome: Progressing   Problem: Health Behavior/Discharge Planning: Goal: Compliance with treatment plan for underlying cause of condition will improve Outcome: Progressing   Problem: Safety: Goal: Periods of time without injury will increase Outcome: Progressing

## 2024-02-03 NOTE — Group Note (Signed)
 Date:  02/03/2024 Time:  9:14 AM  Group Topic/Focus:  Goals Group:   The focus of this group is to help patients establish daily goals to achieve during treatment and discuss how the patient can incorporate goal setting into their daily lives to aide in recovery. Orientation:   The focus of this group is to educate the patient on the purpose and policies of crisis stabilization and provide a format to answer questions about their admission.  The group details unit policies and expectations of patients while admitted.    Participation Level:  Active  Participation Quality:  Appropriate  Affect:  Appropriate  Cognitive:  Appropriate  Insight: Appropriate  Engagement in Group:  Engaged  Modes of Intervention:  Discussion  Additional Comments:  Pt goal Have no suicidal thoughts, no naps. Drink water , and socialize more today  Adriana GORMAN Blush 02/03/2024, 9:14 AM
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
## 2024-02-03 NOTE — Group Note (Signed)
 Date:  02/03/2024 Time:  4:32 PM  Group Topic/Focus:  Personal Choices and Values:   The focus of this group is to help patients assess and explore the importance of values in their lives, how their values affect their decisions, how they express their values and what opposes their expression. Recovery Goals:   The focus of this group is to identify appropriate goals for recovery and establish a plan to achieve them.    Participation Level:  Active  Participation Quality:  Appropriate, Sharing, and Supportive  Affect:  Appropriate  Cognitive:  Appropriate  Insight: Appropriate  Engagement in Group:  Engaged and Supportive  Modes of Intervention:  Discussion and Support  Additional Comments:  Patient was engaged throughout group and participated appropriately.   Kathryn [name] 02/03/2024, 4:32 PM

## 2024-02-03 NOTE — Inpatient Diabetes Management (Signed)
 Inpatient Diabetes Program Recommendations  AACE/ADA: New Consensus Statement on Inpatient Glycemic Control (2015)  Target Ranges:  Prepandial:   less than 140 mg/dL      Peak postprandial:   less than 180 mg/dL (1-2 hours)      Critically ill patients:  140 - 180 mg/dL   Lab Results  Component Value Date   GLUCAP 250 (H) 02/03/2024   HGBA1C 8.5 (H) 01/29/2024    Review of Glycemic Control  Latest Reference Range & Units 02/03/24 06:16 02/03/24 11:21  Glucose-Capillary 70 - 99 mg/dL 780 (H) 749 (H)  (H): Data is abnormally high  Diabetes history: DM Outpatient Diabetes medications: Basaglar  17 BID, Humalog  2-10 TID  Current orders for Inpatient glycemic control: Semglee  19 units BID, Novolog  0-15 units TID and 0-9 units QHS  Inpatient Diabetes Program Recommendations:    If postprandials remain elevated, might consider:  Novolog  3 units TID with meals if she consumes at least 50%.  Thank you, Kathryn Pinal, MSN, CDCES Diabetes Coordinator Inpatient Diabetes Program [phone] (team pager from 8a-5p)

## 2024-02-04 LAB — GLUCOSE, CAPILLARY
Glucose-Capillary: 140 mg/dL — ABNORMAL HIGH (ref 70–99)
Glucose-Capillary: 135 mg/dL — ABNORMAL HIGH (ref 70–99)
Glucose-Capillary: 172 mg/dL — ABNORMAL HIGH (ref 70–99)
Glucose-Capillary: 249 mg/dL — ABNORMAL HIGH (ref 70–99)

## 2024-02-04 MED ORDER — NAPHAZOLINE-GLYCERIN 0.012-0.25 % OP SOLN
1.0000 [drp] | Freq: Four times a day (QID) | OPHTHALMIC | Status: DC | PRN
  Administered 2024-02-04: 1 [drp] via OPHTHALMIC
  Filled 2024-02-04: qty 15

## 2024-02-04 NOTE — Group Note (Unsigned)
 Date:  02/05/2024 Time:  2:01 AM  Group Topic/Focus:  Wrap-Up Group:   The focus of this group is to help patients review their daily goal of treatment and discuss progress on daily workbooks.    Participation Level:  Active  Participation Quality:  Appropriate and Sharing  Affect:  Appropriate  Cognitive:  Appropriate  Insight: Appropriate  Engagement in Group:  Engaged  Modes of Intervention:  Activity and Socialization  Additional Comments:  Patient activity participated and engaged in wrap up group. Patient shared about her day and her goals for today.   Eward Mace 02/05/2024, 2:01 AM

## 2024-02-04 NOTE — Plan of Care (Signed)
   Problem: Education: Goal: Knowledge of [facility] Education information/materials will improve Outcome: Progressing Goal: Emotional status will improve Outcome: Progressing Goal: Mental status will improve Outcome: Progressing

## 2024-02-04 NOTE — [facility] Counselor (Signed)
 CSW reached out to patient's husband, [name] [phone], regarding transportation coordination for discharge on 02/05/24. Left message asking [name] to return call.  Natika Geyer, LCSWA 02/04/24 3:33PM

## 2024-02-04 NOTE — Group Note (Signed)
 Occupational Therapy Group Note   Group Topic:Goal Setting  Group Date: 02/04/2024 Start Time: 1500 End Time: 1530 Facilitators: Dot [place] MATSU, OT   Group Description: Group encouraged engagement and participation through discussion focused on goal setting. Group members were introduced to goal-setting using the SMART Goal framework, identifying goals as Specific, Measureable, Acheivable, Relevant, and Time-Bound. Group members took time from group to create their own personal goal reflecting the SMART goal template and shared for review by peers and OT.    Therapeutic Goal(s):  Identify at least one goal that fits the SMART framework    Participation Level: Engaged   Participation Quality: Independent   Behavior: Appropriate   Speech/Thought Process: Relevant   Affect/Mood: Appropriate   Insight: Fair   Judgement: Fair      Modes of Intervention: Education  Patient Response to Interventions:  Attentive   Plan: Continue to engage patient in OT groups 2 - 3x/week.  02/04/2024  [place] MATSU Dot, OT  Kathryn [name], OT

## 2024-02-04 NOTE — Progress Notes (Signed)
(  Sleep Hours) -9.0 (Any PRNs that were needed, meds refused, or side effects to meds)- prn trazodone  @ 2114 (Any disturbances and when (visitation, over night)-none (Concerns raised by the patient)- none (SI/HI/AVH)- Denies all

## 2024-02-04 NOTE — Group Note (Signed)
 LCSW Group Therapy Note   Group Date: 02/04/2024 Start Time: 1100 End Time: 1200   Participation:  did not attend  Type of Therapy:  Group Therapy  Topic:  Lifestyle:  from "One Day" to "Today is Day One"  Objective:  To promote mental and physical well-being through lifestyle changes in routine, nutrition, sleep, and movement.  Goals: Increase awareness of how lifestyle habits impact mental health. Encourage one small, achievable wellness goal. Support group sharing and accountability.  Summary:  Group members explored how daily habits influence mental health and discussed the importance of starting with small, manageable changes. Participants identified personal goals and shared reflections on improving structure, sleep, diet, and physical activity.  Therapeutic Modalities: CBT - Identifying and challenging all-or-nothing thinking; promoting realistic, helpful thoughts about change. Psychoeducation - Teaching about the impact of sleep, nutrition, movement, and routine on mental health. Motivational Interviewing - Eliciting personal motivation and exploring readiness for change. Goal-Setting - Supporting SMART goals to build self-efficacy and encourage follow-through.   Anthonee Gelin O Mariel Lukins, LCSWA 02/04/2024  12:23 PM

## 2024-02-04 NOTE — Group Note (Signed)
 Date:  02/04/2024 Time:  5:19 PM  Group Topic/Focus:  Coping With Mental Health Crisis:   The purpose of this group is to help patients identify strategies for coping with mental health crisis.  Group discusses possible causes of crisis and ways to manage them effectively. Developing a Wellness Toolbox:   The focus of this group is to help patients develop a wellness toolbox with skills and strategies to promote recovery upon discharge. Wellness Toolbox:   The focus of this group is to discuss various aspects of wellness, balancing those aspects and exploring ways to increase the ability to experience wellness.  Patients will create a wellness toolbox for use upon discharge.    Participation Level:  Active  Participation Quality:  Appropriate  Affect:  Appropriate  Cognitive:  Appropriate  Insight: Appropriate  Engagement in Group:  Engaged  Modes of Intervention:  Discussion  Additional Comments:    Eleanor JAYSON Metro 02/04/2024, 5:19 PM

## 2024-02-04 NOTE — Plan of Care (Signed)
  Problem: Education: Goal: Emotional status will improve Outcome: Progressing Goal: Mental status will improve Outcome: Progressing Goal: Verbalization of understanding the information provided will improve Outcome: Progressing   Problem: Activity: Goal: Interest or engagement in activities will improve Outcome: Progressing   Problem: Health Behavior/Discharge Planning: Goal: Compliance with treatment plan for underlying cause of condition will improve Outcome: Progressing   Problem: Safety: Goal: Periods of time without injury will increase Outcome: Progressing
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
## 2024-02-04 NOTE — Progress Notes (Signed)
   02/04/24 0732  Psych Admission Type (Psych Patients Only)  Admission Status Voluntary  Psychosocial Assessment  Patient Complaints None  Eye Contact Fair  Facial Expression Flat  Affect Appropriate to circumstance  Speech Logical/coherent  Interaction Assertive  Motor Activity Slow  Appearance/Hygiene Unremarkable  Behavior Characteristics Cooperative;Appropriate to situation  Mood Pleasant;Depressed  Thought Process  Coherency WDL  Content WDL  Delusions None reported or observed  Perception WDL  Hallucination None reported or observed  Judgment Poor  Confusion None  Danger to Self  Current suicidal ideation? Passive  Description of Suicide Plan no plan  Agreement Not to Harm Self Yes  Description of Agreement verbal  Danger to Others  Danger to Others None reported or observed

## 2024-02-05 ENCOUNTER — Other Ambulatory Visit: Payer: Self-pay

## 2024-02-05 LAB — GLUCOSE, CAPILLARY: Glucose-Capillary: 120 mg/dL — ABNORMAL HIGH (ref 70–99)

## 2024-02-05 LAB — LITHIUM LEVEL: Lithium Lvl: 0.32 mmol/L — ABNORMAL LOW (ref 0.60–1.20)

## 2024-02-05 MED ORDER — RISPERIDONE 3 MG PO TABS
6.0000 mg | ORAL_TABLET | Freq: Every day | ORAL | 0 refills | Status: DC
  Filled 2024-02-05: qty 60, 30d supply, fill #0

## 2024-02-05 MED ORDER — TRAZODONE HCL 50 MG PO TABS
50.0000 mg | ORAL_TABLET | Freq: Every evening | ORAL | 0 refills | Status: DC | PRN
  Filled 2024-02-05: qty 30, 30d supply, fill #0

## 2024-02-05 MED ORDER — AMLODIPINE BESYLATE 5 MG PO TABS
5.0000 mg | ORAL_TABLET | Freq: Every day | ORAL | 0 refills | Status: DC
  Filled 2024-02-05: qty 30, 30d supply, fill #0

## 2024-02-05 MED ORDER — LITHIUM CARBONATE ER 300 MG PO TBCR
300.0000 mg | EXTENDED_RELEASE_TABLET | Freq: Every day | ORAL | 0 refills | Status: DC
  Filled 2024-02-05: qty 30, 30d supply, fill #0

## 2024-02-05 NOTE — Progress Notes (Signed)
 Patient is discharging at this time. Patient is A&Ox4. Stable. Patient denies SI,HI, and A/V/H with no plan/intent. Printed AVS reviewed with and given to patient along with medications and follow up appointments. Suicide safety plan and survey complete with copy provided to patient. Original form in chart. Patient verbalized all understanding. All valuables/belongings returned to patient. Patient is being transported by her husband. Patient denies any pain/discomfort. No s/s of current distress.

## 2024-02-05 NOTE — Progress Notes (Signed)
  [facility] Adult Case Management Discharge Plan :  Will you be returning to the same living situation after discharge:  Yes,  pt returning home after discharge.  At discharge, do you have transportation home?: Yes,  pt reported her husband, [name], will be providing transportation at 10:00am. Do you have the ability to pay for your medications: Yes,  pt is insured -Medicare Part A.  Release of information consent forms completed and in the chart;  Patient's signature needed at discharge.  Patient to Follow up at:  Follow-up Information     Services, [facility]. Go on 02/08/2024.   Why: You have a hospital follow up appointment for therapy and medication management services on 02/08/24 at 9:00 am, in person. Contact information: [address] [place] [address] [phone]         [facility] Follow up.   Why: Please contact this provider to personally schedule an appointment for grief/bereavement therapy services. Contact information: [address] [place], [place] [address]  Phone: [phone]                Next level of care provider has access to [facility] Link:no  Safety Planning and Suicide Prevention discussed: Yes,  completed with [name] [phone] (husband).     Has patient been referred to the Quitline?: Patient does not use tobacco/nicotine products  Patient has been referred for addiction treatment: No known substance use disorder.  Arvil Utz M Beyonce Sawatzky, LCSWA 02/05/2024, 8:59 AM

## 2024-02-05 NOTE — Progress Notes (Signed)
 Adult Psychoeducational Group Note  Date:  02/05/2024 Time:  9:41 AM  Group Topic/Focus:  Goals Group:   The focus of this group is to help patients establish daily goals to achieve during treatment and discuss how the patient can incorporate goal setting into their daily lives to aide in recovery.  Participation Level:  Active  Participation Quality:  Appropriate  Affect:  Appropriate  Cognitive:  Appropriate  Insight: Appropriate  Engagement in Group:  Engaged  Modes of Intervention:  Discussion  Additional Comments:  Pt goal for the day is to be discharged.  Daine Pillar D 02/05/2024, 9:41 AM
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
## 2024-02-05 NOTE — [facility] Group Notes (Signed)
 Spiritual care group facilitated by Chaplain Rockie Sofia, B[facility] and Alan Lesches, Mdiv  Group focused on topic of strength. Group members reflected on what thoughts and feelings emerge when they hear this topic. They then engaged in facilitated dialog around how strength is present in their lives. This dialog focused on representing what strength had been to them in their lives (images and patterns given) and what they saw as helpful in their life now (what they needed / wanted).  Activity drew on narrative framework.  Patient Progress: Satia attended group. Though verbal participation was minimal, she demonstrated engagement in the group conversation.

## 2024-02-05 NOTE — Progress Notes (Signed)
 Spiritual care group on grief and loss facilitated by Chaplain Rockie Sofia, Bcc  Group Goal: Support / Education around grief and loss  Members engage in facilitated group support and psycho-social education.  Group Description:  Following introductions and group rules, group members engaged in facilitated group dialogue and support around topic of loss, with particular support around experiences of loss in their lives. Group Identified types of loss (relationships / self / things) and identified patterns, circumstances, and changes that precipitate losses. Reflected on thoughts / feelings around loss, normalized grief responses, and recognized variety in grief experience. Group encouraged individual reflection on safe space and on the coping skills that they are already utilizing.  Group drew on Adlerian / Rogerian and narrative framework  Patient Progress: Kathryn [name] attended group.  Though verbal participation was minimal, she demonstrated engagement in the group conversation. She became tearful at one time and I invited her to meet individually, which she agreed to do tomorrow.

## 2024-02-05 NOTE — [facility] Suicide Risk Assessment (Signed)
 Suicide Risk Assessment  Discharge Assessment    [facility] Discharge Suicide Risk Assessment   Principal Problem: Schizoaffective disorder, depressive type [facility]) Discharge Diagnoses: Principal Problem:   Schizoaffective disorder, depressive type (HCC)   Total Time spent with patient: 15 minutes  During the patient's hospitalization, patient had extensive initial psychiatric evaluation, and follow-up psychiatric evaluations every day.  Psychiatric diagnoses provided upon initial assessment: Schizoaffective disorder, depressive type  Patient's psychiatric medications were adjusted on admission: Removed morning dose of risperidone  2mg . Removed topamax  100mg  at night time. Both had unclear benefits and were concerning for over-sedation.  During the hospitalization, other adjustments were made to the patient's psychiatric medication regimen: Lithium  300mg  daily was added on 9/9 for depression augmentation and to help with low energy in the setting of elevated blood pressure.  Gradually, patient started adjusting to milieu.   Patient's care was discussed during the interdisciplinary team meeting every day during the hospitalization.  The patient denies having side effects to prescribed psychiatric medication.  The patient reports their target psychiatric symptoms of hallucinations and depression responded well to the psychiatric medications, and the patient reports overall benefit other psychiatric hospitalization. Supportive psychotherapy was provided to the patient. The patient also participated in regular group therapy while admitted.   Labs were reviewed with the patient, and abnormal results were discussed with the patient.  The patient denied having suicidal thoughts more than 48 hours prior to discharge.  Patient denies having homicidal thoughts.  Patient says that the hallucinations she is having are significantly improved from her baseline, and she is able to block them out well without  distress.  Patient denies having paranoid thoughts.  The patient is able to verbalize their individual safety plan to this provider.  It is recommended to the patient to continue psychiatric medications as prescribed, after discharge from the hospital.    It is recommended to the patient to follow up with your outpatient psychiatric provider and PCP.  Discussed with the patient, the impact of alcohol, drugs, tobacco have been there overall psychiatric and medical wellbeing, and total abstinence from substance use was recommended the patient.   Musculoskeletal: Strength & Muscle Tone: within normal limits Gait & Station: normal Patient leans: N/A  Psychiatric Specialty Exam  Presentation  General Appearance:  Appropriate for Environment; Casual  Eye Contact: Fair  Speech: Clear and Coherent  Speech Volume: Normal  Handedness: Right   Mood and Affect  Mood: Euthymic  Duration of Depression Symptoms: Greater than two weeks  Affect: Constricted; Appropriate   Thought Process  Thought Processes: Coherent  Descriptions of Associations:Intact  Orientation:Full (Time, Place and Person)  Thought Content:Logical  History of Schizophrenia/Schizoaffective disorder:Yes  Duration of Psychotic Symptoms:Greater than six months  Hallucinations:Hallucinations: Visual; Auditory (reports they are improved from baseline)  Ideas of Reference:None  Suicidal Thoughts:Suicidal Thoughts: No  Homicidal Thoughts:Homicidal Thoughts: No   Sensorium  Memory: Immediate Fair; Recent Fair  Judgment: Fair  Insight: Good   Executive Functions  Concentration: Fair  Attention Span: Fair  Recall: Fair  Fund of Knowledge: Fair  Language: Fair   Psychomotor Activity  Psychomotor Activity:Psychomotor Activity: Normal   Assets  Assets: Communication Skills; Financial Resources/Insurance; Housing; Intimacy; Social Support   Sleep  Sleep:Sleep: Good   Estimated Sleeping Duration (Last 24 Hours): 6.75-7.50 hours  Physical Exam: Physical Exam Vitals reviewed.  Constitutional:      General: She is not in acute distress.    Appearance: She is not toxic-appearing.  Pulmonary:  Effort: Pulmonary effort is normal. No respiratory distress.  Skin:    General: Skin is warm and dry.  Neurological:     Mental Status: She is alert.     Gait: Gait normal.    Review of Systems  Constitutional: Negative.  Negative for fever.  Respiratory: Negative.  Negative for shortness of breath.   Cardiovascular: Negative.   Gastrointestinal: Negative.  Negative for constipation and diarrhea.   Blood pressure (!) 150/86, pulse (!) 104, temperature 98.2 F (36.8 C), temperature source Oral, resp. rate 18, height 5' 2 (1.575 m), weight 70.8 kg, last menstrual period 01/23/2013, SpO2 100%. Body mass index is 28.53 kg/m.  Mental Status Per Nursing Assessment::   On Admission:  Suicidal ideation indicated by patient  Demographic Factors:  NA  Loss Factors: NA  Historical Factors: NA  Risk Reduction Factors:   Living with another person, especially a relative and Positive social support  Continued Clinical Symptoms:  Currently Psychotic  Cognitive Features That Contribute To Risk:  None    Suicide Risk:  Minimal: No identifiable suicidal ideation.  Patients presenting with no risk factors but with morbid ruminations; may be classified as minimal risk based on the severity of the depressive symptoms   Follow-up Information     Services, [facility]. Go on 02/08/2024.   Why: You have a hospital follow up appointment for therapy and medication management services on 02/08/24 at 9:00 am, in person. Contact information: [address] [place] [address] [phone]         [facility] Follow up.   Why: Please contact this provider to personally schedule an appointment for grief/bereavement therapy  services. Contact information: [address] [place], [place] [address]  Phone: [phone]                Plan Of Care/Follow-up recommendations:   Activity: as tolerated  Diet: heart healthy  Other: -Follow-up with your outpatient psychiatric provider -instructions on appointment date, time, and address (location) are provided to you in discharge paperwork.  -Take your psychiatric medications as prescribed at discharge - instructions are provided to you in the discharge paperwork  -Follow-up with outpatient primary care doctor and other specialists -for management of chronic medical disease  -Testing: Follow-up with outpatient provider for abnormal lab results: A1c 8.5  -Recommend abstinence from alcohol, tobacco, and other illicit drug use at discharge.   -If your psychiatric symptoms recur, worsen, or if you have side effects to your psychiatric medications, call your outpatient psychiatric provider, 911, [phone] or go to the nearest emergency department.  -If suicidal thoughts recur, call your outpatient psychiatric provider, 911, [phone] or go to the nearest emergency department.   Penne Mori, DO 02/05/2024, 8:11 AM

## 2024-02-05 NOTE — Progress Notes (Signed)
(  Sleep Hours) -8.0 (Any PRNs that were needed, meds refused, or side effects to meds)- prn trazodone  @ 2105 (Any disturbances and when (visitation, over night)-none (Concerns raised by the patient)- none (SI/HI/AVH)- denies all

## 2024-02-10 ENCOUNTER — Other Ambulatory Visit: Payer: Self-pay

## 2024-02-12 ENCOUNTER — Telehealth: Payer: Self-pay

## 2024-02-12 ENCOUNTER — Other Ambulatory Visit: Payer: Self-pay

## 2024-02-12 DIAGNOSIS — E1165 Type 2 diabetes mellitus with hyperglycemia: Secondary | ICD-10-CM

## 2024-02-12 NOTE — Telephone Encounter (Signed)
 Copied from CRM [id number]. Topic: General - Other >> Feb 12, 2024 10:41 AM Rosaria BRAVO wrote: Reason for CRM: Pt called to report that she will return to work Sunday night and that the boxes weigh 100 pounds. Needs a letter for work accomodation.   Best contact: [phone]

## 2024-02-12 NOTE — Progress Notes (Signed)
 02/12/2024 Name: Kathryn [name] MRN: [id number] DOB: 20-Jan-1971  Chief Complaint  Patient presents with   Diabetes    Kathryn [name] is a 53 y.o. year old female who presented for a telephone visit.   They were referred to the pharmacist by their PCP for assistance in managing diabetes. PMH includes HTN, asthma, IBS, dysphagia, gastroparesis with abnormal esophagram, T2DM with neuropathy, PTSD, schizoaffective disorder, BMI > 30.    Subjective: Patient was last seen by PCP, Lorice Shall, NP on 10/23/23. She reported taking glipizide  10 mg BID. She was not taking Jardiance  due to cost, and report she had not taken Tresiba  or Trulicity  in months. She reported trying to follow a low-carb diet and avoid soda, but still drinks sweet tea. She was instructed to restart Tresiba  80 units daily and Trulicity .  At pharmacy telephone appointment on 11/06/23, patient reported that she picked up Tresiba  from the pharmacy but it was > $200. She was not able to pick up Trulicity . She reported that she does not currently have insurance because open-enrollment at her job does not open until the fall. She has applied to Medicaid in the past, but reports it was > 2 years ago. She continued to report s/sx of hyperglycemia but did not have a way to monitor her BG. She was instructed to split her dose of Tresiba  for better absorption, continue glipizide , and start Trulicity  (via [facility] supply). She was also instructed to start monitoring her BG and was provided a prescription for the True Metrix meter.   Unfortunately, she was admitted from 11/23/23 to 11/27/23 for recurrent hypoglycemia (BG 49-63 mg/dL despite treatment with OJ). At discharge she was instructed to discontinue glipizide  and Trulicity , and take Basaglar  15 units daily and Novolog  sliding scale 3 times daily before meals. Her BP was well controlled inpatient on ARB monotherapy, so she was also instructed to stop amlodipine  and carvedilol , and continue  losartan  25 mg daily. During her TOC follow-up call, it was identified that she has been dismissed from her psychiatry provider. Her GAD-7 was 21 and patient reported being out of her benztropine  and had 3 days remaining of clonazepam .   Patient was engaged by pharmacy via telephone on 12/04/23. She reported taking her Basaglar  and Novolog  as prescribed and regularly checking her BG. She discontinued amlodipine  and carvedilol  as instructed, but was still taking losartan  100 mg daily instead of 25 mg daily. She was instructed to continue her current regimen and monitor for hypoglycemia. She saw her PCP on 12/11/23.BP was 139/76 mmHg. She reported FBG 79-150 mg/dL and PPG 859-799 mg/dL. She was instructed to increase her Basaglar  to 17 units twice daily. Clarified with PCP that she meant to increase to 17 units once daily.  At pharmacy follow-up on 01/08/24, patient reported taking Basaglar  17 units BID but sometimes forgetting her AM dose. She was overall stable, and no medication changes were made. She was seen by PCP on 01/29/24 for suicidal ideation and was subsequently admitted for schizophrenia. She also reported FBG in the 300s and her Basaglar  was increased to 19 units BID. A1C increased from 8.0% to 8.5%.  Her psychiatric medications were adjusted and she was instructed to stop topiramate  and clonazepam  and start litihium ER 300 mg daily for improved energy. They also adjusted her risperidone  to 6 mg at bedtime (rather than BID).  Today, patient reports doing well. She has been in better spirits since hospital discharge. She has been monitoring her sugars and taking  insulin  as prescribed. Denies missed doses of Basaglar . Reports that sometimes her prandial sugars are on the lower side and she does not ned Humalog .    Care Team: Primary Care Provider: Paseda, Folashade R, FNP ; Next Scheduled Visit: 03/02/24 Behavioral Health: established with new provider 02/08/24 after hospital discharge   Medication  Access/Adherence  Current Pharmacy:  [facility] [address] [place] [address] Phone: [phone] Fax: [phone]   Patient reports affordability concerns with their medications: Yes - has no insurance until open enrollment with her job until October.  Patient reports access/transportation concerns to their pharmacy: No  Patient reports adherence concerns with their medications:  Yes  - patient has a history of stopping medications without reaching out to troubleshoot cost/adherence issues.    Diabetes:  Current medications: Basaglar  19 units twice daily in the morning and evening (denies missed doses), Novolog  before each meal 3 times a day per sliding scale (140-199 - 2 units, 200-250 - 4 units, 251-299 - 6 units, 300-349 - 8 units, 350 or above 10 units) (reports that she often does not need to take this with meals due to her BG being controlled)  Medications tried in the past: metformin - intolerance, restarted Trulicity  0.75 mg on 11/09/23 then d/c at last hospitalization- has at home still.   Current glucose readings:  Using True Metrix meter - checking 3 times daily - Usually works 11PM - 7AM (sleeps during the day), but this past week she was at home and sleeping overnight.  02/11/24: 8AM 191, 12PM 221, 9PM 117  02/10/24: 3PM 141, 10PM 155 02/09/24: 10AM 109, 6PM 134, 9PM 79 > had a little orange juice,  10PM 132 02/08/24: 8AM 177, 1PM 109, 7PM 126 02/07/24: 7AM 191, 11AM 216 (4 units mealtime insulin ), 5PM 136 02/06/24: 8AM 119, 3PM 252 (took 4 units mealtime insulin ) 02/05/24: 9AM 307, 12PM 141, 9PM 119 (day of discharge)   Previously used Dexcom G7 when she had insurance  Patient reports hypoglycemic s/sx including dizziness, shakiness, sweatiness when her BG dropped to 79 mg/dL. Patient denies hyperglycemic symptoms including nocturia, polyuria, polydipsia, polyphagia, nocturia, blurred vision,  neuropathy.  Current meal patterns: Reports she is following a carb-modified diet at home. She is not skipping meals. Reports she is staying away from bread and pasta.  - Drinks: reports she has been drinking mostly water  during the day, she has had black tea with a little bit of honey, green juice. No sodas or sweet tea.  Current physical activity: walking at work  Current medication access support: Enrolled in [place]. Patient is willing to reapply for Medicaid.  Hypertension:  Current medications: losartan  100 mg daily (reports she still has this at home), amlodipine  5 mg daily Medications previously tried: lisinopril , amlodipine  10 mg and carvedilol  12.5 mg BID were recently discontinued during 11/23/23 hospitalization - she put remaining supply to the side at home.   Home BP - there is a BP  cuff at her daughters house - when she checked over the past week reports it did not give her a reading just read low  Hyperlipidemia/ASCVD Risk Reduction  Current lipid lowering medications: atorvastatin  10 mg daily  Medications tried in the past: simvastatin  Antiplatelet regimen: aspirin  81 mg daily   ASCVD History: none. Normal echo (2018) and stress test (2022) Family History: Heart disease and aneurysm (brain) in Mother Risk Factors: T2DM, HTN  Clinical ASCVD: No  The ASCVD Risk score (  Arnett DK, et al., 2019) failed to calculate for the following reasons:   The valid total cholesterol range is 130 to 320 mg/dL    Objective:  BP Readings from Last 3 Encounters:  02/05/24 (!) 150/86  01/30/24 108/61  01/29/24 119/75   Lab Results  Component Value Date   HGBA1C 8.5 (H) 01/29/2024   HGBA1C 8.0 (H) 11/24/2023   HGBA1C 10.7 (A) 10/23/2023    Lab Results  Component Value Date   CREATININE 1.05 (H) 01/29/2024   BUN 22 (H) 01/29/2024   NA 138 01/29/2024   K 3.8 01/29/2024   CL 104 01/29/2024   CO2 23 01/29/2024    Lab Results  Component Value Date   CHOL 111 01/29/2024    HDL 57 01/29/2024   LDLCALC 39 01/29/2024   TRIG 76 01/29/2024   CHOLHDL 1.9 01/29/2024    Medications Reviewed Today     Reviewed by Brinda Lorain SQUIBB, RPH (Pharmacist) on 02/12/24 at 0924  Med List Status: <None>   Medication Order Taking? Sig Documenting Provider Last Dose Status Informant  albuterol  (VENTOLIN  HFA) 108 (90 Base) MCG/ACT inhaler [id number]  Inhale 2 puffs into the lungs every 6 (six) hours as needed for wheezing or shortness of breath. Zarwolo, Gloria, FNP  Active Multiple Informants           Med Note DONETTE, TOBIAS DEL   Fri Jan 29, 2024  2:30 PM) Last dose was last month  amLODipine  (NORVASC ) 5 MG tablet [id number] Yes Take 1 tablet (5 mg total) by mouth daily. Lera Nancyann NOVAK, DO  Active   aspirin  EC 81 MG tablet [id number]  Take 81 mg by mouth daily. [provider]  Active Multiple Informants  atorvastatin  (LIPITOR) 10 MG tablet [id number] Yes Take 1 tablet (10 mg total) by mouth daily. Paseda, Folashade R, FNP  Active Multiple Informants  benztropine  (COGENTIN ) 1 MG tablet [id number] Yes Take 1 tablet (1 mg total) by mouth at bedtime. Paseda, Folashade R, FNP  Active Multiple Informants  Blood Glucose Monitoring Suppl (TRUE METRIX METER) w/Device KIT [id number]  Use as directed to monitor blood sugar. Paseda, Folashade R, FNP  Active Multiple Informants  Cholecalciferol (VITAMIN D -3) 125 MCG (5000 UT) TABS [id number]  Take 2 tablets by mouth daily.  Patient taking differently: Take 10,000 Units by mouth daily.   [name], Gebreselassie W, MD  Active Multiple Informants  DULoxetine  (CYMBALTA ) 60 MG capsule [id number] Yes Take 1 capsule (60 mg total) by mouth 2 (two) times daily. Paseda, Folashade R, FNP  Active Multiple Informants  ferrous sulfate  325 (65 FE) MG tablet [id number]  Take 325 mg by mouth daily with breakfast. [provider]  Active Multiple Informants  gabapentin  (NEURONTIN ) 300 MG capsule [id number] Yes Take 1 capsule (300 mg total) by mouth daily.  Paseda, Folashade R, FNP  Active Multiple Informants  Glucagon  (GVOKE HYPOPEN  2-PACK) 1 MG/0.2ML SOAJ [id number]  Inject 1 mg into the skin daily as needed. Paseda, Folashade R, FNP  Active Multiple Informants  glucose blood (TRUE METRIX BLOOD GLUCOSE TEST) test strip [id number]  Use as instructed to monitor blood sugar twice daily Paseda, Folashade R, FNP  Active Multiple Informants  Insulin  Glargine (BASAGLAR  KWIKPEN) 100 UNIT/ML [id number] Yes Inject 19 Units into the skin 2 (two) times daily. May increase to a maximum total daily dose of 40 units. Paseda, Folashade R, FNP  Active Multiple Informants           Med Note (ROWE, CARLA H  Fri Jan 29, 2024  2:32 PM) This medication was increased today to 19 units. Her last dose was 17 units this morning  insulin  lispro (HUMALOG ) 100 UNIT/ML KwikPen [id number] Yes Take before each meal 3 times a day, 140-199 - 2 units, 200-250 - 4 units, 251-299 - 6 units, 300-349 - 8 units, 350 or above 10 units Paseda, Folashade R, FNP  Active Multiple Informants  Insulin  Pen Needle (PEN NEEDLES) 31G X 6 MM MISC [id number]  For 4 times a day insulin , 1 month supply. Diagnosis E11.65 Paseda, Folashade R, FNP  Active Multiple Informants  levothyroxine  (SYNTHROID ) 75 MCG tablet [id number] Yes Take 1 tablet (75 mcg total) by mouth daily before breakfast. Paseda, Folashade R, FNP  Active Multiple Informants  lithium  carbonate (LITHOBID ) 300 MG ER tablet [id number] Yes Take 1 tablet (300 mg total) by mouth daily. Lera Nancyann NOVAK, DO  Active   losartan  (COZAAR ) 100 MG tablet [id number] Yes Take 1 tablet (100 mg total) by mouth daily. Paseda, Folashade R, FNP  Active Multiple Informants  Multiple Vitamin (MULTIVITAMIN) tablet [id number]  Take 1 tablet by mouth daily. [provider]  Active Multiple Informants  risperiDONE  (RISPERDAL ) 3 MG tablet [id number] Yes Take 2 tablets (6 mg total) by mouth at bedtime. Lera Nancyann NOVAK, DO  Active   rizatriptan  (MAXALT ) 10 MG tablet  [id number]  Take 1 tablet (10 mg total) by mouth as needed for migraine. May repeat in 2 hours if needed Paseda, Folashade R, FNP  Active Multiple Informants           Med Note DONETTE, TOBIAS DEL   Fri Jan 29, 2024  2:34 PM) Last dose was 2 months ago  traZODone  (DESYREL ) 50 MG tablet [id number]  Take 1 tablet (50 mg total) by mouth at bedtime as needed for sleep. Lera Nancyann NOVAK, DO  Active   TRUEplus Lancets 30G MISC [id number]  Use as directed to monitor blood sugar twice daily Paseda, Folashade R, FNP  Active Multiple Informants              Assessment/Plan:   Diabetes: - Currently uncontrolled with last A1C of 8.5% above goal < 7%, and increased from 8.0% two months prior. Patient reports good adherence to insulin  and is checking her BG regularly. Post-prandial sugars are well controlled, with one recent reading in the 70s. However, FBG continued to be elevated. Suspect risperidone  is likely contributing to elevated fasting sugars. I do not recommend continuing to titrate basal insulin , given hospitalization earlier this year for hypoglycemia. I would prefer that patient restart GLP-1RA Trulicity , which could potentially replace prandial insulin  as she is needing and using very little Humalog . Patient not interested in making this change today - will discuss with PCP.  - Reviewed long term cardiovascular and renal outcomes of uncontrolled blood sugar - Reviewed goal A1c, goal fasting, and goal 2 hour post prandial glucose - Reviewed dietary modifications including reducing portion size of carbohydrate foods, increasing protein, increasing intake of non-starchy vegetables. - Reviewed lifestyle modifications including: increasing physical activity - Recommend to contniue Basaglar  19 units twice daily. Patient aware to notify clinic if she is having hypoglycemia with low BG < 70 mg/dL - Recommend to continue Humalog  3 times daily before meals per sliding scale (140-199 - 2 units, 200-250 - 4 units,  251-299 - 6 units, 300-349 - 8 units, 350 or above 10 units) - Patient denies personal or family history of multiple endocrine neoplasia type 2, medullary thyroid  cancer; personal history  of pancreatitis or gallbladder disease. - Recommend to check glucose TID before meals (True Metrix meter and supplies for affordability) - Enrolled in [employer] program at [facility] through June 2026.     Hypertension: - Currently unable to assess control. Amlodipine  5 mg daily was added at hospital discharge and patient does not have any recent home readings. Encouraged her to monitor at home - Reviewed long term cardiovascular and renal outcomes of uncontrolled blood pressure - Reviewed appropriate blood pressure monitoring technique and reviewed goal blood pressure. Recommended to check home blood pressure and heart rate once daily - Recommend to continue losartan  100 mg daily, amlodipine  5 mg daily    Hyperlipidemia/ASCVD Risk Reduction: - Currently controlled with LDL-C of 39 mg/dL below goal < 70 mg/dL on atorvastatin  10 mg daily. Recommend to continue moderate intensity statin.  - Reviewed long term complications of uncontrolled cholesterol - Recommend to continue atorvastatin  10 mg daily.      Follow Up Plan: PCP 03/02/24, Pharmacist telephone 04/01/24   Lorain Baseman, PharmD [facility] [phone]

## 2024-02-16 ENCOUNTER — Telehealth: Payer: Self-pay

## 2024-02-16 NOTE — Telephone Encounter (Unsigned)
 Copied from CRM [id number]. Topic: General - Other >> Feb 16, 2024  2:32 PM Carlatta H wrote: Reason for CRM: Patient would like to her employer stating lifting restrictions//Patient would like to pick up letter tomorrow/

## 2024-02-17 ENCOUNTER — Ambulatory Visit (INDEPENDENT_AMBULATORY_CARE_PROVIDER_SITE_OTHER): Payer: Self-pay | Admitting: Nurse Practitioner

## 2024-02-17 ENCOUNTER — Other Ambulatory Visit: Payer: Self-pay

## 2024-02-17 ENCOUNTER — Encounter: Payer: Self-pay | Admitting: Nurse Practitioner

## 2024-02-17 ENCOUNTER — Other Ambulatory Visit (HOSPITAL_COMMUNITY): Payer: Self-pay

## 2024-02-17 VITALS — BP 128/70 | HR 85 | Temp 96.8°F | Wt 161.0 lb

## 2024-02-17 DIAGNOSIS — Z7689 Persons encountering health services in other specified circumstances: Secondary | ICD-10-CM | POA: Insufficient documentation

## 2024-02-17 DIAGNOSIS — F419 Anxiety disorder, unspecified: Secondary | ICD-10-CM

## 2024-02-17 DIAGNOSIS — Z794 Long term (current) use of insulin: Secondary | ICD-10-CM

## 2024-02-17 DIAGNOSIS — E1165 Type 2 diabetes mellitus with hyperglycemia: Secondary | ICD-10-CM

## 2024-02-17 DIAGNOSIS — I10 Essential (primary) hypertension: Secondary | ICD-10-CM

## 2024-02-17 DIAGNOSIS — F251 Schizoaffective disorder, depressive type: Secondary | ICD-10-CM

## 2024-02-17 DIAGNOSIS — Z5689 Other problems related to employment: Secondary | ICD-10-CM | POA: Insufficient documentation

## 2024-02-17 MED ORDER — BENZTROPINE MESYLATE 1 MG PO TABS
1.0000 mg | ORAL_TABLET | Freq: Every evening | ORAL | 2 refills | Status: DC
  Filled 2024-04-12: qty 30, 30d supply, fill #0

## 2024-02-17 MED ORDER — LITHIUM CARBONATE 300 MG PO CAPS
300.0000 mg | ORAL_CAPSULE | Freq: Every day | ORAL | 2 refills | Status: AC
  Filled 2024-02-17 – 2024-04-01 (×2): qty 30, 30d supply, fill #0
  Filled 2024-04-26 (×2): qty 30, 30d supply, fill #1
  Filled 2024-05-31: qty 30, 30d supply, fill #2

## 2024-02-17 MED ORDER — DULOXETINE HCL 60 MG PO CPEP
60.0000 mg | ORAL_CAPSULE | Freq: Two times a day (BID) | ORAL | 2 refills | Status: DC
  Filled 2024-02-17 – 2024-04-01 (×2): qty 60, 30d supply, fill #0

## 2024-02-17 MED ORDER — TRAZODONE HCL 50 MG PO TABS
50.0000 mg | ORAL_TABLET | Freq: Every day | ORAL | 2 refills | Status: DC
  Filled 2024-03-09: qty 30, 30d supply, fill #0

## 2024-02-17 MED ORDER — BASAGLAR KWIKPEN 100 UNIT/ML ~~LOC~~ SOPN
21.0000 [IU] | PEN_INJECTOR | Freq: Two times a day (BID) | SUBCUTANEOUS | 4 refills | Status: DC
Start: 2024-02-17 — End: 2024-02-17
  Filled 2024-02-17: qty 12, 29d supply, fill #0

## 2024-02-17 MED ORDER — RISPERIDONE 3 MG PO TABS
6.0000 mg | ORAL_TABLET | Freq: Every evening | ORAL | 2 refills | Status: AC
  Filled 2024-03-09 – 2024-04-12 (×2): qty 60, 30d supply, fill #0

## 2024-02-17 MED ORDER — BASAGLAR KWIKPEN 100 UNIT/ML ~~LOC~~ SOPN
21.0000 [IU] | PEN_INJECTOR | Freq: Two times a day (BID) | SUBCUTANEOUS | 4 refills | Status: DC
  Filled 2024-02-17 – 2024-03-09 (×2): qty 12, 29d supply, fill #0
  Filled 2024-04-12: qty 12, 29d supply, fill #1
  Filled 2024-05-11: qty 12, 29d supply, fill #2

## 2024-02-17 NOTE — Patient Instructions (Addendum)
 1. Uncontrolled type 2 diabetes mellitus with hyperglycemia, with long-term current use of insulin  (HCC)  - Microalbumin/Creatinine Ratio, Urine - Insulin  Glargine (BASAGLAR  KWIKPEN) 100 UNIT/ML; Inject 21 Units into the skin 2 (two) times daily.  Dispense: 12 mL; Refill: 4    Goal for fasting blood sugar ranges from 80 to 120 and 2 hours after any meal or at bedtime should be between 130 to 170.   It is important that you exercise regularly at least 30 minutes 5 times a week as tolerated  Think about what you will eat, plan ahead. Choose  clean, green, fresh or frozen over canned, processed or packaged foods which are more sugary, salty and fatty. 70 to 75% of food eaten should be vegetables and fruit. Three meals at set times with snacks allowed between meals, but they must be fruit or vegetables. Aim to eat over a 12 hour period , example 7 am to 7 pm, and STOP after  your last meal of the day. Drink water ,generally about 64 ounces per day, no other drink is as healthy. Fruit juice is best enjoyed in a healthy way, by EATING the fruit.  Thanks for choosing [facility] we consider it a privelige to serve you.

## 2024-02-17 NOTE — Assessment & Plan Note (Signed)
  Cleared to return to work without restrictions. Focus on health, particularly diabetes and mental health. Potential long-term disability application in progress. - Write letter to employer indicating no lifting restrictions. - Encourage focus on health management, particularly diabetes and mental health.

## 2024-02-17 NOTE — Assessment & Plan Note (Addendum)
 Managed with Cymbalta , Risperidone , Trazodone , and Lithium . Recent changes include Lithium  addition and Xanax discontinuation. . Coordination with [facility] ongoing. - Attend [facility] appointment for psychiatric management. - Continue current psychiatric medication regimen: Cymbalta  60 mg twice daily, Risperidone  6 mg at bedtime, Trazodone  50 mg as needed at bedtime, Lithium  300 mg daily.

## 2024-02-17 NOTE — Assessment & Plan Note (Signed)
 Lab Results  Component Value Date   HGBA1C 8.5 (H) 01/29/2024   Type 2 diabetes mellitus with hyperglycemia No hypoglycemia. - Increase Basaglar  to 21 units twice daily. - Continue short-acting insulin  per sliding scale. - Advise to avoid sugar and sweets. - Encourage adherence to dietary plan by [facility]. CBG goals provided encouraged to report hypoglycemia, appreciates collaboration with the clinical pharmacist

## 2024-02-17 NOTE — Telephone Encounter (Signed)
 Pt seen today. Kh

## 2024-02-17 NOTE — Assessment & Plan Note (Signed)
 Managed with Cymbalta  60 mg twice daily Follow-up with psychiatrist at [facility] as planned

## 2024-02-17 NOTE — Progress Notes (Signed)
 Acute Office Visit  Subjective:     Patient ID: Kathryn [name], female    DOB: 1970-11-06, 53 y.o.   MRN: [id number]  Chief Complaint  Patient presents with   Consult    HPI Patient is in today for   Discussed the use of AI scribe software for clinical note transcription with the patient, who gave verbal consent to proceed.  History of Present Illness Kathryn [name] is a 53 year old female who presents for a work clearance note  She is seeking clearance to return to work without lifting restrictions after a period of short-term disability following a hospital admission. 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  She manages her diabetes with Basaglar , taking 19 units twice daily, and uses a sliding scale for humalog  . Her blood sugar was 79 this morning, with no episodes of hypoglycemia below 70. She adheres to a diet plan, avoiding sugar and sweets, as advised by [facility].  She was on admission from 01/30/2024 to 02/05/2024 for suicidal ideation her mental health is managed with Cymbalta  60 mg twice daily, risperidone  60 mg at bedtime, trazodone  50 mg as needed at bedtime, and lithium  300 mg daily. Recent medication changes a discontinuation of Xanax and Topamax . She has an upcoming appointment at [facility] for psychiatric management today   She is working on obtaining long-term disability to focus on her health, particularly diabetes and mental health.   Assessment & Plan     Review of Systems  Constitutional:  Negative for appetite change, chills, fatigue and fever.  HENT:  Negative for congestion.   Respiratory:  Negative for cough, shortness of breath and wheezing.   Cardiovascular:  Negative for chest pain, palpitations and leg swelling.  Gastrointestinal:  Negative for abdominal pain, constipation, nausea and vomiting.  Genitourinary:  Negative for difficulty urinating, dysuria, flank pain and frequency.  Musculoskeletal:  Negative  for arthralgias, joint swelling and myalgias.  Skin:  Negative for color change, pallor, rash and wound.  Neurological:  Negative for seizures, weakness, light-headedness and numbness.  Psychiatric/Behavioral:  Negative for behavioral problems, confusion, self-injury and suicidal ideas.         Objective:    BP 128/70   Pulse 85   Temp (!) 96.8 F (36 C)   Wt 161 lb (73 kg)   LMP 01/23/2013   SpO2 (!) 85%   BMI 29.45 kg/m    Physical Exam Vitals and nursing note reviewed.  Constitutional:      General: She is not in acute distress.    Appearance: Normal appearance. She is not ill-appearing, toxic-appearing or diaphoretic.  Cardiovascular:     Rate and Rhythm: Normal rate and regular rhythm.     Pulses: Normal pulses.     Heart sounds: Normal heart sounds. No murmur heard.    No friction rub. No gallop.  Pulmonary:     Effort: Pulmonary effort is normal. No respiratory distress.     Breath sounds: Normal breath sounds. No stridor. No wheezing, rhonchi or rales.  Chest:     Chest wall: No tenderness.  Abdominal:     General: There is no distension.     Palpations: Abdomen is soft.     Tenderness: There is no abdominal tenderness. There is no right CVA tenderness, left CVA tenderness or guarding.  Musculoskeletal:        General: No swelling, tenderness, deformity or signs of injury.  Right lower leg: No edema.     Left lower leg: No edema.  Skin:    General: Skin is warm and dry.     Capillary Refill: Capillary refill takes less than 2 seconds.     Coloration: Skin is not jaundiced or pale.     Findings: No bruising, erythema or lesion.  Neurological:     Mental Status: She is alert and oriented to person, place, and time.     Motor: No weakness.     Coordination: Coordination normal.     Gait: Gait normal.  Psychiatric:        Mood and Affect: Mood normal.        Behavior: Behavior normal.        Thought Content: Thought content normal.        Judgment:  Judgment normal.     No results found for any visits on 02/17/24.      Assessment & Plan:   Problem List Items Addressed This Visit       Cardiovascular and Mediastinum   Essential hypertension, benign   BP Readings from Last 3 Encounters:  02/17/24 128/70  02/05/24 (!) 150/86  01/30/24 108/61   Blood pressure well-controlled at 128/70 mmHg.  On amlodipine  5 mg daily, losartan  100 mg daily        Endocrine   Uncontrolled type 2 diabetes mellitus with hyperglycemia, with long-term current use of insulin  (HCC)   Lab Results  Component Value Date   HGBA1C 8.5 (H) 01/29/2024   Type 2 diabetes mellitus with hyperglycemia No hypoglycemia. - Increase Basaglar  to 21 units twice daily. - Continue short-acting insulin  per sliding scale. - Advise to avoid sugar and sweets. - Encourage adherence to dietary plan by [facility]. CBG goals provided encouraged to report hypoglycemia, appreciates collaboration with the clinical pharmacist       Relevant Medications   Insulin  Glargine (BASAGLAR  KWIKPEN) 100 UNIT/ML   Other Relevant Orders   Microalbumin/Creatinine Ratio, Urine     Other   Schizoaffective disorder, depressive type (HCC)   Managed with Cymbalta , Risperidone , Trazodone , and Lithium . Recent changes include Lithium  addition and Xanax discontinuation. . Coordination with [facility] ongoing. - Attend [facility] appointment for psychiatric management. - Continue current psychiatric medication regimen: Cymbalta  60 mg twice daily, Risperidone  6 mg at bedtime, Trazodone  50 mg as needed at bedtime, Lithium  300 mg daily.           Anxiety - Primary   Managed with Cymbalta  60 mg twice daily Follow-up with psychiatrist at [facility] as planned      Return to work evaluation    Cleared to return to work without restrictions. Focus on health, particularly diabetes and mental health. Potential long-term disability application in progress. - Write letter to employer indicating no  lifting restrictions. - Encourage focus on health management, particularly diabetes and mental health.       Meds ordered this encounter  Medications   DISCONTD: Insulin  Glargine (BASAGLAR  KWIKPEN) 100 UNIT/ML    Sig: Inject 21 Units into the skin 2 (two) times daily. May increase to a maximum total daily dose of 40 units.    Dispense:  12 mL    Refill:  4    Can substitute to any approved long-acting insulin , 1 month supply, dispense as needed syringes and needles.   Insulin  Glargine (BASAGLAR  KWIKPEN) 100 UNIT/ML    Sig: Inject 21 Units into the skin 2 (two) times daily.    Dispense:  12 mL  Refill:  4    Can substitute to any approved long-acting insulin , 1 month supply, dispense as needed syringes and needles.    Return in about 3 months (around 05/18/2024) for DM.  Liela Rylee R Dell Hurtubise, FNP

## 2024-02-17 NOTE — Assessment & Plan Note (Signed)
 BP Readings from Last 3 Encounters:  02/17/24 128/70  02/05/24 (!) 150/86  01/30/24 108/61   Blood pressure well-controlled at 128/70 mmHg.  On amlodipine  5 mg daily, losartan  100 mg daily

## 2024-02-18 ENCOUNTER — Other Ambulatory Visit: Payer: Self-pay

## 2024-02-18 LAB — MICROALBUMIN / CREATININE URINE RATIO
Creatinine, Urine: 71.9 mg/dL
Microalb/Creat Ratio: 5 mg/g{creat} (ref 0–29)
Microalbumin, Urine: 3.3 ug/mL

## 2024-02-18 NOTE — Progress Notes (Signed)
 Consulted with patient in room after her PCP visit yesterday 02/17/24. Patient was advised to continue closely monitoring her blood sugars, and was reassured that her PCP and I work closely together to make medication changes. Will follow-up with her as scheduled in ~ 1 month.   Lorain Baseman, PharmD [facility] [phone]

## 2024-02-18 NOTE — Telephone Encounter (Signed)
 Done [facility]

## 2024-02-19 ENCOUNTER — Other Ambulatory Visit: Payer: Self-pay

## 2024-02-19 ENCOUNTER — Ambulatory Visit: Payer: Self-pay | Admitting: Nurse Practitioner

## 2024-02-24 ENCOUNTER — Other Ambulatory Visit: Payer: Self-pay

## 2024-02-24 ENCOUNTER — Other Ambulatory Visit (HOSPITAL_COMMUNITY): Payer: Self-pay

## 2024-02-25 ENCOUNTER — Other Ambulatory Visit: Payer: Self-pay

## 2024-02-26 ENCOUNTER — Other Ambulatory Visit: Payer: Self-pay

## 2024-02-28 IMAGING — MR MR HEAD WO/W CM
13 series · 48 of 48 positions shown · IV contrast (multihance)
Comparison: Head CT September 05, 2005. MRI of the brain January 11, 2013.

CLINICAL DATA: Left facial numbness ETY.Y (HTZ-1Q-CM). Headache,
new or worsening, neuro deficit. Worsening headaches
(HTZ-1Q-CM).

EXAM:
MRI HEAD WITHOUT AND WITH CONTRAST
TECHNIQUE: Multiplanar, multiecho pulse sequences of the brain and surrounding
structures were obtained without and with intravenous contrast.
CONTRAST:  15mL MULTIHANCE GADOBENATE DIMEGLUMINE 529 MG/ML IV SOLN

[Series 2: T1 · sagittal · 5.0mm · 0.45mm/px · 1 of 21 slices shown]
[im 1/21]
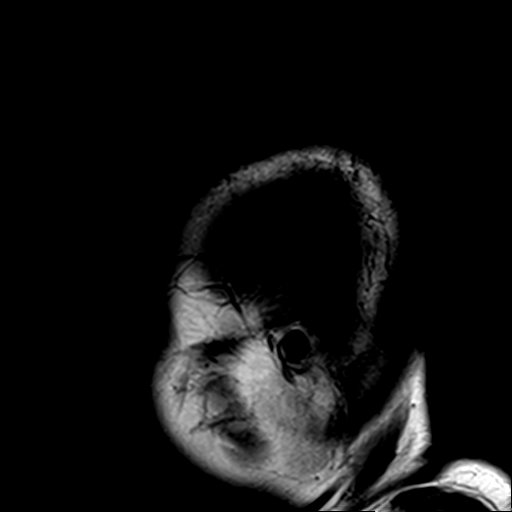

[Series 3: DWI · axial · 3.0mm · 1.80mm/px · z∈[-91,+47]mm · 6 of 95 slices shown (1 of 4)]
[im 1/95]
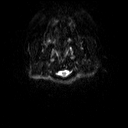
[im 19/95]
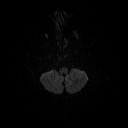
[im 38/95]
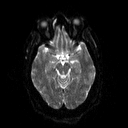
[im 57/95]
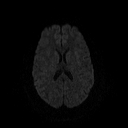
[im 76/95]
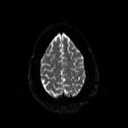
[im 95/95]
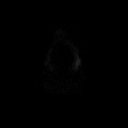

[Series 4: DWI · axial · 3.0mm · 1.80mm/px · z∈[-91,+47]mm · 3 of 48 slices shown (2 of 4)]
[im 1/48]
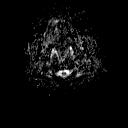
[im 24/48]
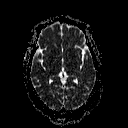
[im 48/48]
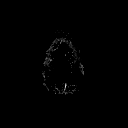

[Series 5: DWI · coronal · 5.0mm · 1.80mm/px · 5 of 68 slices shown (3 of 4)]
[im 1/68]
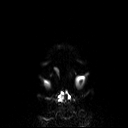
[im 17/68]
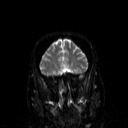
[im 34/68]
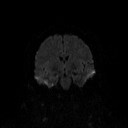
[im 51/68]
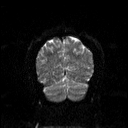
[im 68/68]
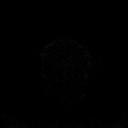

[Series 6: DWI · coronal · 5.0mm · 1.80mm/px · 2 of 34 slices shown (4 of 4)]
[im 1/34]
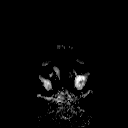
[im 34/34]
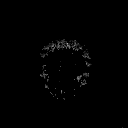

[Series 7: T2 · axial · 5.0mm · 0.60mm/px · 1 of 22 slices shown (1 of 2)]
[im 1/22]
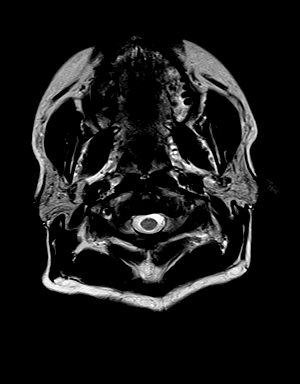

[Series 8: FLAIR · axial · 3.0mm · 0.45mm/px · z∈[-88,+44]mm · 2 of 30 slices shown]
[im 1/30]
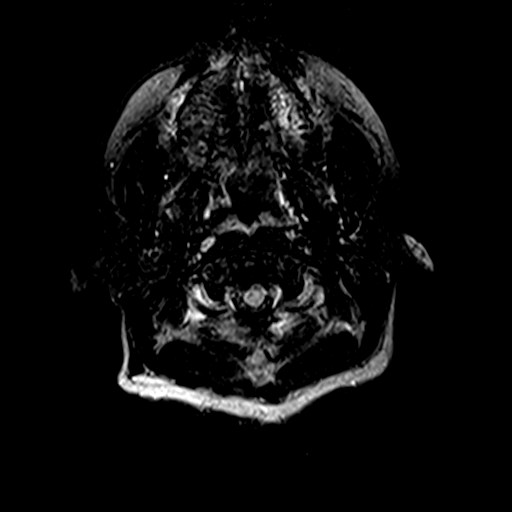
[im 30/30]
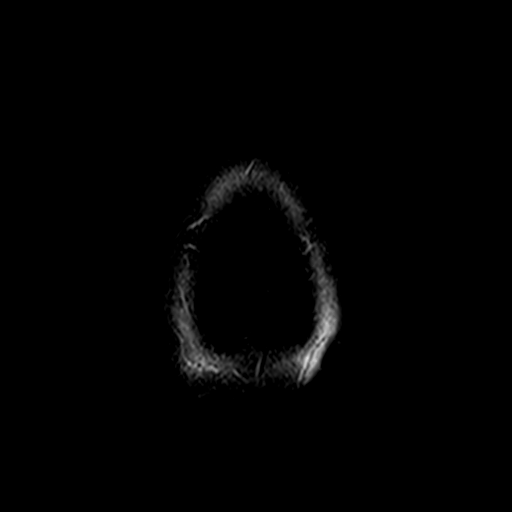

[Series 9: mip_images(sw) · axial · 32.0mm · 0.90mm/px · z∈[-77,+33]mm · 2 of 29 slices shown]
[im 1/29]
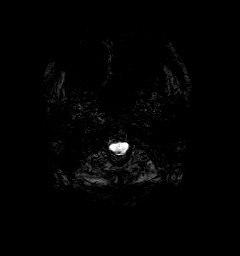
[im 29/29]
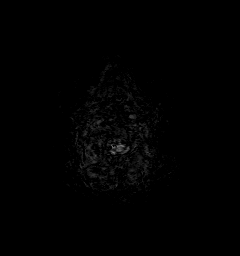

[Series 10: swi_images · axial · 4.0mm · 0.90mm/px · z∈[-91,+47]mm · 2 of 36 slices shown]
[im 1/36]
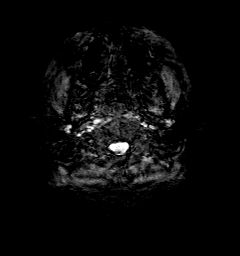
[im 36/36]
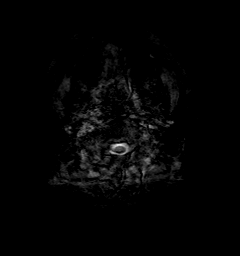

[Series 11: t1_mpr_tra · axial · 1.0mm · 0.75mm/px · z∈[-92,+49]mm · 10 of 144 slices shown]
[im 1/144]
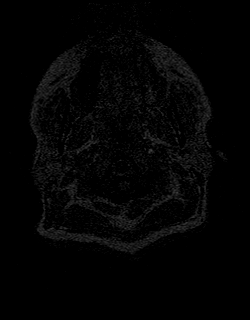
[im 16/144]
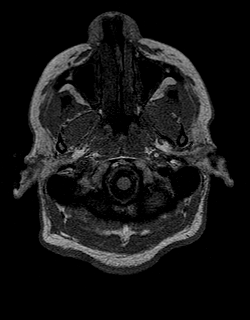
[im 32/144]
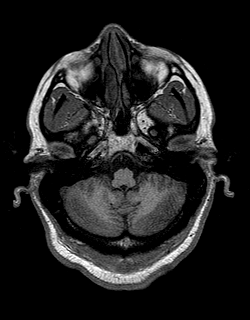
[im 48/144]
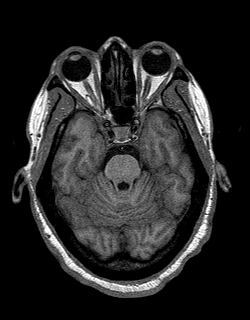
[im 64/144]
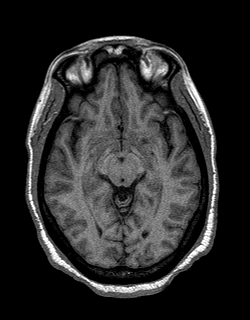
[im 80/144]
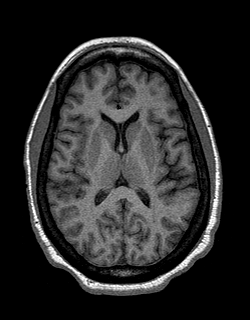
[im 96/144]
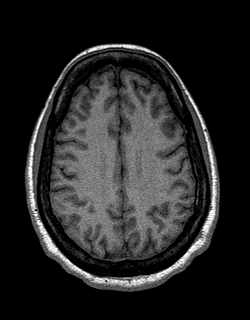
[im 112/144]
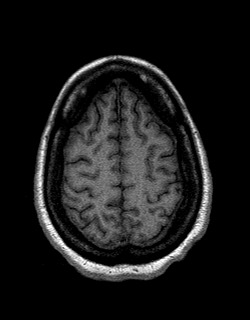
[im 128/144]
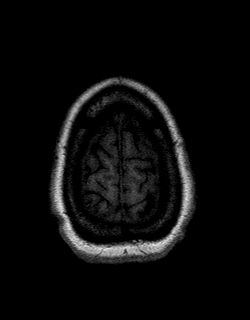
[im 144/144]
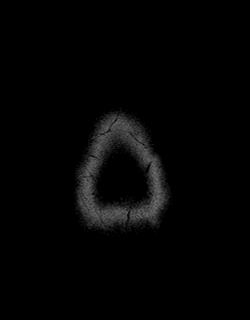

[Series 12: T2 · coronal · 5.0mm · 0.45mm/px · 2 of 27 slices shown (2 of 2)]
[im 1/27]
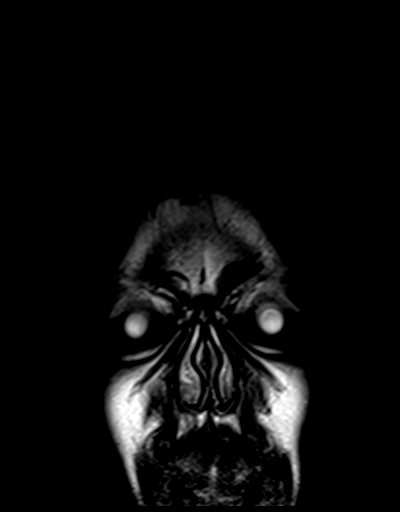
[im 27/27]
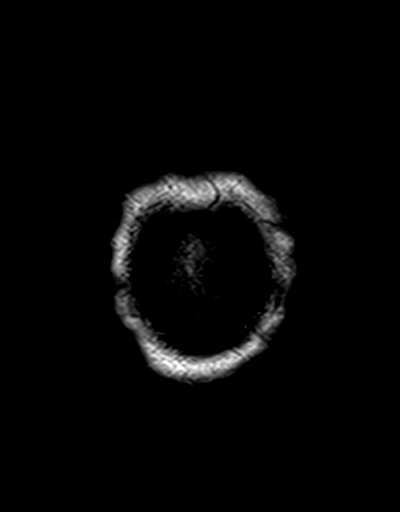

[Series 13: t1_mpr_tra post · axial · 1.0mm · 0.75mm/px · z∈[-92,+49]mm · 10 of 144 slices shown]
[im 1/144]
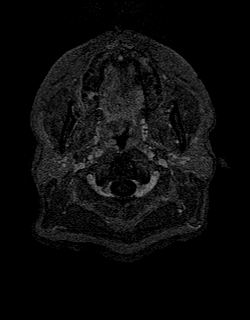
[im 16/144]
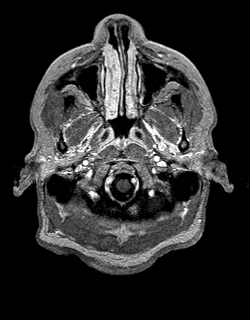
[im 32/144]
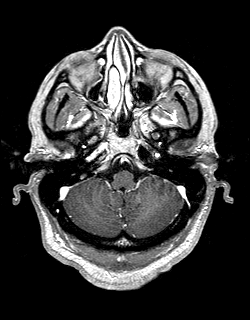
[im 48/144]
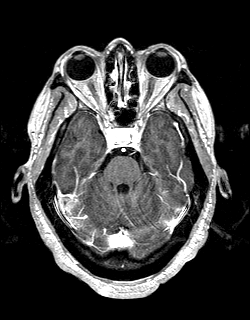
[im 64/144]
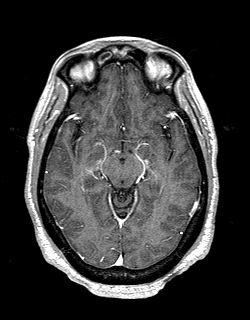
[im 80/144]
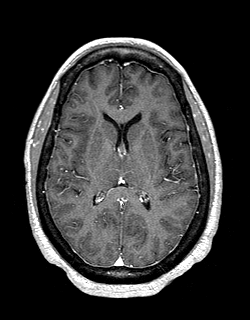
[im 96/144]
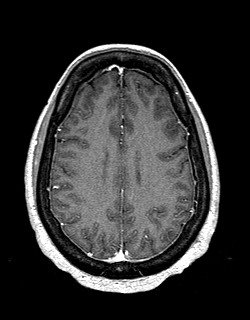
[im 112/144]
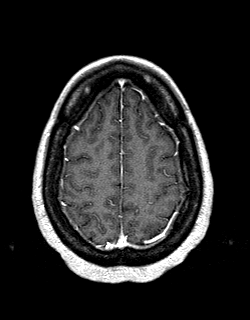
[im 128/144]
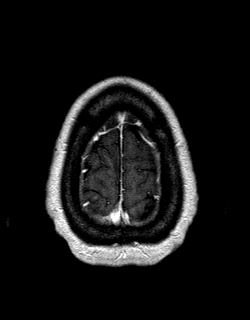
[im 144/144]
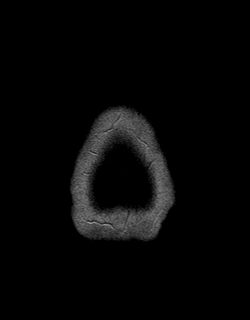

[Series 14: post cor · coronal · 5.0mm · 0.45mm/px · 2 of 27 slices shown]
[im 1/27]
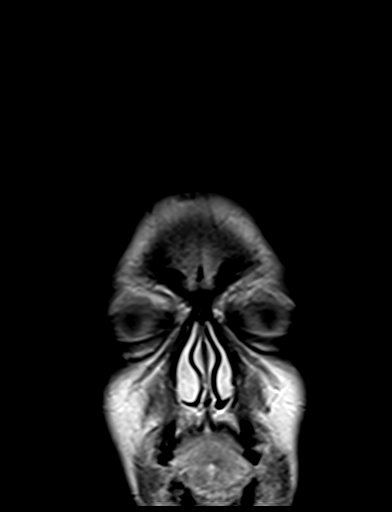
[im 27/27]
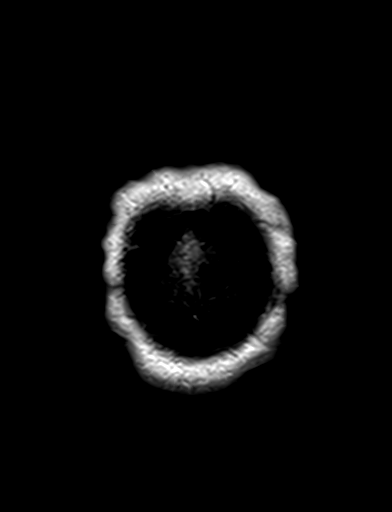

[48 of 48 positions shown; findings below may reference images not displayed]

FINDINGS: Brain: No acute infarction, hemorrhage, hydrocephalus, extra-axial
collection or mass lesion. The brain parenchyma has normal
morphology and signal characteristics. No focus of abnormal contrast
enhancement identified.

Vascular: Normal flow voids.

Skull and upper cervical spine: Normal marrow signal.

Sinuses/Orbits: Mucosal thickening throughout the paranasal sinuses.
The orbits are maintained.

Other: None.
IMPRESSION: 1. Unremarkable MRI of the brain.
2. Inflammatory paranasal sinus disease.

## 2024-02-29 ENCOUNTER — Other Ambulatory Visit: Payer: Self-pay

## 2024-03-02 ENCOUNTER — Ambulatory Visit: Payer: Self-pay | Admitting: Nurse Practitioner

## 2024-03-04 ENCOUNTER — Encounter: Payer: Self-pay | Attending: Nurse Practitioner | Admitting: Dietician

## 2024-03-04 ENCOUNTER — Encounter: Payer: Self-pay | Admitting: Dietician

## 2024-03-04 DIAGNOSIS — Z794 Long term (current) use of insulin: Secondary | ICD-10-CM | POA: Insufficient documentation

## 2024-03-04 DIAGNOSIS — E1165 Type 2 diabetes mellitus with hyperglycemia: Secondary | ICD-10-CM | POA: Insufficient documentation

## 2024-03-04 NOTE — Progress Notes (Signed)
 Diabetes Self-Management Education  Visit Type: Follow-up  Appt. Start Time: 1030 Appt. End Time: 1100  03/04/2024  [name], identified by name and date of birth, is a 53 y.o. female with a diagnosis of Diabetes: Type 2.   ASSESSMENT Patient is here today with her cousin. Fasting glucose:  127 this am.  Usually less than 140 before each meal  States that she rarely requires the meal time insulin . She has been more active. She is drinking more water , and diet tea now and has stopped sweetened beverages. Occasional low blood glucose (69 last week).  Drank small amount of lemonade.   She is followed by Lorain Baseman, Pharmacist and the RD communicated with her via phone and chat today. She is Enrolled in [facility] program at [facility] through June 2026 and gets her insulin  for free with this program at the [facility]. She was hospitalized 11/23/23-11/27/2023 for severe hypoglyemia. She has dysphagia related to narrowing esophagus and inability to chew foods well due to no teeth.   Referral:  Type 2 Diabetes (2011) ([employer], FNP) Medications include:  Basaglar  21 units bid, Humalog  before each meal 3 times a day per sliding scale (140-199 - 2 units, 200-250 - 4 units, 251-299 - 6 units, 300-349 - 8 units, 350 or above 10 units), levothyroxine , Lipitor, Vitamin D , iron, MVI, see lsit History includes:  Type 2 Diabetes, Schizoaffective disorder, dysphagia, edentulous, HTN, HLD, hypothyroidism, GERD, IBS- constipation Labs:  A1C 8.5% on 01/29/2024 increased from 8% 11/24/2023, 10.7% 10/23/2023, C-peptide was 4.4 11/24/2023, eGFR 61 on 12/11/2023   60 164 lbs 03/04/2024 157 lbs 01/22/2024 164 lbs 12/11/2023   Patient lives with her husband.  He does the shopping and cooking.  She states that she has reapplied for disability. She struggles with sweet tea and regular soda - now changed to water  and unsweetened tea Increased amount of sleeping. 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 She is watching her  30 yo grandson after school and they stay outside much of the time. She walks in the neighborhood every day.   Diabetes Self-Management Education - 03/04/24 1105       Visit Information   Visit Type Follow-up      Initial Visit   Diabetes Type Type 2    Are you taking your medications as prescribed? Yes      Psychosocial Assessment   Patient Belief/Attitude about Diabetes Motivated to manage diabetes    What is the hardest part about your diabetes right now, causing you the most concern, or is the most worrisome to you about your diabetes?   Making healty food and beverage choices;Getting support / problem solving    Self-care barriers Lack of material resources    Self-management support Doctor's office;CDE visits;Family    Other persons present Patient;Family Member    Patient Concerns Nutrition/Meal planning;Glycemic Control;Healthy Lifestyle;Problem Solving    Special Needs None    Preferred Learning Style No preference indicated    Learning Readiness Ready    How often do you need to have someone help you when you read instructions, pamphlets, or other written materials from your doctor or pharmacy? 4 - Often      Pre-Education Assessment   Patient understands the diabetes disease and treatment process. Needs Review    Patient understands incorporating nutritional management into lifestyle. Needs Review    Patient undertands incorporating physical activity into lifestyle.  Needs Review    Patient understands using medications safely. Needs Review    Patient understands monitoring blood glucose, interpreting and using results Needs Review    Patient understands prevention, detection, and treatment of acute complications. Needs Review    Patient understands prevention, detection, and treatment of chronic complications. Needs Review    Patient understands how to develop  strategies to address psychosocial issues. Needs Review    Patient understands how to develop strategies to promote health/change behavior. Needs Review      Complications   Last HgB A1C per patient/outside source 8.5 %   01/29/2024 increased from 8% on 11/24/2023   How often do you check your blood sugar? 3-4 times/day    Fasting Blood glucose range (mg/dL) 29-870;869-820;819-799    Number of hypoglycemic episodes per month 1    Can you tell when your blood sugar is low? Yes    What do you do if your blood sugar is low? drank a small amount of juice      Dietary Intake   Breakfast [place] sandwich on [employer] with mustard    Snack (morning) none    Lunch Big Mac    Snack (afternoon) none    Dinner BBQ chicken wings, homemade potato salad    Snack (evening) none    Beverage(s) water , unsweetened tea      Activity / Exercise   Activity / Exercise Type Light (walking / raking leaves)    How many days per week do you exercise? 7    How many minutes per day do you exercise? 30    Total minutes per week of exercise 210      Patient Education   Previous Diabetes Education No    Healthy Eating Plate Method;Meal options for control of blood glucose level and chronic complications.    Being Active Role of exercise on diabetes management, blood pressure control and cardiac health.    Medications Reviewed patients medication for diabetes, action, purpose, timing of dose and side effects.    Monitoring Identified appropriate SMBG and/or A1C goals.    Acute complications Taught prevention, symptoms, and  treatment of hypoglycemia - the 15 rule.    Chronic complications Relationship between chronic complications and blood glucose control    Diabetes Stress and Support Identified and addressed patients feelings and concerns about diabetes;Worked with patient to identify barriers to care and solutions      Individualized Goals (developed by patient)   Nutrition General guidelines for healthy choices and  portions discussed    Physical Activity Exercise 5-7 days per week;30 minutes per day    Medications take my medication as prescribed    Monitoring  Test my blood glucose as discussed    Problem Solving Addressing barriers to behavior change;Eating Pattern    Reducing Risk examine blood glucose patterns;do foot checks daily;treat hypoglycemia with 15 grams of carbs if blood glucose less than 70mg /dL      Patient Self-Evaluation of Goals - Patient rates self as meeting previously set goals (% of time)   Nutrition 50 - 75 % (half of the time)    Physical Activity >75% (most of the time)    Medications >75% (most of the time)    Monitoring >75% (most of the time)    Problem Solving and behavior change strategies  50 - 75 % (half of the time)    Reducing Risk (treating acute and chronic complications) >75% (most of the time)    Health Coping >75% (  most of the time)      Post-Education Assessment   Patient understands the diabetes disease and treatment process. Comprehends key points    Patient understands incorporating nutritional management into lifestyle. Needs Review    Patient undertands incorporating physical activity into lifestyle. Comprehends key points    Patient understands using medications safely. Comphrehends key points    Patient understands monitoring blood glucose, interpreting and using results Comprehends key points    Patient understands prevention, detection, and treatment of acute complications. Comprehends key points    Patient understands prevention, detection, and treatment of chronic complications. Comprehends key points    Patient understands how to develop strategies to address psychosocial issues. Needs Review    Patient understands how to develop strategies to promote health/change behavior. Needs Review      Outcomes   Expected Outcomes Demonstrated interest in learning but significant barriers to change    Future DMSE 2 months    Program Status Not Completed       Subsequent Visit   Since your last visit have you continued or begun to take your medications as prescribed? Yes    Since your last visit have you experienced any weight changes? Gain    Weight Gain (lbs) 6    Since your last visit, are you checking your blood glucose at least once a day? Yes          Individualized Plan for Diabetes Self-Management Training:   Learning Objective:  Patient will have a greater understanding of diabetes self-management. Patient education plan is to attend individual and/or group sessions per assessed needs and concerns.   Plan:   Patient Instructions  Walk daily Drink plenty of water . Add more vegetables (half your plate non-starchy vegetables). Continue to check your blood glucose 3-4 times per day.   Medications: Continue to rotate your insulin  injection sites. Be sure to take your medication consistently.  The doses may change but currently are those below. Basaglar  21 units bid, Humalog  before each meal 3 times a day per sliding scale (140-199 - 2 units, 200-250 - 4 units, 251-299 - 6 units, 300-349 - 8 units, 350 or above 10 units)  Expected Outcomes:  Demonstrated interest in learning but significant barriers to change  Education material provided:   If problems or questions, patient to contact team via:  Phone  Future DSME appointment: 2 months

## 2024-03-04 NOTE — Patient Instructions (Addendum)
 Walk daily Drink plenty of water . Add more vegetables (half your plate non-starchy vegetables). Continue to check your blood glucose 3-4 times per day.   Medications: Continue to rotate your insulin  injection sites. Be sure to take your medication consistently.  The doses may change but currently are those below. Basaglar  21 units bid, Humalog  before each meal 3 times a day per sliding scale (140-199 - 2 units, 200-250 - 4 units, 251-299 - 6 units, 300-349 - 8 units, 350 or above 10 units)

## 2024-03-09 ENCOUNTER — Encounter (INDEPENDENT_AMBULATORY_CARE_PROVIDER_SITE_OTHER): Payer: Self-pay | Admitting: Gastroenterology

## 2024-03-09 ENCOUNTER — Other Ambulatory Visit: Payer: Self-pay

## 2024-03-09 ENCOUNTER — Other Ambulatory Visit: Payer: Self-pay | Admitting: Nurse Practitioner

## 2024-03-09 ENCOUNTER — Telehealth: Payer: Self-pay | Admitting: Nurse Practitioner

## 2024-03-09 ENCOUNTER — Other Ambulatory Visit (HOSPITAL_COMMUNITY): Payer: Self-pay

## 2024-03-09 NOTE — Telephone Encounter (Signed)
 Please advise [facility]

## 2024-03-09 NOTE — Telephone Encounter (Signed)
 Copied from CRM #[id number]. Topic: General - Other >> Mar 09, 2024  1:50 PM Wess RAMAN wrote: Reason for CRM: [employer] would like the office visit notes as well as faxed documents returned that was sent on 03/02/24  Callback #: [phone] ext: [phone] Fax #: [phone]

## 2024-03-10 NOTE — Telephone Encounter (Signed)
 Done [facility]

## 2024-03-14 ENCOUNTER — Ambulatory Visit (INDEPENDENT_AMBULATORY_CARE_PROVIDER_SITE_OTHER): Admitting: Audiology

## 2024-03-14 ENCOUNTER — Institutional Professional Consult (permissible substitution) (INDEPENDENT_AMBULATORY_CARE_PROVIDER_SITE_OTHER): Admitting: Otolaryngology

## 2024-03-15 ENCOUNTER — Other Ambulatory Visit: Payer: Self-pay

## 2024-03-17 ENCOUNTER — Other Ambulatory Visit: Payer: Self-pay

## 2024-03-28 ENCOUNTER — Encounter: Payer: Self-pay | Admitting: Radiology

## 2024-03-28 ENCOUNTER — Telehealth: Payer: Self-pay

## 2024-03-28 NOTE — Telephone Encounter (Signed)
 Called pt husband to ask question concern FMLA paperwork. No Answer lvm. KH

## 2024-03-30 ENCOUNTER — Ambulatory Visit (HOSPITAL_COMMUNITY)
Admission: RE | Admit: 2024-03-30 | Discharge: 2024-03-30 | Disposition: A | Discharge status: Home / Self Care | Payer: Self-pay | Source: Ambulatory Visit | Attending: Nurse Practitioner | Admitting: Nurse Practitioner

## 2024-03-30 ENCOUNTER — Encounter (HOSPITAL_COMMUNITY): Payer: Self-pay

## 2024-03-30 DIAGNOSIS — Z1231 Encounter for screening mammogram for malignant neoplasm of breast: Secondary | ICD-10-CM | POA: Insufficient documentation

## 2024-04-01 ENCOUNTER — Other Ambulatory Visit: Payer: Self-pay

## 2024-04-01 ENCOUNTER — Ambulatory Visit (INDEPENDENT_AMBULATORY_CARE_PROVIDER_SITE_OTHER): Payer: Self-pay | Admitting: Nurse Practitioner

## 2024-04-01 ENCOUNTER — Encounter: Payer: Self-pay | Admitting: Nurse Practitioner

## 2024-04-01 VITALS — BP 111/80 | HR 103 | Wt 168.0 lb

## 2024-04-01 DIAGNOSIS — I10 Essential (primary) hypertension: Secondary | ICD-10-CM

## 2024-04-01 DIAGNOSIS — Z23 Encounter for immunization: Secondary | ICD-10-CM | POA: Diagnosis not present

## 2024-04-01 DIAGNOSIS — E559 Vitamin D deficiency, unspecified: Secondary | ICD-10-CM

## 2024-04-01 DIAGNOSIS — F4310 Post-traumatic stress disorder, unspecified: Secondary | ICD-10-CM | POA: Diagnosis not present

## 2024-04-01 DIAGNOSIS — E785 Hyperlipidemia, unspecified: Secondary | ICD-10-CM

## 2024-04-01 DIAGNOSIS — Z0000 Encounter for general adult medical examination without abnormal findings: Secondary | ICD-10-CM | POA: Diagnosis not present

## 2024-04-01 DIAGNOSIS — E1165 Type 2 diabetes mellitus with hyperglycemia: Secondary | ICD-10-CM | POA: Diagnosis not present

## 2024-04-01 DIAGNOSIS — Z794 Long term (current) use of insulin: Secondary | ICD-10-CM | POA: Diagnosis not present

## 2024-04-01 DIAGNOSIS — Z1211 Encounter for screening for malignant neoplasm of colon: Secondary | ICD-10-CM

## 2024-04-01 DIAGNOSIS — E039 Hypothyroidism, unspecified: Secondary | ICD-10-CM | POA: Diagnosis not present

## 2024-04-01 MED ORDER — AMLODIPINE BESYLATE 5 MG PO TABS
5.0000 mg | ORAL_TABLET | Freq: Every day | ORAL | 11 refills | Status: AC
  Filled 2024-04-01: qty 30, 30d supply, fill #0
  Filled 2024-04-26 (×3): qty 30, 30d supply, fill #1
  Filled 2024-06-16: qty 30, 30d supply, fill #2

## 2024-04-01 NOTE — Progress Notes (Signed)
 Established Patient Office Visit  Subjective:  Patient ID: Kathryn [name], female    DOB: Oct 03, 1970  Age: 53 y.o. MRN: [id number]  CC:  Chief Complaint  Patient presents with   Medical Management of Chronic Issues    HPI    Discussed the use of AI scribe software for clinical note transcription with the patient, who gave verbal consent to proceed.  History of Present Illness Kathryn [name] is a 53 year old female  has a past medical history of Anemia, Anxiety, Asthma, Depression, GERD (gastroesophageal reflux disease), HLD (hyperlipidemia) (04/07/2019), Hypertension, Hypothyroidism, adult (04/07/2019), Neuropathy, Obesity (BMI 30.0-34.9) (04/07/2019), Schizoaffective disorder (HCC), and Type II diabetes mellitus, uncontrolled (04/27/2019). with diabetes who presents for diabetes management.  She has been experiencing difficulty managing her diabetes, with elevated blood glucose levels attributed to the consumption of sweets and soda. Her hemoglobin A1c was 8.5% in September. Blood glucose readings have been in the high 200s, with occasional readings between 90 and 150 when not fasting. She is currently using a sliding scale with Humalog  and takes 21 units of Basaglar  insulin  twice daily.  She is taking losartan  100 mg daily and amlodipine  5 mg daily for blood pressure management, which has been stable.   She also takes levothyroxine  75 mcg daily for hypothyroidism  She has been seeing a psychiatrist at [facility] taking her prescribed medications as ordered and is scheduled for a follow-up on December 17th. No thoughts of self-harm.  She is currently unemployed , she will apply for Medicaid. She is awaiting approval for permanent disability benefits from the government. She engages in outreach activities for her church and reports doing a lot of walking for exercise.    Assessment & Plan    Lab Results  Component Value Date   HGBA1C 8.5 (H) 01/29/2024    Past  Medical History:  Diagnosis Date   Anemia    Anxiety    Asthma    Depression    GERD (gastroesophageal reflux disease)    HLD (hyperlipidemia) 04/07/2019   Hypertension    Hypothyroidism, adult 04/07/2019   Neuropathy    Obesity (BMI 30.0-34.9) 04/07/2019   Schizoaffective disorder (HCC)    Type II diabetes mellitus, uncontrolled 04/27/2019    Past Surgical History:  Procedure Laterality Date   BACK SURGERY  09/06/2020   BIOPSY  05/03/2021   Procedure: BIOPSY;  Surgeon: Eartha Angelia Sieving, MD;  Location: [facility];  Service: Gastroenterology;;   BREAST SURGERY Right    biopsy   CESAREAN SECTION     CHOLECYSTECTOMY  06/02/2012   Procedure: LAPAROSCOPIC CHOLECYSTECTOMY;  Surgeon: Oneil DELENA Budge, MD;  Location: [facility];  Service: General;  Laterality: N/A;  Attempted laparoscopic cholecystectomy   CHOLECYSTECTOMY  06/02/2012   Procedure: CHOLECYSTECTOMY;  Surgeon: Oneil DELENA Budge, MD;  Location: [facility];  Service: General;  Laterality: N/A;  converted to open at  9094   COLONOSCOPY WITH PROPOFOL  N/A 07/18/2020   prep was fair, one 4mm polyp (inflammatory) stool in descending colon, transverse and ascending, distal rectum and anal verge normal   ESOPHAGEAL DILATION  12/31/2018   Procedure: ESOPHAGEAL DILATION;  Surgeon: Golda Claudis PENNER, MD;  [facility]: [facility];  Service: Endoscopy;;   ESOPHAGOGASTRODUODENOSCOPY (EGD) WITH PROPOFOL  N/A 08/24/2015   Procedure: ESOPHAGOGASTRODUODENOSCOPY (EGD) WITH PROPOFOL ;  Surgeon: Claudis PENNER Golda, MD;  Location: [facility];  Service: Endoscopy;  Laterality: N/A;  1:10 - Ann to notify pt to arrive at 11:30  ESOPHAGOGASTRODUODENOSCOPY (EGD) WITH PROPOFOL  N/A 12/31/2018   normal, no abnormality to explain dysphagia, esophagus dilated.   ESOPHAGOGASTRODUODENOSCOPY (EGD) WITH PROPOFOL  N/A 05/03/2021   Procedure: ESOPHAGOGASTRODUODENOSCOPY (EGD) WITH PROPOFOL ;  Surgeon: Eartha Angelia Sieving, MD;  [facility]: [facility];  Service:  Gastroenterology;  Laterality: N/A;  1:20   FLEXIBLE SIGMOIDOSCOPY  07/17/2020   Procedure: FLEXIBLE SIGMOIDOSCOPY;  Surgeon: Eartha Angelia, Sieving, MD;  Location: [facility];  Service: Gastroenterology;;   POLYPECTOMY  07/18/2020   Procedure: POLYPECTOMY INTESTINAL;  Surgeon: Eartha Angelia, Sieving, MD;  [facility]: [facility];  Service: Gastroenterology;;  ascending colon polyp;    SAVORY DILATION  05/03/2021   Procedure: SAVORY DILATION;  Surgeon: Eartha Angelia Sieving, MD;  Location: [facility];  Service: Gastroenterology;;   tooth removal  2019   all teeth removed   TUBAL LIGATION     X3    Family History  Problem Relation Age of Onset   Hypertension Mother    Alcohol abuse Mother    Heart disease Mother    Kidney disease Mother    Aneurysm Mother        brain   Hypertension Father    Alcohol abuse Sister    Alcohol abuse Maternal Aunt    Cancer Maternal Aunt    Alcohol abuse Paternal Aunt    Alcohol abuse Maternal Grandmother    Alcohol abuse Maternal Grandfather    Hearing loss Daughter    Alcohol abuse Cousin    Stroke Other    Diabetes Other    Cancer Other    Seizures Other     Social History   Socioeconomic History   Marital status: Married    Spouse name: [name]   Number of children: 3   Years of education: 12   Highest education level: Not on file  Occupational History    Comment: [employer]    Comment: 3rd shift  Tobacco Use   Smoking status: Never    Passive exposure: Never   Smokeless tobacco: Never  Vaping Use   Vaping status: Never Used  Substance and Sexual Activity   Alcohol use: No    Alcohol/week: 0.0 standard drinks of alcohol   Drug use: No   Sexual activity: Not Currently    Birth control/protection: Surgical  Other Topics Concern   Not on file  Social History Narrative   Married for 22 years.On disability secondary to schizophrenia.   Lives with husband.   Caffeine- decaf coffee, tea   Social Drivers of Manufacturing Engineer Strain: Not on file  Food Insecurity: Food Insecurity Present (01/30/2024)   Hunger Vital Sign    Worried About Running Out of Food in the Last Year: Sometimes true    Ran Out of Food in the Last Year: Sometimes true  Transportation Needs: No Transportation Needs (01/30/2024)   PRAPARE - Administrator, Civil Service (Medical): No    Lack of Transportation (Non-Medical): No  Physical Activity: Not on file  Stress: Not on file  Social Connections: Socially Isolated (01/30/2024)   Social Connection and Isolation Panel    Frequency of Communication with Friends and Family: Never    Frequency of Social Gatherings with Friends and Family: Never    Attends Religious Services: Never    Database Administrator or Organizations: No    Attends Banker Meetings: Never    Marital Status: Married  Catering Manager Violence: Not At Risk (01/30/2024)   Humiliation, Afraid, Rape,  and Kick questionnaire    Fear of Current or Ex-Partner: No    Emotionally Abused: No    Physically Abused: No    Sexually Abused: No    Outpatient Medications Prior to Visit  Medication Sig Dispense Refill   albuterol  (VENTOLIN  HFA) 108 (90 Base) MCG/ACT inhaler Inhale 2 puffs into the lungs every 6 (six) hours as needed for wheezing or shortness of breath. 8 g 2   amLODipine  (NORVASC ) 5 MG tablet Take 1 tablet (5 mg total) by mouth daily. 30 tablet 11   aspirin  EC 81 MG tablet Take 81 mg by mouth daily.     atorvastatin  (LIPITOR) 10 MG tablet Take 1 tablet (10 mg total) by mouth daily. 90 tablet 2   benztropine  (COGENTIN ) 1 MG tablet Take 1 tablet (1 mg total) by mouth at bedtime. 90 tablet 0   benztropine  (COGENTIN ) 1 MG tablet Take 1 tablet (1 mg total) by mouth at bedtime. 30 tablet 2   Blood Glucose Monitoring Suppl (TRUE METRIX METER) w/Device KIT Use as directed to monitor blood sugar. 1 kit 0   Cholecalciferol (VITAMIN D -3) 125 MCG (5000 UT) TABS Take 2 tablets by mouth  daily. 30 tablet 1   DULoxetine  (CYMBALTA ) 60 MG capsule Take 1 capsule (60 mg total) by mouth 2 (two) times daily. 180 capsule 2   ferrous sulfate  325 (65 FE) MG tablet Take 325 mg by mouth daily with breakfast.     gabapentin  (NEURONTIN ) 300 MG capsule Take 1 capsule (300 mg total) by mouth daily. 90 capsule 2   Glucagon  (GVOKE HYPOPEN  2-PACK) 1 MG/0.2ML SOAJ Inject 1 mg into the skin daily as needed. 0.2 mL 1   glucose blood (TRUE METRIX BLOOD GLUCOSE TEST) test strip Use as instructed to monitor blood sugar twice daily 50 each 11   Insulin  Glargine (BASAGLAR  KWIKPEN) 100 UNIT/ML Inject 21 Units into the skin 2 (two) times daily. 12 mL 4   insulin  lispro (HUMALOG ) 100 UNIT/ML KwikPen Take before each meal 3 times a day, 140-199 - 2 units, 200-250 - 4 units, 251-299 - 6 units, 300-349 - 8 units, 350 or above 10 units 15 mL 3   Insulin  Pen Needle (PEN NEEDLES) 31G X 6 MM MISC For 4 times a day insulin , 1 month supply. Diagnosis E11.65 100 each 0   levothyroxine  (SYNTHROID ) 75 MCG tablet Take 1 tablet (75 mcg total) by mouth daily before breakfast. 90 tablet 1   lithium  carbonate (LITHOBID ) 300 MG ER tablet Take 1 tablet (300 mg total) by mouth daily. 30 tablet 0   lithium  carbonate 300 MG capsule Take 1 capsule (300 mg total) by mouth daily. 30 capsule 2   losartan  (COZAAR ) 100 MG tablet Take 1 tablet (100 mg total) by mouth daily. 90 tablet 3   Multiple Vitamin (MULTIVITAMIN) tablet Take 1 tablet by mouth daily.     risperiDONE  (RISPERDAL ) 3 MG tablet Take 2 tablets (6 mg total) by mouth at bedtime. 60 tablet 2   rizatriptan  (MAXALT ) 10 MG tablet Take 1 tablet (10 mg total) by mouth as needed for migraine. May repeat in 2 hours if needed 10 tablet 2   traZODone  (DESYREL ) 50 MG tablet Take 1 tablet (50 mg total) by mouth at bedtime as needed for sleep. 30 tablet 0   TRUEplus Lancets 30G MISC Use as directed to monitor blood sugar twice daily 100 each 3   DULoxetine  (CYMBALTA ) 60 MG capsule Take 1  capsule (60 mg total) by mouth  2 (two) times daily. 60 capsule 2   traZODone  (DESYREL ) 50 MG tablet Take 1 tablet (50 mg total) by mouth at bedtime. 30 tablet 2   No facility-administered medications prior to visit.    Allergies  Allergen Reactions   Metformin Hcl Diarrhea    ROS Review of Systems  Constitutional:  Negative for appetite change, chills, fatigue and fever.  HENT:  Negative for congestion, postnasal drip, rhinorrhea and sneezing.   Respiratory:  Negative for cough, shortness of breath and wheezing.   Cardiovascular:  Negative for chest pain, palpitations and leg swelling.  Gastrointestinal:  Negative for abdominal pain, constipation, nausea and vomiting.  Genitourinary:  Negative for difficulty urinating, dysuria, flank pain and frequency.  Musculoskeletal:  Negative for arthralgias, back pain, joint swelling and myalgias.  Skin:  Negative for color change, pallor, rash and wound.  Neurological:  Negative for dizziness, facial asymmetry, weakness, numbness and headaches.  Psychiatric/Behavioral:  Negative for behavioral problems, confusion, self-injury and suicidal ideas.       Objective:    Physical Exam Vitals and nursing note reviewed.  Constitutional:      General: She is not in acute distress.    Appearance: Normal appearance. She is obese. She is not ill-appearing, toxic-appearing or diaphoretic.  Cardiovascular:     Rate and Rhythm: Normal rate and regular rhythm.     Pulses: Normal pulses.     Heart sounds: Normal heart sounds. No murmur heard.    No friction rub. No gallop.  Pulmonary:     Effort: Pulmonary effort is normal. No respiratory distress.     Breath sounds: Normal breath sounds. No stridor. No wheezing, rhonchi or rales.  Chest:     Chest wall: No tenderness.  Abdominal:     General: There is no distension.     Palpations: Abdomen is soft.     Tenderness: There is no abdominal tenderness. There is no right CVA tenderness, left CVA  tenderness or guarding.  Musculoskeletal:        General: No swelling, tenderness, deformity or signs of injury.     Right lower leg: No edema.     Left lower leg: No edema.  Skin:    General: Skin is warm and dry.     Capillary Refill: Capillary refill takes less than 2 seconds.     Coloration: Skin is not jaundiced or pale.     Findings: No bruising, erythema or lesion.  Neurological:     Mental Status: She is alert and oriented to person, place, and time.     Motor: No weakness.     Gait: Gait normal.  Psychiatric:        Mood and Affect: Mood normal.        Behavior: Behavior normal.        Thought Content: Thought content normal.        Judgment: Judgment normal.     BP 111/80   Pulse (!) 103   Wt 168 lb (76.2 kg)   LMP 01/23/2013   SpO2 98%   BMI 30.73 kg/m  Wt Readings from Last 3 Encounters:  04/01/24 168 lb (76.2 kg)  02/17/24 161 lb (73 kg)  01/29/24 156 lb (70.8 kg)    Lab Results  Component Value Date   TSH 0.228 (L) 01/29/2024   Lab Results  Component Value Date   WBC 6.1 01/29/2024   HGB 12.1 01/29/2024   HCT 36.3 01/29/2024   MCV 85.2 01/29/2024   PLT  306 01/29/2024   Lab Results  Component Value Date   NA 138 01/29/2024   K 3.8 01/29/2024   CO2 23 01/29/2024   GLUCOSE 264 (H) 01/29/2024   BUN 22 (H) 01/29/2024   CREATININE 1.05 (H) 01/29/2024   BILITOT 1.2 01/29/2024   ALKPHOS 72 01/29/2024   AST 12 (L) 01/29/2024   ALT 15 01/29/2024   PROT 8.1 01/29/2024   ALBUMIN 4.2 01/29/2024   CALCIUM  10.0 01/29/2024   ANIONGAP 11 01/29/2024   EGFR 61 12/11/2023   Lab Results  Component Value Date   CHOL 111 01/29/2024   Lab Results  Component Value Date   HDL 57 01/29/2024   Lab Results  Component Value Date   LDLCALC 39 01/29/2024   Lab Results  Component Value Date   TRIG 76 01/29/2024   Lab Results  Component Value Date   CHOLHDL 1.9 01/29/2024   Lab Results  Component Value Date   HGBA1C 8.5 (H) 01/29/2024       Assessment & Plan:   Problem List Items Addressed This Visit       Cardiovascular and Mediastinum   Essential hypertension, benign   BP Readings from Last 3 Encounters:  04/01/24 111/80  02/17/24 128/70  01/29/24 119/75   Blood pressure well-controlled with current medication regimen. - Continue Losartan  100 mg oral daily. - Continue Amlodipine  5 mg oral daily. DASH diet advised encouraged moderate exercises at least Kathryn [name] 50 minutes weekly as tolerated          Endocrine   Hypothyroidism   Lab Results  Component Value Date   TSH 0.228 (L) 01/29/2024   Managed with levothyroxine  75 mcg daily. Previous TSH levels slightly low, necessitating monitoring. - Checked thyroid  function labs today. - Continue levothyroxine  75 mcg oral daily.       Relevant Orders   TSH + free T4   Uncontrolled type 2 diabetes mellitus with hyperglycemia, with long-term current use of insulin  (HCC) - Primary   Lab Results  Component Value Date   HGBA1C 8.5 (H) 01/29/2024   Uncontrolled type 2 diabetes mellitus with hyperglycemia, on insulin  Diabetes remains uncontrolled with A1c of 9.0. High blood glucose levels due to dietary indiscretions. Discussed risks of complications and emphasized dietary modifications and monitoring. Discussed potential benefits of a new waterproof continuous glucose monitor. - Avoid soda and orange juice; drink water  instead. - Continue Basaglar  21 units subcutaneous bid and Humalog  sliding scale. - Check A1c in one month. - Consider new freestyle libre - Encouraged adherence to diabetes nutrition class recommendations. Appreciate collaboration with the clinical pharmacist, encouraged to keep upcoming appointment with them       Relevant Orders   Hemoglobin A1c     Other   PTSD (post-traumatic stress disorder)   Followed by psychiatrist Continue current medications and maintain close follow-up with psychiatrist      Dyslipidemia, goal LDL below 70   Lab  Results  Component Value Date   CHOL 111 01/29/2024   HDL 57 01/29/2024   LDLCALC 39 01/29/2024   TRIG 76 01/29/2024   CHOLHDL 1.9 01/29/2024  Controlled on atorvastatin  10 mg daily Continue current medication      Vitamin D  deficiency   Last vitamin D  Lab Results  Component Value Date   VD25OH 25.0 (L) 07/24/2023  Stated that she is Taking 10,000 units daily? Checking vitamin D  level      Relevant Orders   VITAMIN D  25 Hydroxy (Vit-D Deficiency, Fractures)   Need  for influenza vaccination   Relevant Orders   Flu vaccine trivalent PF, 6mos and older(Flulaval,Afluria,Fluarix,Fluzone) (Completed)   Health care maintenance    Due for colonoscopy and pneumonia vaccine. - Referred to gastroenterologist for colonoscopy. - Obtain pneumonia vaccine at pharmacy.      Screening for colon cancer   Relevant Orders   Ambulatory referral to Gastroenterology    No orders of the defined types were placed in this encounter.   Follow-up: Return in about 4 months (around 07/30/2024) for DM, HTN, HYPERLIPIDEMIA.    Stefani Baik R Taimane Stimmel, FNP

## 2024-04-01 NOTE — Assessment & Plan Note (Addendum)
 Lab Results  Component Value Date   TSH 0.228 (L) 01/29/2024   Managed with levothyroxine  75 mcg daily. Previous TSH levels slightly low, necessitating monitoring. - Checked thyroid  function labs today. - Continue levothyroxine  75 mcg oral daily.

## 2024-04-01 NOTE — Assessment & Plan Note (Signed)
 Lab Results  Component Value Date   CHOL 111 01/29/2024   HDL 57 01/29/2024   LDLCALC 39 01/29/2024   TRIG 76 01/29/2024   CHOLHDL 1.9 01/29/2024  Controlled on atorvastatin  10 mg daily Continue current medication

## 2024-04-01 NOTE — Assessment & Plan Note (Signed)
  Due for colonoscopy and pneumonia vaccine. - Referred to gastroenterologist for colonoscopy. - Obtain pneumonia vaccine at pharmacy.

## 2024-04-01 NOTE — Patient Instructions (Signed)
 Goal for fasting blood sugar ranges from 80 to 120 and 2 hours after any meal or at bedtime should be between 130 to 170.    It is important that you exercise regularly at least 30 minutes 5 times a week as tolerated  Think about what you will eat, plan ahead. Choose  clean, green, fresh or frozen over canned, processed or packaged foods which are more sugary, salty and fatty. 70 to 75% of food eaten should be vegetables and fruit. Three meals at set times with snacks allowed between meals, but they must be fruit or vegetables. Aim to eat over a 12 hour period , example 7 am to 7 pm, and STOP after  your last meal of the day. Drink water,generally about 64 ounces per day, no other drink is as healthy. Fruit juice is best enjoyed in a healthy way, by EATING the fruit.  Thanks for choosing [facility] we consider it a privelige to serve you.

## 2024-04-01 NOTE — Assessment & Plan Note (Signed)
 Lab Results  Component Value Date   HGBA1C 8.5 (H) 01/29/2024   Uncontrolled type 2 diabetes mellitus with hyperglycemia, on insulin  Diabetes remains uncontrolled with A1c of 9.0. High blood glucose levels due to dietary indiscretions. Discussed risks of complications and emphasized dietary modifications and monitoring. Discussed potential benefits of a new waterproof continuous glucose monitor. - Avoid soda and orange juice; drink water  instead. - Continue Basaglar  21 units subcutaneous bid and Humalog  sliding scale. - Check A1c in one month. - Consider new freestyle libre - Encouraged adherence to diabetes nutrition class recommendations. Appreciate collaboration with the clinical pharmacist, encouraged to keep upcoming appointment with them

## 2024-04-01 NOTE — Assessment & Plan Note (Signed)
 BP Readings from Last 3 Encounters:  04/01/24 111/80  02/17/24 128/70  01/29/24 119/75   Blood pressure well-controlled with current medication regimen. - Continue Losartan  100 mg oral daily. - Continue Amlodipine  5 mg oral daily. DASH diet advised encouraged moderate exercises at least Kathryn [name] 50 minutes weekly as tolerated

## 2024-04-01 NOTE — Progress Notes (Signed)
 04/01/2024 Name: Kathryn [name] MRN: [id number] DOB: June 29, 1970  Chief Complaint  Patient presents with   Diabetes   Hypertension    Kathryn [name] is a 53 y.o. year old female who presented for a telephone visit.   They were referred to the pharmacist by their PCP for assistance in managing diabetes. PMH includes HTN, asthma, IBS, dysphagia, gastroparesis with abnormal esophagram, T2DM with neuropathy, PTSD, schizoaffective disorder, BMI > 30.    Subjective: Patient was last seen by PCP, Kathryn Shall, NP on 10/23/23. She reported taking glipizide  10 mg BID. She was not taking Jardiance  due to cost, and report she had not taken Tresiba  or Trulicity  in months. She reported trying to follow a low-carb diet and avoid soda, but still drinks sweet tea. She was instructed to restart Tresiba  80 units daily and Trulicity .  At pharmacy telephone appointment on 11/06/23, patient reported that she picked up Tresiba  from the pharmacy but it was > $200. She was not able to pick up Trulicity . She reported that she does not currently have insurance because open-enrollment at her job does not open until the fall. She has applied to Medicaid in the past, but reports it was > 2 years ago. She continued to report s/sx of hyperglycemia but did not have a way to monitor her BG. She was instructed to split her dose of Tresiba  for better absorption, continue glipizide , and start Trulicity  (via [facility] supply). She was also instructed to start monitoring her BG and was provided a prescription for the True Metrix meter.   Unfortunately, she was admitted from 11/23/23 to 11/27/23 for recurrent hypoglycemia (BG 49-63 mg/dL despite treatment with OJ). At discharge she was instructed to discontinue glipizide  and Trulicity , and take Basaglar  15 units daily and Novolog  sliding scale 3 times daily before meals. Her BP was well controlled inpatient on ARB monotherapy, so she was also instructed to stop amlodipine  and carvedilol , and  continue losartan  25 mg daily. During her TOC follow-up call, it was identified that she has been dismissed from her psychiatry provider. Her GAD-7 was 21 and patient reported being out of her benztropine  and had 3 days remaining of clonazepam .   Patient was engaged by pharmacy via telephone on 12/04/23. She reported taking her Basaglar  and Novolog  as prescribed and regularly checking her BG. She discontinued amlodipine  and carvedilol  as instructed, but was still taking losartan  100 mg daily instead of 25 mg daily. She was instructed to continue her current regimen and monitor for hypoglycemia. She saw her PCP on 12/11/23.BP was 139/76 mmHg. She reported FBG 79-150 mg/dL and PPG 859-799 mg/dL. She was instructed to increase her Basaglar  to 17 units twice daily. Clarified with PCP that she meant to increase to 17 units once daily.  At pharmacy follow-up on 01/08/24, patient reported taking Basaglar  17 units BID but sometimes forgetting her AM dose. She was overall stable, and no medication changes were made. She was seen by PCP on 01/29/24 for suicidal ideation and was subsequently admitted for schizophrenia. She also reported FBG in the 300s and her Basaglar  was increased to 19 units BID. A1C increased from 8.0% to 8.5%.  Her psychiatric medications were adjusted and she was instructed to stop topiramate  and clonazepam  and start litihium ER 300 mg daily for improved energy. They also adjusted her risperidone  to 6 mg at bedtime (rather than BID). At PCP visit on 02/17/24, she was instructed to increase Basaglar  to 21 units BID.   Today, patient reports  doing ok. She has run out of a few medications including amlodipine  5 mg, atorvastatin , lithium , and duloxetine . Requests assistance filling these today. She states she has not been working.    Care Team: Primary Care Provider: Paseda, Folashade R, FNP ; Next Scheduled Visit: 04/01/24 Behavioral Health: established with new provider 02/08/24 after hospital  discharge   Medication Access/Adherence  Current Pharmacy:  [facility] [address], [address] [place] [address] Phone: [phone] Fax: [phone]   Patient reports affordability concerns with their medications: Yes - does not anticipate gaining insurance, no longer working. Working on financial controller. Patient reports access/transportation concerns to their pharmacy: No  Patient reports adherence concerns with their medications:  Yes  - patient has a history of stopping medications without reaching out to troubleshoot cost/adherence issues.    Diabetes:  Current medications: Basaglar  21 units twice daily in the morning and evening (denies missed doses), Novolog  before each meal 3 times a day per sliding scale (140-199 - 2 units, 200-250 - 4 units, 251-299 - 6 units, 300-349 - 8 units, 350 or above 10 units) (reports that she often does not need to take this with meals but does use TID with scale as directed)  Medications tried in the past: metformin - intolerance, restarted Trulicity  0.75 mg on 11/09/23 then d/c at last hospitalization- has at home still.   Current glucose readings:  Using True Metrix meter - checking 2-3 times daily 03/31/24: 10AM 217, 2PM 223, 6PM 195 - reports she had been drinking soda during the day. Large Dr. Nunzio.  03/30/24: 10AM 141, 4PM 172 03/29/24: 9AM 202, 6PM 161 03/28/24: 9AM 157, 3PM 216, 6PM 158 03/27/24: 8AM 171, 2PM 198, 7PM 121 03/26/24: 8AM 255, 1PM 216, 5PM 174 03/24/24: 10AM 135, 1PM 102, 8PM 132 Denies BG < 70 mg/dL  Previously used Dexcom G7 when she had insurance  Patient denies hypoglycemic s/sx including dizziness, shakiness, sweatiness.. Patient denies hyperglycemic symptoms including nocturia, polyuria, polydipsia, polyphagia, nocturia, blurred vision, neuropathy.  Current meal patterns:  Reports her appetite is low. Eats a sandwich before bedtime (whole wheat bread,  turkey, cheese).  - Drinks: Reports she has been having more regular soda recently - plans to switch back to water  and diet tea.  Current physical activity: did not discuss today  Current medication access support: Enrolled in [facility]. Patient is willing to reapply for Medicaid.  Hypertension:  Current medications: losartan  100 mg daily (reports she still has this at home), amlodipine  5 mg daily Medications previously tried: lisinopril , amlodipine  10 mg and carvedilol  12.5 mg BID were recently discontinued during 11/23/23 hospitalization - she put remaining supply to the side at home.   Home BP - did not recall any readings. Requested that she take losartan  prior to her PCP appt. Will assist in refilling amlodipine  today.  Hyperlipidemia/ASCVD Risk Reduction  Current lipid lowering medications: atorvastatin  10 mg daily (ran out, not taking) Medications tried in the past: simvastatin  Antiplatelet regimen: aspirin  81 mg daily   ASCVD History: none. Normal echo (2018) and stress test (2022) Family History: Heart disease and aneurysm (brain) in Mother Risk Factors: T2DM, HTN  Clinical ASCVD: No  The ASCVD Risk score (Arnett DK, et al., 2019) failed to calculate for the following reasons:   The valid total cholesterol range is 130 to 320 mg/dL    Objective:  BP Readings from Last 3 Encounters:  02/17/24 128/70  02/05/24 (!) 150/86  01/30/24 108/61  Lab Results  Component Value Date   HGBA1C 8.5 (H) 01/29/2024   HGBA1C 8.0 (H) 11/24/2023   HGBA1C 10.7 (A) 10/23/2023    Lab Results  Component Value Date   CREATININE 1.05 (H) 01/29/2024   BUN 22 (H) 01/29/2024   NA 138 01/29/2024   K 3.8 01/29/2024   CL 104 01/29/2024   CO2 23 01/29/2024    Lab Results  Component Value Date   CHOL 111 01/29/2024   HDL 57 01/29/2024   LDLCALC 39 01/29/2024   TRIG 76 01/29/2024   CHOLHDL 1.9 01/29/2024    Medications Reviewed Today     Reviewed by Brinda Lorain SQUIBB, RPH (Pharmacist) on  04/01/24 at 0926  Med List Status: <None>   Medication Order Taking? Sig Documenting Provider Last Dose Status Informant  albuterol  (VENTOLIN  HFA) 108 (90 Base) MCG/ACT inhaler [id number]  Inhale 2 puffs into the lungs every 6 (six) hours as needed for wheezing or shortness of breath. Bacchus, Meade PEDLAR, FNP  Active Multiple Informants           Med Note DONETTE, TOBIAS DEL   Fri Jan 29, 2024  2:30 PM) Last dose was last month  amLODipine  (NORVASC ) 5 MG tablet [id number]  Take 1 tablet (5 mg total) by mouth daily.  Patient not taking: Reported on 04/01/2024   Paseda, Folashade R, FNP  Active   aspirin  EC 81 MG tablet [id number]  Take 81 mg by mouth daily. [provider]  Active Multiple Informants  atorvastatin  (LIPITOR) 10 MG tablet [id number]  Take 1 tablet (10 mg total) by mouth daily.  Patient not taking: Reported on 04/01/2024   Paseda, Folashade R, FNP  Active Multiple Informants  benztropine  (COGENTIN ) 1 MG tablet [id number]  Take 1 tablet (1 mg total) by mouth at bedtime. Paseda, Folashade R, FNP  Active Multiple Informants  benztropine  (COGENTIN ) 1 MG tablet [id number]  Take 1 tablet (1 mg total) by mouth at bedtime.   Active   Blood Glucose Monitoring Suppl (TRUE METRIX METER) w/Device KIT [id number]  Use as directed to monitor blood sugar. Paseda, Folashade R, FNP  Active Multiple Informants  Cholecalciferol (VITAMIN D -3) 125 MCG (5000 UT) TABS [id number]  Take 2 tablets by mouth daily. [name], Gebreselassie W, MD  Active Multiple Informants  DULoxetine  (CYMBALTA ) 60 MG capsule [id number]  Take 1 capsule (60 mg total) by mouth 2 (two) times daily. Paseda, Folashade R, FNP  Active Multiple Informants  DULoxetine  (CYMBALTA ) 60 MG capsule [id number]  Take 1 capsule (60 mg total) by mouth 2 (two) times daily.   Active   ferrous sulfate  325 (65 FE) MG tablet [id number]  Take 325 mg by mouth daily with breakfast. [provider]  Active Multiple Informants  gabapentin  (NEURONTIN ) 300 MG capsule  [id number]  Take 1 capsule (300 mg total) by mouth daily. Paseda, Folashade R, FNP  Active Multiple Informants  Glucagon  (GVOKE HYPOPEN  2-PACK) 1 MG/0.2ML SOAJ [id number]  Inject 1 mg into the skin daily as needed. Paseda, Folashade R, FNP  Active Multiple Informants  glucose blood (TRUE METRIX BLOOD GLUCOSE TEST) test strip [id number]  Use as instructed to monitor blood sugar twice daily Paseda, Folashade R, FNP  Active Multiple Informants  Insulin  Glargine (BASAGLAR  KWIKPEN) 100 UNIT/ML [id number] Yes Inject 21 Units into the skin 2 (two) times daily. Paseda, Folashade R, FNP  Active   insulin  lispro (HUMALOG ) 100 UNIT/ML KwikPen [id number] Yes Take before each meal 3 times a day, 140-199 - 2 units, 200-250 -  4 units, 251-299 - 6 units, 300-349 - 8 units, 350 or above 10 units Paseda, Folashade R, FNP  Active Multiple Informants  Insulin  Pen Needle (PEN NEEDLES) 31G X 6 MM MISC [id number]  For 4 times a day insulin , 1 month supply. Diagnosis E11.65 Paseda, Folashade R, FNP  Active Multiple Informants  levothyroxine  (SYNTHROID ) 75 MCG tablet [id number] Yes Take 1 tablet (75 mcg total) by mouth daily before breakfast. Paseda, Folashade R, FNP  Active Multiple Informants  lithium  carbonate (LITHOBID ) 300 MG ER tablet [id number]  Take 1 tablet (300 mg total) by mouth daily. Lera Nancyann NOVAK, DO  Active   lithium  carbonate 300 MG capsule [id number]  Take 1 capsule (300 mg total) by mouth daily.   Active   losartan  (COZAAR ) 100 MG tablet [id number] Yes Take 1 tablet (100 mg total) by mouth daily. Paseda, Folashade R, FNP  Active Multiple Informants  Multiple Vitamin (MULTIVITAMIN) tablet [id number]  Take 1 tablet by mouth daily. [provider]  Active Multiple Informants  risperiDONE  (RISPERDAL ) 3 MG tablet [id number] Yes Take 2 tablets (6 mg total) by mouth at bedtime.   Active   rizatriptan  (MAXALT ) 10 MG tablet [id number]  Take 1 tablet (10 mg total) by mouth as needed for migraine. May repeat in 2 hours if  needed Paseda, Folashade R, FNP  Active Multiple Informants           Med Note DONETTE, TOBIAS DEL   Fri Jan 29, 2024  2:34 PM) Last dose was 2 months ago  traZODone  (DESYREL ) 50 MG tablet [id number] Yes Take 1 tablet (50 mg total) by mouth at bedtime as needed for sleep. Lera Nancyann NOVAK, DO  Active   traZODone  (DESYREL ) 50 MG tablet [id number]  Take 1 tablet (50 mg total) by mouth at bedtime.   Active   TRUEplus Lancets 30G MISC [id number]  Use as directed to monitor blood sugar twice daily Paseda, Folashade R, FNP  Active Multiple Informants              Assessment/Plan:   Diabetes: - Currently uncontrolled with last A1C of 8.5% above goal < 7%, and increased from 8.0% two months prior. Patient reports good adherence to insulin  and is checking her BG regularly. Glycemic control appears worsened compared to last pharmacy visit, which patient attributes to dietary indiscretion (more soda intake). Focused on diet changes today, as I am hesitant to increase insulin  with hx of hospitalization for hypoglycemia. Eventually, would like to transition patient back to GLP-1RA Trulicity , though need to monitor for diety suppression. Patient would greatly benefit from CGM - encouraged her to reapply for Medicaid today - Reviewed long term cardiovascular and renal outcomes of uncontrolled blood sugar - Reviewed goal A1c, goal fasting, and goal 2 hour post prandial glucose - Reviewed dietary modifications including reducing portion size of carbohydrate foods, increasing protein, increasing intake of non-starchy vegetables. Discussed avoiding soda and sugary beverages and increasing water  intake - Reviewed lifestyle modifications including: increasing physical activity - Recommend to contniue Basaglar  21 units twice daily. Patient aware to notify clinic if she is having hypoglycemia with low BG < 70 mg/dL - Recommend to continue Humalog  3 times daily before meals per sliding scale (140-199 - 2 units, 200-250 - 4  units, 251-299 - 6 units, 300-349 - 8 units, 350 or above 10 units) - Patient denies personal or family history of multiple endocrine neoplasia type 2, medullary thyroid  cancer; personal history of pancreatitis or gallbladder disease. - Recommend to check  glucose TID before meals (True Metrix meter and supplies for affordability) - Enrolled in [facility] program at [facility] through June 2026.     Hypertension: - Currently unable to assess control without home readings. BP in clinic today will reflect efficacy of monotherapy with losartan  100 mg daily, as she has no recent fills for amlodipine .  - Reviewed long term cardiovascular and renal outcomes of uncontrolled blood pressure - Reviewed appropriate blood pressure monitoring technique and reviewed goal blood pressure. Recommended to check home blood pressure and heart rate once daily - Recommend to continue losartan  100 mg daily and resume amlodipine  5 mg daily    Hyperlipidemia/ASCVD Risk Reduction: - Currently controlled with LDL-C of 39 mg/dL below goal < 70 mg/dL on atorvastatin  10 mg daily, though appears she is not currently adherent to atorvastatin . Recommend to continue/resume moderate intensity statin.  - Reviewed long term complications of uncontrolled cholesterol - Recommend to continue atorvastatin  10 mg daily. Collaborated with pharmacy today to refill.    Follow Up Plan:  Pharmacist telephone 05/06/24, PCP in person ~ Dec 2025 for A1C   Lorain Baseman, PharmD [facility] [phone]

## 2024-04-01 NOTE — Assessment & Plan Note (Signed)
 Followed by psychiatrist Continue current medications and maintain close follow-up with psychiatrist

## 2024-04-01 NOTE — Assessment & Plan Note (Addendum)
 Last vitamin D  Lab Results  Component Value Date   VD25OH 25.0 (L) 07/24/2023  Stated that she is Taking 10,000 units daily? Checking vitamin D  level

## 2024-04-02 LAB — VITAMIN D 25 HYDROXY (VIT D DEFICIENCY, FRACTURES): Vit D, 25-Hydroxy: 31 ng/mL (ref 30.0–100.0)

## 2024-04-02 LAB — TSH+FREE T4
Free T4: 1.46 ng/dL (ref 0.82–1.77)
TSH: 0.466 u[IU]/mL (ref 0.450–4.500)

## 2024-04-04 ENCOUNTER — Other Ambulatory Visit: Payer: Self-pay | Admitting: Nurse Practitioner

## 2024-04-04 ENCOUNTER — Ambulatory Visit: Payer: Self-pay | Admitting: Nurse Practitioner

## 2024-04-04 ENCOUNTER — Other Ambulatory Visit: Payer: Self-pay

## 2024-04-04 DIAGNOSIS — F259 Schizoaffective disorder, unspecified: Secondary | ICD-10-CM

## 2024-04-04 DIAGNOSIS — E039 Hypothyroidism, unspecified: Secondary | ICD-10-CM

## 2024-04-04 MED ORDER — LEVOTHYROXINE SODIUM 75 MCG PO TABS
75.0000 ug | ORAL_TABLET | Freq: Every day | ORAL | 1 refills | Status: AC
  Filled 2024-04-04 – 2024-06-16 (×2): qty 90, 90d supply, fill #0

## 2024-04-04 MED ORDER — LEVOTHYROXINE SODIUM 75 MCG PO TABS
75.0000 ug | ORAL_TABLET | Freq: Every day | ORAL | 1 refills | Status: DC
Start: 2024-04-04 — End: 2024-04-04
  Filled 2024-04-04: qty 90, 90d supply, fill #0

## 2024-04-05 NOTE — Telephone Encounter (Signed)
 Please advise [facility]

## 2024-04-11 ENCOUNTER — Other Ambulatory Visit: Payer: Self-pay

## 2024-04-12 ENCOUNTER — Other Ambulatory Visit: Payer: Self-pay

## 2024-04-14 ENCOUNTER — Other Ambulatory Visit: Payer: Self-pay

## 2024-04-15 ENCOUNTER — Other Ambulatory Visit: Payer: Self-pay

## 2024-04-17 ENCOUNTER — Other Ambulatory Visit: Payer: Self-pay

## 2024-04-17 ENCOUNTER — Emergency Department (HOSPITAL_COMMUNITY)
Admission: EM | Admit: 2024-04-17 | Discharge: 2024-04-17 | Disposition: A | Discharge status: Home / Self Care | Payer: Self-pay | Attending: Emergency Medicine | Admitting: Emergency Medicine

## 2024-04-17 ENCOUNTER — Emergency Department (HOSPITAL_COMMUNITY): Payer: Self-pay

## 2024-04-17 ENCOUNTER — Encounter (HOSPITAL_COMMUNITY): Payer: Self-pay | Admitting: *Deleted

## 2024-04-17 DIAGNOSIS — M545 Low back pain, unspecified: Secondary | ICD-10-CM | POA: Insufficient documentation

## 2024-04-17 DIAGNOSIS — R109 Unspecified abdominal pain: Secondary | ICD-10-CM

## 2024-04-17 DIAGNOSIS — R0602 Shortness of breath: Secondary | ICD-10-CM | POA: Insufficient documentation

## 2024-04-17 DIAGNOSIS — Z794 Long term (current) use of insulin: Secondary | ICD-10-CM | POA: Insufficient documentation

## 2024-04-17 DIAGNOSIS — Z7982 Long term (current) use of aspirin: Secondary | ICD-10-CM | POA: Insufficient documentation

## 2024-04-17 DIAGNOSIS — K5900 Constipation, unspecified: Secondary | ICD-10-CM | POA: Insufficient documentation

## 2024-04-17 LAB — CBC
HCT: 33.3 % — ABNORMAL LOW (ref 36.0–46.0)
Hemoglobin: 11.1 g/dL — ABNORMAL LOW (ref 12.0–15.0)
MCH: 28.9 pg (ref 26.0–34.0)
MCHC: 33.3 g/dL (ref 30.0–36.0)
MCV: 86.7 fL (ref 80.0–100.0)
Platelets: 322 K/uL (ref 150–400)
RBC: 3.84 MIL/uL — ABNORMAL LOW (ref 3.87–5.11)
RDW: 12.7 % (ref 11.5–15.5)
WBC: 8.2 K/uL (ref 4.0–10.5)
nRBC: 0 % (ref 0.0–0.2)

## 2024-04-17 LAB — URINALYSIS, ROUTINE W REFLEX MICROSCOPIC
Bilirubin Urine: NEGATIVE
Glucose, UA: >=500 mg/dL — AB
Hgb urine dipstick: NEGATIVE
Ketones, ur: NEGATIVE mg/dL
Nitrite: NEGATIVE
Protein, ur: NEGATIVE mg/dL
Specific Gravity, Urine: 1.01 (ref 1.005–1.030)
pH: 5 (ref 5.0–8.0)

## 2024-04-17 LAB — COMPREHENSIVE METABOLIC PANEL WITH GFR
ALT: 26 U/L (ref 0–44)
AST: 26 U/L (ref 15–41)
Albumin: 4.8 g/dL (ref 3.5–5.0)
Alkaline Phosphatase: 84 U/L (ref 38–126)
Anion gap: 10 (ref 5–15)
BUN: 12 mg/dL (ref 6–20)
CO2: 28 mmol/L (ref 22–32)
Calcium: 9.3 mg/dL (ref 8.9–10.3)
Chloride: 101 mmol/L (ref 98–111)
Creatinine, Ser: 0.88 mg/dL (ref 0.44–1.00)
GFR, Estimated: >60 mL/min (ref >60)
Glucose, Bld: 293 mg/dL — ABNORMAL HIGH (ref 70–99)
Potassium: 3.8 mmol/L (ref 3.5–5.1)
Sodium: 139 mmol/L (ref 135–145)
Total Bilirubin: 0.7 mg/dL (ref 0.0–1.2)
Total Protein: 7.9 g/dL (ref 6.5–8.1)

## 2024-04-17 LAB — PRO BRAIN NATRIURETIC PEPTIDE: Pro Brain Natriuretic Peptide: <50 pg/mL (ref <300.0)

## 2024-04-17 LAB — LIPASE, BLOOD: Lipase: 11 U/L (ref 11–51)

## 2024-04-17 LAB — D-DIMER, QUANTITATIVE: D-Dimer, Quant: 0.33 ug{FEU}/mL (ref 0.00–0.50)

## 2024-04-17 MED ORDER — ONDANSETRON 4 MG PO TBDP
4.0000 mg | ORAL_TABLET | Freq: Once | ORAL | Status: AC
  Administered 2024-04-17: 4 mg via ORAL
  Filled 2024-04-17: qty 1

## 2024-04-17 MED ORDER — IOHEXOL 300 MG/ML  SOLN
100.0000 mL | Freq: Once | INTRAMUSCULAR | Status: AC | PRN
  Administered 2024-04-17: 100 mL via INTRAVENOUS

## 2024-04-17 NOTE — Discharge Instructions (Addendum)
 Thank you for visiting the Emergency Department today. It was a pleasure to be part of your healthcare team.  Your test results showed no acute findings. If you have any questions about your medicines, please call your pharmacy or healthcare provider. At home, rest, hydrate, resume normal diet, and take all home medications as directed.  As discussed, it is important to watch for warning signs such as worsening pain, fever, trouble breathing, or chest pain. If any of these happen, return to the Emergency Department or call 911. Thank you for trusting us  with your health.

## 2024-04-17 NOTE — ED Notes (Signed)
 ED Provider at bedside.

## 2024-04-17 NOTE — ED Provider Notes (Signed)
 [place] EMERGENCY DEPARTMENT AT [facility] Provider Note   CSN: [id number] Arrival date & time: 04/17/24  1237     Patient presents with: Abdominal Pain   Kathryn [name] is a 53 y.o. female who presents with a chief complaint of abdominal pain and low back pain x 1 week.  Patient states that there is no instigating event for the abdominal pain and low back pain that started 1 week ago, however it has gradually gotten worse.  Patient states that she has associated nausea and shortness of breath.  Patient denies any chest pain, fevers, vomiting, diarrhea. Patient endorses wax/wane constipation.  Patient in no acute distress at the time of exam.   Abdominal Pain      Prior to Admission medications   Medication Sig Start Date End Date Taking? Authorizing Provider  albuterol  (VENTOLIN  HFA) 108 (90 Base) MCG/ACT inhaler Inhale 2 puffs into the lungs every 6 (six) hours as needed for wheezing or shortness of breath. 09/16/22   Bacchus, Meade PEDLAR, FNP  amLODipine  (NORVASC ) 5 MG tablet Take 1 tablet (5 mg total) by mouth daily. 04/01/24   Paseda, Folashade R, FNP  aspirin  EC 81 MG tablet Take 81 mg by mouth daily.    [provider]  atorvastatin  (LIPITOR) 10 MG tablet Take 1 tablet (10 mg total) by mouth daily. 11/06/23   Paseda, Folashade R, FNP  benztropine  (COGENTIN ) 1 MG tablet Take 1 tablet (1 mg total) by mouth at bedtime. 12/07/23   Paseda, Folashade R, FNP  benztropine  (COGENTIN ) 1 MG tablet Take 1 tablet (1 mg total) by mouth at bedtime. 02/17/24     Blood Glucose Monitoring Suppl (TRUE METRIX METER) w/Device KIT Use as directed to monitor blood sugar. 11/06/23   Paseda, Folashade R, FNP  Cholecalciferol (VITAMIN D -3) 125 MCG (5000 UT) TABS Take 2 tablets by mouth daily. 10/13/19   [name], Gebreselassie W, MD  DULoxetine  (CYMBALTA ) 60 MG capsule Take 1 capsule (60 mg total) by mouth 2 (two) times daily. 11/06/23   Paseda, Folashade R, FNP  ferrous sulfate  325 (65 FE) MG  tablet Take 325 mg by mouth daily with breakfast.    [provider]  gabapentin  (NEURONTIN ) 300 MG capsule Take 1 capsule (300 mg total) by mouth daily. 12/07/23 06/04/24  Paseda, Folashade R, FNP  Glucagon  (GVOKE HYPOPEN  2-PACK) 1 MG/0.2ML SOAJ Inject 1 mg into the skin daily as needed. 12/11/23   Paseda, Folashade R, FNP  glucose blood (TRUE METRIX BLOOD GLUCOSE TEST) test strip Use as instructed to monitor blood sugar twice daily 11/06/23   Paseda, Folashade R, FNP  Insulin  Glargine (BASAGLAR  KWIKPEN) 100 UNIT/ML Inject 21 Units into the skin 2 (two) times daily. 02/17/24   Paseda, Folashade R, FNP  insulin  lispro (HUMALOG ) 100 UNIT/ML KwikPen Take before each meal 3 times a day, 140-199 - 2 units, 200-250 - 4 units, 251-299 - 6 units, 300-349 - 8 units, 350 or above 10 units 01/22/24   Paseda, Folashade R, FNP  Insulin  Pen Needle (PEN NEEDLES) 31G X 6 MM MISC For 4 times a day insulin , 1 month supply. Diagnosis E11.65 01/22/24   Paseda, Folashade R, FNP  levothyroxine  (SYNTHROID ) 75 MCG tablet Take 1 tablet (75 mcg total) by mouth daily before breakfast. 04/04/24   Paseda, Folashade R, FNP  lithium  carbonate (LITHOBID ) 300 MG ER tablet Take 1 tablet (300 mg total) by mouth daily. 02/06/24   Lera Nancyann NOVAK, DO  lithium  carbonate 300 MG capsule Take  1 capsule (300 mg total) by mouth daily. 02/17/24     losartan  (COZAAR ) 100 MG tablet Take 1 tablet (100 mg total) by mouth daily. 12/04/23   Paseda, Folashade R, FNP  Multiple Vitamin (MULTIVITAMIN) tablet Take 1 tablet by mouth daily.    [provider]  risperiDONE  (RISPERDAL ) 3 MG tablet Take 2 tablets (6 mg total) by mouth at bedtime. 02/17/24     rizatriptan  (MAXALT ) 10 MG tablet Take 1 tablet (10 mg total) by mouth as needed for migraine. May repeat in 2 hours if needed 10/23/23   Paseda, Folashade R, FNP  traZODone  (DESYREL ) 50 MG tablet Take 1 tablet (50 mg total) by mouth at bedtime as needed for sleep. 02/05/24   Lera Nancyann NOVAK, DO   TRUEplus Lancets 30G MISC Use as directed to monitor blood sugar twice daily 11/06/23   Paseda, Folashade R, FNP    Allergies: Metformin hcl    Review of Systems  Gastrointestinal:  Positive for abdominal pain.    Updated Vital Signs BP (!) 147/86   Pulse 93   Temp (!) 97.5 F (36.4 C) (Oral)   Resp 13   Ht 5' (1.524 m)   Wt 76.2 kg   LMP 01/23/2013   SpO2 98%   BMI 32.81 kg/m   Physical Exam Vitals and nursing note reviewed.  Constitutional:      General: She is not in acute distress.    Appearance: Normal appearance.  HENT:     Head: Normocephalic and atraumatic.  Eyes:     Extraocular Movements: Extraocular movements intact.     Conjunctiva/sclera: Conjunctivae normal.     Pupils: Pupils are equal, round, and reactive to light.  Cardiovascular:     Rate and Rhythm: Normal rate and regular rhythm.     Pulses: Normal pulses.  Pulmonary:     Effort: Pulmonary effort is normal. No respiratory distress.  Abdominal:     General: Abdomen is flat. There is distension.     Palpations: Abdomen is soft.     Tenderness: There is abdominal tenderness.  Musculoskeletal:        General: Normal range of motion.     Cervical back: Normal range of motion.     Right lower leg: No edema.     Left lower leg: No edema.  Skin:    General: Skin is warm and dry.     Capillary Refill: Capillary refill takes less than 2 seconds.  Neurological:     General: No focal deficit present.     Mental Status: She is alert. Mental status is at baseline.  Psychiatric:        Mood and Affect: Mood normal.     (all labs ordered are listed, but only abnormal results are displayed) Labs Reviewed  COMPREHENSIVE METABOLIC PANEL WITH GFR - Abnormal; Notable for the following components:      Result Value   Glucose, Bld 293 (*)    All other components within normal limits  CBC - Abnormal; Notable for the following components:   RBC 3.84 (*)    Hemoglobin 11.1 (*)    HCT 33.3 (*)    All  other components within normal limits  URINALYSIS, ROUTINE W REFLEX MICROSCOPIC - Abnormal; Notable for the following components:   Glucose, UA >=500 (*)    Leukocytes,Ua TRACE (*)    Bacteria, UA RARE (*)    All other components within normal limits  LIPASE, BLOOD  D-DIMER, QUANTITATIVE  PRO BRAIN NATRIURETIC  PEPTIDE    EKG: EKG Interpretation Date/Time:  Sunday April 17 2024 14:06:40 EST Ventricular Rate:  86 PR Interval:  139 QRS Duration:  74 QT Interval:  371 QTC Calculation: 444 R Axis:   -24  Text Interpretation: Sinus rhythm Borderline left axis deviation Low voltage, precordial leads Consider anterior infarct since last tracing no significant change Confirmed by Cleotilde Rogue ([id number]) on 04/17/2024 2:10:53 PM  Radiology: CT ABDOMEN PELVIS W CONTRAST Result Date: 04/17/2024 CLINICAL DATA:  Abdominal pain. EXAM: CT ABDOMEN AND PELVIS WITH CONTRAST TECHNIQUE: Multidetector CT imaging of the abdomen and pelvis was performed using the standard protocol following bolus administration of intravenous contrast. RADIATION DOSE REDUCTION: This exam was performed according to the departmental dose-optimization program which includes automated exposure control, adjustment of the mA and/or kV according to patient size and/or use of iterative reconstruction technique. CONTRAST:  OMNIPAQUE  IOHEXOL  300 MG/ML  SOLN COMPARISON:  CT abdomen pelvis dated 09/06/2015. FINDINGS: Lower chest: The visualized lung bases are clear. No intra-abdominal free air or free fluid. Hepatobiliary: The liver is unremarkable. No biliary dilatation. Cholecystectomy. No retained calcified stone noted in the central CBD. Pancreas: Unremarkable. No pancreatic ductal dilatation or surrounding inflammatory changes. Spleen: Normal in size without focal abnormality. Adrenals/Urinary Tract: The adrenal glands are unremarkable. There is no hydronephrosis on either side. There is symmetric enhancement and excretion of  contrast by both kidneys. The visualized ureters and urinary bladder appear unremarkable. Stomach/Bowel: Moderate stool throughout the colon. Normal caliber fecalized small bowel loops represent increased transit time or small intestinal bacterial overgrowth. No bowel obstruction or active inflammation. The appendix is normal. Vascular/Lymphatic: The abdominal aorta and IVC unremarkable. No portal venous gas. There is no adenopathy. Reproductive: The uterus and ovaries are grossly unremarkable Other: None Musculoskeletal: Degenerative changes of the lower lumbar spine. No acute osseous pathology. IMPRESSION: 1. No acute intra-abdominal or pelvic pathology. 2. Moderate colonic stool burden. No bowel obstruction. Normal appendix. Electronically Signed   By: Vanetta Chou M.D.   On: 04/17/2024 14:58     Procedures   Medications Ordered in the ED  iohexol  (OMNIPAQUE ) 300 MG/ML solution 100 mL (100 mLs Intravenous Contrast Given 04/17/24 1429)  ondansetron  (ZOFRAN -ODT) disintegrating tablet 4 mg (4 mg Oral Given 04/17/24 1617)                                    Medical Decision Making Amount and/or Complexity of Data Reviewed Labs: ordered.   Patient presents to the ED for concern of abdominal pain, this involves an extensive number of treatment options, and is a complaint that carries with it a high risk of complications and morbidity.    The differential diagnosis includes: IBS etiology Infectious etiology Minor MSK etiology  Co morbidities that complicate the patient evaluation: Chronic constipation  Lab Tests: I ordered, and personally interpreted labs.   The pertinent results include:  Urinalysis Lipase CMP CBC D-dimer BNP  Imaging Studies ordered: I ordered imaging studies including: CT abdomen pelvis with contrast I independently visualized and interpreted imaging which showed: Moderate colonic stool burden.  No bowel obstruction.  Normal appendix. I agree with the  radiologist interpretation  Cardiac Monitoring: The patient was maintained on a cardiac monitor.  I personally viewed and interpreted the cardiac monitored which showed an underlying rhythm of: Normal sinus rhythm  Medicines ordered and prescription drug management: I ordered medications: Zofran  ODT 4 mg Reevaluation of  the patient after these medicines showed that the patient improved I have reviewed the patients home medicines and have made adjustments as needed  Problem List / ED Course: Problem List: Abdominal pain Lower back pain Emergency Department Course: The patient presented with abdominal pain. Initial assessment included history, physical exam, and review of prior medical records. Given her abdominal distention and patient's reported wax/wane shortness of breath, a BNP, D-dimer, and EKG were obtained and unremarkable, lowering concern for cardiogenic or thromboembolic etiology.  Comprehensive laboratory studies were also obtained and were within normal limits, showing no evidence of infection or metabolic abnormalities. CT abdomen pelvis demonstrated moderate colonic stool burden, consistent with constipation and correlating with the patient's chronic symptoms.  No signs of obstruction, perforation, inflammatory process, or acute surgical pathology.  Patient remained comfortable throughout her ED stay.  She was given Zofran , and completed a p.o. challenge successfully, no signs of clinical deterioration.  Symptoms were consistent with constipation related abdominal discomfort.  Reevaluation: After the interventions noted above, I reevaluated the patient and found that they have :improved   Dispostion: Given stable vital signs, reassuring diagnostic workup, and her ability to tolerate oral intake, patient was discharged home with supportive care recommendations.  She reports having Linzess  and MiraLAX  at home, which she plans to resume for constipation management.      Final  diagnoses:  Abdominal pain, unspecified abdominal location  Constipation, unspecified constipation type    ED Discharge Orders     None          Willma Duwaine CROME, [place] 04/18/24 [id number]    Cleotilde Rogue, MD 04/18/24 [phone]

## 2024-04-17 NOTE — ED Triage Notes (Signed)
 Pt with abd pain and back pain with nausea for past few days. Denies any emesis or diarrhea.

## 2024-04-20 ENCOUNTER — Ambulatory Visit: Payer: Self-pay | Admitting: Orthopedic Surgery

## 2024-04-22 ENCOUNTER — Ambulatory Visit (INDEPENDENT_AMBULATORY_CARE_PROVIDER_SITE_OTHER): Admitting: Audiology

## 2024-04-22 ENCOUNTER — Institutional Professional Consult (permissible substitution) (INDEPENDENT_AMBULATORY_CARE_PROVIDER_SITE_OTHER): Admitting: Otolaryngology

## 2024-04-25 ENCOUNTER — Encounter: Payer: Self-pay | Admitting: Nurse Practitioner

## 2024-04-25 ENCOUNTER — Ambulatory Visit (INDEPENDENT_AMBULATORY_CARE_PROVIDER_SITE_OTHER): Payer: Self-pay | Admitting: Nurse Practitioner

## 2024-04-25 ENCOUNTER — Other Ambulatory Visit: Payer: Self-pay

## 2024-04-25 VITALS — BP 115/67 | HR 99 | Wt 165.0 lb

## 2024-04-25 DIAGNOSIS — R109 Unspecified abdominal pain: Secondary | ICD-10-CM

## 2024-04-25 DIAGNOSIS — E1165 Type 2 diabetes mellitus with hyperglycemia: Secondary | ICD-10-CM

## 2024-04-25 DIAGNOSIS — M79661 Pain in right lower leg: Secondary | ICD-10-CM | POA: Diagnosis not present

## 2024-04-25 DIAGNOSIS — K581 Irritable bowel syndrome with constipation: Secondary | ICD-10-CM

## 2024-04-25 DIAGNOSIS — Z794 Long term (current) use of insulin: Secondary | ICD-10-CM | POA: Diagnosis not present

## 2024-04-25 DIAGNOSIS — K5900 Constipation, unspecified: Secondary | ICD-10-CM

## 2024-04-25 DIAGNOSIS — M79662 Pain in left lower leg: Secondary | ICD-10-CM | POA: Diagnosis not present

## 2024-04-25 DIAGNOSIS — M5416 Radiculopathy, lumbar region: Secondary | ICD-10-CM | POA: Diagnosis not present

## 2024-04-25 DIAGNOSIS — G43009 Migraine without aura, not intractable, without status migrainosus: Secondary | ICD-10-CM | POA: Diagnosis not present

## 2024-04-25 DIAGNOSIS — Z09 Encounter for follow-up examination after completed treatment for conditions other than malignant neoplasm: Secondary | ICD-10-CM | POA: Diagnosis not present

## 2024-04-25 MED ORDER — RIZATRIPTAN BENZOATE 10 MG PO TABS
10.0000 mg | ORAL_TABLET | ORAL | 2 refills | Status: AC | PRN
  Filled 2024-04-25: qty 10, 30d supply, fill #0
  Filled 2024-04-29: qty 10, 5d supply, fill #0

## 2024-04-25 MED ORDER — LINACLOTIDE 290 MCG PO CAPS
290.0000 ug | ORAL_CAPSULE | Freq: Every day | ORAL | 1 refills | Status: AC
  Filled 2024-04-25 – 2024-04-29 (×2): qty 90, 90d supply, fill #0

## 2024-04-25 NOTE — Assessment & Plan Note (Signed)
 Hospital chart reviewed, including discharge summary Medications reconciled and reviewed with the patient in detail

## 2024-04-25 NOTE — Assessment & Plan Note (Signed)
 Linzess  290 mcg daily refilled

## 2024-04-25 NOTE — Assessment & Plan Note (Addendum)
 Recent abdominal CT showed no acute intra-abdominal or pelvic pathology. 2. Moderate colonic stool burden. No bowel obstruction. Normal appendix. Continue Linzess  290 mcg daily, stool softener as needed, laxative as needed increase intake of fiber Due for colonoscopy, last one was in 2022 with recommendation to repeat every 3 years, she is working on getting a manufacturing engineer and then we will Follow-up with GI

## 2024-04-25 NOTE — Assessment & Plan Note (Signed)
 Lab Results  Component Value Date   HGBA1C 8.5 (H) 01/29/2024    Uncontrolled blood glucose with dietary indiscretions. Elevated morning glucose levels, variable postprandial levels. A1c pending, goal <7%. - Continue Lantus  21 units twice daily. - Continue sliding scale insulin  regimen. - Avoid sugary drinks, including ginger ale. - Monitor blood glucose levels regularly. - Review A1c results when available.

## 2024-04-25 NOTE — Assessment & Plan Note (Addendum)
 Chronic constipation has history of IBS Persistent abdominal  pain with significant stool burden on CT. Linzess  provided minimal relief. - Continue Linzess  290 mcg daily. - Use stool softener as needed. - Try laxative powder as recommended by pharmacist. - Increase dietary fiber intake, including chia seeds soaked overnight. - Ensure adequate hydration.

## 2024-04-25 NOTE — Progress Notes (Signed)
 Established Patient Office Visit  Subjective:  Patient ID: Kathryn [name], female    DOB: [birth year]  Age: 53 y.o. MRN: [id number]  CC:  Chief Complaint  Patient presents with   Hospitalization Follow-up    HPI   Discussed the use of AI scribe software for clinical note transcription with the patient, who gave verbal consent to proceed.  History of Present Illness Kathryn [name] is a 53 year old female  has a past medical history of Anemia, Anxiety, Asthma, Depression, GERD (gastroesophageal reflux disease), HLD (hyperlipidemia) (04/07/2019), Hypertension, Hypothyroidism, adult (04/07/2019), Neuropathy, Obesity (BMI 30.0-34.9) (04/07/2019), Schizoaffective disorder (HCC), and Type II diabetes mellitus, uncontrolled (04/27/2019).  who presents with persistent abdominal and back pain.  She has been experiencing persistent abdominal and back pain, which led to a visit to the emergency room on April 17, 2024. A CT scan of the abdomen revealed significant stool accumulation. Despite taking Linzess  290 mcg daily, it has only been effective once in promoting bowel movement. She has also been using a stool softener and has not yet used the stronger powdered laxative she obtained from the pharmacy.  She has a history of diabetes and is currently taking Lantus  insulin  at 21 units twice daily, along with a sliding scale regimen. She consumes regular ginger ale, which has affected her blood sugar levels. Her fasting blood sugar readings have been elevated, sometimes reaching 170 mg/dL.  Chronic low back pain .her psychiatric medications have been adjusted, with Cymbalta  being discontinued.  Stated that the psychiatrist told her to stop taking Tylenol  ,she continues to take gabapentin  300 mg at bedtime and  She also uses a heating pad for relief.  She is currently unemployed and is in the process of obtaining full Medicaid coverage, which has delayed her follow-up with a  gastroenterologist.  Assessment & Plan      Past Medical History:  Diagnosis Date   Anemia    Anxiety    Asthma    Depression    GERD (gastroesophageal reflux disease)    HLD (hyperlipidemia) 04/07/2019   Hypertension    Hypothyroidism, adult 04/07/2019   Neuropathy    Obesity (BMI 30.0-34.9) 04/07/2019   Schizoaffective disorder (HCC)    Type II diabetes mellitus, uncontrolled 04/27/2019    Past Surgical History:  Procedure Laterality Date   BACK SURGERY  09/06/2020   BIOPSY  05/03/2021   Procedure: BIOPSY;  Surgeon: Eartha Angelia Sieving, MD;  Location: [facility];  Service: Gastroenterology;;   BREAST SURGERY Right    biopsy   CESAREAN SECTION     CHOLECYSTECTOMY  06/02/2012   Procedure: LAPAROSCOPIC CHOLECYSTECTOMY;  Surgeon: Oneil DELENA Budge, MD;  Location: [facility];  Service: General;  Laterality: N/A;  Attempted laparoscopic cholecystectomy   CHOLECYSTECTOMY  06/02/2012   Procedure: CHOLECYSTECTOMY;  Surgeon: Oneil DELENA Budge, MD;  Location: [facility];  Service: General;  Laterality: N/A;  converted to open at  9094   COLONOSCOPY WITH PROPOFOL  N/A 07/18/2020   prep was fair, one 4mm polyp (inflammatory) stool in descending colon, transverse and ascending, distal rectum and anal verge normal   ESOPHAGEAL DILATION  12/31/2018   Procedure: ESOPHAGEAL DILATION;  Surgeon: Golda Claudis PENNER, MD;  Location: [facility];  Service: Endoscopy;;   ESOPHAGOGASTRODUODENOSCOPY (EGD) WITH PROPOFOL  N/A 08/24/2015   Procedure: ESOPHAGOGASTRODUODENOSCOPY (EGD) WITH PROPOFOL ;  Surgeon: Claudis PENNER Golda, MD;  Location: [facility];  Service: Endoscopy;  Laterality: N/A;  1:10 - Ann to notify pt to arrive at  11:30   ESOPHAGOGASTRODUODENOSCOPY (EGD) WITH PROPOFOL  N/A 12/31/2018   normal, no abnormality to explain dysphagia, esophagus dilated.   ESOPHAGOGASTRODUODENOSCOPY (EGD) WITH PROPOFOL  N/A 05/03/2021   Procedure: ESOPHAGOGASTRODUODENOSCOPY (EGD) WITH PROPOFOL ;  Surgeon: Eartha Angelia Sieving, MD;  Location: [facility];  Service: Gastroenterology;  Laterality: N/A;  1:20   FLEXIBLE SIGMOIDOSCOPY  07/17/2020   Procedure: FLEXIBLE SIGMOIDOSCOPY;  Surgeon: Eartha Angelia, Sieving, MD;  Location: [facility];  Service: Gastroenterology;;   POLYPECTOMY  07/18/2020   Procedure: POLYPECTOMY INTESTINAL;  Surgeon: Eartha Angelia, Sieving, MD;  Location: [facility];  Service: Gastroenterology;;  ascending colon polyp;    SAVORY DILATION  05/03/2021   Procedure: SAVORY DILATION;  Surgeon: Eartha Angelia Sieving, MD;  Location: [facility];  Service: Gastroenterology;;   tooth removal  2019   all teeth removed   TUBAL LIGATION     X3    Family History  Problem Relation Age of Onset   Hypertension Mother    Alcohol abuse Mother    Heart disease Mother    Kidney disease Mother    Aneurysm Mother        brain   Hypertension Father    Alcohol abuse Sister    Alcohol abuse Maternal Aunt    Cancer Maternal Aunt    Alcohol abuse Paternal Aunt    Alcohol abuse Maternal Grandmother    Alcohol abuse Maternal Grandfather    Hearing loss Daughter    Alcohol abuse Cousin    Stroke Other    Diabetes Other    Cancer Other    Seizures Other     Social History   Socioeconomic History   Marital status: Married    Spouse name: [name]   Number of children: 3   Years of education: 12   Highest education level: Not on file  Occupational History    Comment: [employer]    Comment: 3rd shift  Tobacco Use   Smoking status: Never    Passive exposure: Never   Smokeless tobacco: Never  Vaping Use   Vaping status: Never Used  Substance and Sexual Activity   Alcohol use: No    Alcohol/week: 0.0 standard drinks of alcohol   Drug use: No   Sexual activity: Not Currently    Birth control/protection: Surgical  Other Topics Concern   Not on file  Social History Narrative   Married for 22 years.On disability secondary to schizophrenia.   Lives with husband.    Caffeine- decaf coffee, tea   Social Drivers of Corporate Investment Banker Strain: Not on file  Food Insecurity: Food Insecurity Present (01/30/2024)   Hunger Vital Sign    Worried About Running Out of Food in the Last Year: Sometimes true    Ran Out of Food in the Last Year: Sometimes true  Transportation Needs: No Transportation Needs (01/30/2024)   PRAPARE - Administrator, Civil Service (Medical): No    Lack of Transportation (Non-Medical): No  Physical Activity: Not on file  Stress: Not on file  Social Connections: Socially Isolated (01/30/2024)   Social Connection and Isolation Panel    Frequency of Communication with Friends and Family: Never    Frequency of Social Gatherings with Friends and Family: Never    Attends Religious Services: Never    Database Administrator or Organizations: No    Attends Banker Meetings: Never    Marital Status: Married  Catering Manager Violence: Not At Risk (01/30/2024)  Humiliation, Afraid, Rape, and Kick questionnaire    Fear of Current or Ex-Partner: No    Emotionally Abused: No    Physically Abused: No    Sexually Abused: No    Outpatient Medications Prior to Visit  Medication Sig Dispense Refill   albuterol  (VENTOLIN  HFA) 108 (90 Base) MCG/ACT inhaler Inhale 2 puffs into the lungs every 6 (six) hours as needed for wheezing or shortness of breath. 8 g 2   amLODipine  (NORVASC ) 5 MG tablet Take 1 tablet (5 mg total) by mouth daily. 30 tablet 11   aspirin  EC 81 MG tablet Take 81 mg by mouth daily.     atorvastatin  (LIPITOR) 10 MG tablet Take 1 tablet (10 mg total) by mouth daily. 90 tablet 2   benztropine  (COGENTIN ) 1 MG tablet Take 1 tablet (1 mg total) by mouth at bedtime. 90 tablet 0   Blood Glucose Monitoring Suppl (TRUE METRIX METER) w/Device KIT Use as directed to monitor blood sugar. 1 kit 0   Cholecalciferol (VITAMIN D -3) 125 MCG (5000 UT) TABS Take 2 tablets by mouth daily. 30 tablet 1   ferrous sulfate  325 (65  FE) MG tablet Take 325 mg by mouth daily with breakfast.     gabapentin  (NEURONTIN ) 300 MG capsule Take 1 capsule (300 mg total) by mouth daily. 90 capsule 2   Glucagon  (GVOKE HYPOPEN  2-PACK) 1 MG/0.2ML SOAJ Inject 1 mg into the skin daily as needed. 0.2 mL 1   glucose blood (TRUE METRIX BLOOD GLUCOSE TEST) test strip Use as instructed to monitor blood sugar twice daily 50 each 11   Insulin  Glargine (BASAGLAR  KWIKPEN) 100 UNIT/ML Inject 21 Units into the skin 2 (two) times daily. 12 mL 4   insulin  lispro (HUMALOG ) 100 UNIT/ML KwikPen Take before each meal 3 times a day, 140-199 - 2 units, 200-250 - 4 units, 251-299 - 6 units, 300-349 - 8 units, 350 or above 10 units 15 mL 3   Insulin  Pen Needle (PEN NEEDLES) 31G X 6 MM MISC For 4 times a day insulin , 1 month supply. Diagnosis E11.65 100 each 0   levothyroxine  (SYNTHROID ) 75 MCG tablet Take 1 tablet (75 mcg total) by mouth daily before breakfast. 90 tablet 1   lithium  carbonate 300 MG capsule Take 1 capsule (300 mg total) by mouth daily. 30 capsule 2   losartan  (COZAAR ) 100 MG tablet Take 1 tablet (100 mg total) by mouth daily. 90 tablet 3   Multiple Vitamin (MULTIVITAMIN) tablet Take 1 tablet by mouth daily.     risperiDONE  (RISPERDAL ) 3 MG tablet Take 2 tablets (6 mg total) by mouth at bedtime. 60 tablet 2   TRUEplus Lancets 30G MISC Use as directed to monitor blood sugar twice daily 100 each 3   rizatriptan  (MAXALT ) 10 MG tablet Take 1 tablet (10 mg total) by mouth as needed for migraine. May repeat in 2 hours if needed 10 tablet 2   DULoxetine  (CYMBALTA ) 60 MG capsule Take 1 capsule (60 mg total) by mouth 2 (two) times daily. (Patient not taking: Reported on 04/25/2024) 180 capsule 2   lithium  carbonate (LITHOBID ) 300 MG ER tablet Take 1 tablet (300 mg total) by mouth daily. 30 tablet 0   traZODone  (DESYREL ) 50 MG tablet Take 1 tablet (50 mg total) by mouth at bedtime as needed for sleep. (Patient not taking: Reported on 04/25/2024) 30 tablet 0    benztropine  (COGENTIN ) 1 MG tablet Take 1 tablet (1 mg total) by mouth at bedtime. 30 tablet 2  No facility-administered medications prior to visit.    Allergies  Allergen Reactions   Metformin Hcl Diarrhea    ROS Review of Systems  Constitutional:  Negative for appetite change, chills, fatigue and fever.  HENT:  Negative for congestion, postnasal drip, rhinorrhea and sneezing.   Respiratory:  Negative for cough, shortness of breath and wheezing.   Cardiovascular:  Negative for chest pain, palpitations and leg swelling.  Gastrointestinal:  Positive for abdominal pain and constipation. Negative for nausea and vomiting.  Genitourinary:  Negative for difficulty urinating, dysuria, flank pain and frequency.  Musculoskeletal:  Positive for arthralgias and back pain. Negative for joint swelling and myalgias.  Skin:  Negative for color change, pallor, rash and wound.  Neurological:  Negative for syncope, speech difficulty, weakness and numbness.  Psychiatric/Behavioral:  Negative for behavioral problems, confusion, self-injury and suicidal ideas.       Objective:    Physical Exam Vitals and nursing note reviewed.  Constitutional:      General: She is not in acute distress.    Appearance: Normal appearance. She is not ill-appearing, toxic-appearing or diaphoretic.  HENT:     Mouth/Throat:     Pharynx: No posterior oropharyngeal erythema.  Eyes:     General: No scleral icterus.       Right eye: No discharge.        Left eye: No discharge.     Extraocular Movements: Extraocular movements intact.     Conjunctiva/sclera: Conjunctivae normal.  Cardiovascular:     Rate and Rhythm: Normal rate and regular rhythm.     Pulses: Normal pulses.     Heart sounds: Normal heart sounds. No murmur heard.    No friction rub. No gallop.  Pulmonary:     Effort: Pulmonary effort is normal. No respiratory distress.     Breath sounds: Normal breath sounds. No stridor. No wheezing, rhonchi or rales.   Chest:     Chest wall: No tenderness.  Abdominal:     General: There is no distension.     Palpations: Abdomen is soft.     Tenderness: There is abdominal tenderness. There is no right CVA tenderness, left CVA tenderness or guarding.  Musculoskeletal:        General: Tenderness present. No swelling, deformity or signs of injury.     Right lower leg: No edema.     Left lower leg: No edema.     Comments: Tenderness on palpation of lower back  Skin:    General: Skin is warm and dry.     Capillary Refill: Capillary refill takes less than 2 seconds.     Coloration: Skin is not jaundiced or pale.     Findings: No bruising, erythema or lesion.  Neurological:     Mental Status: She is alert and oriented to person, place, and time.     Motor: No weakness.     Coordination: Coordination normal.     Gait: Gait normal.  Psychiatric:        Mood and Affect: Mood normal.        Behavior: Behavior normal.        Thought Content: Thought content normal.        Judgment: Judgment normal.     BP 115/67   Pulse 99   Wt 165 lb (74.8 kg)   LMP 01/23/2013   SpO2 100%   BMI 32.22 kg/m  Wt Readings from Last 3 Encounters:  04/25/24 165 lb (74.8 kg)  04/17/24 168 lb (76.2  kg)  04/01/24 168 lb (76.2 kg)    Lab Results  Component Value Date   TSH 0.466 04/01/2024   Lab Results  Component Value Date   WBC 8.2 04/17/2024   HGB 11.1 (L) 04/17/2024   HCT 33.3 (L) 04/17/2024   MCV 86.7 04/17/2024   PLT 322 04/17/2024   Lab Results  Component Value Date   NA 139 04/17/2024   K 3.8 04/17/2024   CO2 28 04/17/2024   GLUCOSE 293 (H) 04/17/2024   BUN 12 04/17/2024   CREATININE 0.88 04/17/2024   BILITOT 0.7 04/17/2024   ALKPHOS 84 04/17/2024   AST 26 04/17/2024   ALT 26 04/17/2024   PROT 7.9 04/17/2024   ALBUMIN 4.8 04/17/2024   CALCIUM  9.3 04/17/2024   ANIONGAP 10 04/17/2024   EGFR 61 12/11/2023   Lab Results  Component Value Date   CHOL 111 01/29/2024   Lab Results   Component Value Date   HDL 57 01/29/2024   Lab Results  Component Value Date   LDLCALC 39 01/29/2024   Lab Results  Component Value Date   TRIG 76 01/29/2024   Lab Results  Component Value Date   CHOLHDL 1.9 01/29/2024   Lab Results  Component Value Date   HGBA1C 8.5 (H) 01/29/2024      Assessment & Plan:   Problem List Items Addressed This Visit       Cardiovascular and Mediastinum   Migraine   Relevant Medications   rizatriptan  (MAXALT ) 10 MG tablet     Digestive   IBS (irritable bowel syndrome)   Linzess  290 mcg daily refilled      Relevant Medications   linaclotide  (LINZESS ) 290 MCG CAPS capsule     Endocrine   Uncontrolled type 2 diabetes mellitus with hyperglycemia, with long-term current use of insulin  (HCC)   Lab Results  Component Value Date   HGBA1C 8.5 (H) 01/29/2024    Uncontrolled blood glucose with dietary indiscretions. Elevated morning glucose levels, variable postprandial levels. A1c pending, goal <7%. - Continue Lantus  21 units twice daily. - Continue sliding scale insulin  regimen. - Avoid sugary drinks, including ginger ale. - Monitor blood glucose levels regularly. - Review A1c results when available.         Nervous and Auditory   Lumbar radiculopathy   Advised Tylenol  650 mg every 6 hours as needed, continue gabapentin  300 mg daily, continue use of heating pad Follow-up with orthopedics as planned        Other   Constipation   Chronic constipation has history of IBS Persistent abdominal  pain with significant stool burden on CT. Linzess  provided minimal relief. - Continue Linzess  290 mcg daily. - Use stool softener as needed. - Try laxative powder as recommended by pharmacist. - Increase dietary fiber intake, including chia seeds soaked overnight. - Ensure adequate hydration.       Relevant Medications   linaclotide  (LINZESS ) 290 MCG CAPS capsule   Encounter for examination following treatment at hospital   Hospital  chart reviewed, including discharge summary Medications reconciled and reviewed with the patient in detail       Bilateral calf pain   She was referred to vascular but has not been seen New referral placed       Relevant Orders   Ambulatory referral to Vascular Surgery   Abdominal pain - Primary   Recent abdominal CT showed no acute intra-abdominal or pelvic pathology. 2. Moderate colonic stool burden. No bowel obstruction. Normal appendix. Continue Linzess  290  mcg daily, stool softener as needed, laxative as needed increase intake of fiber Due for colonoscopy, last one was in 2022 with recommendation to repeat every 3 years, she is working on getting a manufacturing engineer and then we will Follow-up with GI       Meds ordered this encounter  Medications   rizatriptan  (MAXALT ) 10 MG tablet    Sig: Take 1 tablet (10 mg total) by mouth as needed for migraine. May repeat in 2 hours if needed    Dispense:  10 tablet    Refill:  2   linaclotide  (LINZESS ) 290 MCG CAPS capsule    Sig: Take 1 capsule (290 mcg total) by mouth daily before breakfast.    Dispense:  90 capsule    Refill:  1    PAP only for uninsured    Follow-up: No follow-ups on file.    Ellanora Rayborn R Lakendria Nicastro, FNP

## 2024-04-25 NOTE — Assessment & Plan Note (Signed)
 Advised Tylenol  650 mg every 6 hours as needed, continue gabapentin  300 mg daily, continue use of heating pad Follow-up with orthopedics as planned

## 2024-04-25 NOTE — Patient Instructions (Signed)
 Goal for fasting blood sugar ranges from 80 to 120 and 2 hours after any meal or at bedtime should be between 130 to 170.    1. Migraine without aura and without status migrainosus, not intractable  - rizatriptan  (MAXALT ) 10 MG tablet; Take 1 tablet (10 mg total) by mouth as needed for migraine. May repeat in 2 hours if needed  Dispense: 10 tablet; Refill: 2  2. Abdominal pain, unspecified abdominal location (Primary)   3. Bilateral calf pain  - Ambulatory referral to Vascular Surgery  4. Constipation, unspecified constipation type  - linaclotide  (LINZESS ) 290 MCG CAPS capsule; Take 1 capsule (290 mcg total) by mouth daily before breakfast.  Dispense: 90 capsule; Refill: 1    It is important that you exercise regularly at least 30 minutes 5 times a week as tolerated  Think about what you will eat, plan ahead. Choose  clean, green, fresh or frozen over canned, processed or packaged foods which are more sugary, salty and fatty. 70 to 75% of food eaten should be vegetables and fruit. Three meals at set times with snacks allowed between meals, but they must be fruit or vegetables. Aim to eat over a 12 hour period , example 7 am to 7 pm, and STOP after  your last meal of the day. Drink water ,generally about 64 ounces per day, no other drink is as healthy. Fruit juice is best enjoyed in a healthy way, by EATING the fruit.  Thanks for choosing [facility] we consider it a privelige to serve you.

## 2024-04-25 NOTE — Assessment & Plan Note (Addendum)
 She was referred to vascular but has not been seen New referral placed

## 2024-04-26 ENCOUNTER — Other Ambulatory Visit: Payer: Self-pay

## 2024-04-29 ENCOUNTER — Other Ambulatory Visit: Payer: Self-pay

## 2024-05-02 ENCOUNTER — Other Ambulatory Visit: Payer: Self-pay

## 2024-05-03 ENCOUNTER — Other Ambulatory Visit: Payer: MEDICAID

## 2024-05-03 ENCOUNTER — Encounter: Payer: Self-pay | Admitting: Orthopedic Surgery

## 2024-05-03 ENCOUNTER — Other Ambulatory Visit: Payer: Self-pay

## 2024-05-03 ENCOUNTER — Ambulatory Visit: Payer: MEDICAID | Admitting: Orthopedic Surgery

## 2024-05-03 VITALS — BP 130/85 | HR 83 | Ht 60.0 in | Wt 163.0 lb

## 2024-05-03 DIAGNOSIS — M545 Low back pain, unspecified: Secondary | ICD-10-CM

## 2024-05-03 MED ORDER — METHOCARBAMOL 500 MG PO TABS
500.0000 mg | ORAL_TABLET | Freq: Two times a day (BID) | ORAL | 0 refills | Status: AC
  Filled 2024-05-03: qty 60, 30d supply, fill #0

## 2024-05-03 NOTE — Progress Notes (Signed)
 New Patient Visit  Summary: Kathryn [name] is a 53 y.o. female with the following: 1. Lumbar pain  Assessment and Plan Assessment & Plan Chronic low back pain with lumbar spondylosis post-surgery Chronic low back pain with radiation to the left leg and neuropathy in the feet. X-rays show decreased disc height and sclerotic changes at L4-5 with anterior osteophytes, indicating arthritis.  - Prescribed muscle relaxer for short-term use. - Referred to physical therapy for back exercises. - Advised use of heat and ice for pain management. - Recommended continuation of Tylenol  for pain relief. - Instructed to contact surgeon if symptoms do not improve with conservative measures.     Follow-up: Return if symptoms worsen or fail to improve.  Subjective:  Chief Complaint  Patient presents with   Back Pain    Pt states it's the whole spine getting worse over the past yr.    Discussed the use of AI scribe software for clinical note transcription with the patient, who gave verbal consent to proceed.  History of Present Illness Kathryn [name] is a 52 year old female with a history of back surgery who presents with worsening lower back pain.  She has had persistent lower back pain for three to four years following back surgery in 2022. Pain is bilateral in the lower back and is worse with bending forward. Heat, ice, and Tylenol  have not provided relief. She has not done physical therapy recently. Pain has intensified over the past two weeks without a specific inciting event.  She has sharp pain radiating into her left leg to the toes, with numbness and tingling in her feet that she attributes to neuropathy. She denies recent changes in bowel or bladder function.  She has diabetes and engages in some home exercises, including squats on a machine. Her past medical history also includes irritable bowel syndrome or constipation, schizophrenia, bipolar disorder, anxiety, and  depression.    Review of Systems: No fevers or chills + numbness or tingling No chest pain No shortness of breath No bowel or bladder dysfunction No GI distress No headaches   Medical History:  Past Medical History:  Diagnosis Date   Anemia    Anxiety    Asthma    Depression    GERD (gastroesophageal reflux disease)    HLD (hyperlipidemia) 04/07/2019   Hypertension    Hypothyroidism, adult 04/07/2019   Neuropathy    Obesity (BMI 30.0-34.9) 04/07/2019   Schizoaffective disorder (HCC)    Type II diabetes mellitus, uncontrolled 04/27/2019    Past Surgical History:  Procedure Laterality Date   BACK SURGERY  09/06/2020   BIOPSY  05/03/2021   Procedure: BIOPSY;  Surgeon: Eartha Angelia Sieving, MD;  [facility];  Service: Gastroenterology;;   BREAST SURGERY Right    biopsy   CESAREAN SECTION     CHOLECYSTECTOMY  06/02/2012   Procedure: LAPAROSCOPIC CHOLECYSTECTOMY;  Surgeon: Oneil DELENA Budge, MD;  Location: [facility];  Service: General;  Laterality: N/A;  Attempted laparoscopic cholecystectomy   CHOLECYSTECTOMY  06/02/2012   Procedure: CHOLECYSTECTOMY;  Surgeon: Oneil DELENA Budge, MD;  Location: [facility];  Service: General;  Laterality: N/A;  converted to open at  0905   COLONOSCOPY WITH PROPOFOL  N/A 07/18/2020   prep was fair, one 4mm polyp (inflammatory) stool in descending colon, transverse and ascending, distal rectum and anal verge normal   ESOPHAGEAL DILATION  12/31/2018   Procedure: ESOPHAGEAL DILATION;  Surgeon: Golda Claudis PENNER, MD;  [facility]: [facility];  Service:  Endoscopy;;   ESOPHAGOGASTRODUODENOSCOPY (EGD) WITH PROPOFOL  N/A 08/24/2015   Procedure: ESOPHAGOGASTRODUODENOSCOPY (EGD) WITH PROPOFOL ;  Surgeon: Claudis RAYMOND Rivet, MD;  [facility]: [facility];  Service: Endoscopy;  Laterality: N/A;  1:10 - Ann to notify pt to arrive at 11:30   ESOPHAGOGASTRODUODENOSCOPY (EGD) WITH PROPOFOL  N/A 12/31/2018   normal, no abnormality to explain dysphagia, esophagus  dilated.   ESOPHAGOGASTRODUODENOSCOPY (EGD) WITH PROPOFOL  N/A 05/03/2021   Procedure: ESOPHAGOGASTRODUODENOSCOPY (EGD) WITH PROPOFOL ;  Surgeon: Eartha Angelia Sieving, MD;  [facility];  Service: Gastroenterology;  Laterality: N/A;  1:20   FLEXIBLE SIGMOIDOSCOPY  07/17/2020   Procedure: FLEXIBLE SIGMOIDOSCOPY;  Surgeon: Eartha Angelia, Sieving, MD;  [facility]: [facility];  Service: Gastroenterology;;   POLYPECTOMY  07/18/2020   Procedure: POLYPECTOMY INTESTINAL;  Surgeon: Eartha Angelia, Sieving, MD;  [facility];  Service: Gastroenterology;;  ascending colon polyp;    SAVORY DILATION  05/03/2021   Procedure: SAVORY DILATION;  Surgeon: Eartha Angelia, Sieving, MD;  [facility];  Service: Gastroenterology;;   tooth removal  2019   all teeth removed   TUBAL LIGATION     X3    Family History  Problem Relation Age of Onset   Hypertension Mother    Alcohol abuse Mother    Heart disease Mother    Kidney disease Mother    Aneurysm Mother        brain   Hypertension Father    Alcohol abuse Sister    Alcohol abuse Maternal Aunt    Cancer Maternal Aunt    Alcohol abuse Paternal Aunt    Alcohol abuse Maternal Grandmother    Alcohol abuse Maternal Grandfather    Hearing loss Daughter    Alcohol abuse Cousin    Stroke Other    Diabetes Other    Cancer Other    Seizures Other    Social History   Tobacco Use   Smoking status: Never    Passive exposure: Never   Smokeless tobacco: Never  Vaping Use   Vaping status: Never Used  Substance Use Topics   Alcohol use: No    Alcohol/week: 0.0 standard drinks of alcohol   Drug use: No    Allergies  Allergen Reactions   Metformin Hcl Diarrhea    Current Meds  Medication Sig   albuterol  (VENTOLIN  HFA) 108 (90 Base) MCG/ACT inhaler Inhale 2 puffs into the lungs every 6 (six) hours as needed for wheezing or shortness of breath.   amLODipine  (NORVASC ) 5 MG tablet Take 1 tablet (5 mg total) by  mouth daily.   aspirin  EC 81 MG tablet Take 81 mg by mouth daily.   atorvastatin  (LIPITOR) 10 MG tablet Take 1 tablet (10 mg total) by mouth daily.   benztropine  (COGENTIN ) 1 MG tablet Take 1 tablet (1 mg total) by mouth at bedtime.   Blood Glucose Monitoring Suppl (TRUE METRIX METER) w/Device KIT Use as directed to monitor blood sugar.   Cholecalciferol (VITAMIN D -3) 125 MCG (5000 UT) TABS Take 2 tablets by mouth daily.   ferrous sulfate  325 (65 FE) MG tablet Take 325 mg by mouth daily with breakfast.   gabapentin  (NEURONTIN ) 300 MG capsule Take 1 capsule (300 mg total) by mouth daily.   Glucagon  (GVOKE HYPOPEN  2-PACK) 1 MG/0.2ML SOAJ Inject 1 mg into the skin daily as needed.   glucose blood (TRUE METRIX BLOOD GLUCOSE TEST) test strip Use as instructed to monitor blood sugar twice daily   Insulin  Glargine (BASAGLAR  KWIKPEN) 100 UNIT/ML Inject  21 Units into the skin 2 (two) times daily.   insulin  lispro (HUMALOG ) 100 UNIT/ML KwikPen Take before each meal 3 times a day, 140-199 - 2 units, 200-250 - 4 units, 251-299 - 6 units, 300-349 - 8 units, 350 or above 10 units   Insulin  Pen Needle (PEN NEEDLES) 31G X 6 MM MISC For 4 times a day insulin , 1 month supply. Diagnosis E11.65   levothyroxine  (SYNTHROID ) 75 MCG tablet Take 1 tablet (75 mcg total) by mouth daily before breakfast.   linaclotide  (LINZESS ) 290 MCG CAPS capsule Take 1 capsule (290 mcg total) by mouth daily before breakfast.   lithium  carbonate (LITHOBID ) 300 MG ER tablet Take 1 tablet (300 mg total) by mouth daily.   lithium  carbonate 300 MG capsule Take 1 capsule (300 mg total) by mouth daily.   losartan  (COZAAR ) 100 MG tablet Take 1 tablet (100 mg total) by mouth daily.   methocarbamol  (ROBAXIN ) 500 MG tablet Take 1 tablet (500 mg total) by mouth 2 (two) times daily.   Multiple Vitamin (MULTIVITAMIN) tablet Take 1 tablet by mouth daily.   risperiDONE  (RISPERDAL ) 3 MG tablet Take 2 tablets (6 mg total) by mouth at bedtime.    rizatriptan  (MAXALT ) 10 MG tablet Take 1 tablet (10 mg total) by mouth as needed for migraine. May repeat in 2 hours if needed   TRUEplus Lancets 30G MISC Use as directed to monitor blood sugar twice daily    Objective: BP 130/85   Pulse 83   Ht 5' (1.524 m)   Wt 163 lb (73.9 kg)   LMP 01/23/2013   BMI 31.83 kg/m   Physical Exam:    General: Alert and oriented. and No acute distress. Gait: Normal gait.  Physical Exam MUSCULOSKELETAL: Spine with well-healed surgical incision and diffuse tenderness to palpation. Tenderness on both sides of hips. NEUROLOGICAL: Neuropathy in feet, full sensation in lower extremities. Excellent lower body strength, 2+ patellar tendon reflexes bilaterally.  Negative straight leg raise bilaterally.  No pain in the groin with internal or external rotation of the hips.  IMAGING: I personally ordered and reviewed the following images   Standing lumbar spine x-rays were obtained in clinic today.  No acute injuries are noted.  Well-maintained disc height, with the exception of L4-5, where there is decreased joint space.  In addition, there are anterior based osteophytes.  No anterolisthesis.  No bony lesions.  No additional injuries noted.  Impression: L4-5 spondylosis, with decreased disc height, and anterior based osteophytes.     New Medications:  Meds ordered this encounter  Medications   methocarbamol  (ROBAXIN ) 500 MG tablet    Sig: Take 1 tablet (500 mg total) by mouth 2 (two) times daily.    Dispense:  60 tablet    Refill:  0      Portions of this note were completed via Scientist, clinical (histocompatibility and immunogenetics).  Oneil DELENA Horde, MD  05/03/2024 1:59 PM

## 2024-05-03 NOTE — Addendum Note (Signed)
 Addended by: VICENTA EMMIE HERO on: 05/03/2024 02:03 PM   Modules accepted: Orders

## 2024-05-04 ENCOUNTER — Other Ambulatory Visit: Payer: MEDICAID

## 2024-05-04 DIAGNOSIS — E1165 Type 2 diabetes mellitus with hyperglycemia: Secondary | ICD-10-CM

## 2024-05-05 LAB — HEMOGLOBIN A1C
Est. average glucose Bld gHb Est-mCnc: 212 mg/dL
Hgb A1c MFr Bld: 9 % — ABNORMAL HIGH (ref 4.8–5.6)

## 2024-05-06 ENCOUNTER — Other Ambulatory Visit: Payer: Self-pay

## 2024-05-06 ENCOUNTER — Other Ambulatory Visit (HOSPITAL_COMMUNITY): Payer: Self-pay

## 2024-05-06 DIAGNOSIS — Z794 Long term (current) use of insulin: Secondary | ICD-10-CM

## 2024-05-06 DIAGNOSIS — E1165 Type 2 diabetes mellitus with hyperglycemia: Secondary | ICD-10-CM

## 2024-05-06 MED ORDER — DEXCOM G7 RECEIVER DEVI
0 refills | Status: DC
  Filled 2024-05-06 – 2024-05-16 (×3): qty 1, 90d supply, fill #0

## 2024-05-06 MED ORDER — DEXCOM G7 SENSOR MISC
3 refills | Status: AC
  Filled 2024-05-06 – 2024-05-12 (×2): qty 9, 90d supply, fill #0

## 2024-05-06 MED ORDER — TRULICITY 0.75 MG/0.5ML ~~LOC~~ SOAJ
0.7500 mg | SUBCUTANEOUS | 3 refills | Status: AC
  Filled 2024-05-06: qty 2, 28d supply, fill #0
  Filled 2024-06-16: qty 2, 28d supply, fill #1

## 2024-05-06 NOTE — Progress Notes (Signed)
 05/06/2024 Name: Kathryn [name] MRN: [id number] DOB: 1971/02/24  Chief Complaint  Patient presents with   Diabetes    Kathryn [name] is a 53 y.o. year old female who presented for a telephone visit.   They were referred to the pharmacist by their PCP for assistance in managing diabetes. PMH includes HTN, asthma, IBS, dysphagia, gastroparesis with abnormal esophagram, T2DM with neuropathy, PTSD, schizoaffective disorder, BMI > 30. Hx of hospitalization for hypoglycemia (June 2025).   Subjective: Patient was last seen by PCP, Lorice Shall, NP on 12/0/125. A1C had increased to 9.0%. Patient reported it was due to increased soda intake. She reported adherence to basal/bolus insulin  regimen. She continued to report uncontrolled constipation and was re-prescribed linaclotide  290 mcg daily. She reported her Medicaid was pending. PCP messaged me prior to today's appt to discuss restarting Trulicity , since patient is not achieving glycemic control on current regimen.  Today, patient reports doing ok. She reports she has not had soda since her PCP appt. Instead having diet lipton tea and water . Her Medicaid was approved. She is interested in restarting Dexcom sensor.   Care Team: Primary Care Provider: Paseda, Folashade R, FNP ; Next Scheduled Visit: 08/05/24 Behavioral Health: established with new provider 02/08/24 after hospital discharge   Medication Access/Adherence  Current Pharmacy:  [facility] [address], [address] [place] [address] Phone: [phone] Fax: [phone]   Patient reports affordability concerns with their medications: Yes - recently approved for Medicaid Patient reports access/transportation concerns to their pharmacy: No  Patient reports adherence concerns with their medications:  Yes  - patient has a history of stopping medications without reaching out to troubleshoot cost/adherence issues. Denies missed  doses of Basaglar .   Diabetes:  Current medications: Basaglar  21 units twice daily in the morning and evening (denies missed doses), Humalog  before each meal 3 times a day per sliding scale (140-199 - 2 units, 200-250 - 4 units, 251-299 - 6 units, 300-349 - 8 units, 350 or above 10 units) (usually taking 2-4 units with each meal)  Medications tried in the past: metformin - intolerance, restarted Trulicity  0.75 mg on 11/09/23 then d/c at last hospitalization- has at home still.   Current glucose readings: Wanting to restart Dexcom now that she is insured. Previously connected to phone and receiver. Using True Metrix meter - checking 2-3 times daily 05/06/24: 6AM 189 05/05/24: 4AM 197, 1PM 187, 9PM 212 05/04/24: 8AM 199, 12PM 202, 9PM 236  05/03/24: 7AM 163, 12PM 201, 7PM 404 05/02/24: 7AM 118, 1PM 186, 8PM 265 Denies BG < 70 mg/dL  Patient denies hypoglycemic s/sx including dizziness, shakiness, sweatiness.. Patient denies hyperglycemic symptoms including nocturia, polyuria, polydipsia, polyphagia, nocturia, blurred vision, neuropathy.  Current meal patterns:  Eating three times per day. Eats a sandwich before bedtime (whole wheat bread, turkey, cheese).  - Drinks: water  and diet tea, trying to avoid soda  Current physical activity: did not discuss today  Current medication access support: Medicaid  Hypertension:  Current medications: losartan  100 mg daily, amlodipine  5 mg daily Medications previously tried: lisinopril , amlodipine  10 mg and carvedilol  12.5 mg BID were recently discontinued during 11/23/23 hospitalization - she put remaining supply to the side at home.   Hyperlipidemia/ASCVD Risk Reduction  Current lipid lowering medications: atorvastatin  10 mg daily  Medications tried in the past: simvastatin  Antiplatelet regimen: aspirin  81 mg daily   ASCVD History: none. Normal echo (2018) and stress test (2022) Family History: Heart  disease and aneurysm (brain) in Mother Risk  Factors: T2DM, HTN  Clinical ASCVD: No  The ASCVD Risk score (Arnett DK, et al., 2019) failed to calculate for the following reasons:   The valid total cholesterol range is 130 to 320 mg/dL    Objective:  BP Readings from Last 3 Encounters:  05/03/24 130/85  04/25/24 115/67  04/17/24 (!) 147/86   Lab Results  Component Value Date   HGBA1C 9.0 (H) 05/04/2024   HGBA1C 8.5 (H) 01/29/2024   HGBA1C 8.0 (H) 11/24/2023    Lab Results  Component Value Date   CREATININE 0.88 04/17/2024   BUN 12 04/17/2024   NA 139 04/17/2024   K 3.8 04/17/2024   CL 101 04/17/2024   CO2 28 04/17/2024    Lab Results  Component Value Date   CHOL 111 01/29/2024   HDL 57 01/29/2024   LDLCALC 39 01/29/2024   TRIG 76 01/29/2024   CHOLHDL 1.9 01/29/2024    Medications Reviewed Today     Reviewed by Brinda Lorain SQUIBB, RPH-CPP (Pharmacist) on 05/06/24 at 1108  Med List Status: <None>   Medication Order Taking? Sig Documenting Provider Last Dose Status Informant  albuterol  (VENTOLIN  HFA) 108 (90 Base) MCG/ACT inhaler [id number] Yes Inhale 2 puffs into the lungs every 6 (six) hours as needed for wheezing or shortness of breath. Bacchus, Meade PEDLAR, FNP  Active Multiple Informants           Med Note DONETTE, TOBIAS DEL   Fri Jan 29, 2024  2:30 PM) Last dose was last month  amLODipine  (NORVASC ) 5 MG tablet [id number] Yes Take 1 tablet (5 mg total) by mouth daily. Paseda, Folashade R, FNP  Active   aspirin  EC 81 MG tablet [id number] Yes Take 81 mg by mouth daily. [provider]  Active Multiple Informants  atorvastatin  (LIPITOR) 10 MG tablet [id number] Yes Take 1 tablet (10 mg total) by mouth daily. Paseda, Folashade R, FNP  Active Multiple Informants  benztropine  (COGENTIN ) 1 MG tablet [id number] Yes Take 1 tablet (1 mg total) by mouth at bedtime. Paseda, Folashade R, FNP  Active Multiple Informants  Blood Glucose Monitoring Suppl (TRUE METRIX METER) w/Device KIT [id number] Yes Use as directed to monitor blood  sugar. Paseda, Folashade R, FNP  Active Multiple Informants  Cholecalciferol (VITAMIN D -3) 125 MCG (5000 UT) TABS [id number] Yes Take 2 tablets by mouth daily. [name], Gebreselassie W, MD  Active Multiple Informants  Continuous Glucose Receiver (DEXCOM G7 [place]) [place] [id number] Yes Use as directed with Dexcom G7 sensor Paseda, Folashade R, FNP  Active   Continuous Glucose Sensor (DEXCOM G7 SENSOR) MISC [id number] Yes Change sensor every 10 days Paseda, Folashade R, FNP  Active   Dulaglutide  (TRULICITY ) 0.75 MG/0.5ML SOAJ [id number] Yes Inject 0.75 mg into the skin once a week. Paseda, Folashade R, FNP  Active   ferrous sulfate  325 (65 FE) MG tablet [id number] Yes Take 325 mg by mouth daily with breakfast. [provider]  Active Multiple Informants  gabapentin  (NEURONTIN ) 300 MG capsule [id number] Yes Take 1 capsule (300 mg total) by mouth daily. Paseda, Folashade R, FNP  Active Multiple Informants  Glucagon  (GVOKE HYPOPEN  2-PACK) 1 MG/0.2ML SOAJ [id number] Yes Inject 1 mg into the skin daily as needed. Paseda, Folashade R, FNP  Active Multiple Informants  glucose blood (TRUE METRIX BLOOD GLUCOSE TEST) test strip [id number] Yes Use as instructed to monitor blood sugar twice daily Paseda, Folashade R, FNP  Active Multiple Informants  Insulin  Glargine (BASAGLAR  KWIKPEN) 100 UNIT/ML  [id number] Yes Inject 21 Units into the skin 2 (two) times daily. Paseda, Folashade R, FNP  Active    Discontinued 05/06/24 [phone] (Change in therapy) Insulin  Pen Needle (PEN NEEDLES) 31G X 6 MM MISC [id number] Yes For 4 times a day insulin , 1 month supply. Diagnosis E11.65 Paseda, Folashade R, FNP  Active Multiple Informants  levothyroxine  (SYNTHROID ) 75 MCG tablet [id number] Yes Take 1 tablet (75 mcg total) by mouth daily before breakfast. Paseda, Folashade R, FNP  Active   linaclotide  (LINZESS ) 290 MCG CAPS capsule [id number] Yes Take 1 capsule (290 mcg total) by mouth daily before breakfast. Paseda, Folashade R, FNP  Active    lithium  carbonate (LITHOBID ) 300 MG ER tablet [id number] Yes Take 1 tablet (300 mg total) by mouth daily. Lera Nancyann NOVAK, DO  Active   lithium  carbonate 300 MG capsule [id number] Yes Take 1 capsule (300 mg total) by mouth daily.   Active   losartan  (COZAAR ) 100 MG tablet [id number] Yes Take 1 tablet (100 mg total) by mouth daily. Paseda, Folashade R, FNP  Active Multiple Informants  methocarbamol  (ROBAXIN ) 500 MG tablet [id number]  Take 1 tablet (500 mg total) by mouth 2 (two) times daily. Onesimo Oneil LABOR, MD  Active   Multiple Vitamin (MULTIVITAMIN) tablet [id number] Yes Take 1 tablet by mouth daily. [provider]  Active Multiple Informants  risperiDONE  (RISPERDAL ) 3 MG tablet [id number] Yes Take 2 tablets (6 mg total) by mouth at bedtime.   Active   rizatriptan  (MAXALT ) 10 MG tablet [id number] Yes Take 1 tablet (10 mg total) by mouth as needed for migraine. May repeat in 2 hours if needed Paseda, Folashade R, FNP  Active   TRUEplus Lancets 30G MISC [id number] Yes Use as directed to monitor blood sugar twice daily Paseda, Folashade R, FNP  Active Multiple Informants              Assessment/Plan:   Diabetes: - Currently uncontrolled with last A1C of 9.0% above goal < 7%, and increased from 8.5%. Patient attributes increase to dietary indiscretion. We have been hesitant to continue titration of insulin  due to recent hospitalization for hypoglycemia, which may have been due to continue dose-increases of insulin  in the setting of nonadherence. However, with trend of worsening glycemic control, agree with PCP recommendation to restart Trulicity  and discontinue Novolog  per sliding scale. Patient agreeable to this plan. Also expect glycemic control to improve with resuming CGM monitoring.  - Reviewed long term cardiovascular and renal outcomes of uncontrolled blood sugar - Reviewed goal A1c, goal fasting, and goal 2 hour post prandial glucose - Reviewed dietary modifications including  reducing portion size of carbohydrate foods, increasing protein, increasing intake of non-starchy vegetables. Discussed avoiding soda and sugary beverages and increasing water  intake. - Reviewed lifestyle modifications including: increasing physical activity - Recommend to contniue Basaglar  21 units twice daily. Patient aware to notify clinic if she is having hypoglycemia with low BG < 70 mg/dL - Recommend to STOP Humalog  3 times daily before meals per sliding scale  - Recommend to START Trulicity  0.75 mg weekly. Reeducated on AE, administration, and storage. Submitted PA to cover my meds today.  - Patient denies personal or family history of multiple endocrine neoplasia type 2, medullary thyroid  cancer; personal history of pancreatitis or gallbladder disease. - Recommend to check glucose TID before meals (True Metrix meter and supplies for affordability). Also sending Rx for Dexcom G7 sensor and receiver. Submitted PA to cover my meds today. Patient to come in person for set  up on 05/10/24.    Hypertension: - Currently variably controlled with most clinic readings below goal < 130/80 mmHg. Fill hx appropriate. - Reviewed long term cardiovascular and renal outcomes of uncontrolled blood pressure - Reviewed appropriate blood pressure monitoring technique and reviewed goal blood pressure. Recommended to check home blood pressure and heart rate once daily - Recommend to continue losartan  100 mg daily and resume amlodipine  5 mg daily    Hyperlipidemia/ASCVD Risk Reduction: - Currently controlled with LDL-C of 39 mg/dL below goal < 70 mg/dL on atorvastatin  10 mg daily. Recommend to continue moderate intensity statin.  - Reviewed long term complications of uncontrolled cholesterol - Recommend to continue atorvastatin  10 mg daily   Follow Up Plan:  Pharmacist in person 05/10/24, PCP March 2026   Lorain Baseman, PharmD [facility] [phone]

## 2024-05-09 ENCOUNTER — Other Ambulatory Visit: Payer: Self-pay

## 2024-05-10 ENCOUNTER — Other Ambulatory Visit: Payer: Self-pay

## 2024-05-10 ENCOUNTER — Ambulatory Visit: Payer: MEDICAID

## 2024-05-10 NOTE — Progress Notes (Signed)
 PA for Dexcom G7 sensors submitted via CMM on 05/06/24 was DENIED. Key: [id number].  Stated criteria not met  Patient is treated with insulin  and should be eligible for CGM via Medicaid. Will request follow-up from patient advocate team.   Lorain Baseman, PharmD [facility] [phone]

## 2024-05-11 ENCOUNTER — Other Ambulatory Visit (HOSPITAL_COMMUNITY): Payer: Self-pay

## 2024-05-11 ENCOUNTER — Other Ambulatory Visit: Payer: Self-pay

## 2024-05-11 ENCOUNTER — Telehealth: Payer: Self-pay

## 2024-05-11 MED ORDER — BENZTROPINE MESYLATE 1 MG PO TABS
1.0000 mg | ORAL_TABLET | Freq: Every day | ORAL | 2 refills | Status: AC
  Filled 2024-05-11: qty 30, 30d supply, fill #0
  Filled 2024-06-16: qty 30, 30d supply, fill #1

## 2024-05-11 MED ORDER — RISPERIDONE 3 MG PO TABS
6.0000 mg | ORAL_TABLET | Freq: Every day | ORAL | 2 refills | Status: AC
  Filled 2024-05-11 – 2024-06-16 (×2): qty 60, 30d supply, fill #0

## 2024-05-11 MED ORDER — LITHIUM CARBONATE 300 MG PO CAPS: 300.0000 mg | ORAL_CAPSULE | Freq: Every day | ORAL | 2 refills | Status: AC

## 2024-05-11 NOTE — Telephone Encounter (Signed)
 DEXCOM G7 PRODUCTS PRIOR AUTH APPEAL SENT TODAY:

## 2024-05-12 ENCOUNTER — Other Ambulatory Visit: Payer: Self-pay

## 2024-05-12 NOTE — Telephone Encounter (Signed)
 Kathryn [name]

## 2024-05-13 ENCOUNTER — Encounter: Payer: Self-pay | Admitting: Dietician

## 2024-05-13 ENCOUNTER — Other Ambulatory Visit: Payer: Self-pay

## 2024-05-13 ENCOUNTER — Ambulatory Visit: Attending: Nurse Practitioner | Admitting: Dietician

## 2024-05-13 VITALS — Wt 163.0 lb

## 2024-05-13 DIAGNOSIS — E1165 Type 2 diabetes mellitus with hyperglycemia: Secondary | ICD-10-CM | POA: Diagnosis present

## 2024-05-13 DIAGNOSIS — Z794 Long term (current) use of insulin: Secondary | ICD-10-CM | POA: Insufficient documentation

## 2024-05-13 NOTE — Progress Notes (Signed)
 " Diabetes Self-Management Education  Visit Type: Follow-up  Appt. Start Time: 1125 Appt. End Time: 1200  05/13/2024  [name], identified by name and date of birth, is a 53 y.o. female with a diagnosis of Diabetes:  .   ASSESSMENT  Patient is here today with her daughter.  She was last seen by this RD on 03/04/2024. She said her A1C increased this month and has since stopped her regular soda.  Her husband is encouraging her. She was able to get the Dexcom since starting on Medicaid.  She brought this with her and it was started on a new phone.  It is now warming up.  Patient is here today with her cousin. Fasting glucose:  127 this am.  Usually less than 140 before each meal  States that she rarely requires the meal time insulin . She has been more active. She is drinking more water , and diet tea now and has stopped sweetened beverages. Occasional low blood glucose (69 last week).  Drank small amount of lemonade. Just started trulicity  and has tolerated well. She was at the ER recently for constipation and was restarted on Linzess .  This was prior to starting the GLP1 medication. Sleeping better on Trazadone and less sleeping during the day.     She is followed by Lorain Baseman, Pharmacist. She is Enrolled in [facility] program at [facility] through June 2026 and gets her insulin  for free with this program at the [facility]. She was hospitalized 11/23/23-11/27/2023 for severe hypoglyemia. She has dysphagia related to narrowing esophagus and inability to chew foods well due to no teeth.   Referral:  Type 2 Diabetes (2011) (Paseda, FNP) Medications include:  Basaglar  21 units bid, Trulicity  (0.75), levothyroxine , Lipitor, Vitamin D , iron, MVI, see lsit History includes:  Type 2 Diabetes, Schizoaffective disorder, dysphagia, edentulous, HTN, HLD, hypothyroidism, GERD, IBS- constipation, iron Labs:  A1C 9% 05/04/2024, 8.5% on 01/29/2024 increased from 8% 11/24/2023, 10.7% 10/23/2023,  C-peptide was 4.4 11/24/2023, eGFR 61 on 12/11/2023   60 163 lbs 05/12/2024 164 lbs 03/04/2024 157 lbs 01/22/2024 164 lbs 12/11/2023   Patient lives with her husband.  He does the shopping and cooking.  She states that she has reapplied for disability.  She states that she is out of work but is now on [employer].   She struggles with sweet tea and regular soda - now changed to water  and unsweetened tea Increased amount of sleeping. 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 She is watching her  51 yo grandson after school and they stay outside much of the time. She walks in the neighborhood every day.  Weight 163 lb (73.9 kg), last menstrual period 01/23/2013. Body mass index is 31.83 kg/m.   Diabetes Self-Management Education - 05/13/24 1200       Visit Information   Visit Type Follow-up      Psychosocial Assessment   Patient Belief/Attitude about Diabetes Motivated to manage diabetes    What is the hardest part about your diabetes right now, causing you the most concern, or is the most worrisome to you about your diabetes?   Making healty food and beverage choices    Self-care barriers Lack of material resources    Self-management support Doctor's office;CDE visits    Other persons present Patient;Spouse/SO;Family Member    Patient Concerns Nutrition/Meal planning;Monitoring;Problem Solving;Glycemic Control;Support    Special Needs None    Preferred Learning Style  No preference indicated    Learning Readiness Ready    How often do you need to have someone help you when you read instructions, pamphlets, or other written materials from your doctor or pharmacy? 4 - Often      Pre-Education Assessment   Patient understands the diabetes disease and treatment process. Needs Review    Patient understands incorporating nutritional management into lifestyle. Needs Review    Patient undertands  incorporating physical activity into lifestyle. Needs Review    Patient understands using medications safely. Needs Review    Patient understands monitoring blood glucose, interpreting and using results Needs Review    Patient understands prevention, detection, and treatment of acute complications. Needs Review    Patient understands prevention, detection, and treatment of chronic complications. Needs Review    Patient understands how to develop strategies to address psychosocial issues. Needs Review    Patient understands how to develop strategies to promote health/change behavior. Needs Review      Complications   Last HgB A1C per patient/outside source 9 %   05/04/24 increased from 8.5% 01/29/2024     Dietary Intake   Breakfast Special K with beries, 2j% milk, regular yogurt    Snack (morning) none    Lunch turkey sandwich on [employer] with mustard    Snack (afternoon) sugar free pudding    Dinner fried chicken, dirty rice    Snack (evening) none    Beverage(s) water , diet lipton tea      Activity / Exercise   Activity / Exercise Type Light (walking / raking leaves)    How many days per week do you exercise? 7    How many minutes per day do you exercise? 30    Total minutes per week of exercise 210      Patient Education   Previous Diabetes Education Yes    Healthy Eating Meal options for control of blood glucose level and chronic complications.    Being Active Role of exercise on diabetes management, blood pressure control and cardiac health.    Medications Reviewed patients medication for diabetes, action, purpose, timing of dose and side effects.    Monitoring Taught/evaluated CGM (comment);Identified appropriate SMBG and/or A1C goals.    Diabetes Stress and Support Identified and addressed patients feelings and concerns about diabetes;Worked with patient to identify barriers to care and solutions      Individualized Goals (developed by patient)   Nutrition General guidelines for  healthy choices and portions discussed    Physical Activity Exercise 5-7 days per week;30 minutes per day    Medications take my medication as prescribed    Monitoring  Consistenly use CGM    Problem Solving Addressing barriers to behavior change    Reducing Risk examine blood glucose patterns;do foot checks daily;treat hypoglycemia with 15 grams of carbs if blood glucose less than 70mg /dL      Patient Self-Evaluation of Goals - Patient rates self as meeting previously set goals (% of time)   Nutrition 50 - 75 % (half of the time)    Physical Activity >75% (most of the time)    Medications >75% (most of the time)    Monitoring 50 - 75 % (half of the time)    Problem Solving and behavior change strategies  50 - 75 % (half of the time)    Reducing Risk (treating acute and chronic complications) >75% (most of the time)    Health Coping >75% (most of the time)  Post-Education Assessment   Patient understands the diabetes disease and treatment process. Comprehends key points    Patient understands incorporating nutritional management into lifestyle. Needs Review    Patient undertands incorporating physical activity into lifestyle. Comprehends key points    Patient understands using medications safely. Comphrehends key points    Patient understands monitoring blood glucose, interpreting and using results Comprehends key points    Patient understands prevention, detection, and treatment of acute complications. Comprehends key points    Patient understands prevention, detection, and treatment of chronic complications. Comprehends key points    Patient understands how to develop strategies to address psychosocial issues. Needs Review    Patient understands how to develop strategies to promote health/change behavior. Needs Review      Outcomes   Expected Outcomes Demonstrated interest in learning. Expect positive outcomes    Future DMSE 2 months    Program Status Not Completed       Subsequent Visit   Since your last visit have you continued or begun to take your medications as prescribed? Yes    Since your last visit have you experienced any weight changes? Loss    Weight Loss (lbs) 1          Individualized Plan for Diabetes Self-Management Training:   Learning Objective:  Patient will have a greater understanding of diabetes self-management. Patient education plan is to attend individual and/or group sessions per assessed needs and concerns.   Plan:   Patient Instructions  Slowly increase your fiber (beans, greens, other vegetables, fresh fruit). Drink enough water .  Continue to walk every day. Weights or resistance exercise 2 times per week.  Continue to take your medications as prescribed.  Expected Outcomes:  Demonstrated interest in learning. Expect positive outcomes  Education material provided:   If problems or questions, patient to contact team via:  Phone  Future DSME appointment: 2 months  "

## 2024-05-13 NOTE — Patient Instructions (Addendum)
 Slowly increase your fiber (beans, greens, other vegetables, fresh fruit). Drink enough water .  Continue to walk every day. Weights or resistance exercise 2 times per week.  Continue to take your medications as prescribed.

## 2024-05-16 ENCOUNTER — Other Ambulatory Visit: Payer: Self-pay

## 2024-05-16 ENCOUNTER — Ambulatory Visit (HOSPITAL_COMMUNITY)

## 2024-05-16 DIAGNOSIS — I739 Peripheral vascular disease, unspecified: Secondary | ICD-10-CM

## 2024-05-18 ENCOUNTER — Ambulatory Visit: Payer: Self-pay | Admitting: Nurse Practitioner

## 2024-05-23 ENCOUNTER — Other Ambulatory Visit: Payer: Self-pay

## 2024-05-23 ENCOUNTER — Other Ambulatory Visit (HOSPITAL_COMMUNITY): Payer: Self-pay | Admitting: Neurosurgery

## 2024-05-23 DIAGNOSIS — M5126 Other intervertebral disc displacement, lumbar region: Secondary | ICD-10-CM

## 2024-05-23 DIAGNOSIS — M4722 Other spondylosis with radiculopathy, cervical region: Secondary | ICD-10-CM

## 2024-05-29 ENCOUNTER — Ambulatory Visit (HOSPITAL_COMMUNITY): Payer: MEDICAID

## 2024-05-31 ENCOUNTER — Ambulatory Visit: Payer: MEDICAID

## 2024-05-31 ENCOUNTER — Other Ambulatory Visit: Payer: Self-pay

## 2024-05-31 VITALS — BP 128/76 | HR 92

## 2024-05-31 DIAGNOSIS — E1165 Type 2 diabetes mellitus with hyperglycemia: Secondary | ICD-10-CM | POA: Diagnosis not present

## 2024-05-31 DIAGNOSIS — Z794 Long term (current) use of insulin: Secondary | ICD-10-CM

## 2024-05-31 MED ORDER — INSULIN GLARGINE 100 UNIT/ML SOLOSTAR PEN
16.0000 [IU] | PEN_INJECTOR | Freq: Two times a day (BID) | SUBCUTANEOUS | 3 refills | Status: AC
  Filled 2024-05-31: qty 30, 94d supply, fill #0

## 2024-05-31 MED ORDER — PENICILLIN V POTASSIUM 500 MG PO TABS
ORAL_TABLET | ORAL | 0 refills | Status: AC
  Filled 2024-05-31: qty 42, 11d supply, fill #0

## 2024-05-31 NOTE — Patient Instructions (Signed)
 It was nice to see you today!  Your goal blood sugar is 80-130 mg/dL before eating and less than 180 mg/dL after eating. Monitor blood sugars at home and keep a log (glucometer or piece of paper) to bring with you to your next visit.  Medication Changes:  Decrease insulin  glargine (Basaglar  or Lantus ) to 16 units twice daily  Continue Trulicity  0.75 mg weekly  Continue all other medication the same.   If you experience symptoms of a low blood sugar (dizzy, shaky, sweaty) check your blood sugar. If it is less than 70 mg/dL, you should eat or drink 15 grams of fast-acting carbohydrates like 4 glucose tablets, 1/2 cup of regular soda, or 1/2 cup of juice. Recheck your blood sugar after 15 minutes. If the level is still below 70 mg/dL repeat the process. If this occurs frequently, you should notify your healthcare provider.  You can find more information at [email or web address]-nutrition/[email or web address]   Lorain Baseman, PharmD [facility] [phone]

## 2024-05-31 NOTE — Progress Notes (Signed)
 "  05/31/2024 Name: Kathryn [name] MRN: [id number] DOB: 1970-07-10  Chief Complaint  Patient presents with   Diabetes   Hypertension    Kathryn [name] is a 54 y.o. year old female who presented for a telephone visit.   They were referred to the pharmacist by their PCP for assistance in managing diabetes. PMH includes HTN, asthma, IBS, dysphagia, gastroparesis with abnormal esophagram, T2DM with neuropathy, PTSD, schizoaffective disorder, BMI > 30. Hx of hospitalization for hypoglycemia (June 2025).   Subjective: Patient was last seen by PCP, Lorice Shall, NP on 12/0/125. A1C had increased to 9.0%. Patient reported it was due to increased soda intake. She reported adherence to basal/bolus insulin  regimen. She continued to report uncontrolled constipation and was re-prescribed linaclotide  290 mcg daily. She reported her Medicaid was pending. PCP messaged me prior to last appt to discuss restarting Trulicity , since patient is not achieving glycemic control on current regimen. At call on 05/10/24, patient was instructed to STOP Humalog  and restart Trulicity  0.75 mg weekly. Her Medicaid had been approved, so we also placed orders for her to restart CGM monitoring with Dexcom G7.   Today, patient reports doing well. She went to nutrition appt on 05/13/24 and they helped her set up her Dexcom sensors. Reports she has made some dietary changes. Having occasional low blood sugars in the 70s. Getting alarms from her phone/Dexcom. She would like a new referral to GI as she has noticed that she is having some difficulty swallowing again (needed esophageal dilation in the past). Denies nausea/vomiting/abdominal pain or symptoms of worsening gastroparesis since restarting GLP-1RA. Patient has appropriate pill bottles with her for review today.   Care Team: Primary Care Provider: Paseda, Folashade R, FNP ; Next Scheduled Visit: 08/05/24 Behavioral Health: established with new provider 02/08/24 after hospital  discharge   Medication Access/Adherence  Current Pharmacy:  [facility] [address], [address] [place] [address] Phone: [phone] Fax: [phone]   Patient reports affordability concerns with their medications: Yes - recently approved for Medicaid Patient reports access/transportation concerns to their pharmacy: No  Patient reports adherence concerns with their medications:  Yes  - patient has a history of stopping medications without reaching out to troubleshoot cost/adherence issues. Denies missed doses of Basaglar .   Diabetes:  Current medications: Basaglar  (or Lantus ) 21 units twice daily in the morning and evening (denies missed doses), Trulicity  0.75 mg weekly  Medications tried in the past: metformin - intolerance, restarted Trulicity  0.75 mg on 11/09/23 then d/c at last hospitalization- has at home still, Humalog   Current glucose readings: Dexcom G7 connected to phone. Has receiver as backup. Also has true metrix glucometer to use PRN.      Patient endorses hypoglycemic s/sx including dizziness, shakiness, sweatiness when BG drops into the 70s. Patient denies hyperglycemic symptoms including nocturia, polyuria, polydipsia, polyphagia, nocturia, blurred vision, neuropathy.  Current meal patterns:  Eating three times per day. Eats a sandwich before bedtime (whole wheat bread, turkey, cheese).  - Drinks: water  and diet tea, trying to avoid soda  Current physical activity: did not discuss today  Current medication access support: Medicaid  Hypertension:  Current medications: losartan  100 mg daily, amlodipine  5 mg daily Medications previously tried: lisinopril , amlodipine  10 mg and carvedilol  12.5 mg BID were recently discontinued during 11/23/23 hospitalization - she put remaining supply to the side at home.   Hyperlipidemia/ASCVD Risk Reduction  Current lipid lowering medications: atorvastatin  10 mg daily  Medications tried in the past: simvastatin  Antiplatelet regimen: aspirin  81 mg daily   ASCVD History: none. Normal echo (2018) and stress test (2022) Family History: Heart disease and aneurysm (brain) in Mother Risk Factors: T2DM, HTN  Clinical ASCVD: No  The ASCVD Risk score (Arnett DK, et al., 2019) failed to calculate for the following reasons:   The valid total cholesterol range is 130 to 320 mg/dL    Objective:  BP Readings from Last 3 Encounters:  05/31/24 128/76  05/03/24 130/85  04/25/24 115/67   Lab Results  Component Value Date   HGBA1C 9.0 (H) 05/04/2024   HGBA1C 8.5 (H) 01/29/2024   HGBA1C 8.0 (H) 11/24/2023    Lab Results  Component Value Date   CREATININE 0.88 04/17/2024   BUN 12 04/17/2024   NA 139 04/17/2024   K 3.8 04/17/2024   CL 101 04/17/2024   CO2 28 04/17/2024    Lab Results  Component Value Date   CHOL 111 01/29/2024   HDL 57 01/29/2024   LDLCALC 39 01/29/2024   TRIG 76 01/29/2024   CHOLHDL 1.9 01/29/2024    Medications Reviewed Today     Reviewed by Brinda Lorain SQUIBB, RPH-CPP (Pharmacist) on 05/31/24 at 1451  Med List Status: <None>   Medication Order Taking? Sig Documenting Provider Last Dose Status Informant  albuterol  (VENTOLIN  HFA) 108 (90 Base) MCG/ACT inhaler [id number]  Inhale 2 puffs into the lungs every 6 (six) hours as needed for wheezing or shortness of breath. Bacchus, Meade PEDLAR, FNP  Active Multiple Informants           Med Note DONETTE, TOBIAS DEL   Fri Jan 29, 2024  2:30 PM) Last dose was last month  amLODipine  (NORVASC ) 5 MG tablet [id number] Yes Take 1 tablet (5 mg total) by mouth daily. Paseda, Folashade R, FNP  Active   aspirin  EC 81 MG tablet [id number] Yes Take 81 mg by mouth daily. [provider]  Active Multiple Informants  atorvastatin  (LIPITOR) 10 MG tablet [id number] Yes Take 1 tablet (10 mg total) by mouth daily. Paseda, Folashade R, FNP  Active Multiple Informants  benztropine  (COGENTIN ) 1 MG tablet [id number] Yes  Take 1 tablet (1 mg total) by mouth at bedtime. Paseda, Folashade R, FNP  Active Multiple Informants  benztropine  (COGENTIN ) 1 MG tablet [id number]  Take 1 tablet (1 mg total) by mouth at bedtime.   Active   Blood Glucose Monitoring Suppl (TRUE METRIX METER) w/Device KIT [id number]  Use as directed to monitor blood sugar. Paseda, Folashade R, FNP  Active Multiple Informants  Cholecalciferol (VITAMIN D -3) 125 MCG (5000 UT) TABS [id number]  Take 2 tablets by mouth daily. [name], Gebreselassie W, MD  Active Multiple Informants    Discontinued 05/31/24 1450 (No longer needed (for PRN medications))   Continuous Glucose Sensor (DEXCOM G7 SENSOR) MISC [id number] Yes Change sensor every 10 days Paseda, Folashade R, FNP  Active   Dulaglutide  (TRULICITY ) 0.75 MG/0.5ML SOAJ [id number]  Inject 0.75 mg into the skin once a week. Paseda, Folashade R, FNP  Active   ferrous sulfate  325 (65 FE) MG tablet [id number]  Take 325 mg by mouth daily with breakfast. [provider]  Active Multiple Informants  gabapentin  (NEURONTIN ) 300 MG capsule [id number] Yes Take 1 capsule (300 mg total) by mouth daily. Paseda, Folashade R, FNP  Active Multiple Informants  Glucagon  (GVOKE HYPOPEN  2-PACK) 1 MG/0.2ML SOAJ [id number]  Inject 1 mg into the skin daily as needed. Paseda, Folashade R, FNP  Active Multiple  Informants  glucose blood (TRUE METRIX BLOOD GLUCOSE TEST) test strip [id number]  Use as instructed to monitor blood sugar twice daily Paseda, Folashade R, FNP  Active Multiple Informants  Insulin  Glargine (BASAGLAR  KWIKPEN) 100 UNIT/ML [id number] Yes Inject 21 Units into the skin 2 (two) times daily. Paseda, Folashade R, FNP  Active   Insulin  Pen Needle (PEN NEEDLES) 31G X 6 MM MISC [id number]  For 4 times a day insulin , 1 month supply. Diagnosis E11.65 Paseda, Folashade R, FNP  Active Multiple Informants  levothyroxine  (SYNTHROID ) 75 MCG tablet [id number] Yes Take 1 tablet (75 mcg total) by mouth daily before breakfast. Paseda,  Folashade R, FNP  Active   linaclotide  (LINZESS ) 290 MCG CAPS capsule [id number] Yes Take 1 capsule (290 mcg total) by mouth daily before breakfast. Paseda, Folashade R, FNP  Active   lithium  carbonate (LITHOBID ) 300 MG ER tablet [id number] Yes Take 1 tablet (300 mg total) by mouth daily. Lera Nancyann NOVAK, DO  Active   lithium  carbonate 300 MG capsule [id number]  Take 1 capsule (300 mg total) by mouth daily.   Active   lithium  carbonate 300 MG capsule [id number]  Take 1 capsule (300 mg total) by mouth daily.   Active   losartan  (COZAAR ) 100 MG tablet [id number]  Take 1 tablet (100 mg total) by mouth daily. Paseda, Folashade R, FNP  Active Multiple Informants  methocarbamol  (ROBAXIN ) 500 MG tablet [id number] Yes Take 1 tablet (500 mg total) by mouth 2 (two) times daily. Onesimo Oneil LABOR, MD  Active   Multiple Vitamin (MULTIVITAMIN) tablet [id number]  Take 1 tablet by mouth daily. [provider]  Active Multiple Informants  penicillin  v potassium (VEETID) 500 MG tablet [id number]  Take 2 tablets (1,000 mg total) by mouth daily for 1 day now, THEN 1 tablet (500 mg total) 4 (four) times daily for 10 days.   Active   risperiDONE  (RISPERDAL ) 3 MG tablet [id number] Yes Take 2 tablets (6 mg total) by mouth at bedtime.   Active   risperiDONE  (RISPERDAL ) 3 MG tablet [id number]  Take 2 tablets (6 mg total) by mouth at bedtime.   Active   rizatriptan  (MAXALT ) 10 MG tablet [id number]  Take 1 tablet (10 mg total) by mouth as needed for migraine. May repeat in 2 hours if needed Paseda, Folashade R, FNP  Active   TRUEplus Lancets 30G MISC [id number]  Use as directed to monitor blood sugar twice daily Paseda, Folashade R, FNP  Active Multiple Informants              Assessment/Plan:   Diabetes: - Currently controlled with 2 week TIR of 92% above goal > 70% and GMI 6.5% below goal <7%. Much improved compared to last A1C of 9.0% above goal < 7%. Patient is tolerating transition from Humalog  to Trulicity  well.  Given occasional AM and afternoon hypoglycemia, will decrease basal insulin  dose by ~20%. Patient prefers to continue BID dosing for now. Congratulated on changes in dietary habits. - Reviewed long term cardiovascular and renal outcomes of uncontrolled blood sugar - Reviewed goal A1c, goal fasting, and goal 2 hour post prandial glucose - Reviewed dietary modifications including reducing portion size of carbohydrate foods, increasing protein, increasing intake of non-starchy vegetables. Discussed avoiding soda and sugary beverages and increasing water  intake. - Reviewed lifestyle modifications including: increasing physical activity - Recommend to decrease Lantus  (insulin  glargine) to 16 units twice daily. Patient aware to notify clinic if she is having hypoglycemia with low BG < 70 mg/dL - Recommend  to continue Trulicity  0.75 mg weekly.  - Patient denies personal or family history of multiple endocrine neoplasia type 2, medullary thyroid  cancer; personal history of pancreatitis or gallbladder disease. - Recommend to check glucose continuously using Dexcom G7 CGM    Hypertension: - Currently controlled with clinic reading below goal < 130/80 mmHg. Fill hx appropriate. - Reviewed long term cardiovascular and renal outcomes of uncontrolled blood pressure - Reviewed appropriate blood pressure monitoring technique and reviewed goal blood pressure. Recommended to check home blood pressure and heart rate once daily - Recommend to continue losartan  100 mg daily, amlodipine  5 mg daily    Hyperlipidemia/ASCVD Risk Reduction: - Currently controlled with LDL-C of 39 mg/dL below goal < 70 mg/dL on atorvastatin  10 mg daily. Recommend to continue moderate intensity statin.  - Reviewed long term complications of uncontrolled cholesterol - Recommend to continue atorvastatin  10 mg daily   Follow Up Plan:  Pharmacist telephone 06/17/24, PCP 08/05/24   Lorain Baseman, PharmD [facility] [phone]  "

## 2024-05-31 NOTE — Therapy (Incomplete)
 " OUTPATIENT PHYSICAL THERAPY THORACOLUMBAR EVALUATION   Patient Name: Kathryn [name] MRN: [id number] DOB:[birth year], 54 y.o., female Today's Date: 05/31/2024  END OF SESSION:   Past Medical History:  Diagnosis Date   Anemia    Anxiety    Asthma    Depression    GERD (gastroesophageal reflux disease)    HLD (hyperlipidemia) 04/07/2019   Hypertension    Hypothyroidism, adult 04/07/2019   Neuropathy    Obesity (BMI 30.0-34.9) 04/07/2019   Schizoaffective disorder (HCC)    Type II diabetes mellitus, uncontrolled 04/27/2019   Past Surgical History:  Procedure Laterality Date   BACK SURGERY  09/06/2020   BIOPSY  05/03/2021   Procedure: BIOPSY;  Surgeon: Eartha Angelia Sieving, MD;  [facility];  Service: Gastroenterology;;   BREAST SURGERY Right    biopsy   CESAREAN SECTION     CHOLECYSTECTOMY  06/02/2012   Procedure: LAPAROSCOPIC CHOLECYSTECTOMY;  Surgeon: Oneil DELENA Budge, MD;  Location: [facility];  Service: General;  Laterality: N/A;  Attempted laparoscopic cholecystectomy   CHOLECYSTECTOMY  06/02/2012   Procedure: CHOLECYSTECTOMY;  Surgeon: Oneil DELENA Budge, MD;  Location: [facility];  Service: General;  Laterality: N/A;  converted to open at  0905   COLONOSCOPY WITH PROPOFOL  N/A 07/18/2020   prep was fair, one 4mm polyp (inflammatory) stool in descending colon, transverse and ascending, distal rectum and anal verge normal   ESOPHAGEAL DILATION  12/31/2018   Procedure: ESOPHAGEAL DILATION;  Surgeon: Golda Claudis PENNER, MD;  [facility];  Service: Endoscopy;;   ESOPHAGOGASTRODUODENOSCOPY (EGD) WITH PROPOFOL  N/A 08/24/2015   Procedure: ESOPHAGOGASTRODUODENOSCOPY (EGD) WITH PROPOFOL ;  Surgeon: Claudis PENNER Golda, MD;  [facility];  Service: Endoscopy;  Laterality: N/A;  1:10 - Ann to notify pt to arrive at 11:30   ESOPHAGOGASTRODUODENOSCOPY (EGD) WITH PROPOFOL  N/A 12/31/2018   normal, no abnormality to explain dysphagia, esophagus dilated.    ESOPHAGOGASTRODUODENOSCOPY (EGD) WITH PROPOFOL  N/A 05/03/2021   Procedure: ESOPHAGOGASTRODUODENOSCOPY (EGD) WITH PROPOFOL ;  Surgeon: Eartha Angelia Sieving, MD;  [facility];  Service: Gastroenterology;  Laterality: N/A;  1:20   FLEXIBLE SIGMOIDOSCOPY  07/17/2020   Procedure: FLEXIBLE SIGMOIDOSCOPY;  Surgeon: Eartha Angelia, Sieving, MD;  [facility];  Service: Gastroenterology;;   POLYPECTOMY  07/18/2020   Procedure: POLYPECTOMY INTESTINAL;  Surgeon: Eartha Angelia Sieving, MD;  [facility];  Service: Gastroenterology;;  ascending colon polyp;    SAVORY DILATION  05/03/2021   Procedure: SAVORY DILATION;  Surgeon: Eartha Angelia Sieving, MD;  [facility];  Service: Gastroenterology;;   tooth removal  2019   all teeth removed   TUBAL LIGATION     X3   Patient Active Problem List   Diagnosis Date Noted   Abdominal pain 04/25/2024   Health care maintenance 04/01/2024   Screening for colon cancer 04/01/2024   On sick leave from work 02/17/2024   Return to work evaluation 02/17/2024   Suicidal ideation 01/29/2024   Hospital discharge follow-up 01/08/2024   Frequency of urination 12/11/2023   Anemia 12/11/2023   Hypoglycemia 11/23/2023   Breast tenderness 10/23/2023   Soft tissue infection 07/24/2023   Tinnitus aurium, left 02/24/2023   Annual physical exam 02/24/2023   Bilateral calf pain 01/27/2023   Dysphagia 08/07/2022   Gastroparesis 08/07/2022   Trigger finger 03/05/2022   Encounter for general adult medical examination with abnormal findings 03/05/2022   Nausea 01/08/2022   Wheezing 01/08/2022   Migraine 10/31/2021   Depression, recurrent 10/31/2021  Great toe pain, right 10/03/2021   Chest pain 08/29/2021   Chronic tension-type headache, not intractable 08/29/2021   Encounter for examination following treatment at hospital 07/30/2021   Polyneuropathy 07/17/2021   Anxiety 07/01/2021   Diabetic autonomic neuropathy  associated with type 2 diabetes mellitus (HCC) 07/01/2021   Need for influenza vaccination 07/01/2021   Need for varicella vaccine 07/01/2021   Constipation 04/23/2021   Pain of upper abdomen 04/23/2021   Peripheral polyneuropathy 03/28/2021   Lumbar radiculopathy 12/13/2020   Body mass index (BMI) 30.0-30.9, adult 10/01/2020   Moderate persistent asthma 09/28/2020   Disc displacement, lumbar 08/27/2020   Elevated blood-pressure reading, without diagnosis of hypertension 08/27/2020   Non-adherence to medical treatment 08/02/2020   Functional dyspepsia 06/07/2020   Vitamin D  deficiency 07/15/2019   Uncontrolled type 2 diabetes mellitus with hyperglycemia, with long-term current use of insulin  (HCC) 04/27/2019   Hypothyroidism 04/07/2019   Obesity (BMI 30.0-34.9) 04/07/2019   Dyslipidemia, goal LDL below 70 04/07/2019   Abnormal esophagram 11/23/2018   Chronic GERD 10/08/2015   IBS (irritable bowel syndrome) 10/08/2015   Essential hypertension, benign 08/22/2015   Schizoaffective disorder, depressive type (HCC) 02/08/2015   PTSD (post-traumatic stress disorder) 02/08/2015    PCP: Paseda, Folashade R, FNP  REFERRING PROVIDER: Onesimo Oneil LABOR, MD  REFERRING DIAG: M54.50 (ICD-10-CM) - Lumbar pain  Rationale for Evaluation and Treatment: Rehabilitation  THERAPY DIAG:  No diagnosis found.  ONSET DATE: ***  SUBJECTIVE:                                                                                                                                                                                           SUBJECTIVE STATEMENT: ***  PERTINENT HISTORY:  DM Schizoprenia PTSD IBS Gastroparasis anxiety   PAIN:  Are you having pain? {OPRCPAIN:27236}  PRECAUTIONS: {Therapy precautions:24002}  RED FLAGS: {PT Red Flags:29287}   WEIGHT BEARING RESTRICTIONS: {Yes ***/No:24003}  FALLS:  Has patient fallen in last 6 months? {fallsyesno:27318}  LIVING ENVIRONMENT: Lives with:  {OPRC lives with:25569::lives with their family} Lives in: {Lives in:25570} Stairs: {opstairs:27293} Has following equipment at home: {Assistive devices:23999}  OCCUPATION: ***  PLOF: {PLOF:24004}  PATIENT GOALS: ***  NEXT MD VISIT: ***  OBJECTIVE:  Note: Objective measures were completed at Evaluation unless otherwise noted.  DIAGNOSTIC FINDINGS:  ***  PATIENT SURVEYS:  Modified Oswestry:  MODIFIED OSWESTRY DISABILITY SCALE  Date: *** Score  Pain intensity {ODI 1:32962}  2. Personal care (washing, dressing, etc.) {ODI 2:32963}  3. Lifting {ODI 3:32964}  4. Walking {ODI 4:32965}  5. Sitting {ODI 5:32966}  6. Standing {ODI 6:32967}  7. Sleeping {ODI 7:32968}  8.  Social Life {ODI 8:32969}  9. Traveling {ODI 9:32970}  10. Employment/ Homemaking {ODI 10:32971}  Total ***/50   Interpretation of scores: Score Category Description  0-20% Minimal Disability The patient can cope with most living activities. Usually no treatment is indicated apart from advice on lifting, sitting and exercise  21-40% Moderate Disability The patient experiences more pain and difficulty with sitting, lifting and standing. Travel and social life are more difficult and they may be disabled from work. Personal care, sexual activity and sleeping are not grossly affected, and the patient can usually be managed by conservative means  41-60% Severe Disability Pain remains the main problem in this group, but activities of daily living are affected. These patients require a detailed investigation  61-80% Crippled Back pain impinges on all aspects of the patients life. Positive intervention is required  81-100% Bed-bound These patients are either bed-bound or exaggerating their symptoms  Bluford FORBES Zoe DELENA Karon DELENA, et al. Surgery versus conservative management of stable thoracolumbar fracture: the PRESTO feasibility RCT. [place] ([place]): [employer]; 2021 Nov. [facility] Technology Assessment, No.  25.62.) Appendix 3, Oswestry Disability Index category descriptors. Available from: Findjewelers.cz  Minimally Clinically Important Difference (MCID) = 12.8%  COGNITION: Overall cognitive status: {cognition:24006}     SENSATION: {sensation:27233}  MUSCLE LENGTH: Hamstrings: Right *** deg; Left *** deg Debby test: Right *** deg; Left *** deg  POSTURE: {posture:25561}  PALPATION: ***  LUMBAR ROM:   AROM eval  Flexion   Extension   Right lateral flexion   Left lateral flexion   Right rotation   Left rotation    (Blank rows = not tested)  LOWER EXTREMITY ROM:     {AROM/PROM:27142}  Right eval Left eval  Hip flexion    Hip extension    Hip abduction    Hip adduction    Hip internal rotation    Hip external rotation    Knee flexion    Knee extension    Ankle dorsiflexion    Ankle plantarflexion    Ankle inversion    Ankle eversion     (Blank rows = not tested)  LOWER EXTREMITY MMT:    MMT Right eval Left eval  Hip flexion    Hip extension    Hip abduction    Hip adduction    Hip internal rotation    Hip external rotation    Knee flexion    Knee extension    Ankle dorsiflexion    Ankle plantarflexion    Ankle inversion    Ankle eversion     (Blank rows = not tested)  LUMBAR SPECIAL TESTS:  {lumbar special test:25242}  FUNCTIONAL TESTS:  {Functional tests:24029}  GAIT: Distance walked: *** Assistive device utilized: {Assistive devices:23999} Level of assistance: {Levels of assistance:24026} Comments: ***  TREATMENT DATE: 05/31/24 physical therapy evaluation and HEP instruction  PATIENT EDUCATION:  Education details: Patient educated on exam findings, POC, scope of PT, HEP, and ***. Person educated: Patient Education method: Explanation, Demonstration, and Handouts Education  comprehension: verbalized understanding, returned demonstration, verbal cues required, and tactile cues required  HOME EXERCISE PROGRAM: ***  ASSESSMENT:  CLINICAL IMPRESSION: Patient is a 54  y.o. female who was seen today for physical therapy evaluation and treatment for M54.50 (ICD-10-CM) - Lumbar pain.   OBJECTIVE IMPAIRMENTS: {opptimpairments:25111}.   ACTIVITY LIMITATIONS: {activitylimitations:27494}  PARTICIPATION LIMITATIONS: {participationrestrictions:25113}  PERSONAL FACTORS: {Personal factors:25162} are also affecting patient's functional outcome.   REHAB POTENTIAL: Good  CLINICAL DECISION MAKING: Evolving/moderate complexity  EVALUATION COMPLEXITY: Moderate   GOALS: Goals reviewed with patient? No  SHORT TERM GOALS: Target date: ***  patient will be independent with initial HEP and compliant with HEP 3-4 times a week   Baseline: Goal status: INITIAL  2.  Patient will report 50% improvement overall  Baseline:  Goal status: INITIAL  3.  *** Baseline:  Goal status: INITIAL  4.  *** Baseline:  Goal status: INITIAL  5.  *** Baseline:  Goal status: INITIAL  6.  *** Baseline:  Goal status: INITIAL  LONG TERM GOALS: Target date: ***  Patient will be independent in self management strategies to improve quality of life and functional outcomes.   Baseline:  Goal status: INITIAL  2.  Patient will report 70% improvement overall  Baseline:  Goal status: INITIAL  3.  Patient will improve  Modified Oswestry score by  points to demonstrate improved perceived function  Baseline:  Goal status: INITIAL  4.  *** Baseline:  Goal status: INITIAL  5.  *** Baseline:  Goal status: INITIAL  6.  *** Baseline:  Goal status: INITIAL  PLAN:  PT FREQUENCY: {rehab frequency:25116}  PT DURATION: {rehab duration:25117}  PLANNED INTERVENTIONS: 97164- PT Re-evaluation, 97110-Therapeutic exercises, 97530- Therapeutic activity, 97112- Neuromuscular  re-education, 97535- Self Care, 02859- Manual therapy, [id number]- Gait training, [id number]- Orthotic Fit/training, [id number]- Canalith repositioning, [id number]- Aquatic Therapy, 97760- Splinting, [id number]- Wound care (first 20 sq cm), 97598- Wound care (each additional 20 sq cm)Patient/Family education, Balance training, Stair training, Taping, Dry Needling, Joint mobilization, Joint manipulation, Spinal manipulation, Spinal mobilization, Scar mobilization, and DME instructions. SABRA  PLAN FOR NEXT SESSION: Review HEP and goals  2:10 PM, 05/31/2024 Tamico Mundo Small Florestine Carmical MPT [facility] [place] [phone] Ph:[phone]  "

## 2024-06-02 ENCOUNTER — Ambulatory Visit (HOSPITAL_COMMUNITY): Payer: MEDICAID | Attending: Orthopedic Surgery

## 2024-06-02 ENCOUNTER — Telehealth (HOSPITAL_COMMUNITY): Payer: Self-pay

## 2024-06-02 DIAGNOSIS — M5416 Radiculopathy, lumbar region: Secondary | ICD-10-CM | POA: Insufficient documentation

## 2024-06-02 DIAGNOSIS — M545 Low back pain, unspecified: Secondary | ICD-10-CM | POA: Insufficient documentation

## 2024-06-02 NOTE — Telephone Encounter (Signed)
 Spoke with patient who wants to reschedule her appt as she is having an MRI on Friday and MD wants to wait until after that.  Transferred call up front to reschedule.  8:45 AM, 06/02/2024 Kathryn [name] Xachary Hambly MPT [facility] [place] [phone]

## 2024-06-03 ENCOUNTER — Ambulatory Visit (HOSPITAL_COMMUNITY)
Admission: RE | Admit: 2024-06-03 | Discharge: 2024-06-03 | Disposition: A | Payer: MEDICAID | Source: Ambulatory Visit | Attending: Neurosurgery | Admitting: Neurosurgery

## 2024-06-03 DIAGNOSIS — M5126 Other intervertebral disc displacement, lumbar region: Secondary | ICD-10-CM | POA: Diagnosis present

## 2024-06-03 DIAGNOSIS — M4722 Other spondylosis with radiculopathy, cervical region: Secondary | ICD-10-CM | POA: Diagnosis present

## 2024-06-03 MED ORDER — GADOBUTROL 1 MMOL/ML IV SOLN
7.5000 mL | Freq: Once | INTRAVENOUS | Status: AC | PRN
  Administered 2024-06-03: 7.5 mL via INTRAVENOUS

## 2024-06-03 NOTE — Therapy (Signed)
 " OUTPATIENT PHYSICAL THERAPY THORACOLUMBAR EVALUATION   Patient Name: Kathryn [name] MRN: [id number] DOB:Dec 14, 1970, 54 y.o., female Today's Date: 06/03/2024  END OF SESSION:   Past Medical History:  Diagnosis Date   Anemia    Anxiety    Asthma    Depression    GERD (gastroesophageal reflux disease)    HLD (hyperlipidemia) 04/07/2019   Hypertension    Hypothyroidism, adult 04/07/2019   Neuropathy    Obesity (BMI 30.0-34.9) 04/07/2019   Schizoaffective disorder (HCC)    Type II diabetes mellitus, uncontrolled 04/27/2019   Past Surgical History:  Procedure Laterality Date   BACK SURGERY  09/06/2020   BIOPSY  05/03/2021   Procedure: BIOPSY;  Surgeon: Eartha Angelia Sieving, MD;  [facility];  Service: Gastroenterology;;   BREAST SURGERY Right    biopsy   CESAREAN SECTION     CHOLECYSTECTOMY  06/02/2012   Procedure: LAPAROSCOPIC CHOLECYSTECTOMY;  Surgeon: Oneil DELENA Budge, MD;  [facility]: [facility];  Service: General;  Laterality: N/A;  Attempted laparoscopic cholecystectomy   CHOLECYSTECTOMY  06/02/2012   Procedure: CHOLECYSTECTOMY;  Surgeon: Oneil DELENA Budge, MD;  [facility]: [facility];  Service: General;  Laterality: N/A;  converted to open at  9094   COLONOSCOPY WITH PROPOFOL  N/A 07/18/2020   prep was fair, one 4mm polyp (inflammatory) stool in descending colon, transverse and ascending, distal rectum and anal verge normal   ESOPHAGEAL DILATION  12/31/2018   Procedure: ESOPHAGEAL DILATION;  Surgeon: Golda Claudis PENNER, MD;  [facility];  Service: Endoscopy;;   ESOPHAGOGASTRODUODENOSCOPY (EGD) WITH PROPOFOL  N/A 08/24/2015   Procedure: ESOPHAGOGASTRODUODENOSCOPY (EGD) WITH PROPOFOL ;  Surgeon: Claudis PENNER Golda, MD;  [facility];  Service: Endoscopy;  Laterality: N/A;  1:10 - Ann to notify pt to arrive at 11:30   ESOPHAGOGASTRODUODENOSCOPY (EGD) WITH PROPOFOL  N/A 12/31/2018   normal, no abnormality to explain dysphagia, esophagus dilated.    ESOPHAGOGASTRODUODENOSCOPY (EGD) WITH PROPOFOL  N/A 05/03/2021   Procedure: ESOPHAGOGASTRODUODENOSCOPY (EGD) WITH PROPOFOL ;  Surgeon: Eartha Angelia Sieving, MD;  [facility];  Service: Gastroenterology;  Laterality: N/A;  1:20   FLEXIBLE SIGMOIDOSCOPY  07/17/2020   Procedure: FLEXIBLE SIGMOIDOSCOPY;  Surgeon: Eartha Angelia, Sieving, MD;  [facility];  Service: Gastroenterology;;   POLYPECTOMY  07/18/2020   Procedure: POLYPECTOMY INTESTINAL;  Surgeon: Eartha Angelia Sieving, MD;  [facility];  Service: Gastroenterology;;  ascending colon polyp;    SAVORY DILATION  05/03/2021   Procedure: SAVORY DILATION;  Surgeon: Eartha Angelia Sieving, MD;  [facility];  Service: Gastroenterology;;   tooth removal  2019   all teeth removed   TUBAL LIGATION     X3   Patient Active Problem List   Diagnosis Date Noted   Abdominal pain 04/25/2024   Health care maintenance 04/01/2024   Screening for colon cancer 04/01/2024   On sick leave from work 02/17/2024   Return to work evaluation 02/17/2024   Suicidal ideation 01/29/2024   Hospital discharge follow-up 01/08/2024   Frequency of urination 12/11/2023   Anemia 12/11/2023   Hypoglycemia 11/23/2023   Breast tenderness 10/23/2023   Soft tissue infection 07/24/2023   Tinnitus aurium, left 02/24/2023   Annual physical exam 02/24/2023   Bilateral calf pain 01/27/2023   Dysphagia 08/07/2022   Gastroparesis 08/07/2022   Trigger finger 03/05/2022   Encounter for general adult medical examination with abnormal findings 03/05/2022   Nausea 01/08/2022   Wheezing 01/08/2022   Migraine 10/31/2021   Depression, recurrent 10/31/2021  Great toe pain, right 10/03/2021   Chest pain 08/29/2021   Chronic tension-type headache, not intractable 08/29/2021   Encounter for examination following treatment at hospital 07/30/2021   Polyneuropathy 07/17/2021   Anxiety 07/01/2021   Diabetic autonomic neuropathy  associated with type 2 diabetes mellitus (HCC) 07/01/2021   Need for influenza vaccination 07/01/2021   Need for varicella vaccine 07/01/2021   Constipation 04/23/2021   Pain of upper abdomen 04/23/2021   Peripheral polyneuropathy 03/28/2021   Lumbar radiculopathy 12/13/2020   Body mass index (BMI) 30.0-30.9, adult 10/01/2020   Moderate persistent asthma 09/28/2020   Disc displacement, lumbar 08/27/2020   Elevated blood-pressure reading, without diagnosis of hypertension 08/27/2020   Non-adherence to medical treatment 08/02/2020   Functional dyspepsia 06/07/2020   Vitamin D  deficiency 07/15/2019   Uncontrolled type 2 diabetes mellitus with hyperglycemia, with long-term current use of insulin  (HCC) 04/27/2019   Hypothyroidism 04/07/2019   Obesity (BMI 30.0-34.9) 04/07/2019   Dyslipidemia, goal LDL below 70 04/07/2019   Abnormal esophagram 11/23/2018   Chronic GERD 10/08/2015   IBS (irritable bowel syndrome) 10/08/2015   Essential hypertension, benign 08/22/2015   Schizoaffective disorder, depressive type (HCC) 02/08/2015   PTSD (post-traumatic stress disorder) 02/08/2015    PCP: Paseda, Folashade R, FNP  REFERRING PROVIDER: Onesimo Oneil LABOR, MD  REFERRING DIAG: M54.50 (ICD-10-CM) - Lumbar pain  Rationale for Evaluation and Treatment: Rehabilitation  THERAPY DIAG:  No diagnosis found.  ONSET DATE: ***  SUBJECTIVE:                                                                                                                                                                                           SUBJECTIVE STATEMENT: ***  PERTINENT HISTORY:  ***  PAIN:  Are you having pain? {OPRCPAIN:27236}  PRECAUTIONS: {Therapy precautions:24002}  RED FLAGS: {PT Red Flags:29287}   WEIGHT BEARING RESTRICTIONS: {Yes ***/No:24003}  FALLS:  Has patient fallen in last 6 months? {fallsyesno:27318}  LIVING ENVIRONMENT: Lives with: {OPRC lives with:25569::lives with their  family} Lives in: {Lives in:25570} Stairs: {opstairs:27293} Has following equipment at home: {Assistive devices:23999}  OCCUPATION: ***  PLOF: {PLOF:24004}  PATIENT GOALS: ***  NEXT MD VISIT: ***  OBJECTIVE:  Note: Objective measures were completed at Evaluation unless otherwise noted.  DIAGNOSTIC FINDINGS:  ***  PATIENT SURVEYS:  {rehab surveys:24030}  COGNITION: Overall cognitive status: {cognition:24006}     SENSATION: {sensation:27233}  MUSCLE LENGTH: Hamstrings: Right *** deg; Left *** deg Debby test: Right *** deg; Left *** deg  POSTURE: {posture:25561}  PALPATION: ***  LUMBAR ROM:   AROM eval  Flexion   Extension   Right lateral flexion   Left  lateral flexion   Right rotation   Left rotation    (Blank rows = not tested)  LOWER EXTREMITY ROM:     {AROM/PROM:27142}  Right eval Left eval  Hip flexion    Hip extension    Hip abduction    Hip adduction    Hip internal rotation    Hip external rotation    Knee flexion    Knee extension    Ankle dorsiflexion    Ankle plantarflexion    Ankle inversion    Ankle eversion     (Blank rows = not tested)  LOWER EXTREMITY MMT:    MMT Right eval Left eval  Hip flexion    Hip extension    Hip abduction    Hip adduction    Hip internal rotation    Hip external rotation    Knee flexion    Knee extension    Ankle dorsiflexion    Ankle plantarflexion    Ankle inversion    Ankle eversion     (Blank rows = not tested)  LUMBAR SPECIAL TESTS:  {lumbar special test:25242}  FUNCTIONAL TESTS:  {Functional tests:24029}  GAIT: Distance walked: *** Assistive device utilized: {Assistive devices:23999} Level of assistance: {Levels of assistance:24026} Comments: ***  TREATMENT DATE: ***                                                                                                                                 PATIENT EDUCATION:  Education details: *** Person educated: {Person  educated:25204} Education method: {Education Method:25205} Education comprehension: {Education Comprehension:25206}  HOME EXERCISE PROGRAM: ***  ASSESSMENT:  CLINICAL IMPRESSION: Patient is a *** y.o. *** who was seen today for physical therapy evaluation and treatment for ***.   OBJECTIVE IMPAIRMENTS: {opptimpairments:25111}.   ACTIVITY LIMITATIONS: {activitylimitations:27494}  PARTICIPATION LIMITATIONS: {participationrestrictions:25113}  PERSONAL FACTORS: {Personal factors:25162} are also affecting patient's functional outcome.   REHAB POTENTIAL: Good  CLINICAL DECISION MAKING: Evolving/moderate complexity  EVALUATION COMPLEXITY: Moderate   GOALS: Goals reviewed with patient? No  SHORT TERM GOALS: Target date: ***  *** Baseline: Goal status: INITIAL  2.  *** Baseline:  Goal status: INITIAL  3.  *** Baseline:  Goal status: INITIAL  4.  *** Baseline:  Goal status: INITIAL  5.  *** Baseline:  Goal status: INITIAL  6.  *** Baseline:  Goal status: INITIAL  LONG TERM GOALS: Target date: ***  *** Baseline:  Goal status: INITIAL  2.  *** Baseline:  Goal status: INITIAL  3.  *** Baseline:  Goal status: INITIAL  4.  *** Baseline:  Goal status: INITIAL  5.  *** Baseline:  Goal status: INITIAL  6.  *** Baseline:  Goal status: INITIAL  PLAN:  PT FREQUENCY: {rehab frequency:25116}  PT DURATION: {rehab duration:25117}  PLANNED INTERVENTIONS: 97164- PT Re-evaluation, 97110-Therapeutic exercises, 97530- Therapeutic activity, [id number]- Neuromuscular re-education, 97535- Self Care, 02859- Manual therapy, [id number]- Gait training, [id number]- Orthotic Fit/training, [id number]- Canalith repositioning, [id number]-  Aquatic Therapy, [id number]- Splinting, 02402- Wound care (first 20 sq cm), 97598- Wound care (each additional 20 sq cm)Patient/Family education, Balance training, Stair training, Taping, Dry Needling, Joint mobilization, Joint manipulation, Spinal manipulation,  Spinal mobilization, Scar mobilization, and DME instructions. SABRA  PLAN FOR NEXT SESSION: Review HEP and goals   11:06 AM, 06/03/2024 Laurisa Sahakian Small Elward Nocera MPT [facility] [place] [phone] Ph:[phone]   "

## 2024-06-06 ENCOUNTER — Ambulatory Visit (HOSPITAL_COMMUNITY): Payer: MEDICAID

## 2024-06-06 ENCOUNTER — Other Ambulatory Visit: Payer: Self-pay

## 2024-06-06 DIAGNOSIS — M5416 Radiculopathy, lumbar region: Secondary | ICD-10-CM

## 2024-06-06 DIAGNOSIS — M545 Low back pain, unspecified: Secondary | ICD-10-CM | POA: Diagnosis present

## 2024-06-08 NOTE — Progress Notes (Unsigned)
 "  Patient ID: Kathryn [name], female   DOB: 10-Nov-1970, 54 y.o.   MRN: [id number]  Reason for Consult: No chief complaint on file.   Referred by Paseda, Folashade R, FNP  Subjective:     HPI Kathryn [name] is a 54 y.o. female presenting for evaluation of bilateral calf pain. *** She is currently compliant with aspirin  and statin  Past Medical History:  Diagnosis Date   Anemia    Anxiety    Asthma    Depression    GERD (gastroesophageal reflux disease)    HLD (hyperlipidemia) 04/07/2019   Hypertension    Hypothyroidism, adult 04/07/2019   Neuropathy    Obesity (BMI 30.0-34.9) 04/07/2019   Schizoaffective disorder (HCC)    Type II diabetes mellitus, uncontrolled 04/27/2019   Family History  Problem Relation Age of Onset   Hypertension Mother    Alcohol abuse Mother    Heart disease Mother    Kidney disease Mother    Aneurysm Mother        brain   Hypertension Father    Alcohol abuse Sister    Alcohol abuse Maternal Aunt    Cancer Maternal Aunt    Alcohol abuse Paternal Aunt    Alcohol abuse Maternal Grandmother    Alcohol abuse Maternal Grandfather    Hearing loss Daughter    Alcohol abuse Cousin    Stroke Other    Diabetes Other    Cancer Other    Seizures Other    Past Surgical History:  Procedure Laterality Date   BACK SURGERY  09/06/2020   BIOPSY  05/03/2021   Procedure: BIOPSY;  Surgeon: Eartha Angelia Sieving, MD;  Location: [facility];  Service: Gastroenterology;;   BREAST SURGERY Right    biopsy   CESAREAN SECTION     CHOLECYSTECTOMY  06/02/2012   Procedure: LAPAROSCOPIC CHOLECYSTECTOMY;  Surgeon: Oneil LABOR Budge, MD;  Location: [facility];  Service: General;  Laterality: N/A;  Attempted laparoscopic cholecystectomy   CHOLECYSTECTOMY  06/02/2012   Procedure: CHOLECYSTECTOMY;  Surgeon: Oneil LABOR Budge, MD;  Location: [facility];  Service: General;  Laterality: N/A;  converted to open at  9094   COLONOSCOPY WITH PROPOFOL  N/A 07/18/2020   prep was fair,  one 4mm polyp (inflammatory) stool in descending colon, transverse and ascending, distal rectum and anal verge normal   ESOPHAGEAL DILATION  12/31/2018   Procedure: ESOPHAGEAL DILATION;  Surgeon: Golda Claudis PENNER, MD;  Location: [facility];  Service: Endoscopy;;   ESOPHAGOGASTRODUODENOSCOPY (EGD) WITH PROPOFOL  N/A 08/24/2015   Procedure: ESOPHAGOGASTRODUODENOSCOPY (EGD) WITH PROPOFOL ;  Surgeon: Claudis PENNER Golda, MD;  Location: [facility];  Service: Endoscopy;  Laterality: N/A;  1:10 - Ann to notify pt to arrive at 11:30   ESOPHAGOGASTRODUODENOSCOPY (EGD) WITH PROPOFOL  N/A 12/31/2018   normal, no abnormality to explain dysphagia, esophagus dilated.   ESOPHAGOGASTRODUODENOSCOPY (EGD) WITH PROPOFOL  N/A 05/03/2021   Procedure: ESOPHAGOGASTRODUODENOSCOPY (EGD) WITH PROPOFOL ;  Surgeon: Eartha Angelia Sieving, MD;  Location: [facility];  Service: Gastroenterology;  Laterality: N/A;  1:20   FLEXIBLE SIGMOIDOSCOPY  07/17/2020   Procedure: FLEXIBLE SIGMOIDOSCOPY;  Surgeon: Eartha Angelia Sieving, MD;  Location: [facility];  Service: Gastroenterology;;   POLYPECTOMY  07/18/2020   Procedure: POLYPECTOMY INTESTINAL;  Surgeon: Eartha Angelia Sieving, MD;  Location: [facility];  Service: Gastroenterology;;  ascending colon polyp;    SAVORY DILATION  05/03/2021   Procedure: SAVORY DILATION;  Surgeon: Eartha Angelia Sieving, MD;  Location: [facility];  Service: Gastroenterology;;  tooth removal  2019   all teeth removed   TUBAL LIGATION     X3    Short Social History:  Social History   Tobacco Use   Smoking status: Never    Passive exposure: Never   Smokeless tobacco: Never  Substance Use Topics   Alcohol use: No    Alcohol/week: 0.0 standard drinks of alcohol    Allergies[1]  Current Outpatient Medications  Medication Sig Dispense Refill   albuterol  (VENTOLIN  HFA) 108 (90 Base) MCG/ACT inhaler Inhale 2 puffs into the lungs every 6 (six) hours as needed for wheezing or  shortness of breath. 8 g 2   amLODipine  (NORVASC ) 5 MG tablet Take 1 tablet (5 mg total) by mouth daily. 30 tablet 11   aspirin  EC 81 MG tablet Take 81 mg by mouth daily.     atorvastatin  (LIPITOR) 10 MG tablet Take 1 tablet (10 mg total) by mouth daily. 90 tablet 2   benztropine  (COGENTIN ) 1 MG tablet Take 1 tablet (1 mg total) by mouth at bedtime. 90 tablet 0   benztropine  (COGENTIN ) 1 MG tablet Take 1 tablet (1 mg total) by mouth at bedtime. 30 tablet 2   Blood Glucose Monitoring Suppl (TRUE METRIX METER) w/Device KIT Use as directed to monitor blood sugar. 1 kit 0   Cholecalciferol (VITAMIN D -3) 125 MCG (5000 UT) TABS Take 2 tablets by mouth daily. 30 tablet 1   Continuous Glucose Sensor (DEXCOM G7 SENSOR) MISC Change sensor every 10 days 9 each 3   Dulaglutide  (TRULICITY ) 0.75 MG/0.5ML SOAJ Inject 0.75 mg into the skin once a week. 2 mL 3   DULoxetine  (CYMBALTA ) 60 MG capsule Take 60 mg by mouth daily.     ferrous sulfate  325 (65 FE) MG tablet Take 325 mg by mouth daily with breakfast.     gabapentin  (NEURONTIN ) 300 MG capsule Take 1 capsule (300 mg total) by mouth daily. 90 capsule 2   Glucagon  (GVOKE HYPOPEN  2-PACK) 1 MG/0.2ML SOAJ Inject 1 mg into the skin daily as needed. 0.2 mL 1   glucose blood (TRUE METRIX BLOOD GLUCOSE TEST) test strip Use as instructed to monitor blood sugar twice daily 50 each 11   insulin  glargine (LANTUS ) 100 UNIT/ML Solostar Pen Inject 16 Units into the skin 2 (two) times daily. Maximum total daily dose of 42 units if instructed by provider. 30 mL 3   Insulin  Pen Needle (PEN NEEDLES) 31G X 6 MM MISC For 4 times a day insulin , 1 month supply. Diagnosis E11.65 100 each 0   levothyroxine  (SYNTHROID ) 75 MCG tablet Take 1 tablet (75 mcg total) by mouth daily before breakfast. 90 tablet 1   linaclotide  (LINZESS ) 290 MCG CAPS capsule Take 1 capsule (290 mcg total) by mouth daily before breakfast. 90 capsule 1   lithium  carbonate (LITHOBID ) 300 MG ER tablet Take 1 tablet  (300 mg total) by mouth daily. 30 tablet 0   lithium  carbonate 300 MG capsule Take 1 capsule (300 mg total) by mouth daily. 30 capsule 2   lithium  carbonate 300 MG capsule Take 1 capsule (300 mg total) by mouth daily. 30 capsule 2   losartan  (COZAAR ) 100 MG tablet Take 1 tablet (100 mg total) by mouth daily. 90 tablet 3   methocarbamol  (ROBAXIN ) 500 MG tablet Take 1 tablet (500 mg total) by mouth 2 (two) times daily. 60 tablet 0   Multiple Vitamin (MULTIVITAMIN) tablet Take 1 tablet by mouth daily.     penicillin  v potassium (VEETID) 500  MG tablet Take 2 tablets (1,000 mg total) by mouth daily for 1 day now, THEN 1 tablet (500 mg total) 4 (four) times daily for 10 days. 42 tablet 0   risperiDONE  (RISPERDAL ) 3 MG tablet Take 2 tablets (6 mg total) by mouth at bedtime. 60 tablet 2   risperiDONE  (RISPERDAL ) 3 MG tablet Take 2 tablets (6 mg total) by mouth at bedtime. 60 tablet 2   rizatriptan  (MAXALT ) 10 MG tablet Take 1 tablet (10 mg total) by mouth as needed for migraine. May repeat in 2 hours if needed 10 tablet 2   TRUEplus Lancets 30G MISC Use as directed to monitor blood sugar twice daily 100 each 3   No current facility-administered medications for this visit.    REVIEW OF SYSTEMS  All other systems were reviewed and are negative     Objective:  Objective   There were no vitals filed for this visit. There is no height or weight on file to calculate BMI.  Physical Exam General: no acute distress Cardiac: hemodynamically stable Abdomen: non-tender, no pulsatile mass*** Extremities: no edema, cyanosis or wounds*** Vascular:   Right: ***  Left: ***  Data: ABI ***  A1c reviewed, 9.0  CMP reviewed, creatinine 0.88     Assessment/Plan:   Kathryn [name] is a 54 y.o. female with ***  Recommendations to optimize cardiovascular risk: Abstinence from all tobacco products. Blood glucose control with goal A1c < 7%. Blood pressure control with goal blood pressure < 140/90  mmHg. Lipid reduction therapy with goal LDL-C <55 mg/dL  Aspirin  81mg  PO QD.  Atorvastatin  40-80mg  PO QD (or other high intensity statin therapy).   Norman GORMAN Serve MD [facility]     [1]  Allergies Allergen Reactions   Metformin Hcl Diarrhea   "

## 2024-06-10 ENCOUNTER — Encounter: Payer: Self-pay | Admitting: Vascular Surgery

## 2024-06-10 ENCOUNTER — Ambulatory Visit (INDEPENDENT_AMBULATORY_CARE_PROVIDER_SITE_OTHER): Payer: MEDICAID | Admitting: Vascular Surgery

## 2024-06-10 ENCOUNTER — Ambulatory Visit (HOSPITAL_COMMUNITY)
Admission: RE | Admit: 2024-06-10 | Discharge: 2024-06-10 | Disposition: A | Discharge status: Home / Self Care | Payer: MEDICAID | Source: Ambulatory Visit | Attending: Surgery | Admitting: Surgery

## 2024-06-10 VITALS — BP 135/87 | HR 91 | Temp 97.7°F | Resp 20 | Ht 60.0 in | Wt 167.4 lb

## 2024-06-10 DIAGNOSIS — E1140 Type 2 diabetes mellitus with diabetic neuropathy, unspecified: Secondary | ICD-10-CM

## 2024-06-10 DIAGNOSIS — I739 Peripheral vascular disease, unspecified: Secondary | ICD-10-CM | POA: Diagnosis present

## 2024-06-10 LAB — VAS US ABI WITH/WO TBI
Left ABI: 1.45
Right ABI: 1.25

## 2024-06-13 ENCOUNTER — Encounter (INDEPENDENT_AMBULATORY_CARE_PROVIDER_SITE_OTHER): Payer: Self-pay | Admitting: *Deleted

## 2024-06-15 ENCOUNTER — Ambulatory Visit (HOSPITAL_COMMUNITY): Payer: MEDICAID | Admitting: Physical Therapy

## 2024-06-15 DIAGNOSIS — M545 Low back pain, unspecified: Secondary | ICD-10-CM

## 2024-06-15 DIAGNOSIS — M5416 Radiculopathy, lumbar region: Secondary | ICD-10-CM

## 2024-06-15 NOTE — Therapy (Signed)
 " OUTPATIENT PHYSICAL THERAPY THORACOLUMBAR TREATMENT  Patient Name: Kathryn [name] MRN: [id number] DOB:December 17, 1970, 54 y.o., female Today's Date: 06/15/2024  END OF SESSION:  PT End of Session - 06/15/24 0904     Visit Number 2    Number of Visits 6    Date for Recertification  07/22/24    Authorization Type Vaya    Authorization Time Period vaya approved 7 visits 06/06/24-12/03/24    Authorization - Visit Number 2    Authorization - Number of Visits 7    Progress Note Due on Visit 6    PT Start Time 0904    PT Stop Time 0945    PT Time Calculation (min) 41 min    Activity Tolerance Patient tolerated treatment well    Behavior During Therapy [facility] for tasks assessed/performed          Past Medical History:  Diagnosis Date   Anemia    Anxiety    Asthma    Depression    GERD (gastroesophageal reflux disease)    HLD (hyperlipidemia) 04/07/2019   Hypertension    Hypothyroidism, adult 04/07/2019   Neuropathy    Obesity (BMI 30.0-34.9) 04/07/2019   Schizoaffective disorder (HCC)    Type II diabetes mellitus, uncontrolled 04/27/2019   Past Surgical History:  Procedure Laterality Date   BACK SURGERY  09/06/2020   BIOPSY  05/03/2021   Procedure: BIOPSY;  Surgeon: Eartha Angelia Sieving, MD;  [facility];  Service: Gastroenterology;;   BREAST SURGERY Right    biopsy   CESAREAN SECTION     CHOLECYSTECTOMY  06/02/2012   Procedure: LAPAROSCOPIC CHOLECYSTECTOMY;  Surgeon: Oneil DELENA Budge, MD;  Location: [facility];  Service: General;  Laterality: N/A;  Attempted laparoscopic cholecystectomy   CHOLECYSTECTOMY  06/02/2012   Procedure: CHOLECYSTECTOMY;  Surgeon: Oneil DELENA Budge, MD;  Location: [facility];  Service: General;  Laterality: N/A;  converted to open at  0905   COLONOSCOPY WITH PROPOFOL  N/A 07/18/2020   prep was fair, one 4mm polyp (inflammatory) stool in descending colon, transverse and ascending, distal rectum and anal verge normal   ESOPHAGEAL DILATION  12/31/2018    Procedure: ESOPHAGEAL DILATION;  Surgeon: Golda Claudis PENNER, MD;  [facility];  Service: Endoscopy;;   ESOPHAGOGASTRODUODENOSCOPY (EGD) WITH PROPOFOL  N/A 08/24/2015   Procedure: ESOPHAGOGASTRODUODENOSCOPY (EGD) WITH PROPOFOL ;  Surgeon: Claudis PENNER Golda, MD;  [facility];  Service: Endoscopy;  Laterality: N/A;  1:10 - Ann to notify pt to arrive at 11:30   ESOPHAGOGASTRODUODENOSCOPY (EGD) WITH PROPOFOL  N/A 12/31/2018   normal, no abnormality to explain dysphagia, esophagus dilated.   ESOPHAGOGASTRODUODENOSCOPY (EGD) WITH PROPOFOL  N/A 05/03/2021   Procedure: ESOPHAGOGASTRODUODENOSCOPY (EGD) WITH PROPOFOL ;  Surgeon: Eartha Angelia Sieving, MD;  [facility];  Service: Gastroenterology;  Laterality: N/A;  1:20   FLEXIBLE SIGMOIDOSCOPY  07/17/2020   Procedure: FLEXIBLE SIGMOIDOSCOPY;  Surgeon: Eartha Angelia Sieving, MD;  [facility];  Service: Gastroenterology;;   POLYPECTOMY  07/18/2020   Procedure: POLYPECTOMY INTESTINAL;  Surgeon: Eartha Angelia Sieving, MD;  [facility];  Service: Gastroenterology;;  ascending colon polyp;    SAVORY DILATION  05/03/2021   Procedure: SAVORY DILATION;  Surgeon: Eartha Angelia Sieving, MD;  [facility];  Service: Gastroenterology;;   tooth removal  2019   all teeth removed   TUBAL LIGATION     X3   Patient Active Problem List   Diagnosis Date Noted   Abdominal pain 04/25/2024   Health care  maintenance 04/01/2024   Screening for colon cancer 04/01/2024   On sick leave from work 02/17/2024   Return to work evaluation 02/17/2024   Suicidal ideation 01/29/2024   Hospital discharge follow-up 01/08/2024   Frequency of urination 12/11/2023   Anemia 12/11/2023   Hypoglycemia 11/23/2023   Breast tenderness 10/23/2023   Soft tissue infection 07/24/2023   Tinnitus aurium, left 02/24/2023   Annual physical exam 02/24/2023   Bilateral calf pain 01/27/2023   Dysphagia 08/07/2022    Gastroparesis 08/07/2022   Trigger finger 03/05/2022   Encounter for general adult medical examination with abnormal findings 03/05/2022   Nausea 01/08/2022   Wheezing 01/08/2022   Migraine 10/31/2021   Depression, recurrent 10/31/2021   Great toe pain, right 10/03/2021   Chest pain 08/29/2021   Chronic tension-type headache, not intractable 08/29/2021   Encounter for examination following treatment at hospital 07/30/2021   Polyneuropathy 07/17/2021   Anxiety 07/01/2021   Diabetic autonomic neuropathy associated with type 2 diabetes mellitus (HCC) 07/01/2021   Need for influenza vaccination 07/01/2021   Need for varicella vaccine 07/01/2021   Constipation 04/23/2021   Pain of upper abdomen 04/23/2021   Peripheral polyneuropathy 03/28/2021   Lumbar radiculopathy 12/13/2020   Body mass index (BMI) 30.0-30.9, adult 10/01/2020   Moderate persistent asthma 09/28/2020   Disc displacement, lumbar 08/27/2020   Elevated blood-pressure reading, without diagnosis of hypertension 08/27/2020   Non-adherence to medical treatment 08/02/2020   Functional dyspepsia 06/07/2020   Vitamin D  deficiency 07/15/2019   Uncontrolled type 2 diabetes mellitus with hyperglycemia, with long-term current use of insulin  (HCC) 04/27/2019   Hypothyroidism 04/07/2019   Obesity (BMI 30.0-34.9) 04/07/2019   Dyslipidemia, goal LDL below 70 04/07/2019   Abnormal esophagram 11/23/2018   Chronic GERD 10/08/2015   IBS (irritable bowel syndrome) 10/08/2015   Essential hypertension, benign 08/22/2015   Schizoaffective disorder, depressive type (HCC) 02/08/2015   PTSD (post-traumatic stress disorder) 02/08/2015    PCP: Juanice Thomes SAUNDERS, FNP  REFERRING PROVIDER: Onesimo Oneil LABOR, MD  REFERRING DIAG: M54.50 (ICD-10-CM) - Lumbar pain  Rationale for Evaluation and Treatment: Rehabilitation  THERAPY DIAG:  Low back pain, unspecified back pain laterality, unspecified chronicity, unspecified whether sciatica  present  Radiculopathy, lumbar region  ONSET DATE: back surgery 09/06/2020   SUBJECTIVE:                                                                                                                                                                                           SUBJECTIVE STATEMENT: Pt went to neurosurgeon and are going to try shots before surgery.  States she has a bulging disc in her lower  back.  States he's also going to order an MRI for her Rt shoulder as it has been hurting her also.  Currently with manageable pain.    Evaluation:  Pain for years; had surgery in 2022; felt better initially but has now gotten worse.  Went to Dr. Onesimo first and told her she should follow up with back surgeon; referred to PT as well.  Dr. Gillie ordered MRI of neck and back. MRI done Friday.   Waiting to hear back from [facility]'s office for MRI results; laying and standing prolonged periods of time bother her.  Her hips feel weak  PERTINENT HISTORY:  Anxiety, asthma, schizophrenia, DM  PAIN:  Are you having pain? Yes: NPRS scale: 9/10 Pain location: back, lower ribs down to glutes Pain description: burning Aggravating factors: lay down Relieving factors: Biofreeze, Tylenol   PRECAUTIONS: None  WEIGHT BEARING RESTRICTIONS: No  FALLS:  Has patient fallen in last 6 months? No   OCCUPATION: not working  PLOF: Independent  PATIENT GOALS: get my strength back; feel like my hips are weak  NEXT MD VISIT: PRN  OBJECTIVE:  Note: Objective measures were completed at Evaluation unless otherwise noted.  DIAGNOSTIC FINDINGS:  MR CERVICAL SPINE WITHOUT CONTRAST   CLINICAL HISTORY: Neck pain with right arm pain/numbness   TECHNIQUE: Multiplanar, multisequence MRI of the cervical spine was performed without intravenous contrast.   COMPARISON: No prior study.   FINDINGS: Mild straightening of the cervical alignment. Mild disc height loss C2-C3, C3/4. Multilevel minimal endplate  spurring. No fracture or subluxation. No paraspinal muscle edema.   Cervical cord is normal in course, caliber, and signal intensity. Cervicomedullary junction is normal.     C2-C3: No disc bulge. No facet hypertrophy. Patent central canal and neural foramina.   C3-C4: Mild posterior disc osteophyte ridge and mild uncovertebral hypertrophy. Small central protrusion. Mild flattening of the ventral thecal sac but the central canal measuring 6 to 7 mm anteroposteriorly (axial T2 image 16). Moderate left foraminal narrowing. Mild right foraminal narrowing.   C4-C5: Uncovertebral hypertrophy and posterior disc osteophyte ridging, asymmetrically worse on the right. No facet hypertrophy. Flattening the ventral thecal sac. Central canal measures 6 to 7 mm anteroposteriorly. Severe right and moderate left foraminal narrowing.   C5-C6: No disc bulge. No facet hypertrophy. Patent central canal and neural foramina.   C6-C7: Mild left uncovertebral hypertrophy. Mild left foraminal narrowing. Patent central canal and right foramen. No facet hypertrophy.   C7-T1: No disc bulge. No facet hypertrophy. Patent central canal and neural foramina.   IMPRESSION: 1. Mild straightening of the cervical alignment without fracture or subluxation.   2. C3/4: Moderate canal stenosis and moderate left foraminal narrowing. Mild right foraminal narrowing.   3. C4/5: Moderate canal stenosis. Severe right and moderate left foraminal narrowing.   4.  Full level by level details above.   Electronically signed by: Margretta Blanch MD 06/05/2024 02:22 PM EST RP Workstation: [id number]    MR LUMBAR SPINE WITHOUT THEN WITH IV CONTRAST   HISTORY: Low back pain. .   TECHNIQUE: Multiplanar, multisequence MR of the lumbar spine was performed before and after 7.5 mL Gadavist  intravenous contrast.   COMPARISON: Oct 17, 2020.   FINDINGS: Stable alignment with transitional anatomy at the lumbosacral junction.  Lowest fully formed vertebral body designated L5. Progression of now severe disc height loss L4/5 and increasing type I Modic endplate changes with marginal osteophyte formation. No significant subluxation. Multilevel disc dehydration at the remaining lumbar levels without significant disc  height loss. No fracture. Conus terminates opposite L1/2.   T11/12: Right foraminal protrusion contributing to moderate right foraminal narrowing. Patent central canal and left foramen. Mild facet hypertrophy.   T12/L1, L1/2: No disc bulge or significant facet hypertrophy. Patent central canal and neuroforamina.   L2/3: No disc bulge. Minor facet hypertrophy. Patent central canal and right foramen.   L3/4: Minimal annular bulge. Minor facet hypertrophy. Patent central canal. Patent neural foramina.   L4/5: Right hemilaminotomy. Circumferential disc osteophyte complex with a broad central, right paracentral protrusion measuring up to 5 to 6 mm anteroposteriorly (axial T2 image 30). Minor facet hypertrophy with ligamentous thickening. Moderate central canal stenosis. Moderate right subarticular stenosis. Moderate right and mild left foraminal narrowing.   L5/S1: Minor annular bulge. Moderate facet hypertrophy. Patent central canal. Mild bilateral foraminal narrowing, similar to prior.     IMPRESSION: 1.  Stable alignment with transitional anatomy at the lumbosacral junction.   2. L4/5: Progression of disc height loss with increasing type I Modic endplate changes. Broad central, right paracentral protrusion with moderate central canal stenosis, moderate right subarticular stenosis, and moderate right foraminal narrowing.   3. T11/12: Right foraminal protrusion contributing to moderate right foraminal narrowing.   4.  Full level by level details above.   Electronically signed by: Margretta Blanch MD 06/05/2024 02:33 PM EST RP Workstation: [id number]    PATIENT SURVEYS:  Modified Oswestry:   MODIFIED OSWESTRY DISABILITY SCALE  Date:  06/06/2024  Total 27/50; 54%   Interpretation of scores: Score Category Description  0-20% Minimal Disability The patient can cope with most living activities. Usually no treatment is indicated apart from advice on lifting, sitting and exercise  21-40% Moderate Disability The patient experiences more pain and difficulty with sitting, lifting and standing. Travel and social life are more difficult and they may be disabled from work. Personal care, sexual activity and sleeping are not grossly affected, and the patient can usually be managed by conservative means  41-60% Severe Disability Pain remains the main problem in this group, but activities of daily living are affected. These patients require a detailed investigation  61-80% Crippled Back pain impinges on all aspects of the patients life. Positive intervention is required  81-100% Bed-bound These patients are either bed-bound or exaggerating their symptoms  Bluford FORBES Zoe DELENA Karon DELENA, et al. Surgery versus conservative management of stable thoracolumbar fracture: the PRESTO feasibility RCT. [place] ([place]): [employer]; 2021 Nov. [facility] Technology Assessment, No. 25.62.) Appendix 3, Oswestry Disability Index category descriptors. Available from: Findjewelers.cz  Minimally Clinically Important Difference (MCID) = 12.8%  COGNITION: Overall cognitive status: Within functional limits for tasks assessed     MUSCLE LENGTH: Hamstrings:   POSTURE: rounded shoulders, forward head, and increased lumbar lordosis  PALPATION: Grossly tender lumbar spine  LUMBAR ROM: * painful  AROM eval  Flexion Fingertips to 2 past distal pole of patella*  Extension 40% available *  Right lateral flexion Fingertips to 5 above knee joint line  Left lateral flexion Fingertips to 5 above knee joint line  Right rotation   Left rotation    (Blank rows = not  tested)  LOWER EXTREMITY ROM:     Active  Right eval Left eval  Hip flexion    Hip extension    Hip abduction    Hip adduction    Hip internal rotation    Hip external rotation    Knee flexion    Knee extension    Ankle dorsiflexion  Ankle plantarflexion    Ankle inversion    Ankle eversion     (Blank rows = not tested)  LOWER EXTREMITY MMT:    MMT Right eval Left eval  Hip flexion 4 4  Hip extension Prone 3+ Prone 3+  Hip abduction    Hip adduction    Hip internal rotation    Hip external rotation    Knee flexion 4 4  Knee extension 4 4+  Ankle dorsiflexion 4 4  Ankle plantarflexion    Ankle inversion    Ankle eversion     (Blank rows = not tested)   FUNCTIONAL TESTS:  5 times sit to stand: 27.23 sec using hands to push up to standing  GAIT: Distance walked: 50 ft in clinic Assistive device utilized: None Level of assistance: Modified independence Comments: decreased gait speed  TREATMENT DATE: 06/15/2024 Goal review Standing :  lumbar extension 10X Prone:  testing of hamstrings and glute MMT  POE 2 minutes  Heelsqueezes 10x5 Supine:  decompression 1-5, 5X each  TrA contraction 10X5  Bridge 10X each  SLR 10 X each with core stab  06/06/2024 physical therapy evaluation and HEP instruction                                                                                                                                 PATIENT EDUCATION:  Education details: Patient educated on exam findings, POC, scope of PT, HEP, and what to expect next visit. Person educated: Patient Education method: Explanation, Demonstration, and Handouts Education comprehension: verbalized understanding, returned demonstration, verbal cues required, and tactile cues required  HOME EXERCISE PROGRAM: Access Code: [email or web address] URL: [email or web address] Date: 06/06/2024 Prepared by: [facility] - Rehab Exercises - Seated Transversus Abdominis Bracing  - 2 x daily - 7 x  weekly - 1 sets - 10 reps - 5 sec hold - Seated Hamstring Stretch  - 2 x daily - 7 x weekly - 1 sets - 5 reps  06/15/24:  decompression 1-5, 5X5 each  ASSESSMENT:  CLINICAL IMPRESSION: Reviewed goals and POC moving forward.  Tested hamstring and glut strength first in prone with min-mod weakness.  Educated on extension and added prone on elbows with pt reporting feeling better.  Encouraged her to do this at home as well as standing extension throughout the day.  Decompression exercises began following education on core stabilization.  Pt able to demonstrated all with good form and minimal cues.  Added decompression exercises to HEP.  Pt reported no change in symptoms at end of session.   Patient will benefit from skilled physical therapy services to address these deficits to reduce pain and improve level of function with ADLs and functional mobility tasks.   OBJECTIVE IMPAIRMENTS: Abnormal gait, decreased activity tolerance, decreased ROM, decreased strength, increased fascial restrictions, impaired perceived functional ability, and pain.   ACTIVITY LIMITATIONS: carrying, lifting, bending, sitting, standing, squatting, sleeping, and locomotion level  PARTICIPATION LIMITATIONS: meal prep,  cleaning, laundry, shopping, and community activity  PERSONAL FACTORS: 1 comorbidity: DM are also affecting patient's functional outcome.   REHAB POTENTIAL: Good  CLINICAL DECISION MAKING: Evolving/moderate complexity  EVALUATION COMPLEXITY: Moderate   GOALS: Goals reviewed with patient? Yes  SHORT TERM GOALS: Target date: 06/27/2024  patient will be independent with initial HEP and compliant with HEP 3-4 times a week   Baseline: Goal status: IN PROGRESS  2.  Patient will report 50% improvement overall  Baseline:  Goal status: IN PROGRESS   LONG TERM GOALS: Target date: 07/22/2024  Patient will be independent in self management strategies to improve quality of life and functional  outcomes.  Baseline:  Goal status: IN PROGRESS  2.  Patient will report 70% improvement overall  Baseline:  Goal status: IN PROGRESS  3.  Patient will improve Modified Oswestry score by 5 points to demonstrate improved perceived function  Baseline: 27/50 Goal status: IN PROGRESS  4.  Patient will improve 5 times sit to stand score to 17 sec or less to demonstrate improved functional mobility and increased leg strength.    Baseline: 27.23 sec using hands to push up Goal status: IN PROGRESS  5.   Patient will increase bilateral leg MMT's to 4+ to 5/5 to allow navigation of steps without gait deviation or loss of balance  Baseline: see above Goal status: IN PROGRESS   PLAN:  PT FREQUENCY: 1-2x/week  PT DURATION: 6 weeks  PLANNED INTERVENTIONS: [id number]- PT Re-evaluation, 97110-Therapeutic exercises, 97530- Therapeutic activity, 97112- Neuromuscular re-education, 97535- Self Care, [id number]- Manual therapy, [id number]- Gait training, [phone]- Orthotic Fit/training, [phone]- Canalith repositioning, [id number]- Aquatic Therapy, 97760- Splinting, [id number]- Wound care (first 20 sq cm), 97598- Wound care (each additional 20 sq cm)Patient/Family education, Balance training, Stair training, Taping, Dry Needling, Joint mobilization, Joint manipulation, Spinal manipulation, Spinal mobilization, Scar mobilization, and DME instructions. SABRA  PLAN FOR NEXT SESSION: continue with decompression exercise, postural strengthening,  core strengthening, and spinal mobility.  Greig KATHEE Fuse, PTA/CLT [facility] Ph: [phone]  9:05 AM, 06/15/24    "

## 2024-06-16 ENCOUNTER — Other Ambulatory Visit: Payer: Self-pay

## 2024-06-16 ENCOUNTER — Other Ambulatory Visit: Payer: MEDICAID

## 2024-06-16 DIAGNOSIS — E1165 Type 2 diabetes mellitus with hyperglycemia: Secondary | ICD-10-CM

## 2024-06-16 DIAGNOSIS — Z794 Long term (current) use of insulin: Secondary | ICD-10-CM

## 2024-06-16 NOTE — Progress Notes (Signed)
 "  06/16/2024 Name: PENIEL BIEL MRN: [id number] DOB: 02-Dec-1970  Chief Complaint  Patient presents with   Diabetes    Kathryn [name] is a 54 y.o. year old female who presented for a telephone visit.   They were referred to the pharmacist by their PCP for assistance in managing diabetes. PMH includes HTN, asthma, IBS, dysphagia, gastroparesis with abnormal esophagram, T2DM with neuropathy, PTSD, schizoaffective disorder, BMI > 30. Hx of hospitalization for hypoglycemia (June 2025).   Subjective: Patient was last seen by PCP, Lorice Shall, NP on 12/0/125. A1C had increased to 9.0%. Patient reported it was due to increased soda intake. She reported adherence to basal/bolus insulin  regimen. She continued to report uncontrolled constipation and was re-prescribed linaclotide  290 mcg daily. She reported her Medicaid was pending. PCP messaged me prior to last appt to discuss restarting Trulicity , since patient is not achieving glycemic control on current regimen. At call on 05/10/24, patient was instructed to STOP Humalog  and restart Trulicity  0.75 mg weekly. Her Medicaid had been approved, so we also placed orders for her to restart CGM monitoring with Dexcom G7. At pharmacy follow-up on 05/31/24, BG were much improved with TIR of 92%, but patient was having some occasional AM and afternoon hypoglycemia, so we reduced her basal insulin  by 20%.   Today, patient reports doing well. Denies nausea, vomiting, abdominal pain, or worsened constipation with Trulicity . She reports hypoglycemia has improved with decrease in insulin  dose. She is noticing BG spikes when she has candy, like peppermints. Still avoiding sugary drinks. CGM are working well. Did have one day that it had an error. States her family has connected her readings to their phone.   Care Team: Primary Care Provider: Paseda, Folashade R, FNP ; Next Scheduled Visit: 08/05/24 Behavioral Health: established with new provider 02/08/24 after hospital  discharge   Medication Access/Adherence  Current Pharmacy:  [facility] [address], [address] [place] [address] Phone: [phone] Fax: [phone]   Patient reports affordability concerns with their medications: Yes - recently approved for Medicaid Patient reports access/transportation concerns to their pharmacy: No  Patient reports adherence concerns with their medications:  Yes  - patient has a history of stopping medications without reaching out to troubleshoot cost/adherence issues. Denies missed doses of Basaglar .   Diabetes:  Current medications: Basaglar  (or Lantus ) 16 units twice daily in the morning and evening (denies missed doses), Trulicity  0.75 mg weekly (Tuesdays)  Medications tried in the past: metformin - intolerance, Humalog  (able to d/c when starting GLP-1)  Current glucose readings: Dexcom G7 connected to phone. Has receiver as backup. Also has true metrix glucometer to use PRN.  Date of download: 06/16/24     Patient denies hypoglycemic s/sx including dizziness, shakiness, sweatiness since decrease in insulin  dose. Patient denies hyperglycemic symptoms including nocturia, polyuria, polydipsia, polyphagia, nocturia, blurred vision, neuropathy.  Current meal patterns:  Eating three times per day. Eats a sandwich before bedtime (whole wheat bread, turkey, cheese). Has started eating snack of chia seeds mixed with water . - Drinks: water  and diet tea, trying to avoid soda  Current physical activity: did not discuss today  Current medication access support: Medicaid  Hypertension:  Current medications: losartan  100 mg daily, amlodipine  5 mg daily Medications previously tried: lisinopril , amlodipine  10 mg and carvedilol  12.5 mg BID were recently discontinued during 11/23/23 hospitalization - she put remaining supply to the side at home.   Reports BP at clinic visit yesterday 122/82.  She has not checked  at home recently.  Hyperlipidemia/ASCVD Risk Reduction  Current lipid lowering medications: atorvastatin  10 mg daily  Medications tried in the past: simvastatin  Antiplatelet regimen: aspirin  81 mg daily   ASCVD History: none. Normal echo (2018) and stress test (2022) Family History: Heart disease and aneurysm (brain) in Mother Risk Factors: T2DM, HTN  Clinical ASCVD: No  The ASCVD Risk score (Arnett DK, et al., 2019) failed to calculate for the following reasons:   The valid total cholesterol range is 130 to 320 mg/dL    Objective:  BP Readings from Last 3 Encounters:  06/10/24 135/87  05/31/24 128/76  05/03/24 130/85   Lab Results  Component Value Date   HGBA1C 9.0 (H) 05/04/2024   HGBA1C 8.5 (H) 01/29/2024   HGBA1C 8.0 (H) 11/24/2023    Lab Results  Component Value Date   CREATININE 0.88 04/17/2024   BUN 12 04/17/2024   NA 139 04/17/2024   K 3.8 04/17/2024   CL 101 04/17/2024   CO2 28 04/17/2024    Lab Results  Component Value Date   CHOL 111 01/29/2024   HDL 57 01/29/2024   LDLCALC 39 01/29/2024   TRIG 76 01/29/2024   CHOLHDL 1.9 01/29/2024    Medications Reviewed Today     Reviewed by Brinda Lorain SQUIBB, RPH-CPP (Pharmacist) on 06/16/24 at 0944  Med List Status: <None>   Medication Order Taking? Sig Documenting Provider Last Dose Status Informant  albuterol  (VENTOLIN  HFA) 108 (90 Base) MCG/ACT inhaler [id number]  Inhale 2 puffs into the lungs every 6 (six) hours as needed for wheezing or shortness of breath. Bacchus, Meade PEDLAR, FNP  Active Multiple Informants           Med Note DONETTE, TOBIAS DEL   Fri Jan 29, 2024  2:30 PM) Last dose was last month  amLODipine  (NORVASC ) 5 MG tablet [id number] Yes Take 1 tablet (5 mg total) by mouth daily. Paseda, Folashade R, FNP  Active   aspirin  EC 81 MG tablet [id number] Yes Take 81 mg by mouth daily. [provider]  Active Multiple Informants  atorvastatin  (LIPITOR) 10 MG tablet [id number] Yes Take 1 tablet (10 mg  total) by mouth daily. Paseda, Folashade R, FNP  Active Multiple Informants    Discontinued 06/16/24 0941 (Duplicate) benztropine  (COGENTIN ) 1 MG tablet [id number] Yes Take 1 tablet (1 mg total) by mouth at bedtime.   Active   Blood Glucose Monitoring Suppl (TRUE METRIX METER) w/Device KIT [id number]  Use as directed to monitor blood sugar. Paseda, Folashade R, FNP  Active Multiple Informants  Cholecalciferol (VITAMIN D -3) 125 MCG (5000 UT) TABS [id number]  Take 2 tablets by mouth daily. [name], Gebreselassie W, MD  Active Multiple Informants  Continuous Glucose Sensor (DEXCOM G7 SENSOR) MISC [id number] Yes Change sensor every 10 days Paseda, Folashade R, FNP  Active   Dulaglutide  (TRULICITY ) 0.75 MG/0.5ML EMMANUEL [id number] Yes Inject 0.75 mg into the skin once a week. Paseda, Folashade R, FNP  Active   DULoxetine  (CYMBALTA ) 60 MG capsule [id number]  Take 60 mg by mouth daily. [provider]  Active   ferrous sulfate  325 (65 FE) MG tablet [id number]  Take 325 mg by mouth daily with breakfast. [provider]  Active Multiple Informants  gabapentin  (NEURONTIN ) 300 MG capsule [id number] Yes Take 1 capsule (300 mg total) by mouth daily. Paseda, Folashade R, FNP  Active Multiple Informants  Glucagon  (GVOKE HYPOPEN  2-PACK) 1 MG/0.2ML SOAJ [id number]  Inject 1 mg into the skin daily  as needed. Paseda, Folashade R, FNP  Active Multiple Informants  glucose blood (TRUE METRIX BLOOD GLUCOSE TEST) test strip [id number]  Use as instructed to monitor blood sugar twice daily Paseda, Folashade R, FNP  Active Multiple Informants  insulin  glargine (LANTUS ) 100 UNIT/ML Solostar Pen [id number] Yes Inject 16 Units into the skin 2 (two) times daily. Maximum total daily dose of 42 units if instructed by provider. Paseda, Folashade R, FNP  Active   Insulin  Pen Needle (PEN NEEDLES) 31G X 6 MM MISC [id number]  For 4 times a day insulin , 1 month supply. Diagnosis E11.65 Paseda, Folashade R, FNP  Active Multiple Informants   levothyroxine  (SYNTHROID ) 75 MCG tablet [id number] Yes Take 1 tablet (75 mcg total) by mouth daily before breakfast. Paseda, Folashade R, FNP  Active   linaclotide  (LINZESS ) 290 MCG CAPS capsule [id number] Yes Take 1 capsule (290 mcg total) by mouth daily before breakfast. Paseda, Folashade R, FNP  Active     Discontinued 06/16/24 0941 (Duplicate)   lithium  carbonate 300 MG capsule [id number]  Take 1 capsule (300 mg total) by mouth daily.   Active   lithium  carbonate 300 MG capsule [id number] Yes Take 1 capsule (300 mg total) by mouth daily.   Active   losartan  (COZAAR ) 100 MG tablet [id number] Yes Take 1 tablet (100 mg total) by mouth daily. Paseda, Folashade R, FNP  Active Multiple Informants  methocarbamol  (ROBAXIN ) 500 MG tablet [id number]  Take 1 tablet (500 mg total) by mouth 2 (two) times daily. Onesimo Oneil LABOR, MD  Active   Multiple Vitamin (MULTIVITAMIN) tablet [id number]  Take 1 tablet by mouth daily. [provider]  Active Multiple Informants  risperiDONE  (RISPERDAL ) 3 MG tablet [id number] Yes Take 2 tablets (6 mg total) by mouth at bedtime.   Active   risperiDONE  (RISPERDAL ) 3 MG tablet [id number]  Take 2 tablets (6 mg total) by mouth at bedtime.   Active   rizatriptan  (MAXALT ) 10 MG tablet [id number]  Take 1 tablet (10 mg total) by mouth as needed for migraine. May repeat in 2 hours if needed Paseda, Folashade R, FNP  Active   TRUEplus Lancets 30G MISC [id number]  Use as directed to monitor blood sugar twice daily Paseda, Folashade R, FNP  Active Multiple Informants              Assessment/Plan:   Diabetes: - Currently controlled with 2 week TIR of 89% above goal > 70% and GMI 6.7% below goal <7%. Much improved compared to last A1C of 9.0% above goal < 7%. Patient is tolerating transition from Humalog  to Trulicity  well. Hypoglycemia resolved with decrease in basal insulin . Given hx of gastroparesis and difficulty swallowing, will not titrate Trulicity  at this time. Continued to  encourage adherence to healthy diet.  - Reviewed long term cardiovascular and renal outcomes of uncontrolled blood sugar - Reviewed goal A1c, goal fasting, and goal 2 hour post prandial glucose - Reviewed dietary modifications including reducing portion size of carbohydrate foods, increasing protein, increasing intake of non-starchy vegetables. Discussed avoiding soda and sugary beverages, sweets, and increasing water  intake. - Reviewed lifestyle modifications including: increasing physical activity - Recommend to continue Lantus  (insulin  glargine) to 16 units twice daily. Patient aware to notify clinic if she is having hypoglycemia with low BG < 70 mg/dL - Recommend to continue Trulicity  0.75 mg weekly.  - Recommend to check glucose continuously using Dexcom G7 CGM. Will plan to switch to 15 day sensor at follow-up.    Hypertension: - Currently controlled  with clinic reading below goal < 130/80 mmHg. Fill hx appropriate. - Reviewed long term cardiovascular and renal outcomes of uncontrolled blood pressure - Reviewed appropriate blood pressure monitoring technique and reviewed goal blood pressure. Recommended to check home blood pressure and heart rate once daily - Recommend to continue losartan  100 mg daily, amlodipine  5 mg daily    Hyperlipidemia/ASCVD Risk Reduction: - Currently controlled with LDL-C of 39 mg/dL below goal < 70 mg/dL on atorvastatin  10 mg daily. Recommend to continue moderate intensity statin.  - Reviewed long term complications of uncontrolled cholesterol - Recommend to continue atorvastatin  10 mg daily   Follow Up Plan:  Pharmacist telephone 07/22/24, PCP 08/05/24   Lorain Baseman, PharmD [facility] [phone]  "

## 2024-06-17 ENCOUNTER — Other Ambulatory Visit: Payer: Self-pay

## 2024-06-22 ENCOUNTER — Telehealth (HOSPITAL_COMMUNITY): Payer: Self-pay

## 2024-06-22 ENCOUNTER — Ambulatory Visit (HOSPITAL_COMMUNITY): Payer: MEDICAID

## 2024-06-22 NOTE — Telephone Encounter (Signed)
 No show #1; called and left message regarding missed appointment and reminding up next appointment 06/29/24 @ 9:00  9:18 AM, 06/22/24 Og[place] Handlin Small Marg Macmaster MPT [facility] [place] [phone] Ph:[phone]

## 2024-06-23 ENCOUNTER — Institutional Professional Consult (permissible substitution) (INDEPENDENT_AMBULATORY_CARE_PROVIDER_SITE_OTHER): Payer: MEDICAID | Admitting: Otolaryngology

## 2024-06-29 ENCOUNTER — Ambulatory Visit (HOSPITAL_COMMUNITY): Payer: MEDICAID | Admitting: Physical Therapy

## 2024-06-29 DIAGNOSIS — M545 Low back pain, unspecified: Secondary | ICD-10-CM

## 2024-06-29 DIAGNOSIS — M5416 Radiculopathy, lumbar region: Secondary | ICD-10-CM

## 2024-06-29 NOTE — Therapy (Signed)
 " OUTPATIENT PHYSICAL THERAPY THORACOLUMBAR TREATMENT  Patient Name: Kathryn [name] MRN: [id number] DOB:10/23/1970, 54 y.o., female Today's Date: 06/29/2024  END OF SESSION:  PT End of Session - 06/29/24 0945     Visit Number 3    Number of Visits 6    Date for Recertification  07/22/24    Authorization Type V[employer]    Authorization Time Period vaya approved 7 visits 06/06/24-12/03/24    Authorization - Visit Number 2    Authorization - Number of Visits 7    Progress Note Due on Visit 6    PT Start Time 0905    PT Stop Time 0945    PT Time Calculation (min) 40 min    Activity Tolerance Patient tolerated treatment well    Behavior During Therapy [facility] for tasks assessed/performed           Past Medical History:  Diagnosis Date   Anemia    Anxiety    Asthma    Depression    GERD (gastroesophageal reflux disease)    HLD (hyperlipidemia) 04/07/2019   Hypertension    Hypothyroidism, adult 04/07/2019   Neuropathy    Obesity (BMI 30.0-34.9) 04/07/2019   Schizoaffective disorder (HCC)    Type II diabetes mellitus, uncontrolled 04/27/2019   Past Surgical History:  Procedure Laterality Date   BACK SURGERY  09/06/2020   BIOPSY  05/03/2021   Procedure: BIOPSY;  Surgeon: Eartha Angelia Sieving, MD;  [facility];  Service: Gastroenterology;;   BREAST SURGERY Right    biopsy   CESAREAN SECTION     CHOLECYSTECTOMY  06/02/2012   Procedure: LAPAROSCOPIC CHOLECYSTECTOMY;  Surgeon: Oneil DELENA Budge, MD;  Location: [facility];  Service: General;  Laterality: N/A;  Attempted laparoscopic cholecystectomy   CHOLECYSTECTOMY  06/02/2012   Procedure: CHOLECYSTECTOMY;  Surgeon: Oneil DELENA Budge, MD;  Location: [facility];  Service: General;  Laterality: N/A;  converted to open at  0905   COLONOSCOPY WITH PROPOFOL  N/A 07/18/2020   prep was fair, one 4mm polyp (inflammatory) stool in descending colon, transverse and ascending, distal rectum and anal verge normal   ESOPHAGEAL DILATION  12/31/2018    Procedure: ESOPHAGEAL DILATION;  Surgeon: Golda Claudis PENNER, MD;  [facility];  Service: Endoscopy;;   ESOPHAGOGASTRODUODENOSCOPY (EGD) WITH PROPOFOL  N/A 08/24/2015   Procedure: ESOPHAGOGASTRODUODENOSCOPY (EGD) WITH PROPOFOL ;  Surgeon: Claudis PENNER Golda, MD;  [facility];  Service: Endoscopy;  Laterality: N/A;  1:10 - Ann to notify pt to arrive at 11:30   ESOPHAGOGASTRODUODENOSCOPY (EGD) WITH PROPOFOL  N/A 12/31/2018   normal, no abnormality to explain dysphagia, esophagus dilated.   ESOPHAGOGASTRODUODENOSCOPY (EGD) WITH PROPOFOL  N/A 05/03/2021   Procedure: ESOPHAGOGASTRODUODENOSCOPY (EGD) WITH PROPOFOL ;  Surgeon: Eartha Angelia Sieving, MD;  [facility];  Service: Gastroenterology;  Laterality: N/A;  1:20   FLEXIBLE SIGMOIDOSCOPY  07/17/2020   Procedure: FLEXIBLE SIGMOIDOSCOPY;  Surgeon: Eartha Angelia Sieving, MD;  [facility];  Service: Gastroenterology;;   POLYPECTOMY  07/18/2020   Procedure: POLYPECTOMY INTESTINAL;  Surgeon: Eartha Angelia Sieving, MD;  [facility];  Service: Gastroenterology;;  ascending colon polyp;    SAVORY DILATION  05/03/2021   Procedure: SAVORY DILATION;  Surgeon: Eartha Angelia Sieving, MD;  [facility];  Service: Gastroenterology;;   tooth removal  2019   all teeth removed   TUBAL LIGATION     X3   Patient Active Problem List   Diagnosis Date Noted   Abdominal pain 04/25/2024   Health  care maintenance 04/01/2024   Screening for colon cancer 04/01/2024   On sick leave from work 02/17/2024   Return to work evaluation 02/17/2024   Suicidal ideation 01/29/2024   Hospital discharge follow-up 01/08/2024   Frequency of urination 12/11/2023   Anemia 12/11/2023   Hypoglycemia 11/23/2023   Breast tenderness 10/23/2023   Soft tissue infection 07/24/2023   Tinnitus aurium, left 02/24/2023   Annual physical exam 02/24/2023   Bilateral calf pain 01/27/2023   Dysphagia 08/07/2022    Gastroparesis 08/07/2022   Trigger finger 03/05/2022   Encounter for general adult medical examination with abnormal findings 03/05/2022   Nausea 01/08/2022   Wheezing 01/08/2022   Migraine 10/31/2021   Depression, recurrent 10/31/2021   Great toe pain, right 10/03/2021   Chest pain 08/29/2021   Chronic tension-type headache, not intractable 08/29/2021   Encounter for examination following treatment at hospital 07/30/2021   Polyneuropathy 07/17/2021   Anxiety 07/01/2021   Diabetic autonomic neuropathy associated with type 2 diabetes mellitus (HCC) 07/01/2021   Need for influenza vaccination 07/01/2021   Need for varicella vaccine 07/01/2021   Constipation 04/23/2021   Pain of upper abdomen 04/23/2021   Peripheral polyneuropathy 03/28/2021   Lumbar radiculopathy 12/13/2020   Body mass index (BMI) 30.0-30.9, adult 10/01/2020   Moderate persistent asthma 09/28/2020   Disc displacement, lumbar 08/27/2020   Elevated blood-pressure reading, without diagnosis of hypertension 08/27/2020   Non-adherence to medical treatment 08/02/2020   Functional dyspepsia 06/07/2020   Vitamin D  deficiency 07/15/2019   Uncontrolled type 2 diabetes mellitus with hyperglycemia, with long-term current use of insulin  (HCC) 04/27/2019   Hypothyroidism 04/07/2019   Obesity (BMI 30.0-34.9) 04/07/2019   Dyslipidemia, goal LDL below 70 04/07/2019   Abnormal esophagram 11/23/2018   Chronic GERD 10/08/2015   IBS (irritable bowel syndrome) 10/08/2015   Essential hypertension, benign 08/22/2015   Schizoaffective disorder, depressive type (HCC) 02/08/2015   PTSD (post-traumatic stress disorder) 02/08/2015    PCP: Juanice Thomes SAUNDERS, FNP  REFERRING PROVIDER: Onesimo Oneil LABOR, MD  REFERRING DIAG: M54.50 (ICD-10-CM) - Lumbar pain  Rationale for Evaluation and Treatment: Rehabilitation  THERAPY DIAG:  Low back pain, unspecified back pain laterality, unspecified chronicity, unspecified whether sciatica  present  Radiculopathy, lumbar region  ONSET DATE: back surgery 09/06/2020   SUBJECTIVE:                                                                                                                                                                                           SUBJECTIVE STATEMENT: Pt reports she got an injection Friday and her pain is much better.  States only central LBP at 5/10 and  no radiculopathy.  states she is suppose to get MRI for her Rt shoulder but waiting for insurance approval.    Evaluation:  Pain for years; had surgery in 2022; felt better initially but has now gotten worse.  Went to Dr. Onesimo first and told her she should follow up with back surgeon; referred to PT as well.  Dr. Gillie ordered MRI of neck and back. MRI done Friday.   Waiting to hear back from [facility]'s office for MRI results; laying and standing prolonged periods of time bother her.  Her hips feel weak  PERTINENT HISTORY:  Anxiety, asthma, schizophrenia, DM  PAIN:  Are you having pain? Yes: NPRS scale: 9/10 Pain location: back, lower ribs down to glutes Pain description: burning Aggravating factors: lay down Relieving factors: Biofreeze, Tylenol   PRECAUTIONS: None  WEIGHT BEARING RESTRICTIONS: No  FALLS:  Has patient fallen in last 6 months? No   OCCUPATION: not working  PLOF: Independent  PATIENT GOALS: get my strength back; feel like my hips are weak  NEXT MD VISIT: PRN  OBJECTIVE:  Note: Objective measures were completed at Evaluation unless otherwise noted.  DIAGNOSTIC FINDINGS:  MR CERVICAL SPINE WITHOUT CONTRAST   CLINICAL HISTORY: Neck pain with right arm pain/numbness   TECHNIQUE: Multiplanar, multisequence MRI of the cervical spine was performed without intravenous contrast.   COMPARISON: No prior study.   FINDINGS: Mild straightening of the cervical alignment. Mild disc height loss C2-C3, C3/4. Multilevel minimal endplate spurring. No fracture or  subluxation. No paraspinal muscle edema.   Cervical cord is normal in course, caliber, and signal intensity. Cervicomedullary junction is normal.     C2-C3: No disc bulge. No facet hypertrophy. Patent central canal and neural foramina.   C3-C4: Mild posterior disc osteophyte ridge and mild uncovertebral hypertrophy. Small central protrusion. Mild flattening of the ventral thecal sac but the central canal measuring 6 to 7 mm anteroposteriorly (axial T2 image 16). Moderate left foraminal narrowing. Mild right foraminal narrowing.   C4-C5: Uncovertebral hypertrophy and posterior disc osteophyte ridging, asymmetrically worse on the right. No facet hypertrophy. Flattening the ventral thecal sac. Central canal measures 6 to 7 mm anteroposteriorly. Severe right and moderate left foraminal narrowing.   C5-C6: No disc bulge. No facet hypertrophy. Patent central canal and neural foramina.   C6-C7: Mild left uncovertebral hypertrophy. Mild left foraminal narrowing. Patent central canal and right foramen. No facet hypertrophy.   C7-T1: No disc bulge. No facet hypertrophy. Patent central canal and neural foramina.   IMPRESSION: 1. Mild straightening of the cervical alignment without fracture or subluxation.   2. C3/4: Moderate canal stenosis and moderate left foraminal narrowing. Mild right foraminal narrowing.   3. C4/5: Moderate canal stenosis. Severe right and moderate left foraminal narrowing.   4.  Full level by level details above.   Electronically signed by: Margretta Blanch MD 06/05/2024 02:22 PM EST RP Workstation: [id number]    MR LUMBAR SPINE WITHOUT THEN WITH IV CONTRAST   HISTORY: Low back pain. .   TECHNIQUE: Multiplanar, multisequence MR of the lumbar spine was performed before and after 7.5 mL Gadavist  intravenous contrast.   COMPARISON: Oct 17, 2020.   FINDINGS: Stable alignment with transitional anatomy at the lumbosacral junction. Lowest fully formed vertebral  body designated L5. Progression of now severe disc height loss L4/5 and increasing type I Modic endplate changes with marginal osteophyte formation. No significant subluxation. Multilevel disc dehydration at the remaining lumbar levels without significant disc height loss. No fracture. Conus terminates opposite  L1/2.   T11/12: Right foraminal protrusion contributing to moderate right foraminal narrowing. Patent central canal and left foramen. Mild facet hypertrophy.   T12/L1, L1/2: No disc bulge or significant facet hypertrophy. Patent central canal and neuroforamina.   L2/3: No disc bulge. Minor facet hypertrophy. Patent central canal and right foramen.   L3/4: Minimal annular bulge. Minor facet hypertrophy. Patent central canal. Patent neural foramina.   L4/5: Right hemilaminotomy. Circumferential disc osteophyte complex with a broad central, right paracentral protrusion measuring up to 5 to 6 mm anteroposteriorly (axial T2 image 30). Minor facet hypertrophy with ligamentous thickening. Moderate central canal stenosis. Moderate right subarticular stenosis. Moderate right and mild left foraminal narrowing.   L5/S1: Minor annular bulge. Moderate facet hypertrophy. Patent central canal. Mild bilateral foraminal narrowing, similar to prior.     IMPRESSION: 1.  Stable alignment with transitional anatomy at the lumbosacral junction.   2. L4/5: Progression of disc height loss with increasing type I Modic endplate changes. Broad central, right paracentral protrusion with moderate central canal stenosis, moderate right subarticular stenosis, and moderate right foraminal narrowing.   3. T11/12: Right foraminal protrusion contributing to moderate right foraminal narrowing.   4.  Full level by level details above.   Electronically signed by: Margretta Blanch MD 06/05/2024 02:33 PM EST RP Workstation: [id number]    PATIENT SURVEYS:  Modified Oswestry:  MODIFIED OSWESTRY DISABILITY  SCALE  Date:  06/06/2024  Total 27/50; 54%   Interpretation of scores: Score Category Description  0-20% Minimal Disability The patient can cope with most living activities. Usually no treatment is indicated apart from advice on lifting, sitting and exercise  21-40% Moderate Disability The patient experiences more pain and difficulty with sitting, lifting and standing. Travel and social life are more difficult and they may be disabled from work. Personal care, sexual activity and sleeping are not grossly affected, and the patient can usually be managed by conservative means  41-60% Severe Disability Pain remains the main problem in this group, but activities of daily living are affected. These patients require a detailed investigation  61-80% Crippled Back pain impinges on all aspects of the patients life. Positive intervention is required  81-100% Bed-bound These patients are either bed-bound or exaggerating their symptoms  Bluford FORBES Zoe DELENA Karon DELENA, et al. Surgery versus conservative management of stable thoracolumbar fracture: the PRESTO feasibility RCT. [place] ([place]): [employer]; 2021 Nov. [facility] Technology Assessment, No. 25.62.) Appendix 3, Oswestry Disability Index category descriptors. Available from: Findjewelers.cz  Minimally Clinically Important Difference (MCID) = 12.8%  COGNITION: Overall cognitive status: Within functional limits for tasks assessed     MUSCLE LENGTH: Hamstrings:   POSTURE: rounded shoulders, forward head, and increased lumbar lordosis  PALPATION: Grossly tender lumbar spine  LUMBAR ROM: * painful  AROM eval  Flexion Fingertips to 2 past distal pole of patella*  Extension 40% available *  Right lateral flexion Fingertips to 5 above knee joint line  Left lateral flexion Fingertips to 5 above knee joint line  Right rotation   Left rotation    (Blank rows = not tested)   LOWER EXTREMITY MMT:    MMT  Right eval Left eval  Hip flexion 4 4  Hip extension Prone 3+ Prone 3+  Hip abduction    Hip adduction    Hip internal rotation    Hip external rotation    Knee flexion 4 4  Knee extension 4 4+  Ankle dorsiflexion 4 4  Ankle plantarflexion    Ankle  inversion    Ankle eversion     (Blank rows = not tested)   FUNCTIONAL TESTS:  5 times sit to stand: 27.23 sec using hands to push up to standing  GAIT: Distance walked: 50 ft in clinic Assistive device utilized: None Level of assistance: Modified independence Comments: decreased gait speed  TREATMENT DATE: 06/29/24 Supine: decompression 1-5, 5X5 each  TrA contraction 10X5  Bridge 10X each  SLR 10 X each with core stab Log rolling/correct transferring  06/15/2024 Goal review Standing :  lumbar extension 10X Prone:  testing of hamstrings and glute MMT  POE 2 minutes  Heelsqueezes 10x5 Supine:  decompression 1-5, 5X each  TrA contraction 10X5  Bridge 10X each  SLR 10 X each with core stab  06/06/2024 physical therapy evaluation and HEP instruction                                                                                                                                 PATIENT EDUCATION:  Education details: Patient educated on exam findings, POC, scope of PT, HEP, and what to expect next visit. Person educated: Patient Education method: Explanation, Demonstration, and Handouts Education comprehension: verbalized understanding, returned demonstration, verbal cues required, and tactile cues required  HOME EXERCISE PROGRAM: Access Code: [email or web address] URL: [email or web address] Date: 06/06/2024 Prepared by: [facility] - Rehab Exercises - Seated Transversus Abdominis Bracing  - 2 x daily - 7 x weekly - 1 sets - 10 reps - 5 sec hold - Seated Hamstring Stretch  - 2 x daily - 7 x weekly - 1 sets - 5 reps  06/15/24:  decompression 1-5, 5X5 each  ASSESSMENT:  CLINICAL IMPRESSION: Overall improved, doing the  exercises at home.  Increased time taken with exercises today as pt with difficulty recalling exercises and performing correctly requiring cues.  No new exercises added this session.  Did work on log rolling technique with patient.  She reported no change in symptoms at end of session.   Patient will benefit from skilled physical therapy services to address these deficits to reduce pain and improve level of function with ADLs and functional mobility tasks.   OBJECTIVE IMPAIRMENTS: Abnormal gait, decreased activity tolerance, decreased ROM, decreased strength, increased fascial restrictions, impaired perceived functional ability, and pain.   ACTIVITY LIMITATIONS: carrying, lifting, bending, sitting, standing, squatting, sleeping, and locomotion level  PARTICIPATION LIMITATIONS: meal prep, cleaning, laundry, shopping, and community activity  PERSONAL FACTORS: 1 comorbidity: DM are also affecting patient's functional outcome.   REHAB POTENTIAL: Good  CLINICAL DECISION MAKING: Evolving/moderate complexity  EVALUATION COMPLEXITY: Moderate   GOALS: Goals reviewed with patient? Yes  SHORT TERM GOALS: Target date: 06/27/2024  patient will be independent with initial HEP and compliant with HEP 3-4 times a week   Baseline: Goal status: IN PROGRESS  2.  Patient will report 50% improvement overall  Baseline:  Goal status: IN PROGRESS   LONG TERM GOALS: Target date: 07/22/2024  Patient  will be independent in self management strategies to improve quality of life and functional outcomes.  Baseline:  Goal status: IN PROGRESS  2.  Patient will report 70% improvement overall  Baseline:  Goal status: IN PROGRESS  3.  Patient will improve Modified Oswestry score by 5 points to demonstrate improved perceived function  Baseline: 27/50 Goal status: IN PROGRESS  4.  Patient will improve 5 times sit to stand score to 17 sec or less to demonstrate improved functional mobility and increased  leg strength.    Baseline: 27.23 sec using hands to push up Goal status: IN PROGRESS  5.   Patient will increase bilateral leg MMT's to 4+ to 5/5 to allow navigation of steps without gait deviation or loss of balance  Baseline: see above Goal status: IN PROGRESS   PLAN:  PT FREQUENCY: 1-2x/week  PT DURATION: 6 weeks  PLANNED INTERVENTIONS: [id number]- PT Re-evaluation, 97110-Therapeutic exercises, 97530- Therapeutic activity, 97112- Neuromuscular re-education, 97535- Self Care, [id number]- Manual therapy, [id number]- Gait training, [phone]- Orthotic Fit/training, [phone]- Canalith repositioning, [id number]- Aquatic Therapy, 97760- Splinting, [id number]- Wound care (first 20 sq cm), 97598- Wound care (each additional 20 sq cm)Patient/Family education, Balance training, Stair training, Taping, Dry Needling, Joint mobilization, Joint manipulation, Spinal manipulation, Spinal mobilization, Scar mobilization, and DME instructions. SABRA  PLAN FOR NEXT SESSION: continue with decompression exercise, postural strengthening,  core strengthening, and spinal mobility.  Greig KATHEE Fuse, PTA/CLT [facility] Ph: [phone]  9:46 AM, 06/29/24    "

## 2024-07-01 ENCOUNTER — Encounter: Payer: MEDICAID | Admitting: Dietician

## 2024-07-06 ENCOUNTER — Ambulatory Visit (HOSPITAL_COMMUNITY): Payer: MEDICAID

## 2024-07-13 ENCOUNTER — Ambulatory Visit (HOSPITAL_COMMUNITY): Payer: MEDICAID | Admitting: Physical Therapy

## 2024-07-15 ENCOUNTER — Encounter: Payer: MEDICAID | Admitting: Dietician

## 2024-07-20 ENCOUNTER — Ambulatory Visit (HOSPITAL_COMMUNITY): Payer: MEDICAID

## 2024-07-20 ENCOUNTER — Institutional Professional Consult (permissible substitution) (INDEPENDENT_AMBULATORY_CARE_PROVIDER_SITE_OTHER): Payer: MEDICAID | Admitting: Otolaryngology

## 2024-07-22 ENCOUNTER — Other Ambulatory Visit: Payer: MEDICAID

## 2024-08-05 ENCOUNTER — Ambulatory Visit: Payer: Self-pay | Admitting: Nurse Practitioner

## 2024-09-02 ENCOUNTER — Ambulatory Visit: Admitting: Cardiology
